# Patient Record
Sex: Male | Born: 1940 | Race: Black or African American | Hispanic: No | Smoking: Former smoker
Health system: Southern US, Community
[De-identification: ages and names within clinical notes are randomized; demographics above are authoritative.]

## PROBLEM LIST (undated history)

## (undated) DIAGNOSIS — Z9889 Other specified postprocedural states: Secondary | ICD-10-CM

## (undated) DIAGNOSIS — I509 Heart failure, unspecified: Secondary | ICD-10-CM

## (undated) DIAGNOSIS — Z95810 Presence of automatic (implantable) cardiac defibrillator: Secondary | ICD-10-CM

## (undated) DIAGNOSIS — N281 Cyst of kidney, acquired: Secondary | ICD-10-CM

## (undated) DIAGNOSIS — R112 Nausea with vomiting, unspecified: Secondary | ICD-10-CM

## (undated) DIAGNOSIS — M109 Gout, unspecified: Secondary | ICD-10-CM

## (undated) DIAGNOSIS — F419 Anxiety disorder, unspecified: Secondary | ICD-10-CM

## (undated) DIAGNOSIS — E0590 Thyrotoxicosis, unspecified without thyrotoxic crisis or storm: Secondary | ICD-10-CM

## (undated) DIAGNOSIS — K760 Fatty (change of) liver, not elsewhere classified: Secondary | ICD-10-CM

## (undated) DIAGNOSIS — N289 Disorder of kidney and ureter, unspecified: Secondary | ICD-10-CM

## (undated) DIAGNOSIS — I10 Essential (primary) hypertension: Secondary | ICD-10-CM

## (undated) DIAGNOSIS — E785 Hyperlipidemia, unspecified: Secondary | ICD-10-CM

## (undated) HISTORY — DX: Thyrotoxicosis, unspecified without thyrotoxic crisis or storm: E05.90

## (undated) HISTORY — DX: Gout, unspecified: M10.9

## (undated) HISTORY — DX: Essential (primary) hypertension: I10

## (undated) HISTORY — PX: CARDIAC DEFIBRILLATOR PLACEMENT: SHX171

## (undated) HISTORY — DX: Hyperlipidemia, unspecified: E78.5

## (undated) HISTORY — DX: Cyst of kidney, acquired: N28.1

## (undated) HISTORY — PX: OTHER SURGICAL HISTORY: SHX169

## (undated) HISTORY — DX: Fatty (change of) liver, not elsewhere classified: K76.0

## (undated) HISTORY — DX: Disorder of kidney and ureter, unspecified: N28.9

---

## 2008-06-03 ENCOUNTER — Encounter: Payer: Self-pay | Admitting: Family Medicine

## 2008-06-07 ENCOUNTER — Encounter: Payer: Self-pay | Admitting: Family Medicine

## 2009-08-17 ENCOUNTER — Encounter: Payer: Self-pay | Admitting: Family Medicine

## 2009-08-17 LAB — CONVERTED CEMR LAB
ALT: 17 units/L
Alkaline Phosphatase: 87 units/L
Basophils Absolute: 0 10*3/uL
Creatinine, Ser: 1.92 mg/dL
Eosinophils Absolute: 0.2 10*3/uL
HCT: 41.3 %
Hemoglobin: 12.9 g/dL
Hgb A1c MFr Bld: 18.8 %
LDL Cholesterol: 147 mg/dL
Monocytes Absolute: 0.3 10*3/uL
PSA: 0.29 ng/mL
Platelets: 160 10*3/uL
Potassium: 4.5 meq/L
RBC: 4.89 M/uL
T3, Total: 2.2 ng/dL
TSH: 2.852 microintl units/mL
Total Bilirubin: 0.4 mg/dL
Total Protein: 8.4 g/dL
Uric Acid, Serum: 9.5 mg/dL
VLDL: 78 mg/dL
WBC: 5.2 10*3/uL

## 2009-10-10 ENCOUNTER — Encounter: Payer: Self-pay | Admitting: Family Medicine

## 2009-10-10 LAB — CONVERTED CEMR LAB
BUN: 64 mg/dL
Calcium: 9 mg/dL
Cholesterol: 183 mg/dL
Creatinine, Ser: 2.46 mg/dL
Glucose, Bld: 97 mg/dL
Hgb A1c MFr Bld: 12.3 %

## 2010-03-07 ENCOUNTER — Ambulatory Visit: Payer: Self-pay | Admitting: Family Medicine

## 2010-03-07 DIAGNOSIS — M109 Gout, unspecified: Secondary | ICD-10-CM | POA: Insufficient documentation

## 2010-03-07 DIAGNOSIS — E1122 Type 2 diabetes mellitus with diabetic chronic kidney disease: Secondary | ICD-10-CM | POA: Insufficient documentation

## 2010-03-07 DIAGNOSIS — E1169 Type 2 diabetes mellitus with other specified complication: Secondary | ICD-10-CM | POA: Insufficient documentation

## 2010-03-07 DIAGNOSIS — I10 Essential (primary) hypertension: Secondary | ICD-10-CM | POA: Insufficient documentation

## 2010-03-07 DIAGNOSIS — E1129 Type 2 diabetes mellitus with other diabetic kidney complication: Secondary | ICD-10-CM

## 2010-03-07 DIAGNOSIS — E785 Hyperlipidemia, unspecified: Secondary | ICD-10-CM | POA: Insufficient documentation

## 2010-03-07 DIAGNOSIS — J309 Allergic rhinitis, unspecified: Secondary | ICD-10-CM | POA: Insufficient documentation

## 2010-03-07 DIAGNOSIS — Z95810 Presence of automatic (implantable) cardiac defibrillator: Secondary | ICD-10-CM | POA: Insufficient documentation

## 2010-03-15 ENCOUNTER — Encounter: Payer: Self-pay | Admitting: Family Medicine

## 2010-03-30 ENCOUNTER — Encounter (INDEPENDENT_AMBULATORY_CARE_PROVIDER_SITE_OTHER): Payer: Self-pay | Admitting: *Deleted

## 2010-04-04 ENCOUNTER — Ambulatory Visit: Payer: Self-pay | Admitting: Family Medicine

## 2010-04-04 ENCOUNTER — Encounter: Payer: Self-pay | Admitting: Family Medicine

## 2010-04-04 DIAGNOSIS — N508 Other specified disorders of male genital organs: Secondary | ICD-10-CM | POA: Insufficient documentation

## 2010-04-04 DIAGNOSIS — N184 Chronic kidney disease, stage 4 (severe): Secondary | ICD-10-CM | POA: Insufficient documentation

## 2010-04-05 ENCOUNTER — Telehealth (INDEPENDENT_AMBULATORY_CARE_PROVIDER_SITE_OTHER): Payer: Self-pay | Admitting: *Deleted

## 2010-04-05 LAB — CONVERTED CEMR LAB
Basophils Absolute: 0 10*3/uL (ref 0.0–0.1)
Bilirubin, Direct: 0 mg/dL (ref 0.0–0.3)
CO2: 19 meq/L (ref 19–32)
Calcium: 9.3 mg/dL (ref 8.4–10.5)
Cholesterol: 168 mg/dL (ref 0–200)
Creatinine, Ser: 2.5 mg/dL — ABNORMAL HIGH (ref 0.4–1.5)
Eosinophils Absolute: 0.5 10*3/uL (ref 0.0–0.7)
GFR calc non Af Amer: 27.19 mL/min (ref >60)
Glucose, Bld: 180 mg/dL — ABNORMAL HIGH (ref 70–99)
HDL: 29.2 mg/dL — ABNORMAL LOW (ref >39.00)
Hgb A1c MFr Bld: 8.7 % — ABNORMAL HIGH (ref 4.6–6.5)
Iron: 66 ug/dL (ref 42–165)
LDL Cholesterol: 104 mg/dL — ABNORMAL HIGH (ref 0–99)
MCHC: 32.4 g/dL (ref 30.0–36.0)
MCV: 83.2 fL (ref 78.0–100.0)
Monocytes Absolute: 0.6 10*3/uL (ref 0.1–1.0)
Neutrophils Relative %: 62.8 % (ref 43.0–77.0)
PSA: 0.22 ng/mL (ref 0.10–4.00)
Platelets: 153 10*3/uL (ref 150.0–400.0)
Saturation Ratios: 21.6 % (ref 20.0–50.0)
Total Bilirubin: 0.4 mg/dL (ref 0.3–1.2)
Total CHOL/HDL Ratio: 6
Uric Acid, Serum: 11.3 mg/dL — ABNORMAL HIGH (ref 4.0–7.8)
VLDL: 34.4 mg/dL (ref 0.0–40.0)

## 2010-04-13 ENCOUNTER — Encounter: Payer: Self-pay | Admitting: Family Medicine

## 2010-05-16 ENCOUNTER — Encounter: Payer: Self-pay | Admitting: Family Medicine

## 2010-05-17 ENCOUNTER — Encounter: Payer: Self-pay | Admitting: Family Medicine

## 2010-06-07 ENCOUNTER — Encounter: Payer: Self-pay | Admitting: Internal Medicine

## 2010-06-07 ENCOUNTER — Ambulatory Visit: Payer: Self-pay | Admitting: Internal Medicine

## 2010-06-07 DIAGNOSIS — I5022 Chronic systolic (congestive) heart failure: Secondary | ICD-10-CM | POA: Insufficient documentation

## 2010-06-07 DIAGNOSIS — I429 Cardiomyopathy, unspecified: Secondary | ICD-10-CM | POA: Insufficient documentation

## 2010-06-08 ENCOUNTER — Telehealth (INDEPENDENT_AMBULATORY_CARE_PROVIDER_SITE_OTHER): Payer: Self-pay | Admitting: *Deleted

## 2010-07-05 ENCOUNTER — Ambulatory Visit: Admit: 2010-07-05 | Payer: Self-pay | Admitting: Family Medicine

## 2010-07-05 ENCOUNTER — Encounter (INDEPENDENT_AMBULATORY_CARE_PROVIDER_SITE_OTHER): Payer: Self-pay | Admitting: *Deleted

## 2010-07-06 ENCOUNTER — Encounter (INDEPENDENT_AMBULATORY_CARE_PROVIDER_SITE_OTHER): Payer: Self-pay | Admitting: *Deleted

## 2010-07-06 ENCOUNTER — Encounter: Payer: Self-pay | Admitting: Family Medicine

## 2010-07-06 LAB — CONVERTED CEMR LAB
BUN: 49 mg/dL
PTH: 87 pg/mL
Potassium, serum: 5.1 mmol/L
Sodium, serum: 134 mmol/L

## 2010-07-12 ENCOUNTER — Ambulatory Visit: Admit: 2010-07-12 | Payer: Self-pay | Admitting: Family Medicine

## 2010-07-17 NOTE — Progress Notes (Signed)
Summary: labs  Phone Note Outgoing Call   Call placed by: Malachi Bonds CMA,  April 05, 2010 2:42 PM Call placed to: Patient Summary of Call: A1C has improved from previous.  still not ideal, goal is <7.  needs to increase Lantus to 25 units two times a day.  can increase dose by 2 units every 2 days until fasting sugar is <120.  once sugar is <120 should maintain that dose.  Kidney fxn is stable.  If pt is taking Colcrys daily for gout should decrease to 1/2 tab daily.  if only taking as needed for sxs no adjustment is needed  pt w/ mild anemia- may be related to his poor kidney fxn.  please add iron panel to labs.  LDL goal is <70 given DM, should switch to Lipitor 40mg  in November when the medication goes generic.  can continue Simvastatin until then.  Follow-up for Phone Call        spoke w/ patient aware of labs and change in medication will mail copy of labs ........Marland KitchenMalachi Bonds CMA  April 05, 2010 2:44 PM

## 2010-07-17 NOTE — Assessment & Plan Note (Signed)
Summary: YEARLY EXAM  AND FASTING LABS///SPH  Flu Vaccine Consent Questions     Do you have a history of severe allergic reactions to this vaccine? no    Any prior history of allergic reactions to egg and/or gelatin? no    Do you have a sensitivity to the preservative Thimersol? no    Do you have a past history of Guillan-Barre Syndrome? no    Do you currently have an acute febrile illness? no    Have you ever had a severe reaction to latex? no    Vaccine information given and explained to patient? yes    Are you currently pregnant? no    Lot Number:[id number]   Exp Date:12/15/2010   Site Given  Right Deltoid IM    Vital Signs:  Patient profile:   70 year old male Height:      68 inches Weight:      191 pounds BMI:     29.15 Pulse rate:   64 / minute BP sitting:   112 / 64  (right arm)  Vitals Entered By: Malachi Bonds CMA (April 04, 2010 8:05 AM) CC: YEARLY EXAM AND LABS   History of Present Illness: 70 yo man here today for CPE.  Here for Medicare AWV:  1.   Risk factors based on Past M, S, F history: DM- last A1C from 4/11 was 12.3.  currently on Januvia (reduced dose due to kidney failure) and Lantus.   has upcoming appt w/ Dr Bing Plume.  CBG last night was 108, yesterday AM was 98.  denies symptomatic lows.  no CP, SOB, HAs, visual changes, edema, has intermittant numbness of L pinky finger. renal insufficiency- last Cr was 2.46.  on ACE.  has never seen renal- this was discussed but then pt moved. HTN- well controlled.  on Lisinopril, Torsemide, Atenolol.  asymptomatic Hyperlipidemia- on Simvastatin 40mg .  due for labs.  no N/V. abd pain, myalgias  2.   Physical Activities: walking regularly 3.   Depression/mood: reports mood is still good, 'i get down some days' (wife recently had stroke) 4.   Hearing: normal to whispered voice at 6 ft 5.   ADL's: independent 6.   Fall Risk: low risk, gait normal 7.   Home Safety: safe at home, lives w/ son 25.   Height, weight,  &visual acuity: see vitals, vision corrected to 20/20 w/ glasses 9.   Counseling: provided on health maintainence, exercise, ADA diet 10.   Labs ordered based on risk factors:  11.           Referral Coordination: UTD on colonoscopy, declines a Pneumovax, would like a flu shot 12.           Care Plan: see A&P 13.           Cognitive Assessment: normal thought process, memory intact, normally interactive  Preventive Screening-Counseling & Management  Alcohol-Tobacco     Alcohol drinks/day: 0     Smoking Status: quit  Caffeine-Diet-Exercise     Does Patient Exercise: no      Sexual History:  currently monogamous.        Drug Use:  never.    Current Medications (verified): 1)  Flonase 50 Mcg/act Susp (Fluticasone Propionate) .... As Directed 2)  Torsemide 20 Mg Tabs (Torsemide) .Marland Kitchen.. 1 By Mouth Qd 3)  Viagra 100 Mg Tabs (Sildenafil Citrate) .Marland Kitchen.. 1 By Mouth 1 Hr Before Sex As Needed 4)  Extra Strength Acetaminophen 500 Mg Caps (Acetaminophen) .... Prn  5)  Lisinopril 10 Mg Tabs (Lisinopril) .Marland Kitchen.. 1 By Mouth Qd 6)  Colcrys 0.6 Mg Tabs (Colchicine) .... 2 By Mouth At Onset of Gout Attack 7)  Atenolol 50 Mg Tabs (Atenolol) .... 1.5 Tabs By Mouth Qd 8)  Januvia 50 Mg Tabs (Sitagliptin Phosphate) .Marland Kitchen.. 1 By Mouth Qd 9)  Simvastatin 40 Mg Tabs (Simvastatin) .Marland Kitchen.. 1 By Mouth Qhs 10)  Freestyle Lancets  Misc (Lancets) .... Use As Directed Two Times A Day 11)  Lantus Solostar 100 Unit/ml Soln (Insulin Glargine) .... 23u Qam and 23u Qpm  Allergies (verified): 1)  ! Sulfa  Past History:  Past Medical History: DM HTN Hyperlipidemia Gout Pacemaker ED Renal Insufficiency PMH-FH-SH reviewed-no changes except otherwise noted  Social History: Sexual History:  currently monogamous Drug Use:  never  Review of Systems       The patient complains of anorexia.  The patient denies fever, weight loss, weight gain, vision loss, decreased hearing, hoarseness, chest pain, syncope, dyspnea on  exertion, peripheral edema, prolonged cough, headaches, abdominal pain, melena, hematochezia, severe indigestion/heartburn, hematuria, suspicious skin lesions, depression, abnormal bleeding, enlarged lymph nodes, and testicular masses.         anorexia due to stress  Physical Exam  General:  Well-developed,well-nourished,in no acute distress; alert,appropriate and cooperative throughout examination Head:  NCAT Eyes:  PERRL, EOMI, fundoscopic exam deferred to ophtho Ears:  External ear exam shows no significant lesions or deformities.  Otoscopic examination reveals clear canals, tympanic membranes are intact bilaterally without bulging, retraction, inflammation or discharge. Hearing is grossly normal bilaterally. Nose:  mild congestion Mouth:  Oral mucosa and oropharynx without lesions or exudates. Neck:  No deformities, masses, or tenderness noted. Lungs:  Normal respiratory effort, chest expands symmetrically. Lungs are clear to auscultation, no crackles or wheezes. Heart:  reg S1/S2 Abdomen:  soft, NT/ND, +BS Rectal:  No external abnormalities noted. Normal sphincter tone. No rectal masses or tenderness. Genitalia:  uncircumcised and no cutaneous lesions.  large R testicular mass, firm, nontender.  pt reports this has been present for 'years'.  has never had this evaluated. Prostate:  mildly enlarged but smooth Pulses:  +2 radial, DP/PT Extremities:  No clubbing, cyanosis, edema, or deformity noted with normal full range of motion of all joints.   Neurologic:  No cranial nerve deficits noted. Station and gait are normal. Plantar reflexes are down-going bilaterally. DTRs are symmetrical throughout. Sensory, motor and coordinative functions appear intact. Skin:  Intact without suspicious lesions or rashes Cervical Nodes:  No lymphadenopathy noted Inguinal Nodes:  No significant adenopathy Psych:  Cognition and judgment appear intact. Alert and cooperative with normal attention span and  concentration. No apparent delusions, illusions, hallucinations  Diabetes Management Exam:    Foot Exam (with socks and/or shoes not present):       Sensory-Pinprick/Light touch:          Left medial foot (L-4): normal          Left dorsal foot (L-5): normal          Left lateral foot (S-1): normal          Right medial foot (L-4): normal          Right dorsal foot (L-5): normal          Right lateral foot (S-1): normal       Sensory-Monofilament:          Left foot: normal          Right foot: normal  Inspection:          Left foot: normal          Right foot: normal       Nails:          Left foot: too long          Right foot: too long   Impression & Recommendations:  Problem # 1:  PHYSICAL EXAMINATION (ICD-V70.0) Assessment New pt's PE WNL w/ exception of testicular mass (see below).  UTD on colonoscopy.  refusing Pneumovax.  flu shot given. Orders: TLB-PSA (Prostate Specific Antigen) (84153-PSA) Specimen Handling (99000) Medicare -1st Annual Wellness Visit [phone]) DRE [id number]) Prostate / PSA (Medicare) (G0103)  Problem # 2:  DIABETES-TYPE 2 (ICD-250.00) Assessment: Unchanged according to labs received from pt's previous MD this is poorly controlled.  will check labs today and adjust meds as needed.  has upcoming eye exam. His updated medication list for this problem includes:    Lisinopril 10 Mg Tabs (Lisinopril) .Marland Kitchen... 1 by mouth qd    Januvia 50 Mg Tabs (Sitagliptin phosphate) .Marland Kitchen... 1 by mouth qd    Lantus Solostar 100 Unit/ml Soln (Insulin glargine) .Marland Kitchen... 23u qam and 23u qpm  Orders: Venipuncture [id number]) TLB-A1C / Hgb A1C (Glycohemoglobin) (83036-A1C) TLB-BMP (Basic Metabolic Panel-BMET) (99991111) TLB-TSH (Thyroid Stimulating Hormone) (84443-TSH) Specimen Handling (99000)  Problem # 3:  RENAL INSUFFICIENCY (ICD-588.9) Assessment: New pt's Cr from last labs were 2.46.  had discussed seeing renal w/ his previous MD but then wife had stroke and 'things  fell through'.  will check labs and refer. Orders: Nephrology Referral (Nephro)  Problem # 4:  HYPERTENSION, BENIGN ESSENTIAL (ICD-401.1) Assessment: Unchanged well controlled.  asymptomatic.  no changes at this time. His updated medication list for this problem includes:    Torsemide 20 Mg Tabs (Torsemide) .Marland Kitchen... 1 by mouth qd    Lisinopril 10 Mg Tabs (Lisinopril) .Marland Kitchen... 1 by mouth qd    Atenolol 50 Mg Tabs (Atenolol) .Marland Kitchen... 1.5 tabs by mouth qd  Orders: TLB-CBC Platelet - w/Differential (85025-CBCD)  Problem # 5:  HYPERLIPIDEMIA (ICD-272.4) Assessment: Unchanged tolerating statin.  check labs and make adjustments as needed. His updated medication list for this problem includes:    Simvastatin 40 Mg Tabs (Simvastatin) .Marland Kitchen... 1 by mouth qhs  Orders: TLB-Lipid Panel (80061-LIPID) TLB-Hepatic/Liver Function Pnl (80076-HEPATIC) Specimen Handling (99000)  Problem # 6:  TESTICULAR MASS [id number]) Assessment: New pt reports this has been present for 'years'.  has never had w/u.  will refer to urology. Orders: Urology Referral (Urology)  Complete Medication List: 1)  Flonase 50 Mcg/act Susp (Fluticasone propionate) .... As directed 2)  Torsemide 20 Mg Tabs (Torsemide) .Marland Kitchen.. 1 by mouth qd 3)  Viagra 100 Mg Tabs (Sildenafil citrate) .Marland Kitchen.. 1 by mouth 1 hr before sex as needed 4)  Extra Strength Acetaminophen 500 Mg Caps (Acetaminophen) .... Prn 5)  Lisinopril 10 Mg Tabs (Lisinopril) .Marland Kitchen.. 1 by mouth qd 6)  Colcrys 0.6 Mg Tabs (Colchicine) .... 2 by mouth at onset of gout attack 7)  Atenolol 50 Mg Tabs (Atenolol) .... 1.5 tabs by mouth qd 8)  Januvia 50 Mg Tabs (Sitagliptin phosphate) .Marland Kitchen.. 1 by mouth qd 9)  Simvastatin 40 Mg Tabs (Simvastatin) .Marland Kitchen.. 1 by mouth qhs 10)  Freestyle Lancets Misc (Lancets) .... Use as directed two times a day 11)  Lantus Solostar 100 Unit/ml Soln (Insulin glargine) .... 23u qam and 23u qpm  Other Orders: TLB-Uric Acid, Blood (84550-URIC) Flu Vaccine 48yrs +  MEDICARE PATIENTS [id number]) Administration  Flu vaccine - MCR [phone])  Patient Instructions: 1)  Follow up in 3 months for your regular diabetes check 2)  We'll notify you of your lab results 3)  Someone will call you with your Urology appt to evaluate the lump in your testicle 4)  We'll set up an appt with the Kidney doctors for you since this was talked about in the past 5)  We'll make any adjustments to your medicines based on your labs 6)  Call with any questions or concerns 7)  Hang in there!!!   Orders Added: 1)  Venipuncture [id number] 2)  TLB-A1C / Hgb A1C (Glycohemoglobin) [83036-A1C] 3)  TLB-BMP (Basic Metabolic Panel-BMET) 123456 4)  TLB-TSH (Thyroid Stimulating Hormone) [84443-TSH] 5)  TLB-CBC Platelet - w/Differential [85025-CBCD] 6)  TLB-Lipid Panel [80061-LIPID] 7)  TLB-Hepatic/Liver Function Pnl [80076-HEPATIC] 8)  TLB-PSA (Prostate Specific Antigen) [84153-PSA] 9)  TLB-Uric Acid, Blood [84550-URIC] 10)  Urology Referral [Urology] 11)  Nephrology Referral [Nephro] 12)  Flu Vaccine 22yrs + MEDICARE PATIENTS [Q2039] 13)  Administration Flu vaccine - MCR [id number] 14)  Specimen Handling [99000] 15)  Medicare -1st Annual Wellness Visit [id number] 16)  Est. Patient Level IV [id number] 17)  DRE [id number] 18)  Prostate / PSA (Medicare) [id number]

## 2010-07-17 NOTE — Assessment & Plan Note (Signed)
Summary: NEW PT TO ESTAB///SPH   Vital Signs:  Patient profile:   69 year old male Height:      68 inches (172.72 cm) Weight:      196.38 pounds (89.26 kg) BMI:     29.97 Temp:     97.3 degrees F (36.28 degrees C) oral BP sitting:   126 / 70  (left arm) Cuff size:   regular  Vitals Entered By: Ernestene Mention CMA (March 07, 2010 10:22 AM) CC: NP est care, needs cpx./kb Is Patient Diabetic? Yes Pain Assessment Patient in pain? no        History of Present Illness: 70 yo man here today to establish care.  previous MD- Dr Hilton Sinclair, [place], [place].  recently moved in w/ son, [name].  DM- taking Januvia and Lantus.  A1C last checked 'awhile ago'.  overdue for eye exam.  would like referral.  used to have symptomatic lows but in the last month pt reports CBGs in the 120s.  no CP, SOB, HAs, visual changes, edema, N/V, abd pain.  HTN- on Lisinopril, atenolol, torsemide.  asymptomatic.  adequate control.  Pacemaker- had his pacer interrogated in [place] yesterday.  needs to establish w/ local cards.  Hyperlipidemia- tolerating simvastatin w/out difficulty.  no myalgias.  needs refill.  Seasonal allergies- using Flonase regularly, sxs well controlled.  Health Maintainence- Colonoscopy 2 yrs ago (Dr Hoover Browns), overdue for PSA  Preventive Screening-Counseling & Management  Alcohol-Tobacco     Alcohol drinks/day: <1     Smoking Status: quit  Caffeine-Diet-Exercise     Does Patient Exercise: no      Drug Use:  never and no.    Current Medications (verified): 1)  Flonase 50 Mcg/act Susp (Fluticasone Propionate) .... As Directed 2)  Torsemide 20 Mg Tabs (Torsemide) .Marland Kitchen.. 1 By Mouth Qd 3)  Viagra 100 Mg Tabs (Sildenafil Citrate) .Marland Kitchen.. 1 By Mouth 1 Hr Before Sex As Needed 4)  Extra Strength Acetaminophen 500 Mg Caps (Acetaminophen) .... Prn 5)  Lisinopril 10 Mg Tabs (Lisinopril) .Marland Kitchen.. 1 By Mouth Qd 6)  Colcrys 0.6 Mg Tabs (Colchicine) .... 2 By Mouth At Onset of Gout Attack 7)   Atenolol 50 Mg Tabs (Atenolol) .... 1.5 Tabs By Mouth Qd 8)  Januvia 50 Mg Tabs (Sitagliptin Phosphate) .Marland Kitchen.. 1 By Mouth Qd 9)  Simvastatin 40 Mg Tabs (Simvastatin) .Marland Kitchen.. 1 By Mouth Qhs 10)  Freestyle Lancets  Misc (Lancets) .... Use As Directed Two Times A Day 11)  Lantus Solostar 100 Unit/ml Soln (Insulin Glargine) .... 23u Qam and 23u Qpm  Allergies (verified): 1)  ! Sulfa  Past History:  Past Medical History: DM HTN Hyperlipidemia Gout Pacemaker ED  Past Surgical History: Pacemaker/Defib. Placement --2009  Family History: Family History Diabetes 1st degree relative--father Family History Hypertension--father  Social History: Retired Married- wife had stroke 2011 Former Smoker--quit 10+ yrs ago Alcohol use-no Drug use-no Smoking Status:  quit Drug Use:  never, no Does Patient Exercise:  no  Review of Systems      See HPI  Physical Exam  General:  Well-developed,well-nourished,in no acute distress; alert,appropriate and cooperative throughout examination Head:  NCAT Eyes:  PERRL, EOMI Neck:  No deformities, masses, or tenderness noted. Lungs:  Normal respiratory effort, chest expands symmetrically. Lungs are clear to auscultation, no crackles or wheezes. Heart:  reg S1/S2 Abdomen:  soft, NT/ND, +BS Pulses:  +2 radial, DP/PT Extremities:  no C/C/E Neurologic:  alert & oriented X3, cranial nerves II-XII intact, and gait normal.  Cervical Nodes:  No lymphadenopathy noted Psych:  Cognition and judgment appear intact. Alert and cooperative with normal attention span and concentration. No apparent delusions, illusions, hallucinations   Impression & Recommendations:  Problem # 1:  DIABETES-TYPE 2 (ICD-250.00) Assessment New will need pt's records to review previous level of control.  will plan on getting labs at upcoming CPE and adjusting meds as needed.  will refer for eye exam.  already on ACE. His updated medication list for this problem includes:     Lisinopril 10 Mg Tabs (Lisinopril) .Marland Kitchen... 1 by mouth qd    Januvia 50 Mg Tabs (Sitagliptin phosphate) .Marland Kitchen... 1 by mouth qd    Lantus Solostar 100 Unit/ml Soln (Insulin glargine) .Marland Kitchen... 23u qam and 23u qpm  Orders: Ophthalmology Referral (Ophthalmology)  Problem # 2:  HYPERTENSION, BENIGN ESSENTIAL (ICD-401.1) Assessment: Unchanged adequate control.  asymptomatic.  continue current meds. His updated medication list for this problem includes:    Torsemide 20 Mg Tabs (Torsemide) .Marland Kitchen... 1 by mouth qd    Lisinopril 10 Mg Tabs (Lisinopril) .Marland Kitchen... 1 by mouth qd    Atenolol 50 Mg Tabs (Atenolol) .Marland Kitchen... 1.5 tabs by mouth qd  Problem # 3:  HYPERLIPIDEMIA (ICD-272.4) Assessment: New LDL goal is <70 given dx of DM.  tolerating statin w/out difficulty.  likely due for labs, not fasting today.  will have pt return for CPE and adjust meds as needed. His updated medication list for this problem includes:    Simvastatin 40 Mg Tabs (Simvastatin) .Marland Kitchen... 1 by mouth qhs  Problem # 4:  PACEMAKER, PERMANENT (ICD-V45.01) Assessment: New pt's pacer was interrogated yesterday in [place].  would like a local cardiologist to assume care. Orders: Cardiology Referral (Cardiology)  Problem # 5:  RHINITIS (ICD-477.9) Assessment: New sxs currently well controlled.  continue flonase. His updated medication list for this problem includes:    Flonase 50 Mcg/act Susp (Fluticasone propionate) .Marland Kitchen... As directed  Complete Medication List: 1)  Flonase 50 Mcg/act Susp (Fluticasone propionate) .... As directed 2)  Torsemide 20 Mg Tabs (Torsemide) .Marland Kitchen.. 1 by mouth qd 3)  Viagra 100 Mg Tabs (Sildenafil citrate) .Marland Kitchen.. 1 by mouth 1 hr before sex as needed 4)  Extra Strength Acetaminophen 500 Mg Caps (Acetaminophen) .... Prn 5)  Lisinopril 10 Mg Tabs (Lisinopril) .Marland Kitchen.. 1 by mouth qd 6)  Colcrys 0.6 Mg Tabs (Colchicine) .... 2 by mouth at onset of gout attack 7)  Atenolol 50 Mg Tabs (Atenolol) .... 1.5 tabs by mouth qd 8)  Januvia  50 Mg Tabs (Sitagliptin phosphate) .Marland Kitchen.. 1 by mouth qd 9)  Simvastatin 40 Mg Tabs (Simvastatin) .Marland Kitchen.. 1 by mouth qhs 10)  Freestyle Lancets Misc (Lancets) .... Use as directed two times a day 11)  Lantus Solostar 100 Unit/ml Soln (Insulin glargine) .... 23u qam and 23u qpm  Patient Instructions: 1)  Please schedule your complete physical at your convenience- do not eat before this appt 2)  We'll review your records and figure out where we need to go 3)  Call with any questions or concerns 4)  Someone will call you w/ the cardiology appt 5)  Welcome!  We're glad to have you! Prescriptions: SIMVASTATIN 40 MG TABS (SIMVASTATIN) 1 by mouth qhs  #30 x 3   Entered and Authorized by:   [name] MD   Signed by:   [name] MD on 03/07/2010   Method used:   Electronically to        [facility] (retail)  [address]       [place], [place]  [address]       Ph: [phone]       Fax: [phone]   RxID:   [id number]   Preventive Care Screening  Colonoscopy:    Date:  06/18/2007    Results:  polyps     Not Administered:    Influenza Vaccine 1 not given due to: declined  Clinical Lists Changes  Problems: Added new problem of FAMILY HISTORY DIABETES 1ST DEGREE RELATIVE (ICD-V18.0) - Signed Added new problem of DIABETES-TYPE 2 (ICD-250.00) - Signed Added new problem of PACEMAKER, PERMANENT (ICD-V45.01) - Signed Added new problem of HYPERTENSION, BENIGN ESSENTIAL (ICD-401.1) Added new problem of HYPERLIPIDEMIA (ICD-272.4) Added new problem of GOUT (ICD-274.9) Added new problem of RHINITIS (ICD-477.9) Assessed DIABETES-TYPE 2 as comment only -  His updated medication list for this problem includes:    Lisinopril 10 Mg Tabs (Lisinopril) .Marland Kitchen... 1 by mouth qd    Januvia 50 Mg Tabs (Sitagliptin phosphate) .Marland Kitchen... 1 by mouth qd    Lantus Solostar 100 Unit/ml Soln (Insulin glargine) .Marland Kitchen... 23u qam and 23u qpm  Orders: Ophthalmology  Referral (Ophthalmology)  - Signed Assessed HYPERTENSION, BENIGN ESSENTIAL as unchanged - adequate control.  asymptomatic.  continue current meds. His updated medication list for this problem includes:    Torsemide 20 Mg Tabs (Torsemide) .Marland Kitchen... 1 by mouth qd    Lisinopril 10 Mg Tabs (Lisinopril) .Marland Kitchen... 1 by mouth qd    Atenolol 50 Mg Tabs (Atenolol) .Marland Kitchen... 1.5 tabs by mouth qd  Assessed HYPERLIPIDEMIA as new - LDL goal is <70 given dx of DM.  tolerating statin w/out difficulty.  likely due for labs, not fasting today.  will have pt return for CPE and adjust meds as needed. His updated medication list for this problem includes:    Simvastatin 40 Mg Tabs (Simvastatin) .Marland Kitchen... 1 by mouth qhs  Assessed RHINITIS as new - sxs currently well controlled.  continue flonase. His updated medication list for this problem includes:    Flonase 50 Mcg/act Susp (Fluticasone propionate) .Marland Kitchen... As directed  Medications: Added new medication of FLONASE 50 MCG/ACT SUSP (FLUTICASONE PROPIONATE) as directed - Signed Added new medication of TORSEMIDE 20 MG TABS (TORSEMIDE) 1 by mouth qd - Signed Added new medication of VIAGRA 100 MG TABS (SILDENAFIL CITRATE) 1 by mouth 1 hr before sex as needed - Signed Added new medication of EXTRA STRENGTH ACETAMINOPHEN 500 MG CAPS (ACETAMINOPHEN) prn - Signed Added new medication of LISINOPRIL 10 MG TABS (LISINOPRIL) 1 by mouth qd - Signed Added new medication of COLCRYS 0.6 MG TABS (COLCHICINE) 2 by mouth at onset of gout attack - Signed Added new medication of ATENOLOL 50 MG TABS (ATENOLOL) 1.5 tabs by mouth qd - Signed Added new medication of JANUVIA 50 MG TABS (SITAGLIPTIN PHOSPHATE) 1 by mouth qd - Signed Added new medication of SIMVASTATIN 40 MG TABS (SIMVASTATIN) 1 by mouth qhs - Signed Added new medication of FREESTYLE LANCETS  MISC (LANCETS) use as directed two times a day - Signed Added new medication of LANTUS SOLOSTAR 100 UNIT/ML SOLN (INSULIN GLARGINE) 23u qAM and 23u  qPM - Signed Rx of SIMVASTATIN 40 MG TABS (SIMVASTATIN) 1 by mouth qhs;  #30 x 3;  Signed;  Entered by: [name] MD;  Authorized by: [name] MD;  Method used: Electronically to [facility] [address], [place], [place], [place]  [address], Ph:  [phone], Fax: [phone] Orders: Added new Referral order of Ophthalmology Referral (Ophthalmology) - Signed Added new Referral order of Cardiology Referral (Cardiology) - Signed Added new Service order of New Patient Level III [phone]) - Signed Allergies: Added new allergy or adverse reaction of SULFA - Signed Observations: Added new observation of PSYCH COMM: Cognition and judgment appear intact. Alert and cooperative with normal attention span and concentration. No apparent delusions, illusions, hallucinations (03/07/2010 9:57) Added new observation of CERVIC NODES: No lymphadenopathy noted (03/07/2010 9:57) Added new observation of NEURO EXAM: alert & oriented X3, cranial nerves II-XII intact, and gait normal.   (03/07/2010 9:57) Added new observation of EXTREMITIES: no C/C/E (03/07/2010 9:57) Added new observation of PEDAL PULSE: +2 radial, DP/PT (03/07/2010 9:57) Added new observation of ABDOMEN EXAM: soft, NT/ND, +BS (03/07/2010 9:57) Added new observation of HEART EXAM: reg S1/S2 (03/07/2010 9:57) Added new observation of LUNG EXAM: Normal respiratory effort, chest expands symmetrically. Lungs are clear to auscultation, no crackles or wheezes. (03/07/2010 9:57) Added new observation of NECK EXAM: No deformities, masses, or tenderness noted. (03/07/2010 9:57) Added new observation of EYE EXAM: PERRL, EOMI (03/07/2010 9:57) Added new observation of HD/FACE INSP: NCAT (03/07/2010 9:57) Added new observation of PEADULT: [name] MD ~General`Gen appear ~Head`hd/face insp ~Eyes`Eye exam ~Neck`NECK EXAM ~Lungs`lung exam ~Heart`Heart exam ~Abdomen`Abdomen exam ~Pulses`pedal pulse ~Extremities`Extremities  ~Neurologic`Neuro exam ~Cervical Nodes`cervic nodes ~Psych`psych comm (03/07/2010 9:57) Added new observation of GEN APPEAR: Well-developed,well-nourished,in no acute distress; alert,appropriate and cooperative throughout examination (03/07/2010 9:57) Added new observation of PAST MED HX: DM HTN Hyperlipidemia Gout Pacemaker ED (03/07/2010 9:57) Added new observation of ALCOHOL USE: <1 (03/07/2010 9:57) Added new observation of REGULAREXERC: no (03/07/2010 9:57) Added new observation of INSTRUCTIONS: Please schedule your complete physical at your convenience- do not eat before this appt We'll review your records and figure out where we need to go Call with any questions or concerns Someone will call you w/ the cardiology appt Welcome!  We're glad to have you! (03/07/2010 9:57) Added new observation of HPI: 70 yo man here today to establish care.  previous MD- Dr Hilton Sinclair, [place], [place].  recently moved in w/ son, [name].  DM- taking Januvia and Lantus.  A1C last checked 'awhile ago'.  overdue for eye exam.  would like referral.  used to have symptomatic lows but in the last month pt reports CBGs in the 120s.  no CP, SOB, HAs, visual changes, edema, N/V, abd pain.  HTN- on Lisinopril, atenolol, torsemide.  asymptomatic.  adequate control.  Pacemaker- had his pacer interrogated in [place] yesterday.  needs to establish w/ local cards.  Hyperlipidemia- tolerating simvastatin w/out difficulty.  no myalgias.  needs refill.  Seasonal allergies- using Flonase regularly, sxs well controlled.  Health Maintainence- Colonoscopy 2 yrs ago (Dr Hoover Browns), overdue for PSA (03/07/2010 9:57) Added new observation of MEDRECON: current updated (03/07/2010 9:57) Added new observation of ALLERGY REV: Done (03/07/2010 9:57) Added new observation of NKA: F (03/07/2010 9:57) Added new observation of SOCIAL HX: Retired Married- wife had stroke 2011 Former Smoker--quit 10+ yrs ago Alcohol use-no Drug  use-no  (03/07/2010 9:57) Added new observation of DRUG USE: never, no (03/07/2010 9:57) Added new observation of ALCOHOL COM: no (03/07/2010 9:57) Added new observation of SMOK STATUS: quit (03/07/2010 9:57) Added new observation of MARITAL STAT: Married (03/07/2010 9:57) Added new observation of WORK STATUS: Retired (03/07/2010 9:57) Added new observation of FAMILY HX: Family History Diabetes 1st degree relative--father Family History Hypertension--father  (03/07/2010 9:57) Added new observation of  FH HTN: Family History Hypertension (03/07/2010 9:57) Added new observation of FH DIABETES: Family History Diabetes 1st degree relative (03/07/2010 9:57) Added new observation of PAST SURG HX: Pacemaker/Defib. Placement --2009 (03/07/2010 9:57) Added new observation of CHIEF CMPLNT: NP est care, needs cpx./kb (03/07/2010 9:57) Added new observation of TEMP(DEG C): 36.28 deg C (03/07/2010 9:57) Added new observation of WEIGHT (KG): 89.26 kg (03/07/2010 9:57) Added new observation of HEIGHT (CM): 172.72 cm (03/07/2010 9:57) Added new observation of BMI: 29.97  (03/07/2010 9:57) Added new observation of BP CUFF SIZE: regular  (03/07/2010 9:57) Added new observation of BP DIASTOLIC: 70 mmHg ([id number] 9:57) Added new observation of BP SYSTOLIC: 123XX123 mmHg ([id number] 9:57) Added new observation of TEMP SITE: oral  (03/07/2010 9:57) Added new observation of TEMPERATURE: 97.3 deg F (03/07/2010 9:57) Added new observation of WEIGHT: 196.38 lb (03/07/2010 9:57) Added new observation of HEIGHT: 68 in (03/07/2010 9:57) Added new observation of COLONOSCOPY: polyps  (06/18/2007 10:25)   Clinical Lists Changes  Problems: Added new problem of FAMILY HISTORY DIABETES 1ST DEGREE RELATIVE (ICD-V18.0) - Signed Added new problem of DIABETES-TYPE 2 (ICD-250.00) - Signed Added new problem of PACEMAKER, PERMANENT (ICD-V45.01) - Signed Added new problem of HYPERTENSION, BENIGN ESSENTIAL (ICD-401.1) Added new  problem of HYPERLIPIDEMIA (ICD-272.4) Added new problem of GOUT (ICD-274.9) Added new problem of RHINITIS (ICD-477.9) Assessed DIABETES-TYPE 2 as comment only -  His updated medication list for this problem includes:    Lisinopril 10 Mg Tabs (Lisinopril) .Marland Kitchen... 1 by mouth qd    Januvia 50 Mg Tabs (Sitagliptin phosphate) .Marland Kitchen... 1 by mouth qd    Lantus Solostar 100 Unit/ml Soln (Insulin glargine) .Marland Kitchen... 23u qam and 23u qpm  Orders: Ophthalmology Referral (Ophthalmology)  - Signed Assessed HYPERTENSION, BENIGN ESSENTIAL as unchanged - adequate control.  asymptomatic.  continue current meds. His updated medication list for this problem includes:    Torsemide 20 Mg Tabs (Torsemide) .Marland Kitchen... 1 by mouth qd    Lisinopril 10 Mg Tabs (Lisinopril) .Marland Kitchen... 1 by mouth qd    Atenolol 50 Mg Tabs (Atenolol) .Marland Kitchen... 1.5 tabs by mouth qd  Assessed HYPERLIPIDEMIA as new - LDL goal is <70 given dx of DM.  tolerating statin w/out difficulty.  likely due for labs, not fasting today.  will have pt return for CPE and adjust meds as needed. His updated medication list for this problem includes:    Simvastatin 40 Mg Tabs (Simvastatin) .Marland Kitchen... 1 by mouth qhs  Assessed RHINITIS as new - sxs currently well controlled.  continue flonase. His updated medication list for this problem includes:    Flonase 50 Mcg/act Susp (Fluticasone propionate) .Marland Kitchen... As directed  Medications: Added new medication of FLONASE 50 MCG/ACT SUSP (FLUTICASONE PROPIONATE) as directed - Signed Added new medication of TORSEMIDE 20 MG TABS (TORSEMIDE) 1 by mouth qd - Signed Added new medication of VIAGRA 100 MG TABS (SILDENAFIL CITRATE) 1 by mouth 1 hr before sex as needed - Signed Added new medication of EXTRA STRENGTH ACETAMINOPHEN 500 MG CAPS (ACETAMINOPHEN) prn - Signed Added new medication of LISINOPRIL 10 MG TABS (LISINOPRIL) 1 by mouth qd - Signed Added new medication of COLCRYS 0.6 MG TABS (COLCHICINE) 2 by mouth at onset of gout attack -  Signed Added new medication of ATENOLOL 50 MG TABS (ATENOLOL) 1.5 tabs by mouth qd - Signed Added new medication of JANUVIA 50 MG TABS (SITAGLIPTIN PHOSPHATE) 1 by mouth qd - Signed Added new medication of SIMVASTATIN 40 MG TABS (SIMVASTATIN) 1 by mouth qhs - Signed  Added new medication of FREESTYLE LANCETS  MISC (LANCETS) use as directed two times a day - Signed Added new medication of LANTUS SOLOSTAR 100 UNIT/ML SOLN (INSULIN GLARGINE) 23u qAM and 23u qPM - Signed Rx of SIMVASTATIN 40 MG TABS (SIMVASTATIN) 1 by mouth qhs;  #30 x 3;  Signed;  Entered by: [name] MD;  Authorized by: [name] MD;  Method used: Electronically to [facility] [address] [place], [place],   [address], Ph: [phone], Fax: [phone] Orders: Added new Referral order of Ophthalmology Referral (Ophthalmology) - Signed Added new Referral order of Cardiology Referral (Cardiology) - Signed Added new Service order of New Patient Level III [phone]) - Signed Allergies: Added new allergy or adverse reaction of SULFA - Signed Observations: Added new observation of PSYCH COMM: Cognition and judgment appear intact. Alert and cooperative with normal attention span and concentration. No apparent delusions, illusions, hallucinations (03/07/2010 9:57) Added new observation of CERVIC NODES: No lymphadenopathy noted (03/07/2010 9:57) Added new observation of NEURO EXAM: alert & oriented X3, cranial nerves II-XII intact, and gait normal.   (03/07/2010 9:57) Added new observation of EXTREMITIES: no C/C/E (03/07/2010 9:57) Added new observation of PEDAL PULSE: +2 radial, DP/PT (03/07/2010 9:57) Added new observation of ABDOMEN EXAM: soft, NT/ND, +BS (03/07/2010 9:57) Added new observation of HEART EXAM: reg S1/S2 (03/07/2010 9:57) Added new observation of LUNG EXAM: Normal respiratory effort, chest expands symmetrically. Lungs are clear to auscultation, no crackles or wheezes. (03/07/2010  9:57) Added new observation of NECK EXAM: No deformities, masses, or tenderness noted. (03/07/2010 9:57) Added new observation of EYE EXAM: PERRL, EOMI (03/07/2010 9:57) Added new observation of HD/FACE INSP: NCAT (03/07/2010 9:57) Added new observation of PEADULT: [name] MD ~General`Gen appear ~Head`hd/face insp ~Eyes`Eye exam ~Neck`NECK EXAM ~Lungs`lung exam ~Heart`Heart exam ~Abdomen`Abdomen exam ~Pulses`pedal pulse ~Extremities`Extremities ~Neurologic`Neuro exam ~Cervical Nodes`cervic nodes ~Psych`psych comm (03/07/2010 9:57) Added new observation of GEN APPEAR: Well-developed,well-nourished,in no acute distress; alert,appropriate and cooperative throughout examination (03/07/2010 9:57) Added new observation of PAST MED HX: DM HTN Hyperlipidemia Gout Pacemaker ED (03/07/2010 9:57) Added new observation of ALCOHOL USE: <1 (03/07/2010 9:57) Added new observation of REGULAREXERC: no (03/07/2010 9:57) Added new observation of INSTRUCTIONS: Please schedule your complete physical at your convenience- do not eat before this appt We'll review your records and figure out where we need to go Call with any questions or concerns Someone will call you w/ the cardiology appt Welcome!  We're glad to have you! (03/07/2010 9:57) Added new observation of HPI: 70 yo man here today to establish care.  previous MD- Dr Hilton Sinclair, [place], [place].  recently moved in w/ son, [name].  DM- taking Januvia and Lantus.  A1C last checked 'awhile ago'.  overdue for eye exam.  would like referral.  used to have symptomatic lows but in the last month pt reports CBGs in the 120s.  no CP, SOB, HAs, visual changes, edema, N/V, abd pain.  HTN- on Lisinopril, atenolol, torsemide.  asymptomatic.  adequate control.  Pacemaker- had his pacer interrogated in [place] yesterday.  needs to establish w/ local cards.  Hyperlipidemia- tolerating simvastatin w/out difficulty.  no myalgias.  needs refill.  Seasonal  allergies- using Flonase regularly, sxs well controlled.  Health Maintainence- Colonoscopy 2 yrs ago (Dr Hoover Browns), overdue for PSA (03/07/2010 9:57) Added new observation of MEDRECON: current updated (03/07/2010 9:57) Added new observation of ALLERGY REV: Done (03/07/2010 9:57) Added new observation of NKA: F (03/07/2010 9:57) Added new observation of SOCIAL HX: Retired  Married- wife had stroke 2011 Former Smoker--quit 10+ yrs ago Alcohol use-no Drug use-no  (03/07/2010 9:57) Added new observation of DRUG USE: never, no (03/07/2010 9:57) Added new observation of ALCOHOL COM: no (03/07/2010 9:57) Added new observation of SMOK STATUS: quit (03/07/2010 9:57) Added new observation of MARITAL STAT: Married (03/07/2010 9:57) Added new observation of WORK STATUS: Retired (03/07/2010 9:57) Added new observation of FAMILY HX: Family History Diabetes 1st degree relative--father Family History Hypertension--father  (03/07/2010 9:57) Added new observation of FH HTN: Family History Hypertension (03/07/2010 9:57) Added new observation of FH DIABETES: Family History Diabetes 1st degree relative (03/07/2010 9:57) Added new observation of PAST SURG HX: Pacemaker/Defib. Placement --2009 (03/07/2010 9:57) Added new observation of CHIEF CMPLNT: NP est care, needs cpx./kb (03/07/2010 9:57) Added new observation of TEMP(DEG C): 36.28 deg C (03/07/2010 9:57) Added new observation of WEIGHT (KG): 89.26 kg (03/07/2010 9:57) Added new observation of HEIGHT (CM): 172.72 cm (03/07/2010 9:57) Added new observation of BMI: 29.97  (03/07/2010 9:57) Added new observation of BP CUFF SIZE: regular  (03/07/2010 9:57) Added new observation of BP DIASTOLIC: 70 mmHg ([id number] 9:57) Added new observation of BP SYSTOLIC: 123XX123 mmHg ([id number] 9:57) Added new observation of TEMP SITE: oral  (03/07/2010 9:57) Added new observation of TEMPERATURE: 97.3 deg F (03/07/2010 9:57) Added new observation of WEIGHT: 196.38 lb  (03/07/2010 9:57) Added new observation of HEIGHT: 68 in (03/07/2010 9:57) Added new observation of COLONOSCOPY: polyps  (06/18/2007 10:25)

## 2010-07-17 NOTE — Letter (Signed)
Summary: Primary Care Consult Scheduled Letter  [facility] [place]   [place], [place] [address]   Phone: [phone]  Fax: [phone]      03/30/2010 MRN: [id number]  [facility] [address] [place], [place]  [address]    Dear Tyler Hahn,    We have scheduled an appointment for you.  At the recommendation of Dr. [name], we have scheduled you a consult with Dr. Bing Plume of [facility] on 05-07-2010 at 2:45pm.  Their address is [address] [place], [place] [address]. The office phone number is [phone].  If this appointment day and time is not convenient for you, please feel free to call the office of the doctor you are being referred to at the number listed above and reschedule the appointment.    It is important for you to keep your scheduled appointments. We are here to make sure you are given good patient care.   Thank you,    Renee, Patient Care Coordinator [place] at [facility]

## 2010-07-17 NOTE — Consult Note (Signed)
Summary: [facility]  [facility]   Imported By: Edmonia James 04/30/2010 13:56:23  _____________________________________________________________________  External Attachment:    Type:   Image     Comment:   External Document

## 2010-07-17 NOTE — Miscellaneous (Signed)
Summary:  Labs from [place] are from Dr. Tawny Asal. [facility].  [address] [place] [address]   [place]:  [phone]   FAZ:  Black CMA  March 15, 2010 1:20 PM  Observations: Added new observation of TRIGLYC TOT: 213 mg/dL (10/10/2009 9:46) Added new observation of LDL: 110 mg/dL (10/10/2009 9:46) Added new observation of HDL: 30 mg/dL (10/10/2009 9:46) Added new observation of CHOLESTEROL: 183 mg/dL (10/10/2009 9:46) Added new observation of CALCIUM: 9.0 mg/dL (10/10/2009 9:46) Added new observation of HGBA1C: 12.3 % (10/10/2009 9:46) Added new observation of GLUCOSE SER: 97 mg/dL (10/10/2009 9:46) Added new observation of CREATININE: 2.46 mg/dL (10/10/2009 9:46) Added new observation of BUN: 64 mg/dL (10/10/2009 9:46) Added new observation of CO2 TOTAL: 16 mmol/L (10/10/2009 9:46) Added new observation of CHLORIDE: 110 mmol/L (10/10/2009 9:46) Added new observation of POTASSIUM: 4.7 mmol/L (10/10/2009 9:46) Added new observation of SODIUM: 138 mmol/L (10/10/2009 9:46) Added new observation of BNP PG/ML: 21.6 pg/mL (08/17/2009 12:16) Added new observation of PSA: 0.29 ng/mL (08/17/2009 12:16) Added new observation of URIC ACID: 9.5 mg/dL (08/17/2009 12:16) Added new observation of HGBA1C: 18.8 % (08/17/2009 12:16) Added new observation of CHOL/HDL: 6.4  (08/17/2009 12:16) Added new observation of VLDL: 78 mg/dL (08/17/2009 12:16) Added new observation of LDL: 147 mg/dL (08/17/2009 12:16) Added new observation of HDL: 42 mg/dL (08/17/2009 12:16) Added new observation of TRIGLYC TOT: 390 mg/dL (08/17/2009 12:16) Added new observation of ALBUMIN: 4.1 g/dL (08/17/2009 12:16) Added new observation of PROTEIN, TOT: 8.4 g/dL (08/17/2009 12:16) Added new observation of CHOLESTEROL: 267 mg/dL (08/17/2009 12:16) Added new observation of ALK PHOS: 87 units/L (08/17/2009  12:16) Added new observation of BILI TOTAL: 0.4 mg/dL (08/17/2009 12:16) Added new observation of SGPT (ALT): 17 units/L (08/17/2009 12:16) Added new observation of SGOT (AST): 11 units/L (08/17/2009 12:16) Added new observation of CALCIUM: 9.1 mg/dL (08/17/2009 12:16) Added new observation of CREATININE: 1.92 mg/dL (08/17/2009 12:16) Added new observation of BUN: 38 mg/dL (08/17/2009 12:16) Added new observation of CO2 PLSM/SER: 23 meq/L (08/17/2009 12:16) Added new observation of CL SERUM: 100 meq/L (08/17/2009 12:16) Added new observation of K SERUM: 4.5 meq/L (08/17/2009 12:16) Added new observation of NA: 134 meq/L (08/17/2009 12:16) Added new observation of ABSOLUTE BAS: 0.0 K/uL (08/17/2009 12:16) Added new observation of EOS ABSLT: 0.2 K/uL (08/17/2009 12:16) Added new observation of ABSOLUTE MON: 0.3 K/uL (08/17/2009 12:16) Added new observation of ABS LYMPHOCY: 1.3 K/uL (08/17/2009 12:16) Added new observation of ABS NEUTROPH: 3.4 K/uL (08/17/2009 12:16) Added new observation of PLATELETK/UL: 160 K/uL (08/17/2009 12:16) Added new observation of RDW: 12.8 % (08/17/2009 12:16) Added new observation of MCV: 84.5 fL (08/17/2009 12:16) Added new observation of HCT: 41.3 % (08/17/2009 12:16) Added new observation of HGB: 12.9 g/dL (08/17/2009 12:16) Added new observation of RBC M/UL: 4.89 M/uL (08/17/2009 12:16) Added new observation of WBC COUNT: 5.2 10*3/microliter (08/17/2009 12:16) Added new observation of T3, TOTAL: 2.2 ng/dL (08/17/2009 12:16) Added new observation of TSH: 2.852 microintl units/mL (08/17/2009 12:16) Added new observation of T3 UPTAKE S: 33.4 % (08/17/2009 12:16) Added new observation of T4, TOTAL: 6.7 mcg/dL (08/17/2009 12:16)

## 2010-07-17 NOTE — Letter (Signed)
Summary: Primary Care Consult Scheduled Letter  [facility] [place]   [place], [place] [address]   Phone: [phone]  Fax: [phone]      03/30/2010 MRN: [id number]  [facility] [address] [place], [place]  [address]    Dear Mr. Lansky,    We have scheduled an appointment for you.  At the recommendation of Dr. [name], we have scheduled you a consult with Dr. Virl Axe of [facility] on 06-07-2010 at 9:45am.  Their address is [address] [place], [place] [address]. The office phone number is [phone].  If this appointment day and time is not convenient for you, please feel free to call the office of the doctor you are being referred to at the number listed above and reschedule the appointment.    It is important for you to keep your scheduled appointments. We are here to make sure you are given good patient care.   Thank you,    Renee, Patient Care Coordinator [place] at [facility]

## 2010-07-18 ENCOUNTER — Encounter: Payer: Self-pay | Admitting: Family Medicine

## 2010-07-19 ENCOUNTER — Encounter: Payer: Self-pay | Admitting: Family Medicine

## 2010-07-19 ENCOUNTER — Other Ambulatory Visit: Payer: Self-pay | Admitting: Family Medicine

## 2010-07-19 ENCOUNTER — Ambulatory Visit (INDEPENDENT_AMBULATORY_CARE_PROVIDER_SITE_OTHER): Payer: Medicare Other | Admitting: Family Medicine

## 2010-07-19 ENCOUNTER — Encounter (INDEPENDENT_AMBULATORY_CARE_PROVIDER_SITE_OTHER): Payer: Self-pay | Admitting: *Deleted

## 2010-07-19 DIAGNOSIS — E785 Hyperlipidemia, unspecified: Secondary | ICD-10-CM

## 2010-07-19 DIAGNOSIS — E119 Type 2 diabetes mellitus without complications: Secondary | ICD-10-CM

## 2010-07-19 DIAGNOSIS — I10 Essential (primary) hypertension: Secondary | ICD-10-CM

## 2010-07-19 LAB — HEMOGLOBIN A1C: Hgb A1c MFr Bld: 7.3 % — ABNORMAL HIGH (ref 4.6–6.5)

## 2010-07-19 NOTE — Consult Note (Signed)
Summary: [facility]   Imported By: Edmonia James 06/12/2010 08:05:46  _____________________________________________________________________  External Attachment:    Type:   Image     Comment:   External Document

## 2010-07-19 NOTE — Assessment & Plan Note (Signed)
Summary: nep/pt has pacemaker/to establish   Visit Type:  np  CC:  chest pain and .  History of Present Illness: [name] is seen to establish defibrillator care.  He presented to hospital about 2 years ago with symptoms of congestive heart failure manifested by dyspnea fatigue and peripheral edema. He underwent testing that sounds like was largely noninvasive including a Myoview. He denies a prior catheterization. He then underwent further implantation relatively proximate to his diagnosis. As best as I could discern there is not a prolonged wait. He has also had an intercurrent noninvasive evaluation of LV function; he does not have any idea what this demonstrates.  His symptoms now are largely class 1-2. He has minimal dyspnea. He does not have edema.  He has a chest pains; these are related to lifting his wife.  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 As Directed 2)  Torsemide 20 Mg Tabs (Torsemide) .Marland Kitchen.. 1 By Mouth Qd 3)  Viagra 100 Mg Tabs (Sildenafil Citrate) .Marland Kitchen.. 1 By Mouth 1 Hr Before Sex As Needed 4)  Extra Strength Acetaminophen 500 Mg Caps (Acetaminophen) .... Prn 5)  Lisinopril 10 Mg Tabs (Lisinopril) .Marland Kitchen.. 1 By Mouth Qd 6)  Colcrys 0.6 Mg Tabs (Colchicine) .... 2 By Mouth At Onset of Gout Attack 7)  Atenolol 50 Mg Tabs (Atenolol) .... 1.5 Tabs By Mouth Qd 8)  Januvia 50 Mg Tabs (Sitagliptin Phosphate) .Marland Kitchen.. 1 By Mouth Qd 9)  Simvastatin 40 Mg Tabs (Simvastatin) .Marland Kitchen.. 1 By Mouth Qhs 10)  Freestyle Lancets  Misc (Lancets) ....  Use As Directed Two Times A Day 11)  Lantus Solostar 100 Unit/ml Soln (Insulin Glargine) .... 23u Qam and 23u Qpm  Allergies (verified): 1)  ! Sulfa  Past History:  Past Medical History: Last updated: 04/04/2010 DM HTN Hyperlipidemia Gout Pacemaker ED Renal Insufficiency  Past Surgical History: Last updated: 03/07/2010 Pacemaker/Defib. Placement --2009  Family History: Last updated: 03/07/2010 Family History Diabetes 1st degree relative--father Family History Hypertension--father  Social History: Last updated: 03/07/2010 Retired Married- wife had stroke 2011 Former Smoker--quit 10+ yrs ago Alcohol use-no Drug use-no  Risk Factors: Alcohol Use: 0 (04/04/2010) Exercise: no (04/04/2010)  Risk Factors: Smoking Status: quit (04/04/2010)  Vital Signs:  Patient profile:   70 year old male Height:      68 inches Weight:      189.50 pounds BMI:     28.92 Pulse rate:   60 / minute BP sitting:   143 / 75  (left arm) Cuff size:   regular  Vitals Entered By: Hansel Feinstein CMA (June 07, 2010 10:13 AM)  Physical Exam  General:  Alert and oriented African American male appearing his stated age n no acute distress. HEENT  normal . Neck veins were flat; carotids brisk and full without bruits. No lymphadenopathy. Back without kyphosis. Lungs clear. Heart sounds regular without murmurs or gallops. PMI nondisplaced. Abdomen soft with active bowel sounds without midline pulsation or hepatomegaly. Femoral pulses and distal pulses intact. Extremities were without clubbing cyanosis or edema; there is a trigger finger on his hand  Skin warm and dry. Neurological exam grossly normal    EKG  Procedure date:  06/07/2010  Findings:      sinus at 60 N. 12.19/0.10/0.41 Flattened T waves  PPM Specifications Referring MD:  E   ICD Specifications Following MD:  Virl Axe, MD     ICD Vendor:  [facility] Scientific     ICD Model Number:  [id number]     ICD Serial Number:  [id number] ICD  DOI:  06/03/2008     ICD Implanting MD:  NOT IMPLANTED BY [place]  Lead 1:    Location: RA     DOI: 06/03/2008     Model #: [id number]     Status: active Lead 2:    Location: RV     DOI: 06/03/2008     Model #: [id number]     Serial #: [id number]     Status: active  ICD Follow Up Remote Check?  No Battery Voltage:  good V     Charge Time:  9.0 seconds     Battery Est. Longevity:  10.5 years ICD Dependent:  No       ICD Device Measurements Atrium:  Amplitude: 4.5 mV, Impedance: 544 ohms, Threshold: 0.9 V at 0.5 msec Right Ventricle:  Amplitude: 12.4 mV, Impedance: 622 ohms, Threshold: 0.5 V at 0.5 msec Shock Impedance: 53 ohms   Episodes Coumadin:  No Shock:  0     ATP:  0     Nonsustained:  0     Atrial Pacing:  2%     Ventricular Pacing:  1%  Brady Parameters Mode DDD     Lower Rate Limit:  60     Upper Rate Limit 90 PAV 400     Sensed AV Delay:  400  Tachy Zones VF:  220     VT:  170     Impression & Recommendations:  Problem # 1:  AUTOMATIC IMPLANTABLE CARDIAC DEFIBRILLATOR SITU (ICD-V45.02) Device parameters and data were reviewed and no changes were made  Problem # 2:  CARDIOMYOPATHY, 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 I presume from his disc correction that the Myoview was negative. Again no catheterization was apparently performed. We will try and track down these records. For right now and continue him on his ACE inhibitor and beta blockers is appropriate. He has seen a nephrologist because of his renal insufficiency. I will defer the management of his ACE inhibitor to them. His updated medication list for this problem includes:    Torsemide 20 Mg Tabs (Torsemide) .Marland Kitchen... 1 by mouth qd    Lisinopril 10 Mg Tabs (Lisinopril) .Marland Kitchen... 1 by mouth qd    Atenolol 50 Mg Tabs (Atenolol) .Marland Kitchen... 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 1 by mouth qd    Lisinopril 10 Mg Tabs (Lisinopril) .Marland Kitchen... 1 by mouth qd    Atenolol 50 Mg Tabs (Atenolol) .Marland Kitchen... 1.5 tabs by mouth qd  Patient Instructions: 1)  Your physician recommends that you continue on your current medications as directed. Please refer to the Current Medication list given to you today. 2)  Your physician recommends that you schedule a follow-up appointment in: 3 months with the [facility] 3)  Your physician wants you to follow-up in:6 months.  You will receive a reminder letter in the mail two  months in advance. If you don't receive a letter, please call our office to schedule the follow-up appointment.  Appended Document: nep/pt has pacemaker/to establish full review of systems was negative apart from a history of present illness and past medical history.\ gout

## 2010-07-19 NOTE — Miscellaneous (Signed)
Summary: Device preload  Clinical Lists Changes  Observations: Added new observation of ICDLEADSTAT2: active (06/07/2010 8:20) Added new observation of ICDLEADSER2: [id number]  (06/07/2010 8:20) Added new observation of ICDLEADMOD2: 0158  (06/07/2010 8:20) Added new observation of ICDLEADDOI2: 06/03/2008  (06/07/2010 8:20) Added new observation of ICDLEADLOC2: RV  (06/07/2010 8:20) Added new observation of ICDLEADSTAT1: active  (06/07/2010 8:20) Added new observation of ICDLEADMOD1: 1888TC  (06/07/2010 8:20) Added new observation of ICDLEADDOI1: 06/03/2008  (06/07/2010 8:20) Added new observation of ICDLEADLOC1: RA  (06/07/2010 8:20) Added new observation of ICD IMP MD: NOT IMPLANTED BY [place]  (06/07/2010 8:20) Added new observation of ICD IMPL DTE: 06/03/2008  (06/07/2010 8:20) Added new observation of ICD SERL#: [id number]  (06/07/2010 8:20) Added new observation of ICD MODL#: [id number]  (06/07/2010 8:20) Added new observation of ICDMANUFACTR: [employer]  (06/07/2010 8:20) Added new observation of ICD MD: Virl Axe, MD  (06/07/2010 8:20) Added new observation of PPM REFER MD: E  (06/07/2010 8:20)      PPM Specifications Referring MD:  E   ICD Specifications Following MD:  Virl Axe, MD     ICD Vendor:  [facility] Scientific     ICD Model Number:  [id number]     ICD Serial Number:  [id number] ICD DOI:  06/03/2008     ICD Implanting MD:  NOT IMPLANTED BY [place]  Lead 1:    Location: RA     DOI: 06/03/2008     Model #: [id number]     Status: active Lead 2:    Location: RV     DOI: 06/03/2008     Model #: [id number]     Serial #: [id number]     Status: active

## 2010-07-19 NOTE — Letter (Signed)
Summary: [place] No Show Letter  [facility]   [place], [place] [address]   Phone: [phone]  Fax: [phone]    07/05/2010 MRN: [id number]  [facility] [address] [place], [place]  [address]   Dear Mr. Favero,   Our records indicate that you missed your scheduled appointment with ________Dr.Tabori__________ on ____1/19/12_______.  Please contact this office to reschedule your appointment as soon as possible.  It is important that you keep your scheduled appointments with your physician, so we can provide you the best care possible.  Please be advised that there may be a charge for "no show" appointments.    Sincerely,   [place] at [facility]

## 2010-07-19 NOTE — Progress Notes (Signed)
  ROI faxed over to Dr.Wayne Kreeber @ [facility] to fax [phone] [facility]  June 08, 2010 9:00 AM     Appended Document:  Records received from Dr.Wayne Thurman [place] ( [facility] [employer]) gave to [place]

## 2010-07-19 NOTE — Letter (Signed)
Summary: [facility]  [facility]   Imported By: Edmonia James 06/12/2010 08:01:23  _____________________________________________________________________  External Attachment:    Type:   Image     Comment:   External Document

## 2010-07-25 NOTE — Assessment & Plan Note (Signed)
Summary: 3 month roa   Vital Signs:  Patient profile:   70 year old male Height:      68 inches Weight:      180 pounds BMI:     27.47 Pulse rate:   74 / minute BP sitting:   112 / 70  (left arm)  Vitals Entered By: Malachi Bonds CMA (July 19, 2010 9:23 AM) CC: 3 month roa    History of Present Illness: 70 yo man here today for f/u on  1) DM- CBGs running 90s-100s.  has only used Lantus once in 2 weeks.  taking Januvia daily.  no symptomatic lows.  no CP, SOB, HAs, visual changes, edema.  has lost 9 lbs since last visit.  2) Hyperlipidemia- still taking Simvastatin.  will switch to Lipitor now that it's generic.  no abd pain, N/V, myalgias  3) HTN- Lisinopril now 20mg  daily (Dr Hassell Done).  asymptomatic.  BP excellent.  Current Medications (verified): 1)  Flonase 50 Mcg/act Susp (Fluticasone Propionate) .... As Directed 2)  Torsemide 20 Mg Tabs (Torsemide) .Marland Kitchen.. 1 By Mouth Qd 3)  Viagra 100 Mg Tabs (Sildenafil Citrate) .Marland Kitchen.. 1 By Mouth 1 Hr Before Sex As Needed 4)  Extra Strength Acetaminophen 500 Mg Caps (Acetaminophen) .... Prn 5)  Lisinopril 20 Mg Tabs (Lisinopril) .... Take One Tablet Daily 6)  Colcrys 0.6 Mg Tabs (Colchicine) .... 2 By Mouth At Onset of Gout Attack 7)  Atenolol 50 Mg Tabs (Atenolol) .... 1.5 Tabs By Mouth Qd 8)  Januvia 50 Mg Tabs (Sitagliptin Phosphate) .Marland Kitchen.. 1 By Mouth Qd 9)  Lipitor 40 Mg Tabs (Atorvastatin Calcium) .... Take 1 Tab By Mouth Daily 10)  Freestyle Lancets  Misc (Lancets) .... Use As Directed Two Times A Day 11)  Lantus Solostar 100 Unit/ml Soln (Insulin Glargine) .... 23u Qam and 23u Qpm  Allergies (verified): 1)  ! Sulfa  Past History:  Past medical, surgical, family and social histories (including risk factors) reviewed, and no changes noted (except as noted below).  Past Medical History: Reviewed history from 04/04/2010 and no changes required. DM HTN Hyperlipidemia Gout Pacemaker ED Renal Insufficiency  Past Surgical  History: Reviewed history from 03/07/2010 and no changes required. Pacemaker/Defib. Placement --2009  Family History: Reviewed history from 03/07/2010 and no changes required. Family History Diabetes 1st degree relative--father Family History Hypertension--father  Social History: Reviewed history from 03/07/2010 and no changes required. Retired Married- wife had stroke 2011 Former Smoker--quit 10+ yrs ago Alcohol use-no Drug use-no  Review of Systems      See HPI  Physical Exam  General:  Well-developed,well-nourished,in no acute distress; alert,appropriate and cooperative throughout examination Neck:  No deformities, masses, or tenderness noted. Lungs:  Normal respiratory effort, chest expands symmetrically. Lungs are clear to auscultation, no crackles or wheezes. Heart:  reg S1/S2 Abdomen:  soft, NT/ND, +BS Pulses:  +2 radial, DP/PT Extremities:  No clubbing, cyanosis, edema, or deformity noted Neurologic:  alert & oriented X3, cranial nerves II-XII intact, and gait normal.   Skin:  turgor normal and color normal.   Cervical Nodes:  No lymphadenopathy noted Psych:  Oriented X3, memory intact for recent and remote, normally interactive, good eye contact, not anxious appearing, and not depressed appearing.     Impression & Recommendations:  Problem # 1:  DIABETES-TYPE 2 (ICD-250.00) Assessment Unchanged pt's reported CBGs are excellent but have some concerns since he is not taking his Lantus.  will get A1C to assess level of control and adjust meds  as needed. His updated medication list for this problem includes:    Lisinopril 20 Mg Tabs (Lisinopril) .Marland Kitchen... Take one tablet daily    Januvia 50 Mg Tabs (Sitagliptin phosphate) .Marland Kitchen... 1 by mouth qd    Lantus Solostar 100 Unit/ml Soln (Insulin glargine) .Marland Kitchen... 23u qam and 23u qpm  Orders: Venipuncture [id number]) TLB-A1C / Hgb A1C (Glycohemoglobin) (83036-A1C) TLB-A1C / Hgb A1C (Glycohemoglobin) (83036-A1C)  Problem # 2:   HYPERTENSION, BENIGN ESSENTIAL (ICD-401.1) Assessment: Unchanged BP is excellent today.  asymptomatic.  renal increased ACE to 20mg  daily.  no changes at this time. His updated medication list for this problem includes:    Torsemide 20 Mg Tabs (Torsemide) .Marland Kitchen... 1 by mouth qd    Lisinopril 20 Mg Tabs (Lisinopril) .Marland Kitchen... Take one tablet daily    Atenolol 50 Mg Tabs (Atenolol) .Marland Kitchen... 1.5 tabs by mouth qd  Problem # 3:  HYPERLIPIDEMIA (ICD-272.4) Assessment: Unchanged given that pt is not at goal on Simvastatin (LDL<70 due to DM) will switch to Lipitor now that it is generic.  will recheck lipids at next visit. His updated medication list for this problem includes:    Lipitor 40 Mg Tabs (Atorvastatin calcium) .Marland Kitchen... Take 1 tab by mouth daily  Complete Medication List: 1)  Flonase 50 Mcg/act Susp (Fluticasone propionate) .... As directed 2)  Torsemide 20 Mg Tabs (Torsemide) .Marland Kitchen.. 1 by mouth qd 3)  Viagra 100 Mg Tabs (Sildenafil citrate) .Marland Kitchen.. 1 by mouth 1 hr before sex as needed 4)  Extra Strength Acetaminophen 500 Mg Caps (Acetaminophen) .... Prn 5)  Lisinopril 20 Mg Tabs (Lisinopril) .... Take one tablet daily 6)  Colcrys 0.6 Mg Tabs (Colchicine) .... 2 by mouth at onset of gout attack 7)  Atenolol 50 Mg Tabs (Atenolol) .... 1.5 tabs by mouth qd 8)  Januvia 50 Mg Tabs (Sitagliptin phosphate) .Marland Kitchen.. 1 by mouth qd 9)  Lipitor 40 Mg Tabs (Atorvastatin calcium) .... Take 1 tab by mouth daily 10)  Freestyle Lancets Misc (Lancets) .... Use as directed two times a day 11)  Lantus Solostar 100 Unit/ml Soln (Insulin glargine) .... 23u qam and 23u qpm  Other Orders: Specimen Handling (99000)  Patient Instructions: 1)  Please schedule a follow-up appointment in 3 months to recheck diabetes and cholesterol- don't eat before this appt. 2)  When you finish your Simvastatin, switch to the Lipitor 3)  We'll call you with your lab results and make any changes if needed 4)  You look fantastic!  Keep up the  good work! 5)  Happy Valentine's Day!!  Prescriptions: LIPITOR 40 MG TABS (ATORVASTATIN CALCIUM) Take 1 tab by mouth daily  #30 x 3   Entered and Authorized by:   [name] MD   Signed by:   [name] MD on 07/19/2010   Method used:   Electronically to        [facility] (retail)       [address]       [place], [place]  [address]       Ph: [phone]       Fax: [phone]   RxID:   [id number]    Orders Added: 1)  Venipuncture [id number] 2)  TLB-A1C / Hgb A1C (Glycohemoglobin) [83036-A1C] 3)  Specimen Handling [99000] 4)  TLB-A1C / Hgb A1C (Glycohemoglobin) [83036-A1C] 5)  Est. Patient Level IV [id number]

## 2010-07-25 NOTE — Miscellaneous (Signed)
  Clinical Lists Changes  Observations: Added new observation of PTH, INTACT: 87 pg/mL (07/06/2010 14:08) Added new observation of CALCIUM: 9.5 mg/dL (07/06/2010 14:08) Added new observation of GLUCOSE SER: 81 mg/dL (07/06/2010 14:08) Added new observation of CREATININE: 2.12 mg/dL (07/06/2010 14:08) Added new observation of BUN: 49 mg/dL (07/06/2010 14:08) Added new observation of CO2 TOTAL: 19 mmol/L (07/06/2010 14:08) Added new observation of CHLORIDE: 103 mmol/L (07/06/2010 14:08) Added new observation of POTASSIUM: 5.1 mmol/L (07/06/2010 14:08) Added new observation of SODIUM: 134 mmol/L (07/06/2010 14:08)

## 2010-07-31 ENCOUNTER — Telehealth (INDEPENDENT_AMBULATORY_CARE_PROVIDER_SITE_OTHER): Payer: Self-pay | Admitting: *Deleted

## 2010-08-06 ENCOUNTER — Telehealth (INDEPENDENT_AMBULATORY_CARE_PROVIDER_SITE_OTHER): Payer: Self-pay | Admitting: *Deleted

## 2010-08-08 NOTE — Progress Notes (Signed)
Summary: refill  Phone Note Refill Request Call back at Home Phone [phone] Message from:  son  Refills Requested: Medication #1:  TORSEMIDE 20 MG TABS 1 by mouth qd patient wants refill sent to [facility]  Initial call taken by: Arbie [place],  July 31, 2010 10:06 AM    Prescriptions: TORSEMIDE 20 MG TABS (TORSEMIDE) 1 by mouth qd  #30 x 2   Entered by:   Rolla Flatten CMA   Authorized by:   [name] MD   Signed by:   Rolla Flatten CMA on 07/31/2010   Method used:   Faxed to ...       [facility] (retail)       [address]       [place], [place]  [address]       Ph: [phone]       Fax: [phone]   RxID:   [id number]

## 2010-08-08 NOTE — Letter (Signed)
Summary: [facility]   Imported By: Laural Benes 07/31/2010 11:19:20  _____________________________________________________________________  External Attachment:    Type:   Image     Comment:   External Document

## 2010-08-14 NOTE — Progress Notes (Signed)
Summary: Refill--Januvia  Phone Note Refill Request Message from:  Fax from Pharmacy on August 06, 2010 12:08 PM  Refills Requested: Medication #1:  JANUVIA 50 MG TABS 1 by mouth qd   Notes: [facility] Next Appointment Scheduled: none Initial call taken by: Ernestene Mention CMA,  August 06, 2010 12:08 PM    Prescriptions: JANUVIA 50 MG TABS (SITAGLIPTIN PHOSPHATE) 1 by mouth qd  #30 x 6   Entered by:   Malachi Bonds CMA   Authorized by:   [name] MD   Signed by:   Malachi Bonds CMA on 08/06/2010   Method used:   Electronically to        [facility] (retail)       [address]       [place], [place]  [address]       Ph: [phone]       Fax: [phone]   RxID:   [id number]

## 2010-08-29 ENCOUNTER — Encounter (INDEPENDENT_AMBULATORY_CARE_PROVIDER_SITE_OTHER): Payer: Medicare Other

## 2010-08-29 ENCOUNTER — Encounter: Payer: Self-pay | Admitting: Internal Medicine

## 2010-08-29 DIAGNOSIS — I428 Other cardiomyopathies: Secondary | ICD-10-CM

## 2010-09-04 NOTE — Procedures (Signed)
Summary: device/saf  mca   Current Medications (verified): 1)  Flonase 50 Mcg/act Susp (Fluticasone Propionate) .... As Directed 2)  Torsemide 20 Mg Tabs (Torsemide) .Marland Kitchen.. 1 By Mouth Qd 3)  Viagra 100 Mg Tabs (Sildenafil Citrate) .Marland Kitchen.. 1 By Mouth 1 Hr Before Sex As Needed 4)  Extra Strength Acetaminophen 500 Mg Caps (Acetaminophen) .... Prn 5)  Lisinopril 20 Mg Tabs (Lisinopril) .... Take One Tablet Daily 6)  Colcrys 0.6 Mg Tabs (Colchicine) .... 2 By Mouth At Onset of Gout Attack 7)  Atenolol 50 Mg Tabs (Atenolol) .... 1.5 Tabs By Mouth Qd 8)  Januvia 50 Mg Tabs (Sitagliptin Phosphate) .Marland Kitchen.. 1 By Mouth Qd 9)  Lipitor 40 Mg Tabs (Atorvastatin Calcium) .... Take 1 Tab By Mouth Daily 10)  Freestyle Lancets  Misc (Lancets) .... Use As Directed Two Times A Day 11)  Lantus Solostar 100 Unit/ml Soln (Insulin Glargine) .... 23u Qam and 23u Qpm  Allergies (verified): 1)  ! Sulfa  PPM Specifications Referring MD:  E   ICD Specifications Following MD:  Virl Axe, MD     ICD Vendor:  [employer] Number:  [id number]     ICD Serial Number:  [id number] ICD DOI:  06/03/2008     ICD Implanting MD:  NOT IMPLANTED BY [place]  Lead 1:    Location: RA     DOI: 06/03/2008     Model #: [id number]     Status: active Lead 2:    Location: RV     DOI: 06/03/2008     Model #: [id number]     Serial #: [id number]     Status: active  ICD Follow Up ICD Dependent:  No      Episodes Coumadin:  No  Brady Parameters Mode DDD     Lower Rate Limit:  60     Upper Rate Limit 90 PAV 400     Sensed AV Delay:  400  Tachy Zones VF:  220     VT:  170     Tech Comments:  See PaceArt

## 2010-10-30 ENCOUNTER — Encounter: Payer: Self-pay | Admitting: Family Medicine

## 2010-12-03 ENCOUNTER — Ambulatory Visit (INDEPENDENT_AMBULATORY_CARE_PROVIDER_SITE_OTHER): Payer: Medicare Other | Admitting: *Deleted

## 2010-12-03 DIAGNOSIS — I429 Cardiomyopathy, unspecified: Secondary | ICD-10-CM

## 2010-12-03 NOTE — Progress Notes (Signed)
icd check in clinic  

## 2011-01-28 ENCOUNTER — Encounter: Payer: Self-pay | Admitting: Internal Medicine

## 2011-03-06 ENCOUNTER — Other Ambulatory Visit: Payer: Self-pay | Admitting: Internal Medicine

## 2011-03-06 ENCOUNTER — Encounter: Payer: Self-pay | Admitting: Internal Medicine

## 2011-03-06 ENCOUNTER — Ambulatory Visit (INDEPENDENT_AMBULATORY_CARE_PROVIDER_SITE_OTHER): Payer: Medicare Other | Admitting: *Deleted

## 2011-03-06 DIAGNOSIS — I5022 Chronic systolic (congestive) heart failure: Secondary | ICD-10-CM

## 2011-03-06 DIAGNOSIS — I429 Cardiomyopathy, unspecified: Secondary | ICD-10-CM

## 2011-03-06 LAB — ICD DEVICE OBSERVATION
AL AMPLITUDE: 3.5 mv
BAMS-0001: 170 {beats}/min
DEV-0020ICD: NEGATIVE
DEVICE MODEL ICD: 131093
HV IMPEDENCE: 50 Ohm
RV LEAD AMPLITUDE: 11.1 mv
RV LEAD THRESHOLD: 0.6 V
TZAT-0001FASTVT: 1
TZAT-0001FASTVT: 2
TZAT-0002FASTVT: NEGATIVE
TZAT-0005FASTVT: 81 pct
TZAT-0019FASTVT: 7.5 V
TZON-0003FASTVT: 353 ms
TZON-0005FASTVT: 1
TZST-0001FASTVT: 4
TZST-0001FASTVT: 7
TZST-0003FASTVT: 31 J
TZST-0003FASTVT: 41 J
TZST-0003FASTVT: 41 J
TZST-0003FASTVT: 41 J

## 2011-03-06 NOTE — Progress Notes (Signed)
ICD check

## 2011-03-19 ENCOUNTER — Other Ambulatory Visit: Payer: Self-pay | Admitting: Family Medicine

## 2011-03-19 DIAGNOSIS — I429 Cardiomyopathy, unspecified: Secondary | ICD-10-CM

## 2011-03-19 MED ORDER — TORSEMIDE 20 MG PO TABS: 20.0000 mg | ORAL_TABLET | Freq: Every day | ORAL | Status: DC

## 2011-03-19 NOTE — Telephone Encounter (Signed)
DONE

## 2011-04-25 ENCOUNTER — Other Ambulatory Visit: Payer: Self-pay | Admitting: Family Medicine

## 2011-04-25 DIAGNOSIS — I429 Cardiomyopathy, unspecified: Secondary | ICD-10-CM

## 2011-04-25 MED ORDER — ATORVASTATIN CALCIUM 40 MG PO TABS: 40.0000 mg | ORAL_TABLET | Freq: Every day | ORAL | Status: DC

## 2011-04-25 NOTE — Telephone Encounter (Signed)
rx sent to pharmacy by e-script For lipitor

## 2011-06-19 ENCOUNTER — Other Ambulatory Visit: Payer: Self-pay | Admitting: Family Medicine

## 2011-06-19 DIAGNOSIS — I429 Cardiomyopathy, unspecified: Secondary | ICD-10-CM

## 2011-06-19 MED ORDER — COLCHICINE 0.6 MG PO TABS: 0.6000 mg | ORAL_TABLET | ORAL | Status: DC | PRN

## 2011-06-19 NOTE — Telephone Encounter (Signed)
rx sent to pharmacy by e-script For #30

## 2011-07-01 ENCOUNTER — Telehealth: Payer: Self-pay | Admitting: *Deleted

## 2011-07-01 NOTE — Telephone Encounter (Signed)
Letter has been mailed to pt address noted in the chart to advise they are overdue for cpe/ov/labs and the pt needs to contact office to set up appt

## 2011-08-12 ENCOUNTER — Other Ambulatory Visit: Payer: Self-pay

## 2011-08-12 DIAGNOSIS — I429 Cardiomyopathy, unspecified: Secondary | ICD-10-CM

## 2011-08-12 MED ORDER — COLCHICINE 0.6 MG PO TABS: 0.6000 mg | ORAL_TABLET | ORAL | Status: DC | PRN

## 2011-08-12 NOTE — Telephone Encounter (Signed)
[facility] for #30 but no refills- pt is overdue for appt

## 2011-08-12 NOTE — Telephone Encounter (Signed)
Last seen 07/19/10. Please advise    KP

## 2011-08-15 ENCOUNTER — Encounter: Payer: Self-pay | Admitting: Family Medicine

## 2011-08-15 ENCOUNTER — Ambulatory Visit (INDEPENDENT_AMBULATORY_CARE_PROVIDER_SITE_OTHER): Payer: Medicare Other | Admitting: Family Medicine

## 2011-08-15 DIAGNOSIS — Z23 Encounter for immunization: Secondary | ICD-10-CM

## 2011-08-15 DIAGNOSIS — Z0000 Encounter for general adult medical examination without abnormal findings: Secondary | ICD-10-CM

## 2011-08-15 DIAGNOSIS — Z125 Encounter for screening for malignant neoplasm of prostate: Secondary | ICD-10-CM

## 2011-08-15 DIAGNOSIS — I429 Cardiomyopathy, unspecified: Secondary | ICD-10-CM

## 2011-08-15 DIAGNOSIS — I10 Essential (primary) hypertension: Secondary | ICD-10-CM

## 2011-08-15 DIAGNOSIS — M109 Gout, unspecified: Secondary | ICD-10-CM

## 2011-08-15 DIAGNOSIS — E119 Type 2 diabetes mellitus without complications: Secondary | ICD-10-CM

## 2011-08-15 DIAGNOSIS — E785 Hyperlipidemia, unspecified: Secondary | ICD-10-CM

## 2011-08-15 LAB — TSH: TSH: 3.84 u[IU]/mL (ref 0.35–5.50)

## 2011-08-15 LAB — CBC WITH DIFFERENTIAL/PLATELET
Basophils Absolute: 0 10*3/uL (ref 0.0–0.1)
Basophils Relative: 0.6 % (ref 0.0–3.0)
Eosinophils Absolute: 0.3 10*3/uL (ref 0.0–0.7)
Lymphocytes Relative: 24.8 % (ref 12.0–46.0)
MCHC: 32.9 g/dL (ref 30.0–36.0)
MCV: 83 fl (ref 78.0–100.0)
Monocytes Absolute: 0.6 10*3/uL (ref 0.1–1.0)
Neutrophils Relative %: 60.1 % (ref 43.0–77.0)
Platelets: 170 10*3/uL (ref 150.0–400.0)
RBC: 3.98 Mil/uL — ABNORMAL LOW (ref 4.22–5.81)
RDW: 14.7 % — ABNORMAL HIGH (ref 11.5–14.6)

## 2011-08-15 LAB — LIPID PANEL
LDL Cholesterol: 131 mg/dL — ABNORMAL HIGH (ref 0–99)
Total CHOL/HDL Ratio: 4

## 2011-08-15 LAB — BASIC METABOLIC PANEL
BUN: 56 mg/dL — ABNORMAL HIGH (ref 6–23)
Chloride: 110 mEq/L (ref 96–112)
GFR: 24.38 mL/min — ABNORMAL LOW (ref >60.00)
Potassium: 4.7 mEq/L (ref 3.5–5.1)
Sodium: 139 mEq/L (ref 135–145)

## 2011-08-15 LAB — HEPATIC FUNCTION PANEL
ALT: 22 U/L (ref 0–53)
AST: 24 U/L (ref 0–37)
Bilirubin, Direct: 0.1 mg/dL (ref 0.0–0.3)
Total Bilirubin: 0.3 mg/dL (ref 0.3–1.2)

## 2011-08-15 LAB — PSA: PSA: 0.46 ng/mL (ref 0.10–4.00)

## 2011-08-15 LAB — HEMOGLOBIN A1C: Hgb A1c MFr Bld: 7.1 % — ABNORMAL HIGH (ref 4.6–6.5)

## 2011-08-15 MED ORDER — LISINOPRIL 20 MG PO TABS: 20.0000 mg | ORAL_TABLET | Freq: Every day | ORAL | Status: DC

## 2011-08-15 MED ORDER — ALLOPURINOL 100 MG PO TABS: 100.0000 mg | ORAL_TABLET | Freq: Every day | ORAL | Status: DC

## 2011-08-15 MED ORDER — TORSEMIDE 20 MG PO TABS: 20.0000 mg | ORAL_TABLET | Freq: Every day | ORAL | Status: DC

## 2011-08-15 MED ORDER — SITAGLIPTIN PHOSPHATE 50 MG PO TABS: 50.0000 mg | ORAL_TABLET | Freq: Every day | ORAL | Status: DC

## 2011-08-15 NOTE — Progress Notes (Signed)
  Subjective:    Patient ID: Tyler Hahn, male    DOB: June 10, 1941, 71 y.o.   MRN: [id number]  HPI Here today for CPE.  Risk Factors: HTN- chronic problem, on atenolol and lisinopril.  No CP, SOB, HAs, visual changes, edema. Hyperlipidemia- chronic problem.  On lipitor.  No N/V, abd pain, myalgias. DM- chronic problem, UTD on eye exam.  Has stopped Lantus due to good CBGs.  Now only on Januvia.  CBGs reportedly 110s.  Denies symptomatic lows, neuropathy. Gout- chronic problem, has deteriorated in the last month.  Had flare in L wrist and R ankle.  Took colchicine both times as tx.  Previously on Allopurinol but 'it's been a long time'.   Physical Activity: exercising regularly Fall Risk: very low risk, steady on feet Depression: asymptomatic Hearing: normal to conversational tones and whispered voice. ADL's: independent Cognitive: normal linear thought process, memory and attention intact. Home Safety: safe at home, lives w/ wife Height, Weight, BMI, Visual Acuity: see vitals, vision corrected to 20/20 w/ glasses Counseling: has never had colonoscopy- 'not interested' Labs Ordered: See A&P Care Plan: See A&P    Review of Systems Patient reports no vision/hearing changes, anorexia, fever ,adenopathy, persistant/recurrent hoarseness, swallowing issues, chest pain, palpitations, edema, persistant/recurrent cough, hemoptysis, dyspnea (rest,exertional, paroxysmal nocturnal), gastrointestinal  bleeding (melena, rectal bleeding), abdominal pain, excessive heart burn, GU symptoms (dysuria, hematuria, voiding/incontinence issues) syncope, focal weakness, memory loss, numbness & tingling, skin/hair/nail changes, depression, anxiety, abnormal bruising/bleeding.  + joint swelling w/ gout    Objective:   Physical Exam BP 135/75  Pulse 80  Temp(Src) 98.3 F (36.8 C) (Oral)  Ht 5' 6.5" (1.689 m)  Wt 183 lb 3.2 oz (83.099 kg)  BMI 29.13 kg/m2  SpO2 97%  General Appearance:    Alert,  cooperative, no distress, appears stated age  Head:    Normocephalic, without obvious abnormality, atraumatic  Eyes:    PERRL, conjunctiva/corneas clear, EOM's intact, fundi    benign, both eyes       Ears:    Normal TM's and external ear canals, both ears  Nose:   Nares normal, septum midline, mucosa normal, no drainage   or sinus tenderness  Throat:   Lips, mucosa, and tongue normal; teeth and gums normal  Neck:   Supple, symmetrical, trachea midline, no adenopathy;       thyroid:  No enlargement/tenderness/nodules  Back:     Symmetric, no curvature, ROM normal, no CVA tenderness  Lungs:     Clear to auscultation bilaterally, respirations unlabored  Chest wall:    No tenderness or deformity  Heart:    Regular rate and rhythm, S1 and S2 normal, no murmur, rub   or gallop  Abdomen:     Soft, non-tender, bowel sounds active all four quadrants,    no masses, no organomegaly  Genitalia:    Normal male without lesion, discharge or tenderness  Rectal:    Normal tone, normal prostate, no masses or tenderness  Extremities:   Extremities normal, atraumatic, no cyanosis or edema  Pulses:   2+ and symmetric all extremities  Skin:   Skin color, texture, turgor normal, no rashes or lesions  Lymph nodes:   Cervical, supraclavicular, and axillary nodes normal  Neurologic:   CNII-XII intact. Normal strength, sensation and reflexes      throughout          Assessment & Plan:

## 2011-08-15 NOTE — Patient Instructions (Signed)
Follow up in 3 months to recheck diabetes and blood pressure We'll notify you of your lab results and make any changes if needed Mail back your stool card Start the Allopurinol daily- take the Colchicine when you start this to avoid a gout attack You look great!  Keep up the good work! Call with any questions or concerns Hang in there!!

## 2011-08-16 ENCOUNTER — Encounter: Payer: Self-pay | Admitting: *Deleted

## 2011-08-16 MED ORDER — SITAGLIPTIN PHOSPHATE 50 MG PO TABS: 50.0000 mg | ORAL_TABLET | Freq: Every day | ORAL | Status: DC

## 2011-08-16 MED ORDER — ATORVASTATIN CALCIUM 40 MG PO TABS: 40.0000 mg | ORAL_TABLET | Freq: Every day | ORAL | Status: DC

## 2011-08-18 NOTE — Assessment & Plan Note (Signed)
Chronic problem.  Pt has not followed up as directed.  Has stopped Lantus b/c CBGs well controlled on oral meds.  Asymptomatic.  UTD on eye exam.  Check labs.  Adjust meds prn

## 2011-08-18 NOTE — Assessment & Plan Note (Signed)
Deteriorated.  Pt w/ 2 flares recently.  Interested in resuming allopurinol as preventative med.  Script given along w/ instructions for use.  Pt expressed understanding and is in agreement w/ plan.

## 2011-08-18 NOTE — Assessment & Plan Note (Signed)
Chronic problem.  Adequate control.  Asymptomatic.  No changes at this time 

## 2011-08-18 NOTE — Assessment & Plan Note (Signed)
Chronic problem.  Due for labs.  Tolerating statin w/out difficulty per report.  Check labs.  Adjust meds prn

## 2011-08-18 NOTE — Assessment & Plan Note (Signed)
Pt's PE WNL.  Pneumovax given.  Refusing colonoscopy.  Check labs.  Anticipatory guidance provided.

## 2011-08-26 ENCOUNTER — Encounter: Payer: Self-pay | Admitting: *Deleted

## 2011-08-26 LAB — FECAL OCCULT BLOOD, IMMUNOCHEMICAL: Fecal Occult Bld: NEGATIVE

## 2011-09-03 ENCOUNTER — Encounter: Payer: Self-pay | Admitting: Internal Medicine

## 2011-09-03 ENCOUNTER — Other Ambulatory Visit: Payer: Self-pay | Admitting: *Deleted

## 2011-09-03 ENCOUNTER — Ambulatory Visit (INDEPENDENT_AMBULATORY_CARE_PROVIDER_SITE_OTHER): Payer: Medicare Other | Admitting: Internal Medicine

## 2011-09-03 VITALS — BP 130/74 | HR 87 | Ht 67.75 in | Wt 186.8 lb

## 2011-09-03 DIAGNOSIS — I5022 Chronic systolic (congestive) heart failure: Secondary | ICD-10-CM

## 2011-09-03 DIAGNOSIS — E875 Hyperkalemia: Secondary | ICD-10-CM

## 2011-09-03 DIAGNOSIS — N259 Disorder resulting from impaired renal tubular function, unspecified: Secondary | ICD-10-CM

## 2011-09-03 LAB — ICD DEVICE OBSERVATION
AL AMPLITUDE: 4.7 mv
AL IMPEDENCE ICD: 693 Ohm
ATRIAL PACING ICD: 2 pct
BAMS-0003: 70 {beats}/min
HV IMPEDENCE: 53 Ohm
RV LEAD IMPEDENCE ICD: 404 Ohm
TZAT-0001FASTVT: 1
TZAT-0001FASTVT: 2
TZAT-0002FASTVT: NEGATIVE
TZAT-0004FASTVT: 10
TZAT-0005FASTVT: 81 pct
TZAT-0013FASTVT: 2
TZAT-0018FASTVT: NEGATIVE
TZAT-0019FASTVT: 7.5 V
TZON-0003FASTVT: 353 ms
TZON-0005FASTVT: 1
TZST-0001FASTVT: 3
TZST-0001FASTVT: 4
TZST-0001FASTVT: 6
TZST-0001FASTVT: 8
TZST-0003FASTVT: 41 J
TZST-0003FASTVT: 41 J
TZST-0003FASTVT: 41 J

## 2011-09-03 LAB — BASIC METABOLIC PANEL
BUN: 77 mg/dL — ABNORMAL HIGH (ref 6–23)
CO2: 22 mEq/L (ref 19–32)
Calcium: 9.4 mg/dL (ref 8.4–10.5)
Creatinine, Ser: 2.7 mg/dL — ABNORMAL HIGH (ref 0.4–1.5)
GFR: 24.9 mL/min — ABNORMAL LOW (ref >60.00)
Glucose, Bld: 104 mg/dL — ABNORMAL HIGH (ref 70–99)
Sodium: 140 mEq/L (ref 135–145)

## 2011-09-03 MED ORDER — CARVEDILOL 3.125 MG PO TABS: 3.1250 mg | ORAL_TABLET | Freq: Two times a day (BID) | ORAL | Status: DC

## 2011-09-03 NOTE — Assessment & Plan Note (Signed)
As above We'll check creatinine to

## 2011-09-03 NOTE — Progress Notes (Signed)
  HPI  Tyler Hahn is a 71 y.o. 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 Therapy has been complicated by renal insufficiency  Most recent cr 2.8. He has not seen nephrology recently  He denies significant shortness of breath or peripheral edema; he has no orthopnea or nocturnal dyspnea. He is caring for his wife.  Past Medical History  Diagnosis Date  . Hypertension   . Diabetes mellitus   . Hyperlipidemia     No past surgical history on file.  Current Outpatient Prescriptions  Medication Sig Dispense Refill  . allopurinol (ZYLOPRIM) 100 MG tablet Take 1 tablet (100 mg total) by mouth daily.  30 tablet  3  . atorvastatin (LIPITOR) 40 MG tablet Take 1 tablet (40 mg total) by mouth daily.  30 tablet  5  . colchicine 0.6 MG tablet Take 1 tablet (0.6 mg total) by mouth as needed.  30 tablet  0  . fluticasone (FLONASE) 50 MCG/ACT nasal spray Place 2 sprays into the nose as directed.        Marland Kitchen lisinopril (PRINIVIL,ZESTRIL) 20 MG tablet Take 1 tablet (20 mg total) by mouth daily.  30 tablet  5  . Omega-3 Fatty Acids (FISH OIL) 1200 MG CAPS Take by mouth daily.        . sildenafil (VIAGRA) 100 MG tablet Take 100 mg by mouth as needed.        . sitaGLIPtin (JANUVIA) 50 MG tablet Take 1 tablet (50 mg total) by mouth daily.  30 tablet  5  . torsemide (DEMADEX) 20 MG tablet Take 1 tablet (20 mg total) by mouth daily.  30 tablet  5    Allergies  Allergen Reactions  . Sulfonamide Derivatives     REACTION: itching    Review of Systems negative except from HPI and PMH  Physical Exam BP 130/74  Pulse 87  Ht 5' 7.75" (1.721 m)  Wt 186 lb 12.8 oz (84.732 kg)  BMI 28.61 kg/m2 Well developed and well nourished in no acute distress HENT normal E scleral and icterus clear Neck Supple JVP flat; carotids brisk and full Clear to ausculation Regular rate and rhythm, no murmurs gallops or rub Soft with active bowel sounds No  clubbing cyanosis none Edema Alert and oriented, grossly normal motor and sensory function Skin Warm and Dry   Assessment and  Plan

## 2011-09-03 NOTE — Assessment & Plan Note (Signed)
He is on ACE inhibitor therapy. His creatinine is 2.8. A month ago. We'll recheck it today. I've encouraged him to followup with a nephrologist.

## 2011-09-03 NOTE — Assessment & Plan Note (Signed)
As above we will begin him on carvedilol

## 2011-09-03 NOTE — Assessment & Plan Note (Signed)
The patient's device was interrogated.  The information was reviewed. No changes were made in the programming.    

## 2011-09-03 NOTE — Patient Instructions (Signed)
Your physician has recommended you make the following change in your medication:  1) Start Coreg (carvedilol) 3.125 mg by mouth twice daily.  Your physician recommends that you have lab work today: bmp  Your physician recommends that you schedule a follow-up appointment in: 3 months with Kristin/ Nevin Bloodgood for a device check.  Your physician wants you to follow-up in: 1 year with Dr. Caryl Comes. You will receive a reminder letter in the mail two months in advance. If you don't receive a letter, please call our office to schedule the follow-up appointment.

## 2011-09-04 ENCOUNTER — Other Ambulatory Visit (INDEPENDENT_AMBULATORY_CARE_PROVIDER_SITE_OTHER): Payer: Medicare Other

## 2011-09-04 DIAGNOSIS — E875 Hyperkalemia: Secondary | ICD-10-CM

## 2011-09-04 LAB — BASIC METABOLIC PANEL
Calcium: 9.3 mg/dL (ref 8.4–10.5)
Chloride: 108 mEq/L (ref 96–112)
Creatinine, Ser: 2.5 mg/dL — ABNORMAL HIGH (ref 0.4–1.5)
Sodium: 136 mEq/L (ref 135–145)

## 2011-10-07 ENCOUNTER — Telehealth: Payer: Self-pay | Admitting: Internal Medicine

## 2011-10-07 NOTE — Telephone Encounter (Signed)
Spoke with Dr Gilford Rile, a dentist. Pt needs1 tooth extracted. It would be done under local anesthesia in her office. Pt  had a hard time completing his health history. Dr Gilford Rile would like to know if any precautions should be taken for the extraction and if pt needs pre-med prior to extraction. I faxed a copy of Dr Olin Pia 08/2011 office note to Dr Gilford Rile at [phone].  I will forward to Dr Caryl Comes for review.

## 2011-10-07 NOTE — Telephone Encounter (Signed)
New problem:  Dr. Elbert Ewings calling patient has an appt on 4/24.  Advise on pre-med & health history.

## 2011-10-07 NOTE — Telephone Encounter (Signed)
A TOOTH EXTRACCTION SHOULD BE SAFELY DONE UNDER LOCAL  HE SHOULD NOT NEED PREMEDS

## 2011-10-08 NOTE — Telephone Encounter (Signed)
Spoke with Dr Gilford Rile. She is aware of Dr Olin Pia recommendations.

## 2011-10-30 ENCOUNTER — Telehealth: Payer: Self-pay | Admitting: *Deleted

## 2011-10-30 NOTE — Telephone Encounter (Signed)
Spoke to [place] per pt has been seen in office today with Elbert Ewings DDS, pt was given RX of pen v k that contains potassium and pt is concerned with taking this medication per recent hospitalization per potassium, please advise if this medication is safe for pt, best contact number [phone] to speak with Myriam Jacobson

## 2011-10-30 NOTE — Telephone Encounter (Signed)
Ok for him to take med- this should not effect his blood levels

## 2011-10-30 NOTE — Telephone Encounter (Signed)
Called Tyler Hahn to advise ok for pt to take medication with no effect to blood levels

## 2011-12-03 ENCOUNTER — Encounter: Payer: Self-pay | Admitting: Cardiology

## 2011-12-03 ENCOUNTER — Ambulatory Visit (INDEPENDENT_AMBULATORY_CARE_PROVIDER_SITE_OTHER): Payer: Medicare Other | Admitting: Cardiology

## 2011-12-03 VITALS — BP 140/78 | HR 73 | Ht 67.5 in | Wt 186.0 lb

## 2011-12-03 DIAGNOSIS — I429 Cardiomyopathy, unspecified: Secondary | ICD-10-CM

## 2011-12-03 NOTE — Progress Notes (Signed)
Patient seen in device clinic only

## 2011-12-04 ENCOUNTER — Other Ambulatory Visit: Payer: Self-pay

## 2011-12-04 LAB — ICD DEVICE OBSERVATION
AL THRESHOLD: 0.6 V
BAMS-0002: 8 ms
DEVICE MODEL ICD: 131093
RV LEAD AMPLITUDE: 9.6 mv
TZAT-0001FASTVT: 2
TZAT-0012FASTVT: 220 ms
TZAT-0018FASTVT: NEGATIVE
TZAT-0020FASTVT: 1 ms
TZON-0004FASTVT: 2.5
TZST-0001FASTVT: 5
TZST-0001FASTVT: 7
TZST-0003FASTVT: 21 J
TZST-0003FASTVT: 31 J
TZST-0003FASTVT: 41 J
VENTRICULAR PACING ICD: 1 pct

## 2011-12-05 ENCOUNTER — Telehealth: Payer: Self-pay | Admitting: *Deleted

## 2011-12-05 MED ORDER — ALLOPURINOL 100 MG PO TABS: 150.0000 mg | ORAL_TABLET | Freq: Every day | ORAL | Status: DC

## 2011-12-05 MED ORDER — GLUCOSE BLOOD VI STRP: ORAL_STRIP | Status: AC

## 2011-12-05 NOTE — Telephone Encounter (Signed)
Called Tyler Hahn to advise results from [place] per MD Birdie Riddle that the Tyler Hahn needs to increase his Allopurinol to 150mg , Tyler Hahn understood and requested the medication be sent to pharmacy [facility] [address] as well as test strips for his one touch ultra (life scan) meter, advised all prescriptions will be sent, sent all via escribe, Tyler Hahn understood.

## 2012-03-04 ENCOUNTER — Encounter: Payer: Self-pay | Admitting: Internal Medicine

## 2012-03-04 ENCOUNTER — Ambulatory Visit (INDEPENDENT_AMBULATORY_CARE_PROVIDER_SITE_OTHER): Payer: Medicare Other | Admitting: *Deleted

## 2012-03-04 DIAGNOSIS — I429 Cardiomyopathy, unspecified: Secondary | ICD-10-CM

## 2012-03-04 LAB — ICD DEVICE OBSERVATION
AL THRESHOLD: 0.5 V
ATRIAL PACING ICD: 1 pct
BAMS-0003: 70 {beats}/min
DEVICE MODEL ICD: 131093
HV IMPEDENCE: 50 Ohm
RV LEAD AMPLITUDE: 9.5 mv
TZAT-0001FASTVT: 2
TZAT-0013FASTVT: 2
TZAT-0018FASTVT: NEGATIVE
TZAT-0019FASTVT: 7.5 V
TZAT-0020FASTVT: 1 ms
TZON-0003FASTVT: 353 ms
TZON-0004FASTVT: 2.5
TZON-0005FASTVT: 1
TZST-0001FASTVT: 4
TZST-0001FASTVT: 5
TZST-0001FASTVT: 6
TZST-0001FASTVT: 8
TZST-0003FASTVT: 21 J
TZST-0003FASTVT: 31 J
TZST-0003FASTVT: 41 J
TZST-0003FASTVT: 41 J
VENTRICULAR PACING ICD: 1 pct

## 2012-03-04 NOTE — Progress Notes (Signed)
ICD check

## 2012-03-17 ENCOUNTER — Telehealth: Payer: Self-pay | Admitting: Family Medicine

## 2012-03-17 DIAGNOSIS — I429 Cardiomyopathy, unspecified: Secondary | ICD-10-CM

## 2012-03-17 MED ORDER — TORSEMIDE 20 MG PO TABS: 20.0000 mg | ORAL_TABLET | Freq: Every day | ORAL | Status: DC

## 2012-03-17 NOTE — Telephone Encounter (Signed)
TORSEMIDE 20 MG TABLET QTY: 30 LAST REFILL: 02/10/12 TAKE ONE TABLET BY MOUTH EVERY DAY

## 2012-03-17 NOTE — Telephone Encounter (Signed)
FYI: Pt last OV 08-15-11 for CPE was advised to follow up in 3 months to check DM/Cholesterol, noted some labs from other offices however still no OV with MD Birdie Riddle, sent one month refill with notation attached needs to call office for OV, mailed letter to advise in order to continue to receive medications/make sure they are working properly please call office to follow up

## 2012-03-17 NOTE — Telephone Encounter (Signed)
Agree w/ need for appt

## 2012-04-17 ENCOUNTER — Telehealth: Payer: Self-pay | Admitting: Family Medicine

## 2012-04-17 DIAGNOSIS — I429 Cardiomyopathy, unspecified: Secondary | ICD-10-CM

## 2012-04-17 MED ORDER — COLCHICINE 0.6 MG PO TABS: 0.6000 mg | ORAL_TABLET | ORAL | Status: DC | PRN

## 2012-04-17 NOTE — Telephone Encounter (Signed)
Refill: Colcrys 0.6mg  tab. Take one tablet by mouth as needed. Qty 30. Last fill 03-11-12

## 2012-04-17 NOTE — Telephone Encounter (Signed)
rx sent to pharmacy by e-script Left message to advise pt is overdue for follow up apt and needs to call office to schedule

## 2012-06-11 ENCOUNTER — Encounter: Payer: Medicare Other | Admitting: *Deleted

## 2012-06-24 ENCOUNTER — Telehealth: Payer: Self-pay | Admitting: Family Medicine

## 2012-06-24 MED ORDER — ALLOPURINOL 100 MG PO TABS: 150.0000 mg | ORAL_TABLET | Freq: Every day | ORAL | Status: DC

## 2012-06-24 NOTE — Telephone Encounter (Signed)
#  30, no refills.  Needs OV- way overdue for visit and labs

## 2012-06-24 NOTE — Telephone Encounter (Signed)
Please advise on RF request.//AB/CMA 

## 2012-06-24 NOTE — Telephone Encounter (Signed)
Refill: Allopurinol 100 mg tab. Take one and one half tablets by mouth every day. Qty 53. Last fill 04-28-12

## 2012-06-24 NOTE — Telephone Encounter (Signed)
Rx sent to the pharmacy by e-script.  Note was sent that the pt need OV.//AB/CMA

## 2012-06-25 ENCOUNTER — Encounter: Payer: Self-pay | Admitting: *Deleted

## 2012-06-25 ENCOUNTER — Ambulatory Visit (INDEPENDENT_AMBULATORY_CARE_PROVIDER_SITE_OTHER): Payer: Self-pay | Admitting: *Deleted

## 2012-06-25 DIAGNOSIS — I429 Cardiomyopathy, unspecified: Secondary | ICD-10-CM

## 2012-06-25 DIAGNOSIS — Z95810 Presence of automatic (implantable) cardiac defibrillator: Secondary | ICD-10-CM

## 2012-06-29 LAB — REMOTE ICD DEVICE
AL AMPLITUDE: 4.4 mv
CHARGE TIME: 9.4 s
HV IMPEDENCE: 51 Ohm
PACEART VT: 0
RV LEAD AMPLITUDE: 11.5 mv
RV LEAD IMPEDENCE ICD: 608 Ohm
TOT-0006: 20131219000000
TZAT-0001FASTVT: 1
TZAT-0001FASTVT: 2
TZAT-0002FASTVT: NEGATIVE
TZAT-0013FASTVT: 2
TZON-0003FASTVT: 352.9 ms
TZST-0001FASTVT: 4
TZST-0001FASTVT: 5
TZST-0001FASTVT: 8
TZST-0003FASTVT: 21 J
TZST-0003FASTVT: 31 J
TZST-0003FASTVT: 41 J
TZST-0003FASTVT: 41 J

## 2012-07-13 ENCOUNTER — Encounter: Payer: Self-pay | Admitting: *Deleted

## 2012-07-17 ENCOUNTER — Encounter: Payer: Self-pay | Admitting: Internal Medicine

## 2012-07-25 ENCOUNTER — Other Ambulatory Visit: Payer: Self-pay | Admitting: Family Medicine

## 2012-07-27 ENCOUNTER — Other Ambulatory Visit: Payer: Self-pay | Admitting: *Deleted

## 2012-07-27 DIAGNOSIS — M109 Gout, unspecified: Secondary | ICD-10-CM

## 2012-07-27 MED ORDER — ALLOPURINOL 100 MG PO TABS: 150.0000 mg | ORAL_TABLET | Freq: Every day | ORAL | Status: DC

## 2012-07-27 NOTE — Telephone Encounter (Signed)
Refill for allopurinol sent to [facility] #45 no RF

## 2012-08-17 ENCOUNTER — Ambulatory Visit (INDEPENDENT_AMBULATORY_CARE_PROVIDER_SITE_OTHER): Payer: No Typology Code available for payment source | Admitting: Family Medicine

## 2012-08-17 ENCOUNTER — Encounter: Payer: Self-pay | Admitting: Family Medicine

## 2012-08-17 VITALS — BP 120/80 | HR 69 | Temp 96.0°F | Ht 66.75 in | Wt 201.8 lb

## 2012-08-17 DIAGNOSIS — Z0000 Encounter for general adult medical examination without abnormal findings: Secondary | ICD-10-CM | POA: Insufficient documentation

## 2012-08-17 DIAGNOSIS — E785 Hyperlipidemia, unspecified: Secondary | ICD-10-CM

## 2012-08-17 DIAGNOSIS — N058 Unspecified nephritic syndrome with other morphologic changes: Secondary | ICD-10-CM

## 2012-08-17 DIAGNOSIS — M25529 Pain in unspecified elbow: Secondary | ICD-10-CM

## 2012-08-17 DIAGNOSIS — M25521 Pain in right elbow: Secondary | ICD-10-CM | POA: Insufficient documentation

## 2012-08-17 DIAGNOSIS — E1129 Type 2 diabetes mellitus with other diabetic kidney complication: Secondary | ICD-10-CM

## 2012-08-17 DIAGNOSIS — I10 Essential (primary) hypertension: Secondary | ICD-10-CM

## 2012-08-17 LAB — HEPATIC FUNCTION PANEL
ALT: 21 U/L (ref 0–53)
AST: 19 U/L (ref 0–37)
Albumin: 3.8 g/dL (ref 3.5–5.2)
Alkaline Phosphatase: 71 U/L (ref 39–117)
Total Bilirubin: 0.5 mg/dL (ref 0.3–1.2)

## 2012-08-17 LAB — CBC WITH DIFFERENTIAL/PLATELET
Eosinophils Relative: 6.2 % — ABNORMAL HIGH (ref 0.0–5.0)
Lymphocytes Relative: 26.7 % (ref 12.0–46.0)
MCV: 84.8 fl (ref 78.0–100.0)
Monocytes Absolute: 0.4 10*3/uL (ref 0.1–1.0)
Neutrophils Relative %: 59.7 % (ref 43.0–77.0)
Platelets: 146 10*3/uL — ABNORMAL LOW (ref 150.0–400.0)
RBC: 4.06 Mil/uL — ABNORMAL LOW (ref 4.22–5.81)
WBC: 6.1 10*3/uL (ref 4.5–10.5)

## 2012-08-17 LAB — LIPID PANEL
HDL: 32.1 mg/dL — ABNORMAL LOW (ref >39.00)
VLDL: 35.6 mg/dL (ref 0.0–40.0)

## 2012-08-17 LAB — BASIC METABOLIC PANEL
Chloride: 112 mEq/L (ref 96–112)
GFR: 30.77 mL/min — ABNORMAL LOW (ref >60.00)
Glucose, Bld: 128 mg/dL — ABNORMAL HIGH (ref 70–99)
Potassium: 5.7 mEq/L — ABNORMAL HIGH (ref 3.5–5.1)
Sodium: 138 mEq/L (ref 135–145)

## 2012-08-17 LAB — HEMOGLOBIN A1C: Hgb A1c MFr Bld: 7.6 % — ABNORMAL HIGH (ref 4.6–6.5)

## 2012-08-17 MED ORDER — DICLOFENAC SODIUM 1 % TD GEL: 2.0000 g | Freq: Four times a day (QID) | TRANSDERMAL | Status: DC

## 2012-08-17 NOTE — Progress Notes (Signed)
Subjective:    Patient ID: Tyler Hahn, male    DOB: [birth year], 72 y.o.   MRN: [id number]  HPI Here today for CPE.  Risk Factors: HTN- chronic problem, well controlled today.  On Coreg and Lisinopril.  No CP, SOB, HAs, visual changes, edema Hyperlipidemia- chronic problem, previously on Lipitor.  Pt doesn't currently have bottle.  Unclear when he stopped or why. DM- chronic problem, UTD on eye exam.  CBGs 'pretty good'.  Now having symptomatic lows.  On Januvia daily.  No numbness or tingling in hands/feet. R elbow pain- banged elbow while lifting wife ~3 months ago.  No swelling but still having pain w/ motion.  Pain is intermittent.  Has hx of gout.   Physical Activity: walking regularly, physically active due to wife's condition Depression: no current depression Hearing: normal to conversational tones and whispered voice at 6 ft ADL's: independent Cognitive: normal linear thought process, memory and attention intact Home Safety: safe at home, lives w/ wife. Height, Weight, BMI, Visual Acuity: see vitals, vision corrected to 20/20 w/ glasses Counseling: 'i don't want it' in regards to colonoscopy Labs Ordered: See A&P Care Plan: See A&P    Review of Systems Patient reports no vision/hearing changes, anorexia, fever ,adenopathy, persistant/recurrent hoarseness, swallowing issues, chest pain, palpitations, edema, persistant/recurrent cough, hemoptysis, dyspnea (rest,exertional, paroxysmal nocturnal), gastrointestinal  bleeding (melena, rectal bleeding), abdominal pain, excessive heart burn, GU symptoms (dysuria, hematuria, voiding/incontinence issues) syncope, focal weakness, memory loss, numbness & tingling, skin/hair/nail changes, depression, anxiety, abnormal bruising/bleeding.     Objective:   Physical Exam BP 120/80  Pulse 69  Temp(Src) 96 F (35.6 C) (Tympanic)  Ht 5' 6.75" (1.695 m)  Wt 201 lb 12.8 oz (91.536 kg)  BMI 31.86 kg/m2  SpO2 98%  General Appearance:     Alert, cooperative, no distress, appears stated age  Head:    Normocephalic, without obvious abnormality, atraumatic  Eyes:    PERRL, conjunctiva/corneas clear, EOM's intact, fundi    benign, both eyes       Ears:    Normal TM's and external ear canals, both ears  Nose:   Nares normal, septum midline, mucosa normal, no drainage   or sinus tenderness  Throat:   Lips, mucosa, and tongue normal; teeth and gums normal  Neck:   Supple, symmetrical, trachea midline, no adenopathy;       thyroid:  No enlargement/tenderness/nodules  Back:     Symmetric, no curvature, ROM normal, no CVA tenderness  Lungs:     Clear to auscultation bilaterally, respirations unlabored  Chest wall:    No tenderness or deformity  Heart:    Regular rate and rhythm, S1 and S2 normal, no murmur, rub   or gallop  Abdomen:     Soft, non-tender, bowel sounds active all four quadrants,    no masses, no organomegaly  Genitalia:    Normal male without lesion, discharge or tenderness.  Small epididymal cyst no R  Rectal:    Normal tone, normal prostate, no masses or tenderness;   guaiac negative stool  Extremities:   Extremities normal, atraumatic, no cyanosis or edema.  + TTP over R medial epicondyle  Pulses:   2+ and symmetric all extremities  Skin:   Skin color, texture, turgor normal, no rashes or lesions  Lymph nodes:   Cervical, supraclavicular, and axillary nodes normal  Neurologic:   CNII-XII intact. Normal strength, sensation and reflexes      throughout  Assessment & Plan:

## 2012-08-17 NOTE — Patient Instructions (Signed)
Follow up in 4 months to recheck sugar We'll notify you of your lab results and make any changes if needed Someone will call you with your sports med appt Use the Voltaren gel on the elbow as needed for pain Call with any questions or concerns Drive safe!

## 2012-08-18 NOTE — Assessment & Plan Note (Signed)
Chronic problem.  Pt has stopped lipitor w/out obvious reason why.  Recheck labs.  Determine if pt needs to restart.

## 2012-08-18 NOTE — Assessment & Plan Note (Signed)
Pt's PE WNL.  Pt declines colonoscopy.  Check labs.  Encouraged importance of regular f/u.  Anticipatory guidance provided.

## 2012-08-18 NOTE — Assessment & Plan Note (Signed)
Chronic problem, UTD on eye exam.  Not following up regularly- stressed the importance of regular appts.  Pt having occasional symptomatic lows- discussed eating regularly.  Check labs.  Adjust meds prn

## 2012-08-18 NOTE — Assessment & Plan Note (Signed)
Chronic problem, adequate control.  Asymptomatic.  Check labs.  No anticipated changes.

## 2012-08-18 NOTE — Assessment & Plan Note (Signed)
New.  Pt w/ bony tenderness over R medial epicondyle.  No current evidence of gout.  Refer to sports med for complete evaluation.

## 2012-08-20 ENCOUNTER — Telehealth: Payer: Self-pay | Admitting: *Deleted

## 2012-08-20 DIAGNOSIS — E875 Hyperkalemia: Secondary | ICD-10-CM

## 2012-08-20 MED ORDER — ATORVASTATIN CALCIUM 20 MG PO TABS: 20.0000 mg | ORAL_TABLET | Freq: Every day | ORAL | Status: DC

## 2012-08-20 NOTE — Telephone Encounter (Signed)
Message copied by Harl Bowie on Thu Aug 20, 2012  4:02 PM ------      Message from: [name]      Created: Tue Aug 18, 2012 10:13 AM       Pt's LDL is higher than goal, based on this he needs to start Lipitor 20mg  nightly      K+ is high- needs to repeat labs later this week (thurs or fri BMP dx hyperkalemia)      A1C is higher than previous (7.1 --> 7.6) needs to pay attention to low carb diet      Remainder of labs stable. ------

## 2012-08-20 NOTE — Telephone Encounter (Signed)
Spoke with the pt and informed him of his recent lab results and note.  Pt understood and agreed.  New rx was sent to the pharmacy([facility]) by e-script.  Pt scheduled for lab appt on Friday 08/21/12.//AB/CMA

## 2012-08-21 ENCOUNTER — Ambulatory Visit: Payer: No Typology Code available for payment source | Admitting: Family Medicine

## 2012-08-21 ENCOUNTER — Other Ambulatory Visit: Payer: No Typology Code available for payment source

## 2012-08-24 ENCOUNTER — Ambulatory Visit (INDEPENDENT_AMBULATORY_CARE_PROVIDER_SITE_OTHER): Payer: No Typology Code available for payment source | Admitting: Family Medicine

## 2012-08-24 ENCOUNTER — Other Ambulatory Visit: Payer: No Typology Code available for payment source

## 2012-08-24 ENCOUNTER — Encounter: Payer: Self-pay | Admitting: Family Medicine

## 2012-08-24 VITALS — BP 158/90 | HR 79 | Ht 68.0 in | Wt 201.0 lb

## 2012-08-24 DIAGNOSIS — G5620 Lesion of ulnar nerve, unspecified upper limb: Secondary | ICD-10-CM

## 2012-08-24 NOTE — Patient Instructions (Addendum)
Try some asperceme  When you run out of the voltaren gel. See [place] back if this does not get better

## 2012-08-25 ENCOUNTER — Other Ambulatory Visit: Payer: No Typology Code available for payment source

## 2012-08-25 ENCOUNTER — Encounter: Payer: Self-pay | Admitting: Family Medicine

## 2012-08-25 DIAGNOSIS — G5620 Lesion of ulnar nerve, unspecified upper limb: Secondary | ICD-10-CM | POA: Insufficient documentation

## 2012-08-25 NOTE — Progress Notes (Signed)
  Subjective:    Patient ID: Tyler Hahn, male    DOB: [birth year], 72 y.o.   MRN: [id number]  HPI Left elbow pain for the last 3-4 weeks. He may have hurt it lifting his wife he recently had a stroke. Pain is intermittent. 4-6 out 10 at its worst sharp. Lasts a few minutes. Sunday she doesn't have it all. At the moment is not having any pain.   Review of Systems Denies numbness in the left hand or arm    Objective:   Physical Exam Vital signs are reviewed General: Well-developed male no acute distress ELBOW: Right. Full range of motion flexion and extension. To palpation over the lateral epicondyle. HANDS: Very noticeable is atrophy of the first interosseous muscles and call deformity on the right hand with flexion contracture of the fourth and fifth digit. Positive fromment sign on the right. Intact sensation in the first web space. ;[place] Mild arthritic changes at the origin of the extensor muscle mass on the right. No fluid is noted.       Assessment & Plan:  #1. Mild lateral epicondylitis. We discussed options and he chose home exercise program. Been using voltaren gell that he had and can continue that. #2. 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 This is not bothering him but was noted.

## 2012-08-27 ENCOUNTER — Other Ambulatory Visit: Payer: No Typology Code available for payment source

## 2012-09-05 ENCOUNTER — Other Ambulatory Visit: Payer: Self-pay | Admitting: Family Medicine

## 2012-09-08 ENCOUNTER — Encounter: Payer: Medicare Other | Admitting: Internal Medicine

## 2012-09-15 ENCOUNTER — Ambulatory Visit (INDEPENDENT_AMBULATORY_CARE_PROVIDER_SITE_OTHER): Payer: PRIVATE HEALTH INSURANCE | Admitting: Internal Medicine

## 2012-09-15 ENCOUNTER — Encounter: Payer: Self-pay | Admitting: Internal Medicine

## 2012-09-15 VITALS — BP 140/70 | HR 84 | Ht 68.0 in | Wt 199.8 lb

## 2012-09-15 DIAGNOSIS — N259 Disorder resulting from impaired renal tubular function, unspecified: Secondary | ICD-10-CM

## 2012-09-15 DIAGNOSIS — I429 Cardiomyopathy, unspecified: Secondary | ICD-10-CM

## 2012-09-15 DIAGNOSIS — I5022 Chronic systolic (congestive) heart failure: Secondary | ICD-10-CM

## 2012-09-15 DIAGNOSIS — Z95810 Presence of automatic (implantable) cardiac defibrillator: Secondary | ICD-10-CM

## 2012-09-15 LAB — ICD DEVICE OBSERVATION
AL AMPLITUDE: 4.1 mv
AL THRESHOLD: 0.8 V
DEV-0020ICD: NEGATIVE
DEVICE MODEL ICD: 131093
HV IMPEDENCE: 54 Ohm
RV LEAD AMPLITUDE: 9.3 mv
RV LEAD THRESHOLD: 0.4 V
TZAT-0001FASTVT: 2
TZAT-0018FASTVT: NEGATIVE
TZST-0001FASTVT: 7
TZST-0001FASTVT: 8
TZST-0003FASTVT: 31 J
TZST-0003FASTVT: 41 J

## 2012-09-15 NOTE — Assessment & Plan Note (Signed)
With his hyperkalemia, it may be time to stop his lisinopril.  We will get an echo

## 2012-09-15 NOTE — Assessment & Plan Note (Signed)
The patient's device was interrogated.  The information was reviewed. No changes were made in the programming.    

## 2012-09-15 NOTE — Assessment & Plan Note (Signed)
euvolemic continue meds  Will recheck BMET today

## 2012-09-15 NOTE — Progress Notes (Signed)
Patient Care Team: [name], MD as PCP - General   HPI  Tyler Hahn is a 72 y.o. 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 Therapy has been complicated by renal insufficiency  Most recent cr 2.3 and potassium 4 weeks ago was 5.7.   He denies significant shortness of breath or peripheral edema; he has no orthopnea or nocturnal dyspnea. He is caring for his wife.   Past Medical History  Diagnosis Date  . Hypertension   . Diabetes mellitus   . Hyperlipidemia     No past surgical history on file.  Current Outpatient Prescriptions  Medication Sig Dispense Refill  . allopurinol (ZYLOPRIM) 100 MG tablet Take 1.5 tablets (150 mg total) by mouth daily.  45 tablet  0  . atorvastatin (LIPITOR) 20 MG tablet Take 1 tablet (20 mg total) by mouth daily.  30 tablet  3  . carvedilol (COREG) 6.25 MG tablet Take 6.25 mg by mouth 2 (two) times daily with a meal.      . colchicine 0.6 MG tablet Take 1 tablet (0.6 mg total) by mouth as needed.  30 tablet  0  . diclofenac sodium (VOLTAREN) 1 % GEL Apply 2 g topically 4 (four) times daily.  100 g  3  . fluticasone (FLONASE) 50 MCG/ACT nasal spray Place 2 sprays into the nose as directed.        Marland Kitchen glucose blood test strip Use as instructed  100 each  12  . JANUVIA 50 MG tablet TAKE ONE TABLET BY MOUTH EVERY DAY  30 tablet  5  . lisinopril (PRINIVIL,ZESTRIL) 20 MG tablet TAKE ONE TABLET BY MOUTH EVERY DAY  30 tablet  5  . Omega-3 Fatty Acids (FISH OIL) 1200 MG CAPS Take by mouth daily.        . sildenafil (VIAGRA) 100 MG tablet Take 100 mg by mouth as needed.        . torsemide (DEMADEX) 20 MG tablet Take 1 tablet (20 mg total) by mouth daily.  30 tablet  0  . [DISCONTINUED] atenolol (TENORMIN) 50 MG tablet Take 75 mg by mouth daily.        . [DISCONTINUED] Insulin Glargine (LANTUS SOLOSTAR [place]) Inject into the skin as directed.         No current facility-administered medications  for this visit.    Allergies  Allergen Reactions  . Sulfonamide Derivatives     REACTION: itching    Review of Systems negative except from HPI and PMH  Physical Exam BP 140/70  Pulse 84  Ht 5\' 8"  (1.727 m)  Wt 199 lb 12.8 oz (90.629 kg)  BMI 30.39 kg/m2  SpO2 97% Well developed and well nourished in no acute distress HENT normal E scleral and icterus clear Neck Supple JVP flat; carotids brisk and full Clear to ausculation regular rate and rhythm Early systolic   murmurs no rub Soft with active bowel sounds No clubbing cyanosis none Edema Alert and oriented, grossly normal motor and sensory function Skin Warm and Dry    Assessment and  Plan

## 2012-09-15 NOTE — Assessment & Plan Note (Signed)
Improved  Cr 2.3

## 2012-09-15 NOTE — Patient Instructions (Addendum)
Your physician recommends that you have lab work today: bmp  Your physician has requested that you have an echocardiogram. Echocardiography is a painless test that uses sound waves to create images of your heart. It provides your doctor with information about the size and shape of your heart and how well your heart's chambers and valves are working. This procedure takes approximately one hour. There are no restrictions for this procedure.  Remote monitoring is used to monitor your Pacemaker of ICD from home. This monitoring reduces the number of office visits required to check your device to one time per year. It allows [place] to keep an eye on the functioning of your device to ensure it is working properly. You are scheduled for a device check from home on 12/17/12. You may send your transmission at any time that day. If you have a wireless device, the transmission will be sent automatically. After your physician reviews your transmission, you will receive a postcard with your next transmission date.  Your physician wants you to follow-up in: 1 year with Dr. Caryl Comes. You will receive a reminder letter in the mail two months in advance. If you don't receive a letter, please call our office to schedule the follow-up appointment.

## 2012-09-17 ENCOUNTER — Other Ambulatory Visit (INDEPENDENT_AMBULATORY_CARE_PROVIDER_SITE_OTHER): Payer: No Typology Code available for payment source

## 2012-09-17 ENCOUNTER — Ambulatory Visit (HOSPITAL_COMMUNITY): Payer: PRIVATE HEALTH INSURANCE | Attending: Internal Medicine | Admitting: Radiology

## 2012-09-17 DIAGNOSIS — I5022 Chronic systolic (congestive) heart failure: Secondary | ICD-10-CM

## 2012-09-17 DIAGNOSIS — E875 Hyperkalemia: Secondary | ICD-10-CM

## 2012-09-17 DIAGNOSIS — I509 Heart failure, unspecified: Secondary | ICD-10-CM | POA: Insufficient documentation

## 2012-09-17 DIAGNOSIS — I429 Cardiomyopathy, unspecified: Secondary | ICD-10-CM

## 2012-09-17 LAB — BASIC METABOLIC PANEL
Chloride: 108 mEq/L (ref 96–112)
GFR: 24.3 mL/min — ABNORMAL LOW (ref >60.00)
Glucose, Bld: 120 mg/dL — ABNORMAL HIGH (ref 70–99)
Potassium: 5.1 mEq/L (ref 3.5–5.1)
Sodium: 136 mEq/L (ref 135–145)

## 2012-09-17 NOTE — Progress Notes (Signed)
Echocardiogram performed by Cindy Hazy.

## 2012-09-22 ENCOUNTER — Other Ambulatory Visit: Payer: Self-pay | Admitting: Family Medicine

## 2012-09-24 ENCOUNTER — Encounter (INDEPENDENT_AMBULATORY_CARE_PROVIDER_SITE_OTHER): Payer: PRIVATE HEALTH INSURANCE

## 2012-09-24 DIAGNOSIS — I701 Atherosclerosis of renal artery: Secondary | ICD-10-CM

## 2012-09-24 DIAGNOSIS — N184 Chronic kidney disease, stage 4 (severe): Secondary | ICD-10-CM

## 2012-10-11 ENCOUNTER — Other Ambulatory Visit: Payer: Self-pay | Admitting: Family Medicine

## 2012-10-12 NOTE — Telephone Encounter (Signed)
Med filled.  

## 2012-11-12 ENCOUNTER — Other Ambulatory Visit: Payer: Self-pay | Admitting: Family Medicine

## 2012-11-12 NOTE — Telephone Encounter (Signed)
Med filled.  

## 2012-12-15 ENCOUNTER — Other Ambulatory Visit: Payer: Self-pay | Admitting: Family Medicine

## 2012-12-16 NOTE — Telephone Encounter (Signed)
Refill done per protocol.  

## 2012-12-17 ENCOUNTER — Encounter: Payer: No Typology Code available for payment source | Admitting: *Deleted

## 2012-12-24 ENCOUNTER — Ambulatory Visit (INDEPENDENT_AMBULATORY_CARE_PROVIDER_SITE_OTHER): Payer: PRIVATE HEALTH INSURANCE | Admitting: *Deleted

## 2012-12-24 ENCOUNTER — Encounter: Payer: Self-pay | Admitting: Internal Medicine

## 2012-12-24 DIAGNOSIS — Z95810 Presence of automatic (implantable) cardiac defibrillator: Secondary | ICD-10-CM

## 2012-12-24 DIAGNOSIS — I429 Cardiomyopathy, unspecified: Secondary | ICD-10-CM

## 2012-12-26 LAB — REMOTE ICD DEVICE
AL AMPLITUDE: 2.7 mv
DEV-0020ICD: NEGATIVE
HV IMPEDENCE: 48 Ohm
PACEART VT: 0
TZAT-0001FASTVT: 1
TZAT-0013FASTVT: 2
TZAT-0018FASTVT: NEGATIVE
TZST-0001FASTVT: 3
TZST-0001FASTVT: 4
TZST-0003FASTVT: 21 J
TZST-0003FASTVT: 41 J
TZST-0003FASTVT: 41 J
VENTRICULAR PACING ICD: 0 pct
VF: 0

## 2013-01-07 ENCOUNTER — Other Ambulatory Visit: Payer: Self-pay | Admitting: Family Medicine

## 2013-01-08 ENCOUNTER — Other Ambulatory Visit: Payer: Self-pay | Admitting: *Deleted

## 2013-01-08 MED ORDER — ATORVASTATIN CALCIUM 20 MG PO TABS: 20.0000 mg | ORAL_TABLET | Freq: Every day | ORAL | Status: DC

## 2013-01-08 NOTE — Telephone Encounter (Signed)
Rx has been filled Lipitor 20 mg 30 ct 3 refills 01/08/13

## 2013-03-11 ENCOUNTER — Ambulatory Visit (INDEPENDENT_AMBULATORY_CARE_PROVIDER_SITE_OTHER): Payer: PRIVATE HEALTH INSURANCE | Admitting: *Deleted

## 2013-03-11 DIAGNOSIS — I428 Other cardiomyopathies: Secondary | ICD-10-CM

## 2013-03-11 LAB — ICD DEVICE OBSERVATION
DEV-0020ICD: NEGATIVE
DEVICE MODEL ICD: 131093
TZAT-0001FASTVT: 1
TZAT-0001FASTVT: 2
TZAT-0002FASTVT: NEGATIVE
TZAT-0013FASTVT: 2
TZAT-0018FASTVT: NEGATIVE
TZAT-0018FASTVT: NEGATIVE
TZON-0003FASTVT: 352.9 ms
TZST-0001FASTVT: 3
TZST-0001FASTVT: 4
TZST-0001FASTVT: 5
TZST-0001FASTVT: 6
TZST-0001FASTVT: 7
TZST-0001FASTVT: 8
TZST-0003FASTVT: 21 J
TZST-0003FASTVT: 31 J
TZST-0003FASTVT: 41 J
TZST-0003FASTVT: 41 J
TZST-0003FASTVT: 41 J
TZST-0003FASTVT: 41 J

## 2013-03-11 NOTE — Progress Notes (Signed)
Software updated per advisory. Follow up as planned.

## 2013-03-19 ENCOUNTER — Encounter: Payer: Self-pay | Admitting: Internal Medicine

## 2013-03-25 ENCOUNTER — Encounter: Payer: Self-pay | Admitting: Internal Medicine

## 2013-03-25 ENCOUNTER — Ambulatory Visit (INDEPENDENT_AMBULATORY_CARE_PROVIDER_SITE_OTHER): Payer: PRIVATE HEALTH INSURANCE | Admitting: *Deleted

## 2013-03-25 DIAGNOSIS — Z95810 Presence of automatic (implantable) cardiac defibrillator: Secondary | ICD-10-CM

## 2013-03-25 DIAGNOSIS — I429 Cardiomyopathy, unspecified: Secondary | ICD-10-CM

## 2013-03-25 LAB — REMOTE ICD DEVICE
AL IMPEDENCE ICD: 414 Ohm
ATRIAL PACING ICD: 0 pct
DEV-0020ICD: NEGATIVE
TZAT-0001FASTVT: 2
TZAT-0002FASTVT: NEGATIVE
TZON-0003FASTVT: 352.9 ms
TZST-0001FASTVT: 4
TZST-0001FASTVT: 7
TZST-0001FASTVT: 8
TZST-0003FASTVT: 21 J
TZST-0003FASTVT: 41 J
TZST-0003FASTVT: 41 J

## 2013-03-30 ENCOUNTER — Encounter: Payer: Self-pay | Admitting: *Deleted

## 2013-04-19 ENCOUNTER — Encounter: Payer: Self-pay | Admitting: Family Medicine

## 2013-04-19 ENCOUNTER — Ambulatory Visit (INDEPENDENT_AMBULATORY_CARE_PROVIDER_SITE_OTHER): Payer: PRIVATE HEALTH INSURANCE | Admitting: Family Medicine

## 2013-04-19 VITALS — BP 132/78 | HR 99 | Temp 98.2°F | Resp 16 | Wt 175.1 lb

## 2013-04-19 DIAGNOSIS — E785 Hyperlipidemia, unspecified: Secondary | ICD-10-CM

## 2013-04-19 DIAGNOSIS — I10 Essential (primary) hypertension: Secondary | ICD-10-CM

## 2013-04-19 DIAGNOSIS — N058 Unspecified nephritic syndrome with other morphologic changes: Secondary | ICD-10-CM

## 2013-04-19 DIAGNOSIS — R634 Abnormal weight loss: Secondary | ICD-10-CM

## 2013-04-19 DIAGNOSIS — M2550 Pain in unspecified joint: Secondary | ICD-10-CM

## 2013-04-19 DIAGNOSIS — E1129 Type 2 diabetes mellitus with other diabetic kidney complication: Secondary | ICD-10-CM

## 2013-04-19 DIAGNOSIS — R630 Anorexia: Secondary | ICD-10-CM

## 2013-04-19 LAB — CBC WITH DIFFERENTIAL/PLATELET
Basophils Absolute: 0 10*3/uL (ref 0.0–0.1)
Eosinophils Absolute: 0.4 10*3/uL (ref 0.0–0.7)
Eosinophils Relative: 5.8 % — ABNORMAL HIGH (ref 0.0–5.0)
HCT: 30.2 % — ABNORMAL LOW (ref 39.0–52.0)
Lymphs Abs: 1.3 10*3/uL (ref 0.7–4.0)
MCHC: 32.3 g/dL (ref 30.0–36.0)
MCV: 83.2 fl (ref 78.0–100.0)
Monocytes Absolute: 0.5 10*3/uL (ref 0.1–1.0)
Neutrophils Relative %: 64.3 % (ref 43.0–77.0)
Platelets: 151 10*3/uL (ref 150.0–400.0)
RDW: 12.9 % (ref 11.5–14.6)

## 2013-04-19 LAB — SEDIMENTATION RATE: Sed Rate: 76 mm/hr — ABNORMAL HIGH (ref 0–22)

## 2013-04-19 LAB — LIPID PANEL
Cholesterol: 101 mg/dL (ref 0–200)
HDL: 25.6 mg/dL — ABNORMAL LOW (ref >39.00)
VLDL: 38.8 mg/dL (ref 0.0–40.0)

## 2013-04-19 LAB — HEPATIC FUNCTION PANEL
Albumin: 3.5 g/dL (ref 3.5–5.2)
Alkaline Phosphatase: 97 U/L (ref 39–117)
Total Bilirubin: 0.5 mg/dL (ref 0.3–1.2)

## 2013-04-19 LAB — BASIC METABOLIC PANEL
Calcium: 9.4 mg/dL (ref 8.4–10.5)
GFR: 30.25 mL/min — ABNORMAL LOW (ref >60.00)
Glucose, Bld: 170 mg/dL — ABNORMAL HIGH (ref 70–99)
Potassium: 5 mEq/L (ref 3.5–5.1)
Sodium: 138 mEq/L (ref 135–145)

## 2013-04-19 LAB — RHEUMATOID FACTOR: Rhuematoid fact SerPl-aCnc: <10 IU/mL (ref <=14)

## 2013-04-19 LAB — HEMOGLOBIN A1C: Hgb A1c MFr Bld: 7.4 % — ABNORMAL HIGH (ref 4.6–6.5)

## 2013-04-19 LAB — CK: Total CK: 110 U/L (ref 7–232)

## 2013-04-19 LAB — TSH: TSH: 0.02 u[IU]/mL — ABNORMAL LOW (ref 0.35–5.50)

## 2013-04-19 MED ORDER — PREDNISONE 10 MG PO TABS: ORAL_TABLET | ORAL | Status: DC

## 2013-04-19 NOTE — Progress Notes (Signed)
  Subjective:    Patient ID: Tyler Hahn, male    DOB: Nov 19, 1940, 72 y.o.   MRN: [id number]  HPI polyarthralgia- new.  Hx of gout.  'i hurt in my knees, my hips, and my low back'.  sxs started 'a couple of weeks ago'.  No change in activity level.  DM- chronic problem, on Januvia, Lisinopril.  Hx of renal insufficiency.  Has lost 25 lbs since last visit.  Not trying to lose weight.  Reports he's been eating up until the last 3-4 days and he switched to ensure.  Sugar has been well controlled.  No cough.  No fevers.  No CP.  UTD on eye exam.  HTN- chronic problem, on Lisinopril, torsemide, Coreg.  No CP, mild SOB.  No HAs, visual changes  Hyperlipidemia- chronic problem, on Lipitor.  Denies abd pain, N/V.  + myalgias.   Review of Systems For ROS see HPI     Objective:   Physical Exam  Vitals reviewed. Constitutional: He is oriented to person, place, and time. No distress.  Pt thin and frail  HENT:  Head: Normocephalic and atraumatic.  Neck: Neck supple. No thyromegaly present.  Cardiovascular: Normal rate, regular rhythm, normal heart sounds and intact distal pulses.   Pulmonary/Chest: Effort normal and breath sounds normal. No respiratory distress. He has no wheezes. He has no rales.  Abdominal: Soft. Bowel sounds are normal. He exhibits no distension. There is no tenderness. There is no rebound.  Musculoskeletal:  Diffuse body aches  Lymphadenopathy:    He has no cervical adenopathy.  Neurological: He is alert and oriented to person, place, and time.  Skin: Skin is warm and dry.  Psychiatric: He has a normal mood and affect. His behavior is normal.          Assessment & Plan:

## 2013-04-19 NOTE — Patient Instructions (Signed)
Follow up in 1 month to recheck weight We'll notify you of your lab results and make any changes if needed We'll call you with your GI appt Start the Prednisone as directed for the joint pains and muscle aches- take w/ food or Ensure Try and eat small but regular meals- add the Ensure as needed Call with any questions or concerns Hang in there!!!

## 2013-04-20 ENCOUNTER — Encounter: Payer: Self-pay | Admitting: General Practice

## 2013-04-20 ENCOUNTER — Other Ambulatory Visit: Payer: Self-pay | Admitting: Family Medicine

## 2013-04-20 ENCOUNTER — Ambulatory Visit: Payer: PRIVATE HEALTH INSURANCE

## 2013-04-20 DIAGNOSIS — R6889 Other general symptoms and signs: Secondary | ICD-10-CM

## 2013-04-20 DIAGNOSIS — R7989 Other specified abnormal findings of blood chemistry: Secondary | ICD-10-CM

## 2013-04-20 LAB — T3, FREE: T3, Free: 10.7 pg/mL — ABNORMAL HIGH (ref 2.3–4.2)

## 2013-04-20 NOTE — Assessment & Plan Note (Signed)
Chronic problem.  Adequate control.  No anticipated med changes.

## 2013-04-20 NOTE — Assessment & Plan Note (Signed)
New.  Check labs to r/o rheumatoid, gout, statin induced myalgia.  Prednisone for pain relief.  Will follow.

## 2013-04-20 NOTE — Assessment & Plan Note (Signed)
Chronic problem.  Pt has not been compliant w/ f/u.  Check labs.  Adjust regimen prn- careful not to drop sugar too low now that pt is not eating regularly.

## 2013-04-20 NOTE — Assessment & Plan Note (Signed)
New.  Pt has lost 25 lbs since last visit w/out trying.  + anorexia- no longer eating solid food and drinking Ensure.  Pt has no hx of smoking but has never had appropriate colon cancer screening ('i don't want it') and has hx of testicular mass that pt has refused work up ('it's been there forever and not doing anything').  Concern for possible underlying malignancy.  Check labs.  Will refer to GI and follow closely.

## 2013-04-20 NOTE — Telephone Encounter (Signed)
Med filled.  

## 2013-04-20 NOTE — Assessment & Plan Note (Signed)
Chronic problem.  On Lipitor.  Pt having multiple joint and muscle pains.  Check CK to ensure tolerability.

## 2013-04-20 NOTE — Assessment & Plan Note (Signed)
New.  Pt has lost 25 lbs since last visit.  Reports within last few days, he has been unable to eat solid food and now is drinking Ensure.  Check labs to r/o metabolic cause, refer to GI for complete evaluation.

## 2013-04-22 ENCOUNTER — Other Ambulatory Visit: Payer: Self-pay

## 2013-04-27 ENCOUNTER — Encounter: Payer: Self-pay | Admitting: Gastroenterology

## 2013-04-28 ENCOUNTER — Other Ambulatory Visit: Payer: Self-pay | Admitting: *Deleted

## 2013-04-28 ENCOUNTER — Other Ambulatory Visit (INDEPENDENT_AMBULATORY_CARE_PROVIDER_SITE_OTHER): Payer: PRIVATE HEALTH INSURANCE

## 2013-04-28 DIAGNOSIS — D649 Anemia, unspecified: Secondary | ICD-10-CM

## 2013-05-05 ENCOUNTER — Ambulatory Visit (INDEPENDENT_AMBULATORY_CARE_PROVIDER_SITE_OTHER): Payer: PRIVATE HEALTH INSURANCE | Admitting: Endocrinology

## 2013-05-05 ENCOUNTER — Encounter: Payer: Self-pay | Admitting: Endocrinology

## 2013-05-05 ENCOUNTER — Telehealth: Payer: Self-pay | Admitting: *Deleted

## 2013-05-05 VITALS — BP 102/68 | HR 85 | Temp 98.3°F | Resp 12 | Ht 67.75 in | Wt 178.3 lb

## 2013-05-05 DIAGNOSIS — E0590 Thyrotoxicosis, unspecified without thyrotoxic crisis or storm: Secondary | ICD-10-CM

## 2013-05-05 MED ORDER — METHIMAZOLE 10 MG PO TABS: 20.0000 mg | ORAL_TABLET | Freq: Two times a day (BID) | ORAL | Status: DC

## 2013-05-05 NOTE — Telephone Encounter (Signed)
Pt's son called, he states that the new rx you put him on has a $45 copay, he wants to know if there is something else that's cheaper that you can put him on.

## 2013-05-05 NOTE — Telephone Encounter (Signed)
That is very expensive.  It is generic.  Did they give you brand-name?   If this is your generic copay, we can change to an alternative, so please let me know.

## 2013-05-05 NOTE — Progress Notes (Signed)
Subjective:    Patient ID: Tyler Hahn, male    DOB: Jun 19, 1940, 72 y.o.   MRN: [id number]  HPI Pt states few months of slight tremor of the hands, and assoc weight loss. Past Medical History  Diagnosis Date  . Hypertension   . Diabetes mellitus   . Hyperlipidemia     No past surgical history on file.  History   Social History  . Marital Status: Married    Spouse Name: N/A    Number of Children: N/A  . Years of Education: N/A   Occupational History  . Not on file.   Social History Main Topics  . Smoking status: Never Smoker   . Smokeless tobacco: Not on file  . Alcohol Use: No  . Drug Use: No  . Sexual Activity: Not on file   Other Topics Concern  . Not on file   Social History Narrative  . No narrative on file    Current Outpatient Prescriptions on File Prior to Visit  Medication Sig Dispense Refill  . allopurinol (ZYLOPRIM) 100 MG tablet TAKE ONE & ONE-HALF TABLETS BY MOUTH EVERY DAY  45 tablet  5  . atorvastatin (LIPITOR) 20 MG tablet Take 1 tablet (20 mg total) by mouth daily.  30 tablet  3  . carvedilol (COREG) 6.25 MG tablet Take 6.25 mg by mouth 2 (two) times daily with a meal.      . colchicine (COLCRYS) 0.6 MG tablet Take 1 tablet (0.6 mg total) by mouth daily.  30 tablet  5  . lisinopril (PRINIVIL,ZESTRIL) 20 MG tablet TAKE ONE TABLET BY MOUTH ONCE DAILY  30 tablet  5  . Omega-3 Fatty Acids (FISH OIL) 1200 MG CAPS Take by mouth daily.        Marland Kitchen torsemide (DEMADEX) 20 MG tablet TAKE ONE TABLET BY MOUTH ONCE DAILY  30 tablet  5  . diclofenac sodium (VOLTAREN) 1 % GEL Apply 2 g topically 4 (four) times daily.  100 g  3  . fluticasone (FLONASE) 50 MCG/ACT nasal spray Place 2 sprays into the nose as directed.        Marland Kitchen JANUVIA 50 MG tablet TAKE ONE TABLET BY MOUTH ONCE DAILY  30 tablet  5  . predniSONE (DELTASONE) 10 MG tablet 3 tabs x3 days and then 2 tabs x3 days and then 1 tab x3 days.  Take w/ food.  18 tablet  0  . sildenafil (VIAGRA) 100 MG tablet Take  100 mg by mouth as needed.        . [DISCONTINUED] atenolol (TENORMIN) 50 MG tablet Take 75 mg by mouth daily.        . [DISCONTINUED] Insulin Glargine (LANTUS SOLOSTAR Berry) Inject into the skin as directed.         No current facility-administered medications on file prior to visit.    Allergies  Allergen Reactions  . Sulfonamide Derivatives     REACTION: itching    No family history on file. No thyroid problems BP 102/68  Pulse 85  Temp(Src) 98.3 F (36.8 C)  Resp 12  Ht 5' 7.75" (1.721 m)  Wt 178 lb 4.8 oz (80.876 kg)  BMI 27.31 kg/m2  SpO2 98%    Review of Systems denies fever, headache, double vision, palpitations, sob, diarrhea, polyuria, excessive diaphoresis, numbness, seizure, hypoglycemia, easy bruising, and rhinorrhea.  He has decreased appetite, arthralgias, anxiety, numbness of the feet, and hoarseness.      Objective:   Physical Exam VS: see vs page  GEN: no distress HEAD: head: no deformity eyes: no periorbital swelling, no proptosis external nose and ears are normal mouth: no lesion seen NECK: supple, thyroid is not enlarged CHEST WALL: no deformity LUNGS: clear to auscultation BREASTS:  No gynecomastia CV: reg rate and rhythm, no murmur ABD: abdomen is soft, nontender.  no hepatosplenomegaly.  not distended.  no hernia MUSCULOSKELETAL: muscle bulk and strength are slightly and diffusely  decreased.  no obvious joint swelling.  gait is slow but steady EXTEMITIES: no deformity.  no edema PULSES: no carotid bruit NEURO:  cn 2-12 grossly intact.   readily moves all 4's.  sensation is intact to touch on all 4's.  Slight tremor of the hands. SKIN:  Normal texture and temperature.  No rash or suspicious lesion is visible.   NODES:  None palpable at the neck PSYCH: alert, oriented x3.  Does not appear anxious nor depressed.  Lab Results  Component Value Date   TSH 0.02* 04/19/2013   T3TOTAL 2.2 08/17/2009   T4TOTAL 6.7 08/17/2009      Assessment & Plan:   Hyperthyroidism, new, uncertain etiology.  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 Weight loss: this is most likely thyroid-related.  In this context, he should have prompt normalization of TFT. CHF: this is also a reason to normalize TFT soon.

## 2013-05-05 NOTE — Patient Instructions (Addendum)
i have sent a prescription to your pharmacy, to slow down the thyroid. if ever you have fever while taking methimazole, stop it and call us, because of the risk of a rare side-effect Please come back for a follow-up appointment in 2-3 weeks.   We can reconsider the radioactive iodine later.      Hyperthyroidism The thyroid is a large gland located in the lower front part of your neck. The thyroid helps control metabolism. Metabolism is how your body uses food. It controls metabolism with the hormone thyroxine. When the thyroid is overactive, it produces too much hormone. When this happens, these following problems may occur:   Nervousness  Heat intolerance  Weight loss (in spite of increase food intake)  Diarrhea  Change in hair or skin texture  Palpitations (heart skipping or having extra beats)  Tachycardia (rapid heart rate)  Loss of menstruation (amenorrhea)  Shaking of the hands CAUSES  Grave's Disease (the immune system attacks the thyroid gland). This is the most common cause.  Inflammation of the thyroid gland.  Tumor (usually benign) in the thyroid gland or elsewhere.  Excessive use of thyroid medications (both prescription and 'natural').  Excessive ingestion of Iodine. DIAGNOSIS  To prove hyperthyroidism, your caregiver may do blood tests and ultrasound tests. Sometimes the signs are hidden. It may be necessary for your caregiver to watch this illness with blood tests, either before or after diagnosis and treatment. TREATMENT Short-term treatment There are several treatments to control symptoms. Drugs called beta blockers may give some relief. Drugs that decrease hormone production will provide temporary relief in many people. These measures will usually not give permanent relief. Definitive therapy There are treatments available which can be discussed between you and your caregiver which will permanently treat the problem. These treatments range from surgery  (removal of the thyroid), to the use of radioactive iodine (destroys the thyroid by radiation), to the use of antithyroid drugs (interfere with hormone synthesis). The first two treatments are permanent and usually successful. They most often require hormone replacement therapy for life. This is because it is impossible to remove or destroy the exact amount of thyroid required to make a person euthyroid (normal). HOME CARE INSTRUCTIONS  See your caregiver if the problems you are being treated for get worse. Examples of this would be the problems listed above. SEEK MEDICAL CARE IF: Your general condition worsens. MAKE SURE YOU:   Understand these instructions.  Will watch your condition.  Will get help right away if you are not doing well or get worse. Document Released: 06/03/2005 Document Revised: 08/26/2011 Document Reviewed: 10/15/2006 [facility] Patient Information 2014 [place], [place].

## 2013-05-06 DIAGNOSIS — E0590 Thyrotoxicosis, unspecified without thyrotoxic crisis or storm: Secondary | ICD-10-CM | POA: Insufficient documentation

## 2013-05-06 MED ORDER — PROPYLTHIOURACIL 50 MG PO TABS: ORAL_TABLET | ORAL | Status: DC

## 2013-05-06 NOTE — Telephone Encounter (Signed)
If that patient is referring to the Januvia, Januvia doesn't come in generic yet.  Please advise

## 2013-05-06 NOTE — Addendum Note (Signed)
Addended by: Renato Shin on: 05/06/2013 06:24 PM   Modules accepted: Orders

## 2013-05-06 NOTE — Telephone Encounter (Signed)
i did not rx the [place], as i only saw you for the thyroid.

## 2013-05-06 NOTE — Telephone Encounter (Signed)
Forwarding

## 2013-05-06 NOTE — Telephone Encounter (Signed)
There is only 1 alternative, so i have sent a prescription to your pharmacy, for this.

## 2013-05-06 NOTE — Telephone Encounter (Signed)
Patient never said it was the Januvia, it was the methimazole you prescribed for him that was $45., they want to know if there is a cheaper rx instead of that one.  Please advise

## 2013-05-25 ENCOUNTER — Ambulatory Visit (INDEPENDENT_AMBULATORY_CARE_PROVIDER_SITE_OTHER): Payer: PRIVATE HEALTH INSURANCE | Admitting: Gastroenterology

## 2013-05-25 ENCOUNTER — Encounter: Payer: Self-pay | Admitting: Gastroenterology

## 2013-05-25 VITALS — BP 100/58 | HR 100 | Ht 67.75 in | Wt 178.0 lb

## 2013-05-25 DIAGNOSIS — R634 Abnormal weight loss: Secondary | ICD-10-CM

## 2013-05-25 DIAGNOSIS — R109 Unspecified abdominal pain: Secondary | ICD-10-CM

## 2013-05-25 MED ORDER — OMEPRAZOLE 40 MG PO CPDR: 40.0000 mg | DELAYED_RELEASE_CAPSULE | Freq: Every day | ORAL | Status: DC

## 2013-05-25 NOTE — Progress Notes (Addendum)
    History of Present Illness: This is a 72 year old male with weight loss, abdominal pain, a normocytic anemia, a cardiomyopathy, ejection fraction 20-25%, and ICD placed. He is accompanied by his son. He states his appetite loss and weight loss has occurred over the past 2 months and he is lost about 25 or 30 pounds. His recently diagnosed with hyperthyroidism and was started on PTU. He's been followed by Dr. Loanne Drilling who feels his appetite and weight changes are likely secondary hyperthyroidism. Recent stool Hemoccults were negative. He states he underwent colonoscopy around 2007 and 2008 18 in [place], [place]. He notes intermittent mild upper abdominal pain this has actually improved over the past several weeks. He notes a slightly looser stool since beginning PTU. Denies constipation, diarrhea, change in stool caliber, melena, hematochezia, nausea, vomiting, dysphagia, reflux symptoms, chest pain.  Review of Systems: Pertinent positive and negative review of systems were noted in the above HPI section. All other review of systems were otherwise negative.  Current Medications, Allergies, Past Medical History, Past Surgical History, Family History and Social History were reviewed in [employer] record.  Physical Exam: General: Well developed , well nourished, chronically ill-appearing, no acute distress Head: Normocephalic and atraumatic Eyes:  sclerae anicteric, EOMI Ears: Normal auditory acuity Mouth: No deformity or lesions Neck: Supple, no masses or thyromegaly Lungs: Clear throughout to auscultation Heart: Regular rate and rhythm; no murmurs, rubs or bruits Abdomen: Soft, non tender and non distended. No masses, hepatosplenomegaly or hernias noted. Normal Bowel sounds Musculoskeletal: Symmetrical with no gross deformities  Skin: No lesions on visible extremities Pulses:  Normal pulses noted Extremities: No clubbing, cyanosis, edema or deformities noted Neurological:  Alert oriented x 4, grossly nonfocal Cervical Nodes:  No significant cervical adenopathy Inguinal Nodes: No significant inguinal adenopathy Psychological:  Alert and cooperative. Normal mood and affect  Assessment and Recommendations:  1. Weight loss and loss of appetite. Symptoms are likely secondary to hyperthyroidism. Upper abdominal pain may be related to GERD or ulcer. Begin omeprazole 40 mg daily. Consider upper endoscopy if symptoms not improved. He is at higher risk for sedation and endoscopic procedures due to his cardiomyopathy. Will attempt to obtain records from his prior colonoscopy. Return office visit in 6-8 weeks allowing for followup with Dr. Loanne Drilling and ongoing treatment of hyperthyroidism.  2. Normocytic anemia and Hemoccult negative stool. He is at higher risk for sedation endoscopic procedures due to his cardiomyopathy is desirable to avoid colonoscopy if possible. Reassess at return office visit in 6-8 weeks.    06/01/13 records from [place], [place] received and reviewed. Colonoscopy report not received but PCP records indicate he had a colonoscopy in 2008 with one polyp removed. Will attempt again to get colonoscopy report and path report.

## 2013-05-25 NOTE — Patient Instructions (Addendum)
We have sent the following medications to your pharmacy for you to pick up at your convenience: omeprazole.  Please schedule a follow up with Dr. Fuller Plan for 6-8 weeks.   Thank you for choosing me and [facility].  Pricilla Riffle. Dagoberto Ligas., MD., Marval Regal

## 2013-06-01 ENCOUNTER — Ambulatory Visit: Payer: PRIVATE HEALTH INSURANCE | Admitting: Endocrinology

## 2013-06-04 ENCOUNTER — Ambulatory Visit: Payer: PRIVATE HEALTH INSURANCE | Admitting: Endocrinology

## 2013-06-21 ENCOUNTER — Ambulatory Visit: Payer: PRIVATE HEALTH INSURANCE | Admitting: Endocrinology

## 2013-06-21 DIAGNOSIS — Z0289 Encounter for other administrative examinations: Secondary | ICD-10-CM

## 2013-06-24 ENCOUNTER — Encounter: Payer: PRIVATE HEALTH INSURANCE | Admitting: *Deleted

## 2013-06-30 ENCOUNTER — Encounter: Payer: Self-pay | Admitting: *Deleted

## 2013-07-01 ENCOUNTER — Encounter: Payer: Self-pay | Admitting: Internal Medicine

## 2013-07-01 ENCOUNTER — Encounter: Payer: Medicare HMO | Admitting: *Deleted

## 2013-07-01 DIAGNOSIS — I429 Cardiomyopathy, unspecified: Secondary | ICD-10-CM

## 2013-07-01 DIAGNOSIS — Z95810 Presence of automatic (implantable) cardiac defibrillator: Secondary | ICD-10-CM

## 2013-07-07 ENCOUNTER — Ambulatory Visit (INDEPENDENT_AMBULATORY_CARE_PROVIDER_SITE_OTHER): Payer: Medicare HMO | Admitting: Family Medicine

## 2013-07-07 ENCOUNTER — Encounter: Payer: Self-pay | Admitting: Family Medicine

## 2013-07-07 VITALS — BP 136/84 | HR 101 | Temp 98.2°F | Resp 16 | Wt 176.0 lb

## 2013-07-07 DIAGNOSIS — IMO0001 Reserved for inherently not codable concepts without codable children: Secondary | ICD-10-CM

## 2013-07-07 DIAGNOSIS — M791 Myalgia, unspecified site: Secondary | ICD-10-CM

## 2013-07-07 DIAGNOSIS — E0590 Thyrotoxicosis, unspecified without thyrotoxic crisis or storm: Secondary | ICD-10-CM

## 2013-07-07 DIAGNOSIS — N259 Disorder resulting from impaired renal tubular function, unspecified: Secondary | ICD-10-CM

## 2013-07-07 DIAGNOSIS — M109 Gout, unspecified: Secondary | ICD-10-CM

## 2013-07-07 LAB — IBC PANEL
IRON: 64 ug/dL (ref 42–165)
SATURATION RATIOS: 26.2 % (ref 20.0–50.0)
Transferrin: 174.6 mg/dL — ABNORMAL LOW (ref 212.0–360.0)

## 2013-07-07 LAB — BASIC METABOLIC PANEL
BUN: 61 mg/dL — ABNORMAL HIGH (ref 6–23)
CO2: 19 meq/L (ref 19–32)
Calcium: 8.5 mg/dL (ref 8.4–10.5)
Chloride: 110 mEq/L (ref 96–112)
Creatinine, Ser: 2.9 mg/dL — ABNORMAL HIGH (ref 0.4–1.5)
GFR: 23.37 mL/min — AB (ref >60.00)
Glucose, Bld: 106 mg/dL — ABNORMAL HIGH (ref 70–99)
POTASSIUM: 5.4 meq/L — AB (ref 3.5–5.1)
SODIUM: 134 meq/L — AB (ref 135–145)

## 2013-07-07 LAB — CK: CK TOTAL: 157 U/L (ref 7–232)

## 2013-07-07 LAB — CBC WITH DIFFERENTIAL/PLATELET
BASOS ABS: 0 10*3/uL (ref 0.0–0.1)
BASOS PCT: 0.7 % (ref 0.0–3.0)
Eosinophils Absolute: 0.3 10*3/uL (ref 0.0–0.7)
Eosinophils Relative: 5.7 % — ABNORMAL HIGH (ref 0.0–5.0)
HEMATOCRIT: 28.4 % — AB (ref 39.0–52.0)
HEMOGLOBIN: 9.1 g/dL — AB (ref 13.0–17.0)
Lymphocytes Relative: 22 % (ref 12.0–46.0)
Lymphs Abs: 1.1 10*3/uL (ref 0.7–4.0)
MCHC: 32.2 g/dL (ref 30.0–36.0)
MCV: 79.9 fl (ref 78.0–100.0)
MONOS PCT: 6.7 % (ref 3.0–12.0)
Monocytes Absolute: 0.3 10*3/uL (ref 0.1–1.0)
NEUTROS ABS: 3.2 10*3/uL (ref 1.4–7.7)
Neutrophils Relative %: 64.9 % (ref 43.0–77.0)
Platelets: 150 10*3/uL (ref 150.0–400.0)
RBC: 3.55 Mil/uL — ABNORMAL LOW (ref 4.22–5.81)
RDW: 15.3 % — ABNORMAL HIGH (ref 11.5–14.6)
WBC: 5 10*3/uL (ref 4.5–10.5)

## 2013-07-07 LAB — URIC ACID: URIC ACID, SERUM: 7.5 mg/dL (ref 4.0–7.8)

## 2013-07-07 MED ORDER — DICLOFENAC SODIUM 1 % TD GEL: 4.0000 g | Freq: Four times a day (QID) | TRANSDERMAL | Status: DC

## 2013-07-07 NOTE — Progress Notes (Signed)
   Subjective:    Patient ID: Tyler Hahn, male    DOB: 05-09-1941, 73 y.o.   MRN: [id number]  HPI Renal insufficiency- pt saw Dr Hassell Done earlier today, pt has list of labs that need to be done.  Gout- pt reports he is having flare in L knee.  [place] going up the stairs last week- 'i'm still sore but it's better in the last 2 days'.  On Allopurinol and colchicine.  Was told not to take NSAIDs due to renal dysfxn.  Myalgias- pt have muscle and joint aches.  Aware that this is sxs of Graves Disease.  Due for f/u w/ Dr Loanne Drilling.     Review of Systems For ROS see HPI     Objective:   Physical Exam  Vitals reviewed. Constitutional: He is oriented to person, place, and time. He appears well-developed and well-nourished. No distress.  HENT:  Head: Normocephalic and atraumatic.  Eyes: Conjunctivae and EOM are normal. Pupils are equal, round, and reactive to light.  Neck: Normal range of motion. Neck supple. No thyromegaly present.  Cardiovascular: Normal rate, regular rhythm, normal heart sounds and intact distal pulses.   No murmur heard. Pulmonary/Chest: Effort normal and breath sounds normal. No respiratory distress.  Abdominal: Soft. Bowel sounds are normal. He exhibits no distension.  Musculoskeletal: He exhibits tenderness (L medial knee TTP and warm to touch). He exhibits no edema.  Lymphadenopathy:    He has no cervical adenopathy.  Neurological: He is alert and oriented to person, place, and time. No cranial nerve deficit.  Skin: Skin is warm and dry.  Psychiatric: He has a normal mood and affect. His behavior is normal.          Assessment & Plan:

## 2013-07-07 NOTE — Assessment & Plan Note (Signed)
Pt has not followed up w/ Endo as directed.  Strongly encouraged him to do so in order to better manage his sxs.

## 2013-07-07 NOTE — Progress Notes (Signed)
Pre visit review using our clinic review tool, if applicable. No additional management support is needed unless otherwise documented below in the visit note. 

## 2013-07-07 NOTE — Assessment & Plan Note (Signed)
Chronic problem.  Saw Dr Hassell Done earlier today- has list of labs to be done.

## 2013-07-07 NOTE — Patient Instructions (Signed)
Please call and schedule an appt w/ Dr Primus Bravo notify you of your lab results and make any changes if needed Once we see the Uric Acid level we'll determine if you need prednisone for gout Ice the knee for pain relief Use the Voltaren gel for pain Call with any questions or concerns Hang in there!

## 2013-07-07 NOTE — Assessment & Plan Note (Signed)
New.  Could be consequence of Grave's disease or renal insufficiency.  Check CK to r/o muscle breakdown.  Encouraged him to f/u w/ endo as directed- pt is overdue.

## 2013-07-07 NOTE — Assessment & Plan Note (Signed)
Chronic problem.  On Allopurinol and Colchicine.  Check uric acid level.  Start voltaren gel to avoid renal toxicity.  May need prednisone if sxs don't improve.

## 2013-07-08 ENCOUNTER — Other Ambulatory Visit: Payer: Self-pay | Admitting: Family Medicine

## 2013-07-08 ENCOUNTER — Encounter: Payer: Self-pay | Admitting: General Practice

## 2013-07-08 DIAGNOSIS — E875 Hyperkalemia: Secondary | ICD-10-CM

## 2013-07-08 LAB — VITAMIN D 25 HYDROXY (VIT D DEFICIENCY, FRACTURES): Vit D, 25-Hydroxy: 10 ng/mL — ABNORMAL LOW (ref 30–89)

## 2013-07-08 MED ORDER — VITAMIN D (ERGOCALCIFEROL) 1.25 MG (50000 UNIT) PO CAPS: 50000.0000 [IU] | ORAL_CAPSULE | ORAL | Status: DC

## 2013-07-09 ENCOUNTER — Other Ambulatory Visit: Payer: Self-pay | Admitting: Family Medicine

## 2013-07-09 LAB — PTH, INTACT AND CALCIUM
Calcium: 8.4 mg/dL (ref 8.4–10.5)
PTH: 453 pg/mL — ABNORMAL HIGH (ref 14.0–72.0)

## 2013-07-09 NOTE — Telephone Encounter (Signed)
Med filled.  

## 2013-07-13 ENCOUNTER — Other Ambulatory Visit (INDEPENDENT_AMBULATORY_CARE_PROVIDER_SITE_OTHER): Payer: Medicare HMO

## 2013-07-13 DIAGNOSIS — E875 Hyperkalemia: Secondary | ICD-10-CM

## 2013-07-13 LAB — BASIC METABOLIC PANEL
BUN: 79 mg/dL — AB (ref 6–23)
CALCIUM: 9 mg/dL (ref 8.4–10.5)
CHLORIDE: 112 meq/L (ref 96–112)
CO2: 18 mEq/L — ABNORMAL LOW (ref 19–32)
CREATININE: 3.3 mg/dL — AB (ref 0.4–1.5)
GFR: 20.08 mL/min — AB (ref >60.00)
Glucose, Bld: 142 mg/dL — ABNORMAL HIGH (ref 70–99)
Potassium: 6 mEq/L — ABNORMAL HIGH (ref 3.5–5.1)
Sodium: 135 mEq/L (ref 135–145)

## 2013-07-14 ENCOUNTER — Ambulatory Visit (INDEPENDENT_AMBULATORY_CARE_PROVIDER_SITE_OTHER): Payer: Medicare HMO | Admitting: Gastroenterology

## 2013-07-14 ENCOUNTER — Encounter: Payer: Self-pay | Admitting: Gastroenterology

## 2013-07-14 VITALS — BP 120/62 | HR 60 | Ht 67.75 in | Wt 172.4 lb

## 2013-07-14 DIAGNOSIS — R1013 Epigastric pain: Secondary | ICD-10-CM

## 2013-07-14 DIAGNOSIS — R634 Abnormal weight loss: Secondary | ICD-10-CM

## 2013-07-14 DIAGNOSIS — D649 Anemia, unspecified: Secondary | ICD-10-CM

## 2013-07-14 NOTE — Patient Instructions (Signed)
You have been scheduled for a CT scan of the abdomen and pelvis at [facility] ([address]---this is in the same building as [employer]).   You are scheduled on 07/16/13 at 3:30pm. You should arrive 15 minutes prior to your appointment time for registration. Please follow the written instructions below on the day of your exam:  1) You have been given 2 bottles of oral contrast to drink. The solution may taste better if refrigerated, but do NOT add ice or any other liquid to this solution. Shake well before drinking.    Drink 1 bottle of contrast @ 1:30pm (2 hours prior to your exam)  Drink 1 bottle of contrast @ 2:30pm (1 hour prior to your exam)  You may take any medications as prescribed with a small amount of water except for the following: Metformin, Glucophage, Glucovance, Avandamet, Riomet, Fortamet, Actoplus Met, Janumet, Glumetza or Metaglip. The above medications must be held the day of the exam AND 48 hours after the exam.  The purpose of you drinking the oral contrast is to aid in the visualization of your intestinal tract. The contrast solution may cause some diarrhea. Before your exam is started, you will be given a small amount of fluid to drink. Depending on your individual set of symptoms, you may also receive an intravenous injection of x-ray contrast/dye. Plan on being at [facility] for 30 minutes or long, depending on the type of exam you are having performed.  This test typically takes 30-45 minutes to complete.  If you have any questions regarding your exam or if you need to reschedule, you may call the CT department at [phone] between the hours of 8:00 am and 5:00 pm, Monday-Friday.  ________________________________________________________________________  Thank you for choosing me and [facility].  Pricilla Riffle. Dagoberto Ligas., MD., Mar[facility] Regal

## 2013-07-14 NOTE — Progress Notes (Signed)
    History of Present Illness: This is a 73 year old male who returns for followup of anorexia and weight loss. His symptoms persist. He has had a progressive anemia with his most recent hemoglobin 9.1. Hemoglobin one year ago was 10.9. His weight has decreased about 6 pounds since his last office visit with me. Stool testing for occult blood has been negative. Iron studies are consistent with a chronic disease pattern. His symptoms have not improved on omeprazole. He is still being treated for hyperthyroidism and is being followed for chronic kidney disease by a nephrologist. His colonoscopy report was received and reviewed. Procedure performed 08/28/2004 showing 2 small polyps which were hyperplastic.  Current Medications, Allergies, Past Medical History, Past Surgical History, Family History and Social History were reviewed in [employer] record.  Physical Exam: General: Well developed , well nourished, no acute distress Head: Normocephalic and atraumatic Eyes:  sclerae anicteric, EOMI Ears: Normal auditory acuity Mouth: No deformity or lesions Lungs: Clear throughout to auscultation Heart: Regular rate and rhythm; no murmurs, rubs or bruits Abdomen: Soft, mild epigastric tenderness without rebound or guarding and non distended. No masses, hepatosplenomegaly or hernias noted. Normal Bowel sounds Musculoskeletal: Symmetrical with no gross deformities  Pulses:  Normal pulses noted Extremities: No clubbing, cyanosis, left knee is edematous with decreased mobility Neurological: Alert oriented x 4, grossly nonfocal Psychological:  Alert and cooperative. Normal mood and affect  Assessment and Recommendations:  1. Weight loss, anorexia, epigastric tenderness. Continue omeprazole 40 mg daily. Schedule abdominal pelvic CT scan without IV contrast given CKD. If this is not diagnostic, proceed with upper endoscopy.  2. 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 No  evidence of iron deficiency. Stool testing is negative or occult blood. Further evaluation per PCP and nephrology.  3. Cardiomyopathy with ICD.   4. Hyperthyroidism under treatment. His symptoms may be related to hyperthyroidism.  5. CKD followed by nephrology.  6. Left knee gout.

## 2013-07-16 ENCOUNTER — Other Ambulatory Visit: Payer: Self-pay | Admitting: Family Medicine

## 2013-07-16 ENCOUNTER — Ambulatory Visit (INDEPENDENT_AMBULATORY_CARE_PROVIDER_SITE_OTHER)
Admission: RE | Admit: 2013-07-16 | Discharge: 2013-07-16 | Disposition: A | Discharge status: Home / Self Care | Payer: Medicare HMO | Source: Ambulatory Visit | Attending: Gastroenterology | Admitting: Gastroenterology

## 2013-07-16 DIAGNOSIS — R1013 Epigastric pain: Secondary | ICD-10-CM

## 2013-07-16 DIAGNOSIS — E875 Hyperkalemia: Secondary | ICD-10-CM

## 2013-07-16 DIAGNOSIS — R634 Abnormal weight loss: Secondary | ICD-10-CM

## 2013-07-19 ENCOUNTER — Other Ambulatory Visit (INDEPENDENT_AMBULATORY_CARE_PROVIDER_SITE_OTHER): Payer: Medicare HMO

## 2013-07-19 ENCOUNTER — Telehealth: Payer: Self-pay | Admitting: Gastroenterology

## 2013-07-19 DIAGNOSIS — E875 Hyperkalemia: Secondary | ICD-10-CM

## 2013-07-19 LAB — BASIC METABOLIC PANEL
BUN: 79 mg/dL — ABNORMAL HIGH (ref 6–23)
CALCIUM: 8.7 mg/dL (ref 8.4–10.5)
CO2: 17 mEq/L — ABNORMAL LOW (ref 19–32)
Chloride: 111 mEq/L (ref 96–112)
Creatinine, Ser: 3.1 mg/dL — ABNORMAL HIGH (ref 0.4–1.5)
GFR: 20.97 mL/min — AB (ref >60.00)
Glucose, Bld: 138 mg/dL — ABNORMAL HIGH (ref 70–99)
POTASSIUM: 5.2 meq/L — AB (ref 3.5–5.1)
SODIUM: 137 meq/L (ref 135–145)

## 2013-07-19 NOTE — Telephone Encounter (Signed)
Spoke with pt and he is aware of results and knows he will be getting a call back to schedule and EGD/Entero at [facility].

## 2013-07-19 NOTE — Telephone Encounter (Signed)
Pt had not heard back on results. Says he doesn't understand the wording

## 2013-07-20 ENCOUNTER — Other Ambulatory Visit: Payer: Self-pay

## 2013-07-20 DIAGNOSIS — R933 Abnormal findings on diagnostic imaging of other parts of digestive tract: Secondary | ICD-10-CM

## 2013-07-20 NOTE — Telephone Encounter (Signed)
Patient is scheduled for enteroscopy at [facility] 08/09/13 8:30.  Son aware.  I have mailed instructions to the patient.

## 2013-07-20 NOTE — Telephone Encounter (Signed)
Left message for patient to call back  

## 2013-07-21 ENCOUNTER — Telehealth: Payer: Self-pay | Admitting: *Deleted

## 2013-07-21 ENCOUNTER — Encounter: Payer: Self-pay | Admitting: *Deleted

## 2013-07-21 NOTE — Telephone Encounter (Signed)
Patient's son, Coel Lintz, called and stated that he would like to speak with a nurse concerning his fathers current health status. He can be reached at [phone]

## 2013-07-26 NOTE — Telephone Encounter (Signed)
Paperwork filled out for [employer] on 07/26/13 and faxed same day.

## 2013-07-26 NOTE — Telephone Encounter (Signed)
Son has some concerns about patient's ability to care for himself at home.  He wants to know if his father can get some help with cooking, cleaning, medication administration, etc.    Last OV:  04/19/13  Please advise.

## 2013-07-26 NOTE — Telephone Encounter (Signed)
Pt will need Home Health assessment to determine if he is able to remain home alone.  HH will be able to help w/ med administration, safety assessment, etc but unfortunately cooking and cleaning are through personal care services and most families have to seek these services out on their own

## 2013-07-27 NOTE — Telephone Encounter (Signed)
Pt son notified.

## 2013-07-28 NOTE — Telephone Encounter (Signed)
Kath (RN) from [employer] called to see if dr Birdie Riddle would approve PT elevation for Tyler Hahn. He is currently having pain when he walks and even to were he looks like he is going to fall. Also Kath needs recent A1C results faxed over.    Fax#[phone] CB#[phone]

## 2013-07-28 NOTE — Telephone Encounter (Signed)
Verbal ok given for pt evaluation and labs were faxed.

## 2013-08-06 ENCOUNTER — Telehealth: Payer: Self-pay | Admitting: Gastroenterology

## 2013-08-06 NOTE — Telephone Encounter (Signed)
Dr. Delma Officer- Patient wants to cancel his procedure for Monday 08/09/13.  "Im just not ready to do anything".  I have left a message with [facility] endo they are gone for the day.

## 2013-08-06 NOTE — Telephone Encounter (Signed)
Please contact him again to encourage him to reschedule enteroscopy very soon. Anbl SB noted on CT, along with his symptoms, needs further evaluation. I have limited times available to reschedule this hospital procedure.

## 2013-08-09 ENCOUNTER — Ambulatory Visit (HOSPITAL_COMMUNITY): Admission: RE | Admit: 2013-08-09 | Payer: Medicare HMO | Source: Ambulatory Visit | Admitting: Gastroenterology

## 2013-08-09 ENCOUNTER — Encounter (HOSPITAL_COMMUNITY): Admission: RE | Payer: Self-pay | Source: Ambulatory Visit

## 2013-08-09 SURGERY — ENTEROSCOPY
Anesthesia: Moderate Sedation

## 2013-08-09 SURGERY — Surgical Case
Anesthesia: *Unknown

## 2013-08-09 NOTE — Telephone Encounter (Signed)
I spoke with the patient and advised him of recommendations.  He refuses to schedule.  "Ill do it later".  I have encouraged him to try and reschedule for this week while Dr. Fuller Plan is the hospital MD.  He again adamantly refused.

## 2013-08-19 ENCOUNTER — Other Ambulatory Visit: Payer: Self-pay | Admitting: General Practice

## 2013-08-19 MED ORDER — VITAMIN D (ERGOCALCIFEROL) 1.25 MG (50000 UNIT) PO CAPS: 50000.0000 [IU] | ORAL_CAPSULE | ORAL | Status: DC

## 2013-08-19 MED ORDER — TORSEMIDE 20 MG PO TABS: ORAL_TABLET | ORAL | Status: DC

## 2013-09-22 ENCOUNTER — Telehealth: Payer: Self-pay | Admitting: *Deleted

## 2013-09-22 DIAGNOSIS — IMO0001 Reserved for inherently not codable concepts without codable children: Secondary | ICD-10-CM

## 2013-09-22 DIAGNOSIS — M109 Gout, unspecified: Secondary | ICD-10-CM

## 2013-09-22 DIAGNOSIS — G608 Other hereditary and idiopathic neuropathies: Secondary | ICD-10-CM

## 2013-09-22 DIAGNOSIS — I5020 Unspecified systolic (congestive) heart failure: Secondary | ICD-10-CM

## 2013-09-22 DIAGNOSIS — E0590 Thyrotoxicosis, unspecified without thyrotoxic crisis or storm: Secondary | ICD-10-CM

## 2013-09-22 NOTE — Telephone Encounter (Signed)
Received home health certification and plan of care via fax from [facility]. Billing sheet attached and placed in folder for Dr. Birdie Riddle. JG//CMA

## 2013-09-30 ENCOUNTER — Encounter: Payer: Self-pay | Admitting: *Deleted

## 2013-10-27 ENCOUNTER — Other Ambulatory Visit: Payer: Self-pay | Admitting: Endocrinology

## 2013-10-28 ENCOUNTER — Other Ambulatory Visit: Payer: Self-pay | Admitting: *Deleted

## 2013-10-28 MED ORDER — METHIMAZOLE 10 MG PO TABS: 20.0000 mg | ORAL_TABLET | Freq: Two times a day (BID) | ORAL | Status: DC

## 2013-11-15 ENCOUNTER — Ambulatory Visit (INDEPENDENT_AMBULATORY_CARE_PROVIDER_SITE_OTHER): Payer: Medicare HMO | Admitting: Gastroenterology

## 2013-11-15 ENCOUNTER — Encounter: Payer: Self-pay | Admitting: Gastroenterology

## 2013-11-15 ENCOUNTER — Other Ambulatory Visit (INDEPENDENT_AMBULATORY_CARE_PROVIDER_SITE_OTHER): Payer: Medicare HMO

## 2013-11-15 VITALS — BP 110/60 | HR 64 | Ht 67.75 in | Wt 161.8 lb

## 2013-11-15 DIAGNOSIS — R634 Abnormal weight loss: Secondary | ICD-10-CM

## 2013-11-15 DIAGNOSIS — R1012 Left upper quadrant pain: Secondary | ICD-10-CM

## 2013-11-15 DIAGNOSIS — R933 Abnormal findings on diagnostic imaging of other parts of digestive tract: Secondary | ICD-10-CM

## 2013-11-15 LAB — COMPREHENSIVE METABOLIC PANEL
ALK PHOS: 85 U/L (ref 39–117)
ALT: 20 U/L (ref 0–53)
AST: 20 U/L (ref 0–37)
Albumin: 4 g/dL (ref 3.5–5.2)
BUN: 94 mg/dL (ref 6–23)
CO2: 20 mEq/L (ref 19–32)
CREATININE: 3.1 mg/dL — AB (ref 0.4–1.5)
Calcium: 9.4 mg/dL (ref 8.4–10.5)
Chloride: 111 mEq/L (ref 96–112)
GFR: 25.54 mL/min — ABNORMAL LOW (ref >60.00)
Glucose, Bld: 116 mg/dL — ABNORMAL HIGH (ref 70–99)
Potassium: 5.3 mEq/L — ABNORMAL HIGH (ref 3.5–5.1)
Sodium: 139 mEq/L (ref 135–145)
Total Bilirubin: 0.6 mg/dL (ref 0.2–1.2)
Total Protein: 7.7 g/dL (ref 6.0–8.3)

## 2013-11-15 LAB — CBC WITH DIFFERENTIAL/PLATELET
BASOS ABS: 0 10*3/uL (ref 0.0–0.1)
BASOS PCT: 0.7 % (ref 0.0–3.0)
EOS ABS: 0.3 10*3/uL (ref 0.0–0.7)
Eosinophils Relative: 4.9 % (ref 0.0–5.0)
HCT: 28.1 % — ABNORMAL LOW (ref 39.0–52.0)
Hemoglobin: 9.1 g/dL — ABNORMAL LOW (ref 13.0–17.0)
LYMPHS PCT: 21 % (ref 12.0–46.0)
Lymphs Abs: 1.1 10*3/uL (ref 0.7–4.0)
MCHC: 32.2 g/dL (ref 30.0–36.0)
MCV: 85.3 fl (ref 78.0–100.0)
MONO ABS: 0.4 10*3/uL (ref 0.1–1.0)
Monocytes Relative: 7.3 % (ref 3.0–12.0)
NEUTROS PCT: 66.1 % (ref 43.0–77.0)
Neutro Abs: 3.5 10*3/uL (ref 1.4–7.7)
Platelets: 137 10*3/uL — ABNORMAL LOW (ref 150.0–400.0)
RBC: 3.3 Mil/uL — AB (ref 4.22–5.81)
RDW: 14.3 % (ref 11.5–15.5)
WBC: 5.3 10*3/uL (ref 4.0–10.5)

## 2013-11-15 NOTE — Progress Notes (Signed)
    History of Present Illness: This is a 73 year old male accompanied by his wife and son. He has had a progressive, steady weight loss with anorexia and intermittent mild left upper quadrant pain. His family reports that  he has lost about 40 pounds over the past year. CT scan in late January 2015 showed:  Abnormal loop of proximal jejunum with wall thickening, inflammation, and retraction of the mesenteric. Malignancy involving this loop of bowel is a consideration. Inflammatory change from infectious or inflammatory enteritis, as well as the possibility of ischemic stricture, could all have this appearance as well.  He was scheduled for enteroscopy in February but canceled the procedure and has not rescheduled. We strongly advised him to reschedule the enteroscopy as soon as possible however he has not. He returns today to further discuss.    Current Medications, Allergies, Past Medical History, Past Surgical History, Family History and Social History were reviewed in [employer] record.  Physical Exam: General: Well developed , well nourished, no acute distress Head: Normocephalic and atraumatic Eyes:  sclerae anicteric, EOMI Ears: Normal auditory acuity Mouth: No deformity or lesions Lungs: Clear throughout to auscultation Heart: Regular rate and rhythm; no murmurs, rubs or bruits Abdomen: Soft, non tender and non distended. No masses, hepatosplenomegaly or hernias noted. Normal Bowel sounds Musculoskeletal: Symmetrical with no gross deformities  Pulses:  Normal pulses noted Extremities: No clubbing, cyanosis, edema or deformities noted Neurological: Alert oriented x 4, grossly nonfocal Psychological:  Alert and cooperative. Normal mood and affect  Assessment and Recommendations:  1. Abnl CT of jejunum. Weight loss. LUQ pain. We had a long discussion about his symptoms, the CT findings and they recommended evaluation. Ample time was allowed for further  questions and  I attempted to answer all questions from him, his wife and his son.  CBC, CMET today. Schedule CT enteroscopy and push enteroscopy. If the lesion cannot be reached we discussed referral to a tertiary center for deep enteroscopy.

## 2013-11-15 NOTE — Patient Instructions (Addendum)
Your physician has requested that you go to the basement for the following lab work before leaving today:CBC, [place].   You have been scheduled for a CT scan of the abdomen and pelvis enterography at [facility] ([address]---this is in the same building as [employer]).   You are scheduled on 11/19/13 at 11:00am. You should arrive at 10:00am. Please follow the written instructions below on the day of your exam:  WARNING: IF YOU ARE ALLERGIC TO IODINE/X-RAY DYE, PLEASE NOTIFY RADIOLOGY IMMEDIATELY AT [phone]! YOU WILL BE GIVEN A 13 HOUR PREMEDICATION PREP.  1) Do not eat or drink anything after 6:00am (4 hours prior to your test)  You may take any medications as prescribed with a small amount of water except for the following: Metformin, Glucophage, Glucovance, Avandamet, Riomet, Fortamet, Actoplus Met, Janumet, Glumetza or Metaglip. The above medications must be held the day of the exam AND 48 hours after the exam.   If you have any questions regarding your exam or if you need to reschedule, you may call the CT department at [phone] between the hours of 8:00 am and 5:00 pm, Monday-Friday.  ________________________________________________________________________  Dennis Bast have been scheduled for an Enteroscopy with propofol. Please follow written instructions given to you at your visit today. If you use inhalers (even only as needed), please bring them with you on the day of your procedure.  Thank you for choosing me and [facility].  Pricilla Riffle. Dagoberto Ligas., MD., Marval Regal

## 2013-11-18 ENCOUNTER — Telehealth: Payer: Self-pay

## 2013-11-18 NOTE — Telephone Encounter (Signed)
Dr. Fuller Plan patient with Creatinine of 3.1.  CT is calling because he is scheduled for a enteroscopy tomorrow at 59.  They can not not perform enterography without contrast and contrast can't be given unless he is on dialysis.  Do you want to do an alternate exam before I cancel?

## 2013-11-18 NOTE — Telephone Encounter (Signed)
Then cancel CT and just proceed with push enteroscopy. I didn't know that an oral contrast only CT enterography wasn't an option.

## 2013-11-18 NOTE — Telephone Encounter (Signed)
I have explained to the patient that creatinine is too high to proceed with Ct enterography.  He is asked to keep push enteroscopy scheduled for 6/11.  He will call for any concerns

## 2013-11-19 ENCOUNTER — Other Ambulatory Visit: Payer: Medicare HMO

## 2013-11-19 ENCOUNTER — Encounter: Payer: Self-pay | Admitting: Cardiology

## 2013-11-24 ENCOUNTER — Ambulatory Visit (INDEPENDENT_AMBULATORY_CARE_PROVIDER_SITE_OTHER): Payer: Medicare HMO | Admitting: *Deleted

## 2013-11-24 DIAGNOSIS — I429 Cardiomyopathy, unspecified: Secondary | ICD-10-CM

## 2013-11-24 NOTE — Progress Notes (Signed)
Remote ICD transmission.   

## 2013-11-25 ENCOUNTER — Ambulatory Visit (HOSPITAL_COMMUNITY)
Admission: RE | Admit: 2013-11-25 | Discharge: 2013-11-25 | Disposition: A | Discharge status: Home / Self Care | Payer: Medicare HMO | Source: Ambulatory Visit | Attending: Gastroenterology | Admitting: Gastroenterology

## 2013-11-25 ENCOUNTER — Encounter (HOSPITAL_COMMUNITY): Payer: Self-pay | Admitting: *Deleted

## 2013-11-25 ENCOUNTER — Encounter (HOSPITAL_COMMUNITY)
Admission: RE | Disposition: A | Discharge status: Home / Self Care | Payer: Medicare HMO | Source: Ambulatory Visit | Attending: Gastroenterology

## 2013-11-25 ENCOUNTER — Telehealth: Payer: Self-pay

## 2013-11-25 DIAGNOSIS — R1012 Left upper quadrant pain: Secondary | ICD-10-CM | POA: Insufficient documentation

## 2013-11-25 DIAGNOSIS — R933 Abnormal findings on diagnostic imaging of other parts of digestive tract: Secondary | ICD-10-CM

## 2013-11-25 DIAGNOSIS — R630 Anorexia: Secondary | ICD-10-CM | POA: Insufficient documentation

## 2013-11-25 DIAGNOSIS — R948 Abnormal results of function studies of other organs and systems: Secondary | ICD-10-CM | POA: Insufficient documentation

## 2013-11-25 DIAGNOSIS — R634 Abnormal weight loss: Secondary | ICD-10-CM

## 2013-11-25 HISTORY — DX: Heart failure, unspecified: I50.9

## 2013-11-25 HISTORY — DX: Other specified postprocedural states: Z98.890

## 2013-11-25 HISTORY — DX: Other specified postprocedural states: R11.2

## 2013-11-25 HISTORY — PX: ENTEROSCOPY: SHX5533

## 2013-11-25 SURGERY — ENTEROSCOPY
Anesthesia: Moderate Sedation

## 2013-11-25 MED ORDER — MIDAZOLAM HCL 10 MG/2ML IJ SOLN
INTRAMUSCULAR | Status: AC
Start: 2013-11-25 — End: 2013-11-25
  Filled 2013-11-25: qty 4

## 2013-11-25 MED ORDER — BUTAMBEN-TETRACAINE-BENZOCAINE 2-2-14 % EX AERO
INHALATION_SPRAY | CUTANEOUS | Status: DC | PRN
  Administered 2013-11-25: 2 via TOPICAL

## 2013-11-25 MED ORDER — MIDAZOLAM HCL 10 MG/2ML IJ SOLN
INTRAMUSCULAR | Status: DC | PRN
  Administered 2013-11-25 (×2): 2 mg via INTRAVENOUS
  Administered 2013-11-25: 1 mg via INTRAVENOUS

## 2013-11-25 MED ORDER — SODIUM CHLORIDE 0.9 % IV SOLN
INTRAVENOUS | Status: DC
  Administered 2013-11-25: 500 mL via INTRAVENOUS

## 2013-11-25 MED ORDER — FENTANYL CITRATE 0.05 MG/ML IJ SOLN
INTRAMUSCULAR | Status: DC | PRN
  Administered 2013-11-25 (×3): 25 ug via INTRAVENOUS

## 2013-11-25 MED ORDER — DIPHENHYDRAMINE HCL 50 MG/ML IJ SOLN
INTRAMUSCULAR | Status: AC
  Filled 2013-11-25: qty 1

## 2013-11-25 MED ORDER — FENTANYL CITRATE 0.05 MG/ML IJ SOLN
INTRAMUSCULAR | Status: AC
  Filled 2013-11-25: qty 2

## 2013-11-25 NOTE — Op Note (Signed)
[facility] [place], [address]   OPERATIVE PROCEDURE REPORT  PATIENT: Tyler, Hahn.  MR#: [id number] BIRTHDATE: June 19, 1940 , 72  yrs. old GENDER: Male ENDOSCOPIST: Ladene Artist, MD, [facility] REFERRED BY:  [name], M.D. PROCEDURE DATE: 11/25/2013 PROCEDURE:   Diagnostic small bowel enteroscopy ASA CLASS:   Class III INDICATIONS:  Abnl CT of jejunum, LUQ pain, wt loss. MEDICATIONS: These medications were titrated to patient response per physician's verbal order, Fentanyl 75 mcg IV, and Versed 5 mg IV  TOPICAL ANESTHETIC:   Cetacaine Spray DESCRIPTION OF PROCEDURE:   After the risks benefits and alternatives of the procedure were thoroughly explained, informed consent was obtained.  The Pentax VSB-2900  endoscope was introduced through the mouth  and advanced to the proximal jejunum jejunum , limited by Without limitations.   The instrument was slowly withdrawn as the mucosa was fully examined.  The esophagus and gastroesophageal junction were normal in appearance.   The stomach was entered and closely examined.  The antrum, angularis, and lesser curvature were well visualized, including a retroflexed view of the cardia and fundus.  The stomach wall was normally distensable. The scope passed easily through the pylorus into the duodenum.  The duodenal bulb was normal in appearance, as was the postbulbar, 3rd and 4th duodenum. Approximately 70 cm of jejunum was examined which was normal in appearance. Retroflexed views revealed no abnormalities. The scope was then withdrawn from the patient and the procedure terminated.  COMPLICATIONS: There were no complications.  ENDOSCOPIC IMPRESSION: 1.   Normal enteroscopy to proximal jejunum  RECOMMENDATIONS: 1.  MR enterography vs tertiary referral for deep enteroscopy   eSigned:  Ladene Artist, MD, [facility] 11/25/2013 8:13 AM

## 2013-11-25 NOTE — Discharge Instructions (Signed)

## 2013-11-25 NOTE — Telephone Encounter (Signed)
Per Dr. Fuller Plan patient needs MR enterography if possible with patient's renal insufficiency.  I spoke with Margreta Journey at [facility] and can be performed without IV contrast.  Patient is notified of MR enterography without IV contrast for 12/03/13 1:00 arrival for 2:15 appointment.  Needs to have nothing solid after 9:00 am

## 2013-11-25 NOTE — Interval H&P Note (Signed)
History and Physical Interval Note:  11/25/2013 7:39 AM  Tyler Hahn  has presented today for surgery, with the diagnosis of Abdnormal CT scan, weight loss  The various methods of treatment have been discussed with the patient and family. After consideration of risks, benefits and other options for treatment, the patient has consented to  Procedure(s): ENTEROSCOPY (N/A) as a surgical intervention .  The patient's history has been reviewed, patient examined, no change in status, stable for surgery.  I have reviewed the patient's chart and labs.  Questions were answered to the patient's satisfaction.     Pricilla Riffle. Fuller Plan MD

## 2013-11-25 NOTE — H&P (View-Only) (Signed)
    History of Present Illness: This is a 73 year old male accompanied by his wife and son. He has had a progressive, steady weight loss with anorexia and intermittent mild left upper quadrant pain. His family reports that  he has lost about 40 pounds over the past year. CT scan in late January 2015 showed:  Abnormal loop of proximal jejunum with wall thickening, inflammation, and retraction of the mesenteric. Malignancy involving this loop of bowel is a consideration. Inflammatory change from infectious or inflammatory enteritis, as well as the possibility of ischemic stricture, could all have this appearance as well.  He was scheduled for enteroscopy in February but canceled the procedure and has not rescheduled. We strongly advised him to reschedule the enteroscopy as soon as possible however he has not. He returns today to further discuss.    Current Medications, Allergies, Past Medical History, Past Surgical History, Family History and Social History were reviewed in [employer] record.  Physical Exam: General: Well developed , well nourished, no acute distress Head: Normocephalic and atraumatic Eyes:  sclerae anicteric, EOMI Ears: Normal auditory acuity Mouth: No deformity or lesions Lungs: Clear throughout to auscultation Heart: Regular rate and rhythm; no murmurs, rubs or bruits Abdomen: Soft, non tender and non distended. No masses, hepatosplenomegaly or hernias noted. Normal Bowel sounds Musculoskeletal: Symmetrical with no gross deformities  Pulses:  Normal pulses noted Extremities: No clubbing, cyanosis, edema or deformities noted Neurological: Alert oriented x 4, grossly nonfocal Psychological:  Alert and cooperative. Normal mood and affect  Assessment and Recommendations:  1. Abnl CT of jejunum. Weight loss. LUQ pain. We had a long discussion about his symptoms, the CT findings and they recommended evaluation. Ample time was allowed for further  questions and  I attempted to answer all questions from him, his wife and his son.  CBC, CMET today. Schedule CT enteroscopy and push enteroscopy. If the lesion cannot be reached we discussed referral to a tertiary center for deep enteroscopy.

## 2013-11-26 ENCOUNTER — Encounter (HOSPITAL_COMMUNITY): Payer: Self-pay | Admitting: Gastroenterology

## 2013-11-26 ENCOUNTER — Other Ambulatory Visit: Payer: Self-pay | Admitting: Gastroenterology

## 2013-11-26 DIAGNOSIS — R933 Abnormal findings on diagnostic imaging of other parts of digestive tract: Secondary | ICD-10-CM

## 2013-11-30 ENCOUNTER — Telehealth: Payer: Self-pay | Admitting: Internal Medicine

## 2013-11-30 NOTE — Telephone Encounter (Signed)
CERTIFIED LETTER SIGNED BY PATIENT / AND DID REMOTE TRANSMISSION 11-24-13/MT

## 2013-12-01 ENCOUNTER — Telehealth: Payer: Self-pay

## 2013-12-01 DIAGNOSIS — K6389 Other specified diseases of intestine: Secondary | ICD-10-CM

## 2013-12-01 NOTE — Telephone Encounter (Signed)
[name] called.  Patient can't have MR enterography due to internal defibrillator that is not MRI compatible.  Please advise the next step.  Marland Kitchen

## 2013-12-01 NOTE — Telephone Encounter (Signed)
Please reconfirm with [facility] about CT enterography without IV contrast. If they can do that then proceed. 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

## 2013-12-02 NOTE — Telephone Encounter (Signed)
I called and spoke with Vickie at [facility] and confirmed that CT enterography could not be performed without IV contrast.

## 2013-12-03 ENCOUNTER — Other Ambulatory Visit: Payer: Medicare HMO

## 2013-12-03 ENCOUNTER — Inpatient Hospital Stay: Admission: RE | Admit: 2013-12-03 | Payer: Medicare HMO | Source: Ambulatory Visit

## 2013-12-03 NOTE — Telephone Encounter (Signed)
Patient is scheduled to see Dr. Donella Stade 12/20/13 at 10:10.  Patient is notified of the appt date and time

## 2013-12-06 LAB — MDC_IDC_ENUM_SESS_TYPE_REMOTE
Battery Remaining Longevity: 7
Brady Statistic RA Percent Paced: 1 %
HighPow Impedance: 44 Ohm
Implantable Pulse Generator Serial Number: 131093
Lead Channel Impedance Value: 372 Ohm
Lead Channel Impedance Value: 571 Ohm
Lead Channel Sensing Intrinsic Amplitude: 3.8 mV
Lead Channel Setting Pacing Amplitude: 2 V
Lead Channel Setting Pacing Amplitude: 2.4 V
MDC IDC MSMT LEADCHNL RV SENSING INTR AMPL: 6.1 mV
MDC IDC SET LEADCHNL RV PACING PULSEWIDTH: 0.5 ms
MDC IDC SET ZONE DETECTION INTERVAL: 352.9 ms
MDC IDC STAT BRADY RV PERCENT PACED: 0 %
Zone Setting Detection Interval: 272.7 ms

## 2013-12-20 ENCOUNTER — Encounter (INDEPENDENT_AMBULATORY_CARE_PROVIDER_SITE_OTHER): Payer: Self-pay | Admitting: Surgery

## 2013-12-20 ENCOUNTER — Ambulatory Visit (INDEPENDENT_AMBULATORY_CARE_PROVIDER_SITE_OTHER): Payer: Medicare HMO | Admitting: Surgery

## 2013-12-20 VITALS — BP 122/78 | HR 63 | Temp 98.1°F | Ht 66.0 in | Wt 162.0 lb

## 2013-12-20 DIAGNOSIS — K6389 Other specified diseases of intestine: Secondary | ICD-10-CM | POA: Insufficient documentation

## 2013-12-20 NOTE — Progress Notes (Signed)
Patient ID: Tyler Hahn, male   DOB: [birth year], 73 y.o.   MRN: [id number]  Chief Complaint  Patient presents with  . eval small bowle mass    HPI Tyler Hahn is a 73 y.o. male.   HPI Patient sent at request of Dr. Silvio Pate for mass involving proximal jejunum on CT scan. He has lost 100 pounds over the last year has lack of appetite. Upper endoscopy normal. This area was detected in January of 2015. No blood in the stool. No history of anemia. Mild epigastric abdominal pain which is intermittent. Occasional nausea. No vomiting.  Past Medical History  Diagnosis Date  . Hypertension   . Diabetes mellitus   . Hyperlipidemia   . Hyperthyroidism   . Gout   . Renal insufficiency   . PONV (postoperative nausea and vomiting)   . CHF (congestive heart failure)     2000    Past Surgical History  Procedure Laterality Date  . Cardiac defibrillator placement    . [facility]    . Polyp removal throat      difficulty speaking  . Enteroscopy N/A 11/25/2013    Procedure: ENTEROSCOPY;  Surgeon: Ladene Artist, MD;  Location: [facility];  Service: Endoscopy;  Laterality: N/A;    Family History  Problem Relation Age of Onset  . Stomach cancer Mother   . Diabetes Father   . Heart disease Father   . Liver disease Father   . Kidney disease Father     Social History History  Substance Use Topics  . Smoking status: Former Smoker    Types: Cigarettes    Quit date: 11/26/1998  . Smokeless tobacco: Never Used  . Alcohol Use: No    Allergies  Allergen Reactions  . Sulfonamide Derivatives     REACTION: itching    Current Outpatient Prescriptions  Medication Sig Dispense Refill  . allopurinol (ZYLOPRIM) 100 MG tablet TAKE ONE & ONE-HALF TABLETS BY MOUTH EVERY DAY  45 tablet  5  . atorvastatin (LIPITOR) 20 MG tablet TAKE ONE TABLET BY MOUTH ONCE DAILY  30 tablet  5  . calcitRIOL (ROCALTROL) 0.5 MCG capsule Take 0.5 mcg by mouth daily.      . carvedilol (COREG) 6.25 MG  tablet Take 6.25 mg by mouth 2 (two) times daily with a meal.      . colchicine (COLCRYS) 0.6 MG tablet Take 1 tablet (0.6 mg total) by mouth daily.  30 tablet  5  . diclofenac sodium (VOLTAREN) 1 % GEL Apply 4 g topically 4 (four) times daily.  100 g  3  . erythromycin ophthalmic ointment 1 application at bedtime.      Marland Kitchen lisinopril (PRINIVIL,ZESTRIL) 20 MG tablet TAKE ONE TABLET BY MOUTH ONCE DAILY  30 tablet  5  . methimazole (TAPAZOLE) 10 MG tablet Take 2 tablets (20 mg total) by mouth 2 (two) times daily.  120 tablet  0  . Omega-3 Fatty Acids (FISH OIL) 1200 MG CAPS Take by mouth daily.        Marland Kitchen omeprazole (PRILOSEC) 40 MG capsule Take 1 capsule (40 mg total) by mouth daily.  30 capsule  11  . propylthiouracil (PTU) 50 MG tablet 4 tabs, twice a day  240 tablet  2  . torsemide (DEMADEX) 20 MG tablet TAKE ONE TABLET BY MOUTH ONCE DAILY  30 tablet  5  . Vitamin D, Ergocalciferol, (DRISDOL) 50000 UNITS CAPS capsule Take 1 capsule (50,000 Units total) by mouth every 7 (seven) days.  12 capsule  1  . [DISCONTINUED] atenolol (TENORMIN) 50 MG tablet Take 75 mg by mouth daily.        . [DISCONTINUED] Insulin Glargine (LANTUS SOLOSTAR [place]) Inject into the skin as directed.         No current facility-administered medications for this visit.    Review of Systems Review of Systems  Constitutional: Positive for unexpected weight change. Negative for fever and chills.  HENT: Negative for congestion, hearing loss, sore throat, trouble swallowing and voice change.   Eyes: Negative for visual disturbance.  Respiratory: Negative for cough and wheezing.   Cardiovascular: Negative for chest pain, palpitations and leg swelling.  Gastrointestinal: Positive for abdominal pain. Negative for nausea, vomiting, diarrhea, constipation, blood in stool, abdominal distention, anal bleeding and rectal pain.  Genitourinary: Negative for hematuria and difficulty urinating.  Musculoskeletal: Negative for arthralgias.   Skin: Negative for rash and wound.  Neurological: Negative for seizures, syncope, weakness and headaches.  Hematological: Negative for adenopathy. Does not bruise/bleed easily.  Psychiatric/Behavioral: Negative for confusion.    Blood pressure 122/78, pulse 63, temperature 98.1 F (36.7 C), height 5\' 6"  (1.676 m), weight 162 lb (73.483 kg).  Physical Exam Physical Exam  Constitutional: He is oriented to person, place, and time. He appears well-developed and well-nourished.  HENT:  Head: Normocephalic.  Mouth/Throat: No oropharyngeal exudate.  Eyes: Pupils are equal, round, and reactive to light. No scleral icterus.  Neck: Normal range of motion.  Cardiovascular: Normal rate and regular rhythm.   Pulmonary/Chest: Effort normal and breath sounds normal.  Abdominal: He exhibits no distension and no mass. There is no tenderness. There is no rebound and no guarding.  Musculoskeletal: Normal range of motion.  Lymphadenopathy:    He has no cervical adenopathy.  Neurological: He is alert and oriented to person, place, and time.  Skin: Skin is warm and dry.  Psychiatric: He has a normal mood and affect. His behavior is normal. Judgment and thought content normal.    Data Reviewed CLINICAL DATA: Epigastric pain and slight nausea for a few months  with 50 lb weight loss  EXAM:  CT ABDOMEN AND PELVIS WITHOUT CONTRAST  TECHNIQUE:  Multidetector CT imaging of the abdomen and pelvis was performed  following the standard protocol without intravenous contrast.  COMPARISON: None.  FINDINGS:  Liver and gallbladder are normal. Spleen is normal. Pancreas is  normal. Adrenal glands are normal. There are numerous bilateral  round renal lesions. These appear generally of low attenuation and  may represent cysts ranging in size from 1-3 cm.  There is atherosclerotic calcification of the aorta and iliac  vessels. There is calcification of the celiac and superior  mesenteric arteries. 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 Iliac arteries are also dilated 18 mm on the right and 14  mm on the left.  Bladder is normal. Reproductive organs are normal.  There is mild diverticulosis of the sigmoid colon without evidence  of diverticulitis. There is no significant adenopathy or free fluid.  There is an abnormal loop of proximal jejunum in the left upper  quadrant. This is best seen on images 30 through 35 series 2. This  is also seen well on the coronal series. There is a 5 cm long loop  of proximal jejunum showing moderate to severe wall thickening and  mild surrounding inflammatory change. There is retractile change in  the mesentery of this loop of bowel with inflammatory change  extending into  the mesenteric a as well.  Visualized portions of the lung bases are clear. There are no acute  musculoskeletal findings.  IMPRESSION:  Abnormal loop of proximal jejunum with wall thickening,  inflammation, and retraction of the mesenteric. Malignancy involving  this loop of bowel is a consideration. Inflammatory change from  infectious or inflammatory enteritis, as well as the possibility of  ischemic stricture, could all have this appearance as well.  Electronically Signed  By: Skipper Cliche M.D.  On: 07/16/2013 16:17   Assessment    Small bowel mass proximal  jejunum     possible stricture versus malignancy Patient Active Problem List   Diagnosis Date Noted  . Small bowel mass 12/20/2013  . Abdominal pain, left upper quadrant 11/25/2013  . Myalgia 07/07/2013  . Hyperthyroidism 05/06/2013  . Loss of weight 04/19/2013  . Polyarthralgia 04/19/2013  . Loss of appetite 04/19/2013  . 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 Right elbow pain 08/17/2012  . CARDIOMYOPATHY, SECONDARY 06/07/2010  . SYSTOLIC HEART FAILURE, CHRONIC 06/07/2010  . RENAL INSUFFICIENCY 04/04/2010  . TESTICULAR MASS 04/04/2010  . DM (diabetes mellitus) type II controlled with renal  manifestation 03/07/2010  . HYPERLIPIDEMIA 03/07/2010  . GOUT 03/07/2010  . HYPERTENSION, BENIGN ESSENTIAL 03/07/2010  . IMPLANTABLE CARDIAC DEFIBRILLATOR -[employer]-SINGLE 03/07/2010      Plan    Discussed options although laparoscopy, laparotomy and resection versus deep endoscopy for further evaluation. His last a significant amount of weight without trying and has loss of appetite. Concern is for malignancy and this  best determined laparoscopy and resection if needed. He does have multiple medical problems. He will need cardiac clearance prior to any surgical intervention.The procedure has been discussed with the patient.  Alternative therapies have been discussed with the patient.  Operative risks include bleeding,  Infection, anastomosis leak,   Organ injury,  Nerve injury,  Blood vessel injury,  DVT,  Pulmonary embolism,  Death,  And possible reoperation.  Medical management risks include worsening of present situation.  The success of the procedure is 50 -90 % at treating patients symptoms.  The patient understands and agrees to proceed.       Rona Tomson A. 12/20/2013, 10:49 AM

## 2013-12-20 NOTE — Patient Instructions (Signed)
Laparoscopic Small Bowel Resection Small bowel resection is surgery to remove part of the small bowel. The small bowel, also called the small intestine, is the top part of your intestines. It is part of the digestive system. A small bowel resection may be needed if the small bowel becomes blocked or harmed by disease. The laparoscopic technique is one method used for performing this surgery. This method allows the surgery to be done through smaller incisions and allows for a faster recovery than with a regular open surgery.  LET [facility] CARE PROVIDER KNOW ABOUT:   Any allergies you have.  All medicines you are taking, including vitamins, herbs, eye drops, creams, and over-the-counter medicines.  Previous problems you or members of your family have had with the use of anesthetics.  Any blood disorders you have.  Previous surgeries you have had.  Medical conditions you have. RISKS AND COMPLICATIONS  Generally, this is a safe procedure. However, as with any procedure, complications can occur. Possible complications include:  Infection.  Bleeding.  Injury to other organs.  Problems related to anesthetics.  Leakage from where the bowel is put back together (anastomosis). This can cause intestinal contents to leak into the abdomen. Another surgery may be required to repair the leak.  Long delay before return of bowel function (ileus).  A blood clot that forms somewhere in your veins and travels to the lung.   A hernia. This occurs when the abdomen bulges out. It may require surgery in the future.  Scarring where the incision is made or inside your body, around the intestines. If this occurs, surgery may be required in the future.  Not being able to absorb enough vitamins and nutrition through the small bowel. BEFORE THE PROCEDURE   You may need to have various tests done before the procedure. These may include blood tests, X-ray exams, and imaging scans that take pictures of  the small bowel.   Do not eat or drink anything for at least 8 hours before the procedure.   Make plans to have someone drive you home after your hospital stay. Also arrange for someone to help you with activities during recovery.  PROCEDURE  This surgery can take 2-4 hours.  Small monitors will be put on your body. They are used to check your heart, blood pressure, and oxygen level.  An IV access tube will be put into one of your veins. Medicine will be able to flow directly into your body through this IV tube.  You might be given a medicine to help you relax (sedative).  You will be given a medicine to make you sleep (general anesthetic). A breathing tube will be placed into your lungs during the procedure.  A flexible tube (catheter) will be put into your bladder to collect urine.  A tube may be put through your nose and into your stomach (nasogastric tube). It is used to remove stomach fluid after surgery until the intestines start working again.  Several small cuts (incisions) are made in your abdomen.  Your abdomen will be filled with a gas so that it expands. This gives the surgeon more room to operate and makes your organs easier to see.  A thin, lighted tube with a tiny camera on the end (laparoscope) is put through one of the small incisions. The camera on the laparoscope sends a picture to a TV screen in the operating room. This gives the surgeon a good view inside the abdomen.  Hollow tubes are put through  the other small incisions in your abdomen. The tools needed for the procedure are put through these tubes.  The portion of small bowel to be removed is taken out through one of the incisions. The surgeon then connects the bowel together again. The incision may need to be enlarged if the diseased part of the bowel is too large to be removed through one of the smaller incisions. In this case, the bowel repair is sometimes made outside the abdomen and then the new bowel  connection is returned to the abdomen.  The surgeon will close the incisions with staples or stitches. AFTER THE PROCEDURE   You will be taken to a recovery area where you will be closely monitored. When you are stable, you will be moved to a regular hospital room.  You will need to stay in the hospital until bowel function returns (passing gas, bowel movements). This may take a few days.  You may have a nasogastric tube in place until bowel function returns. Once bowel function has returned, the tube is removed from your nose, and you can start on liquids. You will slowly be advanced back to solid foods. Document Released: 11/27/2000 Document Revised: 03/24/2013 Document Reviewed: 01/13/2013 [facility] Patient Information 2015 [place], [place]. This information is not intended to replace advice given to you by your health care provider. Make sure you discuss any questions you have with your health care provider.

## 2013-12-21 ENCOUNTER — Encounter: Payer: Self-pay | Admitting: Cardiology

## 2013-12-28 ENCOUNTER — Encounter: Payer: Self-pay | Admitting: Internal Medicine

## 2014-01-02 ENCOUNTER — Other Ambulatory Visit: Payer: Self-pay | Admitting: Internal Medicine

## 2014-01-14 ENCOUNTER — Encounter (HOSPITAL_COMMUNITY): Payer: Self-pay | Admitting: Pharmacy Technician

## 2014-01-14 ENCOUNTER — Other Ambulatory Visit: Payer: Self-pay | Admitting: Endocrinology

## 2014-01-19 ENCOUNTER — Encounter (HOSPITAL_COMMUNITY): Payer: Self-pay

## 2014-01-19 ENCOUNTER — Encounter (HOSPITAL_COMMUNITY)
Admission: RE | Admit: 2014-01-19 | Discharge: 2014-01-19 | Disposition: A | Discharge status: Home / Self Care | Payer: Medicare HMO | Source: Ambulatory Visit | Attending: Surgery | Admitting: Surgery

## 2014-01-19 ENCOUNTER — Ambulatory Visit (HOSPITAL_COMMUNITY)
Admission: RE | Admit: 2014-01-19 | Discharge: 2014-01-19 | Disposition: A | Discharge status: Home / Self Care | Payer: Medicare HMO | Source: Ambulatory Visit | Attending: Surgery | Admitting: Surgery

## 2014-01-19 DIAGNOSIS — Z01818 Encounter for other preprocedural examination: Secondary | ICD-10-CM | POA: Diagnosis present

## 2014-01-19 HISTORY — DX: Presence of automatic (implantable) cardiac defibrillator: Z95.810

## 2014-01-19 HISTORY — DX: Anxiety disorder, unspecified: F41.9

## 2014-01-19 LAB — COMPREHENSIVE METABOLIC PANEL
ALBUMIN: 3.9 g/dL (ref 3.5–5.2)
ALT: 21 U/L (ref 0–53)
ANION GAP: 16 — AB (ref 5–15)
AST: 17 U/L (ref 0–37)
Alkaline Phosphatase: 91 U/L (ref 39–117)
BUN: 95 mg/dL — AB (ref 6–23)
CO2: 15 meq/L — AB (ref 19–32)
CREATININE: 2.99 mg/dL — AB (ref 0.50–1.35)
Calcium: 9.2 mg/dL (ref 8.4–10.5)
Chloride: 107 mEq/L (ref 96–112)
GFR calc Af Amer: 23 mL/min — ABNORMAL LOW (ref >90)
GFR, EST NON AFRICAN AMERICAN: 20 mL/min — AB (ref >90)
Glucose, Bld: 92 mg/dL (ref 70–99)
Potassium: 6.2 mEq/L — ABNORMAL HIGH (ref 3.7–5.3)
Sodium: 138 mEq/L (ref 137–147)
Total Bilirubin: <0.2 mg/dL — ABNORMAL LOW (ref 0.3–1.2)
Total Protein: 8 g/dL (ref 6.0–8.3)

## 2014-01-19 LAB — CBC WITH DIFFERENTIAL/PLATELET
BASOS PCT: 0 % (ref 0–1)
Basophils Absolute: 0 10*3/uL (ref 0.0–0.1)
Eosinophils Absolute: 0.3 10*3/uL (ref 0.0–0.7)
Eosinophils Relative: 6 % — ABNORMAL HIGH (ref 0–5)
HEMATOCRIT: 28.8 % — AB (ref 39.0–52.0)
HEMOGLOBIN: 9.3 g/dL — AB (ref 13.0–17.0)
Lymphocytes Relative: 27 % (ref 12–46)
Lymphs Abs: 1.3 10*3/uL (ref 0.7–4.0)
MCH: 27.6 pg (ref 26.0–34.0)
MCHC: 32.3 g/dL (ref 30.0–36.0)
MCV: 85.5 fL (ref 78.0–100.0)
MONO ABS: 0.3 10*3/uL (ref 0.1–1.0)
MONOS PCT: 6 % (ref 3–12)
Neutro Abs: 3 10*3/uL (ref 1.7–7.7)
Neutrophils Relative %: 61 % (ref 43–77)
Platelets: 118 10*3/uL — ABNORMAL LOW (ref 150–400)
RBC: 3.37 MIL/uL — ABNORMAL LOW (ref 4.22–5.81)
RDW: 14.7 % (ref 11.5–15.5)
WBC: 4.9 10*3/uL (ref 4.0–10.5)

## 2014-01-19 NOTE — Pre-Procedure Instructions (Signed)
Tyler Hahn  01/19/2014   Your procedure is scheduled on:  01/27/14  Report to [facility] Admitting at 530 AM.  Call this number if you have problems the morning of surgery: [phone]   Remember:   Do not eat food or drink liquids after midnight.   Take these medicines the morning of surgery with A SIP OF WATER: allopurinol,carvedilol,prilosec   Do not wear jewelry, make-up or nail polish.  Do not wear lotions, powders, or perfumes. You may wear deodorant.  Do not shave 48 hours prior to surgery. Men may shave face and neck.  Do not bring valuables to the hospital.  [facility] is not responsible                  for any belongings or valuables.               Contacts, dentures or bridgework may not be worn into surgery.  Leave suitcase in the car. After surgery it may be brought to your room.  For patients admitted to the hospital, discharge time is determined by your                treatment team.               Patients discharged the day of surgery will not be allowed to drive  home.  Name and phone number of your driver: family  Special Instructions: Shower using CHG 2 nights before surgery and the night before surgery.  If you shower the day of surgery use CHG.  Use special wash - you have one bottle of CHG for all showers.  You should use approximately 1/3 of the bottle for each shower.   Please read over the following fact sheets that you were given: Pain Booklet, Coughing and Deep Breathing and Surgical Site Infection Prevention

## 2014-01-19 NOTE — Pre-Procedure Instructions (Signed)
Tyler Hahn  01/19/2014   Your procedure is scheduled on:  01/27/14  Report to [facility] Admitting at 530 AM.  Call this number if you have problems the morning of surgery: [phone]   Remember:   Do not eat food or drink liquids after midnight.   Take these medicines the morning of surgery with A SIP OF WATER: allopurinol,carvedilol,prilosec   Do not wear jewelry, make-up or nail polish.  Do not wear lotions, powders, or perfumes. You may wear deodorant.  Do not shave 48 hours prior to surgery. Men may shave face and neck.  Do not bring valuables to the hospital.  [facility] is not responsible                  for any belongings or valuables.               Contacts, dentures or bridgework may not be worn into surgery.  Leave suitcase in the car. After surgery it may be brought to your room.  For patients admitted to the hospital, discharge time is determined by your                treatment team.               Patients discharged the day of surgery will not be allowed to drive  home.  Name and phone number of your driver: family  Special Instructions: Shower using CHG 2 nights before surgery and the night before surgery.  If you shower the day of surgery use CHG.  Use special wash - you have one bottle of CHG for all showers.  You should use approximately 1/3 of the bottle for each shower.   Please read over the following fact sheets that you were given: Pain Booklet, Coughing and Deep Breathing and Surgical Site Infection Prevention

## 2014-01-19 NOTE — Pre-Procedure Instructions (Signed)
KAVONTAE WOLFGRAMM  01/19/2014   Your procedure is scheduled on:  01/27/14  Report to [facility] Admitting at 530 AM.  Call this number if you have problems the morning of surgery: [phone]   Remember:   Do not eat food or drink liquids after midnight.   Take these medicines the morning of surgery with A SIP OF WATER: allopurinol,carvedilol,prilosec   Do not wear jewelry, make-up or nail polish.  Do not wear lotions, powders, or perfumes. You may wear deodorant.  Do not shave 48 hours prior to surgery. Men may shave face and neck.  Do not bring valuables to the hospital.  [facility] is not responsible                  for any belongings or valuables.               Contacts, dentures or bridgework may not be worn into surgery.  Leave suitcase in the car. After surgery it may be brought to your room.  For patients admitted to the hospital, discharge time is determined by your                treatment team.               Patients discharged the day of surgery will not be allowed to drive  home.  Name and phone number of your driver: family  Special Instructions: Shower using CHG 2 nights before surgery and the night before surgery.  If you shower the day of surgery use CHG.  Use special wash - you have one bottle of CHG for all showers.  You should use approximately 1/3 of the bottle for each shower.   Please read over the following fact sheets that you were given: Pain Booklet, Coughing and Deep Breathing and Surgical Site Infection Prevention

## 2014-01-19 NOTE — Progress Notes (Signed)
Form faxed to gregg Lovena Le.and joey with [employer] notified

## 2014-01-26 MED ORDER — CHLORHEXIDINE GLUCONATE 4 % EX LIQD
1.0000 "application " | Freq: Once | CUTANEOUS | Status: DC
  Filled 2014-01-26: qty 15

## 2014-01-26 MED ORDER — CEFOXITIN SODIUM 2 G IV SOLR
2.0000 g | INTRAVENOUS | Status: AC
  Administered 2014-01-27: 2 g via INTRAVENOUS
  Filled 2014-01-26: qty 2

## 2014-01-27 ENCOUNTER — Inpatient Hospital Stay (HOSPITAL_COMMUNITY): Payer: Medicare HMO | Admitting: Certified Registered"

## 2014-01-27 ENCOUNTER — Observation Stay (HOSPITAL_COMMUNITY)
Admission: RE | Admit: 2014-01-27 | Discharge: 2014-01-28 | Disposition: A | Discharge status: Home / Self Care | Payer: Medicare HMO | Source: Ambulatory Visit | Attending: Surgery | Admitting: Surgery

## 2014-01-27 ENCOUNTER — Encounter (HOSPITAL_COMMUNITY): Payer: Medicare HMO | Admitting: Certified Registered"

## 2014-01-27 ENCOUNTER — Encounter (HOSPITAL_COMMUNITY): Payer: Self-pay | Admitting: *Deleted

## 2014-01-27 ENCOUNTER — Encounter (HOSPITAL_COMMUNITY)
Admission: RE | Disposition: A | Discharge status: Home / Self Care | Payer: Self-pay | Source: Ambulatory Visit | Attending: Surgery

## 2014-01-27 DIAGNOSIS — N058 Unspecified nephritic syndrome with other morphologic changes: Secondary | ICD-10-CM | POA: Insufficient documentation

## 2014-01-27 DIAGNOSIS — M2550 Pain in unspecified joint: Secondary | ICD-10-CM | POA: Insufficient documentation

## 2014-01-27 DIAGNOSIS — IMO0002 Reserved for concepts with insufficient information to code with codable children: Secondary | ICD-10-CM | POA: Diagnosis not present

## 2014-01-27 DIAGNOSIS — I429 Cardiomyopathy, unspecified: Secondary | ICD-10-CM | POA: Diagnosis not present

## 2014-01-27 DIAGNOSIS — Z95810 Presence of automatic (implantable) cardiac defibrillator: Secondary | ICD-10-CM | POA: Diagnosis not present

## 2014-01-27 DIAGNOSIS — M109 Gout, unspecified: Secondary | ICD-10-CM | POA: Insufficient documentation

## 2014-01-27 DIAGNOSIS — I10 Essential (primary) hypertension: Secondary | ICD-10-CM | POA: Insufficient documentation

## 2014-01-27 DIAGNOSIS — R1013 Epigastric pain: Secondary | ICD-10-CM | POA: Diagnosis present

## 2014-01-27 DIAGNOSIS — Z79899 Other long term (current) drug therapy: Secondary | ICD-10-CM | POA: Insufficient documentation

## 2014-01-27 DIAGNOSIS — G5620 Lesion of ulnar nerve, unspecified upper limb: Secondary | ICD-10-CM | POA: Insufficient documentation

## 2014-01-27 DIAGNOSIS — I509 Heart failure, unspecified: Secondary | ICD-10-CM | POA: Diagnosis not present

## 2014-01-27 DIAGNOSIS — Z87891 Personal history of nicotine dependence: Secondary | ICD-10-CM | POA: Diagnosis not present

## 2014-01-27 DIAGNOSIS — I5022 Chronic systolic (congestive) heart failure: Secondary | ICD-10-CM | POA: Diagnosis not present

## 2014-01-27 DIAGNOSIS — E1129 Type 2 diabetes mellitus with other diabetic kidney complication: Secondary | ICD-10-CM | POA: Insufficient documentation

## 2014-01-27 DIAGNOSIS — N289 Disorder of kidney and ureter, unspecified: Secondary | ICD-10-CM | POA: Diagnosis not present

## 2014-01-27 DIAGNOSIS — R634 Abnormal weight loss: Principal | ICD-10-CM | POA: Insufficient documentation

## 2014-01-27 DIAGNOSIS — E0590 Thyrotoxicosis, unspecified without thyrotoxic crisis or storm: Secondary | ICD-10-CM | POA: Diagnosis not present

## 2014-01-27 DIAGNOSIS — E785 Hyperlipidemia, unspecified: Secondary | ICD-10-CM | POA: Insufficient documentation

## 2014-01-27 DIAGNOSIS — IMO0001 Reserved for inherently not codable concepts without codable children: Secondary | ICD-10-CM | POA: Diagnosis not present

## 2014-01-27 HISTORY — PX: LAPAROSCOPY: SHX197

## 2014-01-27 HISTORY — PX: DIAGNOSTIC LAPAROSCOPY: SUR761

## 2014-01-27 LAB — GLUCOSE, CAPILLARY
Glucose-Capillary: 89 mg/dL (ref 70–99)
Glucose-Capillary: 125 mg/dL — ABNORMAL HIGH (ref 70–99)
Glucose-Capillary: 127 mg/dL — ABNORMAL HIGH (ref 70–99)
Glucose-Capillary: 169 mg/dL — ABNORMAL HIGH (ref 70–99)
Glucose-Capillary: 131 mg/dL — ABNORMAL HIGH (ref 70–99)

## 2014-01-27 LAB — CBC
HEMATOCRIT: 30.2 % — AB (ref 39.0–52.0)
Hemoglobin: 9.8 g/dL — ABNORMAL LOW (ref 13.0–17.0)
MCH: 27.3 pg (ref 26.0–34.0)
MCHC: 32.5 g/dL (ref 30.0–36.0)
MCV: 84.1 fL (ref 78.0–100.0)
Platelets: 126 10*3/uL — ABNORMAL LOW (ref 150–400)
RBC: 3.59 MIL/uL — AB (ref 4.22–5.81)
RDW: 14.3 % (ref 11.5–15.5)
WBC: 7.5 10*3/uL (ref 4.0–10.5)

## 2014-01-27 LAB — POCT I-STAT 4, (NA,K, GLUC, HGB,HCT)
Glucose, Bld: 82 mg/dL (ref 70–99)
HCT: 30 % — ABNORMAL LOW (ref 39.0–52.0)
HEMOGLOBIN: 10.2 g/dL — AB (ref 13.0–17.0)
Potassium: 5.3 mEq/L (ref 3.7–5.3)
Sodium: 141 mEq/L (ref 137–147)

## 2014-01-27 LAB — CREATININE, SERUM
Creatinine, Ser: 2.07 mg/dL — ABNORMAL HIGH (ref 0.50–1.35)
GFR, EST AFRICAN AMERICAN: 35 mL/min — AB (ref >90)
GFR, EST NON AFRICAN AMERICAN: 31 mL/min — AB (ref >90)

## 2014-01-27 LAB — TYPE AND SCREEN
ABO/RH(D): AB POS
Antibody Screen: NEGATIVE

## 2014-01-27 LAB — ABO/RH: ABO/RH(D): AB POS

## 2014-01-27 SURGERY — LAPAROSCOPY, DIAGNOSTIC
Anesthesia: General | Site: Abdomen

## 2014-01-27 MED ORDER — MIDAZOLAM HCL 2 MG/2ML IJ SOLN
INTRAMUSCULAR | Status: AC
  Filled 2014-01-27: qty 2

## 2014-01-27 MED ORDER — LIDOCAINE HCL (CARDIAC) 20 MG/ML IV SOLN
INTRAVENOUS | Status: DC | PRN
  Administered 2014-01-27: 50 mg via INTRAVENOUS

## 2014-01-27 MED ORDER — FENTANYL CITRATE 0.05 MG/ML IJ SOLN
INTRAMUSCULAR | Status: DC | PRN
  Administered 2014-01-27 (×3): 50 ug via INTRAVENOUS
  Administered 2014-01-27: 100 ug via INTRAVENOUS

## 2014-01-27 MED ORDER — ATORVASTATIN CALCIUM 20 MG PO TABS
20.0000 mg | ORAL_TABLET | Freq: Every day | ORAL | Status: DC
  Administered 2014-01-27: 20 mg via ORAL
  Filled 2014-01-27: qty 2
  Filled 2014-01-27 (×2): qty 1

## 2014-01-27 MED ORDER — GLYCOPYRROLATE 0.2 MG/ML IJ SOLN
INTRAMUSCULAR | Status: AC
  Filled 2014-01-27: qty 1

## 2014-01-27 MED ORDER — ERYTHROMYCIN 5 MG/GM OP OINT
1.0000 "application " | TOPICAL_OINTMENT | Freq: Every day | OPHTHALMIC | Status: DC
  Administered 2014-01-27: 1 via OPHTHALMIC
  Filled 2014-01-27: qty 3.5

## 2014-01-27 MED ORDER — NEOSTIGMINE METHYLSULFATE 10 MG/10ML IV SOLN
INTRAVENOUS | Status: DC | PRN
  Administered 2014-01-27: 1 mg via INTRAVENOUS
  Administered 2014-01-27: 3 mg via INTRAVENOUS

## 2014-01-27 MED ORDER — MIDAZOLAM HCL 5 MG/5ML IJ SOLN
INTRAMUSCULAR | Status: DC | PRN
  Administered 2014-01-27: 2 mg via INTRAVENOUS

## 2014-01-27 MED ORDER — DEXTROSE-NACL 5-0.9 % IV SOLN
INTRAVENOUS | Status: DC
  Administered 2014-01-27: 14:00:00 via INTRAVENOUS

## 2014-01-27 MED ORDER — ENOXAPARIN SODIUM 40 MG/0.4ML ~~LOC~~ SOLN
40.0000 mg | SUBCUTANEOUS | Status: DC
  Administered 2014-01-28: 40 mg via SUBCUTANEOUS
  Filled 2014-01-27 (×2): qty 0.4

## 2014-01-27 MED ORDER — LACTATED RINGERS IV SOLN: INTRAVENOUS | Status: DC | PRN

## 2014-01-27 MED ORDER — ONDANSETRON HCL 4 MG/2ML IJ SOLN
INTRAMUSCULAR | Status: AC
  Filled 2014-01-27: qty 2

## 2014-01-27 MED ORDER — PROPOFOL 10 MG/ML IV BOLUS
INTRAVENOUS | Status: AC
  Filled 2014-01-27: qty 20

## 2014-01-27 MED ORDER — ETOMIDATE 2 MG/ML IV SOLN
INTRAVENOUS | Status: AC
  Filled 2014-01-27: qty 10

## 2014-01-27 MED ORDER — SUCCINYLCHOLINE CHLORIDE 20 MG/ML IJ SOLN
INTRAMUSCULAR | Status: AC
  Filled 2014-01-27: qty 1

## 2014-01-27 MED ORDER — MORPHINE SULFATE 2 MG/ML IJ SOLN: 2.0000 mg | INTRAMUSCULAR | Status: DC | PRN

## 2014-01-27 MED ORDER — DEXAMETHASONE SODIUM PHOSPHATE 4 MG/ML IJ SOLN
INTRAMUSCULAR | Status: DC | PRN
  Administered 2014-01-27: 4 mg via INTRAVENOUS

## 2014-01-27 MED ORDER — FENTANYL CITRATE 0.05 MG/ML IJ SOLN
INTRAMUSCULAR | Status: AC
  Filled 2014-01-27: qty 2

## 2014-01-27 MED ORDER — EPHEDRINE SULFATE 50 MG/ML IJ SOLN
INTRAMUSCULAR | Status: AC
  Filled 2014-01-27: qty 1

## 2014-01-27 MED ORDER — FENTANYL CITRATE 0.05 MG/ML IJ SOLN
INTRAMUSCULAR | Status: AC
  Filled 2014-01-27: qty 5

## 2014-01-27 MED ORDER — ONDANSETRON HCL 4 MG/2ML IJ SOLN
INTRAMUSCULAR | Status: DC | PRN
  Administered 2014-01-27: 4 mg via INTRAVENOUS

## 2014-01-27 MED ORDER — ROCURONIUM BROMIDE 100 MG/10ML IV SOLN
INTRAVENOUS | Status: DC | PRN
  Administered 2014-01-27: 10 mg via INTRAVENOUS
  Administered 2014-01-27: 30 mg via INTRAVENOUS

## 2014-01-27 MED ORDER — NEOSTIGMINE METHYLSULFATE 10 MG/10ML IV SOLN
INTRAVENOUS | Status: AC
  Filled 2014-01-27: qty 1

## 2014-01-27 MED ORDER — BUPIVACAINE-EPINEPHRINE (PF) 0.25% -1:200000 IJ SOLN
INTRAMUSCULAR | Status: DC | PRN
  Administered 2014-01-27: 2 mL

## 2014-01-27 MED ORDER — PHENYLEPHRINE 40 MCG/ML (10ML) SYRINGE FOR IV PUSH (FOR BLOOD PRESSURE SUPPORT)
PREFILLED_SYRINGE | INTRAVENOUS | Status: AC
  Filled 2014-01-27: qty 10

## 2014-01-27 MED ORDER — LIDOCAINE HCL (CARDIAC) 20 MG/ML IV SOLN
INTRAVENOUS | Status: AC
  Filled 2014-01-27: qty 5

## 2014-01-27 MED ORDER — ONDANSETRON HCL 4 MG/2ML IJ SOLN: 4.0000 mg | Freq: Four times a day (QID) | INTRAMUSCULAR | Status: DC | PRN

## 2014-01-27 MED ORDER — SODIUM CHLORIDE 0.9 % IV SOLN
INTRAVENOUS | Status: DC | PRN
  Administered 2014-01-27 (×2): via INTRAVENOUS

## 2014-01-27 MED ORDER — GLYCOPYRROLATE 0.2 MG/ML IJ SOLN
INTRAMUSCULAR | Status: AC
  Filled 2014-01-27: qty 2

## 2014-01-27 MED ORDER — PANTOPRAZOLE SODIUM 40 MG PO TBEC
40.0000 mg | DELAYED_RELEASE_TABLET | Freq: Every day | ORAL | Status: DC
  Administered 2014-01-28: 40 mg via ORAL
  Filled 2014-01-27: qty 1

## 2014-01-27 MED ORDER — ROCURONIUM BROMIDE 50 MG/5ML IV SOLN
INTRAVENOUS | Status: AC
  Filled 2014-01-27: qty 1

## 2014-01-27 MED ORDER — DEXAMETHASONE SODIUM PHOSPHATE 4 MG/ML IJ SOLN
INTRAMUSCULAR | Status: AC
  Filled 2014-01-27: qty 1

## 2014-01-27 MED ORDER — SUCCINYLCHOLINE CHLORIDE 20 MG/ML IJ SOLN
INTRAMUSCULAR | Status: DC | PRN
  Administered 2014-01-27: 100 mg via INTRAVENOUS

## 2014-01-27 MED ORDER — SODIUM CHLORIDE 0.9 % IJ SOLN
INTRAMUSCULAR | Status: AC
  Filled 2014-01-27: qty 10

## 2014-01-27 MED ORDER — 0.9 % SODIUM CHLORIDE (POUR BTL) OPTIME
TOPICAL | Status: DC | PRN
  Administered 2014-01-27 (×2): 1000 mL

## 2014-01-27 MED ORDER — CARVEDILOL 6.25 MG PO TABS
6.2500 mg | ORAL_TABLET | Freq: Two times a day (BID) | ORAL | Status: DC
  Administered 2014-01-27 – 2014-01-28 (×2): 6.25 mg via ORAL
  Filled 2014-01-27 (×4): qty 1

## 2014-01-27 MED ORDER — FENTANYL CITRATE 0.05 MG/ML IJ SOLN
25.0000 ug | INTRAMUSCULAR | Status: DC | PRN
  Administered 2014-01-27: 25 ug via INTRAVENOUS

## 2014-01-27 MED ORDER — METHIMAZOLE 10 MG PO TABS
20.0000 mg | ORAL_TABLET | Freq: Two times a day (BID) | ORAL | Status: DC
  Administered 2014-01-27 – 2014-01-28 (×2): 20 mg via ORAL
  Filled 2014-01-27 (×4): qty 2

## 2014-01-27 MED ORDER — VITAMIN D (ERGOCALCIFEROL) 1.25 MG (50000 UNIT) PO CAPS: 50000.0000 [IU] | ORAL_CAPSULE | ORAL | Status: DC

## 2014-01-27 MED ORDER — COLCHICINE 0.6 MG PO TABS
0.6000 mg | ORAL_TABLET | Freq: Every day | ORAL | Status: DC
  Administered 2014-01-27 – 2014-01-28 (×2): 0.6 mg via ORAL
  Filled 2014-01-27 (×2): qty 1

## 2014-01-27 MED ORDER — FISH OIL 1200 MG PO CAPS: 1200.0000 | ORAL_CAPSULE | Freq: Every day | ORAL | Status: DC

## 2014-01-27 MED ORDER — OMEGA-3-ACID ETHYL ESTERS 1 G PO CAPS
1.0000 g | ORAL_CAPSULE | Freq: Every day | ORAL | Status: DC
  Administered 2014-01-28: 1 g via ORAL
  Filled 2014-01-27 (×2): qty 1

## 2014-01-27 MED ORDER — BUPIVACAINE-EPINEPHRINE (PF) 0.25% -1:200000 IJ SOLN
INTRAMUSCULAR | Status: AC
  Filled 2014-01-27: qty 30

## 2014-01-27 MED ORDER — PHENYLEPHRINE HCL 10 MG/ML IJ SOLN
INTRAMUSCULAR | Status: DC | PRN
  Administered 2014-01-27 (×2): 80 ug via INTRAVENOUS

## 2014-01-27 MED ORDER — ONDANSETRON HCL 4 MG PO TABS: 4.0000 mg | ORAL_TABLET | Freq: Four times a day (QID) | ORAL | Status: DC | PRN

## 2014-01-27 MED ORDER — MORPHINE SULFATE 4 MG/ML IJ SOLN
INTRAMUSCULAR | Status: AC
  Administered 2014-01-27: 2 mg
  Filled 2014-01-27: qty 1

## 2014-01-27 MED ORDER — CALCITRIOL 0.5 MCG PO CAPS
0.5000 ug | ORAL_CAPSULE | Freq: Every day | ORAL | Status: DC
  Administered 2014-01-27 – 2014-01-28 (×2): 0.5 ug via ORAL
  Filled 2014-01-27 (×2): qty 1

## 2014-01-27 MED ORDER — ETOMIDATE 2 MG/ML IV SOLN
INTRAVENOUS | Status: DC | PRN
  Administered 2014-01-27: 12 mg via INTRAVENOUS

## 2014-01-27 MED ORDER — LISINOPRIL 20 MG PO TABS
20.0000 mg | ORAL_TABLET | Freq: Every day | ORAL | Status: DC
  Administered 2014-01-27 – 2014-01-28 (×2): 20 mg via ORAL
  Filled 2014-01-27 (×2): qty 1

## 2014-01-27 MED ORDER — TORSEMIDE 20 MG PO TABS
20.0000 mg | ORAL_TABLET | Freq: Every day | ORAL | Status: DC
  Administered 2014-01-27 – 2014-01-28 (×2): 20 mg via ORAL
  Filled 2014-01-27 (×2): qty 1

## 2014-01-27 MED ORDER — OXYCODONE-ACETAMINOPHEN 5-325 MG PO TABS
1.0000 | ORAL_TABLET | ORAL | Status: DC | PRN
  Administered 2014-01-27: 2 via ORAL
  Filled 2014-01-27: qty 2

## 2014-01-27 MED ORDER — HYDROMORPHONE HCL PF 1 MG/ML IJ SOLN: 0.2500 mg | INTRAMUSCULAR | Status: DC | PRN

## 2014-01-27 MED ORDER — ALLOPURINOL 100 MG PO TABS
100.0000 mg | ORAL_TABLET | Freq: Every day | ORAL | Status: DC
  Administered 2014-01-28: 100 mg via ORAL
  Filled 2014-01-27: qty 1

## 2014-01-27 MED ORDER — GLYCOPYRROLATE 0.2 MG/ML IJ SOLN
INTRAMUSCULAR | Status: DC | PRN
  Administered 2014-01-27: 0.2 mg via INTRAVENOUS
  Administered 2014-01-27: .4 mg via INTRAVENOUS

## 2014-01-27 SURGICAL SUPPLY — 62 items
APPLIER CLIP ROT 10 11.4 M/L (STAPLE)
BLADE SURG ROTATE 9660 (MISCELLANEOUS) ×3 IMPLANT
CANISTER SUCTION 2500CC (MISCELLANEOUS) ×3 IMPLANT
CELLS DAT CNTRL 66122 CELL SVR (MISCELLANEOUS) IMPLANT
CHLORAPREP W/TINT 26ML (MISCELLANEOUS) ×3 IMPLANT
CLIP APPLIE ROT 10 11.4 M/L (STAPLE) IMPLANT
COVER SURGICAL LIGHT HANDLE (MISCELLANEOUS) ×3 IMPLANT
DECANTER SPIKE VIAL GLASS SM (MISCELLANEOUS) IMPLANT
DERMABOND ADVANCED (GAUZE/BANDAGES/DRESSINGS) ×1
DERMABOND ADVANCED .7 DNX12 (GAUZE/BANDAGES/DRESSINGS) ×2 IMPLANT
DRAPE UTILITY 15X26 W/TAPE STR (DRAPE) ×6 IMPLANT
DRAPE WARM FLUID 44X44 (DRAPE) ×3 IMPLANT
ELECT CAUTERY BLADE 6.4 (BLADE) ×6 IMPLANT
ELECT REM PT RETURN 9FT ADLT (ELECTROSURGICAL) ×3
ELECTRODE REM PT RTRN 9FT ADLT (ELECTROSURGICAL) ×2 IMPLANT
GAUZE SPONGE 4X4 12PLY STRL (GAUZE/BANDAGES/DRESSINGS) ×3 IMPLANT
GLOVE BIO SURGEON STRL SZ7 (GLOVE) ×6 IMPLANT
GLOVE BIO SURGEON STRL SZ8 (GLOVE) ×6 IMPLANT
GLOVE BIOGEL PI IND STRL 6.5 (GLOVE) ×4 IMPLANT
GLOVE BIOGEL PI IND STRL 7.0 (GLOVE) ×4 IMPLANT
GLOVE BIOGEL PI IND STRL 8 (GLOVE) ×2 IMPLANT
GLOVE BIOGEL PI INDICATOR 6.5 (GLOVE) ×2
GLOVE BIOGEL PI INDICATOR 7.0 (GLOVE) ×2
GLOVE BIOGEL PI INDICATOR 8 (GLOVE) ×1
GLOVE SS BIOGEL STRL SZ 6.5 (GLOVE) ×2 IMPLANT
GLOVE SUPERSENSE BIOGEL SZ 6.5 (GLOVE) ×1
GOWN STRL REUS W/ TWL LRG LVL3 (GOWN DISPOSABLE) ×6 IMPLANT
GOWN STRL REUS W/ TWL XL LVL3 (GOWN DISPOSABLE) ×2 IMPLANT
GOWN STRL REUS W/TWL LRG LVL3 (GOWN DISPOSABLE) ×3
GOWN STRL REUS W/TWL XL LVL3 (GOWN DISPOSABLE) ×1
KIT BASIN OR (CUSTOM PROCEDURE TRAY) ×3 IMPLANT
KIT ROOM TURNOVER OR (KITS) ×3 IMPLANT
LIGASURE IMPACT 36 18CM CVD LR (INSTRUMENTS) IMPLANT
NS IRRIG 1000ML POUR BTL (IV SOLUTION) ×6 IMPLANT
PAD ARMBOARD 7.5X6 YLW CONV (MISCELLANEOUS) ×6 IMPLANT
PENCIL BUTTON HOLSTER BLD 10FT (ELECTRODE) ×3 IMPLANT
RTRCTR WOUND ALEXIS 18CM MED (MISCELLANEOUS)
SCALPEL HARMONIC ACE (MISCELLANEOUS) IMPLANT
SCISSORS LAP 5X35 DISP (ENDOMECHANICALS) IMPLANT
SET IRRIG TUBING LAPAROSCOPIC (IRRIGATION / IRRIGATOR) IMPLANT
SLEEVE ENDOPATH XCEL 5M (ENDOMECHANICALS) ×6 IMPLANT
SPECIMEN JAR LARGE (MISCELLANEOUS) IMPLANT
STAPLER VISISTAT 35W (STAPLE) IMPLANT
SUT MNCRL AB 4-0 PS2 18 (SUTURE) ×3 IMPLANT
SUT VIC AB 2-0 BRD 54 (SUTURE) ×3 IMPLANT
SUT VIC AB 2-0 SH 18 (SUTURE) ×3 IMPLANT
SUT VIC AB 3-0 54X BRD REEL (SUTURE) ×2 IMPLANT
SUT VIC AB 3-0 BRD 54 (SUTURE) ×1
SUT VIC AB 3-0 SH 18 (SUTURE) ×3 IMPLANT
SYS LAPSCP GELPORT 120MM (MISCELLANEOUS)
SYSTEM LAPSCP GELPORT 120MM (MISCELLANEOUS) IMPLANT
TOWEL OR 17X24 6PK STRL BLUE (TOWEL DISPOSABLE) IMPLANT
TOWEL OR 17X26 10 PK STRL BLUE (TOWEL DISPOSABLE) ×3 IMPLANT
TRAY FOLEY CATH 14FRSI W/METER (CATHETERS) ×3 IMPLANT
TRAY LAPAROSCOPIC (CUSTOM PROCEDURE TRAY) ×3 IMPLANT
TROCAR XCEL BLUNT TIP 100MML (ENDOMECHANICALS) ×3 IMPLANT
TROCAR XCEL NON-BLD 11X100MML (ENDOMECHANICALS) IMPLANT
TROCAR XCEL NON-BLD 5MMX100MML (ENDOMECHANICALS) ×3 IMPLANT
TUBE CONNECTING 12X1/4 (SUCTIONS) ×3 IMPLANT
TUBING FILTER THERMOFLATOR (ELECTROSURGICAL) ×3 IMPLANT
WATER STERILE IRR 1000ML POUR (IV SOLUTION) IMPLANT
YANKAUER SUCT BULB TIP NO VENT (SUCTIONS) ×6 IMPLANT

## 2014-01-27 NOTE — OR Nursing (Signed)
Foley Catheter Insertion:  Patient uncircumcised, foreskin notably difficult to retract prior to foley insertion requiring assistance from surgeon to retract. Foreskin replaced after foley insertion.

## 2014-01-27 NOTE — Discharge Instructions (Signed)
Diagnostic Laparoscopy Laparoscopy is a surgical procedure. It is used to diagnose and treat diseases inside the belly (abdomen). It is usually a brief, common, and relatively simple procedure. The laparoscopeis a thin, lighted, pencil-sized instrument. It is like a telescope. It is inserted into your abdomen through a small cut (incision). Your caregiver can look at the organs inside your body through this instrument. He or she can see if there is anything abnormal. Laparoscopy can be done either in a hospital or outpatient clinic. You may be given a mild sedative to help you relax before the procedure. Once in the operating room, you will be given a drug to make you sleep (general anesthesia). Laparoscopy usually lasts less than 1 hour. After the procedure, you will be monitored in a recovery area until you are stable and doing well. Once you are home, it will take 2 to 3 days to fully recover. RISKS AND COMPLICATIONS  Laparoscopy has relatively few risks. Your caregiver will discuss the risks with you before the procedure. Some problems that can occur include:  Infection.  Bleeding.  Damage to other organs.  Anesthetic side effects. PROCEDURE Once you receive anesthesia, your surgeon inflates the abdomen with a harmless gas (carbon dioxide). This makes the organs easier to see. The laparoscope is inserted into the abdomen through a small incision. This allows your surgeon to see into the abdomen. Other small instruments are also inserted into the abdomen through other small openings. Many surgeons attach a video camera to the laparoscope to enlarge the view. During a diagnostic laparoscopy, the surgeon may be looking for inflammation, infection, or cancer. Your surgeon may take tissue samples(biopsies). The samples are sent to a specialist in looking at cells and tissue samples (pathologist). The pathologist examines them under a microscope. Biopsies can help to diagnose or confirm a  disease. AFTER THE PROCEDURE   The gas is released from inside the abdomen.  The incisions are closed with stitches (sutures). Because these incisions are small (usually less than 1/2 inch), there is usually minimal discomfort after the procedure. There may be some mild discomfort in the throat. This is from the tube placed in the throat while you were sleeping. You may have some mild abdominal discomfort. There may also be discomfort from the instrument placement incisions in the abdomen.  The recovery time is shortened as long as there are no complications.  You will rest in a recovery room until stable and doing well. As long as there are no complications, you may be allowed to go home. FINDING OUT THE RESULTS OF YOUR TEST Not all test results are available during your visit. If your test results are not back during the visit, make an appointment with your caregiver to find out the results. Do not assume everything is normal if you have not heard from your caregiver or the medical facility. It is important for you to follow up on all of your test results. HOME CARE INSTRUCTIONS   Take all medicines as directed.  Only take over-the-counter or prescription medicines for pain, discomfort, or fever as directed by your caregiver.  Resume daily activities as directed.  Showers are preferred over baths.  You may resume sexual activities in 1 week or as directed.  Do not drive while taking narcotics. SEEK MEDICAL CARE IF:   There is increasing abdominal pain.  There is new pain in the shoulders (shoulder strap areas).  You feel lightheaded or faint.  You have the chills.  You or  your child has an oral temperature above 102 F (38.9 C).  There is pus-like (purulent) drainage from any of the wounds.  You are unable to pass gas or have a bowel movement.  You feel sick to your stomach (nauseous) or throw up (vomit). MAKE SURE YOU:   Understand these instructions.  Will watch  your condition.  Will get help right away if you are not doing well or get worse. Document Released: 09/09/2000 Document Revised: 09/28/2012 Document Reviewed: 06/03/2007 [facility] Patient Information 2015 [place], [place]. This information is not intended to replace advice given to you by your health care provider. Make sure you discuss any questions you have with your health care provider.   CC[facility] ______[facility] LAPAROSCOPIC SURGERY: POST OP INSTRUCTIONS Always review your discharge instruction sheet given to you by the facility where your surgery was performed. IF YOU HAVE DISABILITY OR FAMILY LEAVE FORMS, YOU MUST BRING THEM TO THE OFFICE FOR PROCESSING.   DO NOT GIVE THEM TO YOUR DOCTOR.  1. A prescription for pain medication may be given to you upon discharge.  Take your pain medication as prescribed, if needed.  If narcotic pain medicine is not needed, then you may take acetaminophen (Tylenol) or ibuprofen (Advil) as needed. 2. Take your usually prescribed medications unless otherwise directed. 3. If you need a refill on your pain medication, please contact your pharmacy.  They will contact our office to request authorization. Prescriptions will not be filled after 5pm or on week-ends. 4. You should follow a light diet the first few days after arrival home, such as soup and crackers, etc.  Be sure to include lots of fluids daily. 5. Most patients will experience some swelling and bruising in the area of the incisions.  Ice packs will help.  Swelling and bruising can take several days to resolve.  6. It is common to experience some constipation if taking pain medication after surgery.  Increasing fluid intake and taking a stool softener (such as Colace) will usually help or prevent this problem from occurring.  A mild laxative (Milk of Magnesia or Miralax) should be taken according to package instructions if there are no bowel movements after 48 hours. 7. Unless discharge  instructions indicate otherwise, you may remove your bandages 24-48 hours after surgery, and you may shower at that time.  You may have steri-strips (small skin tapes) in place directly over the incision.  These strips should be left on the skin for 7-10 days.  If your surgeon used skin glue on the incision, you may shower in 24 hours.  The glue will flake off over the next 2-3 weeks.  Any sutures or staples will be removed at the office during your follow-up visit. 8. ACTIVITIES:  You may resume regular (light) daily activities beginning the next day--such as daily self-care, walking, climbing stairs--gradually increasing activities as tolerated.  You may have sexual intercourse when it is comfortable.  Refrain from any heavy lifting or straining until approved by your doctor. a. You may drive when you are no longer taking prescription pain medication, you can comfortably wear a seatbelt, and you can safely maneuver your car and apply brakes. b. RETURN TO WORK:  __________________________________________________________ 9. You should see your doctor in the office for a follow-up appointment approximately 2-3 weeks after your surgery.  Make sure that you call for this appointment within a day or two after you arrive home to insure a convenient appointment time. 10. OTHER INSTRUCTIONS: __________________________________________________________________________________________________________________________ __________________________________________________________________________________________________________________________ WHEN  TO CALL YOUR DOCTOR: 1. Fever over 101.0 2. Inability to urinate 3. Continued bleeding from incision. 4. Increased pain, redness, or drainage from the incision. 5. Increasing abdominal pain  The clinic staff is available to answer your questions during regular business hours.  Please dont hesitate to call and ask to speak to one of the nurses for clinical concerns.  If you  have a medical emergency, go to the nearest emergency room or call 911.  A surgeon from [facility] is always on call at the hospital. [address] [place], [place], [place]  [address] ? [address], [place], [place]   [address] [phone] ? [phone] ? FAX [phone] Web site: [email or web address]

## 2014-01-27 NOTE — Anesthesia Postprocedure Evaluation (Signed)
  Anesthesia Post-op Note  Patient: Tyler Hahn  Procedure(s) Performed: Procedure(s): LAPAROSCOPY DIAGNOSTIC (N/A)  Patient Location: PACU  Anesthesia Type:General  Level of Consciousness: awake  Airway and Oxygen Therapy: Patient Spontanous Breathing  Post-op Pain: mild  Post-op Assessment: Post-op Vital signs reviewed  Post-op Vital Signs: Reviewed  Last Vitals:  Filed Vitals:   01/27/14 0945  BP: 147/60  Pulse: 60  Temp: 36.2 C  Resp: 12    Complications: No apparent anesthesia complications

## 2014-01-27 NOTE — H&P (Signed)
Transplants    None    Demographics Tyler Hahn 73 year old male  [place] [address] [phone] (M)  Comm Pref:   [place] [id number] [phone]) Works at [employer] [RETIRED  Problem ListHospitalization ProblemNon-Hospital  DM (diabetes mellitus) type II controlled with renal manifestation  HYPERLIPIDEMIA  GOUT  HYPERTENSION, BENIGN ESSENTIAL  RENAL INSUFFICIENCY  TESTICULAR MASS  CARDIOMYOPATHY, SECONDARY  SYSTOLIC HEART FAILURE, CHRONIC  IMPLANTABLE CARDIAC DEFIBRILLATOR -[employer]-SINGLE  Right elbow pain  Ulnar nerve neuropathy  Loss of weight  Polyarthralgia  Loss of appetite  Hyperthyroidism  Myalgia  Abdominal pain, left upper quadrant  Small bowel mass  Significant History/Details  Smoking: Former Smoker (Quit Date:11/26/1998)  Smokeless Tobacco: Never Used  Alcohol: No  3 open orders  Preferred Language: English  Dialysis HistoryNone   Currently admitted as of 8/13/2015Specialty CommentsEditShow AllReport07/06/15:PT SIGNED PHI FOR JONATHAN Cammon SON,CAOR Wassmer WIFE 12/21/13 no bowel prep needed per TC mb 12-21-13 pt [employer] mcr, no deductible, OOP 3950.00/647.73 meet, 0% co-ins. tvp DOS 01/27/14 [facility]-OP- lap small bowel resction/gen/et [id number] 12-28-13 pt op sx schd [id number] @ [facility] no precert required per [place] site. tvp   MedicationsHospital Medications Outpatient Medications  cefOXitin (MEFOXIN) 2 g in dextrose 5 % 50 mL IVPB  chlorhexidine (HIBICLENS) 4 % liquid 1 application  lactated ringers infusion    Preferred Labs   None   Transplant-Related Biopsies (11 years) ** None **  Patient Blood Type (50 years)   None                                 Recent Visits (Maximum of 10 visits)Date Type Provider Description  01/27/2014 Surgery Mahira Petras A., MD   12/20/2013 Office Visit Turner Daniels., MD Small Bowel Mass (Primary Dx)         My Last Outpatient Progress NoteStatus Last Edited Encounter Date  Signed Mon Dec 20, 2013 1:48 PM EDT 12/20/2013  Patient ID: Tyler Hahn, male   DOB: 1941/02/10, 72 y.o.   MRN: [id number]    Chief Complaint   Patient presents with   .  eval small bowle mass      HPI Tyler Hahn is a 73 y.o. male.   HPI Patient sent at request of Dr. Silvio Pate for mass involving proximal jejunum on CT scan. He has lost 100 pounds over the last year has lack of appetite. Upper endoscopy normal. This area was detected in January of 2015. No blood in the stool. No history of anemia. Mild epigastric abdominal pain which is intermittent. Occasional nausea. No vomiting.   Past Medical History   Diagnosis  Date   .  Hypertension     .  Diabetes mellitus     .  Hyperlipidemia     .  Hyperthyroidism     .  Gout     .  Renal insufficiency     .  PONV (postoperative nausea and vomiting)     .  CHF (congestive heart failure)         2000       Past Surgical History   Procedure  Laterality  Date   .  Cardiac defibrillator placement       .  Icd,[employer]       .  Polyp removal throat  difficulty speaking   .  Enteroscopy  N/A  11/25/2013       Procedure: ENTEROSCOPY;  Surgeon: Ladene Artist, MD;  Location: [facility];  Service: Endoscopy;  Laterality: N/A;       Family History   Problem  Relation  Age of Onset   .  Stomach cancer  Mother     .  Diabetes  Father     .  Heart disease  Father     .  Liver disease  Father     .  Kidney disease  Father        Social History History   Substance Use Topics   .  Smoking status:  Former Smoker       Types:  Cigarettes       Quit date:  11/26/1998   .  Smokeless tobacco:  Never Used   .  Alcohol Use:  No       Allergies   Allergen  Reactions   .  Sulfonamide Derivatives         REACTION: itching       Current Outpatient Prescriptions   Medication  Sig  Dispense  Refill   .  allopurinol (ZYLOPRIM) 100 MG tablet   TAKE ONE & ONE-HALF TABLETS BY MOUTH EVERY DAY   45 tablet   5   .  atorvastatin (LIPITOR) 20 MG tablet  TAKE ONE TABLET BY MOUTH ONCE DAILY   30 tablet   5   .  calcitRIOL (ROCALTROL) 0.5 MCG capsule  Take 0.5 mcg by mouth daily.         .  carvedilol (COREG) 6.25 MG tablet  Take 6.25 mg by mouth 2 (two) times daily with a meal.         .  colchicine (COLCRYS) 0.6 MG tablet  Take 1 tablet (0.6 mg total) by mouth daily.   30 tablet   5   .  diclofenac sodium (VOLTAREN) 1 % GEL  Apply 4 g topically 4 (four) times daily.   100 g   3   .  erythromycin ophthalmic ointment  1 application at bedtime.         Marland Kitchen  lisinopril (PRINIVIL,ZESTRIL) 20 MG tablet  TAKE ONE TABLET BY MOUTH ONCE DAILY   30 tablet   5   .  methimazole (TAPAZOLE) 10 MG tablet  Take 2 tablets (20 mg total) by mouth 2 (two) times daily.   120 tablet   0   .  Omega-3 Fatty Acids (FISH OIL) 1200 MG CAPS  Take by mouth daily.           Marland Kitchen  omeprazole (PRILOSEC) 40 MG capsule  Take 1 capsule (40 mg total) by mouth daily.   30 capsule   11   .  propylthiouracil (PTU) 50 MG tablet  4 tabs, twice a day   240 tablet   2   .  torsemide (DEMADEX) 20 MG tablet  TAKE ONE TABLET BY MOUTH ONCE DAILY   30 tablet   5   .  Vitamin D, Ergocalciferol, (DRISDOL) 50000 UNITS CAPS capsule  Take 1 capsule (50,000 Units total) by mouth every 7 (seven) days.   12 capsule   1   .  [DISCONTINUED] atenolol (TENORMIN) 50 MG tablet  Take 75 mg by mouth daily.           .  [DISCONTINUED] Insulin Glargine (LANTUS SOLOSTAR )  Inject into the skin as directed.  No current facility-administered medications for this visit.      Review of Systems Review of Systems  Constitutional: Positive for unexpected weight change. Negative for fever and chills.  HENT: Negative for congestion, hearing loss, sore throat, trouble swallowing and voice change.   Eyes: Negative for visual disturbance.  Respiratory: Negative for cough and wheezing.   Cardiovascular:  Negative for chest pain, palpitations and leg swelling.  Gastrointestinal: Positive for abdominal pain. Negative for nausea, vomiting, diarrhea, constipation, blood in stool, abdominal distention, anal bleeding and rectal pain.  Genitourinary: Negative for hematuria and difficulty urinating.  Musculoskeletal: Negative for arthralgias.  Skin: Negative for rash and wound.  Neurological: Negative for seizures, syncope, weakness and headaches.  Hematological: Negative for adenopathy. Does not bruise/bleed easily.  Psychiatric/Behavioral: Negative for confusion.    Blood pressure 122/78, pulse 63, temperature 98.1 F (36.7 C), height 5\' 6"  (1.676 m), weight 162 lb (73.483 kg).   Physical Exam Physical Exam  Constitutional: He is oriented to person, place, and time. He appears well-developed and well-nourished.  HENT:   Head: Normocephalic.   Mouth/Throat: No oropharyngeal exudate.  Eyes: Pupils are equal, round, and reactive to light. No scleral icterus.  Neck: Normal range of motion.  Cardiovascular: Normal rate and regular rhythm.   Pulmonary/Chest: Effort normal and breath sounds normal.  Abdominal: He exhibits no distension and no mass. There is no tenderness. There is no rebound and no guarding.  Musculoskeletal: Normal range of motion.  Lymphadenopathy:    He has no cervical adenopathy.  Neurological: He is alert and oriented to person, place, and time.  Skin: Skin is warm and dry.  Psychiatric: He has a normal mood and affect. His behavior is normal. Judgment and thought content normal.    Data Reviewed CLINICAL DATA: Epigastric pain and slight nausea for a few months   with 50 lb weight loss   EXAM:   CT ABDOMEN AND PELVIS WITHOUT CONTRAST   TECHNIQUE:   Multidetector CT imaging of the abdomen and pelvis was performed   following the standard protocol without intravenous contrast.   COMPARISON: None.   FINDINGS:   Liver and gallbladder are normal. Spleen is normal.  Pancreas is   normal. Adrenal glands are normal. There are numerous bilateral   round renal lesions. These appear generally of low attenuation and   may represent cysts ranging in size from 1-3 cm.   There is atherosclerotic calcification of the aorta and iliac   vessels. There is calcification of the celiac and superior   mesenteric arteries. 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 Iliac arteries are also dilated 18 mm on the right and 14   mm on the left.   Bladder is normal. Reproductive organs are normal.   There is mild diverticulosis of the sigmoid colon without evidence   of diverticulitis. There is no significant adenopathy or free fluid.   There is an abnormal loop of proximal jejunum in the left upper   quadrant. This is best seen on images 30 through 35 series 2. This   is also seen well on the coronal series. There is a 5 cm long loop   of proximal jejunum showing moderate to severe wall thickening and   mild surrounding inflammatory change. There is retractile change in   the mesentery of this loop of bowel with inflammatory change   extending into the mesenteric a as well.   Visualized portions of the lung bases are  clear. There are no acute   musculoskeletal findings.   IMPRESSION:   Abnormal loop of proximal jejunum with wall thickening,   inflammation, and retraction of the mesenteric. Malignancy involving   this loop of bowel is a consideration. Inflammatory change from   infectious or inflammatory enteritis, as well as the possibility of   ischemic stricture, could all have this appearance as well.   Electronically Signed   By: Skipper Cliche M.D.   On: 07/16/2013 16:17     Assessment Small bowel mass proximal  jejunum     possible stricture versus malignancy Patient Active Problem List     Diagnosis  Date Noted   .  Small bowel mass  12/20/2013   .  Abdominal pain, left upper quadrant  11/25/2013   .  Myalgia  07/07/2013   .   Hyperthyroidism  05/06/2013   .  Loss of weight  04/19/2013   .  Polyarthralgia  04/19/2013   .  Loss of appetite  04/19/2013   .  Ulnar nerve neuropathy  08/25/2012   .  Right elbow pain  08/17/2012   .  CARDIOMYOPATHY, SECONDARY  06/07/2010   .  SYSTOLIC HEART FAILURE, CHRONIC  06/07/2010   .  RENAL INSUFFICIENCY  04/04/2010   .  TESTICULAR MASS  04/04/2010   .  DM (diabetes mellitus) type II controlled with renal manifestation  03/07/2010   .  HYPERLIPIDEMIA  03/07/2010   .  GOUT  03/07/2010   .  HYPERTENSION, BENIGN ESSENTIAL  03/07/2010   .  IMPLANTABLE CARDIAC DEFIBRILLATOR -[employer]-SINGLE  03/07/2010      Plan Discussed options although laparoscopy, laparotomy and resection versus deep endoscopy for further evaluation. His last a significant amount of weight without trying and has loss of appetite. Concern is for malignancy and this  best determined laparoscopy and resection if needed. He does have multiple medical problems. He will need cardiac clearance prior to any surgical intervention.The procedure has been discussed with the patient.  Alternative therapies have been discussed with the patient.  Operative risks include bleeding,  Infection, anastomosis leak,   Organ injury,  Nerve injury,  Blood vessel injury,  DVT,  Pulmonary embolism,  Death,  And possible reoperation.  Medical management risks include worsening of present situation.  The success of the procedure is 50 -90 % at treating patients symptoms.  The patient understands and agrees to proceed.       Liese Dizdarevic A.

## 2014-01-27 NOTE — Op Note (Signed)
NAMEHERSHEY, CURCIO               ACCOUNT NO.:  [id number]  MEDICAL RECORD NO.:  [id number]  LOCATION:  [facility]                         FACILITY:  [facility]  PHYSICIAN:  Marcello Moores A. 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  POSTOPERATIVE DIAGNOSIS:  Normal laparoscopy.  PROCEDURE:  Diagnostic laparoscopy.  SURGEON:  Marcello Moores A. Ismeal Heider, M.D.  ANESTHESIA:  General endotracheal anesthesia.  ESTIMATED BLOOD LOSS:  Minimal.  SPECIMENS:  None.  IV FLUIDS:  300 mL of crystalloid.  DRAINS:  None.  INDICATIONS FOR PROCEDURE:  The patient is a 73 year old male referred by Dr. Lucio Edward for a 33-month history of progressive weight loss and epigastric abdominal pain.  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  He underwent an upper endoscopy with Dr. Fuller Plan in June for progression of his weight loss and was able to advance the scope to 70 cm without abnormality.  He was referred to me for possibility of a laparoscopy and possible resection of this area that was found on CT scanning.  He has a lot of chronic medical problems and has chronic mild- to-moderate epigastric pain after eating.  He has no history of gallstones, either abnormality on his abdominal CT scan to explain his findings.  I discussed the possibility of him requiring a extended endoscopy at a [facility] of proceeding laparoscopy given his symptoms and weight loss.  The family and the patient wished to proceed with laparoscopy with possible resection of this area.  Risks were discussed and are outlined in my history and physical.  I  discussed the procedure in the holding area with the patient's son and they agreed to proceed.  DESCRIPTION OF PROCEDURE:  The patient was taken back to the operating room and placed supine on the OR table.  He was then seen by Anesthesia, where general anesthesia was initiated.  An A-line was placed.  We placed a Foley catheter which was done under sterile conditions. Abdomen was then prepped and draped in sterile fashion.  A time-out was done and procedure was verified as laparoscopy with possible small bowel resection.  A small left lower quadrant incision was made and a 5-mm Optiview port was placed.  We tried into the abdominal cavity with this, but due to the flexibility of the abdominal cavity, I could not easily do that.  I elected to do a cutdown at the umbilicus.  A 1 cm incision was made just below the umbilicus.  Dissection was carried down the fascia and the fascia was opened with a scalpel.  The Kocher's were used and I was able to use a Kelly to open the peritoneal lining without difficulty.  No evidence of injury to this.  Pursestring suture of 0 Vicryl was placed.  A 12 mm Hasson was placed through this port site. Laparoscopy was then  begun with insufflation of the abdominal cavity with 50 mmHg of CO2.  We placed a 10/30 degree scope.  I then placed two 5 mm ports in the left lower and left upper quadrant.  Gross inspection was normal.  Liver was normal.  Gallbladder was normal.  Stomach was normal.  We then used a nontraumatic graspers, put the patient head up and retracted the transverse colon cephalad.  The ligament of Treitz was identified.  Small bowel was normal there.  I then ran the small bowel from the ligament of Treitz all the way down through the ileocecal valve.  No evidence of stricture, mass, or other abnormality.  The small intestine appeared soft and pliable without mass or stricture or retraction of the mesentery.  Appendix was normal.  Cecum was  normal. Ascending, transverse, descending, and sigmoid colon to the rectum were grossly normal.  Put the patient in head-down position to examine the pelvic viscera with similar findings.  The small bowel was then run from ligament of Treitz up through the ligament of Treitz and this was again normal with no obvious abnormality.  I saw no evidence of carcinomatosis or intra-abdominal mass or any other abnormality.  I was able to visualize a 3 cm abdominal aortic aneurysm with peritoneum and no signs of any lymphadenopathy.  After extensive searching, I did not find a cause for his abdominal pain nor his weight loss.  He does have multiple medical problems, certainly this could be contributing to that.  At this point, we terminated the case by doing a 4 quadrant laparoscopy to make sure there was no evidence of any injury to internal viscera which were not that I could see.  We then removed our ports allowing the CO2 to escape closing the fascia at the umbilical port site.  A 4-0 Monocryl was used to close the 3 port sites.  Skin incisions and dry dressings applied.  Foley was removed.  All final counts of sponge, needle, and instruments found to be correct at this portion of the case.  The patient was awake and alert.  The patient was taken in satisfactory condition and extubated to the PACU.     Abryana Lykens A. Loralei Radcliffe, M.D.     TAC/MEDQ  D:  01/27/2014  T:  01/27/2014  Job:  [id number]

## 2014-01-27 NOTE — Interval H&P Note (Signed)
History and Physical Interval Note:  01/27/2014 7:17 AM  Tyler Hahn  has presented today for surgery, with the diagnosis of small bowel mass  The various methods of treatment have been discussed with the patient and family. After consideration of risks, benefits and other options for treatment, the patient has consented to  Procedure(s): LAPAROSCOPIC SMALL BOWEL RESECTION POSSIBLE OPEN (N/A) as a surgical intervention .  The patient's history has been reviewed, patient examined, no change in status, stable for surgery.  I have reviewed the patient's chart and labs.  Questions were answered to the patient's satisfaction.     Charon Smedberg A.

## 2014-01-27 NOTE — Brief Op Note (Signed)
01/27/2014  8:59 AM  PATIENT:  Tyler Hahn  73 y.o. 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 Finneus Kaneshiro, MD - Primary     ASSISTANTS: none   ANESTHESIA:   general and 0.25 % sensoricaine with epinephrine  EBL:  Total I/O In: 500 [I.V.:500] Out: 300 [Urine:300]  BLOOD ADMINISTERED:none  DRAINS: none    SPECIMEN:  No Specimen  DISPOSITION OF SPECIMEN:  N/A  COUNTS:  YES  TOURNIQUET:  * No tourniquets in log *  DICTATION: .Other Dictation: Dictation Number [id number]  PLAN OF CARE: Admit for overnight observation  PATIENT DISPOSITION:  PACU - hemodynamically stable.   Delay start of Pharmacological VTE agent (>24hrs) due to surgical blood loss or risk of bleeding: no

## 2014-01-27 NOTE — Transfer of Care (Signed)
Immediate Anesthesia Transfer of Care Note  Patient: Tyler Hahn  Procedure(s) Performed: Procedure(s): LAPAROSCOPY DIAGNOSTIC (N/A)  Patient Location: PACU  Anesthesia Type:General  Level of Consciousness: awake and alert   Airway & Oxygen Therapy: Patient Spontanous Breathing and Patient connected to nasal cannula oxygen  Post-op Assessment: Report given to PACU RN, Post -op Vital signs reviewed and stable and Patient moving all extremities  Post vital signs: Reviewed and stable  Complications: No apparent anesthesia complications

## 2014-01-27 NOTE — Anesthesia Preprocedure Evaluation (Addendum)
Anesthesia Evaluation  Patient identified by MRN, date of birth, ID band Patient awake    Reviewed: Allergy & Precautions, H&P , NPO status , Patient's Chart, lab work & pertinent test results  Airway Mallampati: II      Dental   Pulmonary former smoker,  breath sounds clear to auscultation        Cardiovascular hypertension, +CHF + Cardiac Defibrillator Rhythm:Regular     Neuro/Psych    GI/Hepatic Neg liver ROS, History noted. CE   Endo/Other  diabetes  Renal/GU Renal disease     Musculoskeletal   Abdominal   Peds  Hematology   Anesthesia Other Findings   Reproductive/Obstetrics                          Anesthesia Physical Anesthesia Plan  ASA: IV  Anesthesia Plan: General   Post-op Pain Management:    Induction: Intravenous  Airway Management Planned: Oral ETT  Additional Equipment:   Intra-op Plan:   Post-operative Plan: Possible Post-op intubation/ventilation  Informed Consent: I have reviewed the patients History and Physical, chart, labs and discussed the procedure including the risks, benefits and alternatives for the proposed anesthesia with the patient or authorized representative who has indicated his/her understanding and acceptance.   Dental advisory given  Plan Discussed with: CRNA, Anesthesiologist and Surgeon  Anesthesia Plan Comments:        Anesthesia Quick Evaluation

## 2014-01-28 ENCOUNTER — Encounter (HOSPITAL_COMMUNITY): Payer: Self-pay | Admitting: Surgery

## 2014-01-28 DIAGNOSIS — R634 Abnormal weight loss: Secondary | ICD-10-CM | POA: Diagnosis not present

## 2014-01-28 LAB — CBC
HCT: 25.9 % — ABNORMAL LOW (ref 39.0–52.0)
Hemoglobin: 8.4 g/dL — ABNORMAL LOW (ref 13.0–17.0)
MCH: 26.8 pg (ref 26.0–34.0)
MCHC: 32.4 g/dL (ref 30.0–36.0)
MCV: 82.5 fL (ref 78.0–100.0)
Platelets: 105 K/uL — ABNORMAL LOW (ref 150–400)
RBC: 3.14 MIL/uL — ABNORMAL LOW (ref 4.22–5.81)
RDW: 14.1 % (ref 11.5–15.5)
WBC: 7 K/uL (ref 4.0–10.5)

## 2014-01-28 LAB — GLUCOSE, CAPILLARY
GLUCOSE-CAPILLARY: 87 mg/dL (ref 70–99)
Glucose-Capillary: 99 mg/dL (ref 70–99)

## 2014-01-28 LAB — BASIC METABOLIC PANEL WITH GFR
Anion gap: 12 (ref 5–15)
BUN: 66 mg/dL — ABNORMAL HIGH (ref 6–23)
CO2: 19 meq/L (ref 19–32)
Calcium: 8.8 mg/dL (ref 8.4–10.5)
Chloride: 109 meq/L (ref 96–112)
Creatinine, Ser: 2.07 mg/dL — ABNORMAL HIGH (ref 0.50–1.35)
GFR calc Af Amer: 35 mL/min — ABNORMAL LOW
GFR calc non Af Amer: 31 mL/min — ABNORMAL LOW
Glucose, Bld: 114 mg/dL — ABNORMAL HIGH (ref 70–99)
Potassium: 5.4 meq/L — ABNORMAL HIGH (ref 3.7–5.3)
Sodium: 140 meq/L (ref 137–147)

## 2014-01-28 MED ORDER — OXYCODONE-ACETAMINOPHEN 5-325 MG PO TABS: 1.0000 | ORAL_TABLET | Freq: Four times a day (QID) | ORAL | Status: DC | PRN

## 2014-01-28 NOTE — Progress Notes (Signed)
UR Completed Zigmund Linse Graves-Bigelow, RN,BSN [phone]  

## 2014-01-28 NOTE — Progress Notes (Signed)
Discharge paperwork given to patient. Prescriptions given to patient. Advised patient to call for follow up appointment asap. Patient is ready for discharge.

## 2014-01-28 NOTE — Progress Notes (Signed)
Agree with above, looks ready for dc

## 2014-01-28 NOTE — Progress Notes (Signed)
Chaplain met with family members and patient to agree upon a time to discuss the directives questionnaire. There is some indication this has already been done at  another hospital?  Tyler Hahn

## 2014-01-28 NOTE — Progress Notes (Signed)
Patient is tolerating well with his soft diet.  Denies any nausea or pain.

## 2014-01-28 NOTE — Progress Notes (Signed)
[facility] Progress Note  1 Day Post-Op  Subjective: Pt feels fine, no significant pain.  No N/V.  Tolerating full liquids.  Having flatus.  Ambulating well.  Wants to go home.  Will stay with son.  Objective: Vital signs in last 24 hours: Temp:  [97.2 F (36.2 C)-98.1 F (36.7 C)] 97.9 F (36.6 C) (08/14 0526) Pulse Rate:  [60-73] 73 (08/14 0526) Resp:  [10-18] 18 (08/14 0526) BP: (111-175)/(39-77) 111/57 mmHg (08/14 0526) SpO2:  [97 %-100 %] 100 % (08/14 0526) Arterial Line BP: (160-189)/(59-66) 160/59 mmHg (08/13 0930) Last BM Date: 01/26/14  Intake/Output from previous day: 08/13 0701 - 08/14 0700 In: 2504.2 [P.O.:720; I.V.:1784.2] Out: 2775 [Urine:2725; Blood:50] Intake/Output this shift: Total I/O In: -  Out: 200 [Urine:200]  PE: Gen:  Alert, NAD, pleasant Abd: Soft, minimal tenderness, ND, +BS, no HSM, incisions C/D/I with dermabond in place   Lab Results:   Recent Labs  01/27/14 0722 01/28/14 0500  WBC  --  7.0  HGB 10.2* 8.4*  HCT 30.0* 25.9*  PLT  --  105*   BMET  Recent Labs  01/27/14 0722 01/27/14 1225 01/28/14 0500  NA 141  --  140  K 5.3  --  5.4*  CL  --   --  109  CO2  --   --  19  GLUCOSE 82  --  114*  BUN  --   --  66*  CREATININE  --  2.07* 2.07*  CALCIUM  --   --  8.8   PT/INR No results found for this basename: LABPROT, INR,  in the last 72 hours CMP     Component Value Date/Time   NA 140 01/28/2014 0500   K 5.4* 01/28/2014 0500   CL 109 01/28/2014 0500   CO2 19 01/28/2014 0500   GLUCOSE 114* 01/28/2014 0500   GLUCOSE 81 07/06/2010   BUN 66* 01/28/2014 0500   CREATININE 2.07* 01/28/2014 0500   CALCIUM 8.8 01/28/2014 0500   PROT 8.0 01/19/2014 1359   ALBUMIN 3.9 01/19/2014 1359   AST 17 01/19/2014 1359   ALT 21 01/19/2014 1359   ALKPHOS 91 01/19/2014 1359   BILITOT <0.2* 01/19/2014 1359   GFRNONAA 31* 01/28/2014 0500   GFRAA 35* 01/28/2014 0500   Lipase  No results found for this basename: lipase        Studies/Results: No results found.  Anti-infectives: Anti-infectives   Start     Dose/Rate Route Frequency Ordered Stop   01/27/14 0600  cefOXitin (MEFOXIN) 2 g in dextrose 5 % 50 mL IVPB     2 g 100 mL/hr over 30 Minutes Intravenous On call to O.R. 01/26/14 1446 01/27/14 0759       Assessment/Plan Concern for possible stricture on proximal jejunum on CT and abdominal pain POD #1 s/p normal diagnostic laparoscopy Chronic kidney disease - Cr. 2.07  Plan: 1.  Advance diet to soft, plan for d/c today after diet tolerated 2.  Ambulate and IS 3.  SCD's and lovneox 4.  Follow up with Dr. Brantley Stage in 2-3 weeks 5.  Hgb/Hct down a bit likely dilutional and patient is asymptomatic 6.  Cr. Is actually improved compared to the last few weeks.    LOS: 1 day    Tyler Hahn 01/28/2014, 8:52 AM Pager: [phone]

## 2014-02-05 NOTE — Discharge Summary (Signed)
Physician Discharge Summary  Patient ID: Tyler Hahn MRN: [id number] DOB/AGE: 1941-03-17 74 y.o.  Admit date: 01/27/2014 Discharge date: 02/05/2014  Admission Diagnoses:abdominal pain  Discharge Diagnoses:  Active Problems:   Abdominal pain of unknown cause   Discharged Condition: good  Hospital Course: unremarkable.  Went home POD 1 WITHOUT ANY COMPLICATIONS AT THAT POINT.   Consults: None  Significant Diagnostic Studies: NONE  Treatments: surgery: LAPAROSCOPY  Discharge Exam: Blood pressure 132/57, pulse 61, temperature 97.9 F (36.6 C), temperature source Oral, resp. rate 18, weight 162 lb (73.483 kg), SpO2 99.00%. Incision/Wound:SOFT NT SITES CLEAN  Disposition: 01-Home or Self Care     Medication List    STOP taking these medications       colchicine 0.6 MG tablet      TAKE these medications       allopurinol 100 MG tablet  Commonly known as:  ZYLOPRIM  Take 100 mg by mouth daily.     atorvastatin 20 MG tablet  Commonly known as:  LIPITOR  Take 20 mg by mouth daily.     calcitRIOL 0.5 MCG capsule  Commonly known as:  ROCALTROL  Take 0.5 mcg by mouth daily.     carvedilol 6.25 MG tablet  Commonly known as:  COREG  Take 6.25 mg by mouth 2 (two) times daily with a meal.     erythromycin ophthalmic ointment  Place 1 application into both eyes at bedtime.     lisinopril 20 MG tablet  Commonly known as:  PRINIVIL,ZESTRIL  Take 20 mg by mouth daily.     methimazole 10 MG tablet  Commonly known as:  TAPAZOLE  Take 20 mg by mouth 2 (two) times daily.     Omega 3 1200 MG Caps  Take 1,200 mg by mouth daily.     omeprazole 40 MG capsule  Commonly known as:  PRILOSEC  Take 40 mg by mouth daily.     oxyCODONE-acetaminophen 5-325 MG per tablet  Commonly known as:  PERCOCET/ROXICET  Take 1-2 tablets by mouth every 6 (six) hours as needed for severe pain.     torsemide 20 MG tablet  Commonly known as:  DEMADEX  Take 20 mg by mouth daily.     Vitamin D (Ergocalciferol) 50000 UNITS Caps capsule  Commonly known as:  DRISDOL  Take 50,000 Units by mouth every 7 (seven) days. Take 50,000 units by mouth on Sunday           Follow-up Information   Follow up with Delinda Malan A., MD In 3 weeks. (For post-operation check)    Specialty:  General Surgery   Contact information:   [address] [place] [address] [phone]       Signed: Turner Daniels. 02/05/2014, 7:18 AM

## 2014-02-23 ENCOUNTER — Telehealth: Payer: Self-pay | Admitting: Gastroenterology

## 2014-02-23 NOTE — Telephone Encounter (Signed)
Patient recently had surgery with Dr. Brantley Stage.  He was to follow up with him in 3 weeks according to the discharge instructions.  I have given him the number to [facility]

## 2014-02-24 ENCOUNTER — Encounter: Payer: Medicare HMO | Admitting: Internal Medicine

## 2014-02-28 ENCOUNTER — Other Ambulatory Visit: Payer: Self-pay | Admitting: General Practice

## 2014-02-28 NOTE — Telephone Encounter (Signed)
Pt is overdue for appt.  No refills until pt seen in office so labs can be done

## 2014-02-28 NOTE — Telephone Encounter (Signed)
Please advise, last Vitamin D was 07-07-13 at that time pt level was 10. Pt has been on vitamin D since then with no repeat labs.

## 2014-03-01 NOTE — Telephone Encounter (Signed)
Called and tried to notify pt, no answer on phone. Letter mailed also.

## 2014-03-16 ENCOUNTER — Other Ambulatory Visit: Payer: Self-pay | Admitting: Endocrinology

## 2014-03-24 ENCOUNTER — Ambulatory Visit (INDEPENDENT_AMBULATORY_CARE_PROVIDER_SITE_OTHER): Payer: Medicare HMO | Admitting: Family Medicine

## 2014-03-24 ENCOUNTER — Encounter: Payer: Self-pay | Admitting: Family Medicine

## 2014-03-24 VITALS — BP 130/86 | HR 72 | Temp 98.1°F | Resp 16 | Wt 177.2 lb

## 2014-03-24 DIAGNOSIS — E785 Hyperlipidemia, unspecified: Secondary | ICD-10-CM

## 2014-03-24 DIAGNOSIS — F4321 Adjustment disorder with depressed mood: Secondary | ICD-10-CM | POA: Insufficient documentation

## 2014-03-24 DIAGNOSIS — Z79899 Other long term (current) drug therapy: Secondary | ICD-10-CM

## 2014-03-24 DIAGNOSIS — N058 Unspecified nephritic syndrome with other morphologic changes: Secondary | ICD-10-CM

## 2014-03-24 DIAGNOSIS — E1129 Type 2 diabetes mellitus with other diabetic kidney complication: Secondary | ICD-10-CM

## 2014-03-24 DIAGNOSIS — Z23 Encounter for immunization: Secondary | ICD-10-CM

## 2014-03-24 MED ORDER — SERTRALINE HCL 25 MG PO TABS: 25.0000 mg | ORAL_TABLET | Freq: Every day | ORAL | Status: DC

## 2014-03-24 NOTE — Progress Notes (Signed)
   Subjective:    Patient ID: Tyler Hahn, male    DOB: 1941/03/30, 73 y.o.   MRN: [id number]  HPI Anxiety- pt had recent surgery for small bowel mass.  Wife is now at [facility].  Pt is having hard time accepting wife requires more care than he can provide.  'i've never been by myself'.  Pt reports, 'I'm going down the road to a dark place.  I don't want to go there'.  No hx of depression.  Son lives locally and is very involved w/ both parents.    DM- pt admits he has not seen anyone recently b/c he has been so invested in providing his wife care.  Denies CP, SOB, HAs, visual changes, edema.   Review of Systems For ROS see HPI     Objective:   Physical Exam  Vitals reviewed. Constitutional: He is oriented to person, place, and time. He appears well-developed and well-nourished. No distress.  Hunched, elderly man  HENT:  Head: Normocephalic and atraumatic.  Cardiovascular: Normal rate, regular rhythm and intact distal pulses.   Pulmonary/Chest: Effort normal and breath sounds normal. No respiratory distress. He has no wheezes. He has no rales.  Musculoskeletal: He exhibits no edema.  Neurological: He is alert and oriented to person, place, and time.  Psychiatric:  Fighting tears when talking about wife Withdrawn          Assessment & Plan:

## 2014-03-24 NOTE — Assessment & Plan Note (Signed)
Chronic problem.  Pt has not been compliant w/ f/u due to his round the clock care for his wife.  Will get all labs today as a baseline and adjust his chronic medical problems at future visits.  It did not seem appropriate to discuss fully today as pt was so upset.

## 2014-03-24 NOTE — Assessment & Plan Note (Signed)
New.  Pt is really struggling w/ placing wife in nursing facility.  Having hard time being alone.  Feels he has failed her.  Discussed that he has done everything in his power to care for her- even ignoring his own health.  Son (who is present) and other children agree that he is deteriorating under the strain of her care and that this is best for both of them.  Encouraged him to come up w/ daily schedule so that he doesn't feel that his day is empty.  Will start low dose Zoloft as pt feels he's 'headed to a dark place'.  Pt able to contract for safety.  Will follow closely.  Total time spent w/ pt, 32 minutes and >50% spent counseling.

## 2014-03-24 NOTE — Progress Notes (Signed)
Pre visit review using our clinic review tool, if applicable. No additional management support is needed unless otherwise documented below in the visit note. 

## 2014-03-24 NOTE — Patient Instructions (Signed)
Follow up in 2 weeks to recheck mood Start the Zoloft daily for mood Consider speaking to someone about your feelings- let me know if you need recommendations We'll notify you of your lab results and make any changes if needed We have to start taking care of you! Call with any questions or concerns Hang in there!

## 2014-03-24 NOTE — Assessment & Plan Note (Signed)
Chronic problem.  Check labs.  Adjust meds prn  

## 2014-03-25 ENCOUNTER — Encounter: Payer: Self-pay | Admitting: General Practice

## 2014-03-25 LAB — HEPATIC FUNCTION PANEL
ALBUMIN: 3.3 g/dL — AB (ref 3.5–5.2)
ALK PHOS: 73 U/L (ref 39–117)
ALT: 15 U/L (ref 0–53)
AST: 20 U/L (ref 0–37)
Bilirubin, Direct: 0 mg/dL (ref 0.0–0.3)
TOTAL PROTEIN: 7.7 g/dL (ref 6.0–8.3)
Total Bilirubin: 0.3 mg/dL (ref 0.2–1.2)

## 2014-03-25 LAB — BASIC METABOLIC PANEL
BUN: 53 mg/dL — AB (ref 6–23)
CO2: 22 meq/L (ref 19–32)
Calcium: 8.9 mg/dL (ref 8.4–10.5)
Chloride: 111 mEq/L (ref 96–112)
Creatinine, Ser: 2.4 mg/dL — ABNORMAL HIGH (ref 0.4–1.5)
GFR: 33.75 mL/min — AB (ref >60.00)
GLUCOSE: 86 mg/dL (ref 70–99)
Potassium: 4.5 mEq/L (ref 3.5–5.1)
Sodium: 140 mEq/L (ref 135–145)

## 2014-03-25 LAB — CBC WITH DIFFERENTIAL/PLATELET
Basophils Absolute: 0 K/uL (ref 0.0–0.1)
Basophils Relative: 0.3 % (ref 0.0–3.0)
Eosinophils Absolute: 0.3 K/uL (ref 0.0–0.7)
Eosinophils Relative: 5 % (ref 0.0–5.0)
HCT: 29 % — ABNORMAL LOW (ref 39.0–52.0)
Hemoglobin: 9.1 g/dL — ABNORMAL LOW (ref 13.0–17.0)
Lymphocytes Relative: 32.4 % (ref 12.0–46.0)
Lymphs Abs: 1.8 K/uL (ref 0.7–4.0)
MCHC: 31.4 g/dL (ref 30.0–36.0)
MCV: 86.7 fl (ref 78.0–100.0)
Monocytes Absolute: 0.3 K/uL (ref 0.1–1.0)
Monocytes Relative: 5.2 % (ref 3.0–12.0)
Neutro Abs: 3.2 K/uL (ref 1.4–7.7)
Neutrophils Relative %: 57.1 % (ref 43.0–77.0)
Platelets: 140 K/uL — ABNORMAL LOW (ref 150.0–400.0)
RBC: 3.35 Mil/uL — ABNORMAL LOW (ref 4.22–5.81)
RDW: 14.7 % (ref 11.5–15.5)
WBC: 5.6 K/uL (ref 4.0–10.5)

## 2014-03-25 LAB — LIPID PANEL
Cholesterol: 175 mg/dL (ref 0–200)
HDL: 37.1 mg/dL — ABNORMAL LOW
LDL Cholesterol: 115 mg/dL — ABNORMAL HIGH (ref 0–99)
NonHDL: 137.9
Total CHOL/HDL Ratio: 5
Triglycerides: 113 mg/dL (ref 0.0–149.0)
VLDL: 22.6 mg/dL (ref 0.0–40.0)

## 2014-03-25 LAB — VITAMIN D 25 HYDROXY (VIT D DEFICIENCY, FRACTURES): VITD: 80.44 ng/mL (ref 30.00–100.00)

## 2014-03-25 LAB — HEMOGLOBIN A1C: Hgb A1c MFr Bld: 6.2 % (ref 4.6–6.5)

## 2014-03-25 LAB — TSH: TSH: 4.24 u[IU]/mL (ref 0.35–4.50)

## 2014-04-07 ENCOUNTER — Telehealth: Payer: Self-pay | Admitting: *Deleted

## 2014-04-07 ENCOUNTER — Ambulatory Visit: Payer: Medicare HMO | Admitting: Family Medicine

## 2014-04-07 NOTE — Telephone Encounter (Signed)
Pt needs no-show fee Jess- please call pt and see how he's doing

## 2014-04-07 NOTE — Telephone Encounter (Signed)
See notes below

## 2014-04-07 NOTE — Telephone Encounter (Signed)
Pt did not show for appointment 04/07/2014 at 9:30am for 2 week follow up

## 2014-04-07 NOTE — Telephone Encounter (Signed)
Pt states that he forgot about appt he has been at the doctor's with his wife. States that he will call the office to reschedule. Also states that he is feeling good and has not had any side effects from the medication.

## 2014-04-14 ENCOUNTER — Encounter: Payer: Self-pay | Admitting: Internal Medicine

## 2014-04-14 ENCOUNTER — Ambulatory Visit (INDEPENDENT_AMBULATORY_CARE_PROVIDER_SITE_OTHER): Payer: Medicare HMO | Admitting: Internal Medicine

## 2014-04-14 VITALS — BP 158/64 | HR 68 | Ht 68.0 in | Wt 181.0 lb

## 2014-04-14 DIAGNOSIS — I5022 Chronic systolic (congestive) heart failure: Secondary | ICD-10-CM

## 2014-04-14 DIAGNOSIS — I10 Essential (primary) hypertension: Secondary | ICD-10-CM

## 2014-04-14 DIAGNOSIS — Z4502 Encounter for adjustment and management of automatic implantable cardiac defibrillator: Secondary | ICD-10-CM

## 2014-04-14 DIAGNOSIS — I429 Cardiomyopathy, unspecified: Secondary | ICD-10-CM

## 2014-04-14 DIAGNOSIS — I428 Other cardiomyopathies: Secondary | ICD-10-CM

## 2014-04-14 DIAGNOSIS — Z95810 Presence of automatic (implantable) cardiac defibrillator: Secondary | ICD-10-CM

## 2014-04-14 NOTE — Patient Instructions (Signed)
Your physician has recommended you make the following change in your medication:  1) Coreg (carvedilol) 12.5 mg twice daily  Remote monitoring is used to monitor your Pacemaker of ICD from home. This monitoring reduces the number of office visits required to check your device to one time per year. It allows [place] to keep an eye on the functioning of your device to ensure it is working properly. You are scheduled for a device check from home on 07/18/14. You may send your transmission at any time that day. If you have a wireless device, the transmission will be sent automatically. After your physician reviews your transmission, you will receive a postcard with your next transmission date.  Your physician wants you to follow-up in: 1 year Dr. Gari Crown will receive a reminder letter in the mail two months in advance. If you don't receive a letter, please call our office to schedule the follow-up appointment.

## 2014-04-14 NOTE — Progress Notes (Signed)
Patient Care Team: [name], MD as PCP - General Rexene Agent, MD as Attending Physician (Nephrology)   HPI  Tyler Hahn is a 73 y.o. 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     He denies significant shortness of breath or peripheral edema; he has no orthopnea or nocturnal dyspnea.  His blood pressure home runs in the 130-140 range He has renal insufficiency;  creatinine was actually down to 2.410/15  He had been caring for his wife; she is now at [facility]  This is been a challenge for both of them.     Past Medical History  Diagnosis Date  . Hypertension   . Hyperlipidemia   . Hyperthyroidism   . Gout   . Renal insufficiency   . PONV (postoperative nausea and vomiting)   . CHF (congestive heart failure)     2000  . Anxiety   . Automatic implantable cardioverter-defibrillator in situ     greg taylor  . Diabetes mellitus     no meds    Past Surgical History  Procedure Laterality Date  . Cardiac defibrillator placement    . Icd,[employer]    . Polyp removal throat      difficulty speaking  . Enteroscopy N/A 11/25/2013    Procedure: ENTEROSCOPY;  Surgeon: Ladene Artist, MD;  Location: [facility];  Service: Endoscopy;  Laterality: N/A;  . Diagnostic laparoscopy  01/27/2014    Dr Brantley Stage  . Laparoscopy N/A 01/27/2014    Procedure: LAPAROSCOPY DIAGNOSTIC;  Surgeon: Joyice Faster. Cornett, MD;  Location: [facility];  Service: General;  Laterality: N/A;    Current Outpatient Prescriptions  Medication Sig Dispense Refill  . allopurinol (ZYLOPRIM) 100 MG tablet Take 100 mg by mouth daily.      Marland Kitchen atorvastatin (LIPITOR) 20 MG tablet Take 20 mg by mouth daily.      . calcitRIOL (ROCALTROL) 0.5 MCG capsule Take 0.5 mcg by mouth daily.      . carvedilol (COREG) 6.25 MG tablet Take 6.25 mg by mouth 2 (two) times daily with a meal.      . erythromycin ophthalmic ointment Place 1 application into  both eyes at bedtime.       Marland Kitchen lisinopril (PRINIVIL,ZESTRIL) 20 MG tablet Take 20 mg by mouth daily.      . methimazole (TAPAZOLE) 10 MG tablet Take 20 mg by mouth 2 (two) times daily.      . Omega 3 1200 MG CAPS Take 1,200 mg by mouth daily.      Marland Kitchen omeprazole (PRILOSEC) 40 MG capsule Take 40 mg by mouth daily.      Marland Kitchen oxyCODONE-acetaminophen (PERCOCET/ROXICET) 5-325 MG per tablet Take 1-2 tablets by mouth every 6 (six) hours as needed for severe pain.  40 tablet  0  . sertraline (ZOLOFT) 25 MG tablet Take 1 tablet (25 mg total) by mouth daily.  30 tablet  3  . torsemide (DEMADEX) 20 MG tablet Take 20 mg by mouth daily.      . Vitamin D, Ergocalciferol, (DRISDOL) 50000 UNITS CAPS capsule Take 50,000 Units by mouth every 7 (seven) days. Take 50,000 units by mouth on Sunday      . [DISCONTINUED] atenolol (TENORMIN) 50 MG tablet Take 75 mg by mouth daily.        . [DISCONTINUED] Insulin Glargine (LANTUS SOLOSTAR [place]) Inject into the skin as directed.         No current facility-administered  medications for this visit.    Allergies  Allergen Reactions  . Sulfonamide Derivatives Rash    REACTION: itching/rash     Review of Systems negative except from HPI and PMH  Physical Exam BP 158/64  Pulse 68  Ht 5\' 8"  (1.727 m)  Wt 181 lb (82.101 kg)  BMI 27.53 kg/m2 Well developed and well nourished in no acute distress HENT normal E scleral and icterus clear Neck Supple JVP flat; carotids brisk and full Clear to ausculation regular rate and rhythm Early systolic   murmurs no rub Device pocket well healed; without hematoma or erythema.  There is no tethering  Soft with active bowel sounds No clubbing cyanosis none Edema Alert and oriented, grossly normal motor and sensory function Skin Warm and Dry    Assessment and  Plan  Nonischemic cardiomyopathy  Continue Guideline directed medical therapy  Congestive heart failure-chronic-systolic   Euvolemic continue current meds  Hypertension  we will increase his carvedilol to 6.25--12.5  Implantable defibrillator-[employer] dual-chamber The patient's device was interrogated.  The information was reviewed. No changes were made in the programming.

## 2014-04-15 LAB — MDC_IDC_ENUM_SESS_TYPE_INCLINIC
HIGH POWER IMPEDANCE MEASURED VALUE: 44 Ohm
Implantable Pulse Generator Serial Number: 131093
Lead Channel Impedance Value: 584 Ohm
Lead Channel Pacing Threshold Amplitude: 0.6 V
Lead Channel Pacing Threshold Amplitude: 0.7 V
Lead Channel Pacing Threshold Pulse Width: 0.5 ms
Lead Channel Pacing Threshold Pulse Width: 0.5 ms
Lead Channel Sensing Intrinsic Amplitude: 7.3 mV
Lead Channel Setting Pacing Amplitude: 2 V
Lead Channel Setting Pacing Amplitude: 2.4 V
Lead Channel Setting Pacing Pulse Width: 0.5 ms
MDC IDC MSMT LEADCHNL RA IMPEDANCE VALUE: 406 Ohm
MDC IDC MSMT LEADCHNL RA SENSING INTR AMPL: 3.3 mV
MDC IDC SET ZONE DETECTION INTERVAL: 272.7 ms
MDC IDC SET ZONE DETECTION INTERVAL: 352.9 ms
MDC IDC STAT BRADY RA PERCENT PACED: 2 %
MDC IDC STAT BRADY RV PERCENT PACED: 1 % — AB

## 2014-07-06 ENCOUNTER — Encounter: Payer: Self-pay | Admitting: Family Medicine

## 2014-07-06 ENCOUNTER — Ambulatory Visit (INDEPENDENT_AMBULATORY_CARE_PROVIDER_SITE_OTHER): Payer: Medicare HMO | Admitting: Family Medicine

## 2014-07-06 VITALS — BP 140/80 | HR 103 | Temp 97.9°F | Resp 17 | Wt 186.5 lb

## 2014-07-06 DIAGNOSIS — N058 Unspecified nephritic syndrome with other morphologic changes: Secondary | ICD-10-CM

## 2014-07-06 DIAGNOSIS — R1013 Epigastric pain: Secondary | ICD-10-CM

## 2014-07-06 DIAGNOSIS — E1129 Type 2 diabetes mellitus with other diabetic kidney complication: Secondary | ICD-10-CM

## 2014-07-06 LAB — CBC WITH DIFFERENTIAL/PLATELET
BASOS PCT: 0.6 % (ref 0.0–3.0)
Basophils Absolute: 0 10*3/uL (ref 0.0–0.1)
EOS PCT: 4.9 % (ref 0.0–5.0)
Eosinophils Absolute: 0.3 10*3/uL (ref 0.0–0.7)
HCT: 35.4 % — ABNORMAL LOW (ref 39.0–52.0)
HEMOGLOBIN: 11.6 g/dL — AB (ref 13.0–17.0)
LYMPHS PCT: 25 % (ref 12.0–46.0)
Lymphs Abs: 1.5 10*3/uL (ref 0.7–4.0)
MCHC: 32.6 g/dL (ref 30.0–36.0)
MCV: 83.8 fl (ref 78.0–100.0)
Monocytes Absolute: 0.5 10*3/uL (ref 0.1–1.0)
Monocytes Relative: 8.5 % (ref 3.0–12.0)
NEUTROS PCT: 61 % (ref 43.0–77.0)
Neutro Abs: 3.7 10*3/uL (ref 1.4–7.7)
Platelets: 164 10*3/uL (ref 150.0–400.0)
RBC: 4.22 Mil/uL (ref 4.22–5.81)
RDW: 14.8 % (ref 11.5–15.5)
WBC: 6.1 10*3/uL (ref 4.0–10.5)

## 2014-07-06 LAB — BASIC METABOLIC PANEL
BUN: 48 mg/dL — ABNORMAL HIGH (ref 6–23)
CHLORIDE: 111 meq/L (ref 96–112)
CO2: 21 meq/L (ref 19–32)
Calcium: 9.3 mg/dL (ref 8.4–10.5)
Creatinine, Ser: 2.34 mg/dL — ABNORMAL HIGH (ref 0.40–1.50)
GFR: 35.4 mL/min — ABNORMAL LOW (ref >60.00)
Glucose, Bld: 143 mg/dL — ABNORMAL HIGH (ref 70–99)
Potassium: 4.4 mEq/L (ref 3.5–5.1)
Sodium: 138 mEq/L (ref 135–145)

## 2014-07-06 LAB — LIPASE: LIPASE: 43 U/L (ref 11.0–59.0)

## 2014-07-06 LAB — TROPONIN I: TNIDX: 0.01 ug/l (ref 0.00–0.06)

## 2014-07-06 LAB — HEPATIC FUNCTION PANEL
ALT: 61 U/L — ABNORMAL HIGH (ref 0–53)
AST: 29 U/L (ref 0–37)
Albumin: 3.9 g/dL (ref 3.5–5.2)
Alkaline Phosphatase: 81 U/L (ref 39–117)
BILIRUBIN TOTAL: 0.4 mg/dL (ref 0.2–1.2)
Bilirubin, Direct: 0.1 mg/dL (ref 0.0–0.3)
Total Protein: 7.6 g/dL (ref 6.0–8.3)

## 2014-07-06 LAB — AMYLASE: AMYLASE: 110 U/L (ref 27–131)

## 2014-07-06 LAB — HEMOGLOBIN A1C: HEMOGLOBIN A1C: 7.2 % — AB (ref 4.6–6.5)

## 2014-07-06 MED ORDER — OMEPRAZOLE 40 MG PO CPDR: 40.0000 mg | DELAYED_RELEASE_CAPSULE | Freq: Every day | ORAL | Status: DC

## 2014-07-06 NOTE — Progress Notes (Signed)
Pre visit review using our clinic review tool, if applicable. No additional management support is needed unless otherwise documented below in the visit note. 

## 2014-07-06 NOTE — Patient Instructions (Signed)
Follow up in 2 weeks if no improvement We'll notify you of your lab results and make any changes if needed Start the Omeprazole daily to decrease the acid production and decrease possible reflux Try and limit the amount of fat in your diet Call with any questions or concerns Hang in there!!

## 2014-07-06 NOTE — Progress Notes (Signed)
   Subjective:    Patient ID: Tyler Hahn, male    DOB: [birth year], 74 y.o.   MRN: [id number]  HPI abd pain- recurrent problem for pt.  Has seen [facility]) previously.  Pt reports recurrent epigastric pain that is occasionally associated w/ SOB.  Denies CP.  Pain is worse w/ bending forward.  Denies sour brash or heart burn.  Pt reports pain is sharp but quick.  Mild associated nausea.  + diarrhea.  No new meds.  Pt is eating out more due to wife being in [place]- typically goes to [employer].  Previously on [place] for reflux but pt thinks this ran out (has not been filled in over a year).  No known sick contacts.  DM- chronic problem, not currently on meds.  On ACE for renal protection.     Review of Systems For ROS see HPI     Objective:   Physical Exam  Constitutional: He is oriented to person, place, and time. He appears well-developed and well-nourished. No distress.  HENT:  Head: Normocephalic and atraumatic.  Eyes: Conjunctivae and EOM are normal. Pupils are equal, round, and reactive to light.  Neck: Normal range of motion. Neck supple. No thyromegaly present.  Cardiovascular: Normal rate, regular rhythm, normal heart sounds and intact distal pulses.   No murmur heard. Pulmonary/Chest: Effort normal and breath sounds normal. No respiratory distress.  Abdominal: Soft. Bowel sounds are normal. He exhibits no distension. There is no tenderness. There is no rebound and no guarding.  Musculoskeletal: He exhibits no edema.  Lymphadenopathy:    He has no cervical adenopathy.  Neurological: He is alert and oriented to person, place, and time. No cranial nerve deficit.  Skin: Skin is warm and dry.  Psychiatric: He has a normal mood and affect. His behavior is normal.  Vitals reviewed.         Assessment & Plan:

## 2014-07-07 ENCOUNTER — Other Ambulatory Visit: Payer: Self-pay | Admitting: Family Medicine

## 2014-07-07 ENCOUNTER — Other Ambulatory Visit: Payer: Self-pay | Admitting: Gastroenterology

## 2014-07-07 ENCOUNTER — Other Ambulatory Visit: Payer: Self-pay | Admitting: Endocrinology

## 2014-07-07 DIAGNOSIS — R7401 Elevation of levels of liver transaminase levels: Secondary | ICD-10-CM

## 2014-07-07 DIAGNOSIS — R740 Nonspecific elevation of levels of transaminase and lactic acid dehydrogenase [LDH]: Principal | ICD-10-CM

## 2014-07-07 DIAGNOSIS — R7989 Other specified abnormal findings of blood chemistry: Secondary | ICD-10-CM

## 2014-07-07 DIAGNOSIS — R945 Abnormal results of liver function studies: Principal | ICD-10-CM

## 2014-07-09 NOTE — Assessment & Plan Note (Signed)
New.  Suspect this is increased acid production/GERD.  Restart PPI.  Reviewed dietary and lifestyle modifications.  Check labs to r/o biliary abnormality, infection, H pylori.  EKG w/o signs of cardiac abnormality.  Reviewed supportive care and red flags that should prompt return.  Pt expressed understanding and is in agreement w/ plan.

## 2014-07-09 NOTE — Assessment & Plan Note (Signed)
Chronic problem.  Pt has not been good about f/u.  Since pt is in office today, will get A1C and adjust tx accordingly.

## 2014-07-11 ENCOUNTER — Other Ambulatory Visit: Payer: Self-pay | Admitting: General Practice

## 2014-07-11 ENCOUNTER — Encounter: Payer: Self-pay | Admitting: General Practice

## 2014-07-11 MED ORDER — TORSEMIDE 20 MG PO TABS: 20.0000 mg | ORAL_TABLET | Freq: Every day | ORAL | Status: DC

## 2014-07-11 MED ORDER — LISINOPRIL 20 MG PO TABS: 20.0000 mg | ORAL_TABLET | Freq: Every day | ORAL | Status: DC

## 2014-07-18 ENCOUNTER — Ambulatory Visit (INDEPENDENT_AMBULATORY_CARE_PROVIDER_SITE_OTHER): Payer: Medicare HMO | Admitting: *Deleted

## 2014-07-18 DIAGNOSIS — I428 Other cardiomyopathies: Secondary | ICD-10-CM

## 2014-07-18 DIAGNOSIS — I429 Cardiomyopathy, unspecified: Secondary | ICD-10-CM

## 2014-07-18 LAB — MDC_IDC_ENUM_SESS_TYPE_REMOTE
Brady Statistic RA Percent Paced: 2 %
Date Time Interrogation Session: 20160201053000
HighPow Impedance: 45 Ohm
Implantable Pulse Generator Serial Number: 131093
Lead Channel Impedance Value: 415 Ohm
Lead Channel Pacing Threshold Amplitude: 0.6 V
Lead Channel Pacing Threshold Pulse Width: 0.5 ms
Lead Channel Setting Pacing Amplitude: 2 V
Lead Channel Setting Pacing Amplitude: 2.4 V
Lead Channel Setting Sensing Sensitivity: 0.6 mV
MDC IDC MSMT BATTERY REMAINING LONGEVITY: 72 mo
MDC IDC MSMT BATTERY REMAINING PERCENTAGE: 86 %
MDC IDC MSMT LEADCHNL RA PACING THRESHOLD AMPLITUDE: 0.7 V
MDC IDC MSMT LEADCHNL RV IMPEDANCE VALUE: 440 Ohm
MDC IDC MSMT LEADCHNL RV PACING THRESHOLD PULSEWIDTH: 0.5 ms
MDC IDC SET LEADCHNL RV PACING PULSEWIDTH: 0.5 ms
MDC IDC STAT BRADY RV PERCENT PACED: 0 %
Zone Setting Detection Interval: 273 ms
Zone Setting Detection Interval: 353 ms

## 2014-07-18 NOTE — Progress Notes (Signed)
Remote ICD transmission.   

## 2014-07-26 ENCOUNTER — Encounter: Payer: Self-pay | Admitting: Cardiology

## 2014-07-28 ENCOUNTER — Encounter: Payer: Self-pay | Admitting: Family Medicine

## 2014-07-28 ENCOUNTER — Ambulatory Visit (INDEPENDENT_AMBULATORY_CARE_PROVIDER_SITE_OTHER): Payer: Medicare HMO | Admitting: Family Medicine

## 2014-07-28 ENCOUNTER — Other Ambulatory Visit: Payer: Self-pay | Admitting: General Practice

## 2014-07-28 VITALS — BP 138/88 | HR 104 | Temp 97.9°F | Resp 16 | Wt 182.4 lb

## 2014-07-28 DIAGNOSIS — I10 Essential (primary) hypertension: Secondary | ICD-10-CM

## 2014-07-28 DIAGNOSIS — N189 Chronic kidney disease, unspecified: Secondary | ICD-10-CM

## 2014-07-28 DIAGNOSIS — R7989 Other specified abnormal findings of blood chemistry: Secondary | ICD-10-CM

## 2014-07-28 DIAGNOSIS — R809 Proteinuria, unspecified: Secondary | ICD-10-CM

## 2014-07-28 DIAGNOSIS — R945 Abnormal results of liver function studies: Secondary | ICD-10-CM

## 2014-07-28 DIAGNOSIS — R8290 Unspecified abnormal findings in urine: Secondary | ICD-10-CM

## 2014-07-28 DIAGNOSIS — E0590 Thyrotoxicosis, unspecified without thyrotoxic crisis or storm: Secondary | ICD-10-CM

## 2014-07-28 DIAGNOSIS — N289 Disorder of kidney and ureter, unspecified: Secondary | ICD-10-CM

## 2014-07-28 DIAGNOSIS — R609 Edema, unspecified: Secondary | ICD-10-CM

## 2014-07-28 LAB — BASIC METABOLIC PANEL
BUN: 51 mg/dL — AB (ref 6–23)
CALCIUM: 9.1 mg/dL (ref 8.4–10.5)
CO2: 20 mEq/L (ref 19–32)
CREATININE: 2.88 mg/dL — AB (ref 0.40–1.50)
Chloride: 113 mEq/L — ABNORMAL HIGH (ref 96–112)
GFR: 27.85 mL/min — AB (ref >60.00)
GLUCOSE: 118 mg/dL — AB (ref 70–99)
Potassium: 4.9 mEq/L (ref 3.5–5.1)
Sodium: 139 mEq/L (ref 135–145)

## 2014-07-28 LAB — POCT URINALYSIS DIPSTICK
BILIRUBIN UA: NEGATIVE
Glucose, UA: NEGATIVE
Ketones, UA: NEGATIVE
Leukocytes, UA: NEGATIVE
Nitrite, UA: NEGATIVE
PH UA: 5
RBC UA: NEGATIVE
Spec Grav, UA: 1.025
UROBILINOGEN UA: 0.2

## 2014-07-28 LAB — CBC WITH DIFFERENTIAL/PLATELET
Basophils Absolute: 0 10*3/uL (ref 0.0–0.1)
Basophils Relative: 0.4 % (ref 0.0–3.0)
EOS ABS: 0.2 10*3/uL (ref 0.0–0.7)
EOS PCT: 4.4 % (ref 0.0–5.0)
HCT: 34.8 % — ABNORMAL LOW (ref 39.0–52.0)
Hemoglobin: 11.4 g/dL — ABNORMAL LOW (ref 13.0–17.0)
Lymphocytes Relative: 23.6 % (ref 12.0–46.0)
Lymphs Abs: 1.2 10*3/uL (ref 0.7–4.0)
MCHC: 32.7 g/dL (ref 30.0–36.0)
MCV: 83.5 fl (ref 78.0–100.0)
Monocytes Absolute: 0.3 10*3/uL (ref 0.1–1.0)
Monocytes Relative: 6.6 % (ref 3.0–12.0)
NEUTROS ABS: 3.3 10*3/uL (ref 1.4–7.7)
Neutrophils Relative %: 65 % (ref 43.0–77.0)
Platelets: 169 10*3/uL (ref 150.0–400.0)
RBC: 4.17 Mil/uL — AB (ref 4.22–5.81)
RDW: 15.5 % (ref 11.5–15.5)
WBC: 5.1 10*3/uL (ref 4.0–10.5)

## 2014-07-28 LAB — HEPATIC FUNCTION PANEL
ALT: 71 U/L — ABNORMAL HIGH (ref 0–53)
AST: 23 U/L (ref 0–37)
Albumin: 3.7 g/dL (ref 3.5–5.2)
Alkaline Phosphatase: 92 U/L (ref 39–117)
Bilirubin, Direct: 0.1 mg/dL (ref 0.0–0.3)
Total Bilirubin: 0.4 mg/dL (ref 0.2–1.2)
Total Protein: 7.3 g/dL (ref 6.0–8.3)

## 2014-07-28 LAB — TSH: TSH: 4.78 u[IU]/mL — AB (ref 0.35–4.50)

## 2014-07-28 MED ORDER — TORSEMIDE 20 MG PO TABS: 20.0000 mg | ORAL_TABLET | Freq: Every day | ORAL | Status: DC

## 2014-07-28 MED ORDER — LISINOPRIL 20 MG PO TABS: 20.0000 mg | ORAL_TABLET | Freq: Every day | ORAL | Status: DC

## 2014-07-28 MED ORDER — CARVEDILOL 12.5 MG PO TABS: 12.5000 mg | ORAL_TABLET | Freq: Two times a day (BID) | ORAL | Status: DC

## 2014-07-28 NOTE — Progress Notes (Signed)
Pre visit review using our clinic review tool, if applicable. No additional management support is needed unless otherwise documented below in the visit note. 

## 2014-07-28 NOTE — Patient Instructions (Signed)
We'll notify you of your lab results and make any changes if needed We resent your Lisinopril, Torsamide and Carvedilol to [facility] call you with your Endocrinology appt Make sure you are drinking fluids and limiting your salt intake Call with any questions or concerns Hang in there! Happy Valentine's Day!!

## 2014-07-28 NOTE — Progress Notes (Signed)
   Subjective:    Patient ID: Tyler Hahn, male    DOB: 1941/06/09, 74 y.o.   MRN: [id number]  HPI Pt reports 'foamy' urine and bilateral leg swelling for 'a couple weeks'.  Denies dizziness.  Pt has hx of renal insufficiency.  Some increased thirst.   Pt has appt upcoming w/ nephrology 1st week of March.  Medication problems- pt has been to pharmacy 3x and still doesn't have the meds he needs.  Is not currently Coreg, Lisinopril, Torsemide, Methimazole.  Methimazole was denied by Dr Loanne Drilling b/c pt needs appt.  Pt is supposed to be on the other 3.  Review of Systems For ROS see HPI     Objective:   Physical Exam  Constitutional: He is oriented to person, place, and time. He appears well-developed and well-nourished. No distress.  HENT:  Head: Normocephalic and atraumatic.  Eyes: Conjunctivae and EOM are normal. Pupils are equal, round, and reactive to light.  Neck: Normal range of motion. Neck supple. No thyromegaly present.  Cardiovascular: Normal rate, regular rhythm and intact distal pulses.   No murmur heard. Pulmonary/Chest: Effort normal and breath sounds normal. No respiratory distress.  Abdominal: Soft. Bowel sounds are normal. He exhibits no distension.  Musculoskeletal: He exhibits edema (1-2+ edema of LEs bilaterally).  Lymphadenopathy:    He has no cervical adenopathy.  Neurological: He is alert and oriented to person, place, and time. No cranial nerve deficit.  Skin: Skin is warm and dry.  Psychiatric: He has a normal mood and affect. His behavior is normal.  Vitals reviewed.         Assessment & Plan:

## 2014-07-29 ENCOUNTER — Encounter: Payer: Self-pay | Admitting: Internal Medicine

## 2014-07-29 ENCOUNTER — Other Ambulatory Visit: Payer: Self-pay | Admitting: Family Medicine

## 2014-07-29 ENCOUNTER — Telehealth: Payer: Self-pay | Admitting: Endocrinology

## 2014-07-29 DIAGNOSIS — R945 Abnormal results of liver function studies: Principal | ICD-10-CM

## 2014-07-29 DIAGNOSIS — R7989 Other specified abnormal findings of blood chemistry: Secondary | ICD-10-CM

## 2014-07-29 LAB — URINE CULTURE: Colony Count: 9000

## 2014-07-29 NOTE — Telephone Encounter (Signed)
please call patient: New pt ov is due

## 2014-07-29 NOTE — Telephone Encounter (Signed)
Scheduled for 08/03/2014 at 4pm.

## 2014-07-31 NOTE — Assessment & Plan Note (Signed)
Concern that pt is having nephrotic syndrome w/ 3+ protein in urine today, LE edema, and reports of foamy urine.  Will forward labs to Nephrology.

## 2014-07-31 NOTE — Assessment & Plan Note (Signed)
New.  Unclear if this is nephrotic syndrome due to the amount of protein in his urine or if it is b/c he was unaware that he has not been taking his medication appropriately- in this case, his Demadex and Lisinopril.  Check labs.  Refer back to nephrology.  Restart diuretic.  Will follow.

## 2014-07-31 NOTE — Assessment & Plan Note (Signed)
Chronic problem.  BP is mildly elevated but it was discovered that pt has not been taking his Coreg, Lisinopril, Torsamide.  Scripts sent to pharmacy.  Will follow.

## 2014-07-31 NOTE — Assessment & Plan Note (Addendum)
Noted on labs done at last visit.  Due for repeat labs.  If still elevated, will need imaging.

## 2014-07-31 NOTE — Assessment & Plan Note (Signed)
Chronic problem.  Due for f/u w/ Endo.  Will attempt to assist him in scheduling this as he has difficulty remembering to set up f/u appts.

## 2014-08-03 ENCOUNTER — Ambulatory Visit: Payer: Medicare HMO | Admitting: Endocrinology

## 2014-08-08 ENCOUNTER — Encounter: Payer: Self-pay | Admitting: Family Medicine

## 2014-08-08 ENCOUNTER — Other Ambulatory Visit: Payer: Self-pay | Admitting: Family Medicine

## 2014-08-08 ENCOUNTER — Ambulatory Visit (HOSPITAL_BASED_OUTPATIENT_CLINIC_OR_DEPARTMENT_OTHER)
Admission: RE | Admit: 2014-08-08 | Discharge: 2014-08-08 | Disposition: A | Discharge status: Home / Self Care | Payer: Medicare HMO | Source: Ambulatory Visit | Attending: Family Medicine | Admitting: Family Medicine

## 2014-08-08 DIAGNOSIS — R7989 Other specified abnormal findings of blood chemistry: Secondary | ICD-10-CM | POA: Diagnosis not present

## 2014-08-08 DIAGNOSIS — I10 Essential (primary) hypertension: Secondary | ICD-10-CM | POA: Diagnosis not present

## 2014-08-08 DIAGNOSIS — K760 Fatty (change of) liver, not elsewhere classified: Secondary | ICD-10-CM

## 2014-08-08 DIAGNOSIS — E780 Pure hypercholesterolemia: Secondary | ICD-10-CM | POA: Diagnosis not present

## 2014-08-08 DIAGNOSIS — R945 Abnormal results of liver function studies: Secondary | ICD-10-CM

## 2014-08-08 DIAGNOSIS — N289 Disorder of kidney and ureter, unspecified: Secondary | ICD-10-CM | POA: Insufficient documentation

## 2014-08-10 ENCOUNTER — Telehealth: Payer: Self-pay | Admitting: Endocrinology

## 2014-08-10 NOTE — Telephone Encounter (Signed)
Patient no showed today's appt. Please advise on how to follow up. °A. No follow up necessary. °B. Follow up urgent. Contact patient immediately. °C. Follow up necessary. Contact patient and schedule visit in ___ days. °D. Follow up advised. Contact patient and schedule visit in ____weeks. ° °

## 2014-08-10 NOTE — Telephone Encounter (Signed)
Front desk to contact pt to reschedule.

## 2014-08-10 NOTE — Telephone Encounter (Signed)
New pt appt is advised. Contact patient and schedule visit in 2 weeks.

## 2014-08-11 ENCOUNTER — Other Ambulatory Visit: Payer: Self-pay | Admitting: Family Medicine

## 2014-08-11 NOTE — Telephone Encounter (Signed)
Rx sent to the pharmacy by e-script.//AB/CMA 

## 2014-08-12 ENCOUNTER — Ambulatory Visit (INDEPENDENT_AMBULATORY_CARE_PROVIDER_SITE_OTHER): Payer: Medicare HMO | Admitting: Endocrinology

## 2014-08-12 ENCOUNTER — Encounter: Payer: Self-pay | Admitting: Endocrinology

## 2014-08-12 VITALS — BP 126/70 | HR 75 | Temp 98.0°F | Ht 68.0 in | Wt 177.0 lb

## 2014-08-12 DIAGNOSIS — E0590 Thyrotoxicosis, unspecified without thyrotoxic crisis or storm: Secondary | ICD-10-CM

## 2014-08-12 MED ORDER — METHIMAZOLE 10 MG PO TABS: 10.0000 mg | ORAL_TABLET | Freq: Every day | ORAL | Status: DC

## 2014-08-12 NOTE — Progress Notes (Signed)
Subjective:    Patient ID: Tyler Hahn, male    DOB: 21-Aug-1940, 74 y.o.   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  Pt says he never misses the tapazole.  pt states he feels well in general.  He is overdue for f/u.  Past Medical History  Diagnosis Date  . Hypertension   . Hyperlipidemia   . Hyperthyroidism   . Gout   . Renal insufficiency   . PONV (postoperative nausea and vomiting)   . CHF (congestive heart failure)     2000  . Anxiety   . Automatic implantable cardioverter-defibrillator in situ     greg taylor  . Diabetes mellitus     no meds    Past Surgical History  Procedure Laterality Date  . Cardiac defibrillator placement    . Icd,[place] [facility]    . Polyp removal throat      difficulty speaking  . Enteroscopy N/A 11/25/2013    Procedure: ENTEROSCOPY;  Surgeon: Ladene Artist, MD;  Location: [facility];  Service: Endoscopy;  Laterality: N/A;  . Diagnostic laparoscopy  01/27/2014    Dr Brantley Stage  . Laparoscopy N/A 01/27/2014    Procedure: LAPAROSCOPY DIAGNOSTIC;  Surgeon: Joyice Faster. Cornett, MD;  Location: [facility];  Service: General;  Laterality: N/A;    History   Social History  . Marital Status: Married    Spouse Name: N/A  . Number of Children: 4  . Years of Education: N/A   Occupational History  . Retired    Social History Main Topics  . Smoking status: Former Smoker    Types: Cigarettes    Quit date: 11/26/1998  . Smokeless tobacco: Never Used  . Alcohol Use: No     Comment: quit  2008  . Drug Use: No  . Sexual Activity: Not on file   Other Topics Concern  . Not on file   Social History Narrative    Current Outpatient Prescriptions on File Prior to Visit  Medication Sig Dispense Refill  . allopurinol (ZYLOPRIM) 100 MG tablet Take 100  mg by mouth daily.    Marland Kitchen atorvastatin (LIPITOR) 20 MG tablet Take 20 mg by mouth daily.    . calcitRIOL (ROCALTROL) 0.5 MCG capsule Take 0.5 mcg by mouth daily.    . carvedilol (COREG) 12.5 MG tablet Take 1 tablet (12.5 mg total) by mouth 2 (two) times daily. 60 tablet 3  . erythromycin ophthalmic ointment Place 1 application into both eyes at bedtime.     Marland Kitchen lisinopril (PRINIVIL,ZESTRIL) 20 MG tablet Take 1 tablet (20 mg total) by mouth daily. 30 tablet 3  . Omega 3 1200 MG CAPS Take 1,200 mg by mouth daily.    Marland Kitchen oxyCODONE-acetaminophen (PERCOCET/ROXICET) 5-325 MG per tablet Take 1-2 tablets by mouth every 6 (six) hours as needed for severe pain. 40 tablet 0  . sertraline (ZOLOFT) 25 MG tablet TAKE ONE TABLET BY MOUTH ONCE DAILY 30 tablet 3  . torsemide (DEMADEX) 20 MG tablet Take 1 tablet (20 mg total) by mouth daily. 30 tablet 3  . [DISCONTINUED] atenolol (TENORMIN) 50 MG tablet Take 75 mg by mouth daily.      . [DISCONTINUED] Insulin Glargine (LANTUS SOLOSTAR [place]) Inject into the skin as directed.       No current  facility-administered medications on file prior to visit.    Allergies  Allergen Reactions  . Sulfonamide Derivatives Rash    REACTION: itching/rash     Family History  Problem Relation Age of Onset  . Stomach cancer Mother   . Diabetes Father   . Heart disease Father   . Liver disease Father   . Kidney disease Father     BP 126/70 mmHg  Pulse 75  Temp(Src) 98 F (36.7 C) (Oral)  Ht 5\' 8"  (1.727 m)  Wt 177 lb (80.287 kg)  BMI 26.92 kg/m2  SpO2 97%   Review of Systems He denies fever.      Objective:   Physical Exam VITAL SIGNS:  See vs page GENERAL: no distress NECK: There is no palpable thyroid enlargement.  No thyroid nodule is palpable.  No palpable lymphadenopathy at the anterior neck.    Lab Results  Component Value Date   TSH 4.78* 07/28/2014   T3TOTAL 2.2 08/17/2009   T4TOTAL 6.7 08/17/2009       Assessment & Plan:  Hyperthyroidism,  overcontrolled.  Patient is advised the following: Patient Instructions  Your thyroid has gone a little too low.  Therefore, please stop the methimazole altogether for 1 week.  Then resume at just 1 pill per day. Please come back for a follow-up appointment in 6 weeks.  if ever you have fever while taking methimazole, stop it and call us, because of the risk of a rare side-effect.

## 2014-08-12 NOTE — Patient Instructions (Signed)
Your thyroid has gone a little too low.  Therefore, please stop the methimazole altogether for 1 week.  Then resume at just 1 pill per day. Please come back for a follow-up appointment in 6 weeks.  if ever you have fever while taking methimazole, stop it and call us, because of the risk of a rare side-effect.

## 2014-08-25 ENCOUNTER — Other Ambulatory Visit: Payer: Self-pay | Admitting: General Practice

## 2014-08-25 MED ORDER — ATORVASTATIN CALCIUM 20 MG PO TABS: 20.0000 mg | ORAL_TABLET | Freq: Every day | ORAL | Status: DC

## 2014-09-05 ENCOUNTER — Ambulatory Visit (INDEPENDENT_AMBULATORY_CARE_PROVIDER_SITE_OTHER): Payer: Medicare HMO | Admitting: Gastroenterology

## 2014-09-05 ENCOUNTER — Encounter: Payer: Self-pay | Admitting: Gastroenterology

## 2014-09-05 ENCOUNTER — Other Ambulatory Visit (INDEPENDENT_AMBULATORY_CARE_PROVIDER_SITE_OTHER): Payer: Medicare HMO

## 2014-09-05 VITALS — BP 130/76 | HR 84 | Ht 65.5 in | Wt 178.0 lb

## 2014-09-05 DIAGNOSIS — R945 Abnormal results of liver function studies: Principal | ICD-10-CM

## 2014-09-05 DIAGNOSIS — K760 Fatty (change of) liver, not elsewhere classified: Secondary | ICD-10-CM | POA: Diagnosis not present

## 2014-09-05 DIAGNOSIS — R7989 Other specified abnormal findings of blood chemistry: Secondary | ICD-10-CM

## 2014-09-05 LAB — FERRITIN: Ferritin: 190 ng/mL (ref 22.0–322.0)

## 2014-09-05 LAB — IBC PANEL
Iron: 82 ug/dL (ref 42–165)
Saturation Ratios: 27.1 % (ref 20.0–50.0)
Transferrin: 216 mg/dL (ref 212.0–360.0)

## 2014-09-05 LAB — IGA: IGA: 227 mg/dL (ref 68–378)

## 2014-09-05 LAB — PROTIME-INR
INR: 1 ratio (ref 0.8–1.0)
Prothrombin Time: 11.2 s (ref 9.6–13.1)

## 2014-09-05 LAB — HEPATITIS B SURFACE ANTIBODY,QUALITATIVE: HEP B S AB: NEGATIVE

## 2014-09-05 LAB — HEPATITIS B SURFACE ANTIGEN: Hepatitis B Surface Ag: NEGATIVE

## 2014-09-05 LAB — HEPATITIS C ANTIBODY: HCV Ab: NEGATIVE

## 2014-09-05 NOTE — Progress Notes (Signed)
History of Present Illness: This is a 74 year old male with mildly elevated ALT, 61 in January  and 71 in February. Abdominal ultrasound below. The patient has no history of liver disease. He has a former history of alcohol use and quit all alcohol use in 2008. He was significantly overweight a couple of years ago. He lost weight however his BMI remains 29. He has diabetes mellitus that is currently diet controlled and hyperlipidemia maintained on Lipitor.   IMPRESSION: Limited exam.  Probable mild fatty infiltration of liver as above.  Hypoechoic nodules within both kidneys, indeterminate by ultrasound the patient's has had multiple cysts in the kidneys bilaterally on a prior CT from 2015, lesions overall little changed in sizes.  Allergies  Allergen Reactions  . Sulfonamide Derivatives Rash    REACTION: itching/rash    Outpatient Prescriptions Prior to Visit  Medication Sig Dispense Refill  . allopurinol (ZYLOPRIM) 100 MG tablet Take 100 mg by mouth daily.    Marland Kitchen atorvastatin (LIPITOR) 20 MG tablet Take 1 tablet (20 mg total) by mouth daily. 30 tablet 6  . calcitRIOL (ROCALTROL) 0.5 MCG capsule Take 0.5 mcg by mouth daily.    . carvedilol (COREG) 12.5 MG tablet Take 1 tablet (12.5 mg total) by mouth 2 (two) times daily. 60 tablet 3  . erythromycin ophthalmic ointment Place 1 application into both eyes at bedtime.     Marland Kitchen lisinopril (PRINIVIL,ZESTRIL) 20 MG tablet Take 1 tablet (20 mg total) by mouth daily. 30 tablet 3  . methimazole (TAPAZOLE) 10 MG tablet Take 1 tablet (10 mg total) by mouth daily. 30 tablet 2  . Omega 3 1200 MG CAPS Take 1,200 mg by mouth daily.    Marland Kitchen oxyCODONE-acetaminophen (PERCOCET/ROXICET) 5-325 MG per tablet Take 1-2 tablets by mouth every 6 (six) hours as needed for severe pain. 40 tablet 0  . sertraline (ZOLOFT) 25 MG tablet TAKE ONE TABLET BY MOUTH ONCE DAILY 30 tablet 3  . torsemide (DEMADEX) 20 MG tablet Take 1 tablet (20 mg total) by mouth daily. 30  tablet 3   No facility-administered medications prior to visit.   Past Medical History  Diagnosis Date  . Hypertension   . Hyperlipidemia   . Hyperthyroidism   . Gout   . Renal insufficiency   . PONV (postoperative nausea and vomiting)   . CHF (congestive heart failure)     2000  . Anxiety   . Automatic implantable cardioverter-defibrillator in situ     greg taylor  . Diabetes mellitus     no meds  . Fatty liver   . Kidney cysts    Past Surgical History  Procedure Laterality Date  . Cardiac defibrillator placement    . Icd,[place] [facility]    . Polyp removal throat      difficulty speaking  . Enteroscopy N/A 11/25/2013    Procedure: ENTEROSCOPY;  Surgeon: Ladene Artist, MD;  Location: [facility];  Service: Endoscopy;  Laterality: N/A;  . Diagnostic laparoscopy  01/27/2014    Dr Brantley Stage  . Laparoscopy N/A 01/27/2014    Procedure: LAPAROSCOPY DIAGNOSTIC;  Surgeon: Joyice Faster. Cornett, MD;  Location: [facility];  Service: General;  Laterality: N/A;   History   Social History  . Marital Status: Married    Spouse Name: N/A  . Number of Children: 4  . Years of Education: N/A   Occupational History  . Retired    Social History Main Topics  . Smoking status: Former Smoker    Types:  Cigarettes    Quit date: 11/26/1998  . Smokeless tobacco: Never Used  . Alcohol Use: No     Comment: quit  2008  . Drug Use: No  . Sexual Activity: Not on file   Other Topics Concern  . None   Social History Narrative   Family History  Problem Relation Age of Onset  . Stomach cancer Mother   . Diabetes Father   . Heart disease Father   . Liver disease Father   . Kidney disease Father      Physical Exam: General: Well developed , well nourished, no acute distress Head: Normocephalic and atraumatic Eyes:  sclerae anicteric, EOMI Ears: Normal auditory acuity Mouth: No deformity or lesions Lungs: Clear throughout to auscultation Heart: Regular rate and rhythm; no murmurs, rubs or  bruits Abdomen: Soft, non tender and non distended. No masses, hepatosplenomegaly or hernias noted. Normal Bowel sounds Musculoskeletal: Symmetrical with no gross deformities  Pulses:  Normal pulses noted Extremities: No clubbing, cyanosis, edema or deformities noted Neurological: Alert oriented x 4, grossly nonfocal Psychological:  Alert and cooperative. Normal mood and affect  Assessment and Recommendations:  1. Fatty liver and mildly elevated ALT. Rule out other causes of liver disease. Rule out medication side effects such as Lipitor leading to elevated ALT. Send standard hepatic serologies. Long-term low fat, weight loss diet to target ideal body weight supervised by his PCP. Long-term close management of diabetes mellitus and hyperlipidemia. Her LFTs about every 3-6 months per PCP. Consider trial of stopping Lipitor no other cause elevated LFTs is uncovered.

## 2014-09-05 NOTE — Patient Instructions (Addendum)
Your physician has requested that you go to the basement for lab work before leaving today.  Thank you for choosing me and [facility].  Pricilla Riffle. Dagoberto Ligas., MD., Marval Regal  cc: [name], MD

## 2014-09-06 ENCOUNTER — Encounter: Payer: Self-pay | Admitting: Gastroenterology

## 2014-09-06 LAB — ANA: ANA: NEGATIVE

## 2014-09-06 LAB — ANTI-SMOOTH MUSCLE ANTIBODY, IGG: SMOOTH MUSCLE AB: 11 U (ref <20)

## 2014-09-06 LAB — TISSUE TRANSGLUTAMINASE, IGA: Tissue Transglutaminase Ab, IgA: 1 U/mL (ref <4)

## 2014-09-07 LAB — MITOCHONDRIAL ANTIBODIES: Mitochondrial M2 Ab, IgG: 2.92 — ABNORMAL HIGH (ref <0.91)

## 2014-09-07 LAB — ALPHA-1-ANTITRYPSIN: A1 ANTITRYPSIN SER: 134 mg/dL (ref 83–199)

## 2014-09-07 LAB — CERULOPLASMIN: Ceruloplasmin: 23 mg/dL (ref 18–36)

## 2014-09-23 ENCOUNTER — Ambulatory Visit (INDEPENDENT_AMBULATORY_CARE_PROVIDER_SITE_OTHER): Payer: Medicare HMO | Admitting: Endocrinology

## 2014-09-23 ENCOUNTER — Encounter: Payer: Self-pay | Admitting: Endocrinology

## 2014-09-23 VITALS — BP 128/78 | HR 63 | Temp 98.1°F | Ht 65.5 in | Wt 175.0 lb

## 2014-09-23 DIAGNOSIS — E0590 Thyrotoxicosis, unspecified without thyrotoxic crisis or storm: Secondary | ICD-10-CM

## 2014-09-23 LAB — T4, FREE: Free T4: 0.53 ng/dL — ABNORMAL LOW (ref 0.60–1.60)

## 2014-09-23 LAB — TSH: TSH: 6.63 u[IU]/mL — ABNORMAL HIGH (ref 0.35–4.50)

## 2014-09-23 MED ORDER — METHIMAZOLE 5 MG PO TABS: 5.0000 mg | ORAL_TABLET | Freq: Three times a day (TID) | ORAL | Status: DC

## 2014-09-23 NOTE — Patient Instructions (Addendum)
blood tests are requested for you today.  We'll let you know about the results. Please come back for a follow-up appointment in 6 weeks.  if ever you have fever while taking methimazole, stop it and call us, because of the risk of a rare side-effect.    We can do the radioactive iodine if you wish.  It works like this: We would first check a thyroid "scan" (a special, but easy and painless type of thyroid x ray).  It works like this: you go to the x-ray department of the hospital to swallow a pill, which contains a miniscule amount of radiation.  You will not notice any symptoms from this.  You will go back to the x-ray department the next day, to lie down in front of a camera.  The results of this will be sent to me.   Based on the results, i hope to order for you a treatment pill of radioactive iodine.  Although it is a larger amount of radiation, you will again notice no symptoms from this.  The pill is gone from your body in a few days (during which you should stay away from other people), but takes several months to work.  Therefore, please return here approximately 6-8 weeks after the treatment.  This treatment has been available for many years, and the only known side-effect is an underactive thyroid.  It is possible that i would eventually prescribe for you a thyroid hormone pill, which is very inexpensive.  You don't have to worry about side-effects of this thyroid hormone pill, because it is the same molecule your thyroid makes.

## 2014-09-23 NOTE — Progress Notes (Signed)
Subjective:    Patient ID: Tyler Hahn, male    DOB: 01-24-1941, 75 y.o.   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  Pt says he never misses the tapazole. pt states he feels well in general. Past Medical History  Diagnosis Date  . Hypertension   . Hyperlipidemia   . Hyperthyroidism   . Gout   . Renal insufficiency   . PONV (postoperative nausea and vomiting)   . CHF (congestive heart failure)     2000  . Anxiety   . Automatic implantable cardioverter-defibrillator in situ     greg taylor  . Diabetes mellitus     no meds  . Fatty liver   . Kidney cysts     Past Surgical History  Procedure Laterality Date  . Cardiac defibrillator placement    . Icd,[place] [facility]    . Polyp removal throat      difficulty speaking  . Enteroscopy N/A 11/25/2013    Procedure: ENTEROSCOPY;  Surgeon: Ladene Artist, MD;  Location: [facility];  Service: Endoscopy;  Laterality: N/A;  . Diagnostic laparoscopy  01/27/2014    Dr Brantley Stage  . Laparoscopy N/A 01/27/2014    Procedure: LAPAROSCOPY DIAGNOSTIC;  Surgeon: Joyice Faster. Cornett, MD;  Location: [facility];  Service: General;  Laterality: N/A;    History   Social History  . Marital Status: Married    Spouse Name: N/A  . Number of Children: 4  . Years of Education: N/A   Occupational History  . Retired    Social History Main Topics  . Smoking status: Former Smoker    Types: Cigarettes    Quit date: 11/26/1998  . Smokeless tobacco: Never Used  . Alcohol Use: No     Comment: quit  2008  . Drug Use: No  . Sexual Activity: Not on file   Other Topics Concern  . Not on file   Social History Narrative    Current Outpatient Prescriptions on File Prior to Visit  Medication Sig Dispense Refill  . allopurinol (ZYLOPRIM) 100 MG tablet  Take 100 mg by mouth daily.    Marland Kitchen atorvastatin (LIPITOR) 20 MG tablet Take 1 tablet (20 mg total) by mouth daily. 30 tablet 6  . calcitRIOL (ROCALTROL) 0.5 MCG capsule Take 0.5 mcg by mouth daily.    . carvedilol (COREG) 12.5 MG tablet Take 1 tablet (12.5 mg total) by mouth 2 (two) times daily. 60 tablet 3  . erythromycin ophthalmic ointment Place 1 application into both eyes at bedtime.     Marland Kitchen lisinopril (PRINIVIL,ZESTRIL) 20 MG tablet Take 1 tablet (20 mg total) by mouth daily. 30 tablet 3  . Omega 3 1200 MG CAPS Take 1,200 mg by mouth daily.    Marland Kitchen oxyCODONE-acetaminophen (PERCOCET/ROXICET) 5-325 MG per tablet Take 1-2 tablets by mouth every 6 (six) hours as needed for severe pain. 40 tablet 0  . sertraline (ZOLOFT) 25 MG tablet TAKE ONE TABLET BY MOUTH ONCE DAILY 30 tablet 3  . torsemide (DEMADEX) 20 MG tablet Take 1 tablet (20 mg total) by mouth daily. 30 tablet 3  . [DISCONTINUED] atenolol (TENORMIN) 50 MG tablet Take 75 mg by mouth daily.      . [DISCONTINUED] Insulin Glargine (LANTUS SOLOSTAR [place]) Inject into the skin as directed.  No current facility-administered medications on file prior to visit.    Allergies  Allergen Reactions  . Sulfonamide Derivatives Rash    REACTION: itching/rash     Family History  Problem Relation Age of Onset  . Stomach cancer Mother   . Diabetes Father   . Heart disease Father   . Liver disease Father   . Kidney disease Father     BP 128/78 mmHg  Pulse 63  Temp(Src) 98.1 F (36.7 C) (Oral)  Ht 5' 5.5" (1.664 m)  Wt 175 lb (79.379 kg)  BMI 28.67 kg/m2  SpO2 99%  Review of Systems Denies fever.     Objective:   Physical Exam VITAL SIGNS:  See vs page GENERAL: no distress Skin: not diaphoretic Neuro: no tremor   Lab Results  Component Value Date   TSH 6.63* 09/23/2014   T3TOTAL 2.2 08/17/2009   T4TOTAL 6.7 08/17/2009      Assessment & Plan:  Hyperthyroidism: overcontrolled  Patient is advised the following: Patient  Instructions  blood tests are requested for you today.  We'll let you know about the results. Please come back for a follow-up appointment in 6 weeks.  if ever you have fever while taking methimazole, stop it and call us, because of the risk of a rare side-effect.    We can do the radioactive iodine if you wish.  It works like this: We would first check a thyroid "scan" (a special, but easy and painless type of thyroid x ray).  It works like this: you go to the x-ray department of the hospital to swallow a pill, which contains a miniscule amount of radiation.  You will not notice any symptoms from this.  You will go back to the x-ray department the next day, to lie down in front of a camera.  The results of this will be sent to me.   Based on the results, i hope to order for you a treatment pill of radioactive iodine.  Although it is a larger amount of radiation, you will again notice no symptoms from this.  The pill is gone from your body in a few days (during which you should stay away from other people), but takes several months to work.  Therefore, please return here approximately 6-8 weeks after the treatment.  This treatment has been available for many years, and the only known side-effect is an underactive thyroid.  It is possible that i would eventually prescribe for you a thyroid hormone pill, which is very inexpensive.  You don't have to worry about side-effects of this thyroid hormone pill, because it is the same molecule your thyroid makes.  addendum: stop the methimazole, then resume it at 5 mg daily

## 2014-10-17 ENCOUNTER — Ambulatory Visit (INDEPENDENT_AMBULATORY_CARE_PROVIDER_SITE_OTHER): Payer: Medicare HMO | Admitting: *Deleted

## 2014-10-17 DIAGNOSIS — I429 Cardiomyopathy, unspecified: Secondary | ICD-10-CM | POA: Diagnosis not present

## 2014-10-17 DIAGNOSIS — I428 Other cardiomyopathies: Secondary | ICD-10-CM

## 2014-10-18 NOTE — Progress Notes (Signed)
Remote ICD transmission.   

## 2014-10-20 LAB — CUP PACEART REMOTE DEVICE CHECK
Battery Remaining Longevity: 72 mo
Battery Remaining Percentage: 85 %
Brady Statistic RA Percent Paced: 1 %
Brady Statistic RV Percent Paced: 0 %
HighPow Impedance: 47 Ohm
Lead Channel Impedance Value: 419 Ohm
Lead Channel Pacing Threshold Amplitude: 0.7 V
Lead Channel Pacing Threshold Pulse Width: 0.5 ms
Lead Channel Pacing Threshold Pulse Width: 0.5 ms
Lead Channel Sensing Intrinsic Amplitude: 2.6 mV
Lead Channel Sensing Intrinsic Amplitude: 8.1 mV
Lead Channel Setting Sensing Sensitivity: 0.6 mV
MDC IDC MSMT LEADCHNL RV IMPEDANCE VALUE: 550 Ohm
MDC IDC MSMT LEADCHNL RV PACING THRESHOLD AMPLITUDE: 0.6 V
MDC IDC SESS DTM: 20160502043100
MDC IDC SET LEADCHNL RA PACING AMPLITUDE: 2 V
MDC IDC SET LEADCHNL RV PACING AMPLITUDE: 2.4 V
MDC IDC SET LEADCHNL RV PACING PULSEWIDTH: 0.5 ms
MDC IDC SET ZONE DETECTION INTERVAL: 273 ms
Pulse Gen Serial Number: 131093
Zone Setting Detection Interval: 353 ms

## 2014-11-04 ENCOUNTER — Encounter: Payer: Self-pay | Admitting: Endocrinology

## 2014-11-04 ENCOUNTER — Ambulatory Visit (INDEPENDENT_AMBULATORY_CARE_PROVIDER_SITE_OTHER): Payer: Medicare HMO | Admitting: Endocrinology

## 2014-11-04 VITALS — BP 138/86 | HR 84 | Temp 97.9°F | Wt 182.0 lb

## 2014-11-04 DIAGNOSIS — E0590 Thyrotoxicosis, unspecified without thyrotoxic crisis or storm: Secondary | ICD-10-CM | POA: Diagnosis not present

## 2014-11-04 LAB — TSH: TSH: 6.47 u[IU]/mL — ABNORMAL HIGH (ref 0.35–4.50)

## 2014-11-04 LAB — T4, FREE: FREE T4: 0.68 ng/dL (ref 0.60–1.60)

## 2014-11-04 NOTE — Progress Notes (Signed)
Subjective:    Patient ID: Tyler Hahn, male    DOB: 1940/06/23, 74 y.o.   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  Pt says he never misses the tapazole. pt states he feels well in general.  Hx is from pt and dtr.   Past Medical History  Diagnosis Date  . Hypertension   . Hyperlipidemia   . Hyperthyroidism   . Gout   . Renal insufficiency   . PONV (postoperative nausea and vomiting)   . CHF (congestive heart failure)     2000  . Anxiety   . Automatic implantable cardioverter-defibrillator in situ     greg taylor  . Diabetes mellitus     no meds  . Fatty liver   . Kidney cysts     Past Surgical History  Procedure Laterality Date  . Cardiac defibrillator placement    . Icd,[facility]    . Polyp removal throat      difficulty speaking  . Enteroscopy N/A 11/25/2013    Procedure: ENTEROSCOPY;  Surgeon: Ladene Artist, MD;  Location: [facility];  Service: Endoscopy;  Laterality: N/A;  . Diagnostic laparoscopy  01/27/2014    Dr Brantley Stage  . Laparoscopy N/A 01/27/2014    Procedure: LAPAROSCOPY DIAGNOSTIC;  Surgeon: Joyice Faster. Cornett, MD;  Location: [place];  Service: General;  Laterality: N/A;    History   Social History  . Marital Status: Married    Spouse Name: N/A  . Number of Children: 4  . Years of Education: N/A   Occupational History  . Retired    Social History Main Topics  . Smoking status: Former Smoker    Types: Cigarettes    Quit date: 11/26/1998  . Smokeless tobacco: Never Used  . Alcohol Use: No     Comment: quit  2008  . Drug Use: No  . Sexual Activity: Not on file   Other Topics Concern  . Not on file   Social History Narrative    Current Outpatient Prescriptions on File Prior  to Visit  Medication Sig Dispense Refill  . allopurinol (ZYLOPRIM) 100 MG tablet Take 100 mg by mouth daily.    Marland Kitchen atorvastatin (LIPITOR) 20 MG tablet Take 1 tablet (20 mg total) by mouth daily. 30 tablet 6  . calcitRIOL (ROCALTROL) 0.5 MCG capsule Take 0.5 mcg by mouth daily.    . carvedilol (COREG) 12.5 MG tablet Take 1 tablet (12.5 mg total) by mouth 2 (two) times daily. 60 tablet 3  . erythromycin ophthalmic ointment Place 1 application into both eyes at bedtime.     Marland Kitchen lisinopril (PRINIVIL,ZESTRIL) 20 MG tablet Take 1 tablet (20 mg total) by mouth daily. 30 tablet 3  . Omega 3 1200 MG CAPS Take 1,200 mg by mouth daily.    Marland Kitchen oxyCODONE-acetaminophen (PERCOCET/ROXICET) 5-325 MG per tablet Take 1-2 tablets by mouth every 6 (six) hours as needed for severe pain. 40 tablet 0  . sertraline (ZOLOFT) 25 MG tablet TAKE ONE TABLET BY MOUTH ONCE DAILY 30 tablet 3  . torsemide (DEMADEX) 20 MG tablet Take 1 tablet (20 mg total) by mouth daily. 30 tablet 3  . [DISCONTINUED] atenolol (TENORMIN) 50 MG tablet Take 75 mg by mouth daily.      . [  DISCONTINUED] Insulin Glargine (LANTUS SOLOSTAR [place]) Inject into the skin as directed.       No current facility-administered medications on file prior to visit.    Allergies  Allergen Reactions  . Sulfonamide Derivatives Rash    REACTION: itching/rash     Family History  Problem Relation Age of Onset  . Stomach cancer Mother   . Diabetes Father   . Heart disease Father   . Liver disease Father   . Kidney disease Father     BP 138/86 mmHg  Pulse 84  Temp(Src) 97.9 F (36.6 C) (Oral)  Wt 182 lb (82.555 kg)  SpO2 98%  Review of Systems Denies fever.      Objective:   Physical Exam VITAL SIGNS:  See vs page GENERAL: no distress NECK: There is no palpable thyroid enlargement.  No thyroid nodule is palpable.  No palpable lymphadenopathy at the anterior neck.   Lab Results  Component Value Date   TSH 6.47* 11/04/2014   T3TOTAL 2.2 08/17/2009    T4TOTAL 6.7 08/17/2009      Assessment & Plan:  Hyperthyroidism: overcontrolled.   Patient is advised the following: Patient Instructions  blood tests are requested for you today.  We'll let you know about the results. Please come back for a follow-up appointment in 2 months.  if ever you have fever while taking methimazole, stop it and call us, because of the risk of a rare side-effect.     addendum: reduce tapazole to 5 mg tiw

## 2014-11-04 NOTE — Patient Instructions (Addendum)
blood tests are requested for you today.  We'll let you know about the results. Please come back for a follow-up appointment in 2 months.  if ever you have fever while taking methimazole, stop it and call us, because of the risk of a rare side-effect.

## 2014-11-07 ENCOUNTER — Encounter: Payer: Self-pay | Admitting: Cardiology

## 2014-11-10 ENCOUNTER — Encounter: Payer: Self-pay | Admitting: Internal Medicine

## 2014-12-05 ENCOUNTER — Other Ambulatory Visit: Payer: Self-pay | Admitting: General Practice

## 2014-12-05 MED ORDER — LISINOPRIL 20 MG PO TABS: 20.0000 mg | ORAL_TABLET | Freq: Every day | ORAL | Status: DC

## 2014-12-05 MED ORDER — CARVEDILOL 12.5 MG PO TABS: 12.5000 mg | ORAL_TABLET | Freq: Two times a day (BID) | ORAL | Status: DC

## 2014-12-05 MED ORDER — ATORVASTATIN CALCIUM 20 MG PO TABS: 20.0000 mg | ORAL_TABLET | Freq: Every day | ORAL | Status: DC

## 2015-01-04 ENCOUNTER — Ambulatory Visit (INDEPENDENT_AMBULATORY_CARE_PROVIDER_SITE_OTHER): Payer: Medicare HMO | Admitting: Endocrinology

## 2015-01-04 VITALS — BP 134/80 | HR 75 | Temp 98.7°F | Resp 16 | Ht 67.5 in | Wt 185.0 lb

## 2015-01-04 DIAGNOSIS — E0590 Thyrotoxicosis, unspecified without thyrotoxic crisis or storm: Secondary | ICD-10-CM | POA: Diagnosis not present

## 2015-01-04 LAB — T4, FREE: FREE T4: 0.59 ng/dL — AB (ref 0.60–1.60)

## 2015-01-04 LAB — TSH: TSH: 7.58 u[IU]/mL — AB (ref 0.35–4.50)

## 2015-01-04 NOTE — Progress Notes (Signed)
Subjective:    Patient ID: Tyler Hahn, male    DOB: 1940/08/24, 74 y.o.   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  Pt says he never misses the tapazole. Pt reports of slight swelling at the anterior neck, but no assoc pain Past Medical History  Diagnosis Date  . Hypertension   . Hyperlipidemia   . Hyperthyroidism   . Gout   . Renal insufficiency   . PONV (postoperative nausea and vomiting)   . CHF (congestive heart failure)     2000  . Anxiety   . Automatic implantable cardioverter-defibrillator in situ     greg taylor  . Diabetes mellitus     no meds  . Fatty liver   . Kidney cysts     Past Surgical History  Procedure Laterality Date  . Cardiac defibrillator placement    . Icd,[facility] [facility]    . Polyp removal throat      difficulty speaking  . Enteroscopy N/A 11/25/2013    Procedure: ENTEROSCOPY;  Surgeon: Ladene Artist, MD;  Location: [facility];  Service: Endoscopy;  Laterality: N/A;  . Diagnostic laparoscopy  01/27/2014    Dr Brantley Stage  . Laparoscopy N/A 01/27/2014    Procedure: LAPAROSCOPY DIAGNOSTIC;  Surgeon: Joyice Faster. Cornett, MD;  Location: [place];  Service: General;  Laterality: N/A;    History   Social History  . Marital Status: Married    Spouse Name: N/A  . Number of Children: 4  . Years of Education: N/A   Occupational History  . Retired    Social History Main Topics  . Smoking status: Former Smoker    Types: Cigarettes    Quit date: 11/26/1998  . Smokeless tobacco: Never Used  . Alcohol Use: No     Comment: quit  2008  . Drug Use: No  . Sexual Activity: Not on file   Other Topics Concern  . Not on file   Social History Narrative    Current Outpatient Prescriptions on File  Prior to Visit  Medication Sig Dispense Refill  . allopurinol (ZYLOPRIM) 100 MG tablet Take 100 mg by mouth daily.    Marland Kitchen atorvastatin (LIPITOR) 20 MG tablet Take 1 tablet (20 mg total) by mouth daily. 90 tablet 1  . calcitRIOL (ROCALTROL) 0.5 MCG capsule Take 0.5 mcg by mouth daily.    . carvedilol (COREG) 12.5 MG tablet Take 1 tablet (12.5 mg total) by mouth 2 (two) times daily. 180 tablet 1  . erythromycin ophthalmic ointment Place 1 application into both eyes at bedtime.     Marland Kitchen lisinopril (PRINIVIL,ZESTRIL) 20 MG tablet Take 1 tablet (20 mg total) by mouth daily. 90 tablet 1  . methimazole (TAPAZOLE) 5 MG tablet 5 mg, 3 times per week    . Omega 3 1200 MG CAPS Take 1,200 mg by mouth daily.    Marland Kitchen oxyCODONE-acetaminophen (PERCOCET/ROXICET) 5-325 MG per tablet Take 1-2 tablets by mouth every 6 (six) hours as needed for severe pain. 40 tablet 0  . sertraline (ZOLOFT) 25 MG tablet TAKE ONE TABLET BY MOUTH ONCE DAILY 30 tablet 3  . torsemide (DEMADEX) 20 MG tablet Take 1 tablet (20 mg total) by mouth daily. 30 tablet 3  . [DISCONTINUED] atenolol (TENORMIN)  50 MG tablet Take 75 mg by mouth daily.      . [DISCONTINUED] Insulin Glargine (LANTUS SOLOSTAR [place]) Inject into the skin as directed.       No current facility-administered medications on file prior to visit.    Allergies  Allergen Reactions  . Sulfonamide Derivatives Rash    REACTION: itching/rash     Family History  Problem Relation Age of Onset  . Stomach cancer Mother   . Diabetes Father   . Heart disease Father   . Liver disease Father   . Kidney disease Father     BP 134/80 mmHg  Pulse 75  Temp(Src) 98.7 F (37.1 C) (Oral)  Resp 16  Ht 5' 7.5" (1.715 m)  Wt 185 lb (83.915 kg)  BMI 28.53 kg/m2  SpO2 98%    Review of Systems Denies fever and sob    Objective:   Physical Exam VITAL SIGNS:  See vs page GENERAL: no distress NECK: There is no palpable thyroid enlargement.  No thyroid nodule is palpable.  No palpable  lymphadenopathy at the anterior neck.   Lab Results  Component Value Date   TSH 7.58* 01/04/2015   T3TOTAL 2.2 08/17/2009   T4TOTAL 6.7 08/17/2009       Assessment & Plan:  Hyperthyroidism, still overcontolled Neck sxs, new, uncertain etiology  Patient is advised the following: Patient Instructions  blood tests are requested for you today.  We'll let you know about the results. Let's also check the ultrasound. Please come back for a follow-up appointment in 3 months.    addendum: d/c tapazole.  Please come back for a follow-up appointment in 2 months.

## 2015-01-04 NOTE — Patient Instructions (Signed)
blood tests are requested for you today.  We'll let you know about the results. Let's also check the ultrasound. Please come back for a follow-up appointment in 3 months.

## 2015-01-09 ENCOUNTER — Ambulatory Visit
Admission: RE | Admit: 2015-01-09 | Discharge: 2015-01-09 | Disposition: A | Discharge status: Home / Self Care | Payer: Medicare HMO | Source: Ambulatory Visit | Attending: Endocrinology | Admitting: Endocrinology

## 2015-01-09 DIAGNOSIS — E0590 Thyrotoxicosis, unspecified without thyrotoxic crisis or storm: Secondary | ICD-10-CM

## 2015-01-16 ENCOUNTER — Ambulatory Visit (INDEPENDENT_AMBULATORY_CARE_PROVIDER_SITE_OTHER): Payer: Medicare HMO | Admitting: *Deleted

## 2015-01-16 DIAGNOSIS — I428 Other cardiomyopathies: Secondary | ICD-10-CM

## 2015-01-16 DIAGNOSIS — I429 Cardiomyopathy, unspecified: Secondary | ICD-10-CM

## 2015-01-17 NOTE — Progress Notes (Signed)
Remote ICD transmission.   

## 2015-01-25 LAB — CUP PACEART REMOTE DEVICE CHECK
Battery Remaining Percentage: 81 %
Brady Statistic RV Percent Paced: 0 %
Date Time Interrogation Session: 20160802140900
HIGH POWER IMPEDANCE MEASURED VALUE: 46 Ohm
Lead Channel Impedance Value: 394 Ohm
Lead Channel Impedance Value: 538 Ohm
Lead Channel Pacing Threshold Amplitude: 0.6 V
Lead Channel Pacing Threshold Amplitude: 0.7 V
Lead Channel Pacing Threshold Pulse Width: 0.5 ms
Lead Channel Pacing Threshold Pulse Width: 0.5 ms
Lead Channel Setting Pacing Amplitude: 2.4 V
Lead Channel Setting Sensing Sensitivity: 0.6 mV
MDC IDC MSMT BATTERY REMAINING LONGEVITY: 66 mo
MDC IDC SET LEADCHNL RA PACING AMPLITUDE: 2 V
MDC IDC SET LEADCHNL RV PACING PULSEWIDTH: 0.5 ms
MDC IDC STAT BRADY RA PERCENT PACED: 2 %
Pulse Gen Serial Number: 131093
Zone Setting Detection Interval: 273 ms
Zone Setting Detection Interval: 353 ms

## 2015-02-07 ENCOUNTER — Telehealth: Payer: Self-pay

## 2015-02-07 NOTE — Telephone Encounter (Signed)
Medicare wellness scheduled with [facility].

## 2015-02-14 ENCOUNTER — Ambulatory Visit (INDEPENDENT_AMBULATORY_CARE_PROVIDER_SITE_OTHER): Payer: Medicare HMO

## 2015-02-14 VITALS — BP 134/82 | HR 65 | Ht 67.0 in | Wt 184.8 lb

## 2015-02-14 DIAGNOSIS — Z23 Encounter for immunization: Secondary | ICD-10-CM

## 2015-02-14 DIAGNOSIS — Z0000 Encounter for general adult medical examination without abnormal findings: Secondary | ICD-10-CM

## 2015-02-14 DIAGNOSIS — Z1211 Encounter for screening for malignant neoplasm of colon: Secondary | ICD-10-CM

## 2015-02-14 NOTE — Patient Instructions (Addendum)
Schedule physical appointment and lab work with Dr. Birdie Riddle.  Scheduled eye exam and  Colonoscopy.     Try to eat three meal per day.  Include lean protein, fruits/veggies, and whole grains.  Increase physical activity.   Weight bearing exercises such as walking.  Stop whenever you experience chest pain or shortness of breath.    Try to stay active and interact with others.      Colonoscopy A colonoscopy is an exam to look at the entire large intestine (colon). This exam can help find problems such as tumors, polyps, inflammation, and areas of bleeding. The exam takes about 1 hour.  LET [facility] CARE PROVIDER KNOW ABOUT:   Any allergies you have.  All medicines you are taking, including vitamins, herbs, eye drops, creams, and over-the-counter medicines.  Previous problems you or members of your family have had with the use of anesthetics.  Any blood disorders you have.  Previous surgeries you have had.  Medical conditions you have. RISKS AND COMPLICATIONS  Generally, this is a safe procedure. However, as with any procedure, complications can occur. Possible complications include:  Bleeding.  Tearing or rupture of the colon wall.  Reaction to medicines given during the exam.  Infection (rare). BEFORE THE PROCEDURE   Ask your health care provider about changing or stopping your regular medicines.  You may be prescribed an oral bowel prep. This involves drinking a large amount of medicated liquid, starting the day before your procedure. The liquid will cause you to have multiple loose stools until your stool is almost clear or light green. This cleans out your colon in preparation for the procedure.  Do not eat or drink anything else once you have started the bowel prep, unless your health care provider tells you it is safe to do so.  Arrange for someone to drive you home after the procedure. PROCEDURE   You will be given medicine to help you relax (sedative).  You  will lie on your side with your knees bent.  A long, flexible tube with a light and camera on the end (colonoscope) will be inserted through the rectum and into the colon. The camera sends video back to a computer screen as it moves through the colon. The colonoscope also releases carbon dioxide gas to inflate the colon. This helps your health care provider see the area better.  During the exam, your health care provider may take a small tissue sample (biopsy) to be examined under a microscope if any abnormalities are found.  The exam is finished when the entire colon has been viewed. AFTER THE PROCEDURE   Do not drive for 24 hours after the exam.  You may have a small amount of blood in your stool.  You may pass moderate amounts of gas and have mild abdominal cramping or bloating. This is caused by the gas used to inflate your colon during the exam.  Ask when your test results will be ready and how you will get your results. Make sure you get your test results. Document Released: 05/31/2000 Document Revised: 03/24/2013 Document Reviewed: 02/08/2013 [facility] Patient Information 2015 [place], [place]. This information is not intended to replace advice given to you by your health care provider. Make sure you discuss any questions you have with your health care provider.  Fall Prevention and Home Safety Falls cause injuries and can affect all age groups. It is possible to prevent falls.  HOW TO PREVENT FALLS  Wear shoes with rubber soles that  do not have an opening for your toes.  Keep the inside and outside of your house well lit.  Use night lights throughout your home.  Remove clutter from floors.  Clean up floor spills.  Remove throw rugs or fasten them to the floor with carpet tape.  Do not place electrical cords across pathways.  Put grab bars by your tub, shower, and toilet. Do not use towel bars as grab bars.  Put handrails on both sides of the stairway. Fix loose  handrails.  Do not climb on stools or stepladders, if possible.  Do not wax your floors.  Repair uneven or unsafe sidewalks, walkways, or stairs.  Keep items you use a lot within reach.  Be aware of pets.  Keep emergency numbers next to the telephone.  Put smoke detectors in your home and near bedrooms. Ask your doctor what other things you can do to prevent falls. Document Released: 03/30/2009 Document Revised: 12/03/2011 Document Reviewed: 09/03/2011 [facility] Patient Information 2015 [place], [place]. This information is not intended to replace advice given to you by your health care provider. Make sure you discuss any questions you have with your health care provider.  Health Maintenance A healthy lifestyle and preventative care can promote health and wellness.  Maintain regular health, dental, and eye exams.  Eat a healthy diet. Foods like vegetables, fruits, whole grains, low-fat dairy products, and lean protein foods contain the nutrients you need and are low in calories. Decrease your intake of foods high in solid fats, added sugars, and salt. Get information about a proper diet from your health care provider, if necessary.  Regular physical exercise is one of the most important things you can do for your health. Most adults should get at least 150 minutes of moderate-intensity exercise (any activity that increases your heart rate and causes you to sweat) each week. In addition, most adults need muscle-strengthening exercises on 2 or more days a week.   Maintain a healthy weight. The body mass index (BMI) is a screening tool to identify possible weight problems. It provides an estimate of body fat based on height and weight. Your health care provider can find your BMI and can help you achieve or maintain a healthy weight. For males 20 years and older:  A BMI below 18.5 is considered underweight.  A BMI of 18.5 to 24.9 is normal.  A BMI of 25 to 29.9 is considered  overweight.  A BMI of 30 and above is considered obese.  Maintain normal blood lipids and cholesterol by exercising and minimizing your intake of saturated fat. Eat a balanced diet with plenty of fruits and vegetables. Blood tests for lipids and cholesterol should begin at age 64 and be repeated every 5 years. If your lipid or cholesterol levels are high, you are over age 10, or you are at high risk for heart disease, you may need your cholesterol levels checked more frequently.Ongoing high lipid and cholesterol levels should be treated with medicines if diet and exercise are not working.  If you smoke, find out from your health care provider how to quit. If you do not use tobacco, do not start.  Lung cancer screening is recommended for adults aged 51-80 years who are at high risk for developing lung cancer because of a history of smoking. A yearly low-dose CT scan of the lungs is recommended for people who have at least a 30-pack-year history of smoking and are current smokers or have quit within the past 15 years.  A pack year of smoking is smoking an average of 1 pack of cigarettes a day for 1 year (for example, a 30-pack-year history of smoking could mean smoking 1 pack a day for 30 years or 2 packs a day for 15 years). Yearly screening should continue until the smoker has stopped smoking for at least 15 years. Yearly screening should be stopped for people who develop a health problem that would prevent them from having lung cancer treatment.  If you choose to drink alcohol, do not have more than 2 drinks per day. One drink is considered to be 12 oz (360 mL) of beer, 5 oz (150 mL) of wine, or 1.5 oz (45 mL) of liquor.  Avoid the use of street drugs. Do not share needles with anyone. Ask for help if you need support or instructions about stopping the use of drugs.  High blood pressure causes heart disease and increases the risk of stroke. Blood pressure should be checked at least every 1-2 years.  Ongoing high blood pressure should be treated with medicines if weight loss and exercise are not effective.  If you are 80-27 years old, ask your health care provider if you should take aspirin to prevent heart disease.  Diabetes screening involves taking a blood sample to check your fasting blood sugar level. This should be done once every 3 years after age 106 if you are at a normal weight and without risk factors for diabetes. Testing should be considered at a younger age or be carried out more frequently if you are overweight and have at least 1 risk factor for diabetes.  Colorectal cancer can be detected and often prevented. Most routine colorectal cancer screening begins at the age of 36 and continues through age 36. However, your health care provider may recommend screening at an earlier age if you have risk factors for colon cancer. On a yearly basis, your health care provider may provide home test kits to check for hidden blood in the stool. A small camera at the end of a tube may be used to directly examine the colon (sigmoidoscopy or colonoscopy) to detect the earliest forms of colorectal cancer. Talk to your health care provider about this at age 10 when routine screening begins. A direct exam of the colon should be repeated every 5-10 years through age 76, unless early forms of precancerous polyps or small growths are found.  People who are at an increased risk for hepatitis B should be screened for this virus. You are considered at high risk for hepatitis B if:  You were born in a country where hepatitis B occurs often. Talk with your health care provider about which countries are considered high risk.  Your parents were born in a high-risk country and you have not received a shot to protect against hepatitis B (hepatitis B vaccine).  You have HIV or AIDS.  You use needles to inject street drugs.  You live with, or have sex with, someone who has hepatitis B.  You are a man who has  sex with other men (MSM).  You get hemodialysis treatment.  You take certain medicines for conditions like cancer, organ transplantation, and autoimmune conditions.  Hepatitis C blood testing is recommended for all people born from [birth year] through 1964 and any individual with known risk factors for hepatitis C.  Healthy men should no longer receive prostate-specific antigen (PSA) blood tests as part of routine cancer screening. Talk to your health care provider about prostate cancer screening.  Testicular cancer screening is not recommended for adolescents or adult males who have no symptoms. Screening includes self-exam, a health care provider exam, and other screening tests. Consult with your health care provider about any symptoms you have or any concerns you have about testicular cancer.  Practice safe sex. Use condoms and avoid high-risk sexual practices to reduce the spread of sexually transmitted infections (STIs).  You should be screened for STIs, including gonorrhea and chlamydia if:  You are sexually active and are younger than 24 years.  You are older than 24 years, and your health care provider tells you that you are at risk for this type of infection.  Your sexual activity has changed since you were last screened, and you are at an increased risk for chlamydia or gonorrhea. Ask your health care provider if you are at risk.  If you are at risk of being infected with HIV, it is recommended that you take a prescription medicine daily to prevent HIV infection. This is called pre-exposure prophylaxis (PrEP). You are considered at risk if:  You are a man who has sex with other men (MSM).  You are a heterosexual man who is sexually active with multiple partners.  You take drugs by injection.  You are sexually active with a partner who has HIV.  Talk with your health care provider about whether you are at high risk of being infected with HIV. If you choose to begin PrEP, you should  first be tested for HIV. You should then be tested every 3 months for as long as you are taking PrEP.  Use sunscreen. Apply sunscreen liberally and repeatedly throughout the day. You should seek shade when your shadow is shorter than you. Protect yourself by wearing long sleeves, pants, a wide-brimmed hat, and sunglasses year round whenever you are outdoors.  Tell your health care provider of new moles or changes in moles, especially if there is a change in shape or color. Also, tell your health care provider if a mole is larger than the size of a pencil eraser.  A one-time screening for abdominal aortic aneurysm (AAA) and surgical repair of large AAAs by ultrasound is recommended for men aged 54-75 years who are current or former smokers.  Stay current with your vaccines (immunizations). Document Released: 11/30/2007 Document Revised: 06/08/2013 Document Reviewed: 10/29/2010 [facility] Patient Information 2015 [place], [place]. This information is not intended to replace advice given to you by your health care provider. Make sure you discuss any questions you have with your health care provider.

## 2015-02-14 NOTE — Progress Notes (Addendum)
Subjective:   Tyler Hahn is a 74 y.o. male who presents for Medicare Annual/Subsequent preventive examination.  Review of Systems:  NO ROS  Sleep patterns:   Goes to bed around 11 pm wakes up around 2 am.  Doesn't sleep well.   Home Safety/Smoke Alarms:  Feels safe.  Lives at home alone.  Smoke detectors.  Alarm system and gated.   Firearm Safety:  Kept in safe placed.   Seat Belt Safety/Bike Helmet:  Always wears seat belt.     Counseling:   Eye Exam- Plans to schedule an appointment.  Dental- Plans to scheduled an appointment.   Male:  CCS-DUE, last exam 08/29/14.  Advised to schedule an appointment. Referral placed.      PSA-  Last 08/15/11   Objective:    Vitals: BP 134/82 mmHg  Pulse 65  Ht 5\' 7"  (1.702 m)  Wt 184 lb 12.8 oz (83.825 kg)  BMI 28.94 kg/m2  SpO2 99%  Tobacco History  Smoking status  . Former Smoker  . Types: Cigarettes  . Quit date: 11/26/1998  Smokeless tobacco  . Never Used     Counseling given: Yes   Past Medical History  Diagnosis Date  . Hypertension   . Hyperlipidemia   . Hyperthyroidism   . Gout   . Renal insufficiency   . PONV (postoperative nausea and vomiting)   . CHF (congestive heart failure)     2000  . Anxiety   . Automatic implantable cardioverter-defibrillator in situ     greg taylor  . Diabetes mellitus     no meds  . Fatty liver   . Kidney cysts    Past Surgical History  Procedure Laterality Date  . Cardiac defibrillator placement    . Icd,[facility] scentific    . Polyp removal throat      difficulty speaking  . Enteroscopy N/A 11/25/2013    Procedure: ENTEROSCOPY;  Surgeon: Ladene Artist, MD;  [facility]: [facility];  Service: Endoscopy;  Laterality: N/A;  . Diagnostic laparoscopy  01/27/2014    Dr Brantley Stage  . Laparoscopy N/A 01/27/2014    Procedure: LAPAROSCOPY DIAGNOSTIC;  Surgeon: Joyice Faster. Cornett, MD;  Location: [place];  Service: General;  Laterality: N/A;   Family History  Problem Relation Age of Onset   . Stomach cancer Mother   . Diabetes Father   . Heart disease Father   . Liver disease Father   . Kidney disease Father   . Alzheimer's disease Mother    History  Sexual Activity  . Sexual Activity: Not on file    Outpatient Encounter Prescriptions as of 02/14/2015  Medication Sig  . allopurinol (ZYLOPRIM) 100 MG tablet Take 100 mg by mouth daily.  Marland Kitchen atorvastatin (LIPITOR) 20 MG tablet Take 1 tablet (20 mg total) by mouth daily.  . calcitRIOL (ROCALTROL) 0.5 MCG capsule Take 0.5 mcg by mouth daily.  . carvedilol (COREG) 12.5 MG tablet Take 1 tablet (12.5 mg total) by mouth 2 (two) times daily.  Marland Kitchen erythromycin ophthalmic ointment Place 1 application into both eyes at bedtime.   Marland Kitchen lisinopril (PRINIVIL,ZESTRIL) 20 MG tablet Take 1 tablet (20 mg total) by mouth daily.  . Omega 3 1200 MG CAPS Take 1,200 mg by mouth daily.  Marland Kitchen oxyCODONE-acetaminophen (PERCOCET/ROXICET) 5-325 MG per tablet Take 1-2 tablets by mouth every 6 (six) hours as needed for severe pain.  Marland Kitchen sertraline (ZOLOFT) 25 MG tablet TAKE ONE TABLET BY MOUTH ONCE DAILY  . torsemide (DEMADEX) 20 MG  tablet Take 1 tablet (20 mg total) by mouth daily.   No facility-administered encounter medications on file as of 02/14/2015.    Activities of Daily Living In your present state of health, do you have any difficulty performing the following activities: 02/14/2015  Hearing? N  Vision? Y  Difficulty concentrating or making decisions? N  Walking or climbing stairs? Y  Dressing or bathing? N  Doing errands, shopping? N  Preparing Food and eating ? N  Using the Toilet? N  In the past six months, have you accidently leaked urine? N  Do you have problems with loss of bowel control? N  Managing your Medications? N  Managing your Finances? Y  Housekeeping or managing your Housekeeping? N    Patient Care Team: [name], MD as PCP - General Rexene Agent, MD as Attending Physician (Nephrology) [name], MD as  Consulting Physician (Cardiology) Renato Shin, MD as Consulting Physician (Endocrinology)   Assessment:  Type 2 Diabetes-  Chronic condition.  Not currently on medication.   Last A1c- 07/06/14- 7.2. Encouraged to follow up with PCP.    Chronic Renal Insufficiency-  Following nephrology.     Exercise Activities and Dietary recommendations Current Exercise Habits:: The patient does not participate in regular exercise at present  Diet: Breakfast-bacon or cereal, Lunch-very seldom, Dinner-1 meat and veggie.  Very little bread or sugary foods.     Goals    . Have 3 meals a day    . Increase physical activity     Silver sneakers, walking around neighborhood, or [employer] the Pounds.        Fall Risk Fall Risk  02/14/2015 07/06/2014 08/17/2012  Falls in the past year? Yes No No  Number falls in past yr: 1 - -  Injury with Fall? No - -  Risk for fall due to : Impaired vision - -  Follow up Education provided - -   Depression Screen PHQ 2/9 Scores 02/14/2015 07/06/2014 08/17/2012 08/15/2011  PHQ - 2 Score 4 0 0 0  PHQ- 9 Score 10 - - -    Cognitive Testing MMSE - Mini Mental State Exam 02/14/2015  Orientation to time 5  Orientation to Place 5  Registration 3  Attention/ Calculation 4  Recall 2  Language- name 2 objects 2  Language- repeat 1  Language- follow 3 step command 3  Language- read & follow direction 1  Write a sentence 1  Copy design 1  Total score 28    Immunization History  Administered Date(s) Administered  . Influenza Whole 04/04/2010, 06/17/2012  . Influenza,inj,Quad PF,36+ Mos 03/24/2014  . Pneumococcal Conjugate-13 02/14/2015  . Pneumococcal Polysaccharide-23 08/15/2011   Screening Tests Health Maintenance  Topic Date Due  . INFLUENZA VACCINE  01/16/2015  . FOOT EXAM  03/18/2015 (Originally 08/17/2013)  . OPHTHALMOLOGY EXAM  03/18/2015 (Originally 04/29/1951)  . URINE MICROALBUMIN  03/18/2015 (Originally 04/29/1951)  . ZOSTAVAX  03/18/2015  (Originally 04/29/2002)  . HEMOGLOBIN A1C  04/14/2015 (Originally 01/04/2015)  . COLONOSCOPY  04/14/2015 (Originally 08/29/2014)  . TETANUS/TDAP  06/17/2017  . PNA vac Low Risk Adult  Completed    Plan:  Schedule physical appointment and lab work with Dr. Birdie Riddle.  Scheduled eye exam and  Colonoscopy.    Try to eat three meal per day.  Include lean protein, fruits/veggies, and whole grains.  Maybe try adding Glucerna.    Increase physical activity.   Weight bearing exercises such as walking.  Stop whenever  you experience chest pain or shortness of breath.    Try to stay active and interact with others.      During the course of the visit the patient was educated and counseled about the following appropriate screening and preventive services:   Vaccines to include Pneumoccal, Influenza, Hepatitis B, Td, Zostavax, HCV  Electrocardiogram  Cardiovascular Disease  Colorectal cancer screening  Diabetes screening  Prostate Cancer Screening  Glaucoma screening  Nutrition counseling   Smoking cessation counseling  Patient Instructions (the written plan) was given to the patient.   Rudene Anda, RN  02/14/2015

## 2015-02-14 NOTE — Addendum Note (Signed)
Addended by: Rudene Anda on: 02/14/2015 01:43 PM   Modules accepted: Orders

## 2015-02-14 NOTE — Progress Notes (Signed)
Pre visit review using our clinic review tool, if applicable. No additional management support is needed unless otherwise documented below in the visit note. 

## 2015-02-14 NOTE — Progress Notes (Signed)
   Subjective:    Patient ID: Tyler Hahn, male    DOB: 24-Jun-1940, 74 y.o.   MRN: [id number]  HPI Agree w/ documentation as written by RN   Review of Systems     Objective:   Physical Exam        Assessment & Plan:  F/U w/ PCP for CPE

## 2015-02-16 ENCOUNTER — Encounter: Payer: Self-pay | Admitting: Cardiology

## 2015-03-02 ENCOUNTER — Encounter: Payer: Self-pay | Admitting: Internal Medicine

## 2015-03-07 ENCOUNTER — Other Ambulatory Visit: Payer: Self-pay | Admitting: Family Medicine

## 2015-03-08 NOTE — Telephone Encounter (Signed)
Medication filled to pharmacy as requested.   

## 2015-04-06 ENCOUNTER — Ambulatory Visit (INDEPENDENT_AMBULATORY_CARE_PROVIDER_SITE_OTHER): Payer: Medicare HMO | Admitting: Endocrinology

## 2015-04-06 ENCOUNTER — Encounter: Payer: Self-pay | Admitting: Endocrinology

## 2015-04-06 VITALS — BP 132/82 | HR 67 | Temp 97.9°F | Resp 16 | Ht 67.5 in | Wt 184.0 lb

## 2015-04-06 DIAGNOSIS — E1122 Type 2 diabetes mellitus with diabetic chronic kidney disease: Secondary | ICD-10-CM

## 2015-04-06 DIAGNOSIS — E0590 Thyrotoxicosis, unspecified without thyrotoxic crisis or storm: Secondary | ICD-10-CM

## 2015-04-06 LAB — POCT GLYCOSYLATED HEMOGLOBIN (HGB A1C): HEMOGLOBIN A1C: 6.8

## 2015-04-06 NOTE — Patient Instructions (Addendum)
blood tests are requested for you today.  We'll let you know about the results. With time, we may need to restart the methimazole.   Please come back for a follow-up appointment in 3 months.

## 2015-04-06 NOTE — Progress Notes (Signed)
Pre visit review using our clinic review tool, if applicable. No additional management support is needed unless otherwise documented below in the visit note. 

## 2015-04-06 NOTE — Progress Notes (Signed)
Subjective:    Patient ID: Tyler Hahn, male    DOB: 1940-08-23, 74 y.o.   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  Pt says he never misses the tapazole.  Past Medical History  Diagnosis Date  . Hypertension   . Hyperlipidemia   . Hyperthyroidism   . Gout   . Renal insufficiency   . PONV (postoperative nausea and vomiting)   . CHF (congestive heart failure) ([place])     2000  . Anxiety   . Automatic implantable cardioverter-defibrillator in situ     greg taylor  . Diabetes mellitus     no meds  . Fatty liver   . Kidney cysts     Past Surgical History  Procedure Laterality Date  . Cardiac defibrillator placement    . Icd,[facility] scent[facility]    . Polyp removal throat      difficulty speaking  . Enteroscopy N/A 11/25/2013    Procedure: ENTEROSCOPY;  Surgeon: Ladene Artist, MD;  [facility];  Service: Endoscopy;  Laterality: N/A;  . Diagnostic laparoscopy  01/27/2014    Dr Brantley Stage  . Laparoscopy N/A 01/27/2014    Procedure: LAPAROSCOPY DIAGNOSTIC;  Surgeon: Joyice Faster. Cornett, MD;  Location: [facility];  Service: General;  Laterality: N/A;    Social History   Social History  . Marital Status: Married    Spouse Name: N/A  . Number of Children: 4  . Years of Education: N/A   Occupational History  . Retired    Social History Main Topics  . Smoking status: Former Smoker    Types: Cigarettes    Quit date: 11/26/1998  . Smokeless tobacco: Never Used  . Alcohol Use: No     Comment: quit  2008  . Drug Use: No  . Sexual Activity: Not on file   Other Topics Concern  . Not on file   Social History Narrative    Current  Outpatient Prescriptions on File Prior to Visit  Medication Sig Dispense Refill  . allopurinol (ZYLOPRIM) 100 MG tablet Take 100 mg by mouth daily.    Marland Kitchen atorvastatin (LIPITOR) 20 MG tablet Take 1 tablet (20 mg total) by mouth daily. 90 tablet 1  . calcitRIOL (ROCALTROL) 0.5 MCG capsule Take 0.5 mcg by mouth daily.    . carvedilol (COREG) 12.5 MG tablet Take 1 tablet (12.5 mg total) by mouth 2 (two) times daily. 180 tablet 1  . lisinopril (PRINIVIL,ZESTRIL) 20 MG tablet Take 1 tablet (20 mg total) by mouth daily. 90 tablet 1  . Omega 3 1200 MG CAPS Take 1,200 mg by mouth daily.    Marland Kitchen oxyCODONE-acetaminophen (PERCOCET/ROXICET) 5-325 MG per tablet Take 1-2 tablets by mouth every 6 (six) hours as needed for severe pain. 40 tablet 0  . sertraline (ZOLOFT) 25 MG tablet TAKE ONE TABLET BY MOUTH ONCE DAILY 30 tablet 6  . torsemide (DEMADEX) 20 MG tablet Take 1 tablet (20 mg total) by mouth daily. 30 tablet 3  . erythromycin ophthalmic ointment Place 1 application into both eyes at bedtime.     . [DISCONTINUED] atenolol (TENORMIN) 50 MG tablet Take 75 mg by mouth daily.      . [  DISCONTINUED] Insulin Glargine (LANTUS SOLOSTAR [place]) Inject into the skin as directed.       No current facility-administered medications on file prior to visit.    Allergies  Allergen Reactions  . Sulfonamide Derivatives Rash    REACTION: itching/rash     Family History  Problem Relation Age of Onset  . Stomach cancer Mother   . Diabetes Father   . Heart disease Father   . Liver disease Father   . Kidney disease Father   . Alzheimer's disease Mother     BP 132/82 mmHg  Pulse 67  Temp(Src) 97.9 F (36.6 C) (Oral)  Resp 16  Ht 5' 7.5" (1.715 m)  Wt 184 lb (83.462 kg)  BMI 28.38 kg/m2  SpO2 98%    Review of Systems Denies weight change.      Objective:   Physical Exam VITAL SIGNS:  See vs page GENERAL: no distress NECK: There is no palpable thyroid enlargement.  No thyroid nodule is palpable.  No  palpable lymphadenopathy at the anterior neck.     Lab Results  Component Value Date   TSH 3.83 04/06/2015   T3TOTAL 2.2 08/17/2009   T4TOTAL 6.7 08/17/2009      Assessment & Plan:  Hyperthyroidism, normal off rx now, but he is at risk for recurrence  Patient is advised the following: Patient Instructions  blood tests are requested for you today.  We'll let you know about the results. With time, we may need to restart the methimazole.   Please come back for a follow-up appointment in 3 months.

## 2015-04-07 LAB — T4, FREE: Free T4: 0.73 ng/dL (ref 0.60–1.60)

## 2015-04-07 LAB — TSH: TSH: 3.83 u[IU]/mL (ref 0.35–4.50)

## 2015-04-18 ENCOUNTER — Encounter: Payer: Self-pay | Admitting: *Deleted

## 2015-05-02 DIAGNOSIS — N184 Chronic kidney disease, stage 4 (severe): Secondary | ICD-10-CM | POA: Diagnosis not present

## 2015-05-04 ENCOUNTER — Telehealth: Payer: Self-pay | Admitting: Family Medicine

## 2015-05-04 NOTE — Telephone Encounter (Signed)
Relation to [id number] Call back [phone] Pharmacy: [facility] [address] ([address] [place] [phone] (Phone) [phone] (Fax)         Reason for call:  Patient requesting "gout" medication refill, patient states he is completely out and has a physical appointment for 07/18/14.

## 2015-05-05 ENCOUNTER — Ambulatory Visit (INDEPENDENT_AMBULATORY_CARE_PROVIDER_SITE_OTHER): Payer: Medicare HMO | Admitting: Internal Medicine

## 2015-05-05 ENCOUNTER — Encounter: Payer: Self-pay | Admitting: Internal Medicine

## 2015-05-05 VITALS — BP 124/80 | HR 65 | Ht 67.0 in | Wt 187.8 lb

## 2015-05-05 DIAGNOSIS — I5022 Chronic systolic (congestive) heart failure: Secondary | ICD-10-CM

## 2015-05-05 DIAGNOSIS — Z95810 Presence of automatic (implantable) cardiac defibrillator: Secondary | ICD-10-CM

## 2015-05-05 LAB — CUP PACEART INCLINIC DEVICE CHECK
HighPow Impedance: 34 Ohm
HighPow Impedance: 50 Ohm
Implantable Lead Implant Date: 20091218
Implantable Lead Implant Date: 20091218
Implantable Lead Location: 753860
Implantable Lead Model: 158
Implantable Lead Serial Number: 131093
Lead Channel Impedance Value: 427 Ohm
Lead Channel Pacing Threshold Amplitude: 0.5 V
Lead Channel Pacing Threshold Pulse Width: 0.5 ms
Lead Channel Pacing Threshold Pulse Width: 0.5 ms
Lead Channel Setting Pacing Amplitude: 2 V
Lead Channel Setting Pacing Amplitude: 2.4 V
Lead Channel Setting Pacing Pulse Width: 0.5 ms
MDC IDC LEAD LOCATION: 753859
MDC IDC MSMT LEADCHNL RA PACING THRESHOLD AMPLITUDE: 0.7 V
MDC IDC MSMT LEADCHNL RA SENSING INTR AMPL: 3.1 mV
MDC IDC MSMT LEADCHNL RV IMPEDANCE VALUE: 614 Ohm
MDC IDC MSMT LEADCHNL RV SENSING INTR AMPL: 8.7 mV
MDC IDC PG SERIAL: 131093
MDC IDC SESS DTM: 20161118050000
MDC IDC SET LEADCHNL RV SENSING SENSITIVITY: 0.6 mV
MDC IDC STAT BRADY RA PERCENT PACED: 2 %
MDC IDC STAT BRADY RV PERCENT PACED: 1 % — AB

## 2015-05-05 MED ORDER — ALLOPURINOL 100 MG PO TABS: 100.0000 mg | ORAL_TABLET | Freq: Every day | ORAL | Status: DC

## 2015-05-05 NOTE — Patient Instructions (Signed)
Medication Instructions: - no changes  Labwork: - none  Procedures/Testing: - none  Follow-Up: - Your physician wants you to follow-up in: 6 months with [name], PA for Dr. Caryl Comes & 1 year with Dr. Caryl Comes. You will receive a reminder letter in the mail two months in advance. If you don't receive a letter, please call our office to schedule the follow-up appointment.  Any Additional Special Instructions Will Be Listed Below (If Applicable).

## 2015-05-05 NOTE — Telephone Encounter (Signed)
Medication filled to pharmacy as requested.   

## 2015-05-05 NOTE — Progress Notes (Signed)
Patient Care Team: [name], MD as PCP - General Rexene Agent, MD as Attending Physician (Nephrology) [name], MD as Consulting Physician (Cardiology) Renato Shin, MD as Consulting Physician (Endocrinology)   HPI  Tyler Hahn is a 74 y.o. 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     He denies significant shortness of breath or peripheral edema; he has no orthopnea or nocturnal dyspnea.  His blood pressure home runs in the 130-140 range He has renal insufficiency;  creatinine was actually down to 2.410/15  He had been caring for his wife; she is now at [facility]  This is been a challenge for both of them.     Past Medical History  Diagnosis Date  . Hypertension   . Hyperlipidemia   . Hyperthyroidism   . Gout   . Renal insufficiency   . PONV (postoperative nausea and vomiting)   . CHF (congestive heart failure) ([place])     2000  . Anxiety   . Automatic implantable cardioverter-defibrillator in situ     greg taylor  . Diabetes mellitus     no meds  . Fatty liver   . Kidney cysts     Past Surgical History  Procedure Laterality Date  . Cardiac defibrillator placement    . Icd,[place] scentific    . Polyp removal throat      difficulty speaking  . Enteroscopy N/A 11/25/2013    Procedure: ENTEROSCOPY;  Surgeon: Ladene Artist, MD;  [facility];  Service: Endoscopy;  Laterality: N/A;  . Diagnostic laparoscopy  01/27/2014    Dr Brantley Stage  . Laparoscopy N/A 01/27/2014    Procedure: LAPAROSCOPY DIAGNOSTIC;  Surgeon: Joyice Faster. Cornett, MD;  Location: [facility];  Service: General;  Laterality: N/A;    Current Outpatient Prescriptions  Medication Sig Dispense Refill  . allopurinol (ZYLOPRIM) 100 MG tablet Take 1 tablet (100 mg total) by mouth daily. 30 tablet 3  . atorvastatin (LIPITOR) 20 MG tablet Take 1 tablet (20 mg total) by mouth daily. 90 tablet 1  . calcitRIOL (ROCALTROL) 0.5  MCG capsule Take 0.5 mcg by mouth daily.    . carvedilol (COREG) 12.5 MG tablet Take 1 tablet (12.5 mg total) by mouth 2 (two) times daily. 180 tablet 1  . lisinopril (PRINIVIL,ZESTRIL) 20 MG tablet Take 1 tablet (20 mg total) by mouth daily. 90 tablet 1  . Omega 3 1200 MG CAPS Take 1,200 mg by mouth daily.    Marland Kitchen oxyCODONE-acetaminophen (PERCOCET/ROXICET) 5-325 MG per tablet Take 1-2 tablets by mouth every 6 (six) hours as needed for severe pain. 40 tablet 0  . sertraline (ZOLOFT) 25 MG tablet TAKE ONE TABLET BY MOUTH ONCE DAILY 30 tablet 6  . torsemide (DEMADEX) 20 MG tablet Take 1 tablet (20 mg total) by mouth daily. 30 tablet 3  . [DISCONTINUED] atenolol (TENORMIN) 50 MG tablet Take 75 mg by mouth daily.      . [DISCONTINUED] Insulin Glargine (LANTUS SOLOSTAR [place]) Inject into the skin as directed.       No current facility-administered medications for this visit.    Allergies  Allergen Reactions  . Sulfonamide Derivatives Rash    REACTION: itching/rash     Review of Systems negative except from HPI and PMH  Physical Exam BP 124/80 mmHg  Pulse 65  Ht 5\' 7"  (1.702 m)  Wt 187 lb 12.8 oz (85.186 kg)  BMI 29.41 kg/m2 Well developed and well nourished  in no acute distress HENT normal E scleral and icterus clear Neck Supple JVP flat; carotids brisk and full Clear to ausculation regular rate and rhythm Early systolic   murmurs no rub Device pocket well healed; without hematoma or erythema.  There is no tethering  Soft with active bowel sounds No clubbing cyanosis none Edema  Swelling of his left wrist Alert and oriented, grossly normal motor and sensory function Skin Warm and Dry  ecg sinus at 65 19/11/45 TWI 1,L V4-6  Assessment and  Plan  Nonischemic cardiomyopathy  Continue Guideline directed medical therapy  Congestive heart failure-chronic-systolic   Euvolemic continue current meds  Hypertension we will increase his carvedilol to 6.25--12.5  vt-NS  Detected on his  device  Implantable defibrillator-[employer] dual-chamber The patient's device was interrogated.  The information was reviewed. No changes were made in the programming.   11  Renal insufficiency grade 4  Gout-acute  He has had persistent left ventricular dysfunction most recently evaluated 2014. For medication point review renal function being the dominant determinant of options. He is to see his nephrologist later this month; labs of already been drawn. At that point we will have to make a decision regarding lisinopril perhaps versus BiDil. At this point it would be contraindicated to use of Aldactone  We will use colchicine for his gout no renal dosing adjustments is recommended

## 2015-05-08 ENCOUNTER — Telehealth: Payer: Self-pay | Admitting: Family Medicine

## 2015-05-08 ENCOUNTER — Other Ambulatory Visit: Payer: Self-pay | Admitting: General Practice

## 2015-05-08 MED ORDER — COLCHICINE 0.6 MG PO TABS: 0.6000 mg | ORAL_TABLET | Freq: Every day | ORAL | Status: AC

## 2015-05-08 NOTE — Telephone Encounter (Signed)
Pharmacy: [facility] [place] ([place] [place]  Reason for call: pt calling for refill on medicine for gout. He is having a flare up and hasn't had one in a while.

## 2015-05-08 NOTE — Telephone Encounter (Signed)
Medication filled to pharmacy as requested.   

## 2015-05-09 DIAGNOSIS — M109 Gout, unspecified: Secondary | ICD-10-CM | POA: Diagnosis not present

## 2015-05-17 ENCOUNTER — Telehealth: Payer: Self-pay

## 2015-05-17 DIAGNOSIS — E872 Acidosis: Secondary | ICD-10-CM | POA: Diagnosis not present

## 2015-05-17 DIAGNOSIS — N2581 Secondary hyperparathyroidism of renal origin: Secondary | ICD-10-CM | POA: Diagnosis not present

## 2015-05-17 DIAGNOSIS — N184 Chronic kidney disease, stage 4 (severe): Secondary | ICD-10-CM | POA: Diagnosis not present

## 2015-05-17 DIAGNOSIS — D631 Anemia in chronic kidney disease: Secondary | ICD-10-CM | POA: Diagnosis not present

## 2015-05-17 DIAGNOSIS — I10 Essential (primary) hypertension: Secondary | ICD-10-CM | POA: Diagnosis not present

## 2015-05-17 NOTE — Telephone Encounter (Signed)
Pre-Visit call made to patient. Left message for return call. 

## 2015-05-18 ENCOUNTER — Encounter: Payer: Self-pay | Admitting: Family Medicine

## 2015-05-18 ENCOUNTER — Ambulatory Visit (INDEPENDENT_AMBULATORY_CARE_PROVIDER_SITE_OTHER): Payer: Medicare HMO | Admitting: Family Medicine

## 2015-05-18 VITALS — BP 122/82 | HR 80 | Temp 97.9°F | Resp 16 | Ht 67.0 in | Wt 187.5 lb

## 2015-05-18 DIAGNOSIS — I10 Essential (primary) hypertension: Secondary | ICD-10-CM | POA: Diagnosis not present

## 2015-05-18 DIAGNOSIS — N184 Chronic kidney disease, stage 4 (severe): Secondary | ICD-10-CM | POA: Diagnosis not present

## 2015-05-18 DIAGNOSIS — Z0000 Encounter for general adult medical examination without abnormal findings: Secondary | ICD-10-CM

## 2015-05-18 DIAGNOSIS — E1122 Type 2 diabetes mellitus with diabetic chronic kidney disease: Secondary | ICD-10-CM

## 2015-05-18 DIAGNOSIS — Z23 Encounter for immunization: Secondary | ICD-10-CM | POA: Diagnosis not present

## 2015-05-18 DIAGNOSIS — I429 Cardiomyopathy, unspecified: Secondary | ICD-10-CM

## 2015-05-18 DIAGNOSIS — Z1211 Encounter for screening for malignant neoplasm of colon: Secondary | ICD-10-CM

## 2015-05-18 DIAGNOSIS — E785 Hyperlipidemia, unspecified: Secondary | ICD-10-CM

## 2015-05-18 DIAGNOSIS — Z125 Encounter for screening for malignant neoplasm of prostate: Secondary | ICD-10-CM | POA: Diagnosis not present

## 2015-05-18 DIAGNOSIS — E0590 Thyrotoxicosis, unspecified without thyrotoxic crisis or storm: Secondary | ICD-10-CM

## 2015-05-18 NOTE — Progress Notes (Signed)
   Subjective:    Patient ID: Tyler Hahn, male    DOB: December 27, 1940, 74 y.o.   MRN: [id number]  HPI CPE- pt had wellness visit in August.  Due for flu shot, colonoscopy (last done 2006 in [place]), eye exam.  DM- chronic problem, following w/ Dr Loanne Drilling.  Last A1C 6.8.  On Lisinopril for renal protection and following w/ [facility].  Renal insufficiency- following w/ Dr Joelyn Oms at [facility]  HTN- chronic problem, on [place], Lisinopril, Torsemide w/ good control.  Hyperlipidemia- chronic problem, on Lipitor.   Review of Systems Patient reports no vision/hearing changes, anorexia, fever ,adenopathy, persistant/recurrent hoarseness, swallowing issues, chest pain, palpitations, edema, persistant/recurrent cough, hemoptysis, dyspnea (rest,exertional, paroxysmal nocturnal), gastrointestinal  bleeding (melena, rectal bleeding), abdominal pain, excessive heart burn, GU symptoms (dysuria, hematuria, voiding/incontinence issues) syncope, focal weakness, memory loss, numbness & tingling, skin/hair/nail changes, depression, anxiety, abnormal bruising/bleeding, musculoskeletal symptoms/signs.     Objective:   Physical Exam General Appearance:    Alert, cooperative, no distress, appears stated age  Head:    Normocephalic, without obvious abnormality, atraumatic  Eyes:    PERRL, conjunctiva/corneas clear, EOM's intact, fundi    benign, both eyes       Ears:    Normal TM's and external ear canals, both ears  Nose:   Nares normal, septum midline, mucosa normal, no drainage   or sinus tenderness  Throat:   Lips, mucosa, and tongue normal; teeth and gums normal  Neck:   Supple, symmetrical, trachea midline, no adenopathy;       thyroid:  No enlargement/tenderness/nodules  Back:     Symmetric, no curvature, ROM normal, no CVA tenderness  Lungs:     Clear to auscultation bilaterally, respirations unlabored  Chest wall:    No tenderness or deformity  Heart:    Regular rate and rhythm, S1  and S2 normal, no murmur, rub   or gallop  Abdomen:     Soft, non-tender, bowel sounds active all four quadrants,    no masses, no organomegaly  Genitalia:    Deferred at pt's request  Rectal:    Extremities:   Extremities normal, atraumatic, no cyanosis or edema  Pulses:   2+ and symmetric all extremities  Skin:   Skin color, texture, turgor normal, no rashes or lesions  Lymph nodes:   Cervical, supraclavicular, and axillary nodes normal  Neurologic:   CNII-XII intact. Normal strength, sensation and reflexes      throughout          Assessment & Plan:

## 2015-05-18 NOTE — Patient Instructions (Signed)
Follow up in 4 months to recheck diabetes We'll notify you of your lab results and make any changes if needed We'll call you with your GI appt for the colonoscopy and your eye apppt Keep up the good work on healthy diet and regular exercise- you look great! Call with any questions or concerns If you want to join [place] at the new [place] office, any scheduled appointments will automatically transfer and we will see you at [address] [place] [address] [place], [place] [address] (OPENING 06/20/15) Happy Holidays!!!

## 2015-05-18 NOTE — Progress Notes (Signed)
Pre visit review using our clinic review tool, if applicable. No additional management support is needed unless otherwise documented below in the visit note. 

## 2015-05-19 ENCOUNTER — Encounter: Payer: Self-pay | Admitting: Gastroenterology

## 2015-05-19 ENCOUNTER — Encounter: Payer: Self-pay | Admitting: Family Medicine

## 2015-05-19 LAB — CBC WITH DIFFERENTIAL/PLATELET
BASOS PCT: 0.6 % (ref 0.0–3.0)
Basophils Absolute: 0 10*3/uL (ref 0.0–0.1)
EOS ABS: 0.3 10*3/uL (ref 0.0–0.7)
Eosinophils Relative: 5.3 % — ABNORMAL HIGH (ref 0.0–5.0)
HEMATOCRIT: 32.8 % — AB (ref 39.0–52.0)
HEMOGLOBIN: 10.6 g/dL — AB (ref 13.0–17.0)
Lymphocytes Relative: 22.8 % (ref 12.0–46.0)
Lymphs Abs: 1.3 10*3/uL (ref 0.7–4.0)
MCHC: 32.4 g/dL (ref 30.0–36.0)
MCV: 85.2 fl (ref 78.0–100.0)
MONOS PCT: 9.1 % (ref 3.0–12.0)
Monocytes Absolute: 0.5 10*3/uL (ref 0.1–1.0)
NEUTROS ABS: 3.5 10*3/uL (ref 1.4–7.7)
Neutrophils Relative %: 62.2 % (ref 43.0–77.0)
Platelets: 189 10*3/uL (ref 150.0–400.0)
RBC: 3.86 Mil/uL — ABNORMAL LOW (ref 4.22–5.81)
RDW: 14.6 % (ref 11.5–15.5)
WBC: 5.7 10*3/uL (ref 4.0–10.5)

## 2015-05-19 LAB — HEPATIC FUNCTION PANEL
ALT: 11 U/L (ref 0–53)
AST: 14 U/L (ref 0–37)
Albumin: 3.6 g/dL (ref 3.5–5.2)
Alkaline Phosphatase: 92 U/L (ref 39–117)
Bilirubin, Direct: 0.1 mg/dL (ref 0.0–0.3)
Total Bilirubin: 0.2 mg/dL (ref 0.2–1.2)
Total Protein: 7.4 g/dL (ref 6.0–8.3)

## 2015-05-19 LAB — LIPID PANEL
CHOLESTEROL: 178 mg/dL (ref 0–200)
HDL: 36.2 mg/dL — AB (ref >39.00)
NonHDL: 142.2
TRIGLYCERIDES: 312 mg/dL — AB (ref 0.0–149.0)
Total CHOL/HDL Ratio: 5
VLDL: 62.4 mg/dL — ABNORMAL HIGH (ref 0.0–40.0)

## 2015-05-19 LAB — LDL CHOLESTEROL, DIRECT: LDL DIRECT: 108 mg/dL

## 2015-05-19 LAB — PSA, MEDICARE: PSA: 0.86 ng/ml (ref 0.10–4.00)

## 2015-05-20 ENCOUNTER — Other Ambulatory Visit: Payer: Self-pay | Admitting: General Practice

## 2015-05-20 MED ORDER — FENOFIBRATE 160 MG PO TABS: 160.0000 mg | ORAL_TABLET | Freq: Every day | ORAL | Status: DC

## 2015-05-21 NOTE — Assessment & Plan Note (Signed)
Chronic problem.  Following w/ Dr Loanne Drilling.  Will follow along and assist as able.

## 2015-05-21 NOTE — Assessment & Plan Note (Signed)
Pt's PE unchanged from previous.  Due for colonoscopy- referral entered.  Pt declined GU exam today- will get PSA.  Check labs that were not recently done (previous labs reviewed).  Immunizations UTD.  Anticipatory guidance provided.

## 2015-05-21 NOTE — Assessment & Plan Note (Signed)
Chronic problem.  Tolerating statin w/o difficulty.  Check labs.  Adjust meds prn  

## 2015-05-21 NOTE — Assessment & Plan Note (Signed)
Chronic problem.  Due to previous hx of alcohol use.  Following w/ Cards.  On beta blocker.  Will follow along and assist as able.

## 2015-05-21 NOTE — Assessment & Plan Note (Signed)
Chronic problem.  Following w/ Dr Joelyn Oms regularly.  Reviewed recent labs.

## 2015-05-21 NOTE — Assessment & Plan Note (Signed)
Chronic problem.  Currently diet controlled.  Due for eye exam- referral made.  On ACE for renal protection.  Following w/ Dr Loanne Drilling.  Will follow along and assist as able.

## 2015-05-21 NOTE — Assessment & Plan Note (Signed)
Chronic problem.  Well controlled.  Asymptomatic.  Reviewed recent labs.  No med changes at this time.

## 2015-07-05 ENCOUNTER — Encounter: Payer: Self-pay | Admitting: Gastroenterology

## 2015-07-05 ENCOUNTER — Telehealth: Payer: Self-pay | Admitting: *Deleted

## 2015-07-05 NOTE — Telephone Encounter (Signed)
Patient is for recall screening colon on 07-24-15. Last colon was 2006. He is known to you, pt had office visit on 09-05-14. After reviewing his chart for pre-visit last echo. I could fine was on 09-17-2012 which shows EF 20-25%. Patient does have multiple health concerns related to his heart and kidneys. Would you like patient to have office visit with you before colonoscopy or direct hospital ok? Please advise. Thank you, Acacia Latorre,pv.

## 2015-07-05 NOTE — Telephone Encounter (Signed)
Spoke with patient. Notified patient of Dr.Stark's recommendations. Office visit appointment made for 09/11/15 at 2:30 pm. Pre-visit and colonoscopy appointments cancelled. Patient aware.

## 2015-07-05 NOTE — Telephone Encounter (Signed)
Yes, office visit to assess and discuss CRC screening options

## 2015-07-07 ENCOUNTER — Ambulatory Visit: Payer: Medicare HMO | Admitting: Endocrinology

## 2015-07-24 ENCOUNTER — Encounter: Payer: Medicare HMO | Admitting: Gastroenterology

## 2015-08-07 ENCOUNTER — Ambulatory Visit (INDEPENDENT_AMBULATORY_CARE_PROVIDER_SITE_OTHER): Payer: Medicare HMO | Admitting: *Deleted

## 2015-08-07 DIAGNOSIS — Z95810 Presence of automatic (implantable) cardiac defibrillator: Secondary | ICD-10-CM | POA: Diagnosis not present

## 2015-08-07 DIAGNOSIS — I429 Cardiomyopathy, unspecified: Secondary | ICD-10-CM | POA: Diagnosis not present

## 2015-08-07 DIAGNOSIS — I428 Other cardiomyopathies: Secondary | ICD-10-CM

## 2015-08-09 NOTE — Progress Notes (Signed)
Remote ICD transmission.   

## 2015-08-20 ENCOUNTER — Encounter: Payer: Self-pay | Admitting: Cardiology

## 2015-08-20 LAB — CUP PACEART REMOTE DEVICE CHECK
Battery Remaining Longevity: 60 mo
Battery Remaining Percentage: 73 %
HighPow Impedance: 48 Ohm
Implantable Lead Implant Date: 20091218
Implantable Lead Location: 753860
Implantable Lead Model: 158
Implantable Lead Serial Number: 131093
Lead Channel Pacing Threshold Pulse Width: 0.5 ms
Lead Channel Setting Pacing Amplitude: 2.4 V
Lead Channel Setting Pacing Pulse Width: 0.5 ms
MDC IDC LEAD IMPLANT DT: 20091218
MDC IDC LEAD LOCATION: 753859
MDC IDC MSMT LEADCHNL RA IMPEDANCE VALUE: 374 Ohm
MDC IDC MSMT LEADCHNL RA PACING THRESHOLD AMPLITUDE: 0.7 V
MDC IDC MSMT LEADCHNL RV IMPEDANCE VALUE: 515 Ohm
MDC IDC MSMT LEADCHNL RV PACING THRESHOLD AMPLITUDE: 0.5 V
MDC IDC MSMT LEADCHNL RV PACING THRESHOLD PULSEWIDTH: 0.5 ms
MDC IDC SESS DTM: 20170220053200
MDC IDC SET LEADCHNL RA PACING AMPLITUDE: 2 V
MDC IDC SET LEADCHNL RV SENSING SENSITIVITY: 0.6 mV
MDC IDC STAT BRADY RA PERCENT PACED: 2 %
MDC IDC STAT BRADY RV PERCENT PACED: 0 %
Pulse Gen Serial Number: 131093

## 2015-08-20 NOTE — Progress Notes (Signed)
Normal remote reviewed.  Next [place] 11/06/15

## 2015-08-21 DIAGNOSIS — N184 Chronic kidney disease, stage 4 (severe): Secondary | ICD-10-CM | POA: Diagnosis not present

## 2015-08-21 DIAGNOSIS — E875 Hyperkalemia: Secondary | ICD-10-CM | POA: Diagnosis not present

## 2015-08-21 DIAGNOSIS — N2581 Secondary hyperparathyroidism of renal origin: Secondary | ICD-10-CM | POA: Diagnosis not present

## 2015-08-21 DIAGNOSIS — E872 Acidosis: Secondary | ICD-10-CM | POA: Diagnosis not present

## 2015-08-21 DIAGNOSIS — I10 Essential (primary) hypertension: Secondary | ICD-10-CM | POA: Diagnosis not present

## 2015-09-11 ENCOUNTER — Ambulatory Visit (INDEPENDENT_AMBULATORY_CARE_PROVIDER_SITE_OTHER): Payer: Medicare HMO | Admitting: Gastroenterology

## 2015-09-11 ENCOUNTER — Encounter: Payer: Self-pay | Admitting: Gastroenterology

## 2015-09-11 VITALS — BP 120/68 | HR 70 | Ht 67.0 in | Wt 180.0 lb

## 2015-09-11 DIAGNOSIS — Z1211 Encounter for screening for malignant neoplasm of colon: Secondary | ICD-10-CM

## 2015-09-11 DIAGNOSIS — I429 Cardiomyopathy, unspecified: Secondary | ICD-10-CM | POA: Diagnosis not present

## 2015-09-11 DIAGNOSIS — I420 Dilated cardiomyopathy: Secondary | ICD-10-CM

## 2015-09-11 MED ORDER — NA SULFATE-K SULFATE-MG SULF 17.5-3.13-1.6 GM/177ML PO SOLN: 1.0000 | Freq: Once | ORAL | Status: DC

## 2015-09-11 NOTE — Patient Instructions (Signed)
You have been scheduled for a colonoscopy. Please follow written instructions given to you at your visit today.  Please pick up your prep supplies at the pharmacy within the next 1-3 days. If you use inhalers (even only as needed), please bring them with you on the day of your procedure.  Thank you for choosing me and [facility].  Malcolm T. Stark, Jr., MD., FACG  

## 2015-09-11 NOTE — Progress Notes (Signed)
    History of Present Illness: This is a 75 year old male with cardiomyopathy and ICD presenting for colon cancer screening. He previously underwent colonoscopy in 2006 at [facility] showing small non-precancerous colon polyps. The exam is otherwise unremarkable. His last echocardiogram was performed in April 2014 showing an ejection fraction of 20-25%. He had a follow-up appointment with Dr. Caryl Comes of cardiology in November 2016. Denies weight loss, abdominal pain, constipation, diarrhea, change in stool caliber, melena, hematochezia, nausea, vomiting, dysphagia, reflux symptoms, chest pain.  Current Medications, Allergies, Past Medical History, Past Surgical History, Family History and Social History were reviewed in [employer] record.  Physical Exam: General: Well developed, well nourished, no acute distress Head: Normocephalic and atraumatic Eyes:  sclerae anicteric, EOMI Ears: Normal auditory acuity Mouth: No deformity or lesions Lungs: Clear throughout to auscultation Heart: Regular rate and rhythm; no murmurs, rubs or bruits Abdomen: Soft, non tender and non distended. No masses, hepatosplenomegaly or hernias noted. Normal Bowel sounds Rectal: Deferred to colonoscopy Musculoskeletal: Symmetrical with no gross deformities  Pulses:  Normal pulses noted Extremities: No clubbing, cyanosis, edema or deformities noted Neurological: Alert oriented x 4, grossly nonfocal Psychological:  Alert and cooperative. Normal mood and affect  Assessment and Recommendations:  1. Colorectal cancer screening, average risk. He has a slightly increased risk of cardiopulmonary complications with colonoscopy and sedation due to his cardiomyopathy with ejection fraction 20-25%. The risks (including bleeding, perforation, infection, missed lesions, medication reactions and possible hospitalization or surgery if complications occur), benefits, and alternatives to colonoscopy  with possible biopsy and possible polypectomy were discussed with the patient and they consent to proceed.

## 2015-09-18 ENCOUNTER — Encounter: Payer: Self-pay | Admitting: Family Medicine

## 2015-09-18 ENCOUNTER — Ambulatory Visit (INDEPENDENT_AMBULATORY_CARE_PROVIDER_SITE_OTHER): Payer: Medicare HMO | Admitting: Family Medicine

## 2015-09-18 VITALS — BP 122/74 | HR 74 | Temp 98.0°F | Resp 16 | Ht 67.0 in | Wt 176.5 lb

## 2015-09-18 DIAGNOSIS — E1122 Type 2 diabetes mellitus with diabetic chronic kidney disease: Secondary | ICD-10-CM | POA: Diagnosis not present

## 2015-09-18 DIAGNOSIS — N184 Chronic kidney disease, stage 4 (severe): Secondary | ICD-10-CM | POA: Diagnosis not present

## 2015-09-18 DIAGNOSIS — E0590 Thyrotoxicosis, unspecified without thyrotoxic crisis or storm: Secondary | ICD-10-CM

## 2015-09-18 LAB — TSH: TSH: 2.56 u[IU]/mL (ref 0.35–4.50)

## 2015-09-18 LAB — T3, FREE: T3 FREE: 2.4 pg/mL (ref 2.3–4.2)

## 2015-09-18 LAB — HEMOGLOBIN A1C: Hgb A1c MFr Bld: 7 % — ABNORMAL HIGH (ref 4.6–6.5)

## 2015-09-18 LAB — BASIC METABOLIC PANEL
BUN: 47 mg/dL — AB (ref 6–23)
CHLORIDE: 112 meq/L (ref 96–112)
CO2: 19 meq/L (ref 19–32)
Calcium: 9.7 mg/dL (ref 8.4–10.5)
Creatinine, Ser: 2.44 mg/dL — ABNORMAL HIGH (ref 0.40–1.50)
GFR: 33.61 mL/min — ABNORMAL LOW (ref >60.00)
Glucose, Bld: 105 mg/dL — ABNORMAL HIGH (ref 70–99)
POTASSIUM: 5.1 meq/L (ref 3.5–5.1)
Sodium: 139 mEq/L (ref 135–145)

## 2015-09-18 LAB — T4, FREE: Free T4: 0.68 ng/dL (ref 0.60–1.60)

## 2015-09-18 NOTE — Assessment & Plan Note (Signed)
Chronic problem.  Not currently on medication.  Asymptomatic.  Check labs.  Determine next steps based on results.

## 2015-09-18 NOTE — Patient Instructions (Signed)
Follow up in 3-4 months to recheck diabetes, BP, and cholesterol We'll notify you of your lab results and make any changes if needed Keep up the good work!  You look great! Call and schedule your eye exam! Let me know if you need help scheduling a foot exam Call with any questions or concerns [place]! [employer]!!!

## 2015-09-18 NOTE — Assessment & Plan Note (Signed)
Chronic problem.  Not currently on medications.  Overdue on eye exam- pt plans to schedule (declined referral).  UTD on foot exam.  On ACE for renal protection.  Asymptomatic.  Check labs.  Start meds prn.

## 2015-09-18 NOTE — Progress Notes (Signed)
Pre visit review using our clinic review tool, if applicable. No additional management support is needed unless otherwise documented below in the visit note. 

## 2015-09-18 NOTE — Progress Notes (Signed)
   Subjective:    Patient ID: Tyler Hahn, male    DOB: May 06, 1941, 75 y.o.   MRN: [id number]  HPI DM- chronic problem, pt has not seen Dr Loanne Drilling since Oct.  UTD on foot exam.  Due for eye exam.  On ACE for renal protection.  Currently not on medication.  Denies symptomatic lows.  Denies CP, SOB, HAs, visual changes, edema.  No numbness/tingling of hands/feet.  Hyperthyroid- not currently on thyroid medication.  Pt reports Dr Loanne Drilling told him to stop meds.  Denies palpitations, fatigue.  No changes to bowel or bladder habits.  Review of Systems For ROS see HPI     Objective:   Physical Exam  Constitutional: He is oriented to person, place, and time. He appears well-developed and well-nourished. No distress.  HENT:  Head: Normocephalic and atraumatic.  Eyes: Conjunctivae and EOM are normal. Pupils are equal, round, and reactive to light.  Neck: Normal range of motion. Neck supple. No thyromegaly present.  Cardiovascular: Normal rate, regular rhythm, normal heart sounds and intact distal pulses.   No murmur heard. Pulmonary/Chest: Effort normal and breath sounds normal. No respiratory distress.  Abdominal: Soft. Bowel sounds are normal. He exhibits no distension.  Musculoskeletal: He exhibits no edema.  Lymphadenopathy:    He has no cervical adenopathy.  Neurological: He is alert and oriented to person, place, and time. No cranial nerve deficit.  Skin: Skin is warm and dry.  Psychiatric: He has a normal mood and affect. His behavior is normal.  Vitals reviewed.         Assessment & Plan:

## 2015-09-19 ENCOUNTER — Encounter: Payer: Self-pay | Admitting: General Practice

## 2015-09-25 ENCOUNTER — Encounter (HOSPITAL_COMMUNITY): Payer: Self-pay | Admitting: *Deleted

## 2015-10-03 ENCOUNTER — Encounter (HOSPITAL_COMMUNITY)
Admission: RE | Disposition: A | Discharge status: Home / Self Care | Payer: Self-pay | Source: Ambulatory Visit | Attending: Gastroenterology

## 2015-10-03 ENCOUNTER — Ambulatory Visit (HOSPITAL_COMMUNITY): Payer: Medicare HMO | Admitting: Anesthesiology

## 2015-10-03 ENCOUNTER — Encounter (HOSPITAL_COMMUNITY): Payer: Self-pay | Admitting: *Deleted

## 2015-10-03 ENCOUNTER — Ambulatory Visit (HOSPITAL_COMMUNITY)
Admission: RE | Admit: 2015-10-03 | Discharge: 2015-10-03 | Disposition: A | Discharge status: Home / Self Care | Payer: Medicare HMO | Source: Ambulatory Visit | Attending: Gastroenterology | Admitting: Gastroenterology

## 2015-10-03 DIAGNOSIS — K5730 Diverticulosis of large intestine without perforation or abscess without bleeding: Secondary | ICD-10-CM | POA: Insufficient documentation

## 2015-10-03 DIAGNOSIS — K640 First degree hemorrhoids: Secondary | ICD-10-CM | POA: Insufficient documentation

## 2015-10-03 DIAGNOSIS — Z87891 Personal history of nicotine dependence: Secondary | ICD-10-CM | POA: Insufficient documentation

## 2015-10-03 DIAGNOSIS — I10 Essential (primary) hypertension: Secondary | ICD-10-CM | POA: Insufficient documentation

## 2015-10-03 DIAGNOSIS — K5790 Diverticulosis of intestine, part unspecified, without perforation or abscess without bleeding: Secondary | ICD-10-CM | POA: Diagnosis not present

## 2015-10-03 DIAGNOSIS — Z1211 Encounter for screening for malignant neoplasm of colon: Secondary | ICD-10-CM | POA: Diagnosis not present

## 2015-10-03 DIAGNOSIS — I420 Dilated cardiomyopathy: Secondary | ICD-10-CM

## 2015-10-03 DIAGNOSIS — K648 Other hemorrhoids: Secondary | ICD-10-CM | POA: Diagnosis not present

## 2015-10-03 DIAGNOSIS — I429 Cardiomyopathy, unspecified: Secondary | ICD-10-CM | POA: Insufficient documentation

## 2015-10-03 DIAGNOSIS — D123 Benign neoplasm of transverse colon: Secondary | ICD-10-CM | POA: Insufficient documentation

## 2015-10-03 DIAGNOSIS — E119 Type 2 diabetes mellitus without complications: Secondary | ICD-10-CM | POA: Diagnosis not present

## 2015-10-03 HISTORY — PX: COLONOSCOPY WITH PROPOFOL: SHX5780

## 2015-10-03 SURGERY — COLONOSCOPY WITH PROPOFOL
Anesthesia: Monitor Anesthesia Care

## 2015-10-03 MED ORDER — LACTATED RINGERS IV SOLN: INTRAVENOUS | Status: DC

## 2015-10-03 MED ORDER — PROPOFOL 10 MG/ML IV BOLUS
INTRAVENOUS | Status: DC | PRN
  Administered 2015-10-03: 20 mg via INTRAVENOUS
  Administered 2015-10-03: 10 mg via INTRAVENOUS
  Administered 2015-10-03: 20 mg via INTRAVENOUS
  Administered 2015-10-03 (×4): 10 mg via INTRAVENOUS

## 2015-10-03 MED ORDER — SODIUM CHLORIDE 0.9 % IV SOLN
INTRAVENOUS | Status: DC
  Administered 2015-10-03: 1000 mL via INTRAVENOUS

## 2015-10-03 MED ORDER — LACTATED RINGERS IV SOLN
INTRAVENOUS | Status: DC | PRN
  Administered 2015-10-03: 09:00:00 via INTRAVENOUS

## 2015-10-03 MED ORDER — LIDOCAINE HCL (CARDIAC) 20 MG/ML IV SOLN
INTRAVENOUS | Status: DC | PRN
  Administered 2015-10-03: 75 mg via INTRAVENOUS

## 2015-10-03 MED ORDER — PROPOFOL 10 MG/ML IV BOLUS
INTRAVENOUS | Status: AC
  Filled 2015-10-03: qty 40

## 2015-10-03 SURGICAL SUPPLY — 21 items

## 2015-10-03 NOTE — H&P (View-Only) (Signed)
    History of Present Illness: This is a 75 year old male with cardiomyopathy and ICD presenting for colon cancer screening. He previously underwent colonoscopy in 2006 at [facility] showing small non-precancerous colon polyps. The exam is otherwise unremarkable. His last echocardiogram was performed in April 2014 showing an ejection fraction of 20-25%. He had a follow-up appointment with Dr. Caryl Comes of cardiology in November 2016. Denies weight loss, abdominal pain, constipation, diarrhea, change in stool caliber, melena, hematochezia, nausea, vomiting, dysphagia, reflux symptoms, chest pain.  Current Medications, Allergies, Past Medical History, Past Surgical History, Family History and Social History were reviewed in [employer] record.  Physical Exam: General: Well developed, well nourished, no acute distress Head: Normocephalic and atraumatic Eyes:  sclerae anicteric, EOMI Ears: Normal auditory acuity Mouth: No deformity or lesions Lungs: Clear throughout to auscultation Heart: Regular rate and rhythm; no murmurs, rubs or bruits Abdomen: Soft, non tender and non distended. No masses, hepatosplenomegaly or hernias noted. Normal Bowel sounds Rectal: Deferred to colonoscopy Musculoskeletal: Symmetrical with no gross deformities  Pulses:  Normal pulses noted Extremities: No clubbing, cyanosis, edema or deformities noted Neurological: Alert oriented x 4, grossly nonfocal Psychological:  Alert and cooperative. Normal mood and affect  Assessment and Recommendations:  1. Colorectal cancer screening, average risk. He has a slightly increased risk of cardiopulmonary complications with colonoscopy and sedation due to his cardiomyopathy with ejection fraction 20-25%. The risks (including bleeding, perforation, infection, missed lesions, medication reactions and possible hospitalization or surgery if complications occur), benefits, and alternatives to colonoscopy  with possible biopsy and possible polypectomy were discussed with the patient and they consent to proceed.

## 2015-10-03 NOTE — Op Note (Signed)
[facility] Patient Name: Tyler Hahn Procedure Date: 10/03/2015 MRN: [id number] Attending MD: Ladene Artist , MD Date of Birth: 05/13/1941 CSN:  Age: 75 Admit Type: Outpatient Procedure:                Colonoscopy Indications:              Screening for colorectal malignant neoplasm Providers:                Pricilla Riffle. Fuller Plan, MD, Dustin Flock, RN, Cletis Athens, Technician Referring MD:             Aundra Millet. Birdie Riddle, MD Medicines:                Monitored Anesthesia Care Complications:            No immediate complications. Estimated Blood Loss:     Estimated blood loss was minimal. Procedure:                Pre-Anesthesia Assessment:                           - Prior to the procedure, a History and Physical                            was performed, and patient medications and                            allergies were reviewed. The patient's tolerance of                            previous anesthesia was also reviewed. The risks                            and benefits of the procedure and the sedation                            options and risks were discussed with the patient.                            All questions were answered, and informed consent                            was obtained. Prior Anticoagulants: The patient has                            taken no previous anticoagulant or antiplatelet                            agents. ASA Grade Assessment: III - A patient with                            severe systemic disease. After reviewing the risks  and benefits, the patient was deemed in                            satisfactory condition to undergo the procedure.                           After obtaining informed consent, the colonoscope                            was passed under direct vision. Throughout the                            procedure, the patient's blood pressure, pulse, and                  oxygen saturations were monitored continuously. The                            Colonoscope was introduced through the anus and                            advanced to the the cecum, identified by                            appendiceal orifice and ileocecal valve. The                            colonoscopy was performed without difficulty. The                            patient tolerated the procedure well. The quality                            of the bowel preparation was good. The ileocecal                            valve, appendiceal orifice, and rectum were                            photographed. Scope In: 9:46:45 AM Scope Out: 10:01:06 AM Scope Withdrawal Time: 0 hours 11 minutes 7 seconds  Total Procedure Duration: 0 hours 14 minutes 21 seconds  Findings:      The digital rectal exam was normal.      Three sessile polyps were found in the transverse colon. The polyps were       7 to 8 mm in size. These polyps were removed with a cold snare.       Resection and retrieval were complete.      A few small-mouthed diverticula were found in the sigmoid colon. There       was no evidence of diverticular bleeding.      Internal hemorrhoids were found during retroflexion. The hemorrhoids       were small and Grade I (internal hemorrhoids that do not prolapse).      The exam was otherwise normal throughout the examined colon.      No additional abnormalities were found on retroflexion. Impression:               -  Three 7 to 8 mm polyps in the transverse colon,                            removed with a cold snare. Resected and retrieved.                           - Mild diverticulosis in the sigmoid colon.                           - Internal hemorrhoids. Moderate Sedation:      N/A- Per Anesthesia Care Recommendation:           - Patient has a contact number available for                            emergencies. The signs and symptoms of potential                             delayed complications were discussed with the                            patient. Return to normal activities tomorrow.                            Written discharge instructions were provided to the                            patient.                           - Resume previous diet.                           - Continue present medications.                           - Await pathology results.                           - Repeat colonoscopy in 5 years for surveillance if                            polyp(s) precancerous and health status appropriate                            for surveillance. Procedure Code(s):        --- Professional ---                           [phone], Colonoscopy, flexible; with removal of                            tumor(s), polyp(s), or other lesion(s) by snare                            technique Diagnosis Code(s):        ---  Professional ---                           Z12.11, Encounter for screening for malignant                            neoplasm of colon                           D12.3, Benign neoplasm of transverse colon (hepatic                            flexure or splenic flexure)                           K64.0, First degree hemorrhoids                           K57.30, Diverticulosis of large intestine without                            perforation or abscess without bleeding CPT copyright 2016 American Medical Association. All rights reserved. The codes documented in this report are preliminary and upon coder review may  be revised to meet current compliance requirements. Ladene Artist, MD 10/03/2015 10:05:38 AM This report has been signed electronically. Number of Addenda: 0

## 2015-10-03 NOTE — Discharge Instructions (Signed)

## 2015-10-03 NOTE — Interval H&P Note (Signed)
History and Physical Interval Note:  10/03/2015 9:25 AM  Tyler Hahn  has presented today for surgery, with the diagnosis of Screening for Colonoscopy, Low EF%  The various methods of treatment have been discussed with the patient and family. After consideration of risks, benefits and other options for treatment, the patient has consented to  Procedure(s): COLONOSCOPY WITH PROPOFOL (N/A) as a surgical intervention .  The patient's history has been reviewed, patient examined, no change in status, stable for surgery.  I have reviewed the patient's chart and labs.  Questions were answered to the patient's satisfaction.     Pricilla Riffle. Fuller Plan

## 2015-10-03 NOTE — Transfer of Care (Signed)
Immediate Anesthesia Transfer of Care Note  Patient: Tyler Hahn  Procedure(s) Performed: Procedure(s): COLONOSCOPY WITH PROPOFOL (N/A)  Patient Location: PACU and Endoscopy Unit  Anesthesia Type:MAC  Level of Consciousness: awake, oriented, patient cooperative, lethargic and responds to stimulation  Airway & Oxygen Therapy: Patient Spontanous Breathing and Patient connected to face mask oxygen  Post-op Assessment: Report given to RN, Post -op Vital signs reviewed and stable and Patient moving all extremities  Post vital signs: Reviewed and stable  Last Vitals:  Filed Vitals:   10/03/15 0829  BP: 171/76  Pulse: 71  Temp: 36.6 C  Resp: 12    Complications: No apparent anesthesia complications

## 2015-10-03 NOTE — Anesthesia Postprocedure Evaluation (Signed)
Anesthesia Post Note  Patient: Tyler Hahn  Procedure(s) Performed: Procedure(s) (LRB): COLONOSCOPY WITH PROPOFOL (N/A)  Patient location during evaluation: PACU Anesthesia Type: MAC Level of consciousness: awake and alert Pain management: pain level controlled Vital Signs Assessment: post-procedure vital signs reviewed and stable Respiratory status: spontaneous breathing, nonlabored ventilation, respiratory function stable and patient connected to nasal cannula oxygen Cardiovascular status: stable and blood pressure returned to baseline Anesthetic complications: no    Last Vitals:  Filed Vitals:   10/03/15 1020 10/03/15 1025  BP: 158/74 135/106  Pulse: 62 61  Temp:    Resp: 20 21    Last Pain: There were no vitals filed for this visit.               Luretha Eberly J

## 2015-10-03 NOTE — Anesthesia Preprocedure Evaluation (Addendum)
Anesthesia Evaluation  Patient identified by MRN, date of birth, ID band Patient awake    Reviewed: Allergy & Precautions, NPO status , Patient's Chart, lab work & pertinent test results  History of Anesthesia Complications (+) PONV and history of anesthetic complications  Airway Mallampati: II  TM Distance: >3 FB Neck ROM: Full    Dental no notable dental hx.    Pulmonary former smoker,    Pulmonary exam normal breath sounds clear to auscultation       Cardiovascular hypertension, Pt. on medications and Pt. on home beta blockers +CHF  Normal cardiovascular exam+ Cardiac Defibrillator  Rhythm:Regular Rate:Normal  ECHO 09-17-12: Study Conclusions  - Left ventricle: The cavity size was mildly dilated. Wall thickness was normal. Systolic function was severely reduced. The estimated ejection fraction was in the range of 20% to 25%. Diffuse hypokinesis. Doppler parameters are consistent with abnormal left ventricular relaxation (grade 1 diastolic dysfunction). - Aortic valve: There was no stenosis. - Mitral valve: Mildly calcified annulus. Trivial regurgitation. - Left atrium: The atrium was mildly dilated. - Right ventricle: The cavity size was normal. Pacer wire or catheter noted in right ventricle. Systolic function was mildly reduced. - Pulmonary arteries: No complete TR doppler jet so unable to estimate PA systolic pressure. - Inferior vena cava: The vessel was normal in size; the respirophasic diameter changes were in the normal range (= 50%); findings are consistent with normal central venous pressure. Impressions:  - Mildly dilated left ventricle with severe global hypokinesis, EF 20-25%. Normal RV size with mildly decreased systolic function. No significant valvular abnormalities.    Neuro/Psych PSYCHIATRIC DISORDERS Anxiety  Neuromuscular disease    GI/Hepatic negative GI ROS,  Neg liver ROS,   Endo/Other  diabetesHyperthyroidism   Renal/GU Renal InsufficiencyRenal diseaseCr 2.44 K 5.1   negative genitourinary   Musculoskeletal negative musculoskeletal ROS (+)   Abdominal   Peds negative pediatric ROS (+)  Hematology negative hematology ROS (+)   Anesthesia Other Findings   Reproductive/Obstetrics negative OB ROS                            Anesthesia Physical Anesthesia Plan  ASA: III  Anesthesia Plan: MAC   Post-op Pain Management:    Induction: Intravenous  Airway Management Planned: Natural Airway  Additional Equipment:   Intra-op Plan:   Post-operative Plan:   Informed Consent: I have reviewed the patients History and Physical, chart, labs and discussed the procedure including the risks, benefits and alternatives for the proposed anesthesia with the patient or authorized representative who has indicated his/her understanding and acceptance.   Dental advisory given  Plan Discussed with: CRNA  Anesthesia Plan Comments:         Anesthesia Quick Evaluation

## 2015-10-04 ENCOUNTER — Encounter (HOSPITAL_COMMUNITY): Payer: Self-pay | Admitting: Gastroenterology

## 2015-10-04 ENCOUNTER — Encounter: Payer: Self-pay | Admitting: Gastroenterology

## 2015-10-06 ENCOUNTER — Other Ambulatory Visit: Payer: Self-pay | Admitting: Family Medicine

## 2015-10-06 NOTE — Telephone Encounter (Signed)
Medication filled to pharmacy as requested.   

## 2015-10-24 ENCOUNTER — Other Ambulatory Visit: Payer: Self-pay | Admitting: Family Medicine

## 2015-10-24 NOTE — Telephone Encounter (Signed)
Medication filled to pharmacy as requested.   

## 2015-11-06 ENCOUNTER — Ambulatory Visit (INDEPENDENT_AMBULATORY_CARE_PROVIDER_SITE_OTHER): Payer: Medicare HMO | Admitting: *Deleted

## 2015-11-06 DIAGNOSIS — I429 Cardiomyopathy, unspecified: Secondary | ICD-10-CM | POA: Diagnosis not present

## 2015-11-06 DIAGNOSIS — I428 Other cardiomyopathies: Secondary | ICD-10-CM

## 2015-11-06 DIAGNOSIS — Z95810 Presence of automatic (implantable) cardiac defibrillator: Secondary | ICD-10-CM | POA: Diagnosis not present

## 2015-11-07 NOTE — Progress Notes (Signed)
Remote ICD transmission.   

## 2015-11-26 LAB — CUP PACEART REMOTE DEVICE CHECK
Battery Remaining Longevity: 60 mo
Battery Remaining Percentage: 70 %
Brady Statistic RA Percent Paced: 4 %
HighPow Impedance: 46 Ohm
Implantable Lead Implant Date: 20091218
Implantable Lead Implant Date: 20091218
Implantable Lead Location: 753860
Implantable Lead Model: 158
Lead Channel Impedance Value: 375 Ohm
Lead Channel Pacing Threshold Pulse Width: 0.5 ms
Lead Channel Setting Pacing Amplitude: 2.4 V
Lead Channel Setting Pacing Pulse Width: 0.5 ms
MDC IDC LEAD LOCATION: 753859
MDC IDC LEAD SERIAL: 131093
MDC IDC MSMT LEADCHNL RA PACING THRESHOLD AMPLITUDE: 0.7 V
MDC IDC MSMT LEADCHNL RV IMPEDANCE VALUE: 536 Ohm
MDC IDC MSMT LEADCHNL RV PACING THRESHOLD AMPLITUDE: 0.5 V
MDC IDC MSMT LEADCHNL RV PACING THRESHOLD PULSEWIDTH: 0.5 ms
MDC IDC PG SERIAL: 131093
MDC IDC SESS DTM: 20170522043100
MDC IDC SET LEADCHNL RA PACING AMPLITUDE: 2 V
MDC IDC SET LEADCHNL RV SENSING SENSITIVITY: 0.6 mV
MDC IDC STAT BRADY RV PERCENT PACED: 0 %

## 2015-11-30 ENCOUNTER — Encounter: Payer: Self-pay | Admitting: Cardiology

## 2015-12-21 ENCOUNTER — Telehealth: Payer: Self-pay

## 2015-12-21 NOTE — Telephone Encounter (Signed)
Patient is on the list for [employer] 2017 and may be a good candidate for an AWV in 2017. Please let me know if/when appt is scheduled.   

## 2015-12-21 NOTE — Telephone Encounter (Signed)
LM for pt to return call for scheduling. °

## 2015-12-21 NOTE — Telephone Encounter (Signed)
Please set up AWV and let Stefannie know once pt is scheduled. Thank you.

## 2016-01-04 DIAGNOSIS — N184 Chronic kidney disease, stage 4 (severe): Secondary | ICD-10-CM | POA: Diagnosis not present

## 2016-01-11 DIAGNOSIS — N189 Chronic kidney disease, unspecified: Secondary | ICD-10-CM | POA: Diagnosis not present

## 2016-01-11 DIAGNOSIS — I10 Essential (primary) hypertension: Secondary | ICD-10-CM | POA: Diagnosis not present

## 2016-01-11 DIAGNOSIS — D631 Anemia in chronic kidney disease: Secondary | ICD-10-CM | POA: Diagnosis not present

## 2016-01-11 DIAGNOSIS — N2581 Secondary hyperparathyroidism of renal origin: Secondary | ICD-10-CM | POA: Diagnosis not present

## 2016-01-11 DIAGNOSIS — N184 Chronic kidney disease, stage 4 (severe): Secondary | ICD-10-CM | POA: Diagnosis not present

## 2016-01-12 ENCOUNTER — Other Ambulatory Visit: Payer: Self-pay | Admitting: Family Medicine

## 2016-01-30 ENCOUNTER — Other Ambulatory Visit: Payer: Self-pay | Admitting: Family Medicine

## 2016-02-06 ENCOUNTER — Ambulatory Visit (INDEPENDENT_AMBULATORY_CARE_PROVIDER_SITE_OTHER): Payer: Medicare HMO | Admitting: *Deleted

## 2016-02-06 DIAGNOSIS — I429 Cardiomyopathy, unspecified: Secondary | ICD-10-CM

## 2016-02-06 DIAGNOSIS — Z95810 Presence of automatic (implantable) cardiac defibrillator: Secondary | ICD-10-CM

## 2016-02-06 DIAGNOSIS — I428 Other cardiomyopathies: Secondary | ICD-10-CM

## 2016-02-06 NOTE — Progress Notes (Signed)
Remote ICD transmission.   

## 2016-02-09 DIAGNOSIS — N184 Chronic kidney disease, stage 4 (severe): Secondary | ICD-10-CM | POA: Diagnosis not present

## 2016-02-14 ENCOUNTER — Encounter: Payer: Self-pay | Admitting: Cardiology

## 2016-02-16 LAB — CUP PACEART REMOTE DEVICE CHECK
Battery Remaining Percentage: 66 %
Brady Statistic RA Percent Paced: 5 %
HighPow Impedance: 52 Ohm
Implantable Lead Implant Date: 20091218
Implantable Lead Location: 753859
Implantable Lead Location: 753860
Lead Channel Impedance Value: 628 Ohm
Lead Channel Pacing Threshold Pulse Width: 0.5 ms
Lead Channel Setting Pacing Amplitude: 2.4 V
MDC IDC LEAD IMPLANT DT: 20091218
MDC IDC LEAD MODEL: 158
MDC IDC LEAD SERIAL: 131093
MDC IDC MSMT BATTERY REMAINING LONGEVITY: 54 mo
MDC IDC MSMT LEADCHNL RA IMPEDANCE VALUE: 414 Ohm
MDC IDC MSMT LEADCHNL RA PACING THRESHOLD AMPLITUDE: 0.7 V
MDC IDC MSMT LEADCHNL RV PACING THRESHOLD AMPLITUDE: 0.5 V
MDC IDC MSMT LEADCHNL RV PACING THRESHOLD PULSEWIDTH: 0.5 ms
MDC IDC SESS DTM: 20170822043200
MDC IDC SET LEADCHNL RA PACING AMPLITUDE: 2 V
MDC IDC SET LEADCHNL RV PACING PULSEWIDTH: 0.5 ms
MDC IDC SET LEADCHNL RV SENSING SENSITIVITY: 0.6 mV
MDC IDC STAT BRADY RV PERCENT PACED: 0 %
Pulse Gen Serial Number: 131093

## 2016-04-16 DIAGNOSIS — N2581 Secondary hyperparathyroidism of renal origin: Secondary | ICD-10-CM | POA: Diagnosis not present

## 2016-04-16 DIAGNOSIS — N184 Chronic kidney disease, stage 4 (severe): Secondary | ICD-10-CM | POA: Diagnosis not present

## 2016-04-16 DIAGNOSIS — N189 Chronic kidney disease, unspecified: Secondary | ICD-10-CM | POA: Diagnosis not present

## 2016-04-18 DIAGNOSIS — Z0000 Encounter for general adult medical examination without abnormal findings: Secondary | ICD-10-CM | POA: Diagnosis not present

## 2016-04-18 DIAGNOSIS — E789 Disorder of lipoprotein metabolism, unspecified: Secondary | ICD-10-CM | POA: Diagnosis not present

## 2016-04-18 DIAGNOSIS — I509 Heart failure, unspecified: Secondary | ICD-10-CM | POA: Diagnosis not present

## 2016-04-18 DIAGNOSIS — Z6826 Body mass index (BMI) 26.0-26.9, adult: Secondary | ICD-10-CM | POA: Diagnosis not present

## 2016-04-18 DIAGNOSIS — I429 Cardiomyopathy, unspecified: Secondary | ICD-10-CM | POA: Diagnosis not present

## 2016-04-18 DIAGNOSIS — R69 Illness, unspecified: Secondary | ICD-10-CM | POA: Diagnosis not present

## 2016-04-18 DIAGNOSIS — E0590 Thyrotoxicosis, unspecified without thyrotoxic crisis or storm: Secondary | ICD-10-CM | POA: Diagnosis not present

## 2016-04-18 DIAGNOSIS — I130 Hypertensive heart and chronic kidney disease with heart failure and stage 1 through stage 4 chronic kidney disease, or unspecified chronic kidney disease: Secondary | ICD-10-CM | POA: Diagnosis not present

## 2016-04-18 DIAGNOSIS — N184 Chronic kidney disease, stage 4 (severe): Secondary | ICD-10-CM | POA: Diagnosis not present

## 2016-04-18 DIAGNOSIS — N2581 Secondary hyperparathyroidism of renal origin: Secondary | ICD-10-CM | POA: Diagnosis not present

## 2016-04-22 DIAGNOSIS — I10 Essential (primary) hypertension: Secondary | ICD-10-CM | POA: Diagnosis not present

## 2016-04-22 DIAGNOSIS — E875 Hyperkalemia: Secondary | ICD-10-CM | POA: Diagnosis not present

## 2016-04-22 DIAGNOSIS — D631 Anemia in chronic kidney disease: Secondary | ICD-10-CM | POA: Diagnosis not present

## 2016-04-22 DIAGNOSIS — N184 Chronic kidney disease, stage 4 (severe): Secondary | ICD-10-CM | POA: Diagnosis not present

## 2016-04-22 DIAGNOSIS — N2581 Secondary hyperparathyroidism of renal origin: Secondary | ICD-10-CM | POA: Diagnosis not present

## 2016-04-22 DIAGNOSIS — E872 Acidosis: Secondary | ICD-10-CM | POA: Diagnosis not present

## 2016-04-23 ENCOUNTER — Other Ambulatory Visit: Payer: Self-pay | Admitting: Family Medicine

## 2016-07-08 ENCOUNTER — Other Ambulatory Visit: Payer: Self-pay | Admitting: Family Medicine

## 2016-08-12 DIAGNOSIS — N184 Chronic kidney disease, stage 4 (severe): Secondary | ICD-10-CM | POA: Diagnosis not present

## 2016-08-22 DIAGNOSIS — D631 Anemia in chronic kidney disease: Secondary | ICD-10-CM | POA: Diagnosis not present

## 2016-08-22 DIAGNOSIS — N2581 Secondary hyperparathyroidism of renal origin: Secondary | ICD-10-CM | POA: Diagnosis not present

## 2016-08-22 DIAGNOSIS — I10 Essential (primary) hypertension: Secondary | ICD-10-CM | POA: Diagnosis not present

## 2016-08-22 DIAGNOSIS — E872 Acidosis: Secondary | ICD-10-CM | POA: Diagnosis not present

## 2016-08-22 DIAGNOSIS — E875 Hyperkalemia: Secondary | ICD-10-CM | POA: Diagnosis not present

## 2016-08-22 DIAGNOSIS — N184 Chronic kidney disease, stage 4 (severe): Secondary | ICD-10-CM | POA: Diagnosis not present

## 2016-08-25 LAB — HM DIABETES EYE EXAM

## 2016-09-06 ENCOUNTER — Encounter: Payer: Self-pay | Admitting: Cardiology

## 2016-09-17 ENCOUNTER — Encounter: Payer: Self-pay | Admitting: Internal Medicine

## 2016-09-17 ENCOUNTER — Ambulatory Visit (INDEPENDENT_AMBULATORY_CARE_PROVIDER_SITE_OTHER): Payer: Medicare HMO | Admitting: Internal Medicine

## 2016-09-17 VITALS — BP 140/68 | HR 61 | Ht 67.0 in | Wt 179.0 lb

## 2016-09-17 DIAGNOSIS — I5022 Chronic systolic (congestive) heart failure: Secondary | ICD-10-CM

## 2016-09-17 DIAGNOSIS — I428 Other cardiomyopathies: Secondary | ICD-10-CM | POA: Diagnosis not present

## 2016-09-17 DIAGNOSIS — Z95810 Presence of automatic (implantable) cardiac defibrillator: Secondary | ICD-10-CM

## 2016-09-17 NOTE — Patient Instructions (Signed)
Medication Instructions: - Your physician recommends that you continue on your current medications as directed. Please refer to the Current Medication list given to you today.  Labwork: - none ordered  Procedures/Testing: - none ordered  Follow-Up: - Remote monitoring is used to monitor your Pacemaker of ICD from home. This monitoring reduces the number of office visits required to check your device to one time per year. It allows [place] to keep an eye on the functioning of your device to ensure it is working properly. You are scheduled for a device check from home on 12/17/16. You may send your transmission at any time that day. If you have a wireless device, the transmission will be sent automatically. After your physician reviews your transmission, you will receive a postcard with your next transmission date.  - Your physician wants you to follow-up in: 1 year with [name], PA for Dr. Caryl Comes. You will receive a reminder letter in the mail two months in advance. If you don't receive a letter, please call our office to schedule the follow-up appointment.  Any Additional Special Instructions Will Be Listed Below (If Applicable).     If you need a refill on your cardiac medications before your next appointment, please call your pharmacy.

## 2016-09-17 NOTE — Progress Notes (Signed)
Patient Care Team: Katherine E Tabori, MD as PCP - General Ryan B Sanford, MD as Attending Physician (Nephrology)  C , MD as Consulting Physician (Cardiology) Sean Ellison, MD as Consulting Physician (Endocrinology) Malcolm T Stark, MD as Consulting Physician (Gastroenterology)   HPI  Tyler Hahn is a 76 y.o. 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    He has renal insufficiency;  follwoed by renal   The patient denies chest pain, shortness of breath, nocturnal dyspnea, orthopnea or peripheral edema.  There have been no palpitations, lightheadedness or syncope.    Past Medical History:  Diagnosis Date  . Anxiety   . Automatic implantable cardioverter-defibrillator in situ    greg taylor  . CHF (congestive heart failure) (HCC)    2000  . Diabetes mellitus    no meds  . Fatty liver   . Gout    "bout 2-3 months ago"-meds helped.  . Hyperlipidemia   . Hypertension   . Hyperthyroidism   . Kidney cysts   . PONV (postoperative nausea and vomiting)   . Renal insufficiency     Past Surgical History:  Procedure Laterality Date  . CARDIAC DEFIBRILLATOR PLACEMENT    . COLONOSCOPY WITH PROPOFOL N/A 10/03/2015   Procedure: COLONOSCOPY WITH PROPOFOL;  Surgeon: Malcolm T Stark, MD;  Location: [facility] ENDOSCOPY;  Service: Endoscopy;  Laterality: N/A;  . DIAGNOSTIC LAPAROSCOPY  01/27/2014   Dr Cornett  . ENTEROSCOPY N/A 11/25/2013   Procedure: ENTEROSCOPY;  Surgeon: Malcolm T Stark, MD;  Location: [facility] ENDOSCOPY;  Service: Endoscopy;  Laterality: N/A;  . ICD,[employer]    . LAPAROSCOPY N/A 01/27/2014   Procedure: LAPAROSCOPY DIAGNOSTIC;  Surgeon: Thomas A. Cornett, MD;  Location: [facility] OR;  Service: General;  Laterality: N/A;  . polyp removal throat     difficulty speaking    Current Outpatient Prescriptions  Medication Sig Dispense Refill  . allopurinol (ZYLOPRIM) 100 MG tablet Take 1 tablet (100 mg total) by  mouth daily. 30 tablet 3  . atorvastatin (LIPITOR) 20 MG tablet TAKE ONE TABLET BY MOUTH ONCE DAILY 90 tablet 0  . calcitRIOL (ROCALTROL) 0.5 MCG capsule Take 0.5 mcg by mouth daily.    . carvedilol (COREG) 12.5 MG tablet TAKE ONE TABLET BY MOUTH TWICE DAILY 180 tablet 0  . fenofibrate 160 MG tablet Take 1 tablet (160 mg total) by mouth daily. 30 tablet 6  . lisinopril (PRINIVIL,ZESTRIL) 20 MG tablet TAKE ONE TABLET BY MOUTH ONCE DAILY 90 tablet 0  . Na Sulfate-K Sulfate-Mg Sulf SOLN Take 1 kit by mouth once. 354 mL 0  . Omega 3 1200 MG CAPS Take 1,200 mg by mouth daily.    . omeprazole (PRILOSEC) 40 MG capsule TAKE ONE CAPSULE BY MOUTH ONCE DAILY 30 capsule 6  . oxyCODONE-acetaminophen (PERCOCET/ROXICET) 5-325 MG per tablet Take 1-2 tablets by mouth every 6 (six) hours as needed for severe pain. 40 tablet 0  . sertraline (ZOLOFT) 25 MG tablet TAKE ONE TABLET BY MOUTH ONCE DAILY 30 tablet 6  . torsemide (DEMADEX) 20 MG tablet TAKE ONE TABLET BY MOUTH ONCE DAILY 30 tablet 3   No current facility-administered medications for this visit.     Allergies  Allergen Reactions  . Sulfonamide Derivatives Itching and Rash    Review of Systems negative except from HPI and PMH  Physical Exam BP 140/68   Pulse 61   Ht 5' 7" (1.702 m)   Wt 179 lb (81.2 kg)     SpO2 95%   BMI 28.04 kg/m  Well developed and well nourished in no acute distress HENT normal E scleral and icterus clear Neck Supple JVP flat; carotids brisk and full Clear to ausculation regular rate and rhythm Early systolic   murmurs no rub Device pocket well healed; without hematoma or erythema.  There is no tethering  Soft with active bowel sounds No clubbing cyanosis none Edema  Swelling of his left wrist Alert and oriented, grossly normal motor and sensory function Skin Warm and Dry  ECG personally reviewed atrial paced at 6016/11/48 T-wave inversions V3-V6 unchanged   Assessment and  Plan  Nonischemic cardiomyopathy   Continue  Congestive heart failure-chronic-systolic     Hypertension reasonably controlled   vt-NS no intercurrent VT   Implantable defibrillator-[employer] dual-chamber The patient's device was interrogated.  The information was reviewed. No changes were made in the programming.      Renal insufficiency grade 4  Followed by renal   Guideline directed medical therapy limited by renal disease  Defer medical therapy issues to Dr. Joelyn Oms. Euvolemic continue current meds He is euvolemic.

## 2016-09-19 LAB — CUP PACEART INCLINIC DEVICE CHECK
Battery Remaining Longevity: 48 mo
Date Time Interrogation Session: 20180405120842
HighPow Impedance: 49 Ohm
Implantable Lead Implant Date: 20091218
Implantable Lead Location: 753860
Implantable Lead Model: 158
Implantable Pulse Generator Implant Date: 20091218
Lead Channel Pacing Threshold Amplitude: 0.4 V
Lead Channel Sensing Intrinsic Amplitude: 594 mV
Lead Channel Setting Pacing Amplitude: 2.4 V
Lead Channel Setting Pacing Pulse Width: 0.5 ms
MDC IDC LEAD IMPLANT DT: 20091218
MDC IDC LEAD LOCATION: 753859
MDC IDC LEAD SERIAL: 131093
MDC IDC MSMT LEADCHNL RA IMPEDANCE VALUE: 391 Ohm
MDC IDC MSMT LEADCHNL RA PACING THRESHOLD AMPLITUDE: 0.6 V
MDC IDC MSMT LEADCHNL RA PACING THRESHOLD PULSEWIDTH: 0.5 ms
MDC IDC MSMT LEADCHNL RA SENSING INTR AMPL: 3.2 mV
MDC IDC MSMT LEADCHNL RV IMPEDANCE VALUE: 594 Ohm
MDC IDC MSMT LEADCHNL RV PACING THRESHOLD PULSEWIDTH: 0.5 ms
MDC IDC PG SERIAL: 131093
MDC IDC SET LEADCHNL RA PACING AMPLITUDE: 2 V
MDC IDC SET LEADCHNL RV SENSING SENSITIVITY: 0.6 mV
MDC IDC STAT BRADY RA PERCENT PACED: 2 %
MDC IDC STAT BRADY RV PERCENT PACED: 1 % — AB

## 2016-09-25 ENCOUNTER — Ambulatory Visit (INDEPENDENT_AMBULATORY_CARE_PROVIDER_SITE_OTHER): Payer: Medicare HMO | Admitting: Family Medicine

## 2016-09-25 ENCOUNTER — Telehealth: Payer: Self-pay | Admitting: Internal Medicine

## 2016-09-25 ENCOUNTER — Emergency Department (HOSPITAL_COMMUNITY)
Admission: EM | Admit: 2016-09-25 | Discharge: 2016-09-25 | Disposition: A | Discharge status: Home / Self Care | Payer: Medicare HMO | Attending: Emergency Medicine | Admitting: Emergency Medicine

## 2016-09-25 ENCOUNTER — Encounter: Payer: Self-pay | Admitting: Family Medicine

## 2016-09-25 ENCOUNTER — Encounter (HOSPITAL_COMMUNITY): Payer: Self-pay | Admitting: *Deleted

## 2016-09-25 VITALS — BP 136/72 | HR 80 | Temp 98.0°F | Resp 16 | Ht 67.0 in | Wt 176.4 lb

## 2016-09-25 DIAGNOSIS — E875 Hyperkalemia: Secondary | ICD-10-CM | POA: Diagnosis not present

## 2016-09-25 DIAGNOSIS — I5022 Chronic systolic (congestive) heart failure: Secondary | ICD-10-CM | POA: Diagnosis not present

## 2016-09-25 DIAGNOSIS — R799 Abnormal finding of blood chemistry, unspecified: Secondary | ICD-10-CM | POA: Diagnosis present

## 2016-09-25 DIAGNOSIS — Z79899 Other long term (current) drug therapy: Secondary | ICD-10-CM | POA: Insufficient documentation

## 2016-09-25 DIAGNOSIS — Z87891 Personal history of nicotine dependence: Secondary | ICD-10-CM | POA: Diagnosis not present

## 2016-09-25 DIAGNOSIS — M1990 Unspecified osteoarthritis, unspecified site: Secondary | ICD-10-CM | POA: Insufficient documentation

## 2016-09-25 DIAGNOSIS — M159 Polyosteoarthritis, unspecified: Secondary | ICD-10-CM

## 2016-09-25 DIAGNOSIS — I10 Essential (primary) hypertension: Secondary | ICD-10-CM | POA: Diagnosis not present

## 2016-09-25 DIAGNOSIS — N184 Chronic kidney disease, stage 4 (severe): Secondary | ICD-10-CM

## 2016-09-25 DIAGNOSIS — N179 Acute kidney failure, unspecified: Secondary | ICD-10-CM | POA: Insufficient documentation

## 2016-09-25 DIAGNOSIS — E1122 Type 2 diabetes mellitus with diabetic chronic kidney disease: Secondary | ICD-10-CM | POA: Diagnosis not present

## 2016-09-25 DIAGNOSIS — E785 Hyperlipidemia, unspecified: Secondary | ICD-10-CM | POA: Diagnosis not present

## 2016-09-25 DIAGNOSIS — I130 Hypertensive heart and chronic kidney disease with heart failure and stage 1 through stage 4 chronic kidney disease, or unspecified chronic kidney disease: Secondary | ICD-10-CM | POA: Diagnosis not present

## 2016-09-25 LAB — BASIC METABOLIC PANEL
Anion gap: 7 (ref 5–15)
BUN: 70 mg/dL — ABNORMAL HIGH (ref 6–23)
BUN: 74 mg/dL — ABNORMAL HIGH (ref 6–20)
CALCIUM: 9.4 mg/dL (ref 8.4–10.5)
CALCIUM: 9.7 mg/dL (ref 8.9–10.3)
CO2: 20 mEq/L (ref 19–32)
CO2: 21 mmol/L — AB (ref 22–32)
CREATININE: 3.13 mg/dL — AB (ref 0.40–1.50)
CREATININE: 3.35 mg/dL — AB (ref 0.61–1.24)
Chloride: 110 mEq/L (ref 96–112)
Chloride: 113 mmol/L — ABNORMAL HIGH (ref 101–111)
GFR, EST AFRICAN AMERICAN: 19 mL/min — AB (ref >60)
GFR, EST NON AFRICAN AMERICAN: 17 mL/min — AB (ref >60)
GFR: 25.15 mL/min — ABNORMAL LOW (ref >60.00)
Glucose, Bld: 116 mg/dL — ABNORMAL HIGH (ref 70–99)
Glucose, Bld: 105 mg/dL — ABNORMAL HIGH (ref 65–99)
Potassium: 6.1 mEq/L (ref 3.5–5.1)
Potassium: 5.3 mmol/L — ABNORMAL HIGH (ref 3.5–5.1)
SODIUM: 141 mmol/L (ref 135–145)
Sodium: 139 mEq/L (ref 135–145)

## 2016-09-25 LAB — CBC WITH DIFFERENTIAL/PLATELET
BASOS ABS: 0 10*3/uL (ref 0.0–0.1)
BASOS PCT: 0.8 % (ref 0.0–3.0)
EOS ABS: 0.3 10*3/uL (ref 0.0–0.7)
Eosinophils Relative: 4.8 % (ref 0.0–5.0)
HCT: 30.4 % — ABNORMAL LOW (ref 39.0–52.0)
Hemoglobin: 9.8 g/dL — ABNORMAL LOW (ref 13.0–17.0)
LYMPHS ABS: 1.2 10*3/uL (ref 0.7–4.0)
Lymphocytes Relative: 22.2 % (ref 12.0–46.0)
MCHC: 32.2 g/dL (ref 30.0–36.0)
MCV: 85.8 fl (ref 78.0–100.0)
MONO ABS: 0.5 10*3/uL (ref 0.1–1.0)
Monocytes Relative: 9.1 % (ref 3.0–12.0)
NEUTROS ABS: 3.4 10*3/uL (ref 1.4–7.7)
NEUTROS PCT: 63.1 % (ref 43.0–77.0)
PLATELETS: 164 10*3/uL (ref 150.0–400.0)
RBC: 3.55 Mil/uL — ABNORMAL LOW (ref 4.22–5.81)
RDW: 14.2 % (ref 11.5–15.5)
WBC: 5.4 10*3/uL (ref 4.0–10.5)

## 2016-09-25 LAB — TSH: TSH: 3 u[IU]/mL (ref 0.35–4.50)

## 2016-09-25 LAB — LIPID PANEL
CHOL/HDL RATIO: 5
Cholesterol: 149 mg/dL (ref 0–200)
HDL: 32.8 mg/dL — ABNORMAL LOW (ref >39.00)
LDL CALC: 81 mg/dL (ref 0–99)
NONHDL: 115.88
Triglycerides: 176 mg/dL — ABNORMAL HIGH (ref 0.0–149.0)
VLDL: 35.2 mg/dL (ref 0.0–40.0)

## 2016-09-25 LAB — HEPATIC FUNCTION PANEL
ALT: 12 U/L (ref 0–53)
AST: 11 U/L (ref 0–37)
Albumin: 4.1 g/dL (ref 3.5–5.2)
Alkaline Phosphatase: 78 U/L (ref 39–117)
BILIRUBIN DIRECT: 0 mg/dL (ref 0.0–0.3)
BILIRUBIN TOTAL: 0.3 mg/dL (ref 0.2–1.2)
TOTAL PROTEIN: 7.8 g/dL (ref 6.0–8.3)

## 2016-09-25 LAB — HEMOGLOBIN A1C: Hgb A1c MFr Bld: 7.2 % — ABNORMAL HIGH (ref 4.6–6.5)

## 2016-09-25 MED ORDER — SODIUM POLYSTYRENE SULFONATE 15 GM/60ML PO SUSP
15.0000 g | Freq: Once | ORAL | Status: AC
  Administered 2016-09-25: 15 g via ORAL
  Filled 2016-09-25: qty 60

## 2016-09-25 NOTE — ED Notes (Signed)
Pt ambulatory and independent at discharge.  Verbalized understanding of discharge instructions 

## 2016-09-25 NOTE — Telephone Encounter (Signed)
Patient was called by Dr. Derrel Nip and notified that his potassium was critically high at 6.1.  He was advised to go to the [facility] or [facility] tonight to have it lowered given his history of CKD and ischemic cardiomyopathy. He agreed to go.  Patient is taking lisinopril.

## 2016-09-25 NOTE — ED Provider Notes (Signed)
[facility] DEPT Provider Note   CSN: [id number] Arrival date & time: 09/25/16  1944     History   Chief Complaint Chief Complaint  Patient presents with  . Abnormal Lab    HPI Tyler Hahn is a 76 y.o. male.  HPI  Patient sent here by primary doctor secondary to elevated potassium. He's been asymptomatic otherwise. Does admit to not drink as much water as he should. Has normal urine output. No pain. No recent infections. No other associated or modifying symptoms.  Past Medical History:  Diagnosis Date  . Anxiety   . Automatic implantable cardioverter-defibrillator in situ    greg taylor  . CHF (congestive heart failure) ([place])    2000  . Diabetes mellitus    no meds  . Fatty liver   . Gout    "bout 2-3 months ago"-meds helped.  . Hyperlipidemia   . Hypertension   . Hyperthyroidism   . Kidney cysts   . PONV (postoperative nausea and vomiting)   . Renal insufficiency     Patient Active Problem List   Diagnosis Date Noted  . Osteoarthritis 09/25/2016  . Screen for colon cancer   . Benign neoplasm of transverse colon   . Edema 07/28/2014  . Elevated LFTs 07/28/2014  . Adjustment disorder with depressed mood 03/24/2014  . Epigastric pain 01/27/2014  . Small bowel mass 12/20/2013  . Abdominal pain, left upper quadrant 11/25/2013  . Myalgia 07/07/2013  . Hyperthyroidism 05/06/2013  . Loss of weight 04/19/2013  . Polyarthralgia 04/19/2013  . Loss of appetite 04/19/2013  . 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 Right elbow pain 08/17/2012  . 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 SYSTOLIC HEART FAILURE, CHRONIC 06/07/2010  . Chronic kidney disease (CKD), stage IV (severe) ([place]) 04/04/2010  . TESTICULAR MASS 04/04/2010  . DM (diabetes mellitus) type II controlled with renal manifestation ([place]) 03/07/2010  . Hyperlipidemia 03/07/2010  . GOUT 03/07/2010  . HYPERTENSION, BENIGN ESSENTIAL 03/07/2010  . IMPLANTABLE CARDIAC DEFIBRILLATOR -[place]  SCIENTIFIC-SINGLE 03/07/2010    Past Surgical History:  Procedure Laterality Date  . CARDIAC DEFIBRILLATOR PLACEMENT    . COLONOSCOPY WITH PROPOFOL N/A 10/03/2015   Procedure: COLONOSCOPY WITH PROPOFOL;  Surgeon: Ladene Artist, MD;  Location: [facility];  Service: Endoscopy;  Laterality: N/A;  . DIAGNOSTIC LAPAROSCOPY  01/27/2014   Dr Brantley Stage  . ENTEROSCOPY N/A 11/25/2013   Procedure: ENTEROSCOPY;  Surgeon: Ladene Artist, MD;  Location: [facility];  Service: Endoscopy;  Laterality: N/A;  . ICD,[place] [employer]cent[employer]    . LAPAROSCOPY N/A 01/27/2014   Procedure: LAPAROSCOPY DIAGNOSTIC;  Surgeon: Joyice Faster. Cornett, MD;  Location: [place] [facility];  Service: General;  Laterality: N/A;  . polyp removal throat     difficulty speaking       Home Medications    Prior to Admission medications   Medication Sig Start Date End Date Taking? Authorizing Provider  allopurinol (ZYLOPRIM) 100 MG tablet Take 1 tablet (100 mg total) by mouth daily. 05/05/15  Yes [name], MD  atorvastatin (LIPITOR) 20 MG tablet TAKE ONE TABLET BY MOUTH ONCE DAILY 07/09/16  Yes [name], MD  carvedilol (COREG) 12.5 MG tablet TAKE ONE TABLET BY MOUTH TWICE DAILY 01/12/16  Yes [name], MD  fenofibrate 160 MG tablet Take 1 tablet (160 mg total) by mouth daily. 05/20/15  Yes [name], MD  lisinopril (PRINIVIL,ZESTRIL) 20 MG tablet TAKE ONE TABLET BY MOUTH ONCE DAILY 07/09/16  Yes [name], MD  Omega 3 1200 MG CAPS Take 1,200 mg by mouth daily.   Yes Historical Provider, MD  omeprazole (PRILOSEC) 40 MG capsule TAKE ONE CAPSULE BY MOUTH ONCE DAILY 10/24/15  Yes [name], MD  oxyCODONE-acetaminophen (PERCOCET/ROXICET) 5-325 MG per tablet Take 1-2 tablets by mouth every 6 (six) hours as needed for severe pain. 01/28/14  Yes Nat Christen, PA-C  sertraline (ZOLOFT) 25 MG tablet TAKE ONE TABLET BY MOUTH ONCE DAILY 01/30/16  Yes [name], MD  torsemide (DEMADEX) 20 MG tablet TAKE  ONE TABLET BY MOUTH ONCE DAILY 04/23/16  Yes [name], MD    Family History Family History  Problem Relation Age of Onset  . Stomach cancer Mother   . Alzheimer's disease Mother   . Diabetes Father   . Heart disease Father   . Liver disease Father   . Kidney disease Father     Social History Social History  Substance Use Topics  . Smoking status: Former Smoker    Types: Cigarettes    Quit date: 11/26/1998  . Smokeless tobacco: Never Used  . Alcohol use No     Comment: quit  2008     Allergies   Sulfonamide derivatives   Review of Systems Review of Systems  All other systems reviewed and are negative.    Physical Exam Updated Vital Signs BP (!) 157/72 (BP Location: Left Arm)   Pulse 63   Temp 97.8 F (36.6 C) (Oral)   Resp 18   Ht 5\' 7"  (1.702 m)   Wt 177 lb (80.3 kg)   SpO2 100%   BMI 27.72 kg/m   Physical Exam  Constitutional: He is oriented to person, place, and time. He appears well-developed and well-nourished.  HENT:  Head: Normocephalic and atraumatic.  Eyes: Conjunctivae and EOM are normal.  Neck: Normal range of motion.  Cardiovascular: Normal rate.   Pulmonary/Chest: Effort normal. No respiratory distress. He has no wheezes.  Abdominal: Soft. He exhibits no distension.  Musculoskeletal: Normal range of motion.  Neurological: He is alert and oriented to person, place, and time. No cranial nerve deficit. Coordination normal.  Skin: Skin is warm and dry.  Nursing note and vitals reviewed.    ED Treatments / Results  Labs (all labs ordered are listed, but only abnormal results are displayed) Labs Reviewed  BASIC METABOLIC PANEL - Abnormal; Notable for the following:       Result Value   Potassium 5.3 (*)    Chloride 113 (*)    CO2 21 (*)    Glucose, Bld 105 (*)    BUN 74 (*)    Creatinine, Ser 3.35 (*)    GFR calc non Af Amer 17 (*)    GFR calc Af Amer 19 (*)    All other components within normal limits    EKG  EKG  Interpretation None       Radiology No results found.  Procedures Procedures (including critical care time)  Medications Ordered in ED Medications  sodium polystyrene (KAYEXALATE) 15 GM/60ML suspension 15 g (15 g Oral Given 09/25/16 2225)     Initial Impression / Assessment and Plan / ED Course  I have reviewed the triage vital signs and the nursing notes.  Pertinent labs & imaging results that were available during my care of the patient were reviewed by me and considered in my medical decision making (see chart for details).     Repeat potassium here was 5.3. His BUN and creatinine are  consistently elevated as well. He will need a follow-up with Dr. for recheck of labs and a couple days to make sure these are improving. I discussed ways to get more water in his diet. Is still making urine so no acute cause for dialysis. Stable for discharge at this time.  Final Clinical Impressions(s) / ED Diagnoses   Final diagnoses:  AKI (acute kidney injury) ([place])  Hyperkalemia    New Prescriptions Discharge Medication List as of 09/25/2016 10:12 PM       Merrily Pew, MD 09/25/16 2334

## 2016-09-25 NOTE — ED Notes (Signed)
ED Provider at bedside. 

## 2016-09-25 NOTE — ED Triage Notes (Signed)
Pt sent here by PCP after labs drawn, K 6.1.

## 2016-09-25 NOTE — Patient Instructions (Signed)
Schedule your complete physical and your Annual Wellness Visit w/ Tyler Hahn at the same time [facility] notify you of your lab results and make any changes if needed Make sure you are drinking enough fluids and eating a diet high in potassium- bananas, citrus fruits, leafy greens Start taking Tylenol (Acetaminophen) 650mg  3x/day for the arthritis pain Use a heating bad for your back as needed for pain relief Call with any questions or concerns Hang in there!!!

## 2016-09-25 NOTE — Progress Notes (Signed)
Pre visit review using our clinic review tool, if applicable. No additional management support is needed unless otherwise documented below in the visit note. 

## 2016-09-25 NOTE — Progress Notes (Signed)
   Subjective:    Patient ID: Tyler Hahn, male    DOB: 1940-08-16, 76 y.o.   MRN: [id number]  HPI DM- chronic problem.  Pt was seeing Dr Loanne Drilling but has not been seen since 03/2015.  UTD on eye exam.  Due for foot exam.  On ACE for renal protection.  Previously on insulin.  Not currently on any medication.  No numbness/tingling of hands/feet.  Denies symptomatic lows.  Hyperlipidemia- chronic problem.  On Lipitor and fenofibrate daily.  Due for labs.  No abd pain, N/V.  HTN- chronic problem, adequate control on Coreg and Lisinopril.  No CP, SOB, HAs, visual changes, edema.  Leg pain- L sided.  Having 'cramping' sensation nightly that wakes him between 2-3am.  Pt reports sxs improve when he eats banana prior to bed.  Will also have L hip pain after walking for any longer distance.  Pain improves w/ rest.   Review of Systems For ROS see HPI     Objective:   Physical Exam  Constitutional: He is oriented to person, place, and time. He appears well-developed and well-nourished. No distress.  HENT:  Head: Normocephalic and atraumatic.  Eyes: Conjunctivae and EOM are normal. Pupils are equal, round, and reactive to light.  Neck: Normal range of motion. Neck supple. No thyromegaly present.  Cardiovascular: Normal rate, regular rhythm, normal heart sounds and intact distal pulses.   No murmur heard. Pulmonary/Chest: Effort normal and breath sounds normal. No respiratory distress.  Abdominal: Soft. Bowel sounds are normal. He exhibits no distension.  Musculoskeletal: He exhibits no edema.  Lymphadenopathy:    He has no cervical adenopathy.  Neurological: He is alert and oriented to person, place, and time. No cranial nerve deficit.  Skin: Skin is warm and dry.  Psychiatric: He has a normal mood and affect. His behavior is normal.  Vitals reviewed.         Assessment & Plan:

## 2016-09-26 NOTE — Assessment & Plan Note (Signed)
New.  Pt reports having L hip pain and LBP pain w/ walking.  Pain improves w/ rest.  Also having intermittent pain in both hands.  Suspect OA of multiple sites.  Start w/ OTC Tylenol and monitor for improvement.  Pt expressed understanding and is in agreement w/ plan.

## 2016-09-26 NOTE — Assessment & Plan Note (Signed)
Chronic problem.  On Lipitor and Fenofibrate w/o difficulty.  Check labs.  Adjust meds prn

## 2016-09-26 NOTE — Assessment & Plan Note (Signed)
Chronic problem.  Was previously seeing Endo but is well overdue for f/u.  UTD on eye exam.  Foot exam done today.  On ACE for renal protection.  Was previously on insulin but is not currently taking.  Check labs and determine new regimen for him.

## 2016-09-26 NOTE — Assessment & Plan Note (Signed)
Chronic problem.  Adequate control today.  Asymptomatic.  Check labs.  No anticipated med changes.  Will follow. 

## 2016-09-27 ENCOUNTER — Other Ambulatory Visit (INDEPENDENT_AMBULATORY_CARE_PROVIDER_SITE_OTHER): Payer: Medicare HMO

## 2016-09-27 ENCOUNTER — Other Ambulatory Visit: Payer: Self-pay | Admitting: Family Medicine

## 2016-09-27 DIAGNOSIS — E875 Hyperkalemia: Secondary | ICD-10-CM

## 2016-09-27 LAB — BASIC METABOLIC PANEL
BUN: 59 mg/dL — AB (ref 6–23)
CHLORIDE: 107 meq/L (ref 96–112)
CO2: 21 meq/L (ref 19–32)
Calcium: 9.1 mg/dL (ref 8.4–10.5)
Creatinine, Ser: 2.86 mg/dL — ABNORMAL HIGH (ref 0.40–1.50)
GFR: 27.91 mL/min — ABNORMAL LOW (ref >60.00)
Glucose, Bld: 118 mg/dL — ABNORMAL HIGH (ref 70–99)
Potassium: 4.9 mEq/L (ref 3.5–5.1)
Sodium: 135 mEq/L (ref 135–145)

## 2016-09-30 ENCOUNTER — Encounter: Payer: Self-pay | Admitting: General Practice

## 2016-10-08 DIAGNOSIS — E875 Hyperkalemia: Secondary | ICD-10-CM | POA: Diagnosis not present

## 2016-10-11 ENCOUNTER — Other Ambulatory Visit: Payer: Self-pay | Admitting: Family Medicine

## 2016-10-22 ENCOUNTER — Other Ambulatory Visit: Payer: Self-pay | Admitting: Family Medicine

## 2016-11-04 ENCOUNTER — Other Ambulatory Visit: Payer: Self-pay | Admitting: Family Medicine

## 2016-12-16 DIAGNOSIS — N184 Chronic kidney disease, stage 4 (severe): Secondary | ICD-10-CM | POA: Diagnosis not present

## 2016-12-17 ENCOUNTER — Ambulatory Visit (INDEPENDENT_AMBULATORY_CARE_PROVIDER_SITE_OTHER): Payer: Medicare HMO | Admitting: *Deleted

## 2016-12-17 DIAGNOSIS — I428 Other cardiomyopathies: Secondary | ICD-10-CM | POA: Diagnosis not present

## 2016-12-17 NOTE — Progress Notes (Signed)
Remote ICD transmission.   

## 2016-12-18 ENCOUNTER — Other Ambulatory Visit: Payer: Self-pay | Admitting: Family Medicine

## 2016-12-20 ENCOUNTER — Encounter: Payer: Self-pay | Admitting: Cardiology

## 2016-12-24 DIAGNOSIS — I10 Essential (primary) hypertension: Secondary | ICD-10-CM | POA: Diagnosis not present

## 2016-12-24 DIAGNOSIS — E872 Acidosis: Secondary | ICD-10-CM | POA: Diagnosis not present

## 2016-12-24 DIAGNOSIS — N184 Chronic kidney disease, stage 4 (severe): Secondary | ICD-10-CM | POA: Diagnosis not present

## 2016-12-24 DIAGNOSIS — E875 Hyperkalemia: Secondary | ICD-10-CM | POA: Diagnosis not present

## 2016-12-24 DIAGNOSIS — D631 Anemia in chronic kidney disease: Secondary | ICD-10-CM | POA: Diagnosis not present

## 2016-12-24 DIAGNOSIS — N2581 Secondary hyperparathyroidism of renal origin: Secondary | ICD-10-CM | POA: Diagnosis not present

## 2017-01-01 LAB — CUP PACEART REMOTE DEVICE CHECK
Date Time Interrogation Session: 20180703043100
HighPow Impedance: 47 Ohm
Implantable Lead Implant Date: 20091218
Implantable Lead Location: 753859
Implantable Lead Location: 753860
Implantable Pulse Generator Implant Date: 20091218
Lead Channel Impedance Value: 508 Ohm
Lead Channel Pacing Threshold Amplitude: 0.4 V
Lead Channel Pacing Threshold Pulse Width: 0.5 ms
Lead Channel Setting Sensing Sensitivity: 0.6 mV
MDC IDC LEAD IMPLANT DT: 20091218
MDC IDC LEAD SERIAL: 131093
MDC IDC MSMT BATTERY REMAINING LONGEVITY: 48 mo
MDC IDC MSMT BATTERY REMAINING PERCENTAGE: 54 %
MDC IDC MSMT LEADCHNL RA IMPEDANCE VALUE: 374 Ohm
MDC IDC MSMT LEADCHNL RA PACING THRESHOLD AMPLITUDE: 0.6 V
MDC IDC MSMT LEADCHNL RA PACING THRESHOLD PULSEWIDTH: 0.5 ms
MDC IDC SET LEADCHNL RA PACING AMPLITUDE: 2 V
MDC IDC SET LEADCHNL RV PACING AMPLITUDE: 2.4 V
MDC IDC SET LEADCHNL RV PACING PULSEWIDTH: 0.5 ms
MDC IDC STAT BRADY RA PERCENT PACED: 6 %
MDC IDC STAT BRADY RV PERCENT PACED: 0 %
Pulse Gen Serial Number: 131093

## 2017-01-07 ENCOUNTER — Encounter: Payer: Self-pay | Admitting: Cardiology

## 2017-01-20 ENCOUNTER — Other Ambulatory Visit: Payer: Self-pay | Admitting: Family Medicine

## 2017-01-30 ENCOUNTER — Other Ambulatory Visit: Payer: Self-pay | Admitting: Family Medicine

## 2017-03-18 ENCOUNTER — Ambulatory Visit (INDEPENDENT_AMBULATORY_CARE_PROVIDER_SITE_OTHER): Payer: Medicare HMO | Admitting: *Deleted

## 2017-03-18 DIAGNOSIS — I428 Other cardiomyopathies: Secondary | ICD-10-CM | POA: Diagnosis not present

## 2017-03-18 NOTE — Progress Notes (Signed)
Remote ICD transmission.   

## 2017-03-19 ENCOUNTER — Encounter: Payer: Self-pay | Admitting: Cardiology

## 2017-03-20 LAB — CUP PACEART REMOTE DEVICE CHECK
Battery Remaining Percentage: 51 %
Brady Statistic RA Percent Paced: 9 %
Date Time Interrogation Session: 20181002043100
HighPow Impedance: 50 Ohm
Implantable Lead Implant Date: 20091218
Implantable Lead Location: 753859
Implantable Lead Location: 753860
Lead Channel Impedance Value: 383 Ohm
Lead Channel Impedance Value: 527 Ohm
Lead Channel Pacing Threshold Amplitude: 0.4 V
Lead Channel Pacing Threshold Pulse Width: 0.5 ms
Lead Channel Setting Pacing Amplitude: 2 V
Lead Channel Setting Pacing Amplitude: 2.4 V
Lead Channel Setting Pacing Pulse Width: 0.5 ms
MDC IDC LEAD IMPLANT DT: 20091218
MDC IDC LEAD SERIAL: 131093
MDC IDC MSMT BATTERY REMAINING LONGEVITY: 42 mo
MDC IDC MSMT LEADCHNL RA PACING THRESHOLD AMPLITUDE: 0.6 V
MDC IDC MSMT LEADCHNL RV PACING THRESHOLD PULSEWIDTH: 0.5 ms
MDC IDC PG IMPLANT DT: 20091218
MDC IDC SET LEADCHNL RV SENSING SENSITIVITY: 0.6 mV
MDC IDC STAT BRADY RV PERCENT PACED: 0 %
Pulse Gen Serial Number: 131093

## 2017-04-04 ENCOUNTER — Encounter: Payer: Self-pay | Admitting: Cardiology

## 2017-05-06 ENCOUNTER — Other Ambulatory Visit: Payer: Self-pay | Admitting: Family Medicine

## 2017-06-09 ENCOUNTER — Other Ambulatory Visit: Payer: Self-pay | Admitting: Family Medicine

## 2017-06-18 ENCOUNTER — Ambulatory Visit (INDEPENDENT_AMBULATORY_CARE_PROVIDER_SITE_OTHER): Payer: Medicare HMO | Admitting: *Deleted

## 2017-06-18 DIAGNOSIS — I428 Other cardiomyopathies: Secondary | ICD-10-CM

## 2017-06-19 NOTE — Progress Notes (Signed)
Remote ICD transmission.   

## 2017-06-20 ENCOUNTER — Encounter: Payer: Self-pay | Admitting: Cardiology

## 2017-06-27 ENCOUNTER — Emergency Department (HOSPITAL_COMMUNITY)
Admission: EM | Admit: 2017-06-27 | Discharge: 2017-06-27 | Disposition: A | Discharge status: Home / Self Care | Payer: Medicare HMO | Attending: Emergency Medicine | Admitting: Emergency Medicine

## 2017-06-27 ENCOUNTER — Emergency Department (HOSPITAL_COMMUNITY): Payer: Medicare HMO

## 2017-06-27 ENCOUNTER — Encounter (HOSPITAL_COMMUNITY): Payer: Self-pay

## 2017-06-27 ENCOUNTER — Other Ambulatory Visit: Payer: Self-pay

## 2017-06-27 DIAGNOSIS — I130 Hypertensive heart and chronic kidney disease with heart failure and stage 1 through stage 4 chronic kidney disease, or unspecified chronic kidney disease: Secondary | ICD-10-CM | POA: Diagnosis not present

## 2017-06-27 DIAGNOSIS — M25562 Pain in left knee: Secondary | ICD-10-CM | POA: Diagnosis not present

## 2017-06-27 DIAGNOSIS — S8992XA Unspecified injury of left lower leg, initial encounter: Secondary | ICD-10-CM | POA: Diagnosis not present

## 2017-06-27 DIAGNOSIS — M25511 Pain in right shoulder: Secondary | ICD-10-CM | POA: Diagnosis not present

## 2017-06-27 DIAGNOSIS — N184 Chronic kidney disease, stage 4 (severe): Secondary | ICD-10-CM | POA: Insufficient documentation

## 2017-06-27 DIAGNOSIS — E1122 Type 2 diabetes mellitus with diabetic chronic kidney disease: Secondary | ICD-10-CM | POA: Diagnosis not present

## 2017-06-27 DIAGNOSIS — S8990XA Unspecified injury of unspecified lower leg, initial encounter: Secondary | ICD-10-CM | POA: Diagnosis not present

## 2017-06-27 DIAGNOSIS — I5022 Chronic systolic (congestive) heart failure: Secondary | ICD-10-CM | POA: Insufficient documentation

## 2017-06-27 DIAGNOSIS — M109 Gout, unspecified: Secondary | ICD-10-CM | POA: Diagnosis not present

## 2017-06-27 DIAGNOSIS — S8991XA Unspecified injury of right lower leg, initial encounter: Secondary | ICD-10-CM | POA: Diagnosis not present

## 2017-06-27 DIAGNOSIS — M25561 Pain in right knee: Secondary | ICD-10-CM | POA: Diagnosis not present

## 2017-06-27 DIAGNOSIS — M1039 Gout due to renal impairment, multiple sites: Secondary | ICD-10-CM | POA: Diagnosis not present

## 2017-06-27 DIAGNOSIS — Z87891 Personal history of nicotine dependence: Secondary | ICD-10-CM | POA: Diagnosis not present

## 2017-06-27 DIAGNOSIS — M1990 Unspecified osteoarthritis, unspecified site: Secondary | ICD-10-CM | POA: Diagnosis not present

## 2017-06-27 DIAGNOSIS — Z79899 Other long term (current) drug therapy: Secondary | ICD-10-CM | POA: Insufficient documentation

## 2017-06-27 LAB — COMPREHENSIVE METABOLIC PANEL
ALBUMIN: 3.6 g/dL (ref 3.5–5.0)
ALT: 19 U/L (ref 17–63)
ANION GAP: 12 (ref 5–15)
AST: 28 U/L (ref 15–41)
Alkaline Phosphatase: 59 U/L (ref 38–126)
BILIRUBIN TOTAL: 0.9 mg/dL (ref 0.3–1.2)
BUN: 74 mg/dL — ABNORMAL HIGH (ref 6–20)
CALCIUM: 10 mg/dL (ref 8.9–10.3)
CO2: 20 mmol/L — ABNORMAL LOW (ref 22–32)
Chloride: 105 mmol/L (ref 101–111)
Creatinine, Ser: 3.45 mg/dL — ABNORMAL HIGH (ref 0.61–1.24)
GFR calc non Af Amer: 16 mL/min — ABNORMAL LOW (ref >60)
GFR, EST AFRICAN AMERICAN: 19 mL/min — AB (ref >60)
GLUCOSE: 93 mg/dL (ref 65–99)
POTASSIUM: 5.2 mmol/L — AB (ref 3.5–5.1)
SODIUM: 137 mmol/L (ref 135–145)
Total Protein: 9 g/dL — ABNORMAL HIGH (ref 6.5–8.1)

## 2017-06-27 LAB — SEDIMENTATION RATE: Sed Rate: 109 mm/hr — ABNORMAL HIGH (ref 0–16)

## 2017-06-27 LAB — CBC WITH DIFFERENTIAL/PLATELET
BASOS PCT: 0 %
Basophils Absolute: 0 10*3/uL (ref 0.0–0.1)
EOS ABS: 0.1 10*3/uL (ref 0.0–0.7)
Eosinophils Relative: 1 %
HEMATOCRIT: 31.7 % — AB (ref 39.0–52.0)
HEMOGLOBIN: 10.6 g/dL — AB (ref 13.0–17.0)
LYMPHS ABS: 1 10*3/uL (ref 0.7–4.0)
Lymphocytes Relative: 12 %
MCH: 27.6 pg (ref 26.0–34.0)
MCHC: 33.4 g/dL (ref 30.0–36.0)
MCV: 82.6 fL (ref 78.0–100.0)
MONO ABS: 0.7 10*3/uL (ref 0.1–1.0)
MONOS PCT: 8 %
Neutro Abs: 7.2 10*3/uL (ref 1.7–7.7)
Neutrophils Relative %: 79 %
Platelets: 261 10*3/uL (ref 150–400)
RBC: 3.84 MIL/uL — ABNORMAL LOW (ref 4.22–5.81)
RDW: 13.3 % (ref 11.5–15.5)
WBC: 9 10*3/uL (ref 4.0–10.5)

## 2017-06-27 MED ORDER — HYDROMORPHONE HCL 1 MG/ML IJ SOLN
0.5000 mg | Freq: Once | INTRAMUSCULAR | Status: AC
  Administered 2017-06-27: 0.5 mg via INTRAVENOUS
  Filled 2017-06-27: qty 1

## 2017-06-27 MED ORDER — PREDNISONE 20 MG PO TABS: 40.0000 mg | ORAL_TABLET | Freq: Every day | ORAL | 0 refills | Status: DC

## 2017-06-27 MED ORDER — OXYCODONE-ACETAMINOPHEN 5-325 MG PO TABS
1.0000 | ORAL_TABLET | Freq: Once | ORAL | Status: AC
  Administered 2017-06-27: 1 via ORAL
  Filled 2017-06-27: qty 1

## 2017-06-27 MED ORDER — OXYCODONE-ACETAMINOPHEN 5-325 MG PO TABS: 1.0000 | ORAL_TABLET | Freq: Four times a day (QID) | ORAL | 0 refills | Status: DC | PRN

## 2017-06-27 MED ORDER — PREDNISONE 20 MG PO TABS
60.0000 mg | ORAL_TABLET | Freq: Once | ORAL | Status: AC
  Administered 2017-06-27: 60 mg via ORAL
  Filled 2017-06-27: qty 3

## 2017-06-27 MED ORDER — ONDANSETRON HCL 4 MG/2ML IJ SOLN
4.0000 mg | Freq: Once | INTRAMUSCULAR | Status: AC
Start: 2017-06-27 — End: 2017-06-27
  Administered 2017-06-27: 4 mg via INTRAVENOUS
  Filled 2017-06-27: qty 2

## 2017-06-27 NOTE — ED Triage Notes (Signed)
Provider in room assessing pt.

## 2017-06-27 NOTE — ED Notes (Signed)
PT WAITING FOR SON TO PICK UP PT TO GO HOME.

## 2017-06-27 NOTE — Care Management Note (Signed)
Case Management Note  Patient Details  Name: Tyler Hahn MRN: [id number] Date of Birth: [birth year]  CM consulted for DME walker.  Contacted Santiago Glad with [facility] who stated she would bring the walker to the pt prior to leaving the ED.  Updated Dr. Maryan Rued and primary RN.  No further CM needs noted at this time.  Expected Discharge Date:   06/27/2017               Expected Discharge Plan:  Home/Self Care  Discharge planning Services  CM Consult  Post Acute Care Choice:  Durable Medical Equipment Choice offered to:  Patient  DME Arranged:  Gilford Rile rolling DME Agency:  [facility].  Status of Service:  Completed, signed off  Rae Mar, RN 06/27/2017, 3:57 PM

## 2017-06-27 NOTE — Discharge Instructions (Signed)
Make sure you avoid any ibuprofen, naproxen, aleve or advil

## 2017-06-27 NOTE — ED Triage Notes (Signed)
PT not in room went for imaging.

## 2017-06-27 NOTE — ED Triage Notes (Signed)
Pt is alert and orinted x 4 and is verbally responsive. Pt reports having bilateral LE 10/10 stinging and throbbing pain. Pt also reports rt shoulder pain in which he sustained during a fall yesterday while trying  to position his crutches. Pt son is at bedside.

## 2017-06-27 NOTE — ED Triage Notes (Signed)
PTAR brought patient in from home for bilat gout in knees that is very painful and hasnt ben able to walk or stand since yesterday. Patient currently living alone at this time.

## 2017-06-27 NOTE — ED Provider Notes (Addendum)
[facility] DEPT Provider Note   CSN: [id number] Arrival date & time: 06/27/17  1127     History   Chief Complaint Chief Complaint  Patient presents with  . Gout    HPI Tyler Hahn is a 77 y.o. male.  Patient is a 77 year old male with a history of CHF, diabetes, hypertension, hyperlipidemia, CKD, gout who is presenting today with severe gout flare.  Patient states he has had gout for years but in the last week he has developed gout in both of his knees in addition to the gout in his right wrist and right ankle.  The pain is been so bad he is having difficulty moving around his home which he lives alone.  He even had a fall 2 days ago where he was trying to get out of his recliner and fell on his right shoulder.  Patient had some pain medication that he was taking at home but is not sure what it was but it was not helping.  He denies fever, shortness of breath, cough or chest pain.  He has had no abdominal pain vomiting or diarrhea.  He is taking all of his medications as prescribed and states he is trying to follow a diet low in purines and does not drink alcohol.  Patient is taking allopurinol but unclear what pain medication he is taking.   The history is provided by the patient.    Past Medical History:  Diagnosis Date  . Anxiety   . Automatic implantable cardioverter-defibrillator in situ    greg taylor  . CHF (congestive heart failure) ([place])    2000  . Diabetes mellitus    no meds  . Fatty liver   . Gout    "bout 2-3 months ago"-meds helped.  . Hyperlipidemia   . Hypertension   . Hyperthyroidism   . Kidney cysts   . PONV (postoperative nausea and vomiting)   . Renal insufficiency     Patient Active Problem List   Diagnosis Date Noted  . Osteoarthritis 09/25/2016  . Screen for colon cancer   . Benign neoplasm of transverse colon   . Edema 07/28/2014  . Elevated LFTs 07/28/2014  . Adjustment disorder with depressed mood  03/24/2014  . Epigastric pain 01/27/2014  . Small bowel mass 12/20/2013  . Abdominal pain, left upper quadrant 11/25/2013  . Myalgia 07/07/2013  . Hyperthyroidism 05/06/2013  . Loss of weight 04/19/2013  . Polyarthralgia 04/19/2013  . Loss of appetite 04/19/2013  . 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 Right elbow pain 08/17/2012  . 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 SYSTOLIC HEART FAILURE, CHRONIC 06/07/2010  . Chronic kidney disease (CKD), stage IV (severe) ([place]) 04/04/2010  . TESTICULAR MASS 04/04/2010  . DM (diabetes mellitus) type II controlled with renal manifestation ([place]) 03/07/2010  . Hyperlipidemia 03/07/2010  . GOUT 03/07/2010  . HYPERTENSION, BENIGN ESSENTIAL 03/07/2010  . IMPLANTABLE CARDIAC DEFIBRILLATOR -[employer]-SINGLE 03/07/2010    Past Surgical History:  Procedure Laterality Date  . CARDIAC DEFIBRILLATOR PLACEMENT    . COLONOSCOPY WITH PROPOFOL N/A 10/03/2015   Procedure: COLONOSCOPY WITH PROPOFOL;  Surgeon: Ladene Artist, MD;  Location: [facility];  Service: Endoscopy;  Laterality: N/A;  . DIAGNOSTIC LAPAROSCOPY  01/27/2014   Dr Brantley Stage  . ENTEROSCOPY N/A 11/25/2013   Procedure: ENTEROSCOPY;  Surgeon: Ladene Artist, MD;  Location: [facility];  Service: Endoscopy;  Laterality: N/A;  . ICD,[employer]    . LAPAROSCOPY N/A 01/27/2014   Procedure: LAPAROSCOPY  DIAGNOSTIC;  Surgeon: Joyice Faster. Cornett, MD;  Location: [facility];  Service: General;  Laterality: N/A;  . polyp removal throat     difficulty speaking       Home Medications    Prior to Admission medications   Medication Sig Start Date End Date Taking? Authorizing Provider  allopurinol (ZYLOPRIM) 100 MG tablet Take 1 tablet (100 mg total) by mouth daily. 05/05/15   [name], MD  atorvastatin (LIPITOR) 20 MG tablet TAKE 1 TABLET BY MOUTH ONCE DAILY 05/07/17   [name], MD  carvedilol (COREG) 12.5 MG tablet TAKE ONE TABLET BY MOUTH TWICE DAILY 12/19/16    [name], MD  fenofibrate 160 MG tablet Take 1 tablet (160 mg total) by mouth daily. 05/20/15   [name], MD  lisinopril (PRINIVIL,ZESTRIL) 20 MG tablet TAKE 1 TABLET BY MOUTH ONCE DAILY 06/09/17   [name], MD  Omega 3 1200 MG CAPS Take 1,200 mg by mouth daily.    [provider]  omeprazole (PRILOSEC) 40 MG capsule TAKE ONE CAPSULE BY MOUTH ONCE DAILY 11/04/16   [name], MD  oxyCODONE-acetaminophen (PERCOCET/ROXICET) 5-325 MG per tablet Take 1-2 tablets by mouth every 6 (six) hours as needed for severe pain. 01/28/14   Nat Christen, PA-C  sertraline (ZOLOFT) 25 MG tablet TAKE 1 TABLET BY MOUTH ONCE DAILY 06/09/17   [name], MD  torsemide (DEMADEX) 20 MG tablet TAKE 1 TABLET BY MOUTH ONCE DAILY 06/09/17   [name], MD    Family History Family History  Problem Relation Age of Onset  . Stomach cancer Mother   . Alzheimer's disease Mother   . Diabetes Father   . Heart disease Father   . Liver disease Father   . Kidney disease Father     Social History Social History   Tobacco Use  . Smoking status: Former Smoker    Types: Cigarettes    Last attempt to quit: 11/26/1998    Years since quitting: 18.5  . Smokeless tobacco: Never Used  Substance Use Topics  . Alcohol use: No    Alcohol/week: 0.0 oz    Comment: quit  2008  . Drug use: No     Allergies   Sulfonamide derivatives   Review of Systems Review of Systems  All other systems reviewed and are negative.    Physical Exam Updated Vital Signs BP (!) 160/75 (BP Location: Left Arm)   Pulse 78   Temp (!) 97.5 F (36.4 C) (Oral)   Resp 16   Ht 5\' 6"  (1.676 m)   Wt 78.9 kg (174 lb)   SpO2 100%   BMI 28.08 kg/m   Physical Exam  Constitutional: He is oriented to person, place, and time. He appears well-developed and well-nourished. No distress.  HENT:  Head: Normocephalic and atraumatic.  Mouth/Throat: Oropharynx is clear and moist.  Eyes:  Conjunctivae and EOM are normal. Pupils are equal, round, and reactive to light.  Neck: Normal range of motion. Neck supple.  Cardiovascular: Normal rate, regular rhythm and intact distal pulses.  No murmur heard. Pulmonary/Chest: Effort normal and breath sounds normal. No respiratory distress. He has no wheezes. He has no rales.  Abdominal: Soft. He exhibits no distension. There is no tenderness. There is no rebound and no guarding.  Musculoskeletal: He exhibits tenderness. He exhibits no edema.       Right shoulder: He exhibits decreased range of motion, tenderness and bony tenderness. He exhibits  no swelling, no effusion, normal pulse and normal strength.       Right knee: He exhibits decreased range of motion and swelling. He exhibits no ecchymosis. Tenderness found. Medial joint line and lateral joint line tenderness noted.       Left knee: He exhibits decreased range of motion and effusion. He exhibits no erythema. Tenderness found. Medial joint line and lateral joint line tenderness noted.       Arms:      Legs: Neurological: He is alert and oriented to person, place, and time.  Skin: Skin is warm and dry. No rash noted. No erythema.  Psychiatric: He has a normal mood and affect. His behavior is normal.  Nursing note and vitals reviewed.    ED Treatments / Results  Labs (all labs ordered are listed, but only abnormal results are displayed) Labs Reviewed  CBC WITH DIFFERENTIAL/PLATELET - Abnormal; Notable for the following components:      Result Value   RBC 3.84 (*)    Hemoglobin 10.6 (*)    HCT 31.7 (*)    All other components within normal limits  COMPREHENSIVE METABOLIC PANEL - Abnormal; Notable for the following components:   Potassium 5.2 (*)    CO2 20 (*)    BUN 74 (*)    Creatinine, Ser 3.45 (*)    Total Protein 9.0 (*)    GFR calc non Af Amer 16 (*)    GFR calc Af Amer 19 (*)    All other components within normal limits  SEDIMENTATION RATE - Abnormal; Notable for  the following components:   Sed Rate 109 (*)    All other components within normal limits    EKG  EKG Interpretation None       Radiology Dg Shoulder Right  Result Date: 06/27/2017 CLINICAL DATA:  Fall from recliner yesterday.  Right shoulder pain. EXAM: RIGHT SHOULDER - 2+ VIEW COMPARISON:  None. FINDINGS: There is no evidence of fracture or dislocation. There is no evidence of arthropathy or other focal bone abnormality. Soft tissues are unremarkable. IMPRESSION: Negative right shoulder radiographs. Electronically Signed   By: San Morelle M.D.   On: 06/27/2017 12:54   Dg Knee Complete 4 Views Left  Result Date: 06/27/2017 CLINICAL DATA:  Fall from ladder.  Knee pain. EXAM: LEFT KNEE - COMPLETE 4+ VIEW COMPARISON:  None. FINDINGS: Knee is located. No acute fracture is present. A large effusion is noted. Anteromedial soft tissue swelling is present as well. Vascular calcifications are present. IMPRESSION: 1. Large knee effusion without fracture. Ligamentous injury is not excluded. 2. Atherosclerosis. Electronically Signed   By: San Morelle M.D.   On: 06/27/2017 12:55   Dg Knee Complete 4 Views Right  Result Date: 06/27/2017 CLINICAL DATA:  Pain following fall EXAM: RIGHT KNEE - COMPLETE 4+ VIEW COMPARISON:  None. FINDINGS: Frontal, lateral, and bilateral oblique views were obtained. There is no evident acute fracture or dislocation. No joint effusion. There is mild joint space narrowing laterally. There is mild spurring in all compartments. There are scattered foci of chondrocalcinosis. There is calcification in the distal superficial femoral artery and popliteal artery regions. IMPRESSION: Relatively mild but somewhat generalized osteoarthritic change. Foci of chondrocalcinosis are noted, a finding which may be seen with osteoarthritis or with calcium pyrophosphate deposition disease. No fracture or joint effusion. Popliteal artery and distal superficial femoral artery  atherosclerosis. Electronically Signed   By: Lowella Grip III M.D.   On: 06/27/2017 12:56    Procedures Procedures (  including critical care time)  Medications Ordered in ED Medications  HYDROmorphone (DILAUDID) injection 0.5 mg (0.5 mg Intravenous Given 06/27/17 1323)  ondansetron (ZOFRAN) injection 4 mg (4 mg Intravenous Given 06/27/17 1324)     Initial Impression / Assessment and Plan / ED Course  I have reviewed the triage vital signs and the nursing notes.  Pertinent labs & imaging results that were available during my care of the patient were reviewed by me and considered in my medical decision making (see chart for details).     Patient is an elderly male presenting today with diffuse gout and severe pain.  Patient has had several falls at home related to the debilitating pain.  He denies any infectious symptoms or systemic symptoms.  Vital signs are relatively normal on exam except for some mild hypertension.  Unclear what pain medication patient is taking at home but does have evidence of gout in bilateral knees, right ankle with swelling and pain.  Left knee seems to be the worst.  Patient states the right knee was worse but it is now moved into the left knee.  He does take allopurinol.  Given patient's prior history of cardiac disease, CKD he is not a great candidate for NSAIDs. Patient given IV pain medication, labs are pending.  X-ray shows a left knee effusion and some arthritis in the right knee but no evidence of injury from his fall on his left shoulder.  Low suspicion for septic joint at this time.  No prior history of rheumatoid arthritis.   2:52 PM Patient CBC without acute findings.  CMP with a creatinine of 3.45 and GFR of 19.  This seems to be within his range of normal over the last few years.  Sed rate is elevated at 108 but is most likely from the gout.  After pain medication patient feels significantly better.  We will plan on discharging home with pain control  and prednisone.  Warned patient to not use any type of NSAIDs.  Requested that patient follow-up with his doctor early next week.  Patient does have a wheelchair at home however he was trying to get around with crutches and recommended that a walker would be better.  Will give a ppx.  Final Clinical Impressions(s) / ED Diagnoses   Final diagnoses:  Acute gout due to renal impairment involving multiple sites    ED Discharge Orders        Ordered    oxyCODONE-acetaminophen (PERCOCET/ROXICET) 5-325 MG tablet  Every 6 hours PRN     06/27/17 1454    predniSONE (DELTASONE) 20 MG tablet  Daily     06/27/17 1454       Blanchie Dessert, MD 06/27/17 1455    Blanchie Dessert, MD 06/27/17 1525

## 2017-07-03 LAB — CUP PACEART REMOTE DEVICE CHECK
Battery Remaining Longevity: 42 mo
Brady Statistic RA Percent Paced: 10 %
Brady Statistic RV Percent Paced: 0 %
Date Time Interrogation Session: 20190102053100
HIGH POWER IMPEDANCE MEASURED VALUE: 52 Ohm
Implantable Lead Implant Date: 20091218
Implantable Lead Implant Date: 20091218
Implantable Lead Location: 753859
Lead Channel Impedance Value: 417 Ohm
Lead Channel Impedance Value: 625 Ohm
Lead Channel Pacing Threshold Amplitude: 0.4 V
Lead Channel Setting Pacing Pulse Width: 0.5 ms
MDC IDC LEAD LOCATION: 753860
MDC IDC LEAD SERIAL: 131093
MDC IDC MSMT BATTERY REMAINING PERCENTAGE: 46 %
MDC IDC MSMT LEADCHNL RA PACING THRESHOLD AMPLITUDE: 0.6 V
MDC IDC MSMT LEADCHNL RA PACING THRESHOLD PULSEWIDTH: 0.5 ms
MDC IDC MSMT LEADCHNL RV PACING THRESHOLD PULSEWIDTH: 0.5 ms
MDC IDC PG IMPLANT DT: 20091218
MDC IDC SET LEADCHNL RA PACING AMPLITUDE: 2 V
MDC IDC SET LEADCHNL RV PACING AMPLITUDE: 2.4 V
MDC IDC SET LEADCHNL RV SENSING SENSITIVITY: 0.6 mV
Pulse Gen Serial Number: 131093

## 2017-07-04 ENCOUNTER — Other Ambulatory Visit: Payer: Self-pay

## 2017-07-04 ENCOUNTER — Other Ambulatory Visit: Payer: Self-pay | Admitting: Family Medicine

## 2017-07-04 ENCOUNTER — Ambulatory Visit (INDEPENDENT_AMBULATORY_CARE_PROVIDER_SITE_OTHER): Payer: Medicare HMO | Admitting: Family Medicine

## 2017-07-04 ENCOUNTER — Encounter: Payer: Self-pay | Admitting: Family Medicine

## 2017-07-04 VITALS — BP 136/76 | HR 77 | Temp 97.8°F | Resp 16 | Ht 67.0 in | Wt 164.0 lb

## 2017-07-04 DIAGNOSIS — N184 Chronic kidney disease, stage 4 (severe): Secondary | ICD-10-CM | POA: Diagnosis not present

## 2017-07-04 DIAGNOSIS — Z23 Encounter for immunization: Secondary | ICD-10-CM

## 2017-07-04 DIAGNOSIS — E785 Hyperlipidemia, unspecified: Secondary | ICD-10-CM

## 2017-07-04 DIAGNOSIS — I10 Essential (primary) hypertension: Secondary | ICD-10-CM | POA: Diagnosis not present

## 2017-07-04 DIAGNOSIS — M10362 Gout due to renal impairment, left knee: Secondary | ICD-10-CM | POA: Diagnosis not present

## 2017-07-04 DIAGNOSIS — E1122 Type 2 diabetes mellitus with diabetic chronic kidney disease: Secondary | ICD-10-CM

## 2017-07-04 MED ORDER — ALLOPURINOL 100 MG PO TABS: 100.0000 mg | ORAL_TABLET | Freq: Every day | ORAL | 3 refills | Status: DC

## 2017-07-04 MED ORDER — PREDNISONE 10 MG PO TABS: ORAL_TABLET | ORAL | 0 refills | Status: DC

## 2017-07-04 NOTE — Assessment & Plan Note (Signed)
Chronic problem.  Following w/ Dr Joelyn Oms.  His Cr was elevated at recent ER visit.  Repeat BMP and fax to Dr Joelyn Oms as pt has not been seen recently (had to cancel his last appt)

## 2017-07-04 NOTE — Assessment & Plan Note (Signed)
Chronic problem.  Pt is currently diet controlled.  UTD on foot exam, eye exam.  On ACE for renal protection.  Check labs.  Adjust tx plan prn.

## 2017-07-04 NOTE — Telephone Encounter (Signed)
Prescriptions sent

## 2017-07-04 NOTE — Assessment & Plan Note (Signed)
Chronic problem.  Pt is not taking his Allopurinol.  I do not see where renal stopped it.  Will check Uric Acid level and start Prednisone.  Reviewed supportive care and red flags that should prompt return.  Pt expressed understanding and is in agreement w/ plan.

## 2017-07-04 NOTE — Assessment & Plan Note (Signed)
Chronic problem.  Adequate control.  Asymptomatic.  Check labs as pt's Cr was elevated at recent ER visit.

## 2017-07-04 NOTE — Progress Notes (Signed)
   Subjective:    Patient ID: Tyler Hahn, male    DOB: [birth year], 77 y.o.   MRN: [id number]  HPI Gout- chronic problem, not taking his allopurinol.  Was seen 1/11 in ER for severe gout flare.  Completed 5 days of Prednisone 40mg .  Pain improved w/ prednisone but did not resolve.  Pain is currently in L knee.  No longer having pain in R knee.  L knee remains swollen, not red but is warm to the touch.    HTN- chronic problem, on Coreg 12.5mg  BID, Lisinopril 20mg  daily w/ adequate control.  No CP.  + intermittent SOB.  No HAs, no edema.  DM- chronic problem, currently diet controlled.  On ACE for renal protection.  UTD on eye exam, foot exam.  Hyperlipidemia- chronic problem, on Lipitor 20mg  daily.  No abd pain, N/V.  Kidney disease- pt's recent Cr 3.45.  Pt cancelled his last Nephrology appt.   Review of Systems For ROS see HPI     Objective:   Physical Exam  Constitutional: He is oriented to person, place, and time. He appears well-developed and well-nourished. No distress.  HENT:  Head: Normocephalic and atraumatic.  Eyes: Conjunctivae and EOM are normal. Pupils are equal, round, and reactive to light.  Neck: Normal range of motion. Neck supple. No thyromegaly present.  Cardiovascular: Normal rate, regular rhythm, normal heart sounds and intact distal pulses.  No murmur heard. Pulmonary/Chest: Effort normal and breath sounds normal. No respiratory distress.  Abdominal: Soft. Bowel sounds are normal. He exhibits no distension.  Musculoskeletal: He exhibits edema (swelling over L medial knee) and tenderness (exquisite TTP over L knee).  Lymphadenopathy:    He has no cervical adenopathy.  Neurological: He is alert and oriented to person, place, and time. No cranial nerve deficit.  Skin: Skin is warm and dry.  Psychiatric: He has a normal mood and affect. His behavior is normal.  Vitals reviewed.         Assessment & Plan:

## 2017-07-04 NOTE — Assessment & Plan Note (Signed)
Chronic problem.  Tolerating statin w/o difficulty.  Pt has lost 12 lbs since last visit.  Check labs.  Adjust meds prn

## 2017-07-04 NOTE — Telephone Encounter (Signed)
Medication filled to pharmacy.

## 2017-07-04 NOTE — Patient Instructions (Signed)
Schedule your complete physical in 6 months and your Medicare Wellness Visit at the same time [facility] notify you of your lab results and make any changes if needed Start the Prednisone as directed- take w/ food Call with any questions or concerns Hang in there! Happy New Year!!!

## 2017-07-04 NOTE — Telephone Encounter (Signed)
Copied from [place] [id number]. Topic: Quick Communication - See Telephone Encounter >> Jul 04, 2017  4:52 PM Vernona Rieger wrote: CRM for notification. See Telephone encounter for:   07/04/17.  Pt's son wants to make sure these get called in before the office closes. He was just seen in the office predniSONE (DELTASONE) 20 MG tablet & allopurinol (ZYLOPRIM) 100 MG tablet  [place] [facility] ([place]), [place] [address]

## 2017-07-07 LAB — LIPID PANEL
CHOLESTEROL: 162 mg/dL (ref <200)
HDL: 36 mg/dL — AB (ref >40)
LDL CHOLESTEROL (CALC): 83 mg/dL
Non-HDL Cholesterol (Calc): 126 mg/dL (calc) (ref <130)
Total CHOL/HDL Ratio: 4.5 (calc) (ref <5.0)
Triglycerides: 361 mg/dL — ABNORMAL HIGH (ref <150)

## 2017-07-07 LAB — CBC WITH DIFFERENTIAL/PLATELET
BASOS PCT: 0.3 %
Basophils Absolute: 28 cells/uL (ref 0–200)
EOS ABS: 195 {cells}/uL (ref 15–500)
Eosinophils Relative: 2.1 %
HCT: 35.6 % — ABNORMAL LOW (ref 38.5–50.0)
HEMOGLOBIN: 11.2 g/dL — AB (ref 13.2–17.1)
LYMPHS ABS: 1181 {cells}/uL (ref 850–3900)
MCH: 26.5 pg — ABNORMAL LOW (ref 27.0–33.0)
MCHC: 31.5 g/dL — ABNORMAL LOW (ref 32.0–36.0)
MCV: 84.2 fL (ref 80.0–100.0)
MPV: 10 fL (ref 7.5–12.5)
Monocytes Relative: 5.9 %
NEUTROS ABS: 7347 {cells}/uL (ref 1500–7800)
Neutrophils Relative %: 79 %
Platelets: 272 10*3/uL (ref 140–400)
RBC: 4.23 10*6/uL (ref 4.20–5.80)
RDW: 13.2 % (ref 11.0–15.0)
Total Lymphocyte: 12.7 %
WBC: 9.3 10*3/uL (ref 3.8–10.8)
WBCMIX: 549 {cells}/uL (ref 200–950)

## 2017-07-07 LAB — BASIC METABOLIC PANEL
BUN/Creatinine Ratio: 29 (calc) — ABNORMAL HIGH (ref 6–22)
BUN: 107 mg/dL — ABNORMAL HIGH (ref 7–25)
CHLORIDE: 106 mmol/L (ref 98–110)
CO2: 17 mmol/L — ABNORMAL LOW (ref 20–32)
Calcium: 9.5 mg/dL (ref 8.6–10.3)
Creat: 3.63 mg/dL — ABNORMAL HIGH (ref 0.70–1.18)
Glucose, Bld: 183 mg/dL — ABNORMAL HIGH (ref 65–99)
POTASSIUM: 5.3 mmol/L (ref 3.5–5.3)
SODIUM: 135 mmol/L (ref 135–146)

## 2017-07-07 LAB — URIC ACID: Uric Acid, Serum: 11.4 mg/dL — ABNORMAL HIGH (ref 4.0–8.0)

## 2017-07-07 LAB — HEMOGLOBIN A1C
HEMOGLOBIN A1C: 6.8 %{Hb} — AB (ref <5.7)
Mean Plasma Glucose: 148 (calc)
eAG (mmol/L): 8.2 (calc)

## 2017-07-07 LAB — HEPATIC FUNCTION PANEL
AG Ratio: 1.1 (calc) (ref 1.0–2.5)
ALBUMIN MSPROF: 3.9 g/dL (ref 3.6–5.1)
ALT: 15 U/L (ref 9–46)
AST: 8 U/L — ABNORMAL LOW (ref 10–35)
Alkaline phosphatase (APISO): 54 U/L (ref 40–115)
BILIRUBIN DIRECT: 0.1 mg/dL (ref 0.0–0.2)
BILIRUBIN INDIRECT: 0.2 mg/dL (ref 0.2–1.2)
BILIRUBIN TOTAL: 0.3 mg/dL (ref 0.2–1.2)
GLOBULIN: 3.7 g/dL (ref 1.9–3.7)
Total Protein: 7.6 g/dL (ref 6.1–8.1)

## 2017-07-07 LAB — TSH: TSH: 2.43 m[IU]/L (ref 0.40–4.50)

## 2017-07-21 ENCOUNTER — Other Ambulatory Visit: Payer: Self-pay | Admitting: Family Medicine

## 2017-08-20 ENCOUNTER — Other Ambulatory Visit: Payer: Self-pay | Admitting: Family Medicine

## 2017-09-02 ENCOUNTER — Ambulatory Visit: Payer: Self-pay | Admitting: *Deleted

## 2017-09-02 NOTE — Telephone Encounter (Signed)
Called in c/o both ankles being swollen with the left more swollen than the right for about a week now.   He has a defibrillator.  Denies shortness of breath, chest discomfort.   He mentioned he has been on his feet a lot lately with his ill wife who is in a facility.     See triage notes below.   Denies pain or warmth in either leg.  I made him an appt with Dr. Birdie Riddle for 09/03/17 at 1:00.    I instructed him to go to the ED if he began having pain in either leg, increased warmth, throbbing, increased swelling, shortness of breath or chest discomfort of any kind.   He verbalized understanding and agreed to this plan. Reason for Disposition . [1] MODERATE leg swelling (e.g., swelling extends up to knees) AND [2] new onset or worsening  Answer Assessment - Initial Assessment Questions 1. LOCATION: "Which joint is swollen?"     My ankles and feet are swollen for about a week now.   My left is more swollen than the right. 2. ONSET: "When did the swelling start?"     It started about a week ago. 3. SIZE: "How large is the swelling?"     Left is more swollen.   I just can get my foot into my shoe.  It bulges out over the side. 4. PAIN: "Is there any pain?" If so, ask: "How bad is it?" (Scale 1-10; or mild, moderate, severe)     No pain. 5. CAUSE: "What do you think caused the swollen joint?"     It's not happened before maybe a little but nothing like this.   166 is my weight.   It was 255 to now.    6. OTHER SYMPTOMS: "Do you have any other symptoms?" (e.g., fever, chest pain, difficulty breathing, calf pain)     I have heart problems.   I have a defibrillator.  No shortness of breath or chest discomfort.   I take so many I don't really know what all I'm taking.   I'm taking them every day.   I've been on my feet a lot lately with my wife.  I also see a heart doctor.   7. PREGNANCY: "Is there any chance you are pregnant?" "When was your last menstrual period?"     N/A  Answer Assessment -  Initial Assessment Questions 1. ONSET: "When did the swelling start?" (e.g., minutes, hours, days)     A week ago  The left is worse than the right 2. LOCATION: "What part of the leg is swollen?"  "Are both legs swollen or just one leg?"     My ankles 3. SEVERITY: "How bad is the swelling?" (e.g., localized; mild, moderate, severe)  - Localized - small area of swelling localized to one leg  - MILD pedal edema - swelling limited to foot and ankle, pitting edema < 1/4 inch (6 mm) deep, rest and elevation eliminate most or all swelling  - MODERATE edema - swelling of lower leg to knee, pitting edema > 1/4 inch (6 mm) deep, rest and elevation only partially reduce swelling  - SEVERE edema - swelling extends above knee, facial or hand swelling present      I can barely get my foot into my shoe.   It bulges over. 4. REDNESS: "Does the swelling look red or infected?"     Denies redness, or pain in either leg. 5. PAIN: "Is the swelling painful to  touch?" If so, ask: "How painful is it?"   (Scale 1-10; mild, moderate or severe)     Denies pain 6. FEVER: "Do you have a fever?" If so, ask: "What is it, how was it measured, and when did it start?"      No 7. CAUSE: "What do you think is causing the leg swelling?"     I've been on my feet a lot lately with my wife who is ill.   8. MEDICAL HISTORY: "Do you have a history of heart failure, kidney disease, liver failure, or cancer?"     I have a defibrillator. 9. RECURRENT SYMPTOM: "Have you had leg swelling before?" If so, ask: "When was the last time?" "What happened that time?"     No  Maybe a little but nothing like this. 10. OTHER SYMPTOMS: "Do you have any other symptoms?" (e.g., chest pain, difficulty breathing)       Denies any other symptoms 11. PREGNANCY: "Is there any chance you are pregnant?" "When was your last menstrual period?"       N/A  Protocols used: LEG SWELLING AND EDEMA-A-AH, ANKLE SWELLING-A-AH

## 2017-09-03 ENCOUNTER — Ambulatory Visit (INDEPENDENT_AMBULATORY_CARE_PROVIDER_SITE_OTHER): Payer: Medicare HMO | Admitting: Family Medicine

## 2017-09-03 ENCOUNTER — Encounter: Payer: Self-pay | Admitting: Family Medicine

## 2017-09-03 ENCOUNTER — Other Ambulatory Visit: Payer: Self-pay

## 2017-09-03 VITALS — BP 135/82 | HR 80 | Temp 98.0°F | Resp 16 | Ht 67.0 in | Wt 170.2 lb

## 2017-09-03 DIAGNOSIS — R600 Localized edema: Secondary | ICD-10-CM

## 2017-09-03 DIAGNOSIS — I5022 Chronic systolic (congestive) heart failure: Secondary | ICD-10-CM

## 2017-09-03 LAB — CBC WITH DIFFERENTIAL/PLATELET
BASOS PCT: 0.7 % (ref 0.0–3.0)
Basophils Absolute: 0 10*3/uL (ref 0.0–0.1)
EOS ABS: 0.2 10*3/uL (ref 0.0–0.7)
Eosinophils Relative: 4.2 % (ref 0.0–5.0)
HEMATOCRIT: 28.2 % — AB (ref 39.0–52.0)
HEMOGLOBIN: 9.1 g/dL — AB (ref 13.0–17.0)
LYMPHS PCT: 18.6 % (ref 12.0–46.0)
Lymphs Abs: 1.1 10*3/uL (ref 0.7–4.0)
MCHC: 32.4 g/dL (ref 30.0–36.0)
MCV: 86.4 fl (ref 78.0–100.0)
Monocytes Absolute: 0.4 10*3/uL (ref 0.1–1.0)
Monocytes Relative: 7 % (ref 3.0–12.0)
Neutro Abs: 4 10*3/uL (ref 1.4–7.7)
Neutrophils Relative %: 69.5 % (ref 43.0–77.0)
Platelets: 129 10*3/uL — ABNORMAL LOW (ref 150.0–400.0)
RBC: 3.27 Mil/uL — ABNORMAL LOW (ref 4.22–5.81)
RDW: 16.6 % — AB (ref 11.5–15.5)
WBC: 5.7 10*3/uL (ref 4.0–10.5)

## 2017-09-03 LAB — BRAIN NATRIURETIC PEPTIDE: Pro B Natriuretic peptide (BNP): 84 pg/mL (ref 0.0–100.0)

## 2017-09-03 NOTE — Progress Notes (Signed)
   Subjective:    Patient ID: Tyler Hahn, male    DOB: October 10, 1940, 77 y.o.   MRN: [id number]  HPI Edema- pt reports bilateral ankle swelling, L>R.  Pt was unable to put shoes on yesterday.  sxs started ~1 week ago.  Pt is not clear as to what medications he is taking so he is not sure if he is taking the Torsemide.  Pt reports 'it's aggravating but not painful' when they swell.  No SOB.  No CP.   Review of Systems For ROS see HPI     Objective:   Physical Exam  Constitutional: He is oriented to person, place, and time. He appears well-developed and well-nourished. No distress.  HENT:  Head: Normocephalic and atraumatic.  Eyes: Conjunctivae and EOM are normal. Pupils are equal, round, and reactive to light.  Neck: Normal range of motion. Neck supple. No thyromegaly present.  Cardiovascular: Normal rate, regular rhythm, normal heart sounds and intact distal pulses.  No murmur heard. Pulmonary/Chest: Effort normal and breath sounds normal. No respiratory distress.  Abdominal: Soft. Bowel sounds are normal. He exhibits no distension.  Musculoskeletal: He exhibits edema (2+ edema of L lower leg to just below knee, 1+ edema of R lower leg to mid shin). He exhibits no tenderness (no TTP over lower legs bilaterally).  Lymphadenopathy:    He has no cervical adenopathy.  Neurological: He is alert and oriented to person, place, and time. No cranial nerve deficit.  Skin: Skin is warm and dry.  Psychiatric: He has a normal mood and affect. His behavior is normal.  Vitals reviewed.         Assessment & Plan:  Localized edema in setting of CHF and chronic renal insufficiency- pt comes in today w/ marked edema of lower legs bilaterally.  Denies CP or SOB.  At first, he was unable to tell me what medication he is taking but was able to find his bag of medication in the back of his car (unclear as to why this was there and whether he is taking the medications in it as directed).  No TTP over  lower legs- no palpable cords.  His swelling is likely multifactorial- CHF, renal insufficiency w/ last Cr of 3.6  Will get labs to assess Cr and determine whether his diuretics can be increased.  Will get BNP to determine if this is cardiac.  Will need to determine next steps once labs are available to review.  Will refer to [facility] to help w/ medication management.  Pt expressed understanding and is in agreement w/ plan.

## 2017-09-03 NOTE — Patient Instructions (Signed)
We'll notify you of your lab results and determine the next steps Continue your current medications for now- we may change these based on your labs Try and limit your salt intake to help with swelling Call with any questions or concerns Hang in there!!!

## 2017-09-04 ENCOUNTER — Other Ambulatory Visit: Payer: Self-pay

## 2017-09-04 LAB — BASIC METABOLIC PANEL
BUN: 47 mg/dL — AB (ref 6–23)
CHLORIDE: 109 meq/L (ref 96–112)
CO2: 21 mEq/L (ref 19–32)
CREATININE: 3.12 mg/dL — AB (ref 0.40–1.50)
Calcium: 9.5 mg/dL (ref 8.4–10.5)
GFR: 25.18 mL/min — AB (ref >60.00)
Glucose, Bld: 138 mg/dL — ABNORMAL HIGH (ref 70–99)
Potassium: 5.2 mEq/L — ABNORMAL HIGH (ref 3.5–5.1)
Sodium: 141 mEq/L (ref 135–145)

## 2017-09-04 LAB — HEPATIC FUNCTION PANEL
ALT: 8 U/L (ref 0–53)
AST: 13 U/L (ref 0–37)
Albumin: 4.3 g/dL (ref 3.5–5.2)
Alkaline Phosphatase: 74 U/L (ref 39–117)
Bilirubin, Direct: 0.1 mg/dL (ref 0.0–0.3)
Total Bilirubin: 0.2 mg/dL (ref 0.2–1.2)
Total Protein: 6.9 g/dL (ref 6.0–8.3)

## 2017-09-04 NOTE — Patient Outreach (Signed)
[place]) Care Management  09/04/2017  Tyler Hahn 09/24/1940 [id number]   Telephone Screen  Referral Date: 09/03/17 Referral Source: MD office(Dr. Birdie Riddle) Referral Reason: "medication management-pt is missing medications and unable to say what he is taking" Insurance:   Outreach attempt # 1 to patient. Spoke with patient and screening completed.    Social: Patient resides in his home alone. He voices that his spouse is currently a resident at [facility]. He reports spouse has been I multiple different facilities within the past few years. He goes to visit spouse daily. Patient is independent with ADLs/IADLs. He drives himself to appts although he voices that his children do not like that he is still driving. He voices he fell last month due to a gout flare up and his legs hurting. DME in the home include walker, cane, BSC and shower chair. He stets that he keeps his cane in the car in case he needs it and only uses walker if he needs it. Patient voices that he has children who are able to assist as needed. He has two sons who are local and two dtrs who are not. He voices that all his children will be visiting next week to get his home better organized and arranged for him. He states he moved into his current place to be closer to his spouse.   Conditions: Per chart review, patient ha PMH of CHF, CKD stage 4, gout, HLD, DM(diet controlled) and HTN. Patient complains of ongoing issues with swelling to lower extremities. He admits that he "grabs food on the go" and does not always eat healthy. He has scale in the home. He voices he is trying to weigh every other day. RN CM reviewed s/s of worsening condition with patient and when to seek medical attention.Patient saw PCP on yesterday regarding the issue and lab work done. He is awaiting lab work to determine if he will need to start diuretics. Patient voices that he also has a defibrillator. He does not seem very  knowledgeable regarding all of his chronic conditions and needs further support and education. He has been so focused on his spouse and her care that he has neglected his own medical issues.   Medications: Per patient report he is taking about six meds. He voices that he keeps his meds "in a bag." Per MD notes PCP concerned about patient being able to safely manage meds on his own. Patient states he has a med planner but does not use it often. He voiced he takes his meds "from the pill bottles in the bag."  He voices that paying for his meds does get tough at times due to the financial strain of his spouse being in a SNF.   Appointments:Patient saw PCP on yesterday. He is followed by Dr. Annie Sable) and Dr. Nadean Corwin). He has an appt to have his heart device checked on 09/17/17.  Advance Directives: Patient states he completed one a while back when he developed all his heart problems. He is unsure if he gave medical team a copy. He states that he needs to review it and wants to make some changes after talking with his children. Patient agreeable to [facility] staff giving him one when they visit him.    Consent: [facility] services reviewed and discussed. Patient gave verbal consent for services.    Plan: RN CM will notify [facility] administrative assistant of case status. RN CM will send [place] referral for med mgmt and assistance. RN CM  will send [place] RN for further in home eval/assessment of care needs, education/support and mgmt of chronic conditions.   Enzo Montgomery, RN,BSN,CCM [place] Management Telephonic Care Management Coordinator Direct Phone: [phone] Toll Free: [phone] Fax: [phone]

## 2017-09-05 ENCOUNTER — Other Ambulatory Visit: Payer: Self-pay | Admitting: *Deleted

## 2017-09-05 NOTE — Patient Outreach (Signed)
[facility]) Care Management  09/05/2017  Tyler Hahn 15-Jan-1941 [id number]   Referral received from telephonic care manager requesting assistance managing member's medications and chronic health conditions.  Per chart, he has history of hypertension, heart failure, diabetes, hyperthyroidism, chronic kidney disease, and hyperlipidemia.  Call placed to member to introduce self as assigned care and attempt to schedule home visits, unsuccessful.  Listed home number continues to ring, listed cell number states voice mail has not been set up.  Will follow up with second attempt next week.  Tyler Hahn, [place], MSN [place] [phone]

## 2017-09-08 ENCOUNTER — Other Ambulatory Visit: Payer: Self-pay | Admitting: *Deleted

## 2017-09-08 ENCOUNTER — Encounter: Payer: Self-pay | Admitting: General Practice

## 2017-09-08 NOTE — Patient Outreach (Signed)
[facility]) Care Management  09/08/2017  Tyler Hahn [birth year] [id number]   Second attempt made to contact member for engagement.  Identity verified.  This care manager introduced self as assigned care manager, [facility] care management services explained.  He agrees to home visit within the next 2 weeks, denies any urgent concerns at this time.  Will perform initial home assessment for needs and develop individualized care plan at that time.  [name], [place], MSN [place] [phone]

## 2017-09-12 ENCOUNTER — Other Ambulatory Visit: Payer: Self-pay | Admitting: Pharmacist

## 2017-09-12 ENCOUNTER — Other Ambulatory Visit: Payer: Self-pay | Admitting: Family Medicine

## 2017-09-12 MED ORDER — ATORVASTATIN CALCIUM 20 MG PO TABS: 20.0000 mg | ORAL_TABLET | Freq: Every day | ORAL | 0 refills | Status: DC

## 2017-09-12 NOTE — Telephone Encounter (Signed)
Copied from [place] [id number]. Topic: Quick Communication - See Telephone Encounter >> Sep 12, 2017  2:40 PM Conception Chancy, NT wrote: CRM for notification. See Telephone encounter for: 09/12/17.  Almyra Free is calling from [facility] and states she is a pharmacist and that the patient is needing a refill on calcitRIOL (ROCALTROL) 0.5 MCG capsule  And atorvastatin (LIPITOR) 20 MG tablet  Please advise.   [place] ([address]), [place] [address]  [address]  ([place]) [place] [address]  Phone: [phone] Fax: [phone]

## 2017-09-12 NOTE — Patient Outreach (Signed)
[place] [facility]) Care Management  [place]   09/12/2017  Tyler Hahn 01-20-1941 [id number]   77 y.o. year old male referred to [place] for Medication Adherence ([place]- Initial home visit ) Patient was referred by provider, Dr. Birdie Riddle  PMH s/f: CHF, CKD (stage 4), gout, HLD, type 2 diabetes, swelling  Patient with [facility] Medicare advantage plan.   Patient confirms identity with HIPAA-identifiers x2, Patient signed [facility] consent today during home visit.    SUBJECTIVE:   Medication Adherence: Patient keeps his medications together in a bag. Some pill bottles were found to be from November/December 2018 with 30 day supply. Patient states he thought Dr. Birdie Riddle told him she was going to "change his medications" so he wasn't sure which ones he was supposed to keep taking.   OBJECTIVE: Calcitriol, atorvastatin had no more refills.   Encounter Medications: Outpatient Encounter Medications as of 09/12/2017  Medication Sig Note  . allopurinol (ZYLOPRIM) 100 MG tablet Take 1 tablet (100 mg total) by mouth daily.   Marland Kitchen atorvastatin (LIPITOR) 20 MG tablet TAKE 1 TABLET BY MOUTH ONCE DAILY   . lisinopril (PRINIVIL,ZESTRIL) 20 MG tablet TAKE 1 TABLET BY MOUTH ONCE DAILY   . omeprazole (PRILOSEC) 40 MG capsule TAKE 1 CAPSULE BY MOUTH ONCE DAILY   . sertraline (ZOLOFT) 25 MG tablet TAKE 1 TABLET BY MOUTH ONCE DAILY   . torsemide (DEMADEX) 20 MG tablet TAKE 1 TABLET BY MOUTH ONCE DAILY   . calcitRIOL (ROCALTROL) 0.5 MCG capsule Take 0.5 mcg by mouth daily. 09/12/2017: Exp. Jan 2019  . carvedilol (COREG) 12.5 MG tablet TAKE ONE TABLET BY MOUTH TWICE DAILY (Patient not taking: Reported on 09/12/2017)   . [DISCONTINUED] atenolol (TENORMIN) 50 MG tablet Take 75 mg by mouth daily.     . [DISCONTINUED] Insulin Glargine (LANTUS SOLOSTAR [place]) Inject into the skin as directed.     . [DISCONTINUED] oxyCODONE-acetaminophen (PERCOCET/ROXICET) 5-325 MG tablet Take 1-2  tablets by mouth every 6 (six) hours as needed for severe pain.    No facility-administered encounter medications on file as of 09/12/2017.     Functional Status: In your present state of health, do you have any difficulty performing the following activities: 09/03/2017 07/04/2017  Hearing? N N  Comment - -  Vision? N N  Difficulty concentrating or making decisions? N N  Walking or climbing stairs? N N  Dressing or bathing? N N  Doing errands, shopping? N N  Some recent data might be hidden    Fall/Depression Screening: Fall Risk  09/04/2017 09/03/2017 09/25/2016  Falls in the past year? Yes No No  Number falls in past yr: 1 - -  Injury with Fall? No - -  Risk for fall due to : History of fall(s);Impaired balance/gait;Medication side effect - -  Follow up Falls evaluation completed - -   PHQ 2/9 Scores 09/04/2017 09/04/2017 09/03/2017 07/04/2017 09/25/2016 05/18/2015 02/14/2015  PHQ - 2 Score 1 0 0 0 0 0 4  PHQ- 9 Score - - 0 0 0 - 10   Assessment:  Drugs sorted by system:  Neurologic/Psychologic: Sertraline   Cardiovascular: Atorvastatin Carvedilol  Lisinopril  Torsemide  Gastrointestinal: Omeprazole  Vitamins/Minerals: Calcitriol   Miscellaneous: Allopurinol   Gaps in therapy: manages diabetes with diet Medications to avoid in the elderly: omeprazole  Plan: 1. Completed medication review with patient. Noted that patient was out of two medications (calcitriol, carvedilol) and others were filled for 30 days in November or December  2018. Counseled patient on why he was taking each medication and the importance of adherence.  Supplied patient with pill box and filled for the next 7 days. Noticed in doing so that patient would run out of medications.  2. To address cost concerns, assessed each medication on [employer]'s drug formulary tier. Placed a call to [place] to assess copay for tier 1 and tier 2 medications via mail order. Found tier 1 medications were $15  for 30 days supply, $45 for 90 days supply; tier 2 medications were $20 for 30 days supply, $60 for 90 days supply. This is more expensive than patient's preferred pharmacy, [facility] [phone] so [facility] use [facility].  3. Called patient's preferred pharmacy and refilled the following prescriptions: allopurinol, carvedilol, lisinopril, omeprazole, sertraline, torsemide. Atorvastatin prescription had no refills remaining.  4. Called Dr. Birdie Riddle for refill requests for calcitriol and atorvastatin if deemed therapeutically appropriate.  5. Wrote out a medication schedule for patient to follow. Patient stated he had no other pharmacy needs at this time, will close out of pharmacy case. Patient has contact information if any issue comes up in the future.   Charlett Lango, PharmD Clinical Pharmacist, [facility] [phone]

## 2017-09-12 NOTE — Telephone Encounter (Signed)
Request for refill of Calcitrol(Rocaltrol) 0.5mg  capsule. Medication previously filled by historical provider. Refill of Atorvastatin given.   LOV: 09/03/17  [facility]    [address]

## 2017-09-13 ENCOUNTER — Encounter: Payer: Self-pay | Admitting: *Deleted

## 2017-09-15 MED ORDER — ATORVASTATIN CALCIUM 20 MG PO TABS: 20.0000 mg | ORAL_TABLET | Freq: Every day | ORAL | 0 refills | Status: DC

## 2017-09-15 MED ORDER — CALCITRIOL 0.5 MCG PO CAPS: 0.5000 ug | ORAL_CAPSULE | Freq: Every day | ORAL | 0 refills | Status: DC

## 2017-09-15 NOTE — Telephone Encounter (Signed)
Ok to refill calcitrol?

## 2017-09-17 ENCOUNTER — Ambulatory Visit (INDEPENDENT_AMBULATORY_CARE_PROVIDER_SITE_OTHER): Payer: Medicare HMO | Admitting: *Deleted

## 2017-09-17 DIAGNOSIS — I428 Other cardiomyopathies: Secondary | ICD-10-CM | POA: Diagnosis not present

## 2017-09-17 NOTE — Progress Notes (Signed)
Remote ICD transmission.   

## 2017-09-18 ENCOUNTER — Encounter: Payer: Self-pay | Admitting: Cardiology

## 2017-09-24 ENCOUNTER — Other Ambulatory Visit: Payer: Self-pay | Admitting: *Deleted

## 2017-09-24 NOTE — Patient Outreach (Signed)
[place]) Care Management  09/24/2017  Tyler Hahn [birth year] [id number]   Call placed to member to confirm he will be available for home visit today, no answer.  Unable to leave a message.    Proceeded with plan for home visit, no answer.  Call placed again to member, no answer.  Unable to leave message.  Will follow up within the next 2 weeks.  Tyler Hahn, [place], MSN [place] [phone]

## 2017-10-06 ENCOUNTER — Other Ambulatory Visit: Payer: Self-pay | Admitting: *Deleted

## 2017-10-06 NOTE — Patient Outreach (Signed)
[place] [facility]) Care Management  10/06/2017  Tyler Hahn 09-17-1940 [id number]   Call placed to member to follow up on missed home visit, unsuccessful outreach. Unable to leave message as voice mail has not been set up.  Outreach letter sent, will await response.  Will make final outreach attempt within the next week.  If remain unsuccessful, will close case.  [name], [place], MSN [place] [phone]

## 2017-10-07 LAB — CUP PACEART REMOTE DEVICE CHECK
Battery Remaining Longevity: 36 mo
Brady Statistic RV Percent Paced: 0 %
Date Time Interrogation Session: 20190403052400
HIGH POWER IMPEDANCE MEASURED VALUE: 49 Ohm
Implantable Lead Implant Date: 20091218
Implantable Lead Location: 753859
Lead Channel Impedance Value: 555 Ohm
Lead Channel Pacing Threshold Amplitude: 0.4 V
Lead Channel Pacing Threshold Amplitude: 0.6 V
Lead Channel Pacing Threshold Pulse Width: 0.5 ms
Lead Channel Pacing Threshold Pulse Width: 0.5 ms
Lead Channel Setting Sensing Sensitivity: 0.6 mV
MDC IDC LEAD IMPLANT DT: 20091218
MDC IDC LEAD LOCATION: 753860
MDC IDC LEAD SERIAL: 131093
MDC IDC MSMT BATTERY REMAINING PERCENTAGE: 44 %
MDC IDC MSMT LEADCHNL RA IMPEDANCE VALUE: 382 Ohm
MDC IDC PG IMPLANT DT: 20091218
MDC IDC SET LEADCHNL RA PACING AMPLITUDE: 2 V
MDC IDC SET LEADCHNL RV PACING AMPLITUDE: 2.4 V
MDC IDC SET LEADCHNL RV PACING PULSEWIDTH: 0.5 ms
MDC IDC STAT BRADY RA PERCENT PACED: 7 %
Pulse Gen Serial Number: 131093

## 2017-10-13 ENCOUNTER — Other Ambulatory Visit: Payer: Self-pay | Admitting: *Deleted

## 2017-10-13 NOTE — Patient Outreach (Signed)
[place]) Care Management  10/13/2017  Tyler Hahn Sep 09, 1940 [id number]   3rd unsuccessful attempt to contact member for involvement.  Will close case at this time.  Will notify member and primary MD via letter.  [name], [place], MSN [place] [phone]

## 2017-10-27 ENCOUNTER — Encounter: Payer: Self-pay | Admitting: Physician Assistant

## 2017-11-05 DIAGNOSIS — E872 Acidosis: Secondary | ICD-10-CM | POA: Diagnosis not present

## 2017-11-05 DIAGNOSIS — I129 Hypertensive chronic kidney disease with stage 1 through stage 4 chronic kidney disease, or unspecified chronic kidney disease: Secondary | ICD-10-CM | POA: Diagnosis not present

## 2017-11-05 DIAGNOSIS — D631 Anemia in chronic kidney disease: Secondary | ICD-10-CM | POA: Diagnosis not present

## 2017-11-05 DIAGNOSIS — N2581 Secondary hyperparathyroidism of renal origin: Secondary | ICD-10-CM | POA: Diagnosis not present

## 2017-11-05 DIAGNOSIS — E875 Hyperkalemia: Secondary | ICD-10-CM | POA: Diagnosis not present

## 2017-11-05 DIAGNOSIS — N184 Chronic kidney disease, stage 4 (severe): Secondary | ICD-10-CM | POA: Diagnosis not present

## 2017-11-08 ENCOUNTER — Other Ambulatory Visit: Payer: Self-pay | Admitting: Family Medicine

## 2017-11-16 NOTE — Progress Notes (Signed)
Cardiology Office Note Date:  11/17/2017  Patient ID:  Tyler Hahn, Tyler Hahn 1941-05-24, MRN [id number] PCP:  [name], MD  Electrophysiologst:  Dr. Caryl Comes Nephrology: Dr. Joelyn Oms   Chief Complaint: annual visit  History of Present Illness: Tyler Hahn is a 77 y.o. male with history of NICM w/ICD, HTN, hyperthyroidism (follows with endo), HLD, CRI (IV).  He comes today to be seen for Dr. Caryl Comes, last seen by him in April 2018, at that visit, doing well, no changes were made to his tx.  He is doing well.  Doesn't sound very physically active, spends most of his time visiting with his wife at the SNF, where most of the time he is seated, occasionally will get some walks in, though reports takes care of his home and denies any exertional intolerances with his ADLs.  He denies any CP, palpitations, no rest SOB, no DOE, no symptoms of PND or orthopnea.  No dizziness, near syncope or syncope.  He sees his nephrologist about every 3 months, saw him last week.  He has trace edema, they discussed it, made no changes other then the addition of a vitamin.  He sees his PMD about Q 3-4 mo as well.  Device information: BSCi dual chamber ICD, implanted  06/03/08   Past Medical History:  Diagnosis Date  . Anxiety   . Automatic implantable cardioverter-defibrillator in situ    greg taylor  . CHF (congestive heart failure) ([place])    2000  . Diabetes mellitus    no meds  . Fatty liver   . Gout    "bout 2-3 months ago"-meds helped.  . Hyperlipidemia   . Hypertension   . Hyperthyroidism   . Kidney cysts   . PONV (postoperative nausea and vomiting)   . Renal insufficiency     Past Surgical History:  Procedure Laterality Date  . CARDIAC DEFIBRILLATOR PLACEMENT    . COLONOSCOPY WITH PROPOFOL N/A 10/03/2015   Procedure: COLONOSCOPY WITH PROPOFOL;  Surgeon: Ladene Artist, MD;  Location: [facility];  Service: Endoscopy;  Laterality: N/A;  . DIAGNOSTIC LAPAROSCOPY  01/27/2014   Dr  Brantley Stage  . ENTEROSCOPY N/A 11/25/2013   Procedure: ENTEROSCOPY;  Surgeon: Ladene Artist, MD;  Location: [facility];  Service: Endoscopy;  Laterality: N/A;  . ICD,[place] [employer]cent[employer]    . LAPAROSCOPY N/A 01/27/2014   Procedure: LAPAROSCOPY DIAGNOSTIC;  Surgeon: Joyice Faster. Cornett, MD;  Location: [place];  Service: General;  Laterality: N/A;  . polyp removal throat     difficulty speaking    Current Outpatient Medications  Medication Sig Dispense Refill  . allopurinol (ZYLOPRIM) 100 MG tablet Take 1 tablet (100 mg total) by mouth daily. 30 tablet 3  . atorvastatin (LIPITOR) 20 MG tablet Take 1 tablet (20 mg total) by mouth daily. 90 tablet 0  . calcitRIOL (ROCALTROL) 0.5 MCG capsule Take 1 capsule (0.5 mcg total) by mouth daily. 90 capsule 0  . carvedilol (COREG) 12.5 MG tablet TAKE 1 TABLET BY MOUTH TWICE DAILY 180 tablet 0  . Cholecalciferol (VITAMIN D3) 5000 units CAPS Take 1 capsule by mouth daily.    Tyler Hahn lisinopril (PRINIVIL,ZESTRIL) 20 MG tablet TAKE 1 TABLET BY MOUTH ONCE DAILY 90 tablet 0  . omeprazole (PRILOSEC) 40 MG capsule TAKE 1 CAPSULE BY MOUTH ONCE DAILY 30 capsule 6  . sertraline (ZOLOFT) 25 MG tablet TAKE 1 TABLET BY MOUTH ONCE DAILY 30 tablet 6  . torsemide (DEMADEX) 20 MG tablet TAKE 1 TABLET BY MOUTH  ONCE DAILY 30 tablet 3   No current facility-administered medications for this visit.     Allergies:   Sulfonamide derivatives   Social History:  The patient  reports that he quit smoking about 18 years ago. His smoking use included cigarettes. He has never used smokeless tobacco. He reports that he does not drink alcohol or use drugs.   Family History:  The patient's family history includes Alzheimer's disease in his mother; Diabetes in his father; Heart disease in his father; Kidney disease in his father; Liver disease in his father; Stomach cancer in his mother.  ROS:  Please see the history of present illness.  All other systems are reviewed and otherwise negative.    PHYSICAL EXAM:  VS:  BP 130/68   Pulse 60   Ht 5' 7.75" (1.721 m)   Wt 170 lb 1.9 oz (77.2 kg)   SpO2 99%   BMI 26.06 kg/m  BMI: Body mass index is 26.06 kg/m. Well nourished, well developed, in no acute distress  HEENT: normocephalic, atraumatic  Neck: no JVD, carotid bruits or masses Cardiac:  RRR; no significant murmurs, no rubs, or gallops Lungs:  CTA b/l, no wheezing, rhonchi or rales  Abd: soft, nontender MS: no deformity, + age appropriate atrophy Ext: trace edema  Skin: warm and dry, no rash Neuro:  No gross deficits appreciated Psych: euthymic mood, full affect  ICD site is stable, no tethering or discomfort   EKG:  Done today and reviewed by myself is  AP/VS, appears similar to previous ICD interrogation done today and reviewed by myself: battery and lead measurements are good, NSVT only, presenting SR, <1% VP, 7% AP  09/17/12: TTE Study Conclusion - Left ventricle: The cavity size was mildly dilated. Wall thickness was normal. Systolic function was severely reduced. The estimated ejection fraction was in the range of 20% to 25%. Diffuse hypokinesis. Doppler parameters are consistent with abnormal left ventricular relaxation (grade 1 diastolic dysfunction). - Aortic valve: There was no stenosis. - Mitral valve: Mildly calcified annulus. Trivial regurgitation. - Left atrium: The atrium was mildly dilated. - Right ventricle: The cavity size was normal. Pacer wire or catheter noted in right ventricle. Systolic function was mildly reduced. - Pulmonary arteries: No complete TR doppler jet so unable to estimate PA systolic pressure. - Inferior vena cava: The vessel was normal in size; the respirophasic diameter changes were in the normal range (= 50%); findings are consistent with normal central venous pressure. Impressions: - Mildly dilated left ventricle with severe global hypokinesis, EF 20-25%. Normal RV size with  mildly decreased systolic function. No significant valvular abnormalities.  Recent Labs: 07/04/2017: TSH 2.43 09/03/2017: ALT 8; BUN 47; Creatinine, Ser 3.12; Hemoglobin 9.1; Platelets 129.0; Potassium 5.2; Pro B Natriuretic peptide (BNP) 84.0; Sodium 141  07/04/2017: Cholesterol 162; HDL 36; LDL Cholesterol (Calc) 83; Total CHOL/HDL Ratio 4.5; Triglycerides 361   CrCl cannot be calculated (Patient's most recent lab result is older than the maximum 21 days allowed.).   Wt Readings from Last 3 Encounters:  11/17/17 170 lb 1.9 oz (77.2 kg)  09/03/17 170 lb 4 oz (77.2 kg)  07/04/17 164 lb (74.4 kg)     Other studies reviewed: Additional studies/records reviewed today include: summarized above  ASSESSMENT AND PLAN:  1. ICD     Intact function, no changes made  2. NICM     Weight is unchanged from March, trace edema     No symptoms of fluid OL     On  BB/ACE, diuretic  He follows closely with his nephrologist  3. HTN     Looks OK, no changes today    Disposition: F/u with Q 39mo remotes, and 1 year in-clinic given he sees his PMD and nephrologist so regularly, sooner with [place] if needed.  Current medicines are reviewed at length with the patient today.  The patient did not have any concerns regarding medicines.  Venetia Night, PA-C 11/17/2017 2:45 PM     [address] [place] [address] [phone] (office)  [phone] (fax)

## 2017-11-17 ENCOUNTER — Encounter: Payer: Self-pay | Admitting: Physician Assistant

## 2017-11-17 ENCOUNTER — Ambulatory Visit (INDEPENDENT_AMBULATORY_CARE_PROVIDER_SITE_OTHER): Payer: Medicare HMO | Admitting: Physician Assistant

## 2017-11-17 VITALS — BP 130/68 | HR 60 | Ht 67.75 in | Wt 170.1 lb

## 2017-11-17 DIAGNOSIS — I428 Other cardiomyopathies: Secondary | ICD-10-CM | POA: Diagnosis not present

## 2017-11-17 DIAGNOSIS — I10 Essential (primary) hypertension: Secondary | ICD-10-CM | POA: Diagnosis not present

## 2017-11-17 DIAGNOSIS — Z95810 Presence of automatic (implantable) cardiac defibrillator: Secondary | ICD-10-CM | POA: Diagnosis not present

## 2017-11-17 DIAGNOSIS — I5022 Chronic systolic (congestive) heart failure: Secondary | ICD-10-CM | POA: Diagnosis not present

## 2017-11-17 NOTE — Patient Instructions (Addendum)
Medication Instructions:    Your physician recommends that you continue on your current medications as directed. Please refer to the Current Medication list given to you today.   If you need a refill on your cardiac medications before your next appointment, please call your pharmacy.  Labwork: NONE ORDERED  TODAY    Testing/Procedures: NONE ORDERED  TODAY    Follow-Up: Your physician wants you to follow-up in: [place] will receive a reminder letter in the mail two months in advance. If you don't receive a letter, please call our office to schedule the follow-up appointment.      Remote monitoring is used to monitor your Pacemaker of ICD from home. This monitoring reduces the number of office visits required to check your device to one time per year. It allows [place] to keep an eye on the functioning of your device to ensure it is working properly. You are scheduled for a device check from home on . 12-17-17.. You may send your transmission at any time that day. If you have a wireless device, the transmission will be sent automatically. After your physician reviews your transmission, you will receive a postcard with your next transmission date.     Any Other Special Instructions Will Be Listed Below (If Applicable).

## 2017-11-28 DIAGNOSIS — N184 Chronic kidney disease, stage 4 (severe): Secondary | ICD-10-CM | POA: Diagnosis not present

## 2017-12-17 ENCOUNTER — Ambulatory Visit (INDEPENDENT_AMBULATORY_CARE_PROVIDER_SITE_OTHER): Payer: Medicare HMO | Admitting: *Deleted

## 2017-12-17 DIAGNOSIS — I428 Other cardiomyopathies: Secondary | ICD-10-CM | POA: Diagnosis not present

## 2017-12-17 NOTE — Progress Notes (Signed)
Remote ICD transmission.   

## 2018-01-06 DIAGNOSIS — D631 Anemia in chronic kidney disease: Secondary | ICD-10-CM | POA: Diagnosis not present

## 2018-01-06 DIAGNOSIS — N184 Chronic kidney disease, stage 4 (severe): Secondary | ICD-10-CM | POA: Diagnosis not present

## 2018-01-06 DIAGNOSIS — N2581 Secondary hyperparathyroidism of renal origin: Secondary | ICD-10-CM | POA: Diagnosis not present

## 2018-01-06 DIAGNOSIS — I129 Hypertensive chronic kidney disease with stage 1 through stage 4 chronic kidney disease, or unspecified chronic kidney disease: Secondary | ICD-10-CM | POA: Diagnosis not present

## 2018-01-06 DIAGNOSIS — E872 Acidosis: Secondary | ICD-10-CM | POA: Diagnosis not present

## 2018-01-06 DIAGNOSIS — E875 Hyperkalemia: Secondary | ICD-10-CM | POA: Diagnosis not present

## 2018-01-14 LAB — CUP PACEART REMOTE DEVICE CHECK
Brady Statistic RA Percent Paced: 8 %
Brady Statistic RV Percent Paced: 0 %
HIGH POWER IMPEDANCE MEASURED VALUE: 51 Ohm
Implantable Lead Implant Date: 20091218
Implantable Lead Location: 753860
Implantable Lead Model: 158
Implantable Lead Serial Number: 131093
Lead Channel Pacing Threshold Amplitude: 0.4 V
Lead Channel Pacing Threshold Amplitude: 0.6 V
Lead Channel Pacing Threshold Pulse Width: 0.5 ms
Lead Channel Setting Pacing Amplitude: 2 V
Lead Channel Setting Pacing Amplitude: 2.4 V
Lead Channel Setting Pacing Pulse Width: 0.5 ms
Lead Channel Setting Sensing Sensitivity: 0.6 mV
MDC IDC LEAD IMPLANT DT: 20091218
MDC IDC LEAD LOCATION: 753859
MDC IDC MSMT BATTERY REMAINING LONGEVITY: 36 mo
MDC IDC MSMT BATTERY REMAINING PERCENTAGE: 41 %
MDC IDC MSMT LEADCHNL RA IMPEDANCE VALUE: 403 Ohm
MDC IDC MSMT LEADCHNL RV IMPEDANCE VALUE: 597 Ohm
MDC IDC MSMT LEADCHNL RV PACING THRESHOLD PULSEWIDTH: 0.5 ms
MDC IDC PG IMPLANT DT: 20091218
MDC IDC SESS DTM: 20190703043100
Pulse Gen Serial Number: 131093

## 2018-02-10 DIAGNOSIS — E875 Hyperkalemia: Secondary | ICD-10-CM | POA: Diagnosis not present

## 2018-02-28 ENCOUNTER — Other Ambulatory Visit: Payer: Self-pay | Admitting: Family Medicine

## 2018-03-18 ENCOUNTER — Ambulatory Visit (INDEPENDENT_AMBULATORY_CARE_PROVIDER_SITE_OTHER): Payer: Medicare HMO | Admitting: *Deleted

## 2018-03-18 DIAGNOSIS — I5022 Chronic systolic (congestive) heart failure: Secondary | ICD-10-CM

## 2018-03-18 DIAGNOSIS — I428 Other cardiomyopathies: Secondary | ICD-10-CM | POA: Diagnosis not present

## 2018-03-18 NOTE — Progress Notes (Signed)
Remote ICD transmission.   

## 2018-04-10 LAB — CUP PACEART REMOTE DEVICE CHECK
Implantable Lead Implant Date: 20091218
Implantable Lead Model: 158
Implantable Lead Serial Number: 131093
Implantable Pulse Generator Implant Date: 20091218
MDC IDC LEAD IMPLANT DT: 20091218
MDC IDC LEAD LOCATION: 753859
MDC IDC LEAD LOCATION: 753860
MDC IDC MSMT BATTERY REMAINING LONGEVITY: 30 mo
MDC IDC SESS DTM: 20191025124808
Pulse Gen Serial Number: 131093

## 2018-04-14 ENCOUNTER — Other Ambulatory Visit: Payer: Self-pay | Admitting: Family Medicine

## 2018-05-26 ENCOUNTER — Other Ambulatory Visit: Payer: Self-pay | Admitting: General Practice

## 2018-05-26 NOTE — Telephone Encounter (Signed)
Please advise,   Last OV 09/03/17 Carvedilol last filled 11/11/17 #180 with 0  No upcoming appts.

## 2018-05-27 ENCOUNTER — Encounter: Payer: Self-pay | Admitting: General Practice

## 2018-05-27 MED ORDER — CARVEDILOL 12.5 MG PO TABS: 12.5000 mg | ORAL_TABLET | Freq: Two times a day (BID) | ORAL | 0 refills | Status: DC

## 2018-05-27 NOTE — Telephone Encounter (Signed)
Called pt and LMOVM, letter also mailed.

## 2018-05-27 NOTE — Telephone Encounter (Signed)
We can give 1 month but he is VERY overdue for a diabetes follow up.  No refills w/o appt

## 2018-05-28 DIAGNOSIS — I129 Hypertensive chronic kidney disease with stage 1 through stage 4 chronic kidney disease, or unspecified chronic kidney disease: Secondary | ICD-10-CM | POA: Diagnosis not present

## 2018-05-28 DIAGNOSIS — D631 Anemia in chronic kidney disease: Secondary | ICD-10-CM | POA: Diagnosis not present

## 2018-05-28 DIAGNOSIS — N184 Chronic kidney disease, stage 4 (severe): Secondary | ICD-10-CM | POA: Diagnosis not present

## 2018-05-28 DIAGNOSIS — N2581 Secondary hyperparathyroidism of renal origin: Secondary | ICD-10-CM | POA: Diagnosis not present

## 2018-05-28 DIAGNOSIS — E872 Acidosis: Secondary | ICD-10-CM | POA: Diagnosis not present

## 2018-05-28 DIAGNOSIS — E875 Hyperkalemia: Secondary | ICD-10-CM | POA: Diagnosis not present

## 2018-06-18 ENCOUNTER — Other Ambulatory Visit: Payer: Self-pay

## 2018-06-18 ENCOUNTER — Ambulatory Visit (INDEPENDENT_AMBULATORY_CARE_PROVIDER_SITE_OTHER): Payer: Medicare HMO

## 2018-06-18 ENCOUNTER — Ambulatory Visit (INDEPENDENT_AMBULATORY_CARE_PROVIDER_SITE_OTHER): Payer: Medicare HMO | Admitting: Family Medicine

## 2018-06-18 ENCOUNTER — Encounter: Payer: Self-pay | Admitting: Family Medicine

## 2018-06-18 VITALS — BP 146/86 | HR 67 | Temp 97.9°F | Resp 16 | Ht 68.0 in | Wt 166.2 lb

## 2018-06-18 DIAGNOSIS — E785 Hyperlipidemia, unspecified: Secondary | ICD-10-CM | POA: Diagnosis not present

## 2018-06-18 DIAGNOSIS — I428 Other cardiomyopathies: Secondary | ICD-10-CM

## 2018-06-18 DIAGNOSIS — I10 Essential (primary) hypertension: Secondary | ICD-10-CM

## 2018-06-18 DIAGNOSIS — I5022 Chronic systolic (congestive) heart failure: Secondary | ICD-10-CM | POA: Diagnosis not present

## 2018-06-18 DIAGNOSIS — E1122 Type 2 diabetes mellitus with diabetic chronic kidney disease: Secondary | ICD-10-CM

## 2018-06-18 DIAGNOSIS — N184 Chronic kidney disease, stage 4 (severe): Secondary | ICD-10-CM | POA: Diagnosis not present

## 2018-06-18 LAB — CUP PACEART REMOTE DEVICE CHECK
Date Time Interrogation Session: 20200102201814
Implantable Lead Implant Date: 20091218
Implantable Lead Implant Date: 20091218
Implantable Lead Location: 753859
Implantable Lead Location: 753860
Implantable Lead Model: 158
Implantable Lead Serial Number: 131093
Implantable Pulse Generator Implant Date: 20091218
Pulse Gen Serial Number: 131093

## 2018-06-18 NOTE — Progress Notes (Signed)
   Subjective:    Patient ID: Tyler Hahn, male    DOB: Nov 13, 1940, 78 y.o.   MRN: [id number]  HPI  HTN- chronic problem, on Coreg 12.5mg  BID, Lisinopril 20mg  daily, Torsemide 20mg  daily.  No CP, SOB, HAs, edema.  Hyperlipidemia- chronic problem, on Lipitor 20mg  daily.  No abd pain, N/V.  DM- chronic problem.  Attempting to control w/o medication.  Due for foot exam, eye exam.  On ACE for renal protection but due to renal disease, following w/ [facility].  Not eating regularly due to stress- kids are on him about this.  No numbness/tingling of feet.  No sores on feet.  UTD on flu shot, pneumonia shots, colonoscopy.  Review of Systems For ROS see HPI     Objective:   Physical Exam Vitals signs reviewed.  Constitutional:      General: He is not in acute distress.    Appearance: He is well-developed.  HENT:     Head: Normocephalic and atraumatic.  Eyes:     Conjunctiva/sclera: Conjunctivae normal.     Pupils: Pupils are equal, round, and reactive to light.  Neck:     Musculoskeletal: Normal range of motion and neck supple.     Thyroid: No thyromegaly.  Cardiovascular:     Rate and Rhythm: Normal rate and regular rhythm.     Heart sounds: Normal heart sounds. No murmur.  Pulmonary:     Effort: Pulmonary effort is normal. No respiratory distress.     Breath sounds: Normal breath sounds.  Abdominal:     General: Bowel sounds are normal. There is no distension.     Palpations: Abdomen is soft.  Lymphadenopathy:     Cervical: No cervical adenopathy.  Skin:    General: Skin is warm and dry.  Neurological:     Mental Status: He is alert and oriented to person, place, and time.     Cranial Nerves: No cranial nerve deficit.  Psychiatric:        Behavior: Behavior normal.           Assessment & Plan:

## 2018-06-18 NOTE — Assessment & Plan Note (Signed)
Chronic problem.  Pt is currently off all medication and not eating regularly.  Stressed need for him to eat as he shows evidence of muscle wasting.  Foot exam done today.  On ARB.  Due for eye exam.  Pt to schedule.  Check labs.  Start meds if needed but willing to let A1C ride a little high given his erratic eating habits.

## 2018-06-18 NOTE — Assessment & Plan Note (Signed)
Chronic problem.  Tolerating statin w/o difficulty.  Check labs.  Adjust meds prn  

## 2018-06-18 NOTE — Assessment & Plan Note (Signed)
Chronic problem.  BP is elevated today but he has been running around today.  Asymptomatic.  Check labs.  No anticipated med  Changes.

## 2018-06-18 NOTE — Assessment & Plan Note (Signed)
Chronic problem.  Following w/ Dr Joelyn Oms regularly.

## 2018-06-18 NOTE — Progress Notes (Signed)
Remote ICD transmission.   

## 2018-06-18 NOTE — Assessment & Plan Note (Signed)
Chronic problem.  On Coreg, Lisinopril, Torsemide.  Currently asymptomatic.  Following w/ Cards.

## 2018-06-18 NOTE — Patient Instructions (Signed)
Follow up in 3-4 months to recheck diabetes We'll notify you of your lab results and make any changes if needed Make sure you are eating regularly!!!  You have to take care of you!! SCHEDULE your eye exam!! Call with any questions or concerns Happy New Year!!!

## 2018-06-19 LAB — HEPATIC FUNCTION PANEL
ALK PHOS: 66 U/L (ref 39–117)
ALT: 10 U/L (ref 0–53)
AST: 12 U/L (ref 0–37)
Albumin: 4.6 g/dL (ref 3.5–5.2)
BILIRUBIN DIRECT: 0.1 mg/dL (ref 0.0–0.3)
Total Bilirubin: 0.4 mg/dL (ref 0.2–1.2)
Total Protein: 8.1 g/dL (ref 6.0–8.3)

## 2018-06-19 LAB — CBC WITH DIFFERENTIAL/PLATELET
Basophils Absolute: 0.1 10*3/uL (ref 0.0–0.1)
Basophils Relative: 1 % (ref 0.0–3.0)
EOS PCT: 3.2 % (ref 0.0–5.0)
Eosinophils Absolute: 0.2 10*3/uL (ref 0.0–0.7)
HCT: 37.3 % — ABNORMAL LOW (ref 39.0–52.0)
Hemoglobin: 12 g/dL — ABNORMAL LOW (ref 13.0–17.0)
Lymphocytes Relative: 16.7 % (ref 12.0–46.0)
Lymphs Abs: 0.8 10*3/uL (ref 0.7–4.0)
MCHC: 32.1 g/dL (ref 30.0–36.0)
MCV: 85.3 fl (ref 78.0–100.0)
MONOS PCT: 7.3 % (ref 3.0–12.0)
Monocytes Absolute: 0.4 10*3/uL (ref 0.1–1.0)
NEUTROS ABS: 3.6 10*3/uL (ref 1.4–7.7)
Neutrophils Relative %: 71.8 % (ref 43.0–77.0)
PLATELETS: 181 10*3/uL (ref 150.0–400.0)
RBC: 4.38 Mil/uL (ref 4.22–5.81)
RDW: 13.8 % (ref 11.5–15.5)
WBC: 5 10*3/uL (ref 4.0–10.5)

## 2018-06-19 LAB — LIPID PANEL
CHOL/HDL RATIO: 4
Cholesterol: 172 mg/dL (ref 0–200)
HDL: 42.9 mg/dL (ref >39.00)
NonHDL: 128.63
Triglycerides: 206 mg/dL — ABNORMAL HIGH (ref 0.0–149.0)
VLDL: 41.2 mg/dL — ABNORMAL HIGH (ref 0.0–40.0)

## 2018-06-19 LAB — BASIC METABOLIC PANEL
BUN: 46 mg/dL — ABNORMAL HIGH (ref 6–23)
CHLORIDE: 100 meq/L (ref 96–112)
CO2: 27 mEq/L (ref 19–32)
Calcium: 9.9 mg/dL (ref 8.4–10.5)
Creatinine, Ser: 3.26 mg/dL — ABNORMAL HIGH (ref 0.40–1.50)
GFR: 23.88 mL/min — AB (ref >60.00)
GLUCOSE: 109 mg/dL — AB (ref 70–99)
POTASSIUM: 4.1 meq/L (ref 3.5–5.1)
SODIUM: 140 meq/L (ref 135–145)

## 2018-06-19 LAB — LDL CHOLESTEROL, DIRECT: Direct LDL: 97 mg/dL

## 2018-06-19 LAB — HEMOGLOBIN A1C: HEMOGLOBIN A1C: 7 % — AB (ref 4.6–6.5)

## 2018-06-19 LAB — TSH: TSH: 2.86 u[IU]/mL (ref 0.35–4.50)

## 2018-06-24 ENCOUNTER — Other Ambulatory Visit: Payer: Self-pay | Admitting: Family Medicine

## 2018-07-14 ENCOUNTER — Telehealth: Payer: Self-pay

## 2018-07-14 DIAGNOSIS — M25559 Pain in unspecified hip: Secondary | ICD-10-CM

## 2018-07-14 NOTE — Telephone Encounter (Signed)
Want Tyler Hahn to follow up or were you going to send him somewhere? I did not see a mention in OV note

## 2018-07-14 NOTE — Telephone Encounter (Signed)
Copied from [place]. Topic: Appointment Scheduling - Scheduling Inquiry for Clinic >> Jul 14, 2018  2:28 PM Wynetta Emery, [place] C wrote: Reason for CRM:   Pt's son called to schedule ov for hip pain. Pt was seen by PCP and advise if not better to follow up with her. Pt is a 30 minute pt and has scheduled on PCP next available (Monday) pt's son would like to have pt seen sooner by PCP if possible. I did advise that I can send a message to ask  CB

## 2018-07-15 NOTE — Telephone Encounter (Signed)
Called and LMOVM for pt son to advise.

## 2018-07-15 NOTE — Telephone Encounter (Signed)
I would recommend Dr Tamala Julian at [facility]

## 2018-07-15 NOTE — Telephone Encounter (Signed)
Called and advised pt son of PCP recommendations they were agreeable. Referral placed. Please call pt son to schedule.

## 2018-07-15 NOTE — Telephone Encounter (Signed)
Please advise? Do you have a preference?

## 2018-07-15 NOTE — Addendum Note (Signed)
Addended by: Davis Gourd on: 07/15/2018 02:47 PM   Modules accepted: Orders

## 2018-07-15 NOTE — Telephone Encounter (Signed)
Caller name: [name]  Relation to pt: son  Call back number: [phone]   Reason for call:  Son received VM and wanted to know specifically where/who the sports med referral will be placed with (chart doesn't reflect)

## 2018-07-15 NOTE — Telephone Encounter (Signed)
If he continues to have hip pain, he will need a Sports Med referral.  We can save him the appt here

## 2018-07-20 ENCOUNTER — Ambulatory Visit: Payer: Medicare HMO | Admitting: Family Medicine

## 2018-07-20 DIAGNOSIS — Z0289 Encounter for other administrative examinations: Secondary | ICD-10-CM

## 2018-07-20 DIAGNOSIS — R69 Illness, unspecified: Secondary | ICD-10-CM | POA: Diagnosis not present

## 2018-08-01 ENCOUNTER — Other Ambulatory Visit: Payer: Self-pay | Admitting: Family Medicine

## 2018-08-03 NOTE — Progress Notes (Signed)
[facility] [place],  [address] Phone: [phone] Subjective:    I Tyler Hahn am serving as a Education administrator for Dr. Hulan Saas.   I'm seeing this patient by the request  of:    CC: Left hip pain  EUM:[id number]  Tyler Hahn is a 78 y.o. male coming in with complaint of left hip pain. [place] about a month ago.  Patient fell directly on the left side of his hip.  Severe pain immediately.  Pain is still in the lateral aspect of the hip.  Wakes him up at night.  Minimal radiation of the pain in the leg.  Symptoms will take some time to stand.  Denies any groin pain.  Denies any significant instability.  Just difficult to do anything more than regular daily activities.  Onset-after fall a month ago Location- lateral   Character- achy Aggravating factors- stairs  Severity-5 out of 10     Past Medical History:  Diagnosis Date  . Anxiety   . Automatic implantable cardioverter-defibrillator in situ    greg taylor  . CHF (congestive heart failure) ([place])    2000  . Diabetes mellitus    no meds  . Fatty liver   . Gout    "bout 2-3 months ago"-meds helped.  . Hyperlipidemia   . Hypertension   . Hyperthyroidism   . Kidney cysts   . PONV (postoperative nausea and vomiting)   . Renal insufficiency    Past Surgical History:  Procedure Laterality Date  . CARDIAC DEFIBRILLATOR PLACEMENT    . COLONOSCOPY WITH PROPOFOL N/A 10/03/2015   Procedure: COLONOSCOPY WITH PROPOFOL;  Surgeon: Ladene Artist, MD;  Location: [facility];  Service: Endoscopy;  Laterality: N/A;  . DIAGNOSTIC LAPAROSCOPY  01/27/2014   Dr Brantley Stage  . ENTEROSCOPY N/A 11/25/2013   Procedure: ENTEROSCOPY;  Surgeon: Ladene Artist, MD;  Location: [facility];  Service: Endoscopy;  Laterality: N/A;  . ICD,[place] [employer]cent[employer]    . LAPAROSCOPY N/A 01/27/2014   Procedure: LAPAROSCOPY DIAGNOSTIC;  Surgeon: Joyice Faster. Cornett, MD;  Location: [place];  Service: General;   Laterality: N/A;  . polyp removal throat     difficulty speaking   Social History   Socioeconomic History  . Marital status: Married    Spouse name: Not on file  . Number of children: 4  . Years of education: Not on file  . Highest education level: Not on file  Occupational History  . Occupation: Retired    Fish farm manager: RETIRED  Social Needs  . Financial resource strain: Not on file  . Food insecurity:    Worry: Not on file    Inability: Not on file  . Transportation needs:    Medical: Not on file    Non-medical: Not on file  Tobacco Use  . Smoking status: Former Smoker    Types: Cigarettes    Last attempt to quit: 11/26/1998    Years since quitting: 19.7  . Smokeless tobacco: Never Used  Substance and Sexual Activity  . Alcohol use: No    Alcohol/week: 0.0 standard drinks    Comment: quit  2008  . Drug use: No  . Sexual activity: Not on file  Lifestyle  . Physical activity:    Days per week: Not on file    Minutes per session: Not on file  . Stress: Not on file  Relationships  . Social connections:    Talks on phone: Not on file  Gets together: Not on file    Attends religious service: Not on file    Active member of club or organization: Not on file    Attends meetings of clubs or organizations: Not on file    Relationship status: Not on file  Other Topics Concern  . Not on file  Social History Narrative  . Not on file   Allergies  Allergen Reactions  . Sulfonamide Derivatives Itching and Rash   Family History  Problem Relation Age of Onset  . Stomach cancer Mother   . Alzheimer's disease Mother   . Diabetes Father   . Heart disease Father   . Liver disease Father   . Kidney disease Father     Current Outpatient Medications (Endocrine & Metabolic):  .  calcitRIOL (ROCALTROL) 0.5 MCG capsule, TAKE 1 CAPSULE BY MOUTH ONCE DAILY  Current Outpatient Medications (Cardiovascular):  .  atorvastatin (LIPITOR) 20 MG tablet, TAKE 1 TABLET BY MOUTH ONCE  DAILY .  carvedilol (COREG) 12.5 MG tablet, Take 1 tablet (12.5 mg total) by mouth 2 (two) times daily. Marland Kitchen  lisinopril (PRINIVIL,ZESTRIL) 20 MG tablet, TAKE 1 TABLET BY MOUTH ONCE DAILY .  torsemide (DEMADEX) 20 MG tablet, TAKE 1 TABLET BY MOUTH ONCE DAILY   Current Outpatient Medications (Analgesics):  .  allopurinol (ZYLOPRIM) 100 MG tablet, Take 1 tablet (100 mg total) by mouth daily.   Current Outpatient Medications (Other):  Marland Kitchen  Cholecalciferol (VITAMIN D3) 5000 units CAPS, Take 1 capsule by mouth daily. Marland Kitchen  omeprazole (PRILOSEC) 40 MG capsule, TAKE 1 CAPSULE BY MOUTH ONCE DAILY .  sertraline (ZOLOFT) 25 MG tablet, TAKE 1 TABLET BY MOUTH ONCE DAILY    Past medical history, social, surgical and family history all reviewed in electronic medical record.  No pertanent information unless stated regarding to the chief complaint.   Review of Systems:  No headache, visual changes, nausea, vomiting, diarrhea, constipation, dizziness, abdominal pain, skin rash, fevers, chills, night sweats, weight loss, swollen lymph nodes, body aches, joint swelling,  chest pain, shortness of breath, mood changes.  Positive muscle aches  Objective  Blood pressure 140/78, pulse 66, height 5\' 8"  (1.727 m), weight 166 lb (75.3 kg), SpO2 95 %.    General: No apparent distress alert and oriented x3 mood and affect normal, dressed appropriately.  HEENT: Pupils equal, extraocular movements intact  Respiratory: Patient's speak in full sentences and does not appear short of breath  Cardiovascular: No lower extremity edema, non tender, no erythema  Skin: Warm dry intact with no signs of infection or rash on extremities or on axial skeleton.  Abdomen: Soft nontender  Neuro: Cranial nerves II through XII are intact, neurovascularly intact in all extremities with 2+ DTRs and 2+ pulses.  Lymph: No lymphadenopathy of posterior or anterior cervical chain or axillae bilaterally.  Gait antalgic favoring the left hip MSK:   tender with full range of motion and good stability and symmetric strength and tone of shoulders, elbows, wrist, , knee and ankles bilaterally.  Arthritic changes of multiple joints  Left hip does have some bruising mild atrophy of the gluteal tendon.  Possible rupture there noted.  4-5 strength of the hip abductors.  Patient not does have full internal range of motion.  Negative straight leg test.  Severely tender to palpation of the greater trochanteric area.   Procedure:  left  greater trochanteric bursitis  Device: GE Logiq Q7   Verbal informed consent obtained.  Time-out conducted.  Noted no overlying erythema, induration,  or other signs of local infection.  Skin prepped in a sterile fashion.  Local anesthesia: Topical Ethyl chloride.  With sterile technique   Greater trochanteric area was visualized and patient's bursa was noted. A 22-gauge 3 inch needle was inserted and 4 cc of 0.5% Marcaine and 1 cc of Kenalog 40 mg/dL was injected.  Completed without difficulty  Pain immediately improved suggesting accurate placement of the medication.  Advised to call if fevers/chills, erythema, induration, drainage, or persistent bleeding.  Impression: Technically successful injection     Impression and Recommendations:     This case required medical decision making of moderate complexity. The above documentation has been reviewed and is accurate and complete Tyler Pulley, DO       Note: This dictation was prepared with Dragon dictation along with smaller phrase technology. Any transcriptional errors that result from this process are unintentional.

## 2018-08-04 ENCOUNTER — Ambulatory Visit: Payer: Medicare HMO | Admitting: Family Medicine

## 2018-08-04 ENCOUNTER — Encounter: Payer: Self-pay | Admitting: Family Medicine

## 2018-08-04 ENCOUNTER — Ambulatory Visit (INDEPENDENT_AMBULATORY_CARE_PROVIDER_SITE_OTHER)
Admission: RE | Admit: 2018-08-04 | Discharge: 2018-08-04 | Disposition: A | Discharge status: Home / Self Care | Payer: Medicare HMO | Source: Ambulatory Visit | Attending: Family Medicine | Admitting: Family Medicine

## 2018-08-04 VITALS — BP 140/78 | HR 66 | Ht 68.0 in | Wt 166.0 lb

## 2018-08-04 DIAGNOSIS — G8929 Other chronic pain: Secondary | ICD-10-CM

## 2018-08-04 DIAGNOSIS — M25552 Pain in left hip: Secondary | ICD-10-CM

## 2018-08-04 DIAGNOSIS — M7062 Trochanteric bursitis, left hip: Secondary | ICD-10-CM

## 2018-08-04 NOTE — Assessment & Plan Note (Signed)
Patient given injection today.  Concern for a rupture of the gluteal tendon as well.  We will monitor and see how patient does with conservative therapy.  We discussed with patient about icing regimen, home exercises, x-rays ordered today to to rule out any type of greater trochanteric fracture.  I think this is highly unlikely based on how patient is ambulating at the moment though.  I do not think that there is a chance for an intratrochanteric fracture either with patient's physical exam.  Patient will follow-up with me again in 4 weeks.

## 2018-08-04 NOTE — Patient Instructions (Signed)
Good to see you  pennsaid pinkie amount topically 2 times daily as needed.  Ice 20 minutes 2 times daily. Usually after activity and before bed. Exercises 3 times a week.  Xray downstairs See me again in 4 weeks to make sure you are all better

## 2018-08-17 ENCOUNTER — Encounter: Payer: Self-pay | Admitting: Physician Assistant

## 2018-08-17 ENCOUNTER — Other Ambulatory Visit: Payer: Self-pay

## 2018-08-17 ENCOUNTER — Ambulatory Visit (INDEPENDENT_AMBULATORY_CARE_PROVIDER_SITE_OTHER): Payer: Medicare HMO | Admitting: Physician Assistant

## 2018-08-17 ENCOUNTER — Ambulatory Visit (HOSPITAL_BASED_OUTPATIENT_CLINIC_OR_DEPARTMENT_OTHER)
Admission: RE | Admit: 2018-08-17 | Discharge: 2018-08-17 | Disposition: A | Discharge status: Home / Self Care | Payer: Medicare HMO | Source: Ambulatory Visit | Attending: Physician Assistant | Admitting: Physician Assistant

## 2018-08-17 VITALS — BP 140/80 | HR 62 | Temp 98.3°F | Resp 14 | Ht 68.0 in | Wt 165.0 lb

## 2018-08-17 DIAGNOSIS — I5022 Chronic systolic (congestive) heart failure: Secondary | ICD-10-CM

## 2018-08-17 DIAGNOSIS — J069 Acute upper respiratory infection, unspecified: Secondary | ICD-10-CM

## 2018-08-17 DIAGNOSIS — R413 Other amnesia: Secondary | ICD-10-CM | POA: Diagnosis not present

## 2018-08-17 DIAGNOSIS — R5381 Other malaise: Secondary | ICD-10-CM | POA: Diagnosis not present

## 2018-08-17 NOTE — Progress Notes (Signed)
Patient presents to clinic today with son c/o 3-4 days of URI symptoms -- cough and congestion, associated with loose stools and a couple of episodes of non-bloody emesis. Denies fever, chills, melena, hematochezia or tenesmus. Denies recent travel or sick contact. Has history of CHF and CKD (severe) on lisinopril and Demadex. This was not help during the past few days while vomiting/diarrhea were occurring. Patient and son note patient is feeling much better today. No loose stool or vomiting in > 24 hours. Cough has dissipated. No new symptoms. They are concerned about renal function. Patient denies any current leg swelling, PND or orthopnea.   Past Medical History:  Diagnosis Date  . Anxiety   . Automatic implantable cardioverter-defibrillator in situ    greg taylor  . CHF (congestive heart failure) ([place])    2000  . Diabetes mellitus    no meds  . Fatty liver   . Gout    "bout 2-3 months ago"-meds helped.  . Hyperlipidemia   . Hypertension   . Hyperthyroidism   . Kidney cysts   . PONV (postoperative nausea and vomiting)   . Renal insufficiency     Current Outpatient Medications on File Prior to Visit  Medication Sig Dispense Refill  . allopurinol (ZYLOPRIM) 100 MG tablet Take 1 tablet (100 mg total) by mouth daily. 30 tablet 3  . atorvastatin (LIPITOR) 20 MG tablet TAKE 1 TABLET BY MOUTH ONCE DAILY 90 tablet 0  . calcitRIOL (ROCALTROL) 0.5 MCG capsule TAKE 1 CAPSULE BY MOUTH ONCE DAILY 90 capsule 0  . carvedilol (COREG) 12.5 MG tablet Take 1 tablet (12.5 mg total) by mouth 2 (two) times daily. 60 tablet 0  . Cholecalciferol (VITAMIN D3) 5000 units CAPS Take 1 capsule by mouth daily.    Marland Kitchen lisinopril (PRINIVIL,ZESTRIL) 20 MG tablet TAKE 1 TABLET BY MOUTH ONCE DAILY 90 tablet 0  . omeprazole (PRILOSEC) 40 MG capsule TAKE 1 CAPSULE BY MOUTH ONCE DAILY 30 capsule 6  . sertraline (ZOLOFT) 25 MG tablet TAKE 1 TABLET BY MOUTH ONCE DAILY 30 tablet 6  . torsemide (DEMADEX) 20 MG tablet  TAKE 1 TABLET BY MOUTH ONCE DAILY 30 tablet 3  . [DISCONTINUED] atenolol (TENORMIN) 50 MG tablet Take 75 mg by mouth daily.      . [DISCONTINUED] Insulin Glargine (LANTUS SOLOSTAR [place]) Inject into the skin as directed.       No current facility-administered medications on file prior to visit.     Allergies  Allergen Reactions  . Sulfonamide Derivatives Itching and Rash    Family History  Problem Relation Age of Onset  . Stomach cancer Mother   . Alzheimer's disease Mother   . Diabetes Father   . Heart disease Father   . Liver disease Father   . Kidney disease Father     Social History   Socioeconomic History  . Marital status: Married    Spouse name: Not on file  . Number of children: 4  . Years of education: Not on file  . Highest education level: Not on file  Occupational History  . Occupation: Retired    Fish farm manager: RETIRED  Social Needs  . Financial resource strain: Not on file  . Food insecurity:    Worry: Not on file    Inability: Not on file  . Transportation needs:    Medical: Not on file    Non-medical: Not on file  Tobacco Use  . Smoking status: Former Smoker    Types: Cigarettes  Last attempt to quit: 11/26/1998    Years since quitting: 19.7  . Smokeless tobacco: Never Used  Substance and Sexual Activity  . Alcohol use: No    Alcohol/week: 0.0 standard drinks    Comment: quit  2008  . Drug use: No  . Sexual activity: Not on file  Lifestyle  . Physical activity:    Days per week: Not on file    Minutes per session: Not on file  . Stress: Not on file  Relationships  . Social connections:    Talks on phone: Not on file    Gets together: Not on file    Attends religious service: Not on file    Active member of club or organization: Not on file    Attends meetings of clubs or organizations: Not on file    Relationship status: Not on file  Other Topics Concern  . Not on file  Social History Narrative  . Not on file   Review of Systems - See  HPI.  All other ROS are negative.  BP 140/80   Pulse 62   Temp 98.3 F (36.8 C) (Oral)   Resp 14   Ht 5\' 8"  (1.727 m)   Wt 165 lb (74.8 kg)   SpO2 99%   BMI 25.09 kg/m   Physical Exam Vitals signs reviewed.  Constitutional:      Appearance: Normal appearance.  HENT:     Head: Normocephalic and atraumatic.     Right Ear: Tympanic membrane normal.     Left Ear: Tympanic membrane normal.     Nose: Nose normal.     Mouth/Throat:     Mouth: Mucous membranes are moist.  Eyes:     Conjunctiva/sclera: Conjunctivae normal.     Pupils: Pupils are equal, round, and reactive to light.  Neck:     Musculoskeletal: Neck supple.  Cardiovascular:     Rate and Rhythm: Normal rate and regular rhythm.     Pulses: Normal pulses.     Heart sounds: Normal heart sounds.  Pulmonary:     Effort: Pulmonary effort is normal.     Breath sounds: Rales (questionable mild rales in LLL) present. No wheezing.  Abdominal:     General: Bowel sounds are normal. There is no distension.     Palpations: Abdomen is soft.     Tenderness: There is no abdominal tenderness.  Neurological:     General: No focal deficit present.     Mental Status: He is alert. Mental status is at baseline.     Cranial Nerves: No cranial nerve deficit.     Motor: No weakness.     Coordination: Coordination normal.     Gait: Gait normal.     Deep Tendon Reflexes: Reflexes normal.  Psychiatric:        Mood and Affect: Mood normal.     Recent Results (from the past 2160 hour(s))  CUP PACEART REMOTE DEVICE CHECK     Status: None   Collection Time: 06/17/18  3:26 PM  Result Value Ref Range   Pulse Generator Manufacturer BOST    Date Time Interrogation Session [id number]    Pulse Gen Model E110 TELIGEN 100    Pulse Gen Serial Number [id number]    Clinic Name [facility] Pulse Generator Type Implantable Cardiac Defibulator    Implantable Pulse Generator Implant Date 23536144    Implantable Lead  Manufacturer GUIC    Implantable Lead Model 0158 [place] Reliance  Implantable Lead Serial Number [id number]    Implantable Lead Implant Date [id number]    Implantable Lead Location [phone]    Implantable Lead Manufacturer [facility]    Implantable Lead Model 1888TC Tendril ST Optim    Implantable Lead Serial Number [id number]    Implantable Lead Implant Date [id number]    Implantable Lead Location [id number]    Eval Rhythm NSR   Hemoglobin A1c     Status: Abnormal   Collection Time: 06/18/18  3:31 PM  Result Value Ref Range   Hgb A1c MFr Bld 7.0 (H) 4.6 - 6.5 %    Comment: Glycemic Control Guidelines for People with Diabetes:Non Diabetic:  <6%Goal of Therapy: <7%Additional Action Suggested:  >8%   Lipid panel     Status: Abnormal   Collection Time: 06/18/18  3:31 PM  Result Value Ref Range   Cholesterol 172 0 - 200 mg/dL    Comment: ATP III Classification       Desirable:  < 200 mg/dL               Borderline High:  200 - 239 mg/dL          High:  > = 240 mg/dL   Triglycerides 206.0 (H) 0.0 - 149.0 mg/dL    Comment: Normal:  <150 mg/dLBorderline High:  150 - 199 mg/dL   HDL 42.90 >39.00 mg/dL   VLDL 41.2 (H) 0.0 - 40.0 mg/dL   Total CHOL/HDL Ratio 4     Comment:                Men          Women1/2 Average Risk     3.4          3.3Average Risk          5.0          4.42X Average Risk          9.6          7.13X Average Risk          15.0          11.0                       NonHDL 128.63     Comment: NOTE:  Non-HDL goal should be 30 mg/dL higher than patient's LDL goal (i.e. LDL goal of < 70 mg/dL, would have non-HDL goal of < 100 mg/dL)  Basic metabolic panel     Status: Abnormal   Collection Time: 06/18/18  3:31 PM  Result Value Ref Range   Sodium 140 135 - 145 mEq/L   Potassium 4.1 3.5 - 5.1 mEq/L   Chloride 100 96 - 112 mEq/L   CO2 27 19 - 32 mEq/L   Glucose, Bld 109 (H) 70 - 99 mg/dL   BUN 46 (H) 6 - 23 mg/dL   Creatinine, Ser 3.26 (H) 0.40 - 1.50 mg/dL   Calcium 9.9 8.4 - 10.5 mg/dL   GFR  23.88 (L) >60.00 mL/min  TSH     Status: None   Collection Time: 06/18/18  3:31 PM  Result Value Ref Range   TSH 2.86 0.35 - 4.50 uIU/mL  Hepatic function panel     Status: None   Collection Time: 06/18/18  3:31 PM  Result Value Ref Range   Total Bilirubin 0.4 0.2 - 1.2 mg/dL   Bilirubin, Direct 0.1 0.0 - 0.3 mg/dL   Alkaline Phosphatase 66 39 - 117 U/L  AST 12 0 - 37 U/L   ALT 10 0 - 53 U/L   Total Protein 8.1 6.0 - 8.3 g/dL   Albumin 4.6 3.5 - 5.2 g/dL  CBC with Differential/Platelet     Status: Abnormal   Collection Time: 06/18/18  3:31 PM  Result Value Ref Range   WBC 5.0 4.0 - 10.5 K/uL   RBC 4.38 4.22 - 5.81 Mil/uL   Hemoglobin 12.0 (L) 13.0 - 17.0 g/dL   HCT 37.3 (L) 39.0 - 52.0 %   MCV 85.3 78.0 - 100.0 fl   MCHC 32.1 30.0 - 36.0 g/dL   RDW 13.8 11.5 - 15.5 %   Platelets 181.0 150.0 - 400.0 K/uL   Neutrophils Relative % 71.8 43.0 - 77.0 %   Lymphocytes Relative 16.7 12.0 - 46.0 %   Monocytes Relative 7.3 3.0 - 12.0 %   Eosinophils Relative 3.2 0.0 - 5.0 %   Basophils Relative 1.0 0.0 - 3.0 %   Neutro Abs 3.6 1.4 - 7.7 K/uL   Lymphs Abs 0.8 0.7 - 4.0 K/uL   Monocytes Absolute 0.4 0.1 - 1.0 K/uL   Eosinophils Absolute 0.2 0.0 - 0.7 K/uL   Basophils Absolute 0.1 0.0 - 0.1 K/uL  LDL cholesterol, direct     Status: None   Collection Time: 06/18/18  3:31 PM  Result Value Ref Range   Direct LDL 97.0 mg/dL    Comment: Optimal:  <100 mg/dLNear or Above Optimal:  100-129 mg/dLBorderline High:  130-159 mg/dLHigh:  160-189 mg/dLVery High:  >190 mg/dL    Assessment/Plan: 1. Upper respiratory tract infection, unspecified type Seems to be resolving per patent and son. Questionable rales in LL base on exam. Will check labs and CXR today. Will hold off on starting Rx until we have viewed this. Continue Mucinex. Keep hydrated.  - CBC w/Diff - DG Chest 2 View; Future - Basic metabolic panel  2. SYSTOLIC HEART FAILURE, CHRONIC Giving fatigue and recent symptoms, along with  questionable rales in LL base, will check CXR today. Reassess BMP.  - DG Chest 2 View; Future - Basic metabolic panel    Leeanne Rio, PA-C

## 2018-08-17 NOTE — Patient Instructions (Signed)
Please go to the lab today for blood work.  I will call you with your results. We will alter treatment regimen(s) if indicated by your results.   Please keep hydrated and try to rest. Continue your Mucinex. We can hold off on the Pedialyte for now.  Please go to the [facility] office for x-ray. We will call you with your results and alter treatment accordingly.   Sidney [address]  [place], [place] [address]  ER for any worsening symptoms.  Follow-up will be determined by results. I do want you to follow-up with PCP as scheduled for chronic memory changes.

## 2018-08-18 LAB — CBC WITH DIFFERENTIAL/PLATELET
BASOS ABS: 0 10*3/uL (ref 0.0–0.1)
Basophils Relative: 0.7 % (ref 0.0–3.0)
Eosinophils Absolute: 0.1 10*3/uL (ref 0.0–0.7)
Eosinophils Relative: 1.4 % (ref 0.0–5.0)
HCT: 33.1 % — ABNORMAL LOW (ref 39.0–52.0)
Hemoglobin: 10.9 g/dL — ABNORMAL LOW (ref 13.0–17.0)
Lymphocytes Relative: 16.5 % (ref 12.0–46.0)
Lymphs Abs: 0.8 10*3/uL (ref 0.7–4.0)
MCHC: 32.9 g/dL (ref 30.0–36.0)
MCV: 83.8 fl (ref 78.0–100.0)
Monocytes Absolute: 0.6 10*3/uL (ref 0.1–1.0)
Monocytes Relative: 11.7 % (ref 3.0–12.0)
NEUTROS PCT: 69.7 % (ref 43.0–77.0)
Neutro Abs: 3.3 10*3/uL (ref 1.4–7.7)
Platelets: 166 10*3/uL (ref 150.0–400.0)
RBC: 3.95 Mil/uL — AB (ref 4.22–5.81)
RDW: 14.2 % (ref 11.5–15.5)
WBC: 4.8 10*3/uL (ref 4.0–10.5)

## 2018-08-18 LAB — BASIC METABOLIC PANEL
BUN: 64 mg/dL — ABNORMAL HIGH (ref 6–23)
CO2: 21 meq/L (ref 19–32)
Calcium: 9.2 mg/dL (ref 8.4–10.5)
Chloride: 101 mEq/L (ref 96–112)
Creatinine, Ser: 3.64 mg/dL — ABNORMAL HIGH (ref 0.40–1.50)
GFR: 19.78 mL/min — ABNORMAL LOW (ref >60.00)
Glucose, Bld: 96 mg/dL (ref 70–99)
Potassium: 4.3 mEq/L (ref 3.5–5.1)
Sodium: 137 mEq/L (ref 135–145)

## 2018-08-21 ENCOUNTER — Ambulatory Visit (INDEPENDENT_AMBULATORY_CARE_PROVIDER_SITE_OTHER): Payer: Medicare HMO | Admitting: Family Medicine

## 2018-08-21 ENCOUNTER — Other Ambulatory Visit: Payer: Self-pay

## 2018-08-21 ENCOUNTER — Encounter: Payer: Self-pay | Admitting: Family Medicine

## 2018-08-21 VITALS — BP 126/82 | HR 75 | Temp 98.1°F | Resp 16 | Ht 68.0 in | Wt 164.4 lb

## 2018-08-21 DIAGNOSIS — F4321 Adjustment disorder with depressed mood: Secondary | ICD-10-CM

## 2018-08-21 DIAGNOSIS — R69 Illness, unspecified: Secondary | ICD-10-CM | POA: Diagnosis not present

## 2018-08-21 MED ORDER — SERTRALINE HCL 50 MG PO TABS: 50.0000 mg | ORAL_TABLET | Freq: Every day | ORAL | 3 refills | Status: DC

## 2018-08-21 NOTE — Patient Instructions (Signed)
Follow up in 3-4 weeks to recheck mood INCREASE your Sertraline to 50mg  daily- new prescription sent Make sure you are taking care of yourself! Eat and drink regularly! If you start to again feel sad, overwhelmed, or confused- let your family know and DO NOT DRIVE during that time Call with any questions or concerns Hang in there!!!

## 2018-08-21 NOTE — Progress Notes (Signed)
   Subjective:    Patient ID: Tyler Hahn, male    DOB: 07-14-1940, 78 y.o.   MRN: [id number]  HPI Memory issues- pt is accompanied by his son Tyler Hahn today.  Pt reports that at times he 'goes to a dark place' that he'd 'rather not talk about' and during those times, he seems to be more easily confused and forgetful.  This is concerning to family.  He was recently stopped by police in [place] when trying to make a L turn into oncoming traffic after leaving wife's nursing home for the night.  He recently saw Einar Pheasant (3/2) and scored very well on MMSE.  Son and pt admit there has been a lot of stress recently w/ wife's care and that pt has not been taking care of himself- not eating regularly, not drinking enough fluids.  Pt reports hx of depression dating back to childhood.   Review of Systems For ROS see HPI     Objective:   Physical Exam Vitals signs reviewed.  Constitutional:      General: He is not in acute distress.    Appearance: He is not ill-appearing or toxic-appearing.     Comments: Frail, elderly man  HENT:     Head: Normocephalic and atraumatic.  Neck:     Musculoskeletal: Normal range of motion and neck supple.  Cardiovascular:     Rate and Rhythm: Normal rate and regular rhythm.  Pulmonary:     Effort: Pulmonary effort is normal. No respiratory distress.     Breath sounds: Normal breath sounds. No wheezing.  Musculoskeletal:     Comments: Wasting of muscles between thumb and index finger  Lymphadenopathy:     Cervical: No cervical adenopathy.  Neurological:     General: No focal deficit present.     Mental Status: He is alert and oriented to person, place, and time.  Psychiatric:        Behavior: Behavior normal.        Thought Content: Thought content normal.     Comments: Flat, withdrawn           Assessment & Plan:

## 2018-08-24 DIAGNOSIS — N2581 Secondary hyperparathyroidism of renal origin: Secondary | ICD-10-CM | POA: Diagnosis not present

## 2018-08-24 DIAGNOSIS — N184 Chronic kidney disease, stage 4 (severe): Secondary | ICD-10-CM | POA: Diagnosis not present

## 2018-08-24 NOTE — Assessment & Plan Note (Signed)
Deteriorated.  I suspect that this is the cause of his memory loss.  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  Had lengthy discussion w/ pt and son regarding mood, self care, and safety.  Total time spent w/ pt and son, >30 minutes and more than 50% spent counseling.  Increase sertraline and monitor closely for improvement.

## 2018-08-26 DIAGNOSIS — E872 Acidosis: Secondary | ICD-10-CM | POA: Diagnosis not present

## 2018-08-26 DIAGNOSIS — N184 Chronic kidney disease, stage 4 (severe): Secondary | ICD-10-CM | POA: Diagnosis not present

## 2018-08-26 DIAGNOSIS — D631 Anemia in chronic kidney disease: Secondary | ICD-10-CM | POA: Diagnosis not present

## 2018-08-26 DIAGNOSIS — I129 Hypertensive chronic kidney disease with stage 1 through stage 4 chronic kidney disease, or unspecified chronic kidney disease: Secondary | ICD-10-CM | POA: Diagnosis not present

## 2018-08-26 DIAGNOSIS — N2581 Secondary hyperparathyroidism of renal origin: Secondary | ICD-10-CM | POA: Diagnosis not present

## 2018-08-26 DIAGNOSIS — E875 Hyperkalemia: Secondary | ICD-10-CM | POA: Diagnosis not present

## 2018-08-31 ENCOUNTER — Encounter: Payer: Self-pay | Admitting: *Deleted

## 2018-09-01 ENCOUNTER — Ambulatory Visit: Payer: Medicare HMO | Admitting: Family Medicine

## 2018-09-14 ENCOUNTER — Other Ambulatory Visit: Payer: Self-pay | Admitting: Family Medicine

## 2018-09-17 ENCOUNTER — Other Ambulatory Visit: Payer: Self-pay

## 2018-09-17 ENCOUNTER — Ambulatory Visit (INDEPENDENT_AMBULATORY_CARE_PROVIDER_SITE_OTHER): Payer: Medicare HMO | Admitting: *Deleted

## 2018-09-17 DIAGNOSIS — I428 Other cardiomyopathies: Secondary | ICD-10-CM | POA: Diagnosis not present

## 2018-09-17 LAB — CUP PACEART REMOTE DEVICE CHECK
Battery Remaining Longevity: 24 mo
Battery Remaining Percentage: 31 %
Brady Statistic RA Percent Paced: 10 %
Brady Statistic RV Percent Paced: 0 %
Date Time Interrogation Session: 20200402043100
HighPow Impedance: 52 Ohm
Implantable Lead Implant Date: 20091218
Implantable Lead Implant Date: 20091218
Implantable Lead Location: 753859
Implantable Lead Location: 753860
Implantable Lead Model: 158
Implantable Lead Serial Number: 131093
Implantable Pulse Generator Implant Date: 20091218
Lead Channel Impedance Value: 418 Ohm
Lead Channel Impedance Value: 544 Ohm
Lead Channel Pacing Threshold Amplitude: 0.4 V
Lead Channel Pacing Threshold Amplitude: 0.6 V
Lead Channel Pacing Threshold Pulse Width: 0.5 ms
Lead Channel Pacing Threshold Pulse Width: 0.5 ms
Lead Channel Setting Pacing Amplitude: 2 V
Lead Channel Setting Pacing Amplitude: 2.4 V
Lead Channel Setting Pacing Pulse Width: 0.5 ms
Lead Channel Setting Sensing Sensitivity: 0.6 mV
Pulse Gen Serial Number: 131093

## 2018-09-25 ENCOUNTER — Encounter: Payer: Self-pay | Admitting: Cardiology

## 2018-09-25 NOTE — Progress Notes (Signed)
Remote ICD transmission.   

## 2018-09-28 ENCOUNTER — Ambulatory Visit: Payer: Medicare HMO | Admitting: Family Medicine

## 2018-11-26 ENCOUNTER — Ambulatory Visit: Payer: Medicare HMO | Admitting: Physician Assistant

## 2018-11-26 NOTE — Progress Notes (Deleted)
Virtual Visit via Video   I connected with patient on 11/26/18 at 10:30 AM EDT by a video enabled telemedicine application and verified that I am speaking with the correct person using two identifiers.  Location patient: Home Location provider: Acup[facility]urist, Office Persons participating in the virtual visit: Patient, Provider, PA Student ([place]), CMA (Eduard Clos)  I discussed the limitations of evaluation and management by telemedicine and the availability of in person appointments. The patient expressed understanding and agreed to proceed.  Subjective:   HPI:   Patient presents today via Doxy.Me c/o ***.   ROS:   See pertinent positives and negatives per HPI.  Patient Active Problem List   Diagnosis Date Noted  . Greater trochanteric bursitis of left hip 08/04/2018  . Osteoarthritis 09/25/2016  . Screen for colon cancer   . Benign neoplasm of transverse colon   . Edema 07/28/2014  . Elevated LFTs 07/28/2014  . Adjustment disorder with depressed mood 03/24/2014  . Epigastric pain 01/27/2014  . Small bowel mass 12/20/2013  . Abdominal pain, left upper quadrant 11/25/2013  . Myalgia 07/07/2013  . Hyperthyroidism 05/06/2013  . Loss of weight 04/19/2013  . Polyarthralgia 04/19/2013  . Loss of appetite 04/19/2013  . 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 Right elbow pain 08/17/2012  . 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 SYSTOLIC HEART FAILURE, CHRONIC 06/07/2010  . Chronic kidney disease (CKD), stage IV (severe) ([place]) 04/04/2010  . TESTICULAR MASS 04/04/2010  . DM (diabetes mellitus) type II controlled with renal manifestation ([place]) 03/07/2010  . Hyperlipidemia 03/07/2010  . GOUT 03/07/2010  . HYPERTENSION, BENIGN ESSENTIAL 03/07/2010  . 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 Smoking status: Former Smoker    Types: Cigarettes    Quit date: 11/26/1998    Years since quitting: 20.0   . Smokeless tobacco: Never Used  Substance Use Topics  . Alcohol use: No    Alcohol/week: 0.0 standard drinks    Comment: quit  2008    Current Outpatient Medications:  .  allopurinol (ZYLOPRIM) 100 MG tablet, Take 1 tablet (100 mg total) by mouth daily., Disp: 30 tablet, Rfl: 3 .  atorvastatin (LIPITOR) 20 MG tablet, TAKE 1 TABLET BY MOUTH ONCE DAILY, Disp: 90 tablet, Rfl: 0 .  calcitRIOL (ROCALTROL) 0.5 MCG capsule, Take 1 capsule by mouth once daily, Disp: 90 capsule, Rfl: 0 .  carvedilol (COREG) 12.5 MG tablet, Take 1 tablet (12.5 mg total) by mouth 2 (two) times daily., Disp: 60 tablet, Rfl: 0 .  Cholecalciferol (VITAMIN D3) 5000 units CAPS, Take 1 capsule by mouth daily., Disp: , Rfl:  .  lisinopril (PRINIVIL,ZESTRIL) 20 MG tablet, TAKE 1 TABLET BY MOUTH ONCE DAILY, Disp: 90 tablet, Rfl: 0 .  omeprazole (PRILOSEC) 40 MG capsule, TAKE 1 CAPSULE BY MOUTH ONCE DAILY, Disp: 30 capsule, Rfl: 6 .  sertraline (ZOLOFT) 50 MG tablet, Take 1 tablet (50 mg total) by mouth daily., Disp: 30 tablet, Rfl: 3 .  torsemide (DEMADEX) 20 MG tablet, TAKE 1 TABLET BY MOUTH ONCE DAILY, Disp: 30 tablet, Rfl: 3  Allergies  Allergen Reactions  . Sulfonamide Derivatives Itching and Rash    Objective:   There were no vitals taken for this visit.  Patient is well-developed, well-nourished in no acute distress.  Resting comfortably *** at home.  Head is normocephalic, atraumatic.  No labored breathing.  Speech is clear and coherent with logical contest.  Patient is alert and oriented at  baseline.  ***  Assessment and Plan:        ***.   Leeanne Rio, PA-C 11/26/2018

## 2018-12-17 ENCOUNTER — Ambulatory Visit (INDEPENDENT_AMBULATORY_CARE_PROVIDER_SITE_OTHER): Payer: Medicare HMO | Admitting: *Deleted

## 2018-12-17 DIAGNOSIS — I428 Other cardiomyopathies: Secondary | ICD-10-CM

## 2018-12-17 LAB — CUP PACEART REMOTE DEVICE CHECK
Battery Remaining Longevity: 18 mo
Battery Remaining Percentage: 26 %
Brady Statistic RA Percent Paced: 8 %
Brady Statistic RV Percent Paced: 0 %
Date Time Interrogation Session: 20200702043100
HighPow Impedance: 47 Ohm
Implantable Lead Implant Date: 20091218
Implantable Lead Implant Date: 20091218
Implantable Lead Location: 753859
Implantable Lead Location: 753860
Implantable Lead Model: 158
Implantable Lead Serial Number: 131093
Implantable Pulse Generator Implant Date: 20091218
Lead Channel Impedance Value: 369 Ohm
Lead Channel Impedance Value: 480 Ohm
Lead Channel Setting Pacing Amplitude: 2 V
Lead Channel Setting Pacing Amplitude: 2.4 V
Lead Channel Setting Pacing Pulse Width: 0.5 ms
Lead Channel Setting Sensing Sensitivity: 0.6 mV
Pulse Gen Serial Number: 131093

## 2018-12-22 DIAGNOSIS — N184 Chronic kidney disease, stage 4 (severe): Secondary | ICD-10-CM | POA: Diagnosis not present

## 2018-12-25 ENCOUNTER — Ambulatory Visit (INDEPENDENT_AMBULATORY_CARE_PROVIDER_SITE_OTHER): Payer: Medicare HMO | Admitting: Physician Assistant

## 2018-12-25 ENCOUNTER — Encounter: Payer: Self-pay | Admitting: Physician Assistant

## 2018-12-25 ENCOUNTER — Encounter: Payer: Self-pay | Admitting: Cardiology

## 2018-12-25 ENCOUNTER — Other Ambulatory Visit: Payer: Self-pay

## 2018-12-25 DIAGNOSIS — M10331 Gout due to renal impairment, right wrist: Secondary | ICD-10-CM

## 2018-12-25 MED ORDER — PREDNISONE 10 MG PO TABS: ORAL_TABLET | ORAL | 0 refills | Status: AC

## 2018-12-25 NOTE — Progress Notes (Signed)
Virtual Visit via Video   I connected with patient on 12/25/18 at  9:20 AM EDT by a video enabled telemedicine application and verified that I am speaking with the correct person using two identifiers.  Location patient: Home Location provider: [facility], Office Persons participating in the virtual visit: Patient, Provider, [place] (Patina Moore)  I discussed the limitations of evaluation and management by telemedicine and the availability of in person appointments. The patient expressed understanding and agreed to proceed.  Subjective:   HPI:   Patient presents via Doxy.Me today with the help of his son complaining of redness, swelling and tenderness of L wrist over the past 2 days. Notes able to move wrist normally but with significant pain. Denies trauma or injury to the area. Denies fever, chills. Has history of chronic gout with flares. Is taking allopurinol daily as directed but notes increased consumption of trigger foods this week.  ROS:   See pertinent positives and negatives per HPI.  Patient Active Problem List   Diagnosis Date Noted  . Greater trochanteric bursitis of left hip 08/04/2018  . Osteoarthritis 09/25/2016  . Screen for colon cancer   . Benign neoplasm of transverse colon   . Edema 07/28/2014  . Elevated LFTs 07/28/2014  . Adjustment disorder with depressed mood 03/24/2014  . Epigastric pain 01/27/2014  . Small bowel mass 12/20/2013  . Abdominal pain, left upper quadrant 11/25/2013  . Myalgia 07/07/2013  . Hyperthyroidism 05/06/2013  . Loss of weight 04/19/2013  . Polyarthralgia 04/19/2013  . Loss of appetite 04/19/2013  . 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 Right elbow pain 08/17/2012  . 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 SYSTOLIC HEART FAILURE, CHRONIC 06/07/2010  . Chronic kidney disease (CKD), stage IV (severe) ([place]) 04/04/2010  . TESTICULAR MASS 04/04/2010  . DM (diabetes mellitus) type II controlled with renal manifestation  ([place]) 03/07/2010  . Hyperlipidemia 03/07/2010  . GOUT 03/07/2010  . HYPERTENSION, BENIGN ESSENTIAL 03/07/2010  . 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 Smoking status: Former Smoker    Types: Cigarettes    Quit date: 11/26/1998    Years since quitting: 20.0  . Smokeless tobacco: Never Used  Substance Use Topics  . Alcohol use: No    Alcohol/week: 0.0 standard drinks    Comment: quit  2008    Current Outpatient Medications:  .  allopurinol (ZYLOPRIM) 100 MG tablet, Take 1 tablet (100 mg total) by mouth daily., Disp: 30 tablet, Rfl: 3 .  atorvastatin (LIPITOR) 20 MG tablet, TAKE 1 TABLET BY MOUTH ONCE DAILY, Disp: 90 tablet, Rfl: 0 .  calcitRIOL (ROCALTROL) 0.5 MCG capsule, Take 1 capsule by mouth once daily, Disp: 90 capsule, Rfl: 0 .  carvedilol (COREG) 12.5 MG tablet, Take 1 tablet (12.5 mg total) by mouth 2 (two) times daily., Disp: 60 tablet, Rfl: 0 .  Cholecalciferol (VITAMIN D3) 5000 units CAPS, Take 1 capsule by mouth daily., Disp: , Rfl:  .  lisinopril (PRINIVIL,ZESTRIL) 20 MG tablet, TAKE 1 TABLET BY MOUTH ONCE DAILY, Disp: 90 tablet, Rfl: 0 .  omeprazole (PRILOSEC) 40 MG capsule, TAKE 1 CAPSULE BY MOUTH ONCE DAILY, Disp: 30 capsule, Rfl: 6 .  sertraline (ZOLOFT) 50 MG tablet, Take 1 tablet (50 mg total) by mouth daily., Disp: 30 tablet, Rfl: 3 .  torsemide (DEMADEX) 20 MG tablet, TAKE 1 TABLET BY MOUTH ONCE DAILY, Disp: 30 tablet, Rfl: 3  Allergies  Allergen Reactions  . Sulfonamide Derivatives  Itching and Rash    Objective:   There were no vitals taken for this visit.  Patient is well-developed, well-nourished in no acute distress.  Resting comfortably at home.  Head is normocephalic, atraumatic.  No labored breathing.  Speech is clear and coherent with logical contest.  Patient is alert and oriented at baseline.  L wrist swollen with noted erythema. ROM intact but reproduces pain. Note swelling  of distal fingers noted on examination.  Assessment and Plan:   1. Acute gout due to renal impairment involving right wrist Start steroid taper. Tylenol for breakthrough pain. Supportive measures reviewed. Avoid NSAIDs due to renal function. Strict ER precautions reviewed with patient and son. Handout sent to MyChart.  - predniSONE (DELTASONE) 10 MG tablet; Take 3 tablets (30 mg total) by mouth daily with breakfast for 3 days, THEN 2 tablets (20 mg total) daily with breakfast for 3 days, THEN 1 tablet (10 mg total) daily with breakfast for 3 days.  Dispense: 18 tablet; Refill: 0    Leeanne Rio, [place] 12/25/2018

## 2018-12-25 NOTE — Patient Instructions (Signed)
Instructions sent to MyChart.  Please take the steroid taper as directed. Tylenol if needed for breakthrough pain. Avoid any use of NSAIDs (Advil, Motrin, Aleve) due to kidney function. If symptoms are not improving or if anything worsens from this point forward, you will need to be seen in person. If there is any fever or inability to move the joint you need to be seen in ER ASAP.   Gout  Gout is painful swelling of your joints. Gout is a type of arthritis. It is caused by having too much uric acid in your body. Uric acid is a chemical that is made when your body breaks down substances called purines. If your body has too much uric acid, sharp crystals can form and build up in your joints. This causes pain and swelling. Gout attacks can happen quickly and be very painful (acute gout). Over time, the attacks can affect more joints and happen more often (chronic gout). What are the causes?  Too much uric acid in your blood. This can happen because: ? Your kidneys do not remove enough uric acid from your blood. ? Your body makes too much uric acid. ? You eat too many foods that are high in purines. These foods include organ meats, some seafood, and beer.  Trauma or stress. What increases the risk?  Having a family history of gout.  Being male and middle-aged.  Being male and having gone through menopause.  Being very overweight (obese).  Drinking alcohol, especially beer.  Not having enough water in the body (being dehydrated).  Losing weight too quickly.  Having an organ transplant.  Having lead poisoning.  Taking certain medicines.  Having kidney disease.  Having a skin condition called psoriasis. What are the signs or symptoms? An attack of acute gout usually happens in just one joint. The most common place is the big toe. Attacks often start at night. Other joints that may be affected include joints of the feet, ankle, knee, fingers, wrist, or elbow. Symptoms of an  attack may include:  Very bad pain.  Warmth.  Swelling.  Stiffness.  Shiny, red, or purple skin.  Tenderness. The affected joint may be very painful to touch.  Chills and fever. Chronic gout may cause symptoms more often. More joints may be involved. You may also have white or yellow lumps (tophi) on your hands or feet or in other areas near your joints. How is this treated?  Treatment for this condition has two phases: treating an acute attack and preventing future attacks.  Acute gout treatment may include: ? NSAIDs. ? Steroids. These are taken by mouth or injected into a joint. ? Colchicine. This medicine relieves pain and swelling. It can be given by mouth or through an IV tube.  Preventive treatment may include: ? Taking small doses of NSAIDs or colchicine daily. ? Using a medicine that reduces uric acid levels in your blood. ? Making changes to your diet. You may need to see a food expert (dietitian) about what to eat and drink to prevent gout. Follow these instructions at home: During a gout attack   If told, put ice on the painful area: ? Put ice in a plastic bag. ? Place a towel between your skin and the bag. ? Leave the ice on for 20 minutes, 2-3 times a day.  Raise (elevate) the painful joint above the level of your heart as often as you can.  Rest the joint as much as possible. If the joint is  in your leg, you may be given crutches.  Follow instructions from your doctor about what you cannot eat or drink. Avoiding future gout attacks  Eat a low-purine diet. Avoid foods and drinks such as: ? Liver. ? Kidney. ? Anchovies. ? Asparagus. ? Herring. ? Mushrooms. ? Mussels. ? Beer.  Stay at a healthy weight. If you want to lose weight, talk with your doctor. Do not lose weight too fast.  Start or continue an exercise plan as told by your doctor. Eating and drinking  Drink enough fluids to keep your pee (urine) pale yellow.  If you drink alcohol: ?  Limit how much you use to:  0-1 drink a day for women.  0-2 drinks a day for men. ? Be aware of how much alcohol is in your drink. In the [place] one drink equals one 12 oz bottle of beer (355 mL), one 5 oz glass of wine (148 mL), or one 1 oz glass of hard liquor (44 mL). General instructions  Take over-the-counter and prescription medicines only as told by your doctor.  Do not drive or use heavy machinery while taking prescription pain medicine.  Return to your normal activities as told by your doctor. Ask your doctor what activities are safe for you.  Keep all follow-up visits as told by your doctor. This is important. Contact a doctor if:  You have another gout attack.  You still have symptoms of a gout attack after 10 days of treatment.  You have problems (side effects) because of your medicines.  You have chills or a fever.  You have burning pain when you pee (urinate).  You have pain in your lower back or belly. Get help right away if:  You have very bad pain.  Your pain cannot be controlled.  You cannot pee. Summary  Gout is painful swelling of the joints.  The most common site of pain is the big toe, but it can affect other joints.  Medicines and avoiding some foods can help to prevent and treat gout attacks. This information is not intended to replace advice given to you by your health care provider. Make sure you discuss any questions you have with your health care provider. Document Released: 03/12/2008 Document Revised: 12/24/2017 Document Reviewed: 12/24/2017 Elsevier Patient Education  2020 [employer].

## 2018-12-25 NOTE — Progress Notes (Signed)
Remote ICD transmission.   

## 2018-12-25 NOTE — Progress Notes (Signed)
I have discussed the procedure for the virtual visit with the patient who has given consent to proceed with assessment and treatment.   Jennika Ringgold S [facility], CMA     

## 2019-01-04 ENCOUNTER — Other Ambulatory Visit: Payer: Self-pay | Admitting: Family Medicine

## 2019-01-04 NOTE — Telephone Encounter (Signed)
Patient's son, Tyler Hahn, called in stating that he was wondering why his father was repeatedly experience gout. After going over his fathers medication, he realized Tyler Hahn has not been taking his Allopurinol. Requesting refill, sending to PCP due to prescription running out over 1 year ago.   Please advise   Last refill:07/04/17 #30, 3  Last OV:  12/25/18

## 2019-01-04 NOTE — Telephone Encounter (Signed)
Ok to refill allopurinol?

## 2019-01-12 DIAGNOSIS — D631 Anemia in chronic kidney disease: Secondary | ICD-10-CM | POA: Diagnosis not present

## 2019-01-12 DIAGNOSIS — I129 Hypertensive chronic kidney disease with stage 1 through stage 4 chronic kidney disease, or unspecified chronic kidney disease: Secondary | ICD-10-CM | POA: Diagnosis not present

## 2019-01-12 DIAGNOSIS — N2581 Secondary hyperparathyroidism of renal origin: Secondary | ICD-10-CM | POA: Diagnosis not present

## 2019-01-12 DIAGNOSIS — N184 Chronic kidney disease, stage 4 (severe): Secondary | ICD-10-CM | POA: Diagnosis not present

## 2019-01-12 DIAGNOSIS — E872 Acidosis: Secondary | ICD-10-CM | POA: Diagnosis not present

## 2019-01-12 DIAGNOSIS — E875 Hyperkalemia: Secondary | ICD-10-CM | POA: Diagnosis not present

## 2019-01-20 ENCOUNTER — Other Ambulatory Visit: Payer: Self-pay

## 2019-01-20 DIAGNOSIS — Z20822 Contact with and (suspected) exposure to covid-19: Secondary | ICD-10-CM

## 2019-01-21 LAB — NOVEL CORONAVIRUS, NAA: SARS-CoV-2, NAA: NOT DETECTED

## 2019-02-01 ENCOUNTER — Other Ambulatory Visit: Payer: Self-pay | Admitting: Family Medicine

## 2019-02-04 ENCOUNTER — Other Ambulatory Visit: Payer: Self-pay | Admitting: Family Medicine

## 2019-02-10 ENCOUNTER — Encounter: Payer: Self-pay | Admitting: Family Medicine

## 2019-02-11 ENCOUNTER — Telehealth: Payer: Self-pay | Admitting: *Deleted

## 2019-02-11 NOTE — Telephone Encounter (Signed)
Patient's son dropped off physical forms for [facility].  Charge sheet attached and placed in provider's bin

## 2019-02-18 ENCOUNTER — Ambulatory Visit (INDEPENDENT_AMBULATORY_CARE_PROVIDER_SITE_OTHER): Payer: Medicare HMO | Admitting: Family Medicine

## 2019-02-18 ENCOUNTER — Encounter: Payer: Self-pay | Admitting: Family Medicine

## 2019-02-18 ENCOUNTER — Other Ambulatory Visit: Payer: Self-pay

## 2019-02-18 VITALS — BP 130/82 | HR 68 | Temp 97.4°F | Resp 16 | Ht 68.0 in | Wt 173.0 lb

## 2019-02-18 DIAGNOSIS — Z0000 Encounter for general adult medical examination without abnormal findings: Secondary | ICD-10-CM | POA: Insufficient documentation

## 2019-02-18 DIAGNOSIS — E1122 Type 2 diabetes mellitus with diabetic chronic kidney disease: Secondary | ICD-10-CM | POA: Diagnosis not present

## 2019-02-18 DIAGNOSIS — Z111 Encounter for screening for respiratory tuberculosis: Secondary | ICD-10-CM

## 2019-02-18 DIAGNOSIS — Z23 Encounter for immunization: Secondary | ICD-10-CM | POA: Diagnosis not present

## 2019-02-18 DIAGNOSIS — N184 Chronic kidney disease, stage 4 (severe): Secondary | ICD-10-CM | POA: Diagnosis not present

## 2019-02-18 NOTE — Progress Notes (Signed)
   Subjective:    Patient ID: Tyler Hahn, male    DOB: 1941-02-28, 78 y.o.   MRN: [id number]  HPI CPE- UTD on colonoscopy, foot exam, pneumonia vaccines.  Due for flu shot.  Due for eye exam.  Seeing Dr Joelyn Oms at [facility] Dr Caryl Comes.   Review of Systems Patient reports no vision/hearing changes, anorexia, fever ,adenopathy, persistant/recurrent hoarseness, swallowing issues, chest pain, palpitations, edema, persistant/recurrent cough, hemoptysis, dyspnea (rest,exertional, paroxysmal nocturnal), gastrointestinal  bleeding (melena, rectal bleeding), abdominal pain, excessive heart burn, GU symptoms (dysuria, hematuria, voiding/incontinence issues) syncope, focal weakness, memory loss, numbness & tingling, skin/hair/nail changes, depression, anxiety, abnormal bruising/bleeding, musculoskeletal symptoms/signs.     Objective:   Physical Exam General Appearance:    Alert, cooperative, no distress, appears stated age  Head:    Normocephalic, without obvious abnormality, atraumatic  Eyes:    PERRL, conjunctiva/corneas clear, EOM's intact, fundi    benign, both eyes       Ears:    Normal TM's and external ear canals, both ears  Nose:   Deferred due to COVID  Throat:   Neck:   Supple, symmetrical, trachea midline, no adenopathy;       thyroid:  No enlargement/tenderness/nodules  Back:     Symmetric, no curvature, ROM normal, no CVA tenderness  Lungs:     Clear to auscultation bilaterally, respirations unlabored  Chest wall:    No tenderness or deformity  Heart:    Regular rate and rhythm, S1 and S2 normal, no murmur, rub   or gallop  Abdomen:     Soft, non-tender, bowel sounds active all four quadrants,    no masses, no organomegaly  Genitalia:    Deferred  Rectal:    Extremities:   Extremities normal, atraumatic, no cyanosis or edema  Pulses:   2+ and symmetric all extremities  Skin:   Skin color, texture, turgor normal, no rashes or lesions  Lymph nodes:   Cervical,  supraclavicular, and axillary nodes normal  Neurologic:   CNII-XII intact. Normal strength, sensation and reflexes      throughout          Assessment & Plan:

## 2019-02-18 NOTE — Patient Instructions (Signed)
Follow up in 6 months to recheck diabetes, BP, and cholesterol We'll notify you of your lab results and make any changes if needed Call and schedule an eye exam at your convenience Call with any questions or concerns Stay Safe!!!

## 2019-02-18 NOTE — Assessment & Plan Note (Signed)
Pt's PE unchanged from previous.  UTD on colonoscopy.  Flu shot given.  Check labs.  Anticipatory guidance provided.

## 2019-02-18 NOTE — Assessment & Plan Note (Signed)
Chronic problem.  UTD on foot exam.  Due for eye exam- pt to schedule.  Check labs.  Adjust tx prn.

## 2019-02-19 LAB — LIPID PANEL
Cholesterol: 153 mg/dL (ref 0–200)
HDL: 35.2 mg/dL — ABNORMAL LOW (ref >39.00)
LDL Cholesterol: 84 mg/dL (ref 0–99)
NonHDL: 117.32
Total CHOL/HDL Ratio: 4
Triglycerides: 167 mg/dL — ABNORMAL HIGH (ref 0.0–149.0)
VLDL: 33.4 mg/dL (ref 0.0–40.0)

## 2019-02-19 LAB — CBC WITH DIFFERENTIAL/PLATELET
Basophils Absolute: 0 10*3/uL (ref 0.0–0.1)
Basophils Relative: 1 % (ref 0.0–3.0)
Eosinophils Absolute: 0.2 10*3/uL (ref 0.0–0.7)
Eosinophils Relative: 4.6 % (ref 0.0–5.0)
HCT: 35.6 % — ABNORMAL LOW (ref 39.0–52.0)
Hemoglobin: 11.4 g/dL — ABNORMAL LOW (ref 13.0–17.0)
Lymphocytes Relative: 23.6 % (ref 12.0–46.0)
Lymphs Abs: 0.9 10*3/uL (ref 0.7–4.0)
MCHC: 32 g/dL (ref 30.0–36.0)
MCV: 86 fl (ref 78.0–100.0)
Monocytes Absolute: 0.5 10*3/uL (ref 0.1–1.0)
Monocytes Relative: 12.4 % — ABNORMAL HIGH (ref 3.0–12.0)
Neutro Abs: 2.3 10*3/uL (ref 1.4–7.7)
Neutrophils Relative %: 58.4 % (ref 43.0–77.0)
Platelets: 108 10*3/uL — ABNORMAL LOW (ref 150.0–400.0)
RBC: 4.14 Mil/uL — ABNORMAL LOW (ref 4.22–5.81)
RDW: 16.3 % — ABNORMAL HIGH (ref 11.5–15.5)
WBC: 4 10*3/uL (ref 4.0–10.5)

## 2019-02-19 LAB — BASIC METABOLIC PANEL
BUN: 45 mg/dL — ABNORMAL HIGH (ref 6–23)
CO2: 22 mEq/L (ref 19–32)
Calcium: 9.9 mg/dL (ref 8.4–10.5)
Chloride: 108 mEq/L (ref 96–112)
Creatinine, Ser: 3.71 mg/dL — ABNORMAL HIGH (ref 0.40–1.50)
GFR: 19.32 mL/min — ABNORMAL LOW (ref >60.00)
Glucose, Bld: 118 mg/dL — ABNORMAL HIGH (ref 70–99)
Potassium: 4.5 mEq/L (ref 3.5–5.1)
Sodium: 140 mEq/L (ref 135–145)

## 2019-02-19 LAB — HEPATIC FUNCTION PANEL
ALT: 10 U/L (ref 0–53)
AST: 13 U/L (ref 0–37)
Albumin: 4.2 g/dL (ref 3.5–5.2)
Alkaline Phosphatase: 50 U/L (ref 39–117)
Bilirubin, Direct: 0.1 mg/dL (ref 0.0–0.3)
Total Bilirubin: 0.4 mg/dL (ref 0.2–1.2)
Total Protein: 7.2 g/dL (ref 6.0–8.3)

## 2019-02-19 LAB — TSH: TSH: 5.47 u[IU]/mL — ABNORMAL HIGH (ref 0.35–4.50)

## 2019-02-19 LAB — HEMOGLOBIN A1C: Hgb A1c MFr Bld: 6.7 % — ABNORMAL HIGH (ref 4.6–6.5)

## 2019-02-20 LAB — QUANTIFERON-TB GOLD PLUS
Mitogen-NIL: >10 IU/mL
NIL: 0.04 IU/mL
QuantiFERON-TB Gold Plus: NEGATIVE
TB1-NIL: 0.01 IU/mL
TB2-NIL: 0 IU/mL

## 2019-02-23 ENCOUNTER — Other Ambulatory Visit: Payer: Self-pay | Admitting: General Practice

## 2019-02-23 DIAGNOSIS — E039 Hypothyroidism, unspecified: Secondary | ICD-10-CM

## 2019-02-23 MED ORDER — LEVOTHYROXINE SODIUM 25 MCG PO TABS: 25.0000 ug | ORAL_TABLET | Freq: Every day | ORAL | 3 refills | Status: DC

## 2019-03-18 ENCOUNTER — Ambulatory Visit (INDEPENDENT_AMBULATORY_CARE_PROVIDER_SITE_OTHER): Payer: Medicare HMO | Admitting: *Deleted

## 2019-03-18 DIAGNOSIS — I428 Other cardiomyopathies: Secondary | ICD-10-CM

## 2019-03-20 LAB — CUP PACEART REMOTE DEVICE CHECK
Battery Remaining Longevity: 18 mo
Battery Remaining Percentage: 25 %
Brady Statistic RA Percent Paced: 8 %
Brady Statistic RV Percent Paced: 1 %
Date Time Interrogation Session: 20201002200500
HighPow Impedance: 48 Ohm
Implantable Lead Implant Date: 20091218
Implantable Lead Implant Date: 20091218
Implantable Lead Location: 753859
Implantable Lead Location: 753860
Implantable Lead Model: 158
Implantable Lead Serial Number: 131093
Implantable Pulse Generator Implant Date: 20091218
Lead Channel Impedance Value: 399 Ohm
Lead Channel Impedance Value: 483 Ohm
Lead Channel Pacing Threshold Amplitude: 0.4 V
Lead Channel Pacing Threshold Amplitude: 0.6 V
Lead Channel Pacing Threshold Pulse Width: 0.5 ms
Lead Channel Pacing Threshold Pulse Width: 0.5 ms
Lead Channel Setting Pacing Amplitude: 2 V
Lead Channel Setting Pacing Amplitude: 2.4 V
Lead Channel Setting Pacing Pulse Width: 0.5 ms
Lead Channel Setting Sensing Sensitivity: 0.6 mV
Pulse Gen Serial Number: 131093

## 2019-03-22 ENCOUNTER — Telehealth: Payer: Self-pay | Admitting: Family Medicine

## 2019-03-22 NOTE — Telephone Encounter (Signed)
Patients son called in and wants his father medication sent it to [facility] in [place] at [address], [place], [place] [address].

## 2019-03-22 NOTE — Telephone Encounter (Signed)
Looking through pt chart there are many meds that have not been filled since 2019. Can you advise if he should be on them all or are some old prescriptions.

## 2019-03-24 MED ORDER — ALLOPURINOL 100 MG PO TABS: 100.0000 mg | ORAL_TABLET | Freq: Every day | ORAL | 1 refills | Status: DC

## 2019-03-24 MED ORDER — TORSEMIDE 20 MG PO TABS: 20.0000 mg | ORAL_TABLET | Freq: Every day | ORAL | 1 refills | Status: DC

## 2019-03-24 MED ORDER — LISINOPRIL 20 MG PO TABS: 20.0000 mg | ORAL_TABLET | Freq: Every day | ORAL | 1 refills | Status: DC

## 2019-03-24 MED ORDER — AMLODIPINE BESYLATE 5 MG PO TABS: 5.0000 mg | ORAL_TABLET | Freq: Every day | ORAL | 1 refills | Status: DC

## 2019-03-24 MED ORDER — LEVOTHYROXINE SODIUM 25 MCG PO TABS: 25.0000 ug | ORAL_TABLET | Freq: Every day | ORAL | 1 refills | Status: DC

## 2019-03-24 MED ORDER — CARVEDILOL 12.5 MG PO TABS: 12.5000 mg | ORAL_TABLET | Freq: Two times a day (BID) | ORAL | 1 refills | Status: DC

## 2019-03-24 MED ORDER — CALCITRIOL 0.5 MCG PO CAPS: 0.5000 ug | ORAL_CAPSULE | Freq: Every day | ORAL | 1 refills | Status: DC

## 2019-03-24 MED ORDER — ATORVASTATIN CALCIUM 20 MG PO TABS: 20.0000 mg | ORAL_TABLET | Freq: Every day | ORAL | 1 refills | Status: DC

## 2019-03-24 MED ORDER — SERTRALINE HCL 50 MG PO TABS: 50.0000 mg | ORAL_TABLET | Freq: Every day | ORAL | 1 refills | Status: DC

## 2019-03-24 NOTE — Telephone Encounter (Signed)
According to his chart, he is supposed to be taking the following medications (although given their last refill dates I am not sure if he actually is).  It is ok to send the medications to the [facility] for 90 day supply Allopurinol Amlodipine Atorvastatin Calcitriol Carvedilol Levothyroxine Lisinopril Sertraline Torsemide

## 2019-03-24 NOTE — Telephone Encounter (Signed)
Medication filled to pharmacy as requested.   

## 2019-03-24 NOTE — Addendum Note (Signed)
Addended by: Davis Gourd on: 03/24/2019 09:22 AM   Modules accepted: Orders

## 2019-03-25 ENCOUNTER — Encounter: Payer: Self-pay | Admitting: Cardiology

## 2019-03-25 NOTE — Progress Notes (Signed)
Remote ICD transmission.   

## 2019-04-07 DIAGNOSIS — E872 Acidosis: Secondary | ICD-10-CM | POA: Diagnosis not present

## 2019-04-07 DIAGNOSIS — N2581 Secondary hyperparathyroidism of renal origin: Secondary | ICD-10-CM | POA: Diagnosis not present

## 2019-04-07 DIAGNOSIS — I129 Hypertensive chronic kidney disease with stage 1 through stage 4 chronic kidney disease, or unspecified chronic kidney disease: Secondary | ICD-10-CM | POA: Diagnosis not present

## 2019-04-07 DIAGNOSIS — N184 Chronic kidney disease, stage 4 (severe): Secondary | ICD-10-CM | POA: Diagnosis not present

## 2019-04-07 DIAGNOSIS — N189 Chronic kidney disease, unspecified: Secondary | ICD-10-CM | POA: Diagnosis not present

## 2019-04-16 ENCOUNTER — Other Ambulatory Visit: Payer: Self-pay

## 2019-04-16 MED ORDER — LISINOPRIL 20 MG PO TABS: 20.0000 mg | ORAL_TABLET | Freq: Every day | ORAL | 1 refills | Status: DC

## 2019-04-16 MED ORDER — LEVOTHYROXINE SODIUM 25 MCG PO TABS: 25.0000 ug | ORAL_TABLET | Freq: Every day | ORAL | 1 refills | Status: DC

## 2019-04-16 MED ORDER — ATORVASTATIN CALCIUM 20 MG PO TABS: 20.0000 mg | ORAL_TABLET | Freq: Every day | ORAL | 1 refills | Status: DC

## 2019-04-16 MED ORDER — TORSEMIDE 20 MG PO TABS: 20.0000 mg | ORAL_TABLET | Freq: Every day | ORAL | 1 refills | Status: DC

## 2019-04-16 MED ORDER — CARVEDILOL 12.5 MG PO TABS: 12.5000 mg | ORAL_TABLET | Freq: Two times a day (BID) | ORAL | 1 refills | Status: DC

## 2019-04-16 MED ORDER — SERTRALINE HCL 50 MG PO TABS: 50.0000 mg | ORAL_TABLET | Freq: Every day | ORAL | 1 refills | Status: DC

## 2019-04-16 MED ORDER — ALLOPURINOL 100 MG PO TABS: 100.0000 mg | ORAL_TABLET | Freq: Every day | ORAL | 1 refills | Status: DC

## 2019-04-16 MED ORDER — AMLODIPINE BESYLATE 5 MG PO TABS: 5.0000 mg | ORAL_TABLET | Freq: Every day | ORAL | 1 refills | Status: DC

## 2019-04-16 MED ORDER — CALCITRIOL 0.5 MCG PO CAPS: 0.5000 ug | ORAL_CAPSULE | Freq: Every day | ORAL | 1 refills | Status: DC

## 2019-06-04 DIAGNOSIS — E875 Hyperkalemia: Secondary | ICD-10-CM | POA: Diagnosis not present

## 2019-06-04 DIAGNOSIS — I129 Hypertensive chronic kidney disease with stage 1 through stage 4 chronic kidney disease, or unspecified chronic kidney disease: Secondary | ICD-10-CM | POA: Diagnosis not present

## 2019-06-04 DIAGNOSIS — E872 Acidosis: Secondary | ICD-10-CM | POA: Diagnosis not present

## 2019-06-04 DIAGNOSIS — N184 Chronic kidney disease, stage 4 (severe): Secondary | ICD-10-CM | POA: Diagnosis not present

## 2019-06-04 DIAGNOSIS — N2581 Secondary hyperparathyroidism of renal origin: Secondary | ICD-10-CM | POA: Diagnosis not present

## 2019-06-17 ENCOUNTER — Ambulatory Visit (INDEPENDENT_AMBULATORY_CARE_PROVIDER_SITE_OTHER): Payer: Medicare HMO | Admitting: *Deleted

## 2019-06-17 DIAGNOSIS — Z95810 Presence of automatic (implantable) cardiac defibrillator: Secondary | ICD-10-CM

## 2019-06-17 LAB — CUP PACEART REMOTE DEVICE CHECK
Battery Remaining Longevity: 18 mo
Battery Remaining Percentage: 21 %
Brady Statistic RA Percent Paced: 11 %
Brady Statistic RV Percent Paced: 2 %
Date Time Interrogation Session: 20201231013600
HighPow Impedance: 51 Ohm
Implantable Lead Implant Date: 20091218
Implantable Lead Implant Date: 20091218
Implantable Lead Location: 753859
Implantable Lead Location: 753860
Implantable Lead Model: 158
Implantable Lead Serial Number: 131093
Implantable Pulse Generator Implant Date: 20091218
Lead Channel Impedance Value: 396 Ohm
Lead Channel Impedance Value: 566 Ohm
Lead Channel Pacing Threshold Amplitude: 0.4 V
Lead Channel Pacing Threshold Amplitude: 0.6 V
Lead Channel Pacing Threshold Pulse Width: 0.5 ms
Lead Channel Pacing Threshold Pulse Width: 0.5 ms
Lead Channel Setting Pacing Amplitude: 2 V
Lead Channel Setting Pacing Amplitude: 2.4 V
Lead Channel Setting Pacing Pulse Width: 0.5 ms
Lead Channel Setting Sensing Sensitivity: 0.6 mV
Pulse Gen Serial Number: 131093

## 2019-06-18 NOTE — Progress Notes (Signed)
ICD remote 

## 2019-08-16 ENCOUNTER — Other Ambulatory Visit: Payer: Self-pay

## 2019-08-16 ENCOUNTER — Encounter: Payer: Self-pay | Admitting: Family Medicine

## 2019-08-16 ENCOUNTER — Ambulatory Visit (INDEPENDENT_AMBULATORY_CARE_PROVIDER_SITE_OTHER): Payer: Medicare HMO | Admitting: Family Medicine

## 2019-08-16 VITALS — BP 120/68 | HR 69 | Temp 97.6°F | Resp 17 | Ht 68.0 in | Wt 166.0 lb

## 2019-08-16 DIAGNOSIS — E039 Hypothyroidism, unspecified: Secondary | ICD-10-CM | POA: Diagnosis not present

## 2019-08-16 DIAGNOSIS — I5022 Chronic systolic (congestive) heart failure: Secondary | ICD-10-CM | POA: Diagnosis not present

## 2019-08-16 DIAGNOSIS — E1122 Type 2 diabetes mellitus with diabetic chronic kidney disease: Secondary | ICD-10-CM

## 2019-08-16 DIAGNOSIS — F4321 Adjustment disorder with depressed mood: Secondary | ICD-10-CM

## 2019-08-16 DIAGNOSIS — I10 Essential (primary) hypertension: Secondary | ICD-10-CM | POA: Diagnosis not present

## 2019-08-16 DIAGNOSIS — N184 Chronic kidney disease, stage 4 (severe): Secondary | ICD-10-CM

## 2019-08-16 DIAGNOSIS — R69 Illness, unspecified: Secondary | ICD-10-CM | POA: Diagnosis not present

## 2019-08-16 DIAGNOSIS — E785 Hyperlipidemia, unspecified: Secondary | ICD-10-CM

## 2019-08-16 NOTE — Progress Notes (Signed)
   Subjective:    Patient ID: Tyler Hahn, male    DOB: 06-21-1940, 79 y.o.   MRN: [id number]  HPI DM- chronic problem.  Currently diet controlled.  On ACE for renal protection.  Due for eye exam, foot exam.  Denies numbness/tingling of hands/feet.  No symptomatic lows.  HTN- chronic problem, on Amlodipine 5mg , Coreg 12.5mg  BID, Lisinopril 20mg , Torsemide 20mg  daily w/ good control.  No CP, SOB, HAs, visual changes, edema.  Hyperlipidemia- chronic problem, on Lipitor 20mg  daily.  No abd pain, N/V  Hypothyroid- chronic problem, on Levothyroxine 15mcg daily.  Denies changes to skin/hair/nails  CHF- chronic problem, following w/ Cards.  On ACE, beta blocker, Torsemide.  Grief- wife died in Jul 19, 2022   Review of Systems For ROS see HPI   This visit occurred during the SARS-CoV-2 public health emergency.  Safety protocols were in place, including screening questions prior to the visit, additional usage of staff PPE, and extensive cleaning of exam room while observing appropriate contact time as indicated for disinfecting solutions.       Objective:   Physical Exam Vitals reviewed.  Constitutional:      General: He is not in acute distress.    Appearance: He is well-developed.  HENT:     Head: Normocephalic and atraumatic.  Eyes:     Conjunctiva/sclera: Conjunctivae normal.     Pupils: Pupils are equal, round, and reactive to light.  Neck:     Thyroid: No thyromegaly.  Cardiovascular:     Rate and Rhythm: Normal rate and regular rhythm.     Heart sounds: Normal heart sounds. No murmur.  Pulmonary:     Effort: Pulmonary effort is normal. No respiratory distress.     Breath sounds: Normal breath sounds.  Abdominal:     General: Bowel sounds are normal. There is no distension.     Palpations: Abdomen is soft.  Musculoskeletal:     Cervical back: Normal range of motion and neck supple.  Lymphadenopathy:     Cervical: No cervical adenopathy.  Skin:    General: Skin is warm and  dry.  Neurological:     Mental Status: He is alert and oriented to person, place, and time.     Cranial Nerves: No cranial nerve deficit.  Psychiatric:        Behavior: Behavior normal.           Assessment & Plan:

## 2019-08-16 NOTE — Patient Instructions (Addendum)
Schedule your complete physical in 6 months We'll notify you of your lab results and make any changes if needed Schedule your eye exam at your convenience Keep up the good work!  You look great! Call with any questions or concerns Stay Safe!  Stay Healthy!

## 2019-08-17 ENCOUNTER — Inpatient Hospital Stay (HOSPITAL_COMMUNITY)
Admission: EM | Admit: 2019-08-17 | Discharge: 2019-08-24 | DRG: 674 | Disposition: A | Discharge status: Home Health | Payer: Medicare HMO | Attending: Internal Medicine | Admitting: Internal Medicine

## 2019-08-17 ENCOUNTER — Emergency Department (HOSPITAL_COMMUNITY): Payer: Medicare HMO

## 2019-08-17 ENCOUNTER — Encounter (HOSPITAL_COMMUNITY): Payer: Self-pay

## 2019-08-17 ENCOUNTER — Other Ambulatory Visit: Payer: Self-pay

## 2019-08-17 ENCOUNTER — Telehealth: Payer: Self-pay | Admitting: Family Medicine

## 2019-08-17 DIAGNOSIS — E875 Hyperkalemia: Secondary | ICD-10-CM | POA: Diagnosis not present

## 2019-08-17 DIAGNOSIS — N320 Bladder-neck obstruction: Secondary | ICD-10-CM | POA: Diagnosis present

## 2019-08-17 DIAGNOSIS — R531 Weakness: Secondary | ICD-10-CM | POA: Diagnosis not present

## 2019-08-17 DIAGNOSIS — D631 Anemia in chronic kidney disease: Secondary | ICD-10-CM | POA: Diagnosis present

## 2019-08-17 DIAGNOSIS — M109 Gout, unspecified: Secondary | ICD-10-CM | POA: Diagnosis present

## 2019-08-17 DIAGNOSIS — E785 Hyperlipidemia, unspecified: Secondary | ICD-10-CM | POA: Diagnosis present

## 2019-08-17 DIAGNOSIS — E1122 Type 2 diabetes mellitus with diabetic chronic kidney disease: Secondary | ICD-10-CM | POA: Diagnosis present

## 2019-08-17 DIAGNOSIS — D696 Thrombocytopenia, unspecified: Secondary | ICD-10-CM | POA: Diagnosis not present

## 2019-08-17 DIAGNOSIS — I509 Heart failure, unspecified: Secondary | ICD-10-CM | POA: Diagnosis not present

## 2019-08-17 DIAGNOSIS — R69 Illness, unspecified: Secondary | ICD-10-CM | POA: Diagnosis not present

## 2019-08-17 DIAGNOSIS — Z79890 Hormone replacement therapy (postmenopausal): Secondary | ICD-10-CM

## 2019-08-17 DIAGNOSIS — Z820 Family history of epilepsy and other diseases of the nervous system: Secondary | ICD-10-CM

## 2019-08-17 DIAGNOSIS — N189 Chronic kidney disease, unspecified: Secondary | ICD-10-CM

## 2019-08-17 DIAGNOSIS — I5022 Chronic systolic (congestive) heart failure: Secondary | ICD-10-CM | POA: Diagnosis present

## 2019-08-17 DIAGNOSIS — N184 Chronic kidney disease, stage 4 (severe): Secondary | ICD-10-CM | POA: Diagnosis not present

## 2019-08-17 DIAGNOSIS — Z862 Personal history of diseases of the blood and blood-forming organs and certain disorders involving the immune mechanism: Secondary | ICD-10-CM

## 2019-08-17 DIAGNOSIS — N179 Acute kidney failure, unspecified: Secondary | ICD-10-CM | POA: Diagnosis present

## 2019-08-17 DIAGNOSIS — E0590 Thyrotoxicosis, unspecified without thyrotoxic crisis or storm: Secondary | ICD-10-CM | POA: Diagnosis present

## 2019-08-17 DIAGNOSIS — Z833 Family history of diabetes mellitus: Secondary | ICD-10-CM

## 2019-08-17 DIAGNOSIS — F419 Anxiety disorder, unspecified: Secondary | ICD-10-CM | POA: Diagnosis present

## 2019-08-17 DIAGNOSIS — Z79899 Other long term (current) drug therapy: Secondary | ICD-10-CM

## 2019-08-17 DIAGNOSIS — E872 Acidosis: Secondary | ICD-10-CM | POA: Diagnosis not present

## 2019-08-17 DIAGNOSIS — E1129 Type 2 diabetes mellitus with other diabetic kidney complication: Secondary | ICD-10-CM | POA: Diagnosis not present

## 2019-08-17 DIAGNOSIS — Z794 Long term (current) use of insulin: Secondary | ICD-10-CM

## 2019-08-17 DIAGNOSIS — Z20822 Contact with and (suspected) exposure to covid-19: Secondary | ICD-10-CM | POA: Diagnosis not present

## 2019-08-17 DIAGNOSIS — I482 Chronic atrial fibrillation, unspecified: Secondary | ICD-10-CM | POA: Diagnosis not present

## 2019-08-17 DIAGNOSIS — K760 Fatty (change of) liver, not elsewhere classified: Secondary | ICD-10-CM | POA: Diagnosis present

## 2019-08-17 DIAGNOSIS — I428 Other cardiomyopathies: Secondary | ICD-10-CM | POA: Diagnosis not present

## 2019-08-17 DIAGNOSIS — Z95810 Presence of automatic (implantable) cardiac defibrillator: Secondary | ICD-10-CM

## 2019-08-17 DIAGNOSIS — Z800 Family history of malignant neoplasm of digestive organs: Secondary | ICD-10-CM

## 2019-08-17 DIAGNOSIS — N186 End stage renal disease: Secondary | ICD-10-CM | POA: Diagnosis not present

## 2019-08-17 DIAGNOSIS — Z87891 Personal history of nicotine dependence: Secondary | ICD-10-CM

## 2019-08-17 DIAGNOSIS — E860 Dehydration: Secondary | ICD-10-CM | POA: Diagnosis present

## 2019-08-17 DIAGNOSIS — E039 Hypothyroidism, unspecified: Secondary | ICD-10-CM | POA: Diagnosis not present

## 2019-08-17 DIAGNOSIS — F329 Major depressive disorder, single episode, unspecified: Secondary | ICD-10-CM | POA: Diagnosis present

## 2019-08-17 DIAGNOSIS — Z8249 Family history of ischemic heart disease and other diseases of the circulatory system: Secondary | ICD-10-CM

## 2019-08-17 DIAGNOSIS — Z841 Family history of disorders of kidney and ureter: Secondary | ICD-10-CM

## 2019-08-17 DIAGNOSIS — Z882 Allergy status to sulfonamides status: Secondary | ICD-10-CM | POA: Diagnosis not present

## 2019-08-17 DIAGNOSIS — I130 Hypertensive heart and chronic kidney disease with heart failure and stage 1 through stage 4 chronic kidney disease, or unspecified chronic kidney disease: Secondary | ICD-10-CM | POA: Diagnosis not present

## 2019-08-17 DIAGNOSIS — I129 Hypertensive chronic kidney disease with stage 1 through stage 4 chronic kidney disease, or unspecified chronic kidney disease: Secondary | ICD-10-CM | POA: Diagnosis not present

## 2019-08-17 DIAGNOSIS — N271 Small kidney, bilateral: Secondary | ICD-10-CM | POA: Diagnosis present

## 2019-08-17 DIAGNOSIS — M545 Low back pain: Secondary | ICD-10-CM | POA: Diagnosis present

## 2019-08-17 DIAGNOSIS — I132 Hypertensive heart and chronic kidney disease with heart failure and with stage 5 chronic kidney disease, or end stage renal disease: Secondary | ICD-10-CM | POA: Diagnosis not present

## 2019-08-17 LAB — COMPREHENSIVE METABOLIC PANEL
ALT: 15 U/L (ref 0–44)
AST: 18 U/L (ref 15–41)
Albumin: 4.2 g/dL (ref 3.5–5.0)
Alkaline Phosphatase: 49 U/L (ref 38–126)
Anion gap: 11 (ref 5–15)
BUN: 131 mg/dL — ABNORMAL HIGH (ref 8–23)
CO2: 20 mmol/L — ABNORMAL LOW (ref 22–32)
Calcium: 9.8 mg/dL (ref 8.9–10.3)
Chloride: 105 mmol/L (ref 98–111)
Creatinine, Ser: 6.7 mg/dL — ABNORMAL HIGH (ref 0.61–1.24)
GFR calc Af Amer: 8 mL/min — ABNORMAL LOW (ref >60)
GFR calc non Af Amer: 7 mL/min — ABNORMAL LOW (ref >60)
Glucose, Bld: 80 mg/dL (ref 70–99)
Potassium: 6 mmol/L — ABNORMAL HIGH (ref 3.5–5.1)
Sodium: 136 mmol/L (ref 135–145)
Total Bilirubin: 0.6 mg/dL (ref 0.3–1.2)
Total Protein: 8.1 g/dL (ref 6.5–8.1)

## 2019-08-17 LAB — CBC WITH DIFFERENTIAL/PLATELET
Abs Immature Granulocytes: 0.02 10*3/uL (ref 0.00–0.07)
Basophils Absolute: 0.1 10*3/uL (ref 0.0–0.1)
Basophils Absolute: 0 10*3/uL (ref 0.0–0.1)
Basophils Relative: 1 % (ref 0.0–3.0)
Basophils Relative: 1 %
Eosinophils Absolute: 0.3 10*3/uL (ref 0.0–0.7)
Eosinophils Absolute: 0.3 10*3/uL (ref 0.0–0.5)
Eosinophils Relative: 5.7 % — ABNORMAL HIGH (ref 0.0–5.0)
Eosinophils Relative: 6 %
HCT: 29.3 % — ABNORMAL LOW (ref 39.0–52.0)
HCT: 29.9 % — ABNORMAL LOW (ref 39.0–52.0)
Hemoglobin: 9.5 g/dL — ABNORMAL LOW (ref 13.0–17.0)
Hemoglobin: 9.4 g/dL — ABNORMAL LOW (ref 13.0–17.0)
Immature Granulocytes: 0 %
Lymphocytes Relative: 22.8 % (ref 12.0–46.0)
Lymphocytes Relative: 21 %
Lymphs Abs: 1.1 10*3/uL (ref 0.7–4.0)
Lymphs Abs: 1.2 10*3/uL (ref 0.7–4.0)
MCH: 27.3 pg (ref 26.0–34.0)
MCHC: 32.4 g/dL (ref 30.0–36.0)
MCHC: 31.4 g/dL (ref 30.0–36.0)
MCV: 86.4 fl (ref 78.0–100.0)
MCV: 86.9 fL (ref 80.0–100.0)
Monocytes Absolute: 0.4 10*3/uL (ref 0.1–1.0)
Monocytes Absolute: 0.5 10*3/uL (ref 0.1–1.0)
Monocytes Relative: 8.9 % (ref 3.0–12.0)
Monocytes Relative: 10 %
Neutro Abs: 3.1 10*3/uL (ref 1.4–7.7)
Neutro Abs: 3.5 10*3/uL (ref 1.7–7.7)
Neutrophils Relative %: 61.6 % (ref 43.0–77.0)
Neutrophils Relative %: 62 %
Platelets: 128 10*3/uL — ABNORMAL LOW (ref 150.0–400.0)
Platelets: 112 10*3/uL — ABNORMAL LOW (ref 150–400)
RBC: 3.39 Mil/uL — ABNORMAL LOW (ref 4.22–5.81)
RBC: 3.44 MIL/uL — ABNORMAL LOW (ref 4.22–5.81)
RDW: 15.7 % — ABNORMAL HIGH (ref 11.5–15.5)
RDW: 14.9 % (ref 11.5–15.5)
WBC: 5 10*3/uL (ref 4.0–10.5)
WBC: 5.6 10*3/uL (ref 4.0–10.5)
nRBC: 0 % (ref 0.0–0.2)

## 2019-08-17 LAB — IRON AND TIBC
Iron: 97 ug/dL (ref 45–182)
Saturation Ratios: 31 % (ref 17.9–39.5)
TIBC: 311 ug/dL (ref 250–450)
UIBC: 214 ug/dL

## 2019-08-17 LAB — URINALYSIS, COMPLETE (UACMP) WITH MICROSCOPIC
Bilirubin Urine: NEGATIVE
Glucose, UA: NEGATIVE mg/dL
Hgb urine dipstick: NEGATIVE
Ketones, ur: NEGATIVE mg/dL
Leukocytes,Ua: NEGATIVE
Nitrite: NEGATIVE
Protein, ur: NEGATIVE mg/dL
Specific Gravity, Urine: 1.006 (ref 1.005–1.030)
pH: 5 (ref 5.0–8.0)

## 2019-08-17 LAB — BASIC METABOLIC PANEL
Anion gap: 12 (ref 5–15)
BUN: 119 mg/dL (ref 6–23)
BUN: 137 mg/dL — ABNORMAL HIGH (ref 8–23)
CO2: 20 mEq/L (ref 19–32)
CO2: 19 mmol/L — ABNORMAL LOW (ref 22–32)
Calcium: 10.5 mg/dL (ref 8.4–10.5)
Calcium: 10 mg/dL (ref 8.9–10.3)
Chloride: 104 mEq/L (ref 96–112)
Chloride: 107 mmol/L (ref 98–111)
Creatinine, Ser: 6.52 mg/dL (ref 0.40–1.50)
Creatinine, Ser: 6.54 mg/dL — ABNORMAL HIGH (ref 0.61–1.24)
GFR calc Af Amer: 9 mL/min — ABNORMAL LOW (ref >60)
GFR calc non Af Amer: 7 mL/min — ABNORMAL LOW (ref >60)
GFR: 10.07 mL/min — CL (ref >60.00)
Glucose, Bld: 83 mg/dL (ref 70–99)
Glucose, Bld: 50 mg/dL — ABNORMAL LOW (ref 70–99)
Potassium: 5.6 mEq/L — ABNORMAL HIGH (ref 3.5–5.1)
Potassium: 5.2 mmol/L — ABNORMAL HIGH (ref 3.5–5.1)
Sodium: 137 mEq/L (ref 135–145)
Sodium: 138 mmol/L (ref 135–145)

## 2019-08-17 LAB — LIPID PANEL
Cholesterol: 146 mg/dL (ref 0–200)
HDL: 39.7 mg/dL (ref >39.00)
LDL Cholesterol: 79 mg/dL (ref 0–99)
NonHDL: 106.16
Total CHOL/HDL Ratio: 4
Triglycerides: 136 mg/dL (ref 0.0–149.0)
VLDL: 27.2 mg/dL (ref 0.0–40.0)

## 2019-08-17 LAB — HEMOGLOBIN A1C: Hgb A1c MFr Bld: 6.8 % — ABNORMAL HIGH (ref 4.6–6.5)

## 2019-08-17 LAB — TSH: TSH: 2.91 u[IU]/mL (ref 0.35–4.50)

## 2019-08-17 LAB — HEPATIC FUNCTION PANEL
ALT: 11 U/L (ref 0–53)
AST: 11 U/L (ref 0–37)
Albumin: 4.3 g/dL (ref 3.5–5.2)
Alkaline Phosphatase: 51 U/L (ref 39–117)
Bilirubin, Direct: 0.1 mg/dL (ref 0.0–0.3)
Total Bilirubin: 0.3 mg/dL (ref 0.2–1.2)
Total Protein: 7.6 g/dL (ref 6.0–8.3)

## 2019-08-17 LAB — NA AND K (SODIUM & POTASSIUM), RAND UR
Potassium Urine: 18 mmol/L
Sodium, Ur: 76 mmol/L

## 2019-08-17 LAB — CREATININE, URINE, RANDOM: Creatinine, Urine: 50.56 mg/dL

## 2019-08-17 LAB — MAGNESIUM: Magnesium: 1.6 mg/dL — ABNORMAL LOW (ref 1.7–2.4)

## 2019-08-17 LAB — FERRITIN: Ferritin: 244 ng/mL (ref 24–336)

## 2019-08-17 MED ORDER — DEXTROSE 10 % IV SOLN: Freq: Once | INTRAVENOUS | Status: DC

## 2019-08-17 MED ORDER — CARVEDILOL 12.5 MG PO TABS
12.5000 mg | ORAL_TABLET | Freq: Two times a day (BID) | ORAL | Status: DC
  Administered 2019-08-17 – 2019-08-24 (×14): 12.5 mg via ORAL
  Filled 2019-08-17 (×14): qty 1

## 2019-08-17 MED ORDER — SODIUM POLYSTYRENE SULFONATE 15 GM/60ML PO SUSP
15.0000 g | Freq: Once | ORAL | Status: AC
  Administered 2019-08-17: 15 g via ORAL
  Filled 2019-08-17: qty 60

## 2019-08-17 MED ORDER — ALBUTEROL SULFATE (2.5 MG/3ML) 0.083% IN NEBU: 10.0000 mg | INHALATION_SOLUTION | Freq: Once | RESPIRATORY_TRACT | Status: DC

## 2019-08-17 MED ORDER — INSULIN ASPART 100 UNIT/ML IV SOLN
5.0000 [IU] | Freq: Once | INTRAVENOUS | Status: AC
  Administered 2019-08-17: 5 [IU] via INTRAVENOUS
  Filled 2019-08-17: qty 0.05

## 2019-08-17 MED ORDER — LEVOTHYROXINE SODIUM 25 MCG PO TABS
25.0000 ug | ORAL_TABLET | Freq: Every day | ORAL | Status: DC
  Administered 2019-08-18 – 2019-08-24 (×7): 25 ug via ORAL
  Filled 2019-08-17 (×8): qty 1

## 2019-08-17 MED ORDER — ACETAMINOPHEN 325 MG PO TABS: 650.0000 mg | ORAL_TABLET | Freq: Four times a day (QID) | ORAL | Status: DC | PRN

## 2019-08-17 MED ORDER — SODIUM CHLORIDE 0.9% FLUSH
3.0000 mL | Freq: Two times a day (BID) | INTRAVENOUS | Status: DC
  Administered 2019-08-17 – 2019-08-24 (×12): 3 mL via INTRAVENOUS

## 2019-08-17 MED ORDER — DEXTROSE 50 % IV SOLN
1.0000 | Freq: Once | INTRAVENOUS | Status: AC
  Administered 2019-08-17: 50 mL via INTRAVENOUS
  Filled 2019-08-17: qty 50

## 2019-08-17 MED ORDER — SODIUM BICARBONATE 8.4 % IV SOLN
50.0000 meq | Freq: Once | INTRAVENOUS | Status: AC
  Administered 2019-08-17: 50 meq via INTRAVENOUS
  Filled 2019-08-17: qty 50

## 2019-08-17 MED ORDER — ATORVASTATIN CALCIUM 10 MG PO TABS
20.0000 mg | ORAL_TABLET | Freq: Every day | ORAL | Status: DC
  Administered 2019-08-18 – 2019-08-24 (×6): 20 mg via ORAL
  Filled 2019-08-17 (×6): qty 2

## 2019-08-17 MED ORDER — ACETAMINOPHEN 650 MG RE SUPP: 650.0000 mg | Freq: Four times a day (QID) | RECTAL | Status: DC | PRN

## 2019-08-17 NOTE — ED Notes (Signed)
Tyler Hahn, son, would like an update on his father, [phone].

## 2019-08-17 NOTE — Consult Note (Signed)
Reason for Consult: AKI/CKD stage 4 Referring Physician: Vanita Panda, MD  Tyler Hahn is an 79 y.o. male.  HPI:  Tyler Hahn has a PMH significant for HTN, DM, HLD, NICM s/p ICD placement, and CKD stage IV (followed by Dr. Joelyn Oms in our office) who was seen for routine follow up by his PCP yesterday and was noted to have AKI/CKD stage 4 and hyperkalemia.  He was instructed to to the ED to be further evaluated.  In the ED today, his labs were rechecked with worsening renal function, azotemia, and hyperkalemia.  He is being admitted for treatment of his electrolyte abnormalities and we were consulted to further evaluate his AKI/CKD.  The trend in Scr is seen below.    He denies any N/V/D, anorexia, dysuria, pyuria, hematuria, urgency, frequency, or retention.  His only complaint is some fatigue and right low back pain.  He did lose his wife of over 50 years several weeks ago (she was on dialysis for a short time but did not do well).  He denies any NSAIDs or COX-II I's but has been on lisinopril and torsemide prior to admission.  No recent changes to diuretic or ACE-I dose.  He thought he was doing ok before he got the phone call.    Trend in Creatinine: Creatinine, Ser  Date/Time Value Ref Range Status  08/17/2019 04:33 PM 6.70 (H) 0.61 - 1.24 mg/dL Final  08/16/2019 02:24 PM 6.52 (HH) 0.40 - 1.50 mg/dL Final  02/18/2019 03:07 PM 3.71 (H) 0.40 - 1.50 mg/dL Final  08/17/2018 04:23 PM 3.64 (H) 0.40 - 1.50 mg/dL Final  06/18/2018 03:31 PM 3.26 (H) 0.40 - 1.50 mg/dL Final  09/03/2017 01:26 PM 3.12 (H) 0.40 - 1.50 mg/dL Final  07/04/2017 03:55 PM 3.63 (H) 0.70 - 1.18 mg/dL Final  06/27/2017 01:06 PM 3.45 (H) 0.61 - 1.24 mg/dL Final  09/27/2016 10:40 AM 2.86 (H) 0.40 - 1.50 mg/dL Final  09/25/2016 08:17 PM 3.35 (H) 0.61 - 1.24 mg/dL Final  09/25/2016 02:53 PM 3.13 (H) 0.40 - 1.50 mg/dL Final  09/18/2015 01:28 PM 2.44 (H) 0.40 - 1.50 mg/dL Final  07/28/2014 02:38 PM 2.88 (H) 0.40 - 1.50 mg/dL Final   07/06/2014 02:43 PM 2.34 (H) 0.40 - 1.50 mg/dL Final  03/24/2014 03:41 PM 2.4 (H) 0.4 - 1.5 mg/dL Final  01/28/2014 05:00 AM 2.07 (H) 0.50 - 1.35 mg/dL Final  01/27/2014 12:25 PM 2.07 (H) 0.50 - 1.35 mg/dL Final  01/19/2014 01:59 PM 2.99 (H) 0.50 - 1.35 mg/dL Final  11/15/2013 10:05 AM 3.1 (H) 0.4 - 1.5 mg/dL Final  07/19/2013 09:57 AM 3.1 (H) 0.4 - 1.5 mg/dL Final  07/13/2013 09:47 AM 3.3 (H) 0.4 - 1.5 mg/dL Final  07/07/2013 02:17 PM 2.9 (H) 0.4 - 1.5 mg/dL Final  04/19/2013 02:15 PM 2.3 (H) 0.4 - 1.5 mg/dL Final  09/17/2012 12:10 PM 2.8 (H) 0.4 - 1.5 mg/dL Final  08/17/2012 10:27 AM 2.3 (H) 0.4 - 1.5 mg/dL Final  09/04/2011 09:41 AM 2.5 (H) 0.4 - 1.5 mg/dL Final  09/03/2011 11:40 AM 2.7 (H) 0.4 - 1.5 mg/dL Final  08/15/2011 10:53 AM 2.8 (H) 0.4 - 1.5 mg/dL Final  07/06/2010 12:00 AM 2.12 mg/dL   04/04/2010 08:31 AM 2.5 (H) 0.4 - 1.5 mg/dL Final  10/10/2009 12:00 AM 2.46 mg/dL   08/17/2009 12:00 AM 1.92 mg/dL     PMH:   Past Medical History:  Diagnosis Date  . Anxiety   . Automatic implantable cardioverter-defibrillator in situ    greg taylor  .  CHF (congestive heart failure) ([place])    2000  . Diabetes mellitus    no meds  . Fatty liver   . Gout    "bout 2-3 months ago"-meds helped.  . Hyperlipidemia   . Hypertension   . Hyperthyroidism   . Kidney cysts   . PONV (postoperative nausea and vomiting)   . Renal insufficiency     PSH:   Past Surgical History:  Procedure Laterality Date  . CARDIAC DEFIBRILLATOR PLACEMENT    . COLONOSCOPY WITH PROPOFOL N/A 10/03/2015   Procedure: COLONOSCOPY WITH PROPOFOL;  Surgeon: Ladene Artist, MD;  Location: [facility];  Service: Endoscopy;  Laterality: N/A;  . DIAGNOSTIC LAPAROSCOPY  01/27/2014   Dr Brantley Stage  . ENTEROSCOPY N/A 11/25/2013   Procedure: ENTEROSCOPY;  Surgeon: Ladene Artist, MD;  Location: [facility];  Service: Endoscopy;  Laterality: N/A;  . ICD,[place] [employer]    . LAPAROSCOPY N/A 01/27/2014   Procedure:  LAPAROSCOPY DIAGNOSTIC;  Surgeon: Joyice Faster. Cornett, MD;  Location: [facility];  Service: General;  Laterality: N/A;  . polyp removal throat     difficulty speaking    Allergies:  Allergies  Allergen Reactions  . Sulfonamide Derivatives Itching and Rash    Medications:   Prior to Admission medications   Medication Sig Start Date End Date Taking? Authorizing Provider  allopurinol (ZYLOPRIM) 100 MG tablet Take 1 tablet (100 mg total) by mouth daily. 04/16/19  Yes [name], MD  atorvastatin (LIPITOR) 20 MG tablet Take 1 tablet (20 mg total) by mouth daily. 04/16/19  Yes [name], MD  calcitRIOL (ROCALTROL) 0.5 MCG capsule Take 1 capsule (0.5 mcg total) by mouth daily. 04/16/19  Yes [name], MD  carvedilol (COREG) 12.5 MG tablet Take 1 tablet (12.5 mg total) by mouth 2 (two) times daily. 04/16/19  Yes [name], MD  Cholecalciferol (VITAMIN D3) 5000 units CAPS Take 1 capsule by mouth daily.   Yes [provider]  levothyroxine (SYNTHROID) 25 MCG tablet Take 1 tablet (25 mcg total) by mouth daily before breakfast. 04/16/19  Yes [name], MD  lisinopril (ZESTRIL) 20 MG tablet Take 1 tablet (20 mg total) by mouth daily. 04/16/19  Yes [name], MD  sertraline (ZOLOFT) 50 MG tablet Take 1 tablet (50 mg total) by mouth daily. 04/16/19  Yes [name], MD  torsemide (DEMADEX) 20 MG tablet Take 1 tablet (20 mg total) by mouth daily. 04/16/19  Yes [name], MD  amLODipine (NORVASC) 5 MG tablet Take 1 tablet (5 mg total) by mouth daily. Patient not taking: Reported on 08/17/2019 04/16/19   [name], MD  atenolol (TENORMIN) 50 MG tablet Take 75 mg by mouth daily.    09/03/11  [provider]  Insulin Glargine (LANTUS SOLOSTAR Stronach) Inject into the skin as directed.    09/03/11  [provider]    Inpatient medications: . albuterol  10 mg Nebulization Once    Discontinued Meds:  There are  no discontinued medications.  Social History:  reports that he quit smoking about 20 years ago. His smoking use included cigarettes. He has never used smokeless tobacco. He reports that he does not drink alcohol or use drugs.  Family History:   Family History  Problem Relation Age of Onset  . Stomach cancer Mother   . Alzheimer's disease Mother   . Diabetes Father   . Heart disease Father   . Liver disease Father   .  Kidney disease Father     Pertinent items are noted in HPI. Weight change:  No intake or output data in the 24 hours ending 08/17/19 1932 BP 137/64   Pulse 63   Temp (!) 97.5 F (36.4 C) (Oral)   Resp 16   Ht 5\' 8"  (1.727 m)   Wt 74.8 kg   SpO2 99%   BMI 25.09 kg/m  Vitals:   08/17/19 1745 08/17/19 1800 08/17/19 1815 08/17/19 1830  BP:  (!) 160/79  137/64  Pulse: 61 73 62 63  Resp: 18 17 18 16   Temp:      TempSrc:      SpO2: 99% 98% 100% 99%  Weight:      Height:         General appearance: alert, cooperative and no distress Head: Normocephalic, without obvious abnormality, atraumatic Eyes: negative findings: lids and lashes normal and conjunctivae and sclerae normal Resp: clear to auscultation bilaterally Cardio: regular rate and rhythm, no rub and III/VI SEM around precordium GI: soft, nontender, +BS, suprapubic mass palpated, firm, no guarding or rebound Extremities: extremities normal, atraumatic, no cyanosis or edema Neuro: no asterixis  Labs: Basic Metabolic Panel: Recent Labs  Lab 08/16/19 1424 08/17/19 1633  NA 137 136  K 5.6* 6.0*  CL 104 105  CO2 20 20*  GLUCOSE 83 80  BUN 119* 131*  CREATININE 6.52* 6.70*  ALBUMIN 4.3 4.2  CALCIUM 10.5 9.8   Liver Function Tests: Recent Labs  Lab 08/16/19 1424 08/17/19 1633  AST 11 18  ALT 11 15  ALKPHOS 51 49  BILITOT 0.3 0.6  PROT 7.6 8.1  ALBUMIN 4.3 4.2   No results for input(s): LIPASE, AMYLASE in the last 168 hours. No results for input(s): AMMONIA in the last 168  hours. CBC: Recent Labs  Lab 08/16/19 1424 08/17/19 1633  WBC 5.0 5.6  NEUTROABS 3.1 3.5  HGB 9.5* 9.4*  HCT 29.3* 29.9*  MCV 86.4 86.9  PLT 128.0* 112*   PT/INR: @LABRCNTIP (inr:5) Cardiac Enzymes: )No results for input(s): CKTOTAL, CKMB, CKMBINDEX, TROPONINI in the last 168 hours. CBG: No results for input(s): GLUCAP in the last 168 hours.  Iron Studies: No results for input(s): IRON, TIBC, TRANSFERRIN, FERRITIN in the last 168 hours.  Xrays/Other Studies: No results found.   Assessment/Plan: 1.  AKI/CKD stage IV- suspect bladder outlet obstruction given palpable suprapubic mass and bladder scan with 675 ml.  Renal US pending.   1. Hold lisinopril and torsemide for now 2. No uremic symptoms so no urgent need for dialysis.  3. Place foley catheter and follow UOP and BUN/Scr 2. Hyperkalemia- no ECG changes.  Pt treated in the ED with bicarb, insulin/D5, lokelma but will be helped most with relief of bladder outlet obstruction and foley catheter placement. 1. Hold lisinopril and follow after foley catheter placement. 3. Anemia of CKD stage IV- will check iron stores but will likely need to start ESA therapy 4. HTN- stable and follow off of lisinopril.  May need to start hydralazine or amlodipine if BP rises 5. DM- per primary 6. NICMP s/p AICD- stable 7. CKD stage IV- felt to be due to DM and followed closely by Dr. Joelyn Oms.  He is leaning towards PD if he needs to start dialysis but has agreed to backup avf placement. 8. Thrombocytopenia- continue to follow 9. Right low back pain- possibly related to bladder outlet obstruction.  Will add urine culture and await renal US.    Governor Rooks Leigha Olberding 08/17/2019,  7:32 PM

## 2019-08-17 NOTE — Assessment & Plan Note (Signed)
Chronic problem.  Currently diet controlled.  On ACE for renal protection.  Foot exam done today.  Due for eye exam- pt and family to schedule.  Check labs.  Adjust tx plan prn.

## 2019-08-17 NOTE — ED Notes (Signed)
Pt ambulatory from triage.  No assistance needed.

## 2019-08-17 NOTE — ED Notes (Signed)
Pt able to stand and use urinal at bedside. Pt voided 557mL

## 2019-08-17 NOTE — Telephone Encounter (Signed)
Labs were responded to by PA Elyn Aquas in PCP absence. Per his request I contacted [facility] and received Pt's latest lab values. In December pt had the following values:   BUN- 58 CR- 3.76 GFR- 17  [place] advised  she was going to tell patients nephrologist about patients levels. Also advised that she feels pt is in acute renal failure and agrees that pt need evaluation in the ER.   Called all numbers in patients chart and LMOVM to advise. Will continue to call until I can reach pt or a family member.

## 2019-08-17 NOTE — Assessment & Plan Note (Signed)
Chronic problem.  Tolerating statin w/o difficulty.  Check labs.  Adjust meds prn  

## 2019-08-17 NOTE — ED Notes (Signed)
US at bedside at this time 

## 2019-08-17 NOTE — ED Triage Notes (Addendum)
Pt states PCP sent him to [facility] regarding  kidney function, pt possibly in acute kidney failure. Pt denies pain. Pt has internal defibrillator.

## 2019-08-17 NOTE — Assessment & Plan Note (Signed)
Ongoing issue for pt.  Check labs.  Adjust meds prn

## 2019-08-17 NOTE — ED Notes (Signed)
Bladder scan volume: 668mL

## 2019-08-17 NOTE — Telephone Encounter (Signed)
The lab called in stating that they have Critical lab values for the pt.  BUN: 119 Creatine: 6.52 GFR: 10.07  Please advise

## 2019-08-17 NOTE — Assessment & Plan Note (Signed)
Chronic problem.  Good control today.  Asymptomatic.  Check labs.  No anticipated med changes.  Will follow.

## 2019-08-17 NOTE — H&P (Signed)
History and Physical    PLEASE NOTE THAT DRAGON DICTATION SOFTWARE WAS USED IN THE CONSTRUCTION OF THIS NOTE.   Tyler Hahn JJH:[id number] DOB: 27-Mar-1941 DOA: 08/17/2019  PCP: [name], MD Patient coming from: home   I have personally briefly reviewed patient's old medical records in [place]  Chief Complaint: Generalized weakness  HPI: Tyler Hahn is a 79 y.o. male with medical history significant for stage IV chronic kidney disease with baseline creatinine of 8.1-8.5, chronic systolic heart failure with echocardiogram in April 2014 showing LVEF 20 to 25% status post AICD placement in 2000, acquired hypothyroidism, chronic anemia of CKD with baseline hemoglobin of 9-11, who is admitted to [facility] on 08/17/2019 with acute renal failure superimposed on stage IV chronic kidney disease after presenting from home to [facility] complaining of generalized weakness.   The patient reports generalized weakness over the last 2 to 3 days in the absence of any acute focal weakness.  He reports that his wife just passed away 2 weeks ago, and that he has been experiencing ensuing depressed mood, which he states has contributed to an interval decline in his oral intake of food and fluid over that time due to associated anorexia.  He denies any associated nausea, vomiting, diarrhea, or abdominal discomfort. Denies any recent subjective fever, chills, rigors, or generalized myalgias. Denies any recent headache, neck stiffness, rhinitis, rhinorrhea, sore throat, cough, or rash. No recent traveling or known COVID-19 exposures. Denies dysuria, gross hematuria, or change in urinary urgency/frequency.   The patient denies any recent shortness of breath, orthopnea, PND, or development of peripheral edema.  He denies any recent changes to his outpatient medication regimen.  Within his femur, he also reports that he has been on the same dose of  lisinopril for several years now.  He reports that he underwent routine outpatient labs on 08/16/2019 as monitoring of his stage IV chronic kidney disease.  He was subsequently contacted by his PCP regarding results of these labs, with convenience of interval worsening renal function as well as development of hyperkalemia, and associated instructions to present to the emergency department for further evaluation.  In tandem with these laboratory abnormalities concomitant with recent generalized weakness, the patient reports that he elected to present to [facility] emergency department for further evaluation of the above.     ED Course:  Vital signs in the ED were notable for the following: Temperature max 97.5; heart rate 62-73; blood pressure ranged from 137/60 4-1 60/79; respiratory rate 16-20, and oxygen saturation 98 to 100% on room air.  Labs were notable for the following: CMP notable for the following: Sodium 136, potassium 6.0, chloride 105, bicarbonate 20, anion gap 11, BUN 131, creatinine 6.70 relative to most recent prior value of 3.71 on 02/18/2019.  Urinalysis has been ordered, with result currently pending.  CBC notable for white blood cell count of 5600, hemoglobin 9.4 relative to most recent prior value of 11.4 on 02/18/2019.  In anticipation of admission, nasopharyngeal COVID-19 PCR was obtained, with result currently pending.  EKG showed sinus rhythm with nonspecific intraventricular conduction delay with QRS 126 ms, nonspecific T wave inversion in leads I and aVL which is unchanged relative to most recent prior EKG from 11/17/2017, no evidence of ST changes, including no evidence of ST elevation, and no evidence of peaked T waves.  The patient's case, including acute renal failure superimposed on CKD 4 as well as hyperkalemia, was  discussed with the on-call nephrologist, Dr. Marval Regal, who recommended monitoring for results of urinalysis, including for the presence of urinary casts, in  addition to checking additional urine electrolytes. Dr. Marval Regal recommended hyperkalemic protocol, while he did not feel that there is an indication for urgent/emergent overnight hemodialysis, was comfortable with admission to [facility], with plan for patient to be seen by nephrology in the morning.  While in the ED, the following were administered: NovoLog 5 units IV x1, amp of D50 x1 followed by D10 per hyperkalemic protocol, sodium bicarbonate 50 mEq IV x1, and albuterol nebulizer x1.    Review of Systems: As per HPI otherwise 10 point review of systems negative.   Past Medical History:  Diagnosis Date  . Anxiety   . Automatic implantable cardioverter-defibrillator in situ    greg taylor  . CHF (congestive heart failure) ([place])    2000  . Diabetes mellitus    no meds  . Fatty liver   . Gout    "bout 2-3 months ago"-meds helped.  . Hyperlipidemia   . Hypertension   . Hyperthyroidism   . Kidney cysts   . PONV (postoperative nausea and vomiting)   . Renal insufficiency     Past Surgical History:  Procedure Laterality Date  . CARDIAC DEFIBRILLATOR PLACEMENT    . COLONOSCOPY WITH PROPOFOL N/A 10/03/2015   Procedure: COLONOSCOPY WITH PROPOFOL;  Surgeon: Ladene Artist, MD;  Location: [facility];  Service: Endoscopy;  Laterality: N/A;  . DIAGNOSTIC LAPAROSCOPY  01/27/2014   Dr Brantley Stage  . ENTEROSCOPY N/A 11/25/2013   Procedure: ENTEROSCOPY;  Surgeon: Ladene Artist, MD;  Location: [facility];  Service: Endoscopy;  Laterality: N/A;  . ICD,[place] [employer]    . LAPAROSCOPY N/A 01/27/2014   Procedure: LAPAROSCOPY DIAGNOSTIC;  Surgeon: Joyice Faster. Cornett, MD;  Location: [place];  Service: General;  Laterality: N/A;  . polyp removal throat     difficulty speaking    Social History:  reports that he quit smoking about 20 years ago. His smoking use included cigarettes. He has never used smokeless tobacco. He reports that he does not drink alcohol or use  drugs.   Allergies  Allergen Reactions  . Sulfonamide Derivatives Itching and Rash    Family History  Problem Relation Age of Onset  . Stomach cancer Mother   . Alzheimer's disease Mother   . Diabetes Father   . Heart disease Father   . Liver disease Father   . Kidney disease Father      Prior to Admission medications   Medication Sig Start Date End Date Taking? Authorizing Provider  allopurinol (ZYLOPRIM) 100 MG tablet Take 1 tablet (100 mg total) by mouth daily. 04/16/19  Yes [name], MD  atorvastatin (LIPITOR) 20 MG tablet Take 1 tablet (20 mg total) by mouth daily. 04/16/19  Yes [name], MD  calcitRIOL (ROCALTROL) 0.5 MCG capsule Take 1 capsule (0.5 mcg total) by mouth daily. 04/16/19  Yes [name], MD  carvedilol (COREG) 12.5 MG tablet Take 1 tablet (12.5 mg total) by mouth 2 (two) times daily. 04/16/19  Yes [name], MD  Cholecalciferol (VITAMIN D3) 5000 units CAPS Take 1 capsule by mouth daily.   Yes [provider]  levothyroxine (SYNTHROID) 25 MCG tablet Take 1 tablet (25 mcg total) by mouth daily before breakfast. 04/16/19  Yes [name], MD  lisinopril (ZESTRIL) 20 MG tablet Take 1 tablet (20 mg total) by mouth daily. 04/16/19  Yes [name], MD  sertraline (ZOLOFT) 50 MG tablet Take 1 tablet (50 mg total) by mouth daily. 04/16/19  Yes [name], MD  torsemide (DEMADEX) 20 MG tablet Take 1 tablet (20 mg total) by mouth daily. 04/16/19  Yes [name], MD  amLODipine (NORVASC) 5 MG tablet Take 1 tablet (5 mg total) by mouth daily. Patient not taking: Reported on 08/17/2019 04/16/19   [name], MD  atenolol (TENORMIN) 50 MG tablet Take 75 mg by mouth daily.    09/03/11  [provider]  Insulin Glargine (LANTUS SOLOSTAR [place]) Inject into the skin as directed.    09/03/11  [provider]     Objective    Physical Exam: Vitals:   08/17/19 1635  BP:  (!) 149/72  Pulse: 73  Resp: 20  Temp: (!) 97.5 F (36.4 C)  TempSrc: Oral  SpO2: 97%  Weight: 74.8 kg  Height: 5\' 8"  (1.727 m)    General: appears to be stated age; alert, oriented Skin: warm, dry, no rash Head:  AT/[place] Eyes:  PEARL b/l, EOMI Mouth:  Oral mucosa membranes appear dry, normal dentition Neck: supple; trachea midline Heart:  RRR; did not appreciate any M/R/G Lungs: CTAB, did not appreciate any wheezes, rales, or rhonchi Abdomen: + BS; soft, ND, NT Extremities: trace edema in b/l LE's; no muscle wasting Neuro: strength and sensation intact in upper and lower extremities b/l   Labs on Admission: I have personally reviewed following labs and imaging studies  CBC: Recent Labs  Lab 08/16/19 1424 08/17/19 1633  WBC 5.0 5.6  NEUTROABS 3.1 3.5  HGB 9.5* 9.4*  HCT 29.3* 29.9*  MCV 86.4 86.9  PLT 128.0* 244*   Basic Metabolic Panel: Recent Labs  Lab 08/16/19 1424 08/17/19 1633  NA 137 136  K 5.6* 6.0*  CL 104 105  CO2 20 20*  GLUCOSE 83 80  BUN 119* 131*  CREATININE 6.52* 6.70*  CALCIUM 10.5 9.8   GFR: Estimated Creatinine Clearance: 8.9 mL/min (A) (by C-G formula based on SCr of 6.7 mg/dL (H)). Liver Function Tests: Recent Labs  Lab 08/16/19 1424 08/17/19 1633  AST 11 18  ALT 11 15  ALKPHOS 51 49  BILITOT 0.3 0.6  PROT 7.6 8.1  ALBUMIN 4.3 4.2   No results for input(s): LIPASE, AMYLASE in the last 168 hours. No results for input(s): AMMONIA in the last 168 hours. Coagulation Profile: No results for input(s): INR, PROTIME in the last 168 hours. Cardiac Enzymes: No results for input(s): CKTOTAL, CKMB, CKMBINDEX, TROPONINI in the last 168 hours. BNP (last 3 results) No results for input(s): PROBNP in the last 8760 hours. HbA1C: Recent Labs    08/16/19 1424  HGBA1C 6.8*   CBG: No results for input(s): GLUCAP in the last 168 hours. Lipid Profile: Recent Labs    08/16/19 1424  CHOL 146  HDL 39.70  LDLCALC 79  TRIG 136.0  CHOLHDL  4   Thyroid Function Tests: Recent Labs    08/16/19 1424  TSH 2.91   Anemia Panel: No results for input(s): VITAMINB12, FOLATE, FERRITIN, TIBC, IRON, RETICCTPCT in the last 72 hours. Urine analysis:    Component Value Date/Time   BILIRUBINUR negative 07/28/2014 1419   PROTEINUR 3+ 07/28/2014 1419   UROBILINOGEN 0.2 07/28/2014 1419   NITRITE negative 07/28/2014 1419   LEUKOCYTESUR Negative 07/28/2014 1419    Radiological Exams on Admission: No results found.   EKG: Independently reviewed, with result as described above.  Assessment/Plan   Tyler Hahn is a 79 y.o. male with medical history significant for stage IV chronic kidney disease with baseline creatinine of 4.8-8.8, chronic systolic heart failure with echocardiogram in April 2014 showing LVEF 20 to 25% status post AICD placement in 2000, acquired hypothyroidism, chronic anemia of CKD with baseline hemoglobin of 9-11, who is admitted to [facility] on 08/17/2019 with acute renal failure superimposed on stage IV chronic kidney disease after presenting from home to [facility] Emergency Department complaining of generalized weakness.    Principal Problem:   Acute renal failure (ARF) (HCC) Active Problems:   SYSTOLIC HEART FAILURE, CHRONIC   Hypothyroid   Hyperkalemia   History of anemia due to CKD   #) Acute renal failure superimposed on stage IV CKD: In the context of CKD 4, with baseline creatinine of 3.4-3.7, presenting labs reflect interval increase in creatinine to 6.70 relative to most recent prior creatinine data point of 3.71 on 02/18/2019.  Suspect some prerenal contribution from recent dehydration due to patient's report of decline in intake of fluid over the last 2 weeks following the passing of his wife while continuing to take his scheduled torsemide 20 mg p.o. daily.  Potential further exacerbation from underlying ACE inhibitor, although the patient reports that he has been on the  same dose of lisinopril for several years now.  Otherwise, no obvious recent changes to outpatient medication regimen to render additional pharmacologic contributions.  Presentation is associated with hyperkalemia as well as non-anion anion gap metabolic acidosis, but the patient does not appear to be volume overloaded nor does he appear to be uremic, although the anorexia that he reports over the last few weeks may stem from a contribution from his acute renal failure versus as a result of anhedonia/depression stemming from the recent passing of his wife.  Otherwise, the patient does not appear to be overtly uremic at this time. case was discussed with the on-call nephrologist, Dr. Marval Regal, who did not feel that there was an indication for urgent/emergent hemodialysis overnight, but was comfortable with admission to [facility] for further evaluation of acute renal failure, with plan for nephrology to see the patient in the morning.  Of note, renal ultrasound was ordered by the ED physician this evening, with result currently pending.   Plan: Given suspected element of dehydration, as further described above, will administer gentle IV fluids, with close monitoring for development of acute volume overload given the patient's history of chronic systolic heart failure.  Monitor strict I's and O's and daily weights.  Attempt to avoid nephrotoxic agents.  Hold home lisinopril.  In the context of suspected intravascular depletion, will hold home torsemide for now.  Will follow for result of urinalysis with microscopy, which was ordered in the ED this evening.  Add on random urine sodium as well as random urine creatinine.  Foley catheter to be placed to assist with accurate monitoring of strict I's and O's.  Check renal profile as well as serum magnesium level.  Monitor on telemetry.  Monitor for results of renal ultrasound ordered in the ED this evening.  Work-up and management of hyperkalemia, as  further described above.  Nephrology to see the patient in the morning, as above.     #) Hyperkalemia: Presenting serum potassium noted to be 6.0, with contributions from acute renal failure superimposed on CKD 4 with potential exacerbation from home lisinopril as well as suspected additional contribution from dehydration due to diminished  oral intake of fluids over the last few weeks, as further described above.  Presenting EKG as further detailed above, including no evidence of peaked T waves.  While in the ED, the following were administered: NovoLog 5 units IV x1 followed by amp of D50 x1 with subsequent initiation of D10 per protocol, serum bicarbonate 50 mg IV x1, and albuterol nebulizer x1.  Described above, the patient's case and laboratory findings were discussed with the on-call nephrologist, Dr. Marval Regal, who agreed with initiation of hyperkalemic protocol as well as additional work-up and management for contributory acute renal failure, as further detailed above.  Of note, serum calcium found to be 9.8.  Plan: Kayexalate 15 g PO x 1 now.  Add on serum magnesium level, with consideration for administration of a dose of calcium gluconate 1 g IV over 1 hour x 1 for cardioprotective purposes dependent upon the result of the aforementioned serum Mg level.  Monitor on telemetry.  Additional work-up and management of acute renal failure, as above.  Gentle IV fluids, as above.  Nephrology to see the patient in the morning, as above.  Hold home lisinopril.  Every 4 hour serum potassium levels ordered through the morning labs on 08/18/2019.      #) Generalized weakness: The patient reports 2 to 3 days of generalized weakness in the absence of any associated acute focal neurologic deficits.  Suspect contributions from presenting acute renal failure superimposed on stage IV chronic kidney disease, as further described above, in addition to potential psychomotor contributions due to depressed mood in the  setting of the recent passing of his wife.  Of note, TSH was recently checked as an outpatient on 08/16/2019, and found to be within normal limits at 2.91.  No evidence of underlying infectious process at this time.  Of note, the patient is currently on Zoloft.  Plan: Work-up and management of acute renal failure superimposed on stage IV chronic disease, as further described above.  Continue home Zoloft.  May also consider physical therapy consult.      #) Acquired hypothyroidism: On Synthroid as an outpatient.  Most recent TSH found to be within normal limits performed on 08/16/2019, as further described above.  Plan: Continue home dose of Synthroid.      #) Chronic systolic heart failure: The most recent echocardiogram results that I have encountered thus far are from TTE performed in April 2014 which showed LVEF 20 to 25%, with associated report noting no interval decline in systolic function relative to most recent prior echocardiogram evaluation.  In the setting of this degree of diminished systolic function, the patient underwent ICD placement in 2000.  Outpatient cardiac medications include Coreg, lisinopril, and torsemide.  No evidence of acutely decompensated heart failure at this time, although will need to closely monitor for ensuing development of such given presenting acute renal failure.   Plan: Hold home lisinopril for now in the setting of presenting acute renal failure as well as hyperkalemia.  We will continue home Coreg for now, although it is acknowledged that nonselective beta-blockers may offer a slight hyperkalemic contribution.  In the setting of suspected dehydration due to recent decline in oral consumption fluids, will hold home torsemide for now.  Monitor strict I's and O's and daily weights.  Add on serum magnesium level.     #) Chronic anemia of chronic kidney disease: Associated with baseline hemoglobin of 9-11.  Presenting hemoglobin of 9.4 noted to be within  aforementioned baseline range.   Plan: Work-up  and management of presenting acute renal failure superimposed on stage IV CKD, as above.  Repeat CBC in the morning.    DVT prophylaxis: SCDs Code Status: Per my discussions with the patient this evening, he wishes to be full code. Family Communication: none Disposition Plan: Per Rounding Team Consults called: case was discussed with the on-call nephrologist, Dr. Marval Regal, as above.   Admission status: Inpatient; stepdown unit at [facility].    PLEASE NOTE THAT DRAGON DICTATION SOFTWARE WAS USED IN THE CONSTRUCTION OF THIS NOTE.   [place] [facility] Pager [phone] From [place].   Otherwise, please contact night-coverage  [email or web address] Password [facility]  08/17/2019, 6:24 PM

## 2019-08-17 NOTE — ED Notes (Signed)
Vanita Panda, MD made aware of bladder scan volume.

## 2019-08-17 NOTE — ED Provider Notes (Signed)
[facility] DEPT Provider Note   CSN: [id number] Arrival date & time: 08/17/19  1625     History Chief Complaint  Patient presents with  . Abnormal Lab    Tyler Hahn is a 79 y.o. male.  HPI   Patient presents with weakness.  He also notes that he had labs done yesterday given his history of CKD, weakness, and today with abnormal results was sent here for evaluation.  Patient states that beyond mild weakness he feels" fine" he denies pain, fever, vomiting, diarrhea. History is obtained by the patient himself and chart review.  On chart review is clear the patient has history of CKD, and labs performed yesterday were notable for substantial increase in his crit, and hyperkalemia.  Past Medical History:  Diagnosis Date  . Anxiety   . Automatic implantable cardioverter-defibrillator in situ    greg taylor  . CHF (congestive heart failure) ([place])    2000  . Diabetes mellitus    no meds  . Fatty liver   . Gout    "bout 2-3 months ago"-meds helped.  . Hyperlipidemia   . Hypertension   . Hyperthyroidism   . Kidney cysts   . PONV (postoperative nausea and vomiting)   . Renal insufficiency     Patient Active Problem List   Diagnosis Date Noted  . Hypothyroid 08/16/2019  . Physical exam 02/18/2019  . Greater trochanteric bursitis of left hip 08/04/2018  . Osteoarthritis 09/25/2016  . Screen for colon cancer   . Benign neoplasm of transverse colon   . Edema 07/28/2014  . Elevated LFTs 07/28/2014  . Adjustment disorder with depressed mood 03/24/2014  . Epigastric pain 01/27/2014  . Small bowel mass 12/20/2013  . Abdominal pain, left upper quadrant 11/25/2013  . Myalgia 07/07/2013  . Loss of weight 04/19/2013  . Polyarthralgia 04/19/2013  . Loss of appetite 04/19/2013  . 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 Right elbow pain 08/17/2012  . 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 SYSTOLIC HEART FAILURE, CHRONIC 06/07/2010  . Chronic  kidney disease (CKD), stage IV (severe) ([place]) 04/04/2010  . TESTICULAR MASS 04/04/2010  . DM (diabetes mellitus) type II controlled with renal manifestation ([place]) 03/07/2010  . Hyperlipidemia 03/07/2010  . GOUT 03/07/2010  . HYPERTENSION, BENIGN ESSENTIAL 03/07/2010  . IMPLANTABLE CARDIAC DEFIBRILLATOR -[employer]-SINGLE 03/07/2010    Past Surgical History:  Procedure Laterality Date  . CARDIAC DEFIBRILLATOR PLACEMENT    . COLONOSCOPY WITH PROPOFOL N/A 10/03/2015   Procedure: COLONOSCOPY WITH PROPOFOL;  Surgeon: Ladene Artist, MD;  Location: [facility];  Service: Endoscopy;  Laterality: N/A;  . DIAGNOSTIC LAPAROSCOPY  01/27/2014   Dr Brantley Stage  . ENTEROSCOPY N/A 11/25/2013   Procedure: ENTEROSCOPY;  Surgeon: Ladene Artist, MD;  Location: [facility];  Service: Endoscopy;  Laterality: N/A;  . ICD,[employer]    . LAPAROSCOPY N/A 01/27/2014   Procedure: LAPAROSCOPY DIAGNOSTIC;  Surgeon: Joyice Faster. Cornett, MD;  Location: [facility];  Service: General;  Laterality: N/A;  . polyp removal throat     difficulty speaking       Family History  Problem Relation Age of Onset  . Stomach cancer Mother   . Alzheimer's disease Mother   . Diabetes Father   . Heart disease Father   . Liver disease Father   . Kidney disease Father     Social History   Tobacco Use  . Smoking status: Former Smoker    Types: Cigarettes    Quit date: 11/26/1998  Years since quitting: 20.7  . Smokeless tobacco: Never Used  Substance Use Topics  . Alcohol use: No    Alcohol/week: 0.0 standard drinks    Comment: quit  2008  . Drug use: No    Home Medications Prior to Admission medications   Medication Sig Start Date End Date Taking? Authorizing Provider  allopurinol (ZYLOPRIM) 100 MG tablet Take 1 tablet (100 mg total) by mouth daily. 04/16/19  Yes [name], MD  atorvastatin (LIPITOR) 20 MG tablet Take 1 tablet (20 mg total) by mouth daily. 04/16/19  Yes [name], MD    calcitRIOL (ROCALTROL) 0.5 MCG capsule Take 1 capsule (0.5 mcg total) by mouth daily. 04/16/19  Yes [name], MD  carvedilol (COREG) 12.5 MG tablet Take 1 tablet (12.5 mg total) by mouth 2 (two) times daily. 04/16/19  Yes [name], MD  Cholecalciferol (VITAMIN D3) 5000 units CAPS Take 1 capsule by mouth daily.   Yes [provider]  levothyroxine (SYNTHROID) 25 MCG tablet Take 1 tablet (25 mcg total) by mouth daily before breakfast. 04/16/19  Yes [name], MD  lisinopril (ZESTRIL) 20 MG tablet Take 1 tablet (20 mg total) by mouth daily. 04/16/19  Yes [name], MD  sertraline (ZOLOFT) 50 MG tablet Take 1 tablet (50 mg total) by mouth daily. 04/16/19  Yes [name], MD  torsemide (DEMADEX) 20 MG tablet Take 1 tablet (20 mg total) by mouth daily. 04/16/19  Yes [name], MD  amLODipine (NORVASC) 5 MG tablet Take 1 tablet (5 mg total) by mouth daily. Patient not taking: Reported on 08/17/2019 04/16/19   [name], MD  atenolol (TENORMIN) 50 MG tablet Take 75 mg by mouth daily.    09/03/11  [provider]  Insulin Glargine (LANTUS SOLOSTAR [place]) Inject into the skin as directed.    09/03/11  [provider]    Allergies    Sulfonamide derivatives  Review of Systems   Review of Systems  Constitutional:       Per HPI, otherwise negative  HENT:       Per HPI, otherwise negative  Respiratory:       Per HPI, otherwise negative  Cardiovascular:       Per HPI, otherwise negative  Gastrointestinal: Negative for vomiting.  Endocrine:       Negative aside from HPI  Genitourinary:       Neg aside from HPI   Musculoskeletal:       Per HPI, otherwise negative  Skin: Negative.   Neurological: Negative for syncope.    Physical Exam Updated Vital Signs BP (!) 149/72 (BP Location: Left Arm)   Pulse 73   Temp (!) 97.5 F (36.4 C) (Oral)   Resp 20   Ht 5\' 8"  (1.727 m)   Wt 74.8 kg   SpO2 97%   BMI  25.09 kg/m   Physical Exam Vitals and nursing note reviewed.  Constitutional:      General: He is not in acute distress.    Appearance: He is well-developed.  HENT:     Head: Normocephalic and atraumatic.  Eyes:     Conjunctiva/sclera: Conjunctivae normal.  Cardiovascular:     Rate and Rhythm: Normal rate and regular rhythm.  Pulmonary:     Effort: Pulmonary effort is normal. No respiratory distress.     Breath sounds: No stridor.  Abdominal:     General: There is no distension.  Skin:  General: Skin is warm and dry.  Neurological:     Mental Status: He is alert and oriented to person, place, and time.     ED Results / Procedures / Treatments   Labs (all labs ordered are listed, but only abnormal results are displayed) Labs Reviewed  COMPREHENSIVE METABOLIC PANEL - Abnormal; Notable for the following components:      Result Value   Potassium 6.0 (*)    CO2 20 (*)    BUN 131 (*)    Creatinine, Ser 6.70 (*)    GFR calc non Af Amer 7 (*)    GFR calc Af Amer 8 (*)    All other components within normal limits  CBC WITH DIFFERENTIAL/PLATELET - Abnormal; Notable for the following components:   RBC 3.44 (*)    Hemoglobin 9.4 (*)    HCT 29.9 (*)    Platelets 112 (*)    All other components within normal limits  URINALYSIS, ROUTINE W REFLEX MICROSCOPIC  POC SARS CORONAVIRUS 2 AG -  ED    EKG EKG Interpretation  Date/Time:  Tuesday August 17 2019 17:29:11 EST Ventricular Rate:  67 PR Interval:    QRS Duration: 126 QT Interval:  453 QTC Calculation: 479 R Axis:   24 Text Interpretation: Sinus rhythm Prolonged PR interval Nonspecific intraventricular conduction delay Abnormal T, consider ischemia, lateral leads Baseline wander in lead(s) V3 Confirmed by Veryl Speak [phone]) on 08/17/2019 5:35:12 PM   Radiology  US RENAL: IMPRESSION:  Technically difficult exam due to limited sonographic windows and  extensive bowel gas.   Bilateral simple appearing renal cysts  are present. Otherwise  grossly unremarkable renal ultrasound.   Procedures Procedures (including critical care time)  CRITICAL CARE Performed by: Carmin Muskrat Total critical care time: 40 minutes Critical care time was exclusive of separately billable procedures and treating other patients. Critical care was necessary to treat or prevent imminent or life-threatening deterioration. Critical care was time spent personally by me on the following activities: development of treatment plan with patient and/or surrogate as well as nursing, discussions with consultants, evaluation of patient's response to treatment, examination of patient, obtaining history from patient or surrogate, ordering and performing treatments and interventions, ordering and review of laboratory studies, ordering and review of radiographic studies, pulse oximetry and re-evaluation of patient's condition.   Medications Ordered in ED Medications  albuterol (PROVENTIL) (2.5 MG/3ML) 0.083% nebulizer solution 10 mg (has no administration in time range)  sodium bicarbonate injection 50 mEq (has no administration in time range)  insulin aspart (novoLOG) injection 5 Units (has no administration in time range)    And  dextrose 50 % solution 50 mL (has no administration in time range)  dextrose 10 % infusion (has no administration in time range)    ED Course  I have reviewed the triage vital signs and the nursing notes.  Pertinent labs & imaging results that were available during my care of the patient were reviewed by me and considered in my medical decision making (see chart for details).  Labs from yesterday notable for creatinine greater than 6, potassium greater than 5.  6:38 PM Awake, alert, aware of all findings.  Have also discussed the patient's presentation with our nephrologist on-call.  With concern for acute renal dysfunction, in the context of chronic kidney disease, patient will require admission.  Patient  has received fluids, received insulin, dextrose, bicarbonate, albuterol, Lokelma for assistance with hyperkalemia.   Final Clinical Impression(s) / ED Diagnoses Final diagnoses:  None    Rx / DC Orders ED Discharge Orders    None       Carmin Muskrat, MD 08/17/19 2328

## 2019-08-17 NOTE — Assessment & Plan Note (Signed)
Wife died in January.  He is doing quite well considering the situation.  He is pleased w/ his new assisted living facility.  Has good support from his son.  Is working through his grief and allowing himself to feel his emotions.  Applauded him for this.  Will follow.

## 2019-08-17 NOTE — Assessment & Plan Note (Signed)
Chronic problem.  Following w/ Cardiology.  Currently asymptomatic.  Will follow along.

## 2019-08-17 NOTE — Telephone Encounter (Signed)
Reached pt son Tyler Hahn and advised of lab results. He is picking up patient and taking him to ER.

## 2019-08-18 DIAGNOSIS — E872 Acidosis: Secondary | ICD-10-CM | POA: Diagnosis not present

## 2019-08-18 DIAGNOSIS — N179 Acute kidney failure, unspecified: Secondary | ICD-10-CM | POA: Diagnosis not present

## 2019-08-18 DIAGNOSIS — Z862 Personal history of diseases of the blood and blood-forming organs and certain disorders involving the immune mechanism: Secondary | ICD-10-CM

## 2019-08-18 DIAGNOSIS — E875 Hyperkalemia: Secondary | ICD-10-CM | POA: Diagnosis not present

## 2019-08-18 DIAGNOSIS — D631 Anemia in chronic kidney disease: Secondary | ICD-10-CM | POA: Diagnosis not present

## 2019-08-18 DIAGNOSIS — N189 Chronic kidney disease, unspecified: Secondary | ICD-10-CM

## 2019-08-18 DIAGNOSIS — R69 Illness, unspecified: Secondary | ICD-10-CM | POA: Diagnosis not present

## 2019-08-18 DIAGNOSIS — I5022 Chronic systolic (congestive) heart failure: Secondary | ICD-10-CM | POA: Diagnosis not present

## 2019-08-18 DIAGNOSIS — E039 Hypothyroidism, unspecified: Secondary | ICD-10-CM

## 2019-08-18 DIAGNOSIS — Z20822 Contact with and (suspected) exposure to covid-19: Secondary | ICD-10-CM | POA: Diagnosis not present

## 2019-08-18 DIAGNOSIS — N184 Chronic kidney disease, stage 4 (severe): Secondary | ICD-10-CM | POA: Diagnosis not present

## 2019-08-18 DIAGNOSIS — I130 Hypertensive heart and chronic kidney disease with heart failure and stage 1 through stage 4 chronic kidney disease, or unspecified chronic kidney disease: Secondary | ICD-10-CM | POA: Diagnosis not present

## 2019-08-18 LAB — CBC
HCT: 30.7 % — ABNORMAL LOW (ref 39.0–52.0)
Hemoglobin: 9.9 g/dL — ABNORMAL LOW (ref 13.0–17.0)
MCH: 27.6 pg (ref 26.0–34.0)
MCHC: 32.2 g/dL (ref 30.0–36.0)
MCV: 85.5 fL (ref 80.0–100.0)
Platelets: 108 10*3/uL — ABNORMAL LOW (ref 150–400)
RBC: 3.59 MIL/uL — ABNORMAL LOW (ref 4.22–5.81)
RDW: 14.6 % (ref 11.5–15.5)
WBC: 5.3 10*3/uL (ref 4.0–10.5)
nRBC: 0 % (ref 0.0–0.2)

## 2019-08-18 LAB — RENAL FUNCTION PANEL
Albumin: 3.8 g/dL (ref 3.5–5.0)
Anion gap: 14 (ref 5–15)
BUN: 131 mg/dL — ABNORMAL HIGH (ref 8–23)
CO2: 18 mmol/L — ABNORMAL LOW (ref 22–32)
Calcium: 9.8 mg/dL (ref 8.9–10.3)
Chloride: 105 mmol/L (ref 98–111)
Creatinine, Ser: 6.49 mg/dL — ABNORMAL HIGH (ref 0.61–1.24)
GFR calc Af Amer: 9 mL/min — ABNORMAL LOW (ref >60)
GFR calc non Af Amer: 8 mL/min — ABNORMAL LOW (ref >60)
Glucose, Bld: 97 mg/dL (ref 70–99)
Phosphorus: 6.7 mg/dL — ABNORMAL HIGH (ref 2.5–4.6)
Potassium: 4.8 mmol/L (ref 3.5–5.1)
Sodium: 137 mmol/L (ref 135–145)

## 2019-08-18 LAB — GLUCOSE, CAPILLARY: Glucose-Capillary: 80 mg/dL (ref 70–99)

## 2019-08-18 LAB — MAGNESIUM: Magnesium: 1.4 mg/dL — ABNORMAL LOW (ref 1.7–2.4)

## 2019-08-18 LAB — SARS CORONAVIRUS 2 (TAT 6-24 HRS): SARS Coronavirus 2: NEGATIVE

## 2019-08-18 LAB — MRSA PCR SCREENING: MRSA by PCR: NEGATIVE

## 2019-08-18 NOTE — Plan of Care (Signed)

## 2019-08-18 NOTE — ED Notes (Signed)
Bed changed at [name]. Report called

## 2019-08-18 NOTE — Evaluation (Signed)
Physical Therapy Evaluation Patient Details Name: ABDULAHI Hahn MRN: [id number] DOB: 14-Apr-1941 Today's Date: 08/18/2019   History of Present Illness  79 yo male admitted to ED on 3/2 for LE weakness, AKI. PMH includes CKD IV, anxiety, AICD, CHF with EF 20-25%, DM, gout, HLD, HTN.  Clinical Impression   Pt presents with generalized weakness, unsteadiness in standing, and decreased activity tolerance. Pt to benefit from acute PT to address deficits. Pt ambulated hallway distance with RW and cuing for safe use of RW, at baseline pt uses no AD and is independent. PT recommending HHPT with increased support from pt's children, particularly his sons that live locally. PT to progress mobility as tolerated, and will continue to follow acutely.      Follow Up Recommendations Home health PT;Supervision for mobility/OOB    Equipment Recommendations  None recommended by PT    Recommendations for Other Services       Precautions / Restrictions Precautions Precautions: Fall;ICD/Pacemaker Restrictions Weight Bearing Restrictions: No      Mobility  Bed Mobility Overal bed mobility: Needs Assistance Bed Mobility: Supine to Sit     Supine to sit: HOB elevated;Supervision     General bed mobility comments: supervision for safety, pt with use of bed rails and increased time to perform.  Transfers Overall transfer level: Needs assistance Equipment used: 1 person hand held assist Transfers: Sit to/from Stand Sit to Stand: Min assist;From elevated surface         General transfer comment: Min assist to rise and steady, pt reaching for environment once standing to steady. PT initiated pt use of RW for safety.  Ambulation/Gait Ambulation/Gait assistance: Min guard Gait Distance (Feet): 160 Feet Assistive device: Rolling walker (2 wheeled) Gait Pattern/deviations: Step-through pattern;Decreased stride length;Trunk flexed Gait velocity: decr   General Gait Details: Min guard for  safety, verbal cuing for placement in RW, upright posture. Slow, steady gait.  Stairs            Wheelchair Mobility    Modified Rankin (Stroke Patients Only)       Balance Overall balance assessment: Needs assistance;History of Falls(reports x1 fall in the past year) Sitting-balance support: No upper extremity supported;Feet supported Sitting balance-Leahy Scale: Good     Standing balance support: Bilateral upper extremity supported Standing balance-Leahy Scale: Poor Standing balance comment: reliant on external support via PT and RW                             Pertinent Vitals/Pain Pain Assessment: No/denies pain    Home Living Family/patient expects to be discharged to:: Private residence Living Arrangements: Alone Available Help at Discharge: Family;Available PRN/intermittently Type of Home: Apartment Home Access: Ramped entrance     Home Layout: One level Home Equipment: Walker - 2 wheels;Cane - single point      Prior Function Level of Independence: Independent with assistive device(s)         Comments: pt reports occasionally using cane for ambulation when he is having a gout flareup. Pt states he usually loves to cook, but has not since his wife passed away 2 weeks ago.     Hand Dominance   Dominant Hand: Right    Extremity/Trunk Assessment   Upper Extremity Assessment Upper Extremity Assessment: Defer to OT evaluation    Lower Extremity Assessment Lower Extremity Assessment: Generalized weakness(at least 3/5 hip flexion, knee flexion/extension, hip abd/add)    Cervical / Trunk Assessment Cervical / Trunk  Assessment: Normal  Communication   Communication: No difficulties  Cognition Arousal/Alertness: Awake/alert Behavior During Therapy: WFL for tasks assessed/performed Overall Cognitive Status: Within Functional Limits for tasks assessed                                 General Comments: Pt appears down, with  slightly flat affect but is pleasant. Pt reports being disinterested in daily tasks, and has been neglecting basic needs like eating and bathing, since wife died.      General Comments General comments (skin integrity, edema, etc.): VSS, SpO2 with inaccurate read    Exercises     Assessment/Plan    PT Assessment Patient needs continued PT services  PT Problem List Decreased strength;Decreased mobility;Decreased activity tolerance;Decreased balance;Decreased knowledge of use of DME       PT Treatment Interventions DME instruction;Therapeutic activities;Gait training;Therapeutic exercise;Patient/family education;Balance training;Functional mobility training;Neuromuscular re-education    PT Goals (Current goals can be found in the Care Plan section)  Acute Rehab PT Goals Patient Stated Goal: go home PT Goal Formulation: With patient Time For Goal Achievement: 09/01/19 Potential to Achieve Goals: Good    Frequency Min 3X/week   Barriers to discharge        Co-evaluation               AM-PAC PT "6 Clicks" Mobility  Outcome Measure Help needed turning from your back to your side while in a flat bed without using bedrails?: None Help needed moving from lying on your back to sitting on the side of a flat bed without using bedrails?: None Help needed moving to and from a bed to a chair (including a wheelchair)?: A Little Help needed standing up from a chair using your arms (Hahn.g., wheelchair or bedside chair)?: A Little Help needed to walk in hospital room?: A Little Help needed climbing 3-5 steps with a railing? : A Little 6 Click Score: 20    End of Session Equipment Utilized During Treatment: Gait belt Activity Tolerance: Patient tolerated treatment well;Patient limited by fatigue Patient left: in chair;with call bell/phone within reach;with chair alarm set Nurse Communication: Mobility status;Other (comment)(pt room missing posey box, chair alarm placed and pt states he  will not mobilize back to bed without assist) PT Visit Diagnosis: Unsteadiness on feet (R26.81);Muscle weakness (generalized) (M62.81)    Time: 3810-1751 PT Time Calculation (min) (ACUTE ONLY): 25 min   Charges:   PT Evaluation $PT Eval Low Complexity: 1 Low PT Treatments $Gait Training: 8-22 mins       Tyler Hahn, PT Acute Rehabilitation Services Pager [phone]  Office [phone]  Tyler Hahn 08/18/2019, 3:58 PM

## 2019-08-18 NOTE — Progress Notes (Signed)
PROGRESS NOTE    Tyler Hahn  BZJ:[id number] DOB: June 30, 1940 DOA: 08/17/2019 PCP: [name], MD     Brief Narrative:  79 year old man admitted from home on 3/2 after being directed here by his PCP due to abnormal labs.  His past medical history significant for stage IV chronic kidney disease with a baseline creatinine between 3.4 and 3.7, chronic systolic heart failure with an ejection fraction of 20 to 25% status post AICD placement, hypothyroidism, anemia of chronic disease with a baseline hemoglobin between 9 and 11.  He has been complaining of generalized weakness for the past 2 to 3 days, his wife recently passed away 2 weeks ago.  He has been having some depressed mood which has led to decreased oral intake.  He had labs drawn by his PCP and was asked to come to the hospital due to worsening renal function and hyperkalemia.   Assessment & Plan:   Principal Problem:   Acute renal failure (ARF) (HCC) Active Problems:   SYSTOLIC HEART FAILURE, CHRONIC   Hypothyroid   Hyperkalemia   History of anemia due to CKD   Acute on chronic kidney disease stage IV -Baseline creatinine between 3.5 and 3.7. -Creatinine on admission was 6.54, slight decreased to 6.4 today. -Nephrology is on board. -They believe there is a component of bladder outlet obstruction given bladder scan with 675 cc.  Nevertheless, renal ultrasound is normal. -Continue holding nephrotoxic agents including lisinopril and torsemide as well as any NSAIDs. -Patient states he has been voiding spontaneously today. -He has had a little over 1000 cc of urine output since admission, he does not currently have a Foley catheter. -Patient would not want to go on hemodialysis, no urgent need for hemodialysis.  Hyperkalemia -With no EKG changes on admission. -He was treated with insulin/D5, Lokelma, bicarbonate. -Potassium is down to 4.8 today.  Anemia of chronic disease -Due to advanced renal  dysfunction. -Hemoglobin is stable in the upper 9 range.  Generalized weakness -Likely due to acute on chronic kidney disease. -Recent TSH was normal at 2.91. -There is no evidence of an acute infectious process at this time. -He remains on Zoloft for his depression. -Obtain PT/OT consultations.  Hypothyroidism -TSH is normal, continue home dose of Synthroid.  Chronic systolic heart failure -With the most recent echo from April 2014 at which time ejection fraction was 20 to 25% -This appears stable and compensated at this time. -Outpatient medications include Coreg, lisinopril and torsemide.    DVT prophylaxis: SCDs Code Status: Full code Family Communication: patient only Disposition Plan: transfer to telemetry. Await improvement in renal function to determine if dialysis will be needed prior to DC.  Consultants:   Nephrology  Procedures:   None  Antimicrobials:  Anti-infectives (From admission, onward)   None       Subjective: Sitting in chair at bedside. Feels well other than a little weak.  Objective: Vitals:   08/18/19 0050 08/18/19 0124 08/18/19 0735 08/18/19 1115  BP:  (!) 151/72 139/64 132/64  Pulse:  77 66 66  Resp:  11 17 16   Temp: 98 F (36.7 C) (!) 97.3 F (36.3 C) 97.7 F (36.5 C) 97.8 F (36.6 C)  TempSrc:  Oral Oral Oral  SpO2:  100% 100% 100%  Weight:  72.1 kg    Height:  5\' 8"  (1.727 m)      Intake/Output Summary (Last 24 hours) at 08/18/2019 1348 Last data filed at 08/18/2019 0930 Gross per 24 hour  Intake --  Output 1025 ml  Net -1025 ml   Filed Weights   08/17/19 1635 08/18/19 0124  Weight: 74.8 kg 72.1 kg    Examination:  General exam: Alert, awake, oriented x 3 Respiratory system: Clear to auscultation. Respiratory effort normal. Cardiovascular system:RRR. No murmurs, rubs, gallops. Gastrointestinal system: Abdomen is nondistended, soft and nontender. No organomegaly or masses felt. Normal bowel sounds heard. Central  nervous system: Alert and oriented. No focal neurological deficits. Extremities: No C/C/E, +pedal pulses Skin: No rashes, lesions or ulcers Psychiatry: Judgement and insight appear normal. Mood & affect appropriate.     Data Reviewed: I have personally reviewed following labs and imaging studies  CBC: Recent Labs  Lab 08/16/19 1424 08/17/19 1633 08/18/19 0725  WBC 5.0 5.6 5.3  NEUTROABS 3.1 3.5  --   HGB 9.5* 9.4* 9.9*  HCT 29.3* 29.9* 30.7*  MCV 86.4 86.9 85.5  PLT 128.0* 112* 818*   Basic Metabolic Panel: Recent Labs  Lab 08/16/19 1424 08/17/19 1633 08/17/19 2030 08/18/19 0725  NA 137 136 138 137  K 5.6* 6.0* 5.2* 4.8  CL 104 105 107 105  CO2 20 20* 19* 18*  GLUCOSE 83 80 50* 97  BUN 119* 131* 137* 131*  CREATININE 6.52* 6.70* 6.54* 6.49*  CALCIUM 10.5 9.8 10.0 9.8  MG  --   --  1.6* 1.4*  PHOS  --   --   --  6.7*   GFR: Estimated Creatinine Clearance: 9.2 mL/min (A) (by C-G formula based on SCr of 6.49 mg/dL (H)). Liver Function Tests: Recent Labs  Lab 08/16/19 1424 08/17/19 1633 08/18/19 0725  AST 11 18  --   ALT 11 15  --   ALKPHOS 51 49  --   BILITOT 0.3 0.6  --   PROT 7.6 8.1  --   ALBUMIN 4.3 4.2 3.8   No results for input(s): LIPASE, AMYLASE in the last 168 hours. No results for input(s): AMMONIA in the last 168 hours. Coagulation Profile: No results for input(s): INR, PROTIME in the last 168 hours. Cardiac Enzymes: No results for input(s): CKTOTAL, CKMB, CKMBINDEX, TROPONINI in the last 168 hours. BNP (last 3 results) No results for input(s): PROBNP in the last 8760 hours. HbA1C: Recent Labs    08/16/19 1424  HGBA1C 6.8*   CBG: Recent Labs  Lab 08/18/19 0858  GLUCAP 80   Lipid Profile: Recent Labs    08/16/19 1424  CHOL 146  HDL 39.70  LDLCALC 79  TRIG 136.0  CHOLHDL 4   Thyroid Function Tests: Recent Labs    08/16/19 1424  TSH 2.91   Anemia Panel: Recent Labs    08/17/19 2033  FERRITIN 244  TIBC 311  IRON 97    Urine analysis:    Component Value Date/Time   COLORURINE STRAW (A) 08/17/2019 1847   APPEARANCEUR CLEAR 08/17/2019 1847   LABSPEC 1.006 08/17/2019 1847   PHURINE 5.0 08/17/2019 1847   GLUCOSEU NEGATIVE 08/17/2019 1847   HGBUR NEGATIVE 08/17/2019 1847   BILIRUBINUR NEGATIVE 08/17/2019 1847   BILIRUBINUR negative 07/28/2014 1419   KETONESUR NEGATIVE 08/17/2019 1847   PROTEINUR NEGATIVE 08/17/2019 1847   UROBILINOGEN 0.2 07/28/2014 1419   NITRITE NEGATIVE 08/17/2019 1847   LEUKOCYTESUR NEGATIVE 08/17/2019 1847   Sepsis Labs: @LABRCNTIP (procalcitonin:4,lacticidven:4)  ) Recent Results (from the past 240 hour(s))  SARS CORONAVIRUS 2 (TAT 6-24 HRS) Nasopharyngeal Nasopharyngeal Swab     Status: None   Collection Time: 08/17/19  7:59 PM   Specimen: Nasopharyngeal Swab  Result  Value Ref Range Status   SARS Coronavirus 2 NEGATIVE NEGATIVE Final    Comment: (NOTE) SARS-CoV-2 target nucleic acids are NOT DETECTED. The SARS-CoV-2 RNA is generally detectable in upper and lower respiratory specimens during the acute phase of infection. Negative results do not preclude SARS-CoV-2 infection, do not rule out co-infections with other pathogens, and should not be used as the sole basis for treatment or other patient management decisions. Negative results must be combined with clinical observations, patient history, and epidemiological information. The expected result is Negative. Fact Sheet for Patients: SugarRoll.be Fact Sheet for Healthcare Providers: https://www.woods-mathews.com/ This test is not yet approved or cleared by the [place] FDA and  has been authorized for detection and/or diagnosis of SARS-CoV-2 by FDA under an Emergency Use Authorization (EUA). This EUA will remain  in effect (meaning this test can be used) for the duration of the COVID-19 declaration under Section 56 4(b)(1) of the Act, 21 U.S.C. section 360bbb-3(b)(1),  unless the authorization is terminated or revoked sooner. Performed at [facility], [place] [address] [place], [place] [address]   MRSA PCR Screening     Status: None   Collection Time: 08/18/19  1:27 AM   Specimen: Nasal Mucosa; Nasopharyngeal  Result Value Ref Range Status   MRSA by PCR NEGATIVE NEGATIVE Final    Comment:        The GeneXpert MRSA Assay (FDA approved for NASAL specimens only), is one component of a comprehensive MRSA colonization surveillance program. It is not intended to diagnose MRSA infection nor to guide or monitor treatment for MRSA infections. Performed at [facility], [place] [address] [place], [place] [address]          Radiology Studies: US Renal  Result Date: 08/17/2019 CLINICAL DATA:  Acute kidney injury EXAM: RENAL / URINARY TRACT ULTRASOUND COMPLETE COMPARISON:  Ultrasound 08/08/2014, CT abdomen pelvis 07/16/2013 FINDINGS: Right Kidney: Renal measurements: 7.6 x 4.6 x 4.0 cm = volume: 73.6 mL. Normal echogenicity. Few anechoic simple appearing thin walled cysts in the right kidney. Largest in the interpolar kidney measuring 2.4 x 2.2 x 1.5 cm. Smaller cysts measuring up to 1.5 and 1.0 cm are also visualized in the interpolar right kidney. No worrisome renal lesion. No hydronephrosis or shadowing calculus. Left Kidney: Renal measurements: 7.9 x 4.7 x 5.0 cm = volume: 98.3 mL. Normal echogenicity. Few anechoic simple appearing, thin walled cyst present in the left kidney as well. Largest cyst in the lower pole measuring 2.1 x 2.1 x 1.9 cm. Additional 2.1 and 1.8 cm cysts are noted in the upper and interpolar kidney as well. No worrisome renal mass. No hydronephrosis or shadowing calculus. Bladder: Appears normal for degree of bladder distention. Bilateral bladder jets are identified. Other: Technically difficult exam given limited sonographic windows and bowel gas. IMPRESSION: Technically difficult exam due to limited sonographic windows and  extensive bowel gas. Bilateral simple appearing renal cysts are present. Otherwise grossly unremarkable renal ultrasound. Electronically Signed   By: Lovena Le M.D.   On: 08/17/2019 21:25        Scheduled Meds: . albuterol  10 mg Nebulization Once  . atorvastatin  20 mg Oral Daily  . carvedilol  12.5 mg Oral BID  . levothyroxine  25 mcg Oral QAC breakfast  . sodium chloride flush  3 mL Intravenous Q12H   Continuous Infusions: . dextrose       LOS: 1 day    Time spent: 35 minutes.. Greater than 50% of  this time was spent in direct contact with the patient, coordinating care and discussing relevant ongoing clinical issues.     Lelon Frohlich, MD [facility] Pager [phone]  If 7PM-7AM, please contact night-coverage [email or web address] Password [email or web address] 08/18/2019, 1:48 PM

## 2019-08-18 NOTE — Progress Notes (Signed)
[name]   79 y.o. male HTN, DM, HLD, NICM s/p ICD placement, and CKD stage IV (followed by Dr. Joelyn Oms in our office) who was seen for routine follow up by his PCP yesterday and noted to have AKI and hyperkalemia.  He was instructed to to the ED to be further evaluated.  In the ED today, his labs were rechecked with worsening renal function, azotemia, and hyperkalemia.  He did lose his wife of over 50 years several weeks ago (she was on dialysis for a short time but did not do well).  He denies any NSAIDs or COX-II I's but has been on lisinopril and torsemide prior to admission.    Assessment/ Plan:   1. AKI/CKD stage IV- suspect bladder outlet obstruction given palpable suprapubic mass and bladder scan with 675 ml.  Renal US pending.   1. Continue holding lisinopril and torsemide for now 2. No uremic symptoms so no urgent need for dialysis.  3. Noted to stand and void 579mL yest in ED and has already had 1025 ml UOP /24hrs. 4. Bladder scan post void check qshift and if over 300 ml then place foley. Would like to continue trending and monitoring response to therapy before committing to RRT.  2. Hyperkalemia- no ECG changes.  Pt treated in the ED with bicarb, insulin/D5, lokelma but will be helped most with relief of bladder outlet obstruction and foley catheter placement. 3. Anemia of CKD stage IV- will check iron stores but will likely need to start ESA therapy 4. HTN- stable and follow off of lisinopril.  May need to start hydralazine or amlodipine if BP rises 5. DM- per primary 6. NICMP s/p AICD- stable 7. CKD stage IV- felt to be due to DM and followed closely by Dr. Joelyn Oms.  He is leaning towards PD if he needs to start dialysis but has agreed to backup avf placement. 8. Thrombocytopenia- continue to follow 9. Right low back pain- possibly related to bladder outlet obstruction.  Will add urine culture and await renal US.   Subjective:   Feeling well and  denies f/c/n/v/ dyspnea/ CP.   Objective:   BP 132/64 (BP Location: Left Arm)   Pulse 66   Temp 97.8 F (36.6 C) (Oral)   Resp 16   Ht 5\' 8"  (1.727 m)   Wt 72.1 kg   SpO2 100%   BMI 24.17 kg/m   Intake/Output Summary (Last 24 hours) at 08/18/2019 1216 Last data filed at 08/18/2019 0930 Gross per 24 hour  Intake --  Output 1025 ml  Net -1025 ml   Weight change:   Physical Exam: General appearance: NAD, NCAT Resp: clear to auscultation bilaterally with no rales noted Cardio: regular rate and rhythm, no rub and III/VI SEM around precordium GI: soft, nontender, +BS Extremities: extremities normal, atraumatic, no cyanosis or edema Neuro: no asterixis  Imaging: US Renal  Result Date: 08/17/2019 CLINICAL DATA:  Acute kidney injury EXAM: RENAL / URINARY TRACT ULTRASOUND COMPLETE COMPARISON:  Ultrasound 08/08/2014, CT abdomen pelvis 07/16/2013 FINDINGS: Right Kidney: Renal measurements: 7.6 x 4.6 x 4.0 cm = volume: 73.6 mL. Normal echogenicity. Few anechoic simple appearing thin walled cysts in the right kidney. Largest in the interpolar kidney measuring 2.4 x 2.2 x 1.5 cm. Smaller cysts measuring up to 1.5 and 1.0 cm are also visualized in the interpolar right kidney. No worrisome renal lesion. No hydronephrosis or shadowing calculus. Left Kidney: Renal measurements: 7.9 x 4.7 x 5.0 cm = volume: 98.3  mL. Normal echogenicity. Few anechoic simple appearing, thin walled cyst present in the left kidney as well. Largest cyst in the lower pole measuring 2.1 x 2.1 x 1.9 cm. Additional 2.1 and 1.8 cm cysts are noted in the upper and interpolar kidney as well. No worrisome renal mass. No hydronephrosis or shadowing calculus. Bladder: Appears normal for degree of bladder distention. Bilateral bladder jets are identified. Other: Technically difficult exam given limited sonographic windows and bowel gas. IMPRESSION: Technically difficult exam due to limited sonographic windows and extensive bowel gas.  Bilateral simple appearing renal cysts are present. Otherwise grossly unremarkable renal ultrasound. Electronically Signed   By: Lovena Le M.D.   On: 08/17/2019 21:25    Labs: BMET Recent Labs  Lab 08/16/19 1424 08/17/19 1633 08/17/19 2030 08/18/19 0725  NA 137 136 138 137  K 5.6* 6.0* 5.2* 4.8  CL 104 105 107 105  CO2 20 20* 19* 18*  GLUCOSE 83 80 50* 97  BUN 119* 131* 137* 131*  CREATININE 6.52* 6.70* 6.54* 6.49*  CALCIUM 10.5 9.8 10.0 9.8  PHOS  --   --   --  6.7*   CBC Recent Labs  Lab 08/16/19 1424 08/17/19 1633 08/18/19 0725  WBC 5.0 5.6 5.3  NEUTROABS 3.1 3.5  --   HGB 9.5* 9.4* 9.9*  HCT 29.3* 29.9* 30.7*  MCV 86.4 86.9 85.5  PLT 128.0* 112* 108*    Medications:    . albuterol  10 mg Nebulization Once  . atorvastatin  20 mg Oral Daily  . carvedilol  12.5 mg Oral BID  . levothyroxine  25 mcg Oral QAC breakfast  . sodium chloride flush  3 mL Intravenous Q12H      Otelia Santee, MD 08/18/2019, 12:16 PM

## 2019-08-18 NOTE — ED Notes (Signed)
Pt is a/o vss in no acute distress. Report given to [facility]. Pt stable for transport to [place]

## 2019-08-18 NOTE — Progress Notes (Signed)
Chaplain engaged in initial visit with Mr. Near.  During visit Mr. Mineer voiced that his wife had passed two weeks ago.  He also expressed feeling some confusion and anxiety around choosing to take dialysis because of how he has seen dialysis impact his father and his wife.  Chaplain affirmed the trauma and anxiety he may be experiencing around taking dialysis considering the experiences of those closest to him.  Chaplain listened as Mr. Spadaccini gave an account of his life and affirmed and uplifted his experiences and growth along the way.  Mr. Caraway seems to also worry about who will take care of him once he gets dialysis.  Chaplain and Mr. Baumler discussed him speaking with family about where he is in his health and what he needs.  Chaplain will continue to follow-up and provide care and support.

## 2019-08-19 ENCOUNTER — Inpatient Hospital Stay (HOSPITAL_COMMUNITY): Payer: Medicare HMO

## 2019-08-19 DIAGNOSIS — N186 End stage renal disease: Secondary | ICD-10-CM

## 2019-08-19 LAB — BASIC METABOLIC PANEL
Anion gap: 18 — ABNORMAL HIGH (ref 5–15)
BUN: 134 mg/dL — ABNORMAL HIGH (ref 8–23)
CO2: 18 mmol/L — ABNORMAL LOW (ref 22–32)
Calcium: 9.9 mg/dL (ref 8.9–10.3)
Chloride: 102 mmol/L (ref 98–111)
Creatinine, Ser: 6.49 mg/dL — ABNORMAL HIGH (ref 0.61–1.24)
GFR calc Af Amer: 9 mL/min — ABNORMAL LOW (ref >60)
GFR calc non Af Amer: 8 mL/min — ABNORMAL LOW (ref >60)
Glucose, Bld: 155 mg/dL — ABNORMAL HIGH (ref 70–99)
Potassium: 4.9 mmol/L (ref 3.5–5.1)
Sodium: 138 mmol/L (ref 135–145)

## 2019-08-19 LAB — CBC
HCT: 31.1 % — ABNORMAL LOW (ref 39.0–52.0)
Hemoglobin: 10 g/dL — ABNORMAL LOW (ref 13.0–17.0)
MCH: 27.8 pg (ref 26.0–34.0)
MCHC: 32.2 g/dL (ref 30.0–36.0)
MCV: 86.4 fL (ref 80.0–100.0)
Platelets: 108 10*3/uL — ABNORMAL LOW (ref 150–400)
RBC: 3.6 MIL/uL — ABNORMAL LOW (ref 4.22–5.81)
RDW: 14.6 % (ref 11.5–15.5)
WBC: 5.1 10*3/uL (ref 4.0–10.5)
nRBC: 0 % (ref 0.0–0.2)

## 2019-08-19 LAB — IMMUNOFIXATION, URINE

## 2019-08-19 MED ORDER — SODIUM BICARBONATE 650 MG PO TABS
650.0000 mg | ORAL_TABLET | Freq: Two times a day (BID) | ORAL | Status: DC
  Administered 2019-08-19 – 2019-08-20 (×3): 650 mg via ORAL
  Filled 2019-08-19 (×3): qty 1

## 2019-08-19 NOTE — Plan of Care (Signed)
  Problem: Clinical Measurements: Goal: Ability to maintain clinical measurements within normal limits will improve Outcome: Progressing   

## 2019-08-19 NOTE — Progress Notes (Signed)
Physical Therapy Treatment Patient Details Name: Tyler Hahn MRN: [id number] DOB: 1940-06-20 Today's Date: 08/19/2019    History of Present Illness 79 yo male admitted to ED on 3/2 for LE weakness, AKI. PMH includes CKD IV, anxiety, AICD, CHF with EF 20-25%, DM, gout, HLD, HTN.    PT Comments    Pt tolerated treatment well with much improved ambulation and transfer quality. Pt is asymptomatic durin session, conversing throughout activity without signs of increased work of breathing. Pt does remain confused, he requires re-orientation to place and situation. Refer to OT notes for further detail on impaired cognitive status. PT continues to recommend discharge to home at [facility], as long as patient is able to have increased supervision from staff and family. PT believes the patient to be at an increased risk of wandering at this time due to AMS.  Follow Up Recommendations  Home health PT;Supervision for mobility/OOB     Equipment Recommendations  None recommended by PT    Recommendations for Other Services       Precautions / Restrictions Precautions Precautions: Fall Restrictions Weight Bearing Restrictions: No    Mobility  Bed Mobility               General bed mobility comments: OOB in chair upon entry  Transfers Overall transfer level: Independent Equipment used: None Transfers: Sit to/from Stand Sit to Stand: Supervision         General transfer comment: supervision for safety   Ambulation/Gait Ambulation/Gait assistance: Supervision Gait Distance (Feet): 400 Feet Assistive device: None Gait Pattern/deviations: Step-to pattern Gait velocity: reduced Gait velocity interpretation: 1.31 - 2.62 ft/sec, indicative of limited community ambulator General Gait Details: pt with short step to gait reduced gait speed and step length   Stairs             Wheelchair Mobility    Modified Rankin (Stroke Patients Only)       Balance Overall  balance assessment: Needs assistance Sitting-balance support: No upper extremity supported;Feet supported Sitting balance-Leahy Scale: Good     Standing balance support: No upper extremity supported;During functional activity Standing balance-Leahy Scale: Good Standing balance comment: supervision                            Cognition Arousal/Alertness: Awake/alert Behavior During Therapy: WFL for tasks assessed/performed Overall Cognitive Status: Impaired/Different from baseline Area of Impairment: Orientation;Memory;Problem solving                 Orientation Level: Disoriented to;Place;Situation Current Attention Level: Sustained Memory: Decreased short-term memory Following Commands: Follows one step commands consistently Safety/Judgement: Decreased awareness of deficits Awareness: Intellectual Problem Solving: Slow processing;Decreased initiation;Difficulty sequencing;Requires verbal cues;Requires tactile cues General Comments: pt with flat affect, but pleasant and cooperative. Oriented to self only, reports he is [place] (and RN reports this morning pt believed he was a visitor).  He is unable to recall 3 words after 1 minute, requires cueing for simple self care tasks (where to spit after brushing his teeth).        Exercises      General Comments General comments (skin integrity, edema, etc.): VSS on RA, pt in NAD      Pertinent Vitals/Pain Pain Assessment: Faces Faces Pain Scale: Hurts little more Pain Location: knees Pain Descriptors / Indicators: Aching Pain Intervention(s): Limited activity within patient's tolerance    Home Living Family/patient expects to be discharged to:: Private residence Living Arrangements: Alone Available  Help at Discharge: Family;Available PRN/intermittently Type of Home: Apartment Home Access: Ramped entrance   Home Layout: One level Home Equipment: Walker - 2 wheels;Cane - single point;Shower seat;Grab bars -  tub/shower Additional Comments: reports independent living facility     Prior Function Level of Independence: Independent with assistive device(s)      Comments: pt reports occasionally using cane, RW for ambulation when he is having a gout flareup. Pt states he usually loves to cook, but has not since his wife passed away 2 weeks ago. Independent with ADLs, IADls and driving.   PT Goals (current goals can now be found in the care plan section) Acute Rehab PT Goals Patient Stated Goal: go home Progress towards PT goals: Progressing toward goals    Frequency    Min 3X/week      PT Plan Current plan remains appropriate    Co-evaluation              AM-PAC PT "6 Clicks" Mobility   Outcome Measure  Help needed turning from your back to your side while in a flat bed without using bedrails?: None Help needed moving from lying on your back to sitting on the side of a flat bed without using bedrails?: None Help needed moving to and from a bed to a chair (including a wheelchair)?: None Help needed standing up from a chair using your arms (e.g., wheelchair or bedside chair)?: None Help needed to walk in hospital room?: None Help needed climbing 3-5 steps with a railing? : A Little 6 Click Score: 23    End of Session   Activity Tolerance: Patient tolerated treatment well Patient left: in chair;with call bell/phone within reach Nurse Communication: Mobility status PT Visit Diagnosis: Unsteadiness on feet (R26.81);Muscle weakness (generalized) (M62.81)     Time: 0076-2263 PT Time Calculation (min) (ACUTE ONLY): 12 min  Charges:  $Gait Training: 8-22 mins                     Zenaida Niece, PT, DPT Acute Rehabilitation Pager: [phone]    Zenaida Niece 08/19/2019, 10:26 AM

## 2019-08-19 NOTE — Progress Notes (Signed)
Upper extremity vein mapping exam completed.  Preliminary results given to [facility], RN.  Preliminary results can be found under CV proc under chart review.  08/19/2019 4:28 PM  Arnaldo Heffron, K., RDMS, RVT

## 2019-08-19 NOTE — Progress Notes (Signed)
[facility] Progress Note   79 y.o.male HTN, DM, HLD, NICM s/p ICD placement, and CKD stage IV (followed by Dr. Joelyn Oms in our office) who was seen for routine follow up by his PCP yesterday and noted to have AKI and hyperkalemia. He was instructed to to the ED to be further evaluated. In the ED today, his labs were rechecked with worsening renal function, azotemia, and hyperkalemia. He did lose his wife of over 50 years several weeks ago (she was on dialysis for a short time but did not do well). He denies any NSAIDs or COX-II I's but has been on lisinopril and torsemide prior to admission.  Assessment/ Plan:   1. AKI/CKD stage IV- suspect bladder outlet obstruction given palpable suprapubic mass and bladder scan with 675 ml. Renal US pending.  1. Continue holding lisinopril and torsemide for now. 2. No uremic symptoms so no urgent need for dialysis; fortunately renal function looks stable on this am's BMET .  3. Noted to stand and void 553mL  in ED and I suspect not all the UOP is being recorded. 4. Bladder scan post void check qshift and if over 300 ml then place foley.  5. Start NaHCO3 650mg  BID; he actually looks on the dry side so I would continue holding the torsemide for now. 2. Hyperkalemia- no ECG changes. Pt treated in the ED with bicarb, insulin/D5, lokelma but will be helped most with relief of bladder outlet obstruction and foley catheter placement. 3. Anemia of CKD stage IV- will check iron stores but will likely need to start ESA therapy 4. HTN- stable and follow off of lisinopril. May need to start hydralazine or amlodipine if BP rises. We can always rechallenge with Lisinopril at outpt clinic ([facility]) if he returns to where he was before.  5. DM- per primary 6. NICMP s/p AICD- stable 7. CKD stage IV- felt to be due to DM and followed closely by Dr. Joelyn Oms. He is leaning towards PD if he needs to start dialysis but has agreed to backup avf  placement. 8. Thrombocytopenia- continue to follow 9. Right low back pain- possibly related to bladder outlet obstruction. Will add urine culture and await renal US.   Subjective:   Feeling well and denies f/c/n/v/ dyspnea/ CP.   Objective:   BP 126/65 (BP Location: Right Arm)   Pulse 75   Temp (!) 97.4 F (36.3 C) (Oral)   Resp 16   Ht 5\' 8"  (1.727 m)   Wt 72.1 kg   SpO2 100%   BMI 24.17 kg/m  No intake or output data in the 24 hours ending 08/19/19 1020 Weight change:   Physical Exam: General appearance:NAD, NCAT Resp:clear to auscultation bilaterally with no rales noted Cardio:regular rate and rhythm, no rub andIII/VI SEM around precordium PZ:WCHE, nontender, +BS Extremities:extremities normal, atraumatic, no cyanosis or edema Neuro: no asterixis  Imaging: US Renal  Result Date: 08/17/2019 CLINICAL DATA:  Acute kidney injury EXAM: RENAL / URINARY TRACT ULTRASOUND COMPLETE COMPARISON:  Ultrasound 08/08/2014, CT abdomen pelvis 07/16/2013 FINDINGS: Right Kidney: Renal measurements: 7.6 x 4.6 x 4.0 cm = volume: 73.6 mL. Normal echogenicity. Few anechoic simple appearing thin walled cysts in the right kidney. Largest in the interpolar kidney measuring 2.4 x 2.2 x 1.5 cm. Smaller cysts measuring up to 1.5 and 1.0 cm are also visualized in the interpolar right kidney. No worrisome renal lesion. No hydronephrosis or shadowing calculus. Left Kidney: Renal measurements: 7.9 x 4.7 x 5.0 cm = volume: 98.3  mL. Normal echogenicity. Few anechoic simple appearing, thin walled cyst present in the left kidney as well. Largest cyst in the lower pole measuring 2.1 x 2.1 x 1.9 cm. Additional 2.1 and 1.8 cm cysts are noted in the upper and interpolar kidney as well. No worrisome renal mass. No hydronephrosis or shadowing calculus. Bladder: Appears normal for degree of bladder distention. Bilateral bladder jets are identified. Other: Technically difficult exam given limited sonographic windows and  bowel gas. IMPRESSION: Technically difficult exam due to limited sonographic windows and extensive bowel gas. Bilateral simple appearing renal cysts are present. Otherwise grossly unremarkable renal ultrasound. Electronically Signed   By: Lovena Le M.D.   On: 08/17/2019 21:25    Labs: BMET Recent Labs  Lab 08/16/19 1424 08/17/19 1633 08/17/19 2030 08/18/19 0725 08/19/19 0534  NA 137 136 138 137 138  K 5.6* 6.0* 5.2* 4.8 4.9  CL 104 105 107 105 102  CO2 20 20* 19* 18* 18*  GLUCOSE 83 80 50* 97 155*  BUN 119* 131* 137* 131* 134*  CREATININE 6.52* 6.70* 6.54* 6.49* 6.49*  CALCIUM 10.5 9.8 10.0 9.8 9.9  PHOS  --   --   --  6.7*  --    CBC Recent Labs  Lab 08/16/19 1424 08/17/19 1633 08/18/19 0725 08/19/19 0534  WBC 5.0 5.6 5.3 5.1  NEUTROABS 3.1 3.5  --   --   HGB 9.5* 9.4* 9.9* 10.0*  HCT 29.3* 29.9* 30.7* 31.1*  MCV 86.4 86.9 85.5 86.4  PLT 128.0* 112* 108* 108*    Medications:    . albuterol  10 mg Nebulization Once  . atorvastatin  20 mg Oral Daily  . carvedilol  12.5 mg Oral BID  . levothyroxine  25 mcg Oral QAC breakfast  . sodium chloride flush  3 mL Intravenous Q12H      Otelia Santee, MD 08/19/2019, 10:20 AM

## 2019-08-19 NOTE — Progress Notes (Signed)
PROGRESS NOTE    Tyler Hahn  JSE:[id number] DOB: 1940-10-12 DOA: 08/17/2019 PCP: [name], MD     Brief Narrative:  79 year old man admitted from home on 3/2 after being directed here by his PCP due to abnormal labs.  His past medical history significant for stage IV chronic kidney disease with a baseline creatinine between 3.4 and 3.7, chronic systolic heart failure with an ejection fraction of 20 to 25% status post AICD placement, hypothyroidism, anemia of chronic disease with a baseline hemoglobin between 9 and 11.  He has been complaining of generalized weakness for the past 2 to 3 days, his wife recently passed away 2 weeks ago.  He has been having some depressed mood which has led to decreased oral intake.  He had labs drawn by his PCP and was asked to come to the hospital due to worsening renal function and hyperkalemia.   Assessment & Plan:   Principal Problem:   Acute renal failure (ARF) (HCC) Active Problems:   SYSTOLIC HEART FAILURE, CHRONIC   Hypothyroid   Hyperkalemia   History of anemia due to CKD   Acute on chronic kidney disease stage IV -Baseline creatinine between 3.5 and 3.7. -Creatinine on admission was 6.54, slight decreased to 6.49 3/3, 6.49 3/4. -Nephrology is on board. -They believe there is a component of bladder outlet obstruction   Nevertheless, renal ultrasound is normal. -Continue holding nephrotoxic agents including lisinopril and torsemide as well as any NSAIDs. -Patient states he has been voiding spontaneously today. No UOP is documented from overnight. -Patient would not want to go on hemodialysis, no urgent need for hemodialysis.  Hyperkalemia -With no EKG changes on admission. -He was treated with insulin/D5, Lokelma, bicarbonate. -Potassium is 4.9 today.  Anemia of chronic disease -Due to advanced renal dysfunction. -Hemoglobin is stable in the upper 9-10 range.  Generalized weakness -Likely due to acute on chronic kidney  disease. -Recent TSH was normal at 2.91. -There is no evidence of an acute infectious process at this time. -He remains on Zoloft for his depression. -Obtain PT/OT consultations.  Hypothyroidism -TSH is normal, continue home dose of Synthroid.  Chronic systolic heart failure -With the most recent echo from April 2014 at which time ejection fraction was 20 to 25% -This appears stable and compensated at this time. -Outpatient medications include Coreg, lisinopril and torsemide.    DVT prophylaxis: SCDs Code Status: Full code Family Communication: patient only Disposition Plan: Await improvement in renal function to determine if dialysis will be needed prior to DC.  Consultants:   Nephrology  Procedures:   None  Antimicrobials:  Anti-infectives (From admission, onward)   None       Subjective: Dozing off in chair at bedside. Awakes quickly to voice. States he feels VERY weak.  Objective: Vitals:   08/18/19 0735 08/18/19 1115 08/18/19 2009 08/18/19 2359  BP: 139/64 132/64 (!) 150/86 135/78  Pulse: 66 66    Resp: 17 16    Temp: 97.7 F (36.5 C) 97.8 F (36.6 C)  98.1 F (36.7 C)  TempSrc: Oral Oral    SpO2: 100% 100%    Weight:      Height:        Intake/Output Summary (Last 24 hours) at 08/19/2019 0837 Last data filed at 08/18/2019 0930 Gross per 24 hour  Intake --  Output 300 ml  Net -300 ml   Filed Weights   08/17/19 1635 08/18/19 0124  Weight: 74.8 kg 72.1 kg    Examination:  General exam: Alert, awake, oriented x 3 Respiratory system: Clear to auscultation. Respiratory effort normal. Cardiovascular system:RRR. No murmurs, rubs, gallops. Gastrointestinal system: Abdomen is nondistended, soft and nontender. No organomegaly or masses felt. Normal bowel sounds heard. Central nervous system: Alert and oriented. No focal neurological deficits. Extremities: 1+ pitting edema bilaterally., +pedal pulses Skin: No rashes, lesions or ulcers Psychiatry:  Judgement and insight appear normal. Mood & affect appropriate.     Data Reviewed: I have personally reviewed following labs and imaging studies  CBC: Recent Labs  Lab 08/16/19 1424 08/17/19 1633 08/18/19 0725 08/19/19 0534  WBC 5.0 5.6 5.3 5.1  NEUTROABS 3.1 3.5  --   --   HGB 9.5* 9.4* 9.9* 10.0*  HCT 29.3* 29.9* 30.7* 31.1*  MCV 86.4 86.9 85.5 86.4  PLT 128.0* 112* 108* 656*   Basic Metabolic Panel: Recent Labs  Lab 08/16/19 1424 08/17/19 1633 08/17/19 2030 08/18/19 0725 08/19/19 0534  NA 137 136 138 137 138  K 5.6* 6.0* 5.2* 4.8 4.9  CL 104 105 107 105 102  CO2 20 20* 19* 18* 18*  GLUCOSE 83 80 50* 97 155*  BUN 119* 131* 137* 131* 134*  CREATININE 6.52* 6.70* 6.54* 6.49* 6.49*  CALCIUM 10.5 9.8 10.0 9.8 9.9  MG  --   --  1.6* 1.4*  --   PHOS  --   --   --  6.7*  --    GFR: Estimated Creatinine Clearance: 9.2 mL/min (A) (by C-G formula based on SCr of 6.49 mg/dL (H)). Liver Function Tests: Recent Labs  Lab 08/16/19 1424 08/17/19 1633 08/18/19 0725  AST 11 18  --   ALT 11 15  --   ALKPHOS 51 49  --   BILITOT 0.3 0.6  --   PROT 7.6 8.1  --   ALBUMIN 4.3 4.2 3.8   No results for input(s): LIPASE, AMYLASE in the last 168 hours. No results for input(s): AMMONIA in the last 168 hours. Coagulation Profile: No results for input(s): INR, PROTIME in the last 168 hours. Cardiac Enzymes: No results for input(s): CKTOTAL, CKMB, CKMBINDEX, TROPONINI in the last 168 hours. BNP (last 3 results) No results for input(s): PROBNP in the last 8760 hours. HbA1C: Recent Labs    08/16/19 1424  HGBA1C 6.8*   CBG: Recent Labs  Lab 08/18/19 0858  GLUCAP 80   Lipid Profile: Recent Labs    08/16/19 1424  CHOL 146  HDL 39.70  LDLCALC 79  TRIG 136.0  CHOLHDL 4   Thyroid Function Tests: Recent Labs    08/16/19 1424  TSH 2.91   Anemia Panel: Recent Labs    08/17/19 2033  FERRITIN 244  TIBC 311  IRON 97   Urine analysis:    Component Value  Date/Time   COLORURINE STRAW (A) 08/17/2019 1847   APPEARANCEUR CLEAR 08/17/2019 1847   LABSPEC 1.006 08/17/2019 1847   PHURINE 5.0 08/17/2019 1847   GLUCOSEU NEGATIVE 08/17/2019 1847   HGBUR NEGATIVE 08/17/2019 1847   BILIRUBINUR NEGATIVE 08/17/2019 1847   BILIRUBINUR negative 07/28/2014 1419   KETONESUR NEGATIVE 08/17/2019 1847   PROTEINUR NEGATIVE 08/17/2019 1847   UROBILINOGEN 0.2 07/28/2014 1419   NITRITE NEGATIVE 08/17/2019 1847   LEUKOCYTESUR NEGATIVE 08/17/2019 1847   Sepsis Labs: @LABRCNTIP (procalcitonin:4,lacticidven:4)  ) Recent Results (from the past 240 hour(s))  SARS CORONAVIRUS 2 (TAT 6-24 HRS) Nasopharyngeal Nasopharyngeal Swab     Status: None   Collection Time: 08/17/19  7:59 PM   Specimen: Nasopharyngeal Swab  Result  Value Ref Range Status   SARS Coronavirus 2 NEGATIVE NEGATIVE Final    Comment: (NOTE) SARS-CoV-2 target nucleic acids are NOT DETECTED. The SARS-CoV-2 RNA is generally detectable in upper and lower respiratory specimens during the acute phase of infection. Negative results do not preclude SARS-CoV-2 infection, do not rule out co-infections with other pathogens, and should not be used as the sole basis for treatment or other patient management decisions. Negative results must be combined with clinical observations, patient history, and epidemiological information. The expected result is Negative. Fact Sheet for Patients: SugarRoll.be Fact Sheet for Healthcare Providers: https://www.woods-mathews.com/ This test is not yet approved or cleared by the [place] FDA and  has been authorized for detection and/or diagnosis of SARS-CoV-2 by FDA under an Emergency Use Authorization (EUA). This EUA will remain  in effect (meaning this test can be used) for the duration of the COVID-19 declaration under Section 56 4(b)(1) of the Act, 21 U.S.C. section 360bbb-3(b)(1), unless the authorization is terminated  or revoked sooner. Performed at [facility], [place] [address] [place], [place] [address]   MRSA PCR Screening     Status: None   Collection Time: 08/18/19  1:27 AM   Specimen: Nasal Mucosa; Nasopharyngeal  Result Value Ref Range Status   MRSA by PCR NEGATIVE NEGATIVE Final    Comment:        The GeneXpert MRSA Assay (FDA approved for NASAL specimens only), is one component of a comprehensive MRSA colonization surveillance program. It is not intended to diagnose MRSA infection nor to guide or monitor treatment for MRSA infections. Performed at [facility], [place] [address], [place], [place] [address]          Radiology Studies: US Renal  Result Date: 08/17/2019 CLINICAL DATA:  Acute kidney injury EXAM: RENAL / URINARY TRACT ULTRASOUND COMPLETE COMPARISON:  Ultrasound 08/08/2014, CT abdomen pelvis 07/16/2013 FINDINGS: Right Kidney: Renal measurements: 7.6 x 4.6 x 4.0 cm = volume: 73.6 mL. Normal echogenicity. Few anechoic simple appearing thin walled cysts in the right kidney. Largest in the interpolar kidney measuring 2.4 x 2.2 x 1.5 cm. Smaller cysts measuring up to 1.5 and 1.0 cm are also visualized in the interpolar right kidney. No worrisome renal lesion. No hydronephrosis or shadowing calculus. Left Kidney: Renal measurements: 7.9 x 4.7 x 5.0 cm = volume: 98.3 mL. Normal echogenicity. Few anechoic simple appearing, thin walled cyst present in the left kidney as well. Largest cyst in the lower pole measuring 2.1 x 2.1 x 1.9 cm. Additional 2.1 and 1.8 cm cysts are noted in the upper and interpolar kidney as well. No worrisome renal mass. No hydronephrosis or shadowing calculus. Bladder: Appears normal for degree of bladder distention. Bilateral bladder jets are identified. Other: Technically difficult exam given limited sonographic windows and bowel gas. IMPRESSION: Technically difficult exam due to limited sonographic windows and extensive bowel gas. Bilateral simple  appearing renal cysts are present. Otherwise grossly unremarkable renal ultrasound. Electronically Signed   By: Lovena Le M.D.   On: 08/17/2019 21:25        Scheduled Meds: . albuterol  10 mg Nebulization Once  . atorvastatin  20 mg Oral Daily  . carvedilol  12.5 mg Oral BID  . levothyroxine  25 mcg Oral QAC breakfast  . sodium chloride flush  3 mL Intravenous Q12H   Continuous Infusions: . dextrose       LOS: 2 days    Time spent: 25 minutes.. Greater than 50% of  this time was spent in direct contact with the patient, coordinating care and discussing relevant ongoing clinical issues.     Lelon Frohlich, MD [facility] Pager [phone]  If 7PM-7AM, please contact night-coverage [email or web address] Password [facility] 08/19/2019, 8:37 AM

## 2019-08-19 NOTE — Evaluation (Signed)
Occupational Therapy Evaluation Patient Details Name: Tyler Hahn MRN: [id number] DOB: [birth year] Today's Date: 08/19/2019    History of Present Illness 79 yo male admitted to ED on 3/2 for LE weakness, AKI. PMH includes CKD IV, anxiety, AICD, CHF with EF 20-25%, DM, gout, HLD, HTN.   Clinical Impression   PTA patient independent and driving. Admitted for above and limited by decreased activity tolerance, generalized weakness, impaired balance, and impaired cognition.  Patient oriented to self only, re-oriented, poor STM, impaired problem solving (required cueing to figure out where to spit after brushing his teeth), decreased awareness.  He requires supervision for ADLs (RN reports pt dressing himself this morning), functional mobility and transfers.  Based on performance today, recommend 24/7 support at discharge --discussed with pt but recommend he have full assist with IADLs (cooking, cleaning, med mgmt) and NO DRIVING. He reports living in an independent living facility, but unsure how much assist he can get at dc. He will benefit from further OT services while admitted and after dc at [facility] level to optimize independence and safety with ADLs, IADLs.     Follow Up Recommendations  Home health OT;Supervision/Assistance - 24 hour    Equipment Recommendations  None recommended by OT    Recommendations for Other Services       Precautions / Restrictions Precautions Precautions: Fall Restrictions Weight Bearing Restrictions: No      Mobility Bed Mobility               General bed mobility comments: OOB in chair upon entry  Transfers Overall transfer level: Needs assistance Equipment used: None Transfers: Sit to/from Stand Sit to Stand: Supervision         General transfer comment: supervision for safety     Balance Overall balance assessment: Needs assistance Sitting-balance support: No upper extremity supported;Feet supported Sitting balance-Leahy Scale:  Good     Standing balance support: No upper extremity supported;During functional activity Standing balance-Leahy Scale: Fair Standing balance comment: no losses of balance, no UE support during ADLs or mobility                           ADL either performed or assessed with clinical judgement   ADL Overall ADL's : Needs assistance/impaired     Grooming: Supervision/safety;Standing Grooming Details (indicate cue type and reason): min cueing for sequencing oral care                  Toilet Transfer: Supervision/safety;Ambulation Toilet Transfer Details (indicate cue type and reason): simulated in room          Functional mobility during ADLs: Supervision/safety General ADL Comments: pt fully dressed upon entry, RN reports pt dressing himself this morning. Limited by cognition.     Vision   Vision Assessment?: No apparent visual deficits     Perception     Praxis      Pertinent Vitals/Pain Pain Assessment: No/denies pain     Hand Dominance Right   Extremity/Trunk Assessment Upper Extremity Assessment Upper Extremity Assessment: Generalized weakness   Lower Extremity Assessment Lower Extremity Assessment: Defer to PT evaluation   Cervical / Trunk Assessment Cervical / Trunk Assessment: Normal   Communication Communication Communication: No difficulties   Cognition Arousal/Alertness: Awake/alert Behavior During Therapy: Flat affect Overall Cognitive Status: Impaired/Different from baseline Area of Impairment: Orientation;Attention;Memory;Following commands;Safety/judgement;Awareness;Problem solving                 Orientation Level: Disoriented  to;Place;Time;Situation Current Attention Level: Sustained Memory: Decreased short-term memory Following Commands: Follows one step commands consistently;Follows one step commands with increased time;Follows multi-step commands inconsistently Safety/Judgement: Decreased awareness of  safety;Decreased awareness of deficits Awareness: Intellectual Problem Solving: Slow processing;Decreased initiation;Difficulty sequencing;Requires verbal cues;Requires tactile cues General Comments: pt with flat affect, but pleasant and cooperative. Oriented to self only, reports he is [place] (and RN reports this morning pt believed he was a visitor).  He is unable to recall 3 words after 1 minute, requires cueing for simple self care tasks (where to spit after brushing his teeth).     General Comments  educated patient on need for increased assist at home, recommending 24/7 assist and NO DRIVING at this time (RN notified of recommendations as well)     Exercises     Shoulder Instructions      Home Living Family/patient expects to be discharged to:: Private residence Living Arrangements: Alone Available Help at Discharge: Family;Available PRN/intermittently Type of Home: Apartment Home Access: Ramped entrance     Home Layout: One level     Bathroom Shower/Tub: Teacher, early years/pre: Standard     Home Equipment: Environmental consultant - 2 wheels;Cane - single point;Shower seat;Grab bars - tub/shower   Additional Comments: reports independent living facility       Prior Functioning/Environment Level of Independence: Independent with assistive device(s)        Comments: pt reports occasionally using cane, RW for ambulation when he is having a gout flareup. Pt states he usually loves to cook, but has not since his wife passed away 2 weeks ago. Independent with ADLs, IADls and driving.        OT Problem List: Impaired balance (sitting and/or standing);Decreased cognition;Decreased safety awareness      OT Treatment/Interventions: Self-care/ADL training;DME and/or AE instruction;Therapeutic activities;Cognitive remediation/compensation;Balance training;Patient/family education;Therapeutic exercise    OT Goals(Current goals can be found in the care plan section) Acute Rehab  OT Goals Patient Stated Goal: go home OT Goal Formulation: With patient Time For Goal Achievement: 09/02/19 Potential to Achieve Goals: Good  OT Frequency: Min 2X/week   Barriers to D/C:            Co-evaluation              AM-PAC OT "6 Clicks" Daily Activity     Outcome Measure Help from another person eating meals?: None Help from another person taking care of personal grooming?: A Little Help from another person toileting, which includes using toliet, bedpan, or urinal?: A Little Help from another person bathing (including washing, rinsing, drying)?: A Little Help from another person to put on and taking off regular upper body clothing?: A Little Help from another person to put on and taking off regular lower body clothing?: A Little 6 Click Score: 19   End of Session Nurse Communication: Mobility status;Other (comment)(cognition)  Activity Tolerance: Patient tolerated treatment well Patient left: in chair;with call bell/phone within reach  OT Visit Diagnosis: Other symptoms and signs involving cognitive function;Muscle weakness (generalized) (M62.81)                Time: 3532-9924 OT Time Calculation (min): 17 min Charges:  OT General Charges $OT Visit: 1 Visit OT Evaluation $OT Eval Moderate Complexity: 1 Mod  Jolaine Artist, OT Acute Rehabilitation Services Pager [phone] Office [phone]   Delight Stare 08/19/2019, 9:47 AM

## 2019-08-20 LAB — BASIC METABOLIC PANEL
Anion gap: 14 (ref 5–15)
BUN: 135 mg/dL — ABNORMAL HIGH (ref 8–23)
CO2: 18 mmol/L — ABNORMAL LOW (ref 22–32)
Calcium: 9.2 mg/dL (ref 8.9–10.3)
Chloride: 105 mmol/L (ref 98–111)
Creatinine, Ser: 6.57 mg/dL — ABNORMAL HIGH (ref 0.61–1.24)
GFR calc Af Amer: 9 mL/min — ABNORMAL LOW (ref >60)
GFR calc non Af Amer: 7 mL/min — ABNORMAL LOW (ref >60)
Glucose, Bld: 100 mg/dL — ABNORMAL HIGH (ref 70–99)
Potassium: 4.7 mmol/L (ref 3.5–5.1)
Sodium: 137 mmol/L (ref 135–145)

## 2019-08-20 LAB — CBC
HCT: 27.3 % — ABNORMAL LOW (ref 39.0–52.0)
Hemoglobin: 8.7 g/dL — ABNORMAL LOW (ref 13.0–17.0)
MCH: 27 pg (ref 26.0–34.0)
MCHC: 31.9 g/dL (ref 30.0–36.0)
MCV: 84.8 fL (ref 80.0–100.0)
Platelets: 86 10*3/uL — ABNORMAL LOW (ref 150–400)
RBC: 3.22 MIL/uL — ABNORMAL LOW (ref 4.22–5.81)
RDW: 14.5 % (ref 11.5–15.5)
WBC: 3.1 10*3/uL — ABNORMAL LOW (ref 4.0–10.5)
nRBC: 0 % (ref 0.0–0.2)

## 2019-08-20 MED ORDER — CHLORHEXIDINE GLUCONATE CLOTH 2 % EX PADS
6.0000 | MEDICATED_PAD | Freq: Every day | CUTANEOUS | Status: DC
  Administered 2019-08-20 – 2019-08-24 (×4): 6 via TOPICAL

## 2019-08-20 MED ORDER — SODIUM BICARBONATE 650 MG PO TABS
1300.0000 mg | ORAL_TABLET | Freq: Two times a day (BID) | ORAL | Status: DC
  Administered 2019-08-20 – 2019-08-24 (×7): 1300 mg via ORAL
  Filled 2019-08-20 (×7): qty 2

## 2019-08-20 MED ORDER — DARBEPOETIN ALFA 60 MCG/0.3ML IJ SOSY
60.0000 ug | PREFILLED_SYRINGE | Freq: Once | INTRAMUSCULAR | Status: AC
  Administered 2019-08-20: 60 ug via SUBCUTANEOUS
  Filled 2019-08-20: qty 0.3

## 2019-08-20 NOTE — Plan of Care (Signed)
  Problem: Education: Goal: Knowledge of General Education information will improve Description: Including pain rating scale, medication(s)/side effects and non-pharmacologic comfort measures Outcome: Progressing   Problem: Elimination: Goal: Will not experience complications related to urinary retention Outcome: Progressing   

## 2019-08-20 NOTE — Progress Notes (Signed)
5:20am- Pt. had not voided since last night beginning of shift at 7pm. I did bladder scan on pt. It showed 569ml of urine. textpaged on call provider NP Bodenheimer and made him aware. received order to do in and out cath at once.

## 2019-08-20 NOTE — Progress Notes (Signed)
This RN and together with charge nurse Emman performed in and out cath on patient. patient is uncircumcised, we cannot see the hole and the penis is retracting. Multiple attemps done, unsuccessful.

## 2019-08-20 NOTE — Progress Notes (Signed)
PROGRESS NOTE    Tyler Hahn  OZH:[id number] DOB: April 13, 1941 DOA: 08/17/2019 PCP: [name], MD     Brief Narrative:  79 year old man admitted from home on 3/2 after being directed here by his PCP due to abnormal labs.  His past medical history significant for stage IV chronic kidney disease with a baseline creatinine between 3.4 and 3.7, chronic systolic heart failure with an ejection fraction of 20 to 25% status post AICD placement, hypothyroidism, anemia of chronic disease with a baseline hemoglobin between 9 and 11.  He has been complaining of generalized weakness for the past 2 to 3 days, his wife recently passed away 2 weeks ago.  He has been having some depressed mood which has led to decreased oral intake.  He had labs drawn by his PCP and was asked to come to the hospital due to worsening renal function and hyperkalemia.   Assessment & Plan:   Principal Problem:   Acute renal failure (ARF) (HCC) Active Problems:   SYSTOLIC HEART FAILURE, CHRONIC   Hypothyroid   Hyperkalemia   History of anemia due to CKD   Acute on chronic kidney disease stage IV -Baseline creatinine between 3.5 and 3.7. -Creatinine on admission was 6.54, slight decreased to 6.49 3/3, 6.49 3/4. Up slightly to 6.57 on 3/5. -Nephrology is on board. -They believe there is a component of bladder outlet obstruction  He did have a bladder scan with 567 ml overnight. Attempts were made x 2 overnight for I and O cath that were unsuccessful. Discussed with RN who will try again today. If no success, will contact urology for assistance. -Continue holding nephrotoxic agents including lisinopril and torsemide as well as any NSAIDs. -Per patient and charting no spontaneous voiding since yesterday evening at 7 pm. -Patient would not want to go on hemodialysis, no urgent need for hemodialysis.  Hyperkalemia -With no EKG changes on admission. -He was treated with insulin/D5, Lokelma, bicarbonate. -Potassium  is 4.7 today.  Anemia of chronic disease -Due to advanced renal dysfunction. -Hemoglobin has dipped to 8.7 this am.  -Continue to follow and transfuse if <7.0.  Generalized weakness -Likely due to acute on chronic kidney disease. -Recent TSH was normal at 2.91. -There is no evidence of an acute infectious process at this time. -He remains on Zoloft for his depression. -Obtain PT/OT consultations.  Hypothyroidism -TSH is normal, continue home dose of Synthroid.  Chronic systolic heart failure -With the most recent echo from April 2014 at which time ejection fraction was 20 to 25% -This appears stable and compensated at this time. -Outpatient medications include Coreg, lisinopril and torsemide.    DVT prophylaxis: SCDs Code Status: Full code Family Communication: patient only Disposition Plan: Await improvement in renal function to determine if dialysis will be needed prior to DC.  Consultants:   Nephrology  Procedures:   None  Antimicrobials:  Anti-infectives (From admission, onward)   None       Subjective: In bed. Does not have suprapubic pain or an urge to void. Still feels very weak and this is his main complaint.  Objective: Vitals:   08/19/19 2100 08/19/19 2102 08/20/19 0522 08/20/19 0907  BP:  135/65 (!) 102/58 109/68  Pulse:  88 77 65  Resp:  18 14 16   Temp:  98.2 F (36.8 C) 98.7 F (37.1 C) 98.5 F (36.9 C)  TempSrc:  Oral Oral Oral  SpO2:  98% 96% 100%  Weight: 74.7 kg     Height:  Intake/Output Summary (Last 24 hours) at 08/20/2019 0928 Last data filed at 08/20/2019 0921 Gross per 24 hour  Intake 380 ml  Output 0 ml  Net 380 ml   Filed Weights   08/17/19 1635 08/18/19 0124 08/19/19 2100  Weight: 74.8 kg 72.1 kg 74.7 kg    Examination:  General exam: Alert, awake, oriented x 3 Respiratory system: Clear to auscultation. Respiratory effort normal. Cardiovascular system:RRR. No murmurs, rubs, gallops. Gastrointestinal system:  Abdomen is nondistended, soft and nontender. No organomegaly or masses felt. Normal bowel sounds heard. Central nervous system: Alert and oriented. No focal neurological deficits. Extremities: 1+ pitting edema bilaterally., +pedal pulses Skin: No rashes, lesions or ulcers Psychiatry: Judgement and insight appear normal. Mood & affect appropriate.     Data Reviewed: I have personally reviewed following labs and imaging studies  CBC: Recent Labs  Lab 08/16/19 1424 08/17/19 1633 08/18/19 0725 08/19/19 0534 08/20/19 0656  WBC 5.0 5.6 5.3 5.1 3.1*  NEUTROABS 3.1 3.5  --   --   --   HGB 9.5* 9.4* 9.9* 10.0* 8.7*  HCT 29.3* 29.9* 30.7* 31.1* 27.3*  MCV 86.4 86.9 85.5 86.4 84.8  PLT 128.0* 112* 108* 108* 86*   Basic Metabolic Panel: Recent Labs  Lab 08/17/19 1633 08/17/19 2030 08/18/19 0725 08/19/19 0534 08/20/19 0656  NA 136 138 137 138 137  K 6.0* 5.2* 4.8 4.9 4.7  CL 105 107 105 102 105  CO2 20* 19* 18* 18* 18*  GLUCOSE 80 50* 97 155* 100*  BUN 131* 137* 131* 134* 135*  CREATININE 6.70* 6.54* 6.49* 6.49* 6.57*  CALCIUM 9.8 10.0 9.8 9.9 9.2  MG  --  1.6* 1.4*  --   --   PHOS  --   --  6.7*  --   --    GFR: Estimated Creatinine Clearance: 9.1 mL/min (A) (by C-G formula based on SCr of 6.57 mg/dL (H)). Liver Function Tests: Recent Labs  Lab 08/16/19 1424 08/17/19 1633 08/18/19 0725  AST 11 18  --   ALT 11 15  --   ALKPHOS 51 49  --   BILITOT 0.3 0.6  --   PROT 7.6 8.1  --   ALBUMIN 4.3 4.2 3.8   No results for input(s): LIPASE, AMYLASE in the last 168 hours. No results for input(s): AMMONIA in the last 168 hours. Coagulation Profile: No results for input(s): INR, PROTIME in the last 168 hours. Cardiac Enzymes: No results for input(s): CKTOTAL, CKMB, CKMBINDEX, TROPONINI in the last 168 hours. BNP (last 3 results) No results for input(s): PROBNP in the last 8760 hours. HbA1C: No results for input(s): HGBA1C in the last 72 hours. CBG: Recent Labs  Lab  08/18/19 0858  GLUCAP 80   Lipid Profile: No results for input(s): CHOL, HDL, LDLCALC, TRIG, CHOLHDL, LDLDIRECT in the last 72 hours. Thyroid Function Tests: No results for input(s): TSH, T4TOTAL, FREET4, T3FREE, THYROIDAB in the last 72 hours. Anemia Panel: Recent Labs    08/17/19 2033  FERRITIN 244  TIBC 311  IRON 97   Urine analysis:    Component Value Date/Time   COLORURINE STRAW (A) 08/17/2019 1847   APPEARANCEUR CLEAR 08/17/2019 1847   LABSPEC 1.006 08/17/2019 1847   PHURINE 5.0 08/17/2019 1847   GLUCOSEU NEGATIVE 08/17/2019 1847   HGBUR NEGATIVE 08/17/2019 1847   BILIRUBINUR NEGATIVE 08/17/2019 1847   BILIRUBINUR negative 07/28/2014 1419   KETONESUR NEGATIVE 08/17/2019 1847   PROTEINUR NEGATIVE 08/17/2019 1847   UROBILINOGEN 0.2 07/28/2014 1419  NITRITE NEGATIVE 08/17/2019 1847   LEUKOCYTESUR NEGATIVE 08/17/2019 1847   Sepsis Labs: @LABRCNTIP (procalcitonin:4,lacticidven:4)  ) Recent Results (from the past 240 hour(s))  SARS CORONAVIRUS 2 (TAT 6-24 HRS) Nasopharyngeal Nasopharyngeal Swab     Status: None   Collection Time: 08/17/19  7:59 PM   Specimen: Nasopharyngeal Swab  Result Value Ref Range Status   SARS Coronavirus 2 NEGATIVE NEGATIVE Final    Comment: (NOTE) SARS-CoV-2 target nucleic acids are NOT DETECTED. The SARS-CoV-2 RNA is generally detectable in upper and lower respiratory specimens during the acute phase of infection. Negative results do not preclude SARS-CoV-2 infection, do not rule out co-infections with other pathogens, and should not be used as the sole basis for treatment or other patient management decisions. Negative results must be combined with clinical observations, patient history, and epidemiological information. The expected result is Negative. Fact Sheet for Patients: SugarRoll.be Fact Sheet for Healthcare Providers: https://www.woods-mathews.com/ This test is not yet approved or  cleared by the [place] FDA and  has been authorized for detection and/or diagnosis of SARS-CoV-2 by FDA under an Emergency Use Authorization (EUA). This EUA will remain  in effect (meaning this test can be used) for the duration of the COVID-19 declaration under Section 56 4(b)(1) of the Act, 21 U.S.C. section 360bbb-3(b)(1), unless the authorization is terminated or revoked sooner. Performed at [facility], [place] [address] [place], [place] [address]   MRSA PCR Screening     Status: None   Collection Time: 08/18/19  1:27 AM   Specimen: Nasal Mucosa; Nasopharyngeal  Result Value Ref Range Status   MRSA by PCR NEGATIVE NEGATIVE Final    Comment:        The GeneXpert MRSA Assay (FDA approved for NASAL specimens only), is one component of a comprehensive MRSA colonization surveillance program. It is not intended to diagnose MRSA infection nor to guide or monitor treatment for MRSA infections. Performed at [facility], [place] [address] [place], [place] [address]          Radiology Studies: VAS [place] UPPER EXT VEIN MAPPING (PRE-OP AVF)  Result Date: 08/19/2019 UPPER EXTREMITY VEIN MAPPING  Indications: Pre-access. Comparison Study: No prior exam. Performing Technologist: Baldwin Crown ARDMS, RVT  Examination Guidelines: A complete evaluation includes B-mode imaging, spectral Doppler, color Doppler, and power Doppler as needed of all accessible portions of each vessel. Bilateral testing is considered an integral part of a complete examination. Limited examinations for reoccurring indications may be performed as noted. +-----------------+-------------+----------+----------------+ Right Cephalic   Diameter (cm)Depth (cm)    Findings     +-----------------+-------------+----------+----------------+ Prox upper arm                          Non compressible +-----------------+-------------+----------+----------------+ Mid upper arm                           Non  compressible +-----------------+-------------+----------+----------------+ Dist upper arm                          Non compressible +-----------------+-------------+----------+----------------+ Antecubital fossa                       Non compressible +-----------------+-------------+----------+----------------+ Prox forearm                             not visualized  +-----------------+-------------+----------+----------------+  Mid forearm                              not visualized  +-----------------+-------------+----------+----------------+ Dist forearm                             not visualized  +-----------------+-------------+----------+----------------+ Wrist                                    not visualized  +-----------------+-------------+----------+----------------+ +--------------+-------------+----------+--------------+ Right Basilic Diameter (cm)Depth (cm)   Findings    +--------------+-------------+----------+--------------+ Prox upper arm                       not visualized +--------------+-------------+----------+--------------+ Wrist                                not visualized +--------------+-------------+----------+--------------+ +--------------+-------------+----------+--------------+ Left Cephalic Diameter (cm)Depth (cm)   Findings    +--------------+-------------+----------+--------------+ Prox upper arm                       not visualized +--------------+-------------+----------+--------------+ Wrist                                not visualized +--------------+-------------+----------+--------------+ +--------------+-------------+----------+--------------+ Left Basilic  Diameter (cm)Depth (cm)   Findings    +--------------+-------------+----------+--------------+ Prox upper arm                       not visualized +--------------+-------------+----------+--------------+ Wrist                                not  visualized +--------------+-------------+----------+--------------+ Summary: Right: Right cephalic vein non compressible and thrombus visualized        adjacent to IV in antecubital fossa. Right basilic vein not        visualized. Left: Left cephalic and basilic veins not visualized. *See table(s) above for measurements and observations.  Diagnosing physician:    Preliminary         Scheduled Meds: . albuterol  10 mg Nebulization Once  . atorvastatin  20 mg Oral Daily  . carvedilol  12.5 mg Oral BID  . levothyroxine  25 mcg Oral QAC breakfast  . sodium bicarbonate  650 mg Oral BID  . sodium chloride flush  3 mL Intravenous Q12H   Continuous Infusions:    LOS: 3 days    Time spent: 25 minutes.. Greater than 50% of this time was spent in direct contact with the patient, coordinating care and discussing relevant ongoing clinical issues.     Lelon Frohlich, MD [facility] Pager [phone]  If 7PM-7AM, please contact night-coverage [email or web address] Password [email or web address] 08/20/2019, 9:28 AM

## 2019-08-20 NOTE — Plan of Care (Signed)
  Problem: Clinical Measurements: Goal: Ability to maintain clinical measurements within normal limits will improve Outcome: Progressing   

## 2019-08-20 NOTE — TOC Initial Note (Signed)
Transition of Care [facility]) - Initial/Assessment Note    Patient Details  Name: Tyler Hahn MRN: [id number] Date of Birth: 1941-03-10  [facility] of [facility]) CM/SW Contact:    Marilu Favre, RN Phone Number: 08/20/2019, 1:53 PM  Clinical Narrative:                 Patient from home. PT recommended home health PT. Discussed with patient and daughter ( daughter was on speaker phone). Provided Medicare.gov home health list. Daughter will also look up website. They would like time to decide on agency. Explained will need their top five choices. May have to call multiple agencies to find one to accept. Both voiced understanding.  Expected Discharge Plan: [place] Barriers to Discharge: Continued Medical Work up   Patient Goals and CMS Choice Patient states their goals for this hospitalization and ongoing recovery are:: to return to home CMS Medicare.gov Compare Post Acute Care list provided to:: Patient Choice offered to / list presented to : Patient  Expected Discharge Plan and Services Expected Discharge Plan: [place] Choice: [place] arrangements for the past 2 months: Single Family Home                   DME Agency: NA       HH Arranged: PT          Prior Living Arrangements/Services Living arrangements for the past 2 months: Single Family Home Lives with:: Self Patient language and need for interpreter reviewed:: Yes Do you feel safe going back to the place where you live?: Yes      Need for Family Participation in Patient Care: Yes (Comment) Care giver support system in place?: Yes (comment)   Criminal Activity/Legal Involvement Pertinent to Current Situation/Hospitalization: No - Comment as needed  Activities of Daily Living Home Assistive Devices/Equipment: Cane (specify quad or straight), Walker (specify type) ADL Screening (condition at time of admission) Patient's cognitive  ability adequate to safely complete daily activities?: Yes Is the patient deaf or have difficulty hearing?: No Does the patient have difficulty seeing, even when wearing glasses/contacts?: No Does the patient have difficulty concentrating, remembering, or making decisions?: No Patient able to express need for assistance with ADLs?: Yes Does the patient have difficulty dressing or bathing?: No Independently performs ADLs?: Yes (appropriate for developmental age) Does the patient have difficulty walking or climbing stairs?: No Weakness of Legs: Both Weakness of Arms/Hands: Both  Permission Sought/Granted   Permission granted to share information with : Yes, Verbal Permission Granted  Share Information with NAME: daughter           Emotional Assessment Appearance:: Appears stated age Attitude/Demeanor/Rapport: Engaged Affect (typically observed): Accepting Orientation: : Oriented to Self, Oriented to Place, Oriented to  Time, Oriented to Situation Alcohol / Substance Use: Not Applicable Psych Involvement: No (comment)  Admission diagnosis:  Hyperkalemia [E87.5] AKI (acute kidney injury) ([place]) [N17.9] Patient Active Problem List   Diagnosis Date Noted  . Acute renal failure (ARF) ([place]) 08/18/2019  . History of anemia due to CKD 08/18/2019  . Hyperkalemia 08/17/2019  . Hypothyroid 08/16/2019  . Physical exam 02/18/2019  . Greater trochanteric bursitis of left hip 08/04/2018  . Osteoarthritis 09/25/2016  . Screen for colon cancer   . Benign neoplasm of transverse colon   . Edema 07/28/2014  . Elevated LFTs 07/28/2014  . Adjustment disorder with depressed mood 03/24/2014  .  Epigastric pain 01/27/2014  . Small bowel mass 12/20/2013  . Abdominal pain, left upper quadrant 11/25/2013  . Myalgia 07/07/2013  . Loss of weight 04/19/2013  . Polyarthralgia 04/19/2013  . Loss of appetite 04/19/2013  . 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 Right elbow pain 08/17/2012  . 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 SYSTOLIC HEART FAILURE, CHRONIC 06/07/2010  . Chronic kidney disease (CKD), stage IV (severe) ([place]) 04/04/2010  . TESTICULAR MASS 04/04/2010  . DM (diabetes mellitus) type II controlled with renal manifestation ([place]) 03/07/2010  . Hyperlipidemia 03/07/2010  . GOUT 03/07/2010  . HYPERTENSION, BENIGN ESSENTIAL 03/07/2010  . IMPLANTABLE CARDIAC DEFIBRILLATOR -[employer]-SINGLE 03/07/2010   PCP:  [name], MD Pharmacy:   [facility] [address] ([address]), [place] - [address]  ([place]) [place] [address] Phone: [phone] Fax: [phone]  [id number] - [address], [place] [address] Phone: [phone] Fax: [phone]  [place], [place] - [place] [address] Phone: [phone] Fax: [phone]  [id number] [place], [place] - [address] AT [facility] [address] [place] [address] Phone: [phone] Fax: [phone]     Social Determinants of Health (SDOH) Interventions    Readmission Risk Interventions No flowsheet data found.

## 2019-08-20 NOTE — Progress Notes (Signed)
[facility] Progress Note   79 y.o.male HTN, DM, HLD, NICM s/p ICD placement, and CKD stage IV (followed by Dr. Joelyn Oms in our office) who was seen for routine follow up by his PCP yesterday andnoted to have AKI and hyperkalemia. He was instructed to to the ED to be further evaluated. In the ED today, his labs were rechecked with worsening renal function, azotemia, and hyperkalemia. He did lose his wife of over 50 years several weeks ago (she was on dialysis for a short time but did not do well). He denies any NSAIDs or COX-II I's but has been on lisinopril and torsemide prior to admission.  Assessment/ Plan:   1. AKI/CKD stage IV- suspect bladder outlet obstruction given palpable suprapubic mass and bladder scan with 675 ml. Renal US - technically difficult, small kidneys but no hydro. 1. Continue holdinglisinopril and torsemide for now; incr to NaHCO3 2 tabs bid. 2. No uremic symptoms so no urgent need for dialysis; unfortunately renal function worsening with failed foley insertion attempts last night. 3. Will need urology's assistance with foley insertion. 2. Hyperkalemia- no ECG changes. Pt treated in the ED with bicarb, insulin/D5, lokelma but will be helped most with relief of bladder outlet obstruction and foley catheter placement. 3. Anemia of CKD stage IV- will check iron stores but will likely need to start ESA therapy -> will give Aranesp 60 mcg sq 3/5 4. HTN- stable and follow off of lisinopril. May need to start hydralazine or amlodipine if BP rises. We can always rechallenge with Lisinopril at outpt clinic ([facility]) if he returns to where he was before.  5. DM- per primary 6. NICMP s/p AICD- stable 7. CKD stage IV- felt to be due to DM and followed closely by Dr. Joelyn Oms. He is leaning towards PD if he needs to start dialysis but has agreed to backup avf placement. 8. Thrombocytopenia- continue to follow 9. Right low back pain- possibly related to bladder  outlet obstruction. Will add urine culture and await renal US.  Subjective:   Feeling well and denies f/c/n/v/ dyspnea/ CP. Ate breakfast this am; failed foley insertion x2 last night.   Objective:   BP 109/68 (BP Location: Left Arm)   Pulse 65   Temp 98.5 F (36.9 C) (Oral)   Resp 16   Ht 5\' 8"  (1.727 m)   Wt 74.7 kg   SpO2 100%   BMI 25.04 kg/m   Intake/Output Summary (Last 24 hours) at 08/20/2019 1201 Last data filed at 08/20/2019 7915 Gross per 24 hour  Intake 380 ml  Output 600 ml  Net -220 ml   Weight change:   Physical Exam: General appearance:NAD, NCAT Resp:clear to auscultation bilaterallywith no rales noted Cardio:regular rate and rhythm, no rub andIII/VI SEM around precordium AV:WPVX, nontender, +BS Extremities:extremities normal, atraumatic, no cyanosis or edema Neuro: no asterixis  Imaging: VAS [place] UPPER EXT VEIN MAPPING (PRE-OP AVF)  Result Date: 08/19/2019 UPPER EXTREMITY VEIN MAPPING  Indications: Pre-access. Comparison Study: No prior exam. Performing Technologist: Baldwin Crown ARDMS, RVT  Examination Guidelines: A complete evaluation includes B-mode imaging, spectral Doppler, color Doppler, and power Doppler as needed of all accessible portions of each vessel. Bilateral testing is considered an integral part of a complete examination. Limited examinations for reoccurring indications may be performed as noted. +-----------------+-------------+----------+----------------+ Right Cephalic   Diameter (cm)Depth (cm)    Findings     +-----------------+-------------+----------+----------------+ Prox upper arm  Non compressible +-----------------+-------------+----------+----------------+ Mid upper arm                           Non compressible +-----------------+-------------+----------+----------------+ Dist upper arm                          Non compressible  +-----------------+-------------+----------+----------------+ Antecubital fossa                       Non compressible +-----------------+-------------+----------+----------------+ Prox forearm                             not visualized  +-----------------+-------------+----------+----------------+ Mid forearm                              not visualized  +-----------------+-------------+----------+----------------+ Dist forearm                             not visualized  +-----------------+-------------+----------+----------------+ Wrist                                    not visualized  +-----------------+-------------+----------+----------------+ +--------------+-------------+----------+--------------+ Right Basilic Diameter (cm)Depth (cm)   Findings    +--------------+-------------+----------+--------------+ Prox upper arm                       not visualized +--------------+-------------+----------+--------------+ Wrist                                not visualized +--------------+-------------+----------+--------------+ +--------------+-------------+----------+--------------+ Left Cephalic Diameter (cm)Depth (cm)   Findings    +--------------+-------------+----------+--------------+ Prox upper arm                       not visualized +--------------+-------------+----------+--------------+ Wrist                                not visualized +--------------+-------------+----------+--------------+ +--------------+-------------+----------+--------------+ Left Basilic  Diameter (cm)Depth (cm)   Findings    +--------------+-------------+----------+--------------+ Prox upper arm                       not visualized +--------------+-------------+----------+--------------+ Wrist                                not visualized +--------------+-------------+----------+--------------+ Summary: Right: Right cephalic vein non compressible and thrombus  visualized        adjacent to IV in antecubital fossa. Right basilic vein not        visualized. Left: Left cephalic and basilic veins not visualized. *See table(s) above for measurements and observations.  Diagnosing physician:    Preliminary     Labs: BMET Recent Labs  Lab 08/16/19 1424 08/17/19 1633 08/17/19 2030 08/18/19 0725 08/19/19 0534 08/20/19 0656  NA 137 136 138 137 138 137  K 5.6* 6.0* 5.2* 4.8 4.9 4.7  CL 104 105 107 105 102 105  CO2 20 20* 19* 18* 18* 18*  GLUCOSE 83 80 50* 97 155* 100*  BUN 119* 131* 137* 131* 134* 135*  CREATININE 6.52* 6.70* 6.54* 6.49*  6.49* 6.57*  CALCIUM 10.5 9.8 10.0 9.8 9.9 9.2  PHOS  --   --   --  6.7*  --   --    CBC Recent Labs  Lab 08/16/19 1424 08/16/19 1424 08/17/19 1633 08/18/19 0725 08/19/19 0534 08/20/19 0656  WBC 5.0   < > 5.6 5.3 5.1 3.1*  NEUTROABS 3.1  --  3.5  --   --   --   HGB 9.5*   < > 9.4* 9.9* 10.0* 8.7*  HCT 29.3*   < > 29.9* 30.7* 31.1* 27.3*  MCV 86.4   < > 86.9 85.5 86.4 84.8  PLT 128.0*   < > 112* 108* 108* 86*   < > = values in this interval not displayed.    Medications:    . albuterol  10 mg Nebulization Once  . atorvastatin  20 mg Oral Daily  . carvedilol  12.5 mg Oral BID  . levothyroxine  25 mcg Oral QAC breakfast  . sodium bicarbonate  650 mg Oral BID  . sodium chloride flush  3 mL Intravenous Q12H      Otelia Santee, MD 08/20/2019, 12:01 PM

## 2019-08-20 NOTE — Progress Notes (Signed)
Occupational Therapy Treatment Patient Details Name: Tyler Hahn MRN: [id number] DOB: 04/06/1941 Today's Date: 08/20/2019    History of present illness 79 yo male admitted to ED on 3/2 for LE weakness, AKI. PMH includes CKD IV, anxiety, AICD, CHF with EF 20-25%, DM, gout, HLD, HTN.   OT comments  Pt progressing towards acute OT goals. Pt completed household distance ambulation and toilet transfer this session at supervision-min guard level. Pt oriented to self and knows he's at "hospital", identified year as "2020" and month as "October." Pt talked about his 4 kids and provided their career as well as recalling that he and his wife married when they were "49 and 30." Unable to verify accuracy of information provided by pt but suspect his long term memory maybe better than short- term.  Pt up to recliner with lunch tray at end of session. D/c plan remains appropriate.     Follow Up Recommendations  Home health OT;Supervision/Assistance - 24 hour    Equipment Recommendations  None recommended by OT    Recommendations for Other Services      Precautions / Restrictions Precautions Precautions: Fall Restrictions Weight Bearing Restrictions: No       Mobility Bed Mobility Overal bed mobility: Needs Assistance Bed Mobility: Supine to Sit     Supine to sit: HOB elevated;Supervision        Transfers Overall transfer level: Needs assistance Equipment used: None Transfers: Sit to/from Stand Sit to Stand: Supervision         General transfer comment: supervision for safety. stood with wide BOS support initially    Balance Overall balance assessment: Needs assistance Sitting-balance support: No upper extremity supported;Feet supported Sitting balance-Leahy Scale: Good     Standing balance support: No upper extremity supported;During functional activity Standing balance-Leahy Scale: Good Standing balance comment: supervision                           ADL  either performed or assessed with clinical judgement   ADL Overall ADL's : Needs assistance/impaired     Grooming: Supervision/safety;Standing                   Toilet Transfer: Supervision/safety;Ambulation           Functional mobility during ADLs: Supervision/safety General ADL Comments: Pt completed household distance ambulation in the halls at supervision level. Some unsteadines noted during sit<>stand transfers but on overt LOB.      Vision       Perception     Praxis      Cognition Arousal/Alertness: Awake/alert Behavior During Therapy: Flat affect;WFL for tasks assessed/performed Overall Cognitive Status: No family/caregiver present to determine baseline cognitive functioning                                 General Comments: flat affect but pleasant and responding to questions appropriately. Knows he's at the hospital but couldn't recall hospital name. Year: "2020". Month "October". Slow processing. STM deficits. Reports wife of 60 years passed away 2 weeks ago        Exercises Exercises: Other exercises Other Exercises Other Exercises: issued theraband level 1 and hand held grip ball   Shoulder Instructions       General Comments      Pertinent Vitals/ Pain       Pain Assessment: Faces Faces Pain Scale: Hurts a little bit Pain Location:  BLE guarded movements while walking Pain Descriptors / Indicators: Aching Pain Intervention(s): Monitored during session;Limited activity within patient's tolerance  Home Living                                          Prior Functioning/Environment              Frequency  Min 2X/week        Progress Toward Goals  OT Goals(current goals can now be found in the care plan section)  Progress towards OT goals: Progressing toward goals  Acute Rehab OT Goals Patient Stated Goal: go home OT Goal Formulation: With patient Time For Goal Achievement: 09/02/19 Potential  to Achieve Goals: Good ADL Goals Pt Will Perform Grooming: with modified independence;standing Pt Will Perform Lower Body Dressing: with modified independence;sit to/from stand Pt Will Transfer to Toilet: with modified independence;ambulating Pt Will Perform Tub/Shower Transfer: Shower transfer;with modified independence;shower seat;ambulating;grab bars Additional ADL Goal #1: Pt will complete 3 step trail making task with no more than minimal cueing, using compensatory strategies as needed.  Plan Discharge plan remains appropriate    Co-evaluation                 AM-PAC OT "6 Clicks" Daily Activity     Outcome Measure   Help from another person eating meals?: None Help from another person taking care of personal grooming?: None Help from another person toileting, which includes using toliet, bedpan, or urinal?: A Little Help from another person bathing (including washing, rinsing, drying)?: A Little Help from another person to put on and taking off regular upper body clothing?: None Help from another person to put on and taking off regular lower body clothing?: A Little 6 Click Score: 21    End of Session    OT Visit Diagnosis: Other symptoms and signs involving cognitive function;Muscle weakness (generalized) (M62.81)   Activity Tolerance Patient tolerated treatment well   Patient Left in chair;with call bell/phone within reach;with chair alarm set   Nurse Communication          Time: 9983-3825 OT Time Calculation (min): 22 min  Charges: OT General Charges $OT Visit: 1 Visit OT Treatments $Self Care/Home Management : 8-22 mins  Tyler Hahn, OT Acute Rehabilitation Services Pager: [phone] Office: [phone]    Tyler Hahn 08/20/2019, 1:11 PM

## 2019-08-21 LAB — BASIC METABOLIC PANEL WITH GFR
Anion gap: 14 (ref 5–15)
BUN: 140 mg/dL — ABNORMAL HIGH (ref 8–23)
CO2: 18 mmol/L — ABNORMAL LOW (ref 22–32)
Calcium: 9.2 mg/dL (ref 8.9–10.3)
Chloride: 108 mmol/L (ref 98–111)
Creatinine, Ser: 6.92 mg/dL — ABNORMAL HIGH (ref 0.61–1.24)
GFR calc Af Amer: 8 mL/min — ABNORMAL LOW
GFR calc non Af Amer: 7 mL/min — ABNORMAL LOW
Glucose, Bld: 96 mg/dL (ref 70–99)
Potassium: 4.8 mmol/L (ref 3.5–5.1)
Sodium: 140 mmol/L (ref 135–145)

## 2019-08-21 LAB — CBC
HCT: 26.6 % — ABNORMAL LOW (ref 39.0–52.0)
Hemoglobin: 8.8 g/dL — ABNORMAL LOW (ref 13.0–17.0)
MCH: 27.3 pg (ref 26.0–34.0)
MCHC: 33.1 g/dL (ref 30.0–36.0)
MCV: 82.6 fL (ref 80.0–100.0)
Platelets: 92 10*3/uL — ABNORMAL LOW (ref 150–400)
RBC: 3.22 MIL/uL — ABNORMAL LOW (ref 4.22–5.81)
RDW: 14.2 % (ref 11.5–15.5)
WBC: 3.8 10*3/uL — ABNORMAL LOW (ref 4.0–10.5)
nRBC: 0 % (ref 0.0–0.2)

## 2019-08-21 NOTE — Progress Notes (Signed)
PROGRESS NOTE    CORDON GASSETT  GGE:[id number] DOB: 06-Oct-1940 DOA: 08/17/2019 PCP: [name], MD     Brief Narrative:  79 year old man admitted from home on 3/2 after being directed here by his PCP due to abnormal labs.  His past medical history significant for stage IV chronic kidney disease with a baseline creatinine between 3.4 and 3.7, chronic systolic heart failure with an ejection fraction of 20 to 25% status post AICD placement, hypothyroidism, anemia of chronic disease with a baseline hemoglobin between 9 and 11.  He has been complaining of generalized weakness for the past 2 to 3 days, his wife recently passed away 2 weeks ago.  He has been having some depressed mood which has led to decreased oral intake.  He had labs drawn by his PCP and was asked to come to the hospital due to worsening renal function and hyperkalemia.   Assessment & Plan:   Principal Problem:   Acute renal failure (ARF) (HCC) Active Problems:   SYSTOLIC HEART FAILURE, CHRONIC   Hypothyroid   Hyperkalemia   History of anemia due to CKD   Acute on chronic kidney disease stage IV -Baseline creatinine between 3.5 and 3.7. -Creatinine on admission was 6.54, slight decreased to 6.49 3/3, 6.49 3/4. Up slightly to 6.57 on 3/5. Further increased to 6.92 on 3/6. -Nephrology is on board. -Foley placed 3/5. -Continue holding nephrotoxic agents including lisinopril and torsemide as well as any NSAIDs. -Vein mapping complete. -Permanent access next week if no improvement in renal function.  Hyperkalemia -With no EKG changes on admission. -He was treated with insulin/D5, Lokelma, bicarbonate. -Potassium is 4.8 today.  Anemia of chronic disease -Due to advanced renal dysfunction. -Hemoglobin is 8.8 today.  -Continue to follow and transfuse if <7.0.  Generalized weakness -Likely due to acute on chronic kidney disease. -Recent TSH was normal at 2.91. -There is no evidence of an acute infectious  process at this time. -He remains on Zoloft for his depression. -PT pending; OT: HH with supervision  Hypothyroidism -TSH is normal, continue home dose of Synthroid.  Chronic systolic heart failure -With the most recent echo from April 2014 at which time ejection fraction was 20 to 25% -This appears stable and compensated at this time. -Outpatient medications include Coreg, lisinopril and torsemide.    DVT prophylaxis: SCDs Code Status: Full code Family Communication: patient only Disposition Plan: Await improvement in renal function to determine if dialysis will be needed prior to DC.  Consultants:   Nephrology  Procedures:   None  Antimicrobials:  Anti-infectives (From admission, onward)   None       Subjective: In bed, resting. Arouses easily to voice. Has no new issues/complaints today.  Objective: Vitals:   08/20/19 1901 08/21/19 0500 08/21/19 0501 08/21/19 0915  BP: (!) 143/62  111/72 118/76  Pulse: 69  72 66  Resp: 18  16 17   Temp: 98.8 F (37.1 C)  98.6 F (37 C)   TempSrc: Oral  Oral   SpO2: 98%  98% 100%  Weight: 76.6 kg 76.6 kg    Height:        Intake/Output Summary (Last 24 hours) at 08/21/2019 1118 Last data filed at 08/21/2019 4650 Gross per 24 hour  Intake 380 ml  Output 650 ml  Net -270 ml   Filed Weights   08/19/19 2100 08/20/19 1901 08/21/19 0500  Weight: 74.7 kg 76.6 kg 76.6 kg    Examination:  General exam: Alert, awake, oriented x 3  Respiratory system: Clear to auscultation. Respiratory effort normal. Cardiovascular system:RRR. No murmurs, rubs, gallops. Gastrointestinal system: Abdomen is nondistended, soft and nontender. No organomegaly or masses felt. Normal bowel sounds heard. Central nervous system: Alert and oriented. No focal neurological deficits. Extremities: 1+ pitting edema bilaterally., +pedal pulses Skin: No rashes, lesions or ulcers Psychiatry: Judgement and insight appear normal. Mood & affect appropriate.      Data Reviewed: I have personally reviewed following labs and imaging studies  CBC: Recent Labs  Lab 08/16/19 1424 08/16/19 1424 08/17/19 1633 08/18/19 0725 08/19/19 0534 08/20/19 0656 08/21/19 0346  WBC 5.0   < > 5.6 5.3 5.1 3.1* 3.8*  NEUTROABS 3.1  --  3.5  --   --   --   --   HGB 9.5*   < > 9.4* 9.9* 10.0* 8.7* 8.8*  HCT 29.3*   < > 29.9* 30.7* 31.1* 27.3* 26.6*  MCV 86.4   < > 86.9 85.5 86.4 84.8 82.6  PLT 128.0*   < > 112* 108* 108* 86* 92*   < > = values in this interval not displayed.   Basic Metabolic Panel: Recent Labs  Lab 08/17/19 2030 08/18/19 0725 08/19/19 0534 08/20/19 0656 08/21/19 0346  NA 138 137 138 137 140  K 5.2* 4.8 4.9 4.7 4.8  CL 107 105 102 105 108  CO2 19* 18* 18* 18* 18*  GLUCOSE 50* 97 155* 100* 96  BUN 137* 131* 134* 135* 140*  CREATININE 6.54* 6.49* 6.49* 6.57* 6.92*  CALCIUM 10.0 9.8 9.9 9.2 9.2  MG 1.6* 1.4*  --   --   --   PHOS  --  6.7*  --   --   --    GFR: Estimated Creatinine Clearance: 8.6 mL/min (A) (by C-G formula based on SCr of 6.92 mg/dL (H)). Liver Function Tests: Recent Labs  Lab 08/16/19 1424 08/17/19 1633 08/18/19 0725  AST 11 18  --   ALT 11 15  --   ALKPHOS 51 49  --   BILITOT 0.3 0.6  --   PROT 7.6 8.1  --   ALBUMIN 4.3 4.2 3.8   No results for input(s): LIPASE, AMYLASE in the last 168 hours. No results for input(s): AMMONIA in the last 168 hours. Coagulation Profile: No results for input(s): INR, PROTIME in the last 168 hours. Cardiac Enzymes: No results for input(s): CKTOTAL, CKMB, CKMBINDEX, TROPONINI in the last 168 hours. BNP (last 3 results) No results for input(s): PROBNP in the last 8760 hours. HbA1C: No results for input(s): HGBA1C in the last 72 hours. CBG: Recent Labs  Lab 08/18/19 0858  GLUCAP 80   Lipid Profile: No results for input(s): CHOL, HDL, LDLCALC, TRIG, CHOLHDL, LDLDIRECT in the last 72 hours. Thyroid Function Tests: No results for input(s): TSH, T4TOTAL, FREET4,  T3FREE, THYROIDAB in the last 72 hours. Anemia Panel: No results for input(s): VITAMINB12, FOLATE, FERRITIN, TIBC, IRON, RETICCTPCT in the last 72 hours. Urine analysis:    Component Value Date/Time   COLORURINE STRAW (A) 08/17/2019 1847   APPEARANCEUR CLEAR 08/17/2019 1847   LABSPEC 1.006 08/17/2019 1847   PHURINE 5.0 08/17/2019 1847   GLUCOSEU NEGATIVE 08/17/2019 1847   HGBUR NEGATIVE 08/17/2019 1847   BILIRUBINUR NEGATIVE 08/17/2019 1847   BILIRUBINUR negative 07/28/2014 1419   KETONESUR NEGATIVE 08/17/2019 1847   PROTEINUR NEGATIVE 08/17/2019 1847   UROBILINOGEN 0.2 07/28/2014 1419   NITRITE NEGATIVE 08/17/2019 1847   LEUKOCYTESUR NEGATIVE 08/17/2019 1847   Sepsis Labs: @LABRCNTIP (procalcitonin:4,lacticidven:4)  ) Recent  Results (from the past 240 hour(s))  SARS CORONAVIRUS 2 (TAT 6-24 HRS) Nasopharyngeal Nasopharyngeal Swab     Status: None   Collection Time: 08/17/19  7:59 PM   Specimen: Nasopharyngeal Swab  Result Value Ref Range Status   SARS Coronavirus 2 NEGATIVE NEGATIVE Final    Comment: (NOTE) SARS-CoV-2 target nucleic acids are NOT DETECTED. The SARS-CoV-2 RNA is generally detectable in upper and lower respiratory specimens during the acute phase of infection. Negative results do not preclude SARS-CoV-2 infection, do not rule out co-infections with other pathogens, and should not be used as the sole basis for treatment or other patient management decisions. Negative results must be combined with clinical observations, patient history, and epidemiological information. The expected result is Negative. Fact Sheet for Patients: SugarRoll.be Fact Sheet for Healthcare Providers: https://www.woods-mathews.com/ This test is not yet approved or cleared by the [place] FDA and  has been authorized for detection and/or diagnosis of SARS-CoV-2 by FDA under an Emergency Use Authorization (EUA). This EUA will remain  in  effect (meaning this test can be used) for the duration of the COVID-19 declaration under Section 56 4(b)(1) of the Act, 21 U.S.C. section 360bbb-3(b)(1), unless the authorization is terminated or revoked sooner. Performed at [facility], [place] [address], [place], [place] [address]   MRSA PCR Screening     Status: None   Collection Time: 08/18/19  1:27 AM   Specimen: Nasal Mucosa; Nasopharyngeal  Result Value Ref Range Status   MRSA by PCR NEGATIVE NEGATIVE Final    Comment:        The GeneXpert MRSA Assay (FDA approved for NASAL specimens only), is one component of a comprehensive MRSA colonization surveillance program. It is not intended to diagnose MRSA infection nor to guide or monitor treatment for MRSA infections. Performed at [facility], [place] [address], [place], [place] [address]          Radiology Studies: VAS [place] UPPER EXT VEIN MAPPING (PRE-OP AVF)  Result Date: 08/20/2019 UPPER EXTREMITY VEIN MAPPING  Indications: Pre-access. Comparison Study: No prior exam. Performing Technologist: Baldwin Crown ARDMS, RVT  Examination Guidelines: A complete evaluation includes B-mode imaging, spectral Doppler, color Doppler, and power Doppler as needed of all accessible portions of each vessel. Bilateral testing is considered an integral part of a complete examination. Limited examinations for reoccurring indications may be performed as noted. +-----------------+-------------+----------+----------------+ Right Cephalic   Diameter (cm)Depth (cm)    Findings     +-----------------+-------------+----------+----------------+ Prox upper arm                          Non compressible +-----------------+-------------+----------+----------------+ Mid upper arm                           Non compressible +-----------------+-------------+----------+----------------+ Dist upper arm                          Non compressible  +-----------------+-------------+----------+----------------+ Antecubital fossa                       Non compressible +-----------------+-------------+----------+----------------+ Prox forearm                             not visualized  +-----------------+-------------+----------+----------------+ Mid forearm  not visualized  +-----------------+-------------+----------+----------------+ Dist forearm                             not visualized  +-----------------+-------------+----------+----------------+ Wrist                                    not visualized  +-----------------+-------------+----------+----------------+ +--------------+-------------+----------+--------------+ Right Basilic Diameter (cm)Depth (cm)   Findings    +--------------+-------------+----------+--------------+ Prox upper arm                       not visualized +--------------+-------------+----------+--------------+ Wrist                                not visualized +--------------+-------------+----------+--------------+ +--------------+-------------+----------+--------------+ Left Cephalic Diameter (cm)Depth (cm)   Findings    +--------------+-------------+----------+--------------+ Prox upper arm                       not visualized +--------------+-------------+----------+--------------+ Wrist                                not visualized +--------------+-------------+----------+--------------+ +--------------+-------------+----------+--------------+ Left Basilic  Diameter (cm)Depth (cm)   Findings    +--------------+-------------+----------+--------------+ Prox upper arm                       not visualized +--------------+-------------+----------+--------------+ Wrist                                not visualized +--------------+-------------+----------+--------------+ Summary: Right: Right cephalic vein non compressible and thrombus  visualized        adjacent to IV in antecubital fossa. Right basilic vein not        visualized. Left: Left cephalic and basilic veins not visualized. *See table(s) above for measurements and observations.  Diagnosing physician: Monica Martinez MD Electronically signed by Monica Martinez MD on 08/20/2019 at 4:32:09 PM.    Final         Scheduled Meds: . albuterol  10 mg Nebulization Once  . atorvastatin  20 mg Oral Daily  . carvedilol  12.5 mg Oral BID  . Chlorhexidine Gluconate Cloth  6 each Topical Daily  . levothyroxine  25 mcg Oral QAC breakfast  . sodium bicarbonate  1,300 mg Oral BID  . sodium chloride flush  3 mL Intravenous Q12H   Continuous Infusions:    LOS: 4 days    Time spent: 25 minutes.. Greater than 50% of this time was spent in direct contact with the patient, coordinating care and discussing relevant ongoing clinical issues.     Lelon Frohlich, MD [facility] Pager [phone]  If 7PM-7AM, please contact night-coverage [email or web address] Password [email or web address] 08/21/2019, 11:18 AM

## 2019-08-21 NOTE — Progress Notes (Signed)
[facility] Progress Note   79 y.o.male HTN, DM, HLD, NICM s/p ICD placement, and CKD stage IV (followed by Dr. Joelyn Oms in our office) who was seen for routine follow up by his PCP yesterday andnoted to have AKI and hyperkalemia. He was instructed to to the ED to be further evaluated. In the ED today, his labs were rechecked with worsening renal function, azotemia, and hyperkalemia. He did lose his wife of over 50 years several weeks ago (she was on dialysis for a short time but did not do well). He denies any NSAIDs or COX-II I's but has been on lisinopril and torsemide prior to admission.  Assessment/ Plan:   1. AKI/CKD stage IV- suspect bladder outlet obstruction given palpable suprapubic mass and bladder scan with 675 ml. Renal US - technically difficult, small kidneys but no hydro. 1. Continue holdinglisinopril and torsemide for now; incr to NaHCO3 2 tabs bid. 2. No uremic symptoms so no urgent need for dialysis but renal function continues to worsen despite  foley insertion but UOP much better so obstruction certainly contributing. 3. Question is whether damage has already been done; I will check labs tomorrow. Already d/w [facility] Dr. Carlis Abbott and if numbers not better by tomorrow then he will see pt Sun and schedule for [facility] and permanent access for mon. 2. Hyperkalemia- no ECG changes. Pt treated in the ED with bicarb, insulin/D5, lokelma but will be helped most with relief of bladder outlet obstruction and foley catheter placement. 3. Anemia of CKD stage IV- will check iron stores but will likely need to start ESA therapy -> will give Aranesp 60 mcg sq 3/5 4. HTN- stable and follow off of lisinopril. May need to start hydralazine or amlodipine if BP rises. We can always rechallenge with Lisinopril at outpt clinic ([facility]) if he returns to where he was before. 5. DM- per primary 6. NICMP s/p AICD- stable 7. CKD stage IV- felt to be due to DM and followed closely by Dr.  Joelyn Oms. He is leaning towards PD if he needs to start dialysis but has agreed to backup avf placement. 8. Thrombocytopenia- continue to follow 9. Right low back pain- possibly related to bladder outlet obstruction. Will add urine culture and await renal US.  Subjective:   Feels fine and denies n/v/d/cp/dyspnea   Objective:   BP 111/72 (BP Location: Left Arm)   Pulse 72   Temp 98.6 F (37 C) (Oral)   Resp 16   Ht 5\' 8"  (1.727 m)   Wt 76.6 kg   SpO2 98%   BMI 25.68 kg/m   Intake/Output Summary (Last 24 hours) at 08/21/2019 0802 Last data filed at 08/21/2019 7829 Gross per 24 hour  Intake 440 ml  Output 1250 ml  Net -810 ml   Weight change: 1.9 kg  Physical Exam: General appearance:NAD, NCAT Resp:clear to auscultation bilaterallywith no rales noted Cardio:regular rate and rhythm, no rub andIII/VI SEM around precordium FA:OZHY, nontender, +BS Extremities:extremities normal, atraumatic, no cyanosis or edema Neuro: no asterixis GU: foley to gravity  Imaging: VAS [place] UPPER EXT VEIN MAPPING (PRE-OP AVF)  Result Date: 08/20/2019 UPPER EXTREMITY VEIN MAPPING  Indications: Pre-access. Comparison Study: No prior exam. Performing Technologist: Baldwin Crown ARDMS, RVT  Examination Guidelines: A complete evaluation includes B-mode imaging, spectral Doppler, color Doppler, and power Doppler as needed of all accessible portions of each vessel. Bilateral testing is considered an integral part of a complete examination. Limited examinations for reoccurring indications may be performed as noted. +-----------------+-------------+----------+----------------+  Right Cephalic   Diameter (cm)Depth (cm)    Findings     +-----------------+-------------+----------+----------------+ Prox upper arm                          Non compressible +-----------------+-------------+----------+----------------+ Mid upper arm                           Non compressible  +-----------------+-------------+----------+----------------+ Dist upper arm                          Non compressible +-----------------+-------------+----------+----------------+ Antecubital fossa                       Non compressible +-----------------+-------------+----------+----------------+ Prox forearm                             not visualized  +-----------------+-------------+----------+----------------+ Mid forearm                              not visualized  +-----------------+-------------+----------+----------------+ Dist forearm                             not visualized  +-----------------+-------------+----------+----------------+ Wrist                                    not visualized  +-----------------+-------------+----------+----------------+ +--------------+-------------+----------+--------------+ Right Basilic Diameter (cm)Depth (cm)   Findings    +--------------+-------------+----------+--------------+ Prox upper arm                       not visualized +--------------+-------------+----------+--------------+ Wrist                                not visualized +--------------+-------------+----------+--------------+ +--------------+-------------+----------+--------------+ Left Cephalic Diameter (cm)Depth (cm)   Findings    +--------------+-------------+----------+--------------+ Prox upper arm                       not visualized +--------------+-------------+----------+--------------+ Wrist                                not visualized +--------------+-------------+----------+--------------+ +--------------+-------------+----------+--------------+ Left Basilic  Diameter (cm)Depth (cm)   Findings    +--------------+-------------+----------+--------------+ Prox upper arm                       not visualized +--------------+-------------+----------+--------------+ Wrist                                not visualized  +--------------+-------------+----------+--------------+ Summary: Right: Right cephalic vein non compressible and thrombus visualized        adjacent to IV in antecubital fossa. Right basilic vein not        visualized. Left: Left cephalic and basilic veins not visualized. *See table(s) above for measurements and observations.  Diagnosing physician: Monica Martinez MD Electronically signed by Monica Martinez MD on 08/20/2019 at 4:32:09 PM.    Final     Labs: BMET Recent Labs  Lab 08/16/19 1424 08/17/19 1633 08/17/19 2030 08/18/19 0725  08/19/19 0534 08/20/19 0656 08/21/19 0346  NA 137 136 138 137 138 137 140  K 5.6* 6.0* 5.2* 4.8 4.9 4.7 4.8  CL 104 105 107 105 102 105 108  CO2 20 20* 19* 18* 18* 18* 18*  GLUCOSE 83 80 50* 97 155* 100* 96  BUN 119* 131* 137* 131* 134* 135* 140*  CREATININE 6.52* 6.70* 6.54* 6.49* 6.49* 6.57* 6.92*  CALCIUM 10.5 9.8 10.0 9.8 9.9 9.2 9.2  PHOS  --   --   --  6.7*  --   --   --    CBC Recent Labs  Lab 08/16/19 1424 08/16/19 1424 08/17/19 1633 08/17/19 1633 08/18/19 0725 08/19/19 0534 08/20/19 0656 08/21/19 0346  WBC 5.0   < > 5.6   < > 5.3 5.1 3.1* 3.8*  NEUTROABS 3.1  --  3.5  --   --   --   --   --   HGB 9.5*   < > 9.4*   < > 9.9* 10.0* 8.7* 8.8*  HCT 29.3*   < > 29.9*   < > 30.7* 31.1* 27.3* 26.6*  MCV 86.4   < > 86.9   < > 85.5 86.4 84.8 82.6  PLT 128.0*   < > 112*   < > 108* 108* 86* 92*   < > = values in this interval not displayed.    Medications:    . albuterol  10 mg Nebulization Once  . atorvastatin  20 mg Oral Daily  . carvedilol  12.5 mg Oral BID  . Chlorhexidine Gluconate Cloth  6 each Topical Daily  . levothyroxine  25 mcg Oral QAC breakfast  . sodium bicarbonate  1,300 mg Oral BID  . sodium chloride flush  3 mL Intravenous Q12H      Otelia Santee, MD 08/21/2019, 8:02 AM

## 2019-08-22 DIAGNOSIS — I5022 Chronic systolic (congestive) heart failure: Secondary | ICD-10-CM

## 2019-08-22 DIAGNOSIS — N184 Chronic kidney disease, stage 4 (severe): Secondary | ICD-10-CM

## 2019-08-22 LAB — BASIC METABOLIC PANEL
Anion gap: 13 (ref 5–15)
BUN: 135 mg/dL — ABNORMAL HIGH (ref 8–23)
CO2: 18 mmol/L — ABNORMAL LOW (ref 22–32)
Calcium: 9.1 mg/dL (ref 8.9–10.3)
Chloride: 109 mmol/L (ref 98–111)
Creatinine, Ser: 6.15 mg/dL — ABNORMAL HIGH (ref 0.61–1.24)
GFR calc Af Amer: 9 mL/min — ABNORMAL LOW (ref >60)
GFR calc non Af Amer: 8 mL/min — ABNORMAL LOW (ref >60)
Glucose, Bld: 85 mg/dL (ref 70–99)
Potassium: 4.5 mmol/L (ref 3.5–5.1)
Sodium: 140 mmol/L (ref 135–145)

## 2019-08-22 LAB — SURGICAL PCR SCREEN
MRSA, PCR: NEGATIVE
Staphylococcus aureus: NEGATIVE

## 2019-08-22 MED ORDER — CEFAZOLIN SODIUM-DEXTROSE 2-4 GM/100ML-% IV SOLN
2.0000 g | INTRAVENOUS | Status: AC
  Administered 2019-08-23: 2 g via INTRAVENOUS
  Filled 2019-08-22: qty 100

## 2019-08-22 MED ORDER — TORSEMIDE 20 MG PO TABS
20.0000 mg | ORAL_TABLET | Freq: Every day | ORAL | Status: DC
  Administered 2019-08-22 – 2019-08-24 (×2): 20 mg via ORAL
  Filled 2019-08-22 (×2): qty 1

## 2019-08-22 NOTE — Plan of Care (Signed)
  Problem: Education: Goal: Knowledge of General Education information will improve Description Including pain rating scale, medication(s)/side effects and non-pharmacologic comfort measures Outcome: Progressing   

## 2019-08-22 NOTE — H&P (View-Only) (Signed)
Hospital Consult    Reason for Consult: New dialysis access evaluation Referring Physician: Neurology MRN #:  [id number]  History of Present Illness: This is a 79 y.o. male with history of stage IV chronic kidney disease, chronic systolic heart failure EF 20 to 25% with the ICD, anemia of chronic disease that vascular surgery has been consulted for new dialysis access placement.  He was ultimately admitted to the hospital at the direction of his PCP when he was noted to have worsening renal labs.  Nephrology has been following and consulted vascular surgery for dialysis access placement.  They have asked that we hold off on any tunneled catheter at this time.  Notes suggest possible bladder outlet obstruction as etiology.  He has continued to make urine.  Patient states he is right-handed.  He is currently retired.  No previous access in the past.  Does have an AICD implanted on his left chest wall.  No other tunnel catheters at this time.  Past Medical History:  Diagnosis Date  . Anxiety   . Automatic implantable cardioverter-defibrillator in situ    greg taylor  . CHF (congestive heart failure) ([place])    2000  . Diabetes mellitus    no meds  . Fatty liver   . Gout    "bout 2-3 months ago"-meds helped.  . Hyperlipidemia   . Hypertension   . Hyperthyroidism   . Kidney cysts   . PONV (postoperative nausea and vomiting)   . Renal insufficiency     Past Surgical History:  Procedure Laterality Date  . CARDIAC DEFIBRILLATOR PLACEMENT    . COLONOSCOPY WITH PROPOFOL N/A 10/03/2015   Procedure: COLONOSCOPY WITH PROPOFOL;  Surgeon: Ladene Artist, MD;  Location: [facility];  Service: Endoscopy;  Laterality: N/A;  . DIAGNOSTIC LAPAROSCOPY  01/27/2014   Dr Brantley Stage  . ENTEROSCOPY N/A 11/25/2013   Procedure: ENTEROSCOPY;  Surgeon: Ladene Artist, MD;  Location: [facility];  Service: Endoscopy;  Laterality: N/A;  . ICD,[place] [employer]    . LAPAROSCOPY N/A 01/27/2014   Procedure:  LAPAROSCOPY DIAGNOSTIC;  Surgeon: Joyice Faster. Cornett, MD;  Location: [facility];  Service: General;  Laterality: N/A;  . polyp removal throat     difficulty speaking    Allergies  Allergen Reactions  . Sulfonamide Derivatives Itching and Rash    Prior to Admission medications   Medication Sig Start Date End Date Taking? Authorizing Provider  allopurinol (ZYLOPRIM) 100 MG tablet Take 1 tablet (100 mg total) by mouth daily. 04/16/19  Yes [name], MD  atorvastatin (LIPITOR) 20 MG tablet Take 1 tablet (20 mg total) by mouth daily. 04/16/19  Yes [name], MD  calcitRIOL (ROCALTROL) 0.5 MCG capsule Take 1 capsule (0.5 mcg total) by mouth daily. 04/16/19  Yes [name], MD  carvedilol (COREG) 12.5 MG tablet Take 1 tablet (12.5 mg total) by mouth 2 (two) times daily. 04/16/19  Yes [name], MD  Cholecalciferol (VITAMIN D3) 5000 units CAPS Take 1 capsule by mouth daily.   Yes [provider]  levothyroxine (SYNTHROID) 25 MCG tablet Take 1 tablet (25 mcg total) by mouth daily before breakfast. 04/16/19  Yes [name], MD  lisinopril (ZESTRIL) 20 MG tablet Take 1 tablet (20 mg total) by mouth daily. 04/16/19  Yes [name], MD  sertraline (ZOLOFT) 50 MG tablet Take 1 tablet (50 mg total) by mouth daily. 04/16/19  Yes [name], MD  torsemide (DEMADEX) 20 MG tablet Take 1  tablet (20 mg total) by mouth daily. 04/16/19  Yes [name], MD  amLODipine (NORVASC) 5 MG tablet Take 1 tablet (5 mg total) by mouth daily. Patient not taking: Reported on 08/17/2019 04/16/19   [name], MD  atenolol (TENORMIN) 50 MG tablet Take 75 mg by mouth daily.    09/03/11  [provider]  Insulin Glargine (LANTUS SOLOSTAR ) Inject into the skin as directed.    09/03/11  [provider]    Social History   Socioeconomic History  . Marital status: Married    Spouse name: Not on file  . Number of children: 4    . Years of education: Not on file  . Highest education level: Not on file  Occupational History  . Occupation: Retired    Fish farm manager: RETIRED  Tobacco Use  . Smoking status: Former Smoker    Types: Cigarettes    Quit date: 11/26/1998    Years since quitting: 20.7  . Smokeless tobacco: Never Used  Substance and Sexual Activity  . Alcohol use: No    Alcohol/week: 0.0 standard drinks    Comment: quit  2008  . Drug use: No  . Sexual activity: Not on file  Other Topics Concern  . Not on file  Social History Narrative  . Not on file   Social Determinants of Health   Financial Resource Strain:   . Difficulty of Paying Living Expenses: Not on file  Food Insecurity:   . Worried About Charity fundraiser in the Last Year: Not on file  . Ran Out of Food in the Last Year: Not on file  Transportation Needs:   . Lack of Transportation (Medical): Not on file  . Lack of Transportation (Non-Medical): Not on file  Physical Activity:   . Days of Exercise per Week: Not on file  . Minutes of Exercise per Session: Not on file  Stress:   . Feeling of Stress : Not on file  Social Connections:   . Frequency of Communication with Friends and Family: Not on file  . Frequency of Social Gatherings with Friends and Family: Not on file  . Attends Religious Services: Not on file  . Active Member of Clubs or Organizations: Not on file  . Attends Archivist Meetings: Not on file  . Marital Status: Not on file  Intimate Partner Violence:   . Fear of Current or Ex-Partner: Not on file  . Emotionally Abused: Not on file  . Physically Abused: Not on file  . Sexually Abused: Not on file     Family History  Problem Relation Age of Onset  . Stomach cancer Mother   . Alzheimer's disease Mother   . Diabetes Father   . Heart disease Father   . Liver disease Father   . Kidney disease Father     ROS: [x]  Positive   [ ]  Negative   [ ]  All sytems reviewed and are  negative  Cardiovascular: []  chest pain/pressure []  palpitations []  SOB lying flat []  DOE []  pain in legs while walking []  pain in legs at rest []  pain in legs at night []  non-healing ulcers []  hx of DVT []  swelling in legs  Pulmonary: []  productive cough []  asthma/wheezing []  home O2  Neurologic: []  weakness in []  arms []  legs []  numbness in []  arms []  legs []  hx of CVA []  mini stroke [] difficulty speaking or slurred speech []  temporary loss of vision in one eye []  dizziness  Hematologic: []  hx of cancer []  bleeding problems []  problems with blood clotting easily  Endocrine:   []  diabetes []  thyroid disease  GI []  vomiting blood []  blood in stool  GU: []  CKD/renal failure []  HD--[]  M/W/F or []  T/T/S []  burning with urination []  blood in urine  Psychiatric: []  anxiety []  depression  Musculoskeletal: []  arthritis []  joint pain  Integumentary: []  rashes []  ulcers  Constitutional: []  fever []  chills   Physical Examination  Vitals:   08/22/19 0515 08/22/19 0844  BP: 123/63 130/68  Pulse: 66 69  Resp:  17  Temp: 98.5 F (36.9 C) 98.2 F (36.8 C)  SpO2: 96% 100%   Body mass index is 25.68 kg/m.  General:  WDWN in NAD Gait: Not observed HENT: WNL, normocephalic Pulmonary: normal non-labored breathing, without Rales, rhonchi,  wheezing Cardiac: regular, without  Murmurs, rubs or gallops Abdomen: soft, NT/ND, no masses Vascular Exam/Pulses: Right radial 2+ palpable Left radial 2+ palpable Right left brachial 2+ palpable Scar to right antecubitum from old football injury Extremities: without ischemic changes, without Gangrene , without cellulitis; without open wounds;  Musculoskeletal: no muscle wasting or atrophy  Neurologic: A&O X 3; Appropriate Affect ; SENSATION: normal; MOTOR FUNCTION:  moving all extremities equally. Speech is fluent/normal   CBC    Component Value Date/Time   WBC 3.8 (L) 08/21/2019 0346   RBC 3.22 (L)  08/21/2019 0346   HGB 8.8 (L) 08/21/2019 0346   HCT 26.6 (L) 08/21/2019 0346   PLT 92 (L) 08/21/2019 0346   MCV 82.6 08/21/2019 0346   MCH 27.3 08/21/2019 0346   MCHC 33.1 08/21/2019 0346   RDW 14.2 08/21/2019 0346   LYMPHSABS 1.2 08/17/2019 1633   MONOABS 0.5 08/17/2019 1633   EOSABS 0.3 08/17/2019 1633   BASOSABS 0.0 08/17/2019 1633    BMET    Component Value Date/Time   NA 140 08/22/2019 0426   K 4.5 08/22/2019 0426   CL 109 08/22/2019 0426   CO2 18 (L) 08/22/2019 0426   GLUCOSE 85 08/22/2019 0426   GLUCOSE 81 07/06/2010 0000   BUN 135 (H) 08/22/2019 0426   CREATININE 6.15 (H) 08/22/2019 0426   CREATININE 3.63 (H) 07/04/2017 1555   CALCIUM 9.1 08/22/2019 0426   GFRNONAA 8 (L) 08/22/2019 0426   GFRAA 9 (L) 08/22/2019 0426    COAGS: Lab Results  Component Value Date   INR 1.0 09/05/2014     Non-Invasive Vascular Imaging:     I independently reviewed his vein mapping and his right cephalic vein appears thrombosed with no visualized right basilic and no visualized left basilic or cephalic.  ASSESSMENT/PLAN: This is a 79 y.o. male with stage IV CKD progressing toward end-stage renal disease that vascular surgery has been consulted for permanent dialysis access.  Given that he has a left chest wall AICD suspect he likely has left central stenosis or occlusion and have subsequently recommended placement in the right arm.  Unfortunately his right cephalic vein is thrombosed and he has no other superficial vein in the right arm based on vein mapping.  I did discuss likely right arm graft placement with either forearm versus upper arm graft.  Risk benefits were discussed in detail.  I did talk to nephrology they want to hold off on any catheter placement at this time.  Plan for OR tomorrow.  Please keep NPO after midnight.  Consent ordered.  Marty Heck, MD [facility] Office: [phone]

## 2019-08-22 NOTE — Progress Notes (Deleted)
New Admission Note: Patient admitted from the [facility] ED  Arrival Method: via stretcher Mental Orientation: A and O x 4 Telemetry: discontinued Assessment: Completed Skin: intact, hemorrhoids present IV: R fa and RAC infusing 1 unit PRBC's Pain: Denies Tubes: N/A Safety Measures: Safety Fall Prevention Plan has been given, discussed and signed Admission: Completed [facility] Orientation: Patient has been orientated to the room, unit and staff.  Family: None at bedside  Orders have been reviewed and implemented. Will continue to monitor the patient. Call light has been placed within reach and bed alarm has been activated.   Dorthey Sawyer, RN

## 2019-08-22 NOTE — Progress Notes (Signed)
[facility] Progress Note   79 y.o.male HTN, DM, HLD, NICM s/p ICD placement, and CKD stage IV (followed by Dr. Joelyn Oms in our office) who was seen for routine follow up by his PCP yesterday andnoted to have AKI and hyperkalemia. He was instructed to to the ED to be further evaluated. In the ED today, his labs were rechecked with worsening renal function, azotemia, and hyperkalemia. He did lose his wife of over 50 years several weeks ago (she was on dialysis for a short time but did not do well). He denies any NSAIDs or COX-II I's but has been on lisinopril and torsemide prior to admission.  Assessment/ Plan:   1. AKI/CKD stage IV- suspect bladder outlet obstruction given palpable suprapubic mass and bladder scan with 675 ml. Renal US- technically difficult, small kidneys but no hydro. 1. Continue holdinglisinopril; restart  Torsemide;   Continue NaHCO3 2 tabs bid. 2. No uremic symptoms so no urgent need for dialysis; not much improvement in renal  function despite  insertion but UOP much better. Hopefully this is a post  Obstructive diuresis. 3. Question is whether damage has already been done.  D/w [facility] Dr. Carlis Abbott and he will plan for a  permanent access and we will hold off on a TC for now. Tentative plan is permanent access placement and if numbers remain stable with good UOP to f/u closely with [facility] in 2 weeks with initiation of dialysis non emergently through the perm access in a few months. 2. Hyperkalemia- no ECG changes. Pt treated in the ED with bicarb, insulin/D5, lokelma but  helped  with relief of bladder outlet obstruction and foley catheter placement. 3. Anemia of CKD stage IV- TSAT 31! With Ferritin of 244 -> given Aranesp 60 mcg sq 3/5 4. HTN- stable and follow off of lisinopril. May need to start hydralazine or amlodipine if BP rises. We can always rechallenge with Lisinopril at outpt clinic ([facility]) if he returns to where he was before. 5. DM- per  primary 6. NICMP s/p AICD- stable 7. CKD stage IV- felt to be due to DM and followed closely by Dr. Joelyn Oms. He is leaning towards PD if he needs to start dialysis but has agreed to backup avf placement. 8. Thrombocytopenia- continue to follow 9. Right low back pain- possibly related to bladder outlet obstruction. Renal US - small kidneys but no hydro.  Subjective:   Denies n/v/anorexia/ dyspnea/ CP   Objective:   BP 123/63 (BP Location: Left Arm)   Pulse 66   Temp 98.5 F (36.9 C) (Oral)   Resp 16   Ht 5\' 8"  (1.727 m)   Wt 76.6 kg   SpO2 96%   BMI 25.68 kg/m   Intake/Output Summary (Last 24 hours) at 08/22/2019 0747 Last data filed at 08/22/2019 0600 Gross per 24 hour  Intake 300 ml  Output 2050 ml  Net -1750 ml   Weight change: 0 kg  Physical Exam: General appearance:NAD, NCAT Resp:clear to auscultation bilaterallywith no rales noted Cardio:regular rate and rhythm, no rub andIII/VI SEM around precordium KW:IOXB, nontender, +BS Extremities:extremities normal, atraumatic, no cyanosis or edema Neuro: no asterixis GU: foley to gravity  Imaging: No results found.  Labs: BMET Recent Labs  Lab 08/17/19 1633 08/17/19 2030 08/18/19 0725 08/19/19 0534 08/20/19 0656 08/21/19 0346 08/22/19 0426  NA 136 138 137 138 137 140 140  K 6.0* 5.2* 4.8 4.9 4.7 4.8 4.5  CL 105 107 105 102 105 108 109  CO2 20* 19*  18* 18* 18* 18* 18*  GLUCOSE 80 50* 97 155* 100* 96 85  BUN 131* 137* 131* 134* 135* 140* 135*  CREATININE 6.70* 6.54* 6.49* 6.49* 6.57* 6.92* 6.15*  CALCIUM 9.8 10.0 9.8 9.9 9.2 9.2 9.1  PHOS  --   --  6.7*  --   --   --   --    CBC Recent Labs  Lab 08/16/19 1424 08/16/19 1424 08/17/19 1633 08/17/19 1633 08/18/19 0725 08/19/19 0534 08/20/19 0656 08/21/19 0346  WBC 5.0   < > 5.6   < > 5.3 5.1 3.1* 3.8*  NEUTROABS 3.1  --  3.5  --   --   --   --   --   HGB 9.5*   < > 9.4*   < > 9.9* 10.0* 8.7* 8.8*  HCT 29.3*   < > 29.9*   < > 30.7* 31.1* 27.3*  26.6*  MCV 86.4   < > 86.9   < > 85.5 86.4 84.8 82.6  PLT 128.0*   < > 112*   < > 108* 108* 86* 92*   < > = values in this interval not displayed.    Medications:    . albuterol  10 mg Nebulization Once  . atorvastatin  20 mg Oral Daily  . carvedilol  12.5 mg Oral BID  . Chlorhexidine Gluconate Cloth  6 each Topical Daily  . levothyroxine  25 mcg Oral QAC breakfast  . sodium bicarbonate  1,300 mg Oral BID  . sodium chloride flush  3 mL Intravenous Q12H      Otelia Santee, MD 08/22/2019, 7:47 AM

## 2019-08-22 NOTE — Progress Notes (Signed)
PROGRESS NOTE    Tyler Hahn  QMV:[id number] DOB: 11/02/1940 DOA: 08/17/2019 PCP: [name], MD     Brief Narrative:  79 year old man admitted from home on 3/2 after being directed here by his PCP due to abnormal labs.  His past medical history significant for stage IV chronic kidney disease with a baseline creatinine between 3.4 and 3.7, chronic systolic heart failure with an ejection fraction of 20 to 25% status post AICD placement, hypothyroidism, anemia of chronic disease with a baseline hemoglobin between 9 and 11.  He has been complaining of generalized weakness for the past 2 to 3 days, his wife recently passed away 2 weeks ago.  He has been having some depressed mood which has led to decreased oral intake.  He had labs drawn by his PCP and was asked to come to the hospital due to worsening renal function and hyperkalemia.   Assessment & Plan:   Principal Problem:   Acute renal failure (ARF) (HCC) Active Problems:   SYSTOLIC HEART FAILURE, CHRONIC   Hypothyroid   Hyperkalemia   History of anemia due to CKD   Acute on chronic kidney disease stage IV -Baseline creatinine between 3.5 and 3.7. -Creatinine on admission was 6.54, slight decreased to 6.49 3/3, 6.49 3/4. Up slightly to 6.57 on 3/5. Further increased to 6.92 on 3/6. 6.15 on 3/7. -Nephrology is on board. -Foley placed 3/5. -Continue holding nephrotoxic agents including lisinopril and torsemide as well as any NSAIDs. -Vein mapping complete. -Permanent access planned for tomorrow.   Hyperkalemia -With no EKG changes on admission. -He was treated with insulin/D5, Lokelma, bicarbonate. -Potassium is 4.5 today.  Anemia of chronic disease -Due to advanced renal dysfunction. -Hemoglobin ws 8.8 on 3/6. -Continue to follow and transfuse if <7.0.  Generalized weakness -Likely due to acute on chronic kidney disease. -Recent TSH was normal at 2.91. -There is no evidence of an acute infectious process at  this time. -He remains on Zoloft for his depression. -PT pending; OT: HH with supervision  Hypothyroidism -TSH is normal, continue home dose of Synthroid.  Chronic systolic heart failure -With the most recent echo from April 2014 at which time ejection fraction was 20 to 25% -This appears stable and compensated at this time. -Outpatient medications include Coreg, lisinopril and torsemide.    DVT prophylaxis: SCDs Code Status: Full code Family Communication: patient only Disposition Plan: Await improvement in renal function to determine if dialysis will be needed prior to DC. HD access placement planned for 3/8.  Consultants:   Nephrology  Procedures:   None  Antimicrobials:  Anti-infectives (From admission, onward)   Start     Dose/Rate Route Frequency Ordered Stop   08/23/19 0600  ceFAZolin (ANCEF) IVPB 2g/100 mL premix     2 g 200 mL/hr over 30 Minutes Intravenous To Short Stay 08/22/19 0916 08/24/19 0600       Subjective: Lying in bed, awake watching TV no new complaints. States he feels well just a little weak.  Objective: Vitals:   08/21/19 2124 08/22/19 0500 08/22/19 0515 08/22/19 0844  BP: (!) 143/70  123/63 130/68  Pulse: 62  66 69  Resp:    17  Temp: 98.5 F (36.9 C)  98.5 F (36.9 C) 98.2 F (36.8 C)  TempSrc: Oral  Oral Oral  SpO2: 99%  96% 100%  Weight:  76.6 kg    Height:        Intake/Output Summary (Last 24 hours) at 08/22/2019 2841 Last data filed  at 08/22/2019 0600 Gross per 24 hour  Intake 300 ml  Output 1700 ml  Net -1400 ml   Filed Weights   08/20/19 1901 08/21/19 0500 08/22/19 0500  Weight: 76.6 kg 76.6 kg 76.6 kg    Examination:  General exam: Alert, awake, oriented x 3 Respiratory system: Clear to auscultation. Respiratory effort normal. Cardiovascular system:RRR. No murmurs, rubs, gallops. Gastrointestinal system: Abdomen is nondistended, soft and nontender. No organomegaly or masses felt. Normal bowel sounds heard. Central  nervous system: Alert and oriented. No focal neurological deficits. Extremities: 1+ pitting edema bilaterally., +pedal pulses Skin: No rashes, lesions or ulcers Psychiatry: Judgement and insight appear normal. Mood & affect appropriate.     Data Reviewed: I have personally reviewed following labs and imaging studies  CBC: Recent Labs  Lab 08/16/19 1424 08/16/19 1424 08/17/19 1633 08/18/19 0725 08/19/19 0534 08/20/19 0656 08/21/19 0346  WBC 5.0   < > 5.6 5.3 5.1 3.1* 3.8*  NEUTROABS 3.1  --  3.5  --   --   --   --   HGB 9.5*   < > 9.4* 9.9* 10.0* 8.7* 8.8*  HCT 29.3*   < > 29.9* 30.7* 31.1* 27.3* 26.6*  MCV 86.4   < > 86.9 85.5 86.4 84.8 82.6  PLT 128.0*   < > 112* 108* 108* 86* 92*   < > = values in this interval not displayed.   Basic Metabolic Panel: Recent Labs  Lab 08/17/19 2030 08/17/19 2030 08/18/19 0725 08/19/19 0534 08/20/19 0656 08/21/19 0346 08/22/19 0426  NA 138   < > 137 138 137 140 140  K 5.2*   < > 4.8 4.9 4.7 4.8 4.5  CL 107   < > 105 102 105 108 109  CO2 19*   < > 18* 18* 18* 18* 18*  GLUCOSE 50*   < > 97 155* 100* 96 85  BUN 137*   < > 131* 134* 135* 140* 135*  CREATININE 6.54*   < > 6.49* 6.49* 6.57* 6.92* 6.15*  CALCIUM 10.0   < > 9.8 9.9 9.2 9.2 9.1  MG 1.6*  --  1.4*  --   --   --   --   PHOS  --   --  6.7*  --   --   --   --    < > = values in this interval not displayed.   GFR: Estimated Creatinine Clearance: 9.7 mL/min (A) (by C-G formula based on SCr of 6.15 mg/dL (H)). Liver Function Tests: Recent Labs  Lab 08/16/19 1424 08/17/19 1633 08/18/19 0725  AST 11 18  --   ALT 11 15  --   ALKPHOS 51 49  --   BILITOT 0.3 0.6  --   PROT 7.6 8.1  --   ALBUMIN 4.3 4.2 3.8   No results for input(s): LIPASE, AMYLASE in the last 168 hours. No results for input(s): AMMONIA in the last 168 hours. Coagulation Profile: No results for input(s): INR, PROTIME in the last 168 hours. Cardiac Enzymes: No results for input(s): CKTOTAL, CKMB,  CKMBINDEX, TROPONINI in the last 168 hours. BNP (last 3 results) No results for input(s): PROBNP in the last 8760 hours. HbA1C: No results for input(s): HGBA1C in the last 72 hours. CBG: Recent Labs  Lab 08/18/19 0858  GLUCAP 80   Lipid Profile: No results for input(s): CHOL, HDL, LDLCALC, TRIG, CHOLHDL, LDLDIRECT in the last 72 hours. Thyroid Function Tests: No results for input(s): TSH, T4TOTAL, FREET4, T3FREE,  THYROIDAB in the last 72 hours. Anemia Panel: No results for input(s): VITAMINB12, FOLATE, FERRITIN, TIBC, IRON, RETICCTPCT in the last 72 hours. Urine analysis:    Component Value Date/Time   COLORURINE STRAW (A) 08/17/2019 1847   APPEARANCEUR CLEAR 08/17/2019 1847   LABSPEC 1.006 08/17/2019 1847   PHURINE 5.0 08/17/2019 1847   GLUCOSEU NEGATIVE 08/17/2019 1847   HGBUR NEGATIVE 08/17/2019 1847   BILIRUBINUR NEGATIVE 08/17/2019 1847   BILIRUBINUR negative 07/28/2014 1419   KETONESUR NEGATIVE 08/17/2019 1847   PROTEINUR NEGATIVE 08/17/2019 1847   UROBILINOGEN 0.2 07/28/2014 1419   NITRITE NEGATIVE 08/17/2019 1847   LEUKOCYTESUR NEGATIVE 08/17/2019 1847   Sepsis Labs: @LABRCNTIP (procalcitonin:4,lacticidven:4)  ) Recent Results (from the past 240 hour(s))  SARS CORONAVIRUS 2 (TAT 6-24 HRS) Nasopharyngeal Nasopharyngeal Swab     Status: None   Collection Time: 08/17/19  7:59 PM   Specimen: Nasopharyngeal Swab  Result Value Ref Range Status   SARS Coronavirus 2 NEGATIVE NEGATIVE Final    Comment: (NOTE) SARS-CoV-2 target nucleic acids are NOT DETECTED. The SARS-CoV-2 RNA is generally detectable in upper and lower respiratory specimens during the acute phase of infection. Negative results do not preclude SARS-CoV-2 infection, do not rule out co-infections with other pathogens, and should not be used as the sole basis for treatment or other patient management decisions. Negative results must be combined with clinical observations, patient history, and  epidemiological information. The expected result is Negative. Fact Sheet for Patients: SugarRoll.be Fact Sheet for Healthcare Providers: https://www.woods-mathews.com/ This test is not yet approved or cleared by the [place] FDA and  has been authorized for detection and/or diagnosis of SARS-CoV-2 by FDA under an Emergency Use Authorization (EUA). This EUA will remain  in effect (meaning this test can be used) for the duration of the COVID-19 declaration under Section 56 4(b)(1) of the Act, 21 U.S.C. section 360bbb-3(b)(1), unless the authorization is terminated or revoked sooner. Performed at [facility], [place] [address], [place], [place] [address]   MRSA PCR Screening     Status: None   Collection Time: 08/18/19  1:27 AM   Specimen: Nasal Mucosa; Nasopharyngeal  Result Value Ref Range Status   MRSA by PCR NEGATIVE NEGATIVE Final    Comment:        The GeneXpert MRSA Assay (FDA approved for NASAL specimens only), is one component of a comprehensive MRSA colonization surveillance program. It is not intended to diagnose MRSA infection nor to guide or monitor treatment for MRSA infections. Performed at [facility], [place] [address], [place], [place] [address]          Radiology Studies: No results found.      Scheduled Meds: . albuterol  10 mg Nebulization Once  . atorvastatin  20 mg Oral Daily  . carvedilol  12.5 mg Oral BID  . Chlorhexidine Gluconate Cloth  6 each Topical Daily  . levothyroxine  25 mcg Oral QAC breakfast  . sodium bicarbonate  1,300 mg Oral BID  . sodium chloride flush  3 mL Intravenous Q12H  . torsemide  20 mg Oral Daily   Continuous Infusions: . [START ON 08/23/2019]  ceFAZolin (ANCEF) IV       LOS: 5 days    Time spent: 25 minutes.. Greater than 50% of this time was spent in direct contact with the patient, coordinating care and discussing relevant ongoing clinical  issues.     Lelon Frohlich, MD [facility] Pager [phone]  If 7PM-7AM, please contact  night-coverage [email or web address] Password [facility] 08/22/2019, 9:56 AM

## 2019-08-22 NOTE — Consult Note (Signed)
Hospital Consult    Reason for Consult: New dialysis access evaluation Referring Physician: Neurology MRN #:  [id number]  History of Present Illness: This is a 79 y.o. male with history of stage IV chronic kidney disease, chronic systolic heart failure EF 20 to 25% with the ICD, anemia of chronic disease that vascular surgery has been consulted for new dialysis access placement.  He was ultimately admitted to the hospital at the direction of his PCP when he was noted to have worsening renal labs.  Nephrology has been following and consulted vascular surgery for dialysis access placement.  They have asked that we hold off on any tunneled catheter at this time.  Notes suggest possible bladder outlet obstruction as etiology.  He has continued to make urine.  Patient states he is right-handed.  He is currently retired.  No previous access in the past.  Does have an AICD implanted on his left chest wall.  No other tunnel catheters at this time.  Past Medical History:  Diagnosis Date  . Anxiety   . Automatic implantable cardioverter-defibrillator in situ    greg taylor  . CHF (congestive heart failure) ([place])    2000  . Diabetes mellitus    no meds  . Fatty liver   . Gout    "bout 2-3 months ago"-meds helped.  . Hyperlipidemia   . Hypertension   . Hyperthyroidism   . Kidney cysts   . PONV (postoperative nausea and vomiting)   . Renal insufficiency     Past Surgical History:  Procedure Laterality Date  . CARDIAC DEFIBRILLATOR PLACEMENT    . COLONOSCOPY WITH PROPOFOL N/A 10/03/2015   Procedure: COLONOSCOPY WITH PROPOFOL;  Surgeon: Ladene Artist, MD;  Location: [facility];  Service: Endoscopy;  Laterality: N/A;  . DIAGNOSTIC LAPAROSCOPY  01/27/2014   Dr Brantley Stage  . ENTEROSCOPY N/A 11/25/2013   Procedure: ENTEROSCOPY;  Surgeon: Ladene Artist, MD;  Location: [facility];  Service: Endoscopy;  Laterality: N/A;  . ICD,[place] [employer]    . LAPAROSCOPY N/A 01/27/2014   Procedure:  LAPAROSCOPY DIAGNOSTIC;  Surgeon: Joyice Faster. Cornett, MD;  Location: [facility];  Service: General;  Laterality: N/A;  . polyp removal throat     difficulty speaking    Allergies  Allergen Reactions  . Sulfonamide Derivatives Itching and Rash    Prior to Admission medications   Medication Sig Start Date End Date Taking? Authorizing Provider  allopurinol (ZYLOPRIM) 100 MG tablet Take 1 tablet (100 mg total) by mouth daily. 04/16/19  Yes [name], MD  atorvastatin (LIPITOR) 20 MG tablet Take 1 tablet (20 mg total) by mouth daily. 04/16/19  Yes [name], MD  calcitRIOL (ROCALTROL) 0.5 MCG capsule Take 1 capsule (0.5 mcg total) by mouth daily. 04/16/19  Yes [name], MD  carvedilol (COREG) 12.5 MG tablet Take 1 tablet (12.5 mg total) by mouth 2 (two) times daily. 04/16/19  Yes [name], MD  Cholecalciferol (VITAMIN D3) 5000 units CAPS Take 1 capsule by mouth daily.   Yes [provider]  levothyroxine (SYNTHROID) 25 MCG tablet Take 1 tablet (25 mcg total) by mouth daily before breakfast. 04/16/19  Yes [name], MD  lisinopril (ZESTRIL) 20 MG tablet Take 1 tablet (20 mg total) by mouth daily. 04/16/19  Yes [name], MD  sertraline (ZOLOFT) 50 MG tablet Take 1 tablet (50 mg total) by mouth daily. 04/16/19  Yes [name], MD  torsemide (DEMADEX) 20 MG tablet Take 1  tablet (20 mg total) by mouth daily. 04/16/19  Yes [name], MD  amLODipine (NORVASC) 5 MG tablet Take 1 tablet (5 mg total) by mouth daily. Patient not taking: Reported on 08/17/2019 04/16/19   [name], MD  atenolol (TENORMIN) 50 MG tablet Take 75 mg by mouth daily.    09/03/11  [provider]  Insulin Glargine (LANTUS SOLOSTAR [place]) Inject into the skin as directed.    09/03/11  [provider]    Social History   Socioeconomic History  . Marital status: Married    Spouse name: Not on file  . Number of children: 4    . Years of education: Not on file  . Highest education level: Not on file  Occupational History  . Occupation: Retired    Fish farm manager: RETIRED  Tobacco Use  . Smoking status: Former Smoker    Types: Cigarettes    Quit date: 11/26/1998    Years since quitting: 20.7  . Smokeless tobacco: Never Used  Substance and Sexual Activity  . Alcohol use: No    Alcohol/week: 0.0 standard drinks    Comment: quit  2008  . Drug use: No  . Sexual activity: Not on file  Other Topics Concern  . Not on file  Social History Narrative  . Not on file   Social Determinants of Health   Financial Resource Strain:   . Difficulty of Paying Living Expenses: Not on file  Food Insecurity:   . Worried About Charity fundraiser in the Last Year: Not on file  . Ran Out of Food in the Last Year: Not on file  Transportation Needs:   . Lack of Transportation (Medical): Not on file  . Lack of Transportation (Non-Medical): Not on file  Physical Activity:   . Days of Exercise per Week: Not on file  . Minutes of Exercise per Session: Not on file  Stress:   . Feeling of Stress : Not on file  Social Connections:   . Frequency of Communication with Friends and Family: Not on file  . Frequency of Social Gatherings with Friends and Family: Not on file  . Attends Religious Services: Not on file  . Active Member of Clubs or Organizations: Not on file  . Attends Archivist Meetings: Not on file  . Marital Status: Not on file  Intimate Partner Violence:   . Fear of Current or Ex-Partner: Not on file  . Emotionally Abused: Not on file  . Physically Abused: Not on file  . Sexually Abused: Not on file     Family History  Problem Relation Age of Onset  . Stomach cancer Mother   . Alzheimer's disease Mother   . Diabetes Father   . Heart disease Father   . Liver disease Father   . Kidney disease Father     ROS: [x]  Positive   [ ]  Negative   [ ]  All sytems reviewed and are  negative  Cardiovascular: []  chest pain/pressure []  palpitations []  SOB lying flat []  DOE []  pain in legs while walking []  pain in legs at rest []  pain in legs at night []  non-healing ulcers []  hx of DVT []  swelling in legs  Pulmonary: []  productive cough []  asthma/wheezing []  home O2  Neurologic: []  weakness in []  arms []  legs []  numbness in []  arms []  legs []  hx of CVA []  mini stroke [] difficulty speaking or slurred speech []  temporary loss of vision in one eye []  dizziness  Hematologic: []  hx of cancer []  bleeding problems []  problems with blood clotting easily  Endocrine:   []  diabetes []  thyroid disease  GI []  vomiting blood []  blood in stool  GU: []  CKD/renal failure []  HD--[]  M/W/F or []  T/T/S []  burning with urination []  blood in urine  Psychiatric: []  anxiety []  depression  Musculoskeletal: []  arthritis []  joint pain  Integumentary: []  rashes []  ulcers  Constitutional: []  fever []  chills   Physical Examination  Vitals:   08/22/19 0515 08/22/19 0844  BP: 123/63 130/68  Pulse: 66 69  Resp:  17  Temp: 98.5 F (36.9 C) 98.2 F (36.8 C)  SpO2: 96% 100%   Body mass index is 25.68 kg/m.  General:  WDWN in NAD Gait: Not observed HENT: WNL, normocephalic Pulmonary: normal non-labored breathing, without Rales, rhonchi,  wheezing Cardiac: regular, without  Murmurs, rubs or gallops Abdomen: soft, NT/ND, no masses Vascular Exam/Pulses: Right radial 2+ palpable Left radial 2+ palpable Right left brachial 2+ palpable Scar to right antecubitum from old football injury Extremities: without ischemic changes, without Gangrene , without cellulitis; without open wounds;  Musculoskeletal: no muscle wasting or atrophy  Neurologic: A&O X 3; Appropriate Affect ; SENSATION: normal; MOTOR FUNCTION:  moving all extremities equally. Speech is fluent/normal   CBC    Component Value Date/Time   WBC 3.8 (L) 08/21/2019 0346   RBC 3.22 (L)  08/21/2019 0346   HGB 8.8 (L) 08/21/2019 0346   HCT 26.6 (L) 08/21/2019 0346   PLT 92 (L) 08/21/2019 0346   MCV 82.6 08/21/2019 0346   MCH 27.3 08/21/2019 0346   MCHC 33.1 08/21/2019 0346   RDW 14.2 08/21/2019 0346   LYMPHSABS 1.2 08/17/2019 1633   MONOABS 0.5 08/17/2019 1633   EOSABS 0.3 08/17/2019 1633   BASOSABS 0.0 08/17/2019 1633    BMET    Component Value Date/Time   NA 140 08/22/2019 0426   K 4.5 08/22/2019 0426   CL 109 08/22/2019 0426   CO2 18 (L) 08/22/2019 0426   GLUCOSE 85 08/22/2019 0426   GLUCOSE 81 07/06/2010 0000   BUN 135 (H) 08/22/2019 0426   CREATININE 6.15 (H) 08/22/2019 0426   CREATININE 3.63 (H) 07/04/2017 1555   CALCIUM 9.1 08/22/2019 0426   GFRNONAA 8 (L) 08/22/2019 0426   GFRAA 9 (L) 08/22/2019 0426    COAGS: Lab Results  Component Value Date   INR 1.0 09/05/2014     Non-Invasive Vascular Imaging:     I independently reviewed his vein mapping and his right cephalic vein appears thrombosed with no visualized right basilic and no visualized left basilic or cephalic.  ASSESSMENT/PLAN: This is a 79 y.o. male with stage IV CKD progressing toward end-stage renal disease that vascular surgery has been consulted for permanent dialysis access.  Given that he has a left chest wall AICD suspect he likely has left central stenosis or occlusion and have subsequently recommended placement in the right arm.  Unfortunately his right cephalic vein is thrombosed and he has no other superficial vein in the right arm based on vein mapping.  I did discuss likely right arm graft placement with either forearm versus upper arm graft.  Risk benefits were discussed in detail.  I did talk to nephrology they want to hold off on any catheter placement at this time.  Plan for OR tomorrow.  Please keep NPO after midnight.  Consent ordered.  Marty Heck, MD [facility] Office: [phone]

## 2019-08-23 ENCOUNTER — Inpatient Hospital Stay (HOSPITAL_COMMUNITY): Payer: Medicare HMO | Admitting: Certified Registered Nurse Anesthetist

## 2019-08-23 ENCOUNTER — Encounter (HOSPITAL_COMMUNITY): Payer: Self-pay | Admitting: Internal Medicine

## 2019-08-23 ENCOUNTER — Encounter (HOSPITAL_COMMUNITY)
Admission: EM | Disposition: A | Discharge status: Home Health | Payer: Self-pay | Source: Home / Self Care | Attending: Internal Medicine

## 2019-08-23 HISTORY — PX: AV FISTULA PLACEMENT: SHX1204

## 2019-08-23 LAB — BASIC METABOLIC PANEL
Anion gap: 15 (ref 5–15)
BUN: 135 mg/dL — ABNORMAL HIGH (ref 8–23)
CO2: 18 mmol/L — ABNORMAL LOW (ref 22–32)
Calcium: 9.3 mg/dL (ref 8.9–10.3)
Chloride: 108 mmol/L (ref 98–111)
Creatinine, Ser: 6.22 mg/dL — ABNORMAL HIGH (ref 0.61–1.24)
GFR calc Af Amer: 9 mL/min — ABNORMAL LOW (ref >60)
GFR calc non Af Amer: 8 mL/min — ABNORMAL LOW (ref >60)
Glucose, Bld: 105 mg/dL — ABNORMAL HIGH (ref 70–99)
Potassium: 4.9 mmol/L (ref 3.5–5.1)
Sodium: 141 mmol/L (ref 135–145)

## 2019-08-23 LAB — GLUCOSE, CAPILLARY
Glucose-Capillary: 84 mg/dL (ref 70–99)
Glucose-Capillary: 97 mg/dL (ref 70–99)

## 2019-08-23 SURGERY — ARTERIOVENOUS (AV) FISTULA CREATION
Anesthesia: Monitor Anesthesia Care | Site: Arm Upper | Laterality: Right

## 2019-08-23 MED ORDER — SODIUM CHLORIDE 0.9 % IV SOLN
INTRAVENOUS | Status: AC
  Filled 2019-08-23: qty 1.2

## 2019-08-23 MED ORDER — HYDROCODONE-ACETAMINOPHEN 5-325 MG PO TABS: 1.0000 | ORAL_TABLET | Freq: Four times a day (QID) | ORAL | 0 refills | Status: DC | PRN

## 2019-08-23 MED ORDER — ONDANSETRON HCL 4 MG/2ML IJ SOLN
INTRAMUSCULAR | Status: DC | PRN
  Administered 2019-08-23: 4 mg via INTRAVENOUS

## 2019-08-23 MED ORDER — 0.9 % SODIUM CHLORIDE (POUR BTL) OPTIME
TOPICAL | Status: DC | PRN
  Administered 2019-08-23: 1000 mL

## 2019-08-23 MED ORDER — LIDOCAINE-EPINEPHRINE 0.5 %-1:200000 IJ SOLN
INTRAMUSCULAR | Status: DC | PRN
  Administered 2019-08-23: 30 mL

## 2019-08-23 MED ORDER — LIDOCAINE-EPINEPHRINE 0.5 %-1:200000 IJ SOLN
INTRAMUSCULAR | Status: AC
  Filled 2019-08-23: qty 1

## 2019-08-23 MED ORDER — FENTANYL CITRATE (PF) 250 MCG/5ML IJ SOLN
INTRAMUSCULAR | Status: AC
  Filled 2019-08-23: qty 5

## 2019-08-23 MED ORDER — EPHEDRINE SULFATE-NACL 50-0.9 MG/10ML-% IV SOSY
PREFILLED_SYRINGE | INTRAVENOUS | Status: DC | PRN
  Administered 2019-08-23 (×2): 10 mg via INTRAVENOUS

## 2019-08-23 MED ORDER — ONDANSETRON HCL 4 MG/2ML IJ SOLN
INTRAMUSCULAR | Status: AC
  Filled 2019-08-23: qty 2

## 2019-08-23 MED ORDER — FENTANYL CITRATE (PF) 250 MCG/5ML IJ SOLN
INTRAMUSCULAR | Status: DC | PRN
  Administered 2019-08-23: 50 ug via INTRAVENOUS
  Administered 2019-08-23: 25 ug via INTRAVENOUS

## 2019-08-23 MED ORDER — SODIUM CHLORIDE 0.9 % IV SOLN
INTRAVENOUS | Status: DC | PRN
  Administered 2019-08-23: 500 mL

## 2019-08-23 MED ORDER — PHENYLEPHRINE 40 MCG/ML (10ML) SYRINGE FOR IV PUSH (FOR BLOOD PRESSURE SUPPORT)
PREFILLED_SYRINGE | INTRAVENOUS | Status: DC | PRN
  Administered 2019-08-23: 80 ug via INTRAVENOUS
  Administered 2019-08-23: 120 ug via INTRAVENOUS

## 2019-08-23 MED ORDER — PROPOFOL 10 MG/ML IV BOLUS
INTRAVENOUS | Status: AC
  Filled 2019-08-23: qty 20

## 2019-08-23 MED ORDER — PHENYLEPHRINE HCL-NACL 10-0.9 MG/250ML-% IV SOLN
INTRAVENOUS | Status: DC | PRN
  Administered 2019-08-23: 20 ug/min via INTRAVENOUS

## 2019-08-23 MED ORDER — PROPOFOL 500 MG/50ML IV EMUL
INTRAVENOUS | Status: DC | PRN
  Administered 2019-08-23: 75 ug/kg/min via INTRAVENOUS

## 2019-08-23 MED ORDER — HYDROCODONE-ACETAMINOPHEN 5-325 MG PO TABS: 1.0000 | ORAL_TABLET | ORAL | Status: DC | PRN

## 2019-08-23 MED ORDER — SODIUM CHLORIDE 0.9 % IV SOLN: INTRAVENOUS | Status: DC

## 2019-08-23 MED ORDER — LIDOCAINE HCL (PF) 0.5 % IJ SOLN
INTRAMUSCULAR | Status: AC
  Filled 2019-08-23: qty 50

## 2019-08-23 MED ORDER — DEXAMETHASONE SODIUM PHOSPHATE 10 MG/ML IJ SOLN
INTRAMUSCULAR | Status: DC | PRN
  Administered 2019-08-23: 10 mg via INTRAVENOUS

## 2019-08-23 SURGICAL SUPPLY — 34 items
ARMBAND PINK RESTRICT EXTREMIT (MISCELLANEOUS) ×3 IMPLANT
BAG DECANTER FOR FLEXI CONT (MISCELLANEOUS) ×1 IMPLANT
CANISTER SUCT 3000ML PPV (MISCELLANEOUS) ×2 IMPLANT
CANNULA VESSEL 3MM 2 BLNT TIP (CANNULA) ×2 IMPLANT
CLIP LIGATING EXTRA MED SLVR (CLIP) ×2 IMPLANT
CLIP LIGATING EXTRA SM BLUE (MISCELLANEOUS) ×2 IMPLANT
COVER PROBE W GEL 5X96 (DRAPES) ×2 IMPLANT
DECANTER SPIKE VIAL GLASS SM (MISCELLANEOUS) ×2 IMPLANT
DERMABOND ADHESIVE PROPEN (GAUZE/BANDAGES/DRESSINGS) ×1
DERMABOND ADVANCED (GAUZE/BANDAGES/DRESSINGS) ×1
DERMABOND ADVANCED .7 DNX12 (GAUZE/BANDAGES/DRESSINGS) ×1 IMPLANT
DERMABOND ADVANCED .7 DNX6 (GAUZE/BANDAGES/DRESSINGS) IMPLANT
DRSG TEGADERM 2-3/8X2-3/4 SM (GAUZE/BANDAGES/DRESSINGS) ×1 IMPLANT
ELECT REM PT RETURN 9FT ADLT (ELECTROSURGICAL) ×2
ELECTRODE REM PT RTRN 9FT ADLT (ELECTROSURGICAL) ×1 IMPLANT
GLOVE BIO SURGEON STRL SZ 6 (GLOVE) ×2 IMPLANT
GLOVE BIO SURGEON STRL SZ7.5 (GLOVE) ×1 IMPLANT
GLOVE BIOGEL PI IND STRL 6.5 (GLOVE) IMPLANT
GLOVE BIOGEL PI INDICATOR 6.5 (GLOVE) ×1
GLOVE SS BIOGEL STRL SZ 7.5 (GLOVE) ×1 IMPLANT
GLOVE SUPERSENSE BIOGEL SZ 7.5 (GLOVE) ×1
GOWN STRL REUS W/ TWL LRG LVL3 (GOWN DISPOSABLE) ×3 IMPLANT
GOWN STRL REUS W/TWL LRG LVL3 (GOWN DISPOSABLE) ×4
GRAFT GORETEX STRT 4-7X45 (Vascular Products) ×1 IMPLANT
KIT BASIN OR (CUSTOM PROCEDURE TRAY) ×2 IMPLANT
KIT TURNOVER KIT B (KITS) ×2 IMPLANT
NS IRRIG 1000ML POUR BTL (IV SOLUTION) ×2 IMPLANT
PACK CV ACCESS (CUSTOM PROCEDURE TRAY) ×2 IMPLANT
SUT PROLENE 6 0 CC (SUTURE) ×3 IMPLANT
SUT VIC AB 3-0 SH 27 (SUTURE) ×1
SUT VIC AB 3-0 SH 27X BRD (SUTURE) ×1 IMPLANT
TOWEL GREEN STERILE (TOWEL DISPOSABLE) ×2 IMPLANT
UNDERPAD 30X30 (UNDERPADS AND DIAPERS) ×2 IMPLANT
WATER STERILE IRR 1000ML POUR (IV SOLUTION) ×2 IMPLANT

## 2019-08-23 NOTE — Op Note (Signed)
    OPERATIVE REPORT  DATE OF SURGERY: 08/23/2019  PATIENT: Tyler Hahn, 79 y.o. male MRN: [id number]  DOB: Nov 01, 1940  PRE-OPERATIVE DIAGNOSIS: Chronic renal insufficiency  POST-OPERATIVE DIAGNOSIS:  Same  PROCEDURE: Right upper arm AV Gore-Tex graft placement  SURGEON:  Curt Jews, M.D.  PHYSICIAN ASSISTANT: Matt Eveland PA-C  ANESTHESIA: Local with sedation  EBL: per anesthesia record  Total I/O In: 500 [I.V.:400; IV Piggyback:100] Out: 360 [Urine:350; Blood:10]  BLOOD ADMINISTERED: none  DRAINS: none  SPECIMEN: none  COUNTS CORRECT:  YES  PATIENT DISPOSITION:  PACU - hemodynamically stable  PROCEDURE DETAILS: Patient was taken operating placed to position with area of the right arm is prepped draped in usual sterile fashion.  SonoSite ultrasound was used to visualize the veins.  The cephalic and basilic veins were thrombosed.  The patient had patency of the axillary vein.  They had a normal radial pulse and brachial pulse.  Local anesthesia was used and incision was made over the brachial artery at the antecubital space and carried down to isolate the artery which was of good caliber.  The separate incision was made using local anesthesia over the axillary pulse in the axillary vein was isolated and was of good caliber.  A tunnel was created between the level of the antecubital space and the axilla and a 4 x 7 tapered stretch graft was brought through the tunnel.  The brachial artery was occluded proximally distally was opened with an 11 blade sent longstanding with Potts scissors.  The small tapered into the graft was cut at approximately 5 mm level and was spatulated and sewn end-to-side to the artery with a running 6-0 Prolene suture.  This anastomosis was tested and found to be adequate.  The graft was flushed with heparinized saline and reoccluded.  The axillary vein was occluded proximally distally and was opened 11 blade saline proceeding with Potts scissors.  The  graft was cut to the appropriate length and was slightly spatulated and was sewn end-to-side to the vein with a running 6-0 Prolene suture.  Clamps removed and excellent thrill was noted.  The wounds irrigated with saline.  Hemostasis after cautery.  Wounds were closed with 3-0 Vicryl in the subcutaneous and subcuticular tissue.  Sterile dressing was applied and the patient was transferred to the recovery in stable condition   Rosetta Posner, M.D., [facility] 08/23/2019 12:18 PM

## 2019-08-23 NOTE — Progress Notes (Signed)
Patient alert and oriented to current events, year, President of [place], and medical history on arrival to Short Stay.

## 2019-08-23 NOTE — Transfer of Care (Signed)
Immediate Anesthesia Transfer of Care Note  Patient: Tyler Hahn  Procedure(s) Performed: ARTERIOVENOUS GORTEX GRAFT RIGHT ARM (Right Arm Upper)  Patient Location: PACU  Anesthesia Type:MAC  Level of Consciousness: drowsy and patient cooperative  Airway & Oxygen Therapy: Patient Spontanous Breathing  Post-op Assessment: Report given to RN, Post -op Vital signs reviewed and stable and Patient moving all extremities X 4  Post vital signs: Reviewed and stable  Last Vitals:  Vitals Value Taken Time  BP 142/63 08/23/19 1054  Temp    Pulse 67 08/23/19 1056  Resp 14 08/23/19 1056  SpO2 96 % 08/23/19 1056  Vitals shown include unvalidated device data.  Last Pain:  Vitals:   08/23/19 1050  TempSrc:   PainSc: (P) 0-No pain      Patients Stated Pain Goal: 0 (94/32/76 1470)  Complications: No apparent anesthesia complications

## 2019-08-23 NOTE — Anesthesia Preprocedure Evaluation (Signed)
Anesthesia Evaluation  Patient identified by MRN, date of birth, ID band Patient awake    Reviewed: Allergy & Precautions, NPO status , Patient's Chart, lab work & pertinent test results  Airway Mallampati: II  TM Distance: >3 FB Neck ROM: Full    Dental  (+) Teeth Intact, Dental Advisory Given   Pulmonary former smoker,    breath sounds clear to auscultation       Cardiovascular hypertension,  Rhythm:Regular     Neuro/Psych    GI/Hepatic   Endo/Other  diabetes  Renal/GU      Musculoskeletal   Abdominal   Peds  Hematology   Anesthesia Other Findings   Reproductive/Obstetrics                             Anesthesia Physical Anesthesia Plan  ASA: III  Anesthesia Plan: MAC   Post-op Pain Management:    Induction: Intravenous  PONV Risk Score and Plan: Ondansetron and Propofol infusion  Airway Management Planned: Natural Airway and Simple Face Mask  Additional Equipment:   Intra-op Plan:   Post-operative Plan:   Informed Consent: I have reviewed the patients History and Physical, chart, labs and discussed the procedure including the risks, benefits and alternatives for the proposed anesthesia with the patient or authorized representative who has indicated his/her understanding and acceptance.       Plan Discussed with: CRNA and Anesthesiologist  Anesthesia Plan Comments:         Anesthesia Quick Evaluation

## 2019-08-23 NOTE — Progress Notes (Signed)
PT Cancellation Note  Patient Details Name: Tyler Hahn MRN: [id number] DOB: 1940/09/26   Cancelled Treatment:    Reason Eval/Treat Not Completed: Other (comment).  Has declined therapy but just returned awhile ago from his RUE procedure, per nsg no wb limitations.  Will try again as time and pt allow.   [name] 08/23/2019, 3:51 PM   Mee Hives, PT MS Acute Rehab Dept. Number: [place] [facility] [place]

## 2019-08-23 NOTE — Anesthesia Postprocedure Evaluation (Signed)
Anesthesia Post Note  Patient: Tyler Hahn  Procedure(s) Performed: ARTERIOVENOUS GORTEX GRAFT RIGHT ARM (Right Arm Upper)     Patient location during evaluation: PACU Anesthesia Type: MAC Level of consciousness: awake and alert Pain management: pain level controlled Vital Signs Assessment: post-procedure vital signs reviewed and stable Respiratory status: spontaneous breathing, nonlabored ventilation, respiratory function stable and patient connected to nasal cannula oxygen Cardiovascular status: stable and blood pressure returned to baseline Postop Assessment: no apparent nausea or vomiting Anesthetic complications: no    Last Vitals:  Vitals:   08/23/19 1105 08/23/19 1123  BP: 130/60 (!) 136/58  Pulse: 69 66  Resp: 13 14  Temp: 36.8 C (!) 36.4 C  SpO2: 95% 97%    Last Pain:  Vitals:   08/23/19 1127  TempSrc:   PainSc: 0-No pain                 Alexzandra Bilton COKER

## 2019-08-23 NOTE — Interval H&P Note (Signed)
History and Physical Interval Note:  08/23/2019 10:46 AM  Tyler Hahn  has presented today for surgery, with the diagnosis of End stage renal disease.  The various methods of treatment have been discussed with the patient and family. After consideration of risks, benefits and other options for treatment, the patient has consented to  Procedure(s): ARTERIOVENOUS GORTEX GRAFT RIGHT ARM (Right) as a surgical intervention.  The patient's history has been reviewed, patient examined, no change in status, stable for surgery.  I have reviewed the patient's chart and labs.  Questions were answered to the patient's satisfaction.     Curt Jews

## 2019-08-23 NOTE — Progress Notes (Signed)
PROGRESS NOTE    Tyler Hahn  WIO:[id number] DOB: [birth year] DOA: 08/17/2019 PCP: [name], MD     Brief Narrative:  79 year old man admitted from home on 3/2 after being directed here by his PCP due to abnormal labs.  His past medical history significant for stage IV chronic kidney disease with a baseline creatinine between 3.4 and 3.7, chronic systolic heart failure with an ejection fraction of 20 to 25% status post AICD placement, hypothyroidism, anemia of chronic disease with a baseline hemoglobin between 9 and 11.  He has been complaining of generalized weakness for the past 2 to 3 days, his wife recently passed away 2 weeks ago.  He has been having some depressed mood which has led to decreased oral intake.  He had labs drawn by his PCP and was asked to come to the hospital due to worsening renal function and hyperkalemia.   Assessment & Plan:   Principal Problem:   Acute renal failure (ARF) (HCC) Active Problems:   SYSTOLIC HEART FAILURE, CHRONIC   Hypothyroid   Hyperkalemia   History of anemia due to CKD   Acute on chronic kidney disease stage IV -Baseline creatinine between 3.5 and 3.7. -Creatinine on admission was 6.54, slight decreased to 6.49 3/3, 6.49 3/4. Up slightly to 6.57 on 3/5. Further increased to 6.92 on 3/6. 6.15 on 3/7. 6.22 on 3/8. -Nephrology is on board. -Foley placed 3/5. -Continue holding nephrotoxic agents including lisinopril and torsemide as well as any NSAIDs. -Vein mapping complete. -Permanent access planned for later this morning. -Await nephrology recs to see whether we are starting HD inpatient or outpatient (suspect it will be OP).  Hyperkalemia -With no EKG changes on admission. -He was treated with insulin/D5, Lokelma, bicarbonate. -Potassium is 4.9 today.  Anemia of chronic disease -Due to advanced renal dysfunction. -Hemoglobin ws 8.8 on 3/6. -Continue to follow and transfuse if <7.0.  Generalized weakness -Likely  due to acute on chronic kidney disease. -Recent TSH was normal at 2.91. -There is no evidence of an acute infectious process at this time. -He remains on Zoloft for his depression. -PT pending; OT: HH with supervision  Hypothyroidism -TSH is normal, continue home dose of Synthroid.  Chronic systolic heart failure -With the most recent echo from April 2014 at which time ejection fraction was 20 to 25% -This appears stable and compensated at this time. -Outpatient medications include Coreg, lisinopril and torsemide.    DVT prophylaxis: SCDs Code Status: Full code Family Communication: patient only Disposition Plan:  HD access placement planned for 3/8. If no plans to start inpatient HD, plan for DC home on 3/9.  Consultants:   Nephrology  Procedures:   None  Antimicrobials:  Anti-infectives (From admission, onward)   Start     Dose/Rate Route Frequency Ordered Stop   08/23/19 0600  ceFAZolin (ANCEF) IVPB 2g/100 mL premix     2 g 200 mL/hr over 30 Minutes Intravenous To Short Stay 08/22/19 0916 08/23/19 0950       Subjective: Lying in bed, awake watching TV no new complaints. States he feels well just a little weak.  Objective: Vitals:   08/23/19 0526 08/23/19 1050 08/23/19 1105 08/23/19 1123  BP: 130/63 (!) 142/63 130/60 (!) 136/58  Pulse: 64 73 69 66  Resp: 16 16 13 14   Temp: 98.5 F (36.9 C) (!) 97.5 F (36.4 C) 98.2 F (36.8 C) (!) 97.5 F (36.4 C)  TempSrc: Oral   Axillary  SpO2: 96% 96% 95% 97%  Weight:      Height:        Intake/Output Summary (Last 24 hours) at 08/23/2019 1336 Last data filed at 08/23/2019 1200 Gross per 24 hour  Intake 820 ml  Output 2585 ml  Net -1765 ml   Filed Weights   08/21/19 0500 08/22/19 0500 08/22/19 2057  Weight: 76.6 kg 76.6 kg 74.1 kg    Examination:  General exam: Alert, awake, oriented x 3 Respiratory system: Clear to auscultation. Respiratory effort normal. Cardiovascular system:RRR. No murmurs, rubs,  gallops. Gastrointestinal system: Abdomen is nondistended, soft and nontender. No organomegaly or masses felt. Normal bowel sounds heard. Central nervous system: Alert and oriented. No focal neurological deficits. Extremities: 1+ pitting edema bilaterally., +pedal pulses Skin: No rashes, lesions or ulcers Psychiatry: Judgement and insight appear normal. Mood & affect appropriate.     Data Reviewed: I have personally reviewed following labs and imaging studies  CBC: Recent Labs  Lab 08/16/19 1424 08/16/19 1424 08/17/19 1633 08/18/19 0725 08/19/19 0534 08/20/19 0656 08/21/19 0346  WBC 5.0   < > 5.6 5.3 5.1 3.1* 3.8*  NEUTROABS 3.1  --  3.5  --   --   --   --   HGB 9.5*   < > 9.4* 9.9* 10.0* 8.7* 8.8*  HCT 29.3*   < > 29.9* 30.7* 31.1* 27.3* 26.6*  MCV 86.4   < > 86.9 85.5 86.4 84.8 82.6  PLT 128.0*   < > 112* 108* 108* 86* 92*   < > = values in this interval not displayed.   Basic Metabolic Panel: Recent Labs  Lab 08/17/19 2030 08/17/19 2030 08/18/19 0725 08/18/19 0725 08/19/19 0534 08/20/19 3614 08/21/19 0346 08/22/19 0426 08/23/19 0439  NA 138   < > 137   < > 138 137 140 140 141  K 5.2*   < > 4.8   < > 4.9 4.7 4.8 4.5 4.9  CL 107   < > 105   < > 102 105 108 109 108  CO2 19*   < > 18*   < > 18* 18* 18* 18* 18*  GLUCOSE 50*   < > 97   < > 155* 100* 96 85 105*  BUN 137*   < > 131*   < > 134* 135* 140* 135* 135*  CREATININE 6.54*   < > 6.49*   < > 6.49* 6.57* 6.92* 6.15* 6.22*  CALCIUM 10.0   < > 9.8   < > 9.9 9.2 9.2 9.1 9.3  MG 1.6*  --  1.4*  --   --   --   --   --   --   PHOS  --   --  6.7*  --   --   --   --   --   --    < > = values in this interval not displayed.   GFR: Estimated Creatinine Clearance: 9.6 mL/min (A) (by C-G formula based on SCr of 6.22 mg/dL (H)). Liver Function Tests: Recent Labs  Lab 08/16/19 1424 08/17/19 1633 08/18/19 0725  AST 11 18  --   ALT 11 15  --   ALKPHOS 51 49  --   BILITOT 0.3 0.6  --   PROT 7.6 8.1  --   ALBUMIN 4.3  4.2 3.8   No results for input(s): LIPASE, AMYLASE in the last 168 hours. No results for input(s): AMMONIA in the last 168 hours. Coagulation Profile: No results for input(s): INR, PROTIME in the last 168  hours. Cardiac Enzymes: No results for input(s): CKTOTAL, CKMB, CKMBINDEX, TROPONINI in the last 168 hours. BNP (last 3 results) No results for input(s): PROBNP in the last 8760 hours. HbA1C: No results for input(s): HGBA1C in the last 72 hours. CBG: Recent Labs  Lab 08/18/19 0858 08/23/19 0831 08/23/19 1055  GLUCAP 80 84 97   Lipid Profile: No results for input(s): CHOL, HDL, LDLCALC, TRIG, CHOLHDL, LDLDIRECT in the last 72 hours. Thyroid Function Tests: No results for input(s): TSH, T4TOTAL, FREET4, T3FREE, THYROIDAB in the last 72 hours. Anemia Panel: No results for input(s): VITAMINB12, FOLATE, FERRITIN, TIBC, IRON, RETICCTPCT in the last 72 hours. Urine analysis:    Component Value Date/Time   COLORURINE STRAW (A) 08/17/2019 1847   APPEARANCEUR CLEAR 08/17/2019 1847   LABSPEC 1.006 08/17/2019 1847   PHURINE 5.0 08/17/2019 1847   GLUCOSEU NEGATIVE 08/17/2019 1847   HGBUR NEGATIVE 08/17/2019 1847   BILIRUBINUR NEGATIVE 08/17/2019 1847   BILIRUBINUR negative 07/28/2014 1419   KETONESUR NEGATIVE 08/17/2019 1847   PROTEINUR NEGATIVE 08/17/2019 1847   UROBILINOGEN 0.2 07/28/2014 1419   NITRITE NEGATIVE 08/17/2019 1847   LEUKOCYTESUR NEGATIVE 08/17/2019 1847   Sepsis Labs: @LABRCNTIP (procalcitonin:4,lacticidven:4)  ) Recent Results (from the past 240 hour(s))  SARS CORONAVIRUS 2 (TAT 6-24 HRS) Nasopharyngeal Nasopharyngeal Swab     Status: None   Collection Time: 08/17/19  7:59 PM   Specimen: Nasopharyngeal Swab  Result Value Ref Range Status   SARS Coronavirus 2 NEGATIVE NEGATIVE Final    Comment: (NOTE) SARS-CoV-2 target nucleic acids are NOT DETECTED. The SARS-CoV-2 RNA is generally detectable in upper and lower respiratory specimens during the acute phase of  infection. Negative results do not preclude SARS-CoV-2 infection, do not rule out co-infections with other pathogens, and should not be used as the sole basis for treatment or other patient management decisions. Negative results must be combined with clinical observations, patient history, and epidemiological information. The expected result is Negative. Fact Sheet for Patients: SugarRoll.be Fact Sheet for Healthcare Providers: https://www.woods-mathews.com/ This test is not yet approved or cleared by the [place] FDA and  has been authorized for detection and/or diagnosis of SARS-CoV-2 by FDA under an Emergency Use Authorization (EUA). This EUA will remain  in effect (meaning this test can be used) for the duration of the COVID-19 declaration under Section 56 4(b)(1) of the Act, 21 U.S.C. section 360bbb-3(b)(1), unless the authorization is terminated or revoked sooner. Performed at [facility], [place] [address] [place], [place] [address]   MRSA PCR Screening     Status: None   Collection Time: 08/18/19  1:27 AM   Specimen: Nasal Mucosa; Nasopharyngeal  Result Value Ref Range Status   MRSA by PCR NEGATIVE NEGATIVE Final    Comment:        The GeneXpert MRSA Assay (FDA approved for NASAL specimens only), is one component of a comprehensive MRSA colonization surveillance program. It is not intended to diagnose MRSA infection nor to guide or monitor treatment for MRSA infections. Performed at [facility], [place] [address] [place],  [address]   Surgical pcr screen     Status: None   Collection Time: 08/22/19  9:54 PM   Specimen: Nasal Mucosa; Nasal Swab  Result Value Ref Range Status   MRSA, PCR NEGATIVE NEGATIVE Final   Staphylococcus aureus NEGATIVE NEGATIVE Final    Comment: (NOTE) The Xpert SA Assay (FDA approved for NASAL specimens in patients 82 years of age and older), is one component  of a  comprehensive surveillance program. It is not intended to diagnose infection nor to guide or monitor treatment. Performed at [facility], [place] [address], [place], [place] [address]          Radiology Studies: No results found.      Scheduled Meds: . albuterol  10 mg Nebulization Once  . atorvastatin  20 mg Oral Daily  . carvedilol  12.5 mg Oral BID  . Chlorhexidine Gluconate Cloth  6 each Topical Daily  . levothyroxine  25 mcg Oral QAC breakfast  . sodium bicarbonate  1,300 mg Oral BID  . sodium chloride flush  3 mL Intravenous Q12H  . torsemide  20 mg Oral Daily   Continuous Infusions: . sodium chloride 10 mL/hr at 08/23/19 0908     LOS: 6 days    Time spent: 25 minutes.. Greater than 50% of this time was spent in direct contact with the patient, coordinating care and discussing relevant ongoing clinical issues.     Lelon Frohlich, MD [facility] Pager [phone]  If 7PM-7AM, please contact night-coverage [email or web address] Password [facility] 08/23/2019, 1:36 PM

## 2019-08-23 NOTE — Discharge Instructions (Signed)
° °  [facility] ° °Discharge Instructions ° °AV Fistula or Graft Surgery for Dialysis Access ° °Please refer to the following instructions for your post-procedure care. Your surgeon or physician assistant will discuss any changes with you. ° °Activity ° °You may drive the day following your surgery, if you are comfortable and no longer taking prescription pain medication. Resume full activity as the soreness in your incision resolves. ° °Bathing/Showering ° °You may shower after you go home. Keep your incision dry for 48 hours. Do not soak in a bathtub, hot tub, or swim until the incision heals completely. You may not shower if you have a hemodialysis catheter. ° °Incision Care ° °Clean your incision with mild soap and water after 48 hours. Pat the area dry with a clean towel. You do not need a bandage unless otherwise instructed. Do not apply any ointments or creams to your incision. You may have skin glue on your incision. Do not peel it off. It will come off on its own in about one week. Your arm may swell a bit after surgery. To reduce swelling use pillows to elevate your arm so it is above your heart. Your doctor will tell you if you need to lightly wrap your arm with an ACE bandage. ° °Diet ° °Resume your normal diet. There are not special food restrictions following this procedure. In order to heal from your surgery, it is CRITICAL to get adequate nutrition. Your body requires vitamins, minerals, and protein. Vegetables are the best source of vitamins and minerals. Vegetables also provide the perfect balance of protein. Processed food has little nutritional value, so try to avoid this. ° °Medications ° °Resume taking all of your medications. If your incision is causing pain, you may take over-the counter pain relievers such as acetaminophen (Tylenol). If you were prescribed a stronger pain medication, please be aware these medications can cause nausea and constipation. Prevent  nausea by taking the medication with a snack or meal. Avoid constipation by drinking plenty of fluids and eating foods with high amount of fiber, such as fruits, vegetables, and grains. Do not take Tylenol if you are taking prescription pain medications. ° ° ° ° °Follow up °Your surgeon may want to see you in the office following your access surgery. If so, this will be arranged at the time of your surgery. ° °Please call us immediately for any of the following conditions: ° °Increased pain, redness, drainage (pus) from your incision site °Fever of 101 degrees or higher °Severe or worsening pain at your incision site °Hand pain or numbness. ° °Reduce your risk of vascular disease: ° °Stop smoking. If you would like help, call [facility] at 1-800-QUIT-NOW (1-800-784-8669) or [place] at [phone] ° °Manage your cholesterol °Maintain a desired weight °Control your diabetes °Keep your blood pressure down ° °Dialysis ° °It will take several weeks to several months for your new dialysis access to be ready for use. Your surgeon will determine when it is OK to use it. Your nephrologist will continue to direct your dialysis. You can continue to use your Permcath until your new access is ready for use. ° °If you have any questions, please call the office at [phone]. ° °

## 2019-08-23 NOTE — Plan of Care (Signed)
  Problem: Health Behavior/Discharge Planning: Goal: Ability to manage health-related needs will improve Outcome: Progressing   

## 2019-08-23 NOTE — Progress Notes (Signed)
Tyler Hahn Progress Note   78 y.o.male HTN, DM, HLD, NICM s/p ICD placement, and CKD stage IV (followed by Dr. Joelyn Oms in our office) noted to have AKI and hyperkalemia. He was instructed to to the ED to be further evaluated. labs were rechecked with worsening renal function, azotemia, and hyperkalemia. He did lose his wife of over 50 years several weeks ago (she was on dialysis for a short time but did not do well). He denies any NSAIDs or COX-II I's but has been on lisinopril and torsemide prior to admission.  Assessment/ Plan:   1. AKI/CKD stage IV- suspect bladder outlet obstruction given palpable suprapubic mass and bladder scan with 675 ml. Renal US- technically difficult, small kidneys but no hydro. 1. Continue holdinglisinopril; restart  Torsemide;   Continue NaHCO3 2 tabs bid. 2. No uremic symptoms so no urgent need for dialysis; not much improvement in renal  function despite  insertion but UOP much better. Hopefully this is a post  Obstructive diuresis. 3. Question is whether damage has already been done.  D/w VVS Dr. Carlis Abbott s/p AVG on 3/8- looks good.   if numbers remain stable with good UOP and not obviously uremic to f/u closely with [facility] in 2 weeks with initiation of dialysis non emergently through the perm access in a few months.  The only caveat is if he does not feel strong enough to go home, that might be our indication to start  2. Hyperkalemia- no ECG changes. Pt treated in the ED with bicarb, insulin/D5, lokelma but  helped  with relief of bladder outlet obstruction and foley catheter placement. 3. Anemia of CKD stage IV- TSAT 31! With Ferritin of 244 -> given Aranesp 60 mcg sq 3/5 4. HTN- stable and follow off of lisinopril. May need to start hydralazine or amlodipine if BP rises. 5. DM- per primary 6. NICMP s/p AICD- stable 7. CKD stage IV- felt to be due to DM and followed closely by Dr. Joelyn Oms. He is leaning towards PD if he needs to start  dialysis but has agreed to backup avf placement. 8. Thrombocytopenia- continue to follow- trended up today   Subjective:   - 3800 of UOP - said didn't feel great this AM but better   Objective:   BP (!) 136/58 (BP Location: Left Arm)   Pulse 66   Temp (!) 97.5 F (36.4 C) (Axillary)   Resp 14   Ht 5\' 8"  (1.727 m)   Wt 74.1 kg   SpO2 97%   BMI 24.84 kg/m   Intake/Output Summary (Last 24 hours) at 08/23/2019 1252 Last data filed at 08/23/2019 1200 Gross per 24 hour  Intake 820 ml  Output 2585 ml  Net -1765 ml   Weight change: -2.5 kg  Physical Exam: General appearance:NAD, NCAT Resp:clear to auscultation bilaterallywith no rales noted Cardio:regular rate and rhythm, no rub andIII/VI SEM around precordium UD:JSHF, nontender, +BS Extremities:extremities normal, atraumatic, no cyanosis or edema- new right AVG-  Good thrill and bruit-  Not much swelling  Neuro: no asterixis GU: foley to gravity  Imaging: No results found.  Labs: BMET Recent Labs  Lab 08/17/19 2030 08/18/19 0725 08/19/19 0534 08/20/19 0656 08/21/19 0346 08/22/19 0426 08/23/19 0439  NA 138 137 138 137 140 140 141  K 5.2* 4.8 4.9 4.7 4.8 4.5 4.9  CL 107 105 102 105 108 109 108  CO2 19* 18* 18* 18* 18* 18* 18*  GLUCOSE 50* 97 155* 100* 96 85 105*  BUN  137* 131* 134* 135* 140* 135* 135*  CREATININE 6.54* 6.49* 6.49* 6.57* 6.92* 6.15* 6.22*  CALCIUM 10.0 9.8 9.9 9.2 9.2 9.1 9.3  PHOS  --  6.7*  --   --   --   --   --    CBC Recent Labs  Lab 08/16/19 1424 08/16/19 1424 08/17/19 1633 08/17/19 1633 08/18/19 0725 08/19/19 0534 08/20/19 0656 08/21/19 0346  WBC 5.0   < > 5.6   < > 5.3 5.1 3.1* 3.8*  NEUTROABS 3.1  --  3.5  --   --   --   --   --   HGB 9.5*   < > 9.4*   < > 9.9* 10.0* 8.7* 8.8*  HCT 29.3*   < > 29.9*   < > 30.7* 31.1* 27.3* 26.6*  MCV 86.4   < > 86.9   < > 85.5 86.4 84.8 82.6  PLT 128.0*   < > 112*   < > 108* 108* 86* 92*   < > = values in this interval not displayed.     Medications:    . albuterol  10 mg Nebulization Once  . atorvastatin  20 mg Oral Daily  . carvedilol  12.5 mg Oral BID  . Chlorhexidine Gluconate Cloth  6 each Topical Daily  . levothyroxine  25 mcg Oral QAC breakfast  . sodium bicarbonate  1,300 mg Oral BID  . sodium chloride flush  3 mL Intravenous Q12H  . torsemide  20 mg Oral Daily      Tyler Hahn  08/23/2019, 12:52 PM

## 2019-08-24 LAB — CBC
HCT: 26.1 % — ABNORMAL LOW (ref 39.0–52.0)
Hemoglobin: 8.6 g/dL — ABNORMAL LOW (ref 13.0–17.0)
MCH: 27.3 pg (ref 26.0–34.0)
MCHC: 33 g/dL (ref 30.0–36.0)
MCV: 82.9 fL (ref 80.0–100.0)
Platelets: 128 10*3/uL — ABNORMAL LOW (ref 150–400)
RBC: 3.15 MIL/uL — ABNORMAL LOW (ref 4.22–5.81)
RDW: 14.5 % (ref 11.5–15.5)
WBC: 6 10*3/uL (ref 4.0–10.5)
nRBC: 0 % (ref 0.0–0.2)

## 2019-08-24 LAB — RENAL FUNCTION PANEL
Albumin: 3.2 g/dL — ABNORMAL LOW (ref 3.5–5.0)
Anion gap: 15 (ref 5–15)
BUN: 137 mg/dL — ABNORMAL HIGH (ref 8–23)
CO2: 18 mmol/L — ABNORMAL LOW (ref 22–32)
Calcium: 9 mg/dL (ref 8.9–10.3)
Chloride: 107 mmol/L (ref 98–111)
Creatinine, Ser: 6.26 mg/dL — ABNORMAL HIGH (ref 0.61–1.24)
GFR calc Af Amer: 9 mL/min — ABNORMAL LOW (ref >60)
GFR calc non Af Amer: 8 mL/min — ABNORMAL LOW (ref >60)
Glucose, Bld: 144 mg/dL — ABNORMAL HIGH (ref 70–99)
Phosphorus: 6 mg/dL — ABNORMAL HIGH (ref 2.5–4.6)
Potassium: 5 mmol/L (ref 3.5–5.1)
Sodium: 140 mmol/L (ref 135–145)

## 2019-08-24 MED ORDER — SODIUM BICARBONATE 650 MG PO TABS: 1300.0000 mg | ORAL_TABLET | Freq: Two times a day (BID) | ORAL | 0 refills | Status: DC

## 2019-08-24 NOTE — Progress Notes (Signed)
Patient ID: Tyler Hahn, male   DOB: 15-Feb-1941, 79 y.o.   MRN: [id number] Comfortable this morning.  No steal symptoms in his right hand. Axillary and antecubital incisions healing.  Excellent thrill in his graft. Will not follow actively.  Should be able to use his graft in 4 weeks

## 2019-08-24 NOTE — Discharge Summary (Signed)
Physician Discharge Summary  Tyler Hahn ASN:[id number] DOB: 22-Dec-1940 DOA: 08/17/2019  PCP: [name], MD  Admit date: 08/17/2019 Discharge date: 08/24/2019  Time spent: 45 minutes  Recommendations for Outpatient Follow-up:  To be discharged home today. -Home health services to be arranged. -Follow-up with Dr. Donnetta Hutching as scheduled to see how his fistula is maturing. -Nephrology will arrange for follow-up in their office in regards to his nearing dialysis needs. -He will be discharged with Foley catheter in place given his urinary retention issues.  Discharge Diagnoses:  Principal Problem:   Acute renal failure (ARF) ([place]) Active Problems:   SYSTOLIC HEART FAILURE, CHRONIC   Hypothyroid   Hyperkalemia   History of anemia due to CKD   Discharge Condition: Stable and improved  Filed Weights   08/21/19 0500 08/22/19 0500 08/22/19 2057  Weight: 76.6 kg 76.6 kg 74.1 kg    History of present illness:  As per. Howertor on 3/2: Tyler Hahn is a 79 y.o. male with medical history significant for stage IV chronic kidney disease with baseline creatinine of 1.9-3.7, chronic systolic heart failure with echocardiogram in April 2014 showing LVEF 20 to 25% status post AICD placement in 2000, acquired hypothyroidism, chronic anemia of CKD with baseline hemoglobin of 9-11, who is admitted to [facility] on 08/17/2019 with acute renal failure superimposed on stage IV chronic kidney disease after presenting from home to [facility] complaining of generalized weakness.   The patient reports generalized weakness over the last 2 to 3 days in the absence of any acute focal weakness.  He reports that his wife just passed away 2 weeks ago, and that he has been experiencing ensuing depressed mood, which he states has contributed to an interval decline in his oral intake of food and fluid over that time due to associated anorexia.  He denies any  associated nausea, vomiting, diarrhea, or abdominal discomfort. Denies any recent subjective fever, chills, rigors, or generalized myalgias. Denies any recent headache, neck stiffness, rhinitis, rhinorrhea, sore throat, cough, or rash. No recent traveling or known COVID-19 exposures. Denies dysuria, gross hematuria, or change in urinary urgency/frequency.   The patient denies any recent shortness of breath, orthopnea, PND, or development of peripheral edema.  He denies any recent changes to his outpatient medication regimen.  Within his femur, he also reports that he has been on the same dose of lisinopril for several years now.  He reports that he underwent routine outpatient labs on 08/16/2019 as monitoring of his stage IV chronic kidney disease.  He was subsequently contacted by his PCP regarding results of these labs, with convenience of interval worsening renal function as well as development of hyperkalemia, and associated instructions to present to the emergency department for further evaluation.  In tandem with these laboratory abnormalities concomitant with recent generalized weakness, the patient reports that he elected to present to [facility] long emergency department for further evaluation of the above.     Hospital Course:   Acute on chronic kidney disease stage IV -Baseline creatinine between 3.5 and 3.7. -Creatinine on admission was 6.54, slight decreased to 6.49 3/3, 6.49 3/4. Up slightly to 6.57 on 3/5. Further increased to 6.92 on 3/6. 6.15 on 3/7. 6.22 on 3/8. 6.26 on DC. -Nephrology is on board. -Foley placed 3/5. Will DC with foley in place. -Continue holding nephrotoxic agents including lisinopril as well as any NSAIDs. -Vein mapping complete. -Permanent access planned for later this morning. -Await nephrology  recs to see whether we are starting HD inpatient or outpatient (suspect it will be OP).  Hyperkalemia -With no EKG changes on admission. -He was treated with  insulin/D5, Lokelma, bicarbonate. -Potassium is 5.0 on DC.  Anemia of chronic disease -Due to advanced renal dysfunction. -Hemoglobin ws 8.6 and stable on DC.  Generalized weakness -Likely due to acute on chronic kidney disease. -Recent TSH was normal at 2.91. -There is no evidence of an acute infectious process at this time. -He remains on Zoloft for his depression. -HHPT/OT arranged.  Hypothyroidism -TSH is normal, continue home dose of Synthroid.  Chronic systolic heart failure -With the most recent echo from April 2014 at which time ejection fraction was 20 to 25% -This appears stable and compensated at this time. -Outpatient medications include Coreg, lisinopril and torsemide. Lisinopril is on hold.   Procedures:  Nephrology   Consultations:  Dialysis access placed 3/8  Discharge Instructions  Discharge Instructions    Diet - low sodium heart healthy   Complete by: As directed    Increase activity slowly   Complete by: As directed      Allergies as of 08/24/2019      Reactions   Sulfonamide Derivatives Itching, Rash      Medication List    STOP taking these medications   amLODipine 5 MG tablet Commonly known as: NORVASC   lisinopril 20 MG tablet Commonly known as: ZESTRIL     TAKE these medications   allopurinol 100 MG tablet Commonly known as: ZYLOPRIM Take 1 tablet (100 mg total) by mouth daily.   atorvastatin 20 MG tablet Commonly known as: LIPITOR Take 1 tablet (20 mg total) by mouth daily.   calcitRIOL 0.5 MCG capsule Commonly known as: ROCALTROL Take 1 capsule (0.5 mcg total) by mouth daily.   carvedilol 12.5 MG tablet Commonly known as: COREG Take 1 tablet (12.5 mg total) by mouth 2 (two) times daily.   HYDROcodone-acetaminophen 5-325 MG tablet Commonly known as: Norco Take 1 tablet by mouth every 6 (six) hours as needed for moderate pain.   levothyroxine 25 MCG tablet Commonly known as: SYNTHROID Take 1 tablet (25 mcg  total) by mouth daily before breakfast.   sertraline 50 MG tablet Commonly known as: ZOLOFT Take 1 tablet (50 mg total) by mouth daily.   sodium bicarbonate 650 MG tablet Take 2 tablets (1,300 mg total) by mouth 2 (two) times daily.   torsemide 20 MG tablet Commonly known as: DEMADEX Take 1 tablet (20 mg total) by mouth daily.   Vitamin D3 125 MCG (5000 UT) Caps Take 1 capsule by mouth daily.      Allergies  Allergen Reactions  . Sulfonamide Derivatives Itching and Rash   Follow-up Information    Early, Arvilla Meres, MD Follow up.   Specialties: Vascular Surgery, Cardiology Contact information: [address] [place] [address] [phone]        [name], MD. Schedule an appointment as soon as possible for a visit in 2 week(s).   Specialty: Family Medicine Contact information: [address] [place] [address] [phone]            The results of significant diagnostics from this hospitalization (including imaging, microbiology, ancillary and laboratory) are listed below for reference.    Significant Diagnostic Studies: US Renal  Result Date: 08/17/2019 CLINICAL DATA:  Acute kidney injury EXAM: RENAL / URINARY TRACT ULTRASOUND COMPLETE COMPARISON:  Ultrasound 08/08/2014, CT abdomen pelvis 07/16/2013 FINDINGS: Right Kidney: Renal  measurements: 7.6 x 4.6 x 4.0 cm = volume: 73.6 mL. Normal echogenicity. Few anechoic simple appearing thin walled cysts in the right kidney. Largest in the interpolar kidney measuring 2.4 x 2.2 x 1.5 cm. Smaller cysts measuring up to 1.5 and 1.0 cm are also visualized in the interpolar right kidney. No worrisome renal lesion. No hydronephrosis or shadowing calculus. Left Kidney: Renal measurements: 7.9 x 4.7 x 5.0 cm = volume: 98.3 mL. Normal echogenicity. Few anechoic simple appearing, thin walled cyst present in the left kidney as well. Largest cyst in the lower pole measuring 2.1 x 2.1 x 1.9 cm. Additional 2.1 and 1.8 cm  cysts are noted in the upper and interpolar kidney as well. No worrisome renal mass. No hydronephrosis or shadowing calculus. Bladder: Appears normal for degree of bladder distention. Bilateral bladder jets are identified. Other: Technically difficult exam given limited sonographic windows and bowel gas. IMPRESSION: Technically difficult exam due to limited sonographic windows and extensive bowel gas. Bilateral simple appearing renal cysts are present. Otherwise grossly unremarkable renal ultrasound. Electronically Signed   By: Lovena Le M.D.   On: 08/17/2019 21:25   VAS [place] UPPER EXT VEIN MAPPING (PRE-OP AVF)  Result Date: 08/20/2019 UPPER EXTREMITY VEIN MAPPING  Indications: Pre-access. Comparison Study: No prior exam. Performing Technologist: Baldwin Crown ARDMS, RVT  Examination Guidelines: A complete evaluation includes B-mode imaging, spectral Doppler, color Doppler, and power Doppler as needed of all accessible portions of each vessel. Bilateral testing is considered an integral part of a complete examination. Limited examinations for reoccurring indications may be performed as noted. +-----------------+-------------+----------+----------------+ Right Cephalic   Diameter (cm)Depth (cm)    Findings     +-----------------+-------------+----------+----------------+ Prox upper arm                          Non compressible +-----------------+-------------+----------+----------------+ Mid upper arm                           Non compressible +-----------------+-------------+----------+----------------+ Dist upper arm                          Non compressible +-----------------+-------------+----------+----------------+ Antecubital fossa                       Non compressible +-----------------+-------------+----------+----------------+ Prox forearm                             not visualized  +-----------------+-------------+----------+----------------+ Mid forearm                               not visualized  +-----------------+-------------+----------+----------------+ Dist forearm                             not visualized  +-----------------+-------------+----------+----------------+ Wrist                                    not visualized  +-----------------+-------------+----------+----------------+ +--------------+-------------+----------+--------------+ Right Basilic Diameter (cm)Depth (cm)   Findings    +--------------+-------------+----------+--------------+ Prox upper arm                       not visualized +--------------+-------------+----------+--------------+ Wrist  not visualized +--------------+-------------+----------+--------------+ +--------------+-------------+----------+--------------+ Left Cephalic Diameter (cm)Depth (cm)   Findings    +--------------+-------------+----------+--------------+ Prox upper arm                       not visualized +--------------+-------------+----------+--------------+ Wrist                                not visualized +--------------+-------------+----------+--------------+ +--------------+-------------+----------+--------------+ Left Basilic  Diameter (cm)Depth (cm)   Findings    +--------------+-------------+----------+--------------+ Prox upper arm                       not visualized +--------------+-------------+----------+--------------+ Wrist                                not visualized +--------------+-------------+----------+--------------+ Summary: Right: Right cephalic vein non compressible and thrombus visualized        adjacent to IV in antecubital fossa. Right basilic vein not        visualized. Left: Left cephalic and basilic veins not visualized. *See table(s) above for measurements and observations.  Diagnosing physician: Monica Martinez MD Electronically signed by Monica Martinez MD on 08/20/2019 at 4:32:09 PM.    Final      Microbiology: Recent Results (from the past 240 hour(s))  SARS CORONAVIRUS 2 (TAT 6-24 HRS) Nasopharyngeal Nasopharyngeal Swab     Status: None   Collection Time: 08/17/19  7:59 PM   Specimen: Nasopharyngeal Swab  Result Value Ref Range Status   SARS Coronavirus 2 NEGATIVE NEGATIVE Final    Comment: (NOTE) SARS-CoV-2 target nucleic acids are NOT DETECTED. The SARS-CoV-2 RNA is generally detectable in upper and lower respiratory specimens during the acute phase of infection. Negative results do not preclude SARS-CoV-2 infection, do not rule out co-infections with other pathogens, and should not be used as the sole basis for treatment or other patient management decisions. Negative results must be combined with clinical observations, patient history, and epidemiological information. The expected result is Negative. Fact Sheet for Patients: SugarRoll.be Fact Sheet for Healthcare Providers: https://www.woods-mathews.com/ This test is not yet approved or cleared by the [place] FDA and  has been authorized for detection and/or diagnosis of SARS-CoV-2 by FDA under an Emergency Use Authorization (EUA). This EUA will remain  in effect (meaning this test can be used) for the duration of the COVID-19 declaration under Section 56 4(b)(1) of the Act, 21 U.S.C. section 360bbb-3(b)(1), unless the authorization is terminated or revoked sooner. Performed at [facility], [place] [address] [place], [place] [address]   MRSA PCR Screening     Status: None   Collection Time: 08/18/19  1:27 AM   Specimen: Nasal Mucosa; Nasopharyngeal  Result Value Ref Range Status   MRSA by PCR NEGATIVE NEGATIVE Final    Comment:        The GeneXpert MRSA Assay (FDA approved for NASAL specimens only), is one component of a comprehensive MRSA colonization surveillance program. It is not intended to diagnose MRSA infection nor to guide or monitor treatment  for MRSA infections. Performed at [facility], [place] [address], [place], [place] [address]   Surgical pcr screen     Status: None   Collection Time: 08/22/19  9:54 PM   Specimen: Nasal Mucosa; Nasal Swab  Result Value Ref Range Status   MRSA, PCR NEGATIVE NEGATIVE Final   Staphylococcus  aureus NEGATIVE NEGATIVE Final    Comment: (NOTE) The Xpert SA Assay (FDA approved for NASAL specimens in patients 43 years of age and older), is one component of a comprehensive surveillance program. It is not intended to diagnose infection nor to guide or monitor treatment. Performed at [facility], [place] [address], [place], [place] [address]      Labs: Basic Metabolic Panel: Recent Labs  Lab 08/17/19 2030 08/17/19 2030 08/18/19 0725 08/19/19 0534 08/20/19 4627 08/21/19 0346 08/22/19 0426 08/23/19 0439 08/24/19 0403  NA 138   < > 137   < > 137 140 140 141 140  K 5.2*   < > 4.8   < > 4.7 4.8 4.5 4.9 5.0  CL 107   < > 105   < > 105 108 109 108 107  CO2 19*   < > 18*   < > 18* 18* 18* 18* 18*  GLUCOSE 50*   < > 97   < > 100* 96 85 105* 144*  BUN 137*   < > 131*   < > 135* 140* 135* 135* 137*  CREATININE 6.54*   < > 6.49*   < > 6.57* 6.92* 6.15* 6.22* 6.26*  CALCIUM 10.0   < > 9.8   < > 9.2 9.2 9.1 9.3 9.0  MG 1.6*  --  1.4*  --   --   --   --   --   --   PHOS  --   --  6.7*  --   --   --   --   --  6.0*   < > = values in this interval not displayed.   Liver Function Tests: Recent Labs  Lab 08/17/19 1633 08/18/19 0725 08/24/19 0403  AST 18  --   --   ALT 15  --   --   ALKPHOS 49  --   --   BILITOT 0.6  --   --   PROT 8.1  --   --   ALBUMIN 4.2 3.8 3.2*   No results for input(s): LIPASE, AMYLASE in the last 168 hours. No results for input(s): AMMONIA in the last 168 hours. CBC: Recent Labs  Lab 08/17/19 1633 08/17/19 1633 08/18/19 0725 08/19/19 0534 08/20/19 0656 08/21/19 0346 08/24/19 0403  WBC 5.6   < > 5.3 5.1 3.1* 3.8* 6.0  NEUTROABS 3.5  --   --   --    --   --   --   HGB 9.4*   < > 9.9* 10.0* 8.7* 8.8* 8.6*  HCT 29.9*   < > 30.7* 31.1* 27.3* 26.6* 26.1*  MCV 86.9   < > 85.5 86.4 84.8 82.6 82.9  PLT 112*   < > 108* 108* 86* 92* 128*   < > = values in this interval not displayed.   Cardiac Enzymes: No results for input(s): CKTOTAL, CKMB, CKMBINDEX, TROPONINI in the last 168 hours. BNP: BNP (last 3 results) No results for input(s): BNP in the last 8760 hours.  ProBNP (last 3 results) No results for input(s): PROBNP in the last 8760 hours.  CBG: Recent Labs  Lab 08/18/19 0858 08/23/19 0831 08/23/19 1055  GLUCAP 80 84 97       Signed:  [facility] Pager: [phone] 08/24/2019, 12:22 PM

## 2019-08-24 NOTE — Plan of Care (Signed)
  Problem: Nutrition: Goal: Adequate nutrition will be maintained Outcome: Progressing   

## 2019-08-24 NOTE — Progress Notes (Signed)
Occupational Therapy Treatment Patient Details Name: Tyler Hahn MRN: [id number] DOB: 25-Apr-1941 Today's Date: 08/24/2019    History of present illness 79 yo male admitted to ED on 3/2 for LE weakness, AKI. PMH includes CKD IV, anxiety, AICD, CHF with EF 20-25%, DM, gout, HLD, HTN.   OT comments  Pt making good progress with functional goals. Session focused on LB selfcare, standing activity tolerance/safety and ADL mobility. Pt very pleasant, talkative and cooperative. OT will continue to follow acutely  Follow Up Recommendations  Home health OT;Supervision/Assistance - 24 hour    Equipment Recommendations       Recommendations for Other Services      Precautions / Restrictions Precautions Precautions: Fall Restrictions Weight Bearing Restrictions: No       Mobility Bed Mobility Overal bed mobility: Needs Assistance Bed Mobility: Supine to Sit     Supine to sit: HOB elevated;Supervision        Transfers Overall transfer level: Needs assistance Equipment used: None;Rolling walker (2 wheeled) Transfers: Sit to/from Stand Sit to Stand: Supervision              Balance Overall balance assessment: Needs assistance Sitting-balance support: No upper extremity supported;Feet supported Sitting balance-Leahy Scale: Good     Standing balance support: No upper extremity supported;During functional activity Standing balance-Leahy Scale: Good Standing balance comment: supervision                           ADL either performed or assessed with clinical judgement   ADL Overall ADL's : Needs assistance/impaired Eating/Feeding: Independent;Sitting   Grooming: Wash/dry hands;Wash/dry face;Supervision/safety;Standing   Upper Body Bathing: Set up;Sitting   Lower Body Bathing: Min guard;Sit to/from stand   Upper Body Dressing : Set up;Sitting   Lower Body Dressing: Min guard;Sit to/from stand   Toilet Transfer: Supervision/safety;Ambulation    Toileting- Clothing Manipulation and Hygiene: Supervision/safety;Sit to/from stand       Functional mobility during ADLs: Supervision/safety       Vision Baseline Vision/History: Wears glasses Wears Glasses: At all times Patient Visual Report: No change from baseline     Perception     Praxis      Cognition Arousal/Alertness: Awake/alert Behavior During Therapy: WFL for tasks assessed/performed Overall Cognitive Status: No family/caregiver present to determine baseline cognitive functioning Area of Impairment: Orientation;Memory;Problem solving                     Memory: Decreased short-term memory Following Commands: Follows one step commands consistently     Problem Solving: Slow processing;Decreased initiation;Requires verbal cues          Exercises     Shoulder Instructions       General Comments      Pertinent Vitals/ Pain       Faces Pain Scale: No hurt Pain Intervention(s): Monitored during session  Home Living                                          Prior Functioning/Environment              Frequency  Min 2X/week        Progress Toward Goals  OT Goals(current goals can now be found in the care plan section)  Progress towards OT goals: Progressing toward goals  Acute Rehab OT Goals Patient Stated Goal: go home  Plan Discharge plan remains appropriate    Co-evaluation                 AM-PAC OT "6 Clicks" Daily Activity     Outcome Measure   Help from another person eating meals?: None Help from another person taking care of personal grooming?: None Help from another person toileting, which includes using toliet, bedpan, or urinal?: A Little Help from another person bathing (including washing, rinsing, drying)?: A Little Help from another person to put on and taking off regular upper body clothing?: None Help from another person to put on and taking off regular lower body clothing?: A  Little 6 Click Score: 21    End of Session Equipment Utilized During Treatment: Gait belt;Rolling walker  OT Visit Diagnosis: Other symptoms and signs involving cognitive function;Muscle weakness (generalized) (M62.81)   Activity Tolerance Patient tolerated treatment well   Patient Left in chair;with call bell/phone within reach;with chair alarm set   Nurse Communication          Time: [id number] OT Time Calculation (min): 35 min  Charges: OT General Charges $OT Visit: 1 Visit OT Treatments $Self Care/Home Management : 8-22 mins $Therapeutic Activity: 8-22 mins     Emmit Alexanders [facility] 08/24/2019, 11:13 AM

## 2019-08-24 NOTE — Plan of Care (Signed)
  Problem: Education: Goal: Knowledge of General Education information will improve Description: Including pain rating scale, medication(s)/side effects and non-pharmacologic comfort measures 08/24/2019 1457 by Baldo Ash, RN Outcome: Adequate for Discharge 08/24/2019 1001 by Baldo Ash, RN Outcome: Progressing   Problem: Health Behavior/Discharge Planning: Goal: Ability to manage health-related needs will improve Outcome: Adequate for Discharge   Problem: Clinical Measurements: Goal: Ability to maintain clinical measurements within normal limits will improve Outcome: Adequate for Discharge Goal: Will remain free from infection Outcome: Adequate for Discharge Goal: Diagnostic test results will improve Outcome: Adequate for Discharge Goal: Respiratory complications will improve Outcome: Adequate for Discharge Goal: Cardiovascular complication will be avoided Outcome: Adequate for Discharge   Problem: Activity: Goal: Risk for activity intolerance will decrease Outcome: Adequate for Discharge   Problem: Nutrition: Goal: Adequate nutrition will be maintained 08/24/2019 1457 by Baldo Ash, RN Outcome: Adequate for Discharge 08/24/2019 [phone] by Baldo Ash, RN Outcome: Progressing   Problem: Coping: Goal: Level of anxiety will decrease Outcome: Adequate for Discharge   Problem: Elimination: Goal: Will not experience complications related to bowel motility Outcome: Adequate for Discharge Goal: Will not experience complications related to urinary retention Outcome: Adequate for Discharge   Problem: Pain Managment: Goal: General experience of comfort will improve Outcome: Adequate for Discharge   Problem: Safety: Goal: Ability to remain free from injury will improve Outcome: Adequate for Discharge   Problem: Skin Integrity: Goal: Risk for impaired skin integrity will decrease Outcome: Adequate for Discharge   Problem: Acute Rehab PT Goals(only PT should  resolve) Goal: Pt Will Go Supine/Side To Sit Outcome: Adequate for Discharge Goal: Patient Will Transfer Sit To/From Stand Outcome: Adequate for Discharge Goal: Pt Will Ambulate Outcome: Adequate for Discharge Goal: Pt/caregiver will Perform Home Exercise Program Outcome: Adequate for Discharge   Problem: Acute Rehab OT Goals (only OT should resolve) Goal: Pt. Will Perform Grooming Outcome: Adequate for Discharge Goal: Pt. Will Perform Lower Body Dressing Outcome: Adequate for Discharge Goal: Pt. Will Transfer To Toilet Outcome: Adequate for Discharge Goal: Pt. Will Perform Tub/Shower Transfer Outcome: Adequate for Discharge Goal: OT Additional ADL Goal #1 Outcome: Adequate for Discharge

## 2019-08-24 NOTE — Progress Notes (Signed)
Physical Therapy Treatment Patient Details Name: Tyler Hahn MRN: [id number] DOB: Sep 12, 1940 Today's Date: 08/24/2019    History of Present Illness 79 yo male admitted to ED on 3/2 for LE weakness, AKI. PMH includes CKD IV, anxiety, AICD, CHF with EF 20-25%, DM, gout, HLD, HTN.    PT Comments    Pt ambulated good hallway distance with RW this session, pt struggles somewhat with sit to stands prior to gait initiation but per pt he uses lift chair at home. During gait, pt states "I feel drunk" - when asked to clarify pt states no acute visual changes but says his vision is failing and he has improper glasses. PT discussed the need for use of correct glasses and seeing eye doctor. Pt did not correlate poor vision with safety risk at home and with driving, states "I am going to drive myself home today" (PT discouraged this). PT spoke with pt's son on the phone about safety concerns and father's current weakness, and pt's son [name] states he  understands. [name] and other brother plan to stay with pt during the day while recovering. PT to continue to follow acutely.    Follow Up Recommendations  Home health PT;Supervision for mobility/OOB     Equipment Recommendations  None recommended by PT    Recommendations for Other Services       Precautions / Restrictions Precautions Precautions: Fall Restrictions Weight Bearing Restrictions: No    Mobility  Bed Mobility Overal bed mobility: Needs Assistance Bed Mobility: Supine to Sit     Supine to sit: HOB elevated;Supervision     General bed mobility comments: OOB in chair upon PT arrival, requesting stay in chair upon PT exit.  Transfers Overall transfer level: Needs assistance Equipment used: Rolling walker (2 wheeled) Transfers: Sit to/from Stand Sit to Stand: Min guard         General transfer comment: Min guard for safety, increased time to rise and steady. Pt with self-cuing to push up from recliner  armrests.  Ambulation/Gait Ambulation/Gait assistance: Min guard Gait Distance (Feet): 200 Feet Assistive device: Rolling walker (2 wheeled) Gait Pattern/deviations: Step-through pattern;Decreased stride length;Shuffle;Trunk flexed Gait velocity: reduced   General Gait Details: Min guard for safety, repeated verbal cuing for placement in RW, upright posture. During gait, pt states "I feel drunk" - when asked to clarify pt states no acute visual changes but says his vision is failing and he has improper glasses.   Stairs             Wheelchair Mobility    Modified Rankin (Stroke Patients Only)       Balance Overall balance assessment: Needs assistance Sitting-balance support: No upper extremity supported;Feet supported Sitting balance-Leahy Scale: Good     Standing balance support: No upper extremity supported;During functional activity Standing balance-Leahy Scale: Fair Standing balance comment: able to stand without UE support, but requires RW for dynamic standing                            Cognition Arousal/Alertness: Awake/alert Behavior During Therapy: WFL for tasks assessed/performed Overall Cognitive Status: No family/caregiver present to determine baseline cognitive functioning Area of Impairment: Memory;Problem solving                     Memory: Decreased short-term memory Following Commands: Follows one step commands consistently Safety/Judgement: Decreased awareness of safety   Problem Solving: Slow processing;Decreased initiation;Requires verbal cues General Comments: Pt pleasant,  talkative. Pt stating "I feel drunk" x2 during session, when asked pt to clarify and asked about changes in vision, pt states "I've felt like this for 2 months now with my vision". Pt with limited understanding of the impact of vision of safety in home, driving.      Exercises      General Comments        Pertinent Vitals/Pain Pain Assessment:  No/denies pain Faces Pain Scale: No hurt Pain Intervention(s): Monitored during session    Home Living                      Prior Function            PT Goals (current goals can now be found in the care plan section) Acute Rehab PT Goals Patient Stated Goal: go home PT Goal Formulation: With patient Time For Goal Achievement: 09/01/19 Potential to Achieve Goals: Good Progress towards PT goals: Progressing toward goals    Frequency    Min 3X/week      PT Plan Current plan remains appropriate    Co-evaluation              AM-PAC PT "6 Clicks" Mobility   Outcome Measure  Help needed turning from your back to your side while in a flat bed without using bedrails?: None Help needed moving from lying on your back to sitting on the side of a flat bed without using bedrails?: None Help needed moving to and from a bed to a chair (including a wheelchair)?: A Little Help needed standing up from a chair using your arms (e.g., wheelchair or bedside chair)?: A Little Help needed to walk in hospital room?: A Little Help needed climbing 3-5 steps with a railing? : A Little 6 Click Score: 20    End of Session Equipment Utilized During Treatment: Gait belt Activity Tolerance: Patient tolerated treatment well Patient left: in chair;with call bell/phone within reach;with chair alarm set Nurse Communication: Mobility status;Other (comment) PT Visit Diagnosis: Unsteadiness on feet (R26.81);Muscle weakness (generalized) (M62.81)     Time: 1314-3888 PT Time Calculation (min) (ACUTE ONLY): 19 min  Charges:  $Gait Training: 8-22 mins                    Terrye Dombrosky E, PT [place] Pager [phone]  Office [phone]   Gaetano Romberger D Vickey Ewbank 08/24/2019, 2:09 PM

## 2019-08-24 NOTE — Progress Notes (Signed)
Tyler Hahn to be discharged home per MD order. Discussed prescriptions and follow up appointments with the patient. Prescriptions given to patient; medication list explained in detail. Patient verbalized understanding.  Skin clean, dry and intact without evidence of skin break down, no evidence of skin tears noted. IV catheter discontinued intact. Site without signs and symptoms of complications. Dressing and pressure applied. Pt denies pain at the site currently. No complaints noted.  Patient free of lines, drains, and wounds.   An After Visit Summary (AVS) was printed and given to the patient. Patient escorted via wheelchair, and discharged home via private auto.  Baldo Ash, RN

## 2019-08-24 NOTE — TOC Transition Note (Addendum)
Transition of Care [facility]) - CM/SW Discharge Note   Patient Details  Name: Tyler Hahn MRN: [id number] Date of Birth: [birth year]  [facility]) CM/SW Contact:  Bartholomew Crews, RN Phone Number: [phone] 08/24/2019, 1:43 PM   Clinical Narrative:    Spoke with patient at the bedside about choice of home health agency. NCM working on finding agency who can accept referral for [facility] PT and OT. [facility] orders already placed by MD.   Patient to return to his apartment with family checking in on him.   Update for 1407: [facility] accepted referral for PT and OT with start of care this weekend. [facility] to call patient on Friday to schedule visit. Discussed accepting [facility] agency with patient and start of care for the weekend - patient verbalized agreement.   Patient states that his son, [name], is picking him up to transport home.   No further TOC needs identified at this time.   Final next level of care: [place] Barriers to Discharge: No Barriers Identified   Patient Goals and CMS Choice Patient states their goals for this hospitalization and ongoing recovery are:: to return to home CMS Medicare.gov Compare Post Acute Care list provided to:: Patient Choice offered to / list presented to : Patient  Discharge Placement                       Discharge Plan and Services     Post Acute Care Choice: Home Health          DME Arranged: N/A DME Agency: NA       HH Arranged: PT, OT          Social Determinants of Health (SDOH) Interventions     Readmission Risk Interventions No flowsheet data found.

## 2019-08-24 NOTE — Progress Notes (Signed)
[facility] Progress Note   79 y.o.male HTN, DM, HLD, NICM s/p ICD placement, and CKD stage IV (followed by Dr. Joelyn Oms ) noted to have AKI and hyperkalemia. He was instructed to to the ED to be further evaluated. labs were rechecked with worsening renal function, azotemia, and hyperkalemia. He did lose his wife of over 50 years several weeks ago (she was on dialysis for a short time but did not do well). He denies any NSAIDs or COX-II I's but has been on lisinopril and torsemide prior to admission.  Assessment/ Plan:   1. AKI/CKD stage IV- suspect bladder outlet obstruction given palpable suprapubic mass and bladder scan with 675 ml. Renal US- technically difficult, small kidneys but no hydro. 1. Continue holdinglisinopril; restart  Torsemide;   Continue NaHCO3 2 tabs bid. 2. No uremic symptoms so no urgent need for dialysis; not much improvement in renal  function despite  insertion but UOP much better.  3. Question is whether damage has already been done.   s/p AVG on 3/8- looks good.   if numbers remain stable with good UOP and not obviously uremic ok  to f/u closely with [facility] with initiation of dialysis non emergently through the perm access in a few weeks.  The only caveat is if he does not feel strong enough to go home, that might be our indication to start.  He tells me today that he feels well enough to go home-  Will arrange follow up next week at [facility] 2. Hyperkalemia- no ECG changes.  helped  with relief of bladder outlet obstruction and foley catheter placement. 3. Anemia of CKD stage IV- TSAT 31! With Ferritin of 244 -> given Aranesp 60 mcg sq 3/5 4. HTN- stable and follow off of lisinopril. May need to start hydralazine or amlodipine if BP rises. 5. DM- per primary 6. NICMP s/p AICD- stable 7. CKD stage IV- felt to be due to DM and followed closely by Dr. Joelyn Oms. He is leaning towards PD if he needs to start dialysis but has agreed to backup avf  placement. 8. Thrombocytopenia- continue to follow- trended up today   Subjective:    at least 650  of UOP - kidney function numbers look pretty stable but poor-  In bedside chair reading paper-  Really wants to try to go home and start HD as OP once AVG mature   Objective:   BP (!) 112/59 (BP Location: Left Arm)   Pulse 74   Temp 97.7 F (36.5 C) (Oral)   Resp 18   Ht 5\' 8"  (1.727 m)   Wt 74.1 kg   SpO2 (!) 89%   BMI 24.84 kg/m   Intake/Output Summary (Last 24 hours) at 08/24/2019 1158 Last data filed at 08/24/2019 0800 Gross per 24 hour  Intake 600 ml  Output 550 ml  Net 50 ml   Weight change:   Physical Exam: General appearance:NAD, NCAT Resp:clear to auscultation bilaterallywith no rales noted Cardio:regular rate and rhythm, no rub andIII/VI SEM around precordium DX:IPJA, nontender, +BS Extremities:extremities normal, atraumatic, no cyanosis or edema- new right AVG-  Good thrill and bruit-  Not much swelling  Neuro: no asterixis GU: foley to gravity  Imaging: No results found.  Labs: BMET Recent Labs  Lab 08/18/19 0725 08/19/19 0534 08/20/19 0656 08/21/19 0346 08/22/19 0426 08/23/19 0439 08/24/19 0403  NA 137 138 137 140 140 141 140  K 4.8 4.9 4.7 4.8 4.5 4.9 5.0  CL 105 102 105 108 109  108 107  CO2 18* 18* 18* 18* 18* 18* 18*  GLUCOSE 97 155* 100* 96 85 105* 144*  BUN 131* 134* 135* 140* 135* 135* 137*  CREATININE 6.49* 6.49* 6.57* 6.92* 6.15* 6.22* 6.26*  CALCIUM 9.8 9.9 9.2 9.2 9.1 9.3 9.0  PHOS 6.7*  --   --   --   --   --  6.0*   CBC Recent Labs  Lab 08/17/19 1633 08/18/19 0725 08/19/19 0534 08/20/19 0656 08/21/19 0346 08/24/19 0403  WBC 5.6   < > 5.1 3.1* 3.8* 6.0  NEUTROABS 3.5  --   --   --   --   --   HGB 9.4*   < > 10.0* 8.7* 8.8* 8.6*  HCT 29.9*   < > 31.1* 27.3* 26.6* 26.1*  MCV 86.9   < > 86.4 84.8 82.6 82.9  PLT 112*   < > 108* 86* 92* 128*   < > = values in this interval not displayed.    Medications:    .  albuterol  10 mg Nebulization Once  . atorvastatin  20 mg Oral Daily  . carvedilol  12.5 mg Oral BID  . Chlorhexidine Gluconate Cloth  6 each Topical Daily  . levothyroxine  25 mcg Oral QAC breakfast  . sodium bicarbonate  1,300 mg Oral BID  . sodium chloride flush  3 mL Intravenous Q12H  . torsemide  20 mg Oral Daily      Devynn Scheff A Dianely Krehbiel  08/24/2019, 11:58 AM

## 2019-08-25 ENCOUNTER — Telehealth: Payer: Self-pay

## 2019-08-25 NOTE — Telephone Encounter (Signed)
Transition Care Management Follow-up Telephone Call  Date of discharge and from where: [facility] 08[facility]  How have you been since you were released from the hospital? Stable  Any questions or concerns? No   Items Reviewed:  Did the pt receive and understand the discharge instructions provided? Yes   Medications obtained and verified? Yes   Any new allergies since your discharge? Yes   Dietary orders reviewed? Yes  Do you have support at home? Yes   Functional Questionnaire: (I = Independent and D = Dependent) ADLs: I  Bathing/Dressing- I  Meal Prep- I  Eating- I  Maintaining continence- Currently has foley catheter   Transferring/Ambulation- I   Managing Meds- I with assistance from family   Follow up appointments reviewed:   PCP Hospital f/u appt confirmed? Yes  Scheduled to see Dr. Birdie Riddle on 08/31/19 @ 11.  [facility] f/u appt confirmed? Son aware that patient is to follow up with vascular and nephrology.  Has received appointment with vascular for 09/14/19 @ 1015.  Is awaiting to hear back from nephrology (Dr. Joelyn Oms)    Are transportation arrangements needed? No   If their condition worsens, is the pt aware to call PCP or go to the Emergency Dept.? Yes  Was the patient provided with contact information for the PCP's office or ED? Yes  Was to pt encouraged to call back with questions or concerns? Yes

## 2019-08-30 ENCOUNTER — Encounter: Payer: Self-pay | Admitting: Emergency Medicine

## 2019-08-30 ENCOUNTER — Other Ambulatory Visit: Payer: Self-pay

## 2019-08-30 ENCOUNTER — Emergency Department: Payer: Medicare HMO

## 2019-08-30 ENCOUNTER — Inpatient Hospital Stay
Admission: EM | Admit: 2019-08-30 | Discharge: 2019-09-04 | DRG: 698 | Disposition: A | Discharge status: Home Health | Payer: Medicare HMO | Attending: Internal Medicine | Admitting: Internal Medicine

## 2019-08-30 DIAGNOSIS — Z8379 Family history of other diseases of the digestive system: Secondary | ICD-10-CM

## 2019-08-30 DIAGNOSIS — R7881 Bacteremia: Secondary | ICD-10-CM | POA: Diagnosis present

## 2019-08-30 DIAGNOSIS — N186 End stage renal disease: Secondary | ICD-10-CM | POA: Diagnosis not present

## 2019-08-30 DIAGNOSIS — Z841 Family history of disorders of kidney and ureter: Secondary | ICD-10-CM

## 2019-08-30 DIAGNOSIS — B965 Pseudomonas (aeruginosa) (mallei) (pseudomallei) as the cause of diseases classified elsewhere: Secondary | ICD-10-CM

## 2019-08-30 DIAGNOSIS — T83511A Infection and inflammatory reaction due to indwelling urethral catheter, initial encounter: Principal | ICD-10-CM | POA: Diagnosis present

## 2019-08-30 DIAGNOSIS — M109 Gout, unspecified: Secondary | ICD-10-CM | POA: Diagnosis present

## 2019-08-30 DIAGNOSIS — Z20822 Contact with and (suspected) exposure to covid-19: Secondary | ICD-10-CM | POA: Diagnosis not present

## 2019-08-30 DIAGNOSIS — R0602 Shortness of breath: Secondary | ICD-10-CM | POA: Diagnosis not present

## 2019-08-30 DIAGNOSIS — N390 Urinary tract infection, site not specified: Secondary | ICD-10-CM

## 2019-08-30 DIAGNOSIS — Z992 Dependence on renal dialysis: Secondary | ICD-10-CM | POA: Diagnosis not present

## 2019-08-30 DIAGNOSIS — Z79890 Hormone replacement therapy (postmenopausal): Secondary | ICD-10-CM

## 2019-08-30 DIAGNOSIS — K760 Fatty (change of) liver, not elsewhere classified: Secondary | ICD-10-CM | POA: Diagnosis present

## 2019-08-30 DIAGNOSIS — E1122 Type 2 diabetes mellitus with diabetic chronic kidney disease: Secondary | ICD-10-CM | POA: Diagnosis present

## 2019-08-30 DIAGNOSIS — E785 Hyperlipidemia, unspecified: Secondary | ICD-10-CM | POA: Diagnosis present

## 2019-08-30 DIAGNOSIS — Z87891 Personal history of nicotine dependence: Secondary | ICD-10-CM

## 2019-08-30 DIAGNOSIS — G9341 Metabolic encephalopathy: Secondary | ICD-10-CM | POA: Diagnosis not present

## 2019-08-30 DIAGNOSIS — N179 Acute kidney failure, unspecified: Secondary | ICD-10-CM | POA: Diagnosis present

## 2019-08-30 DIAGNOSIS — R799 Abnormal finding of blood chemistry, unspecified: Secondary | ICD-10-CM

## 2019-08-30 DIAGNOSIS — E875 Hyperkalemia: Secondary | ICD-10-CM

## 2019-08-30 DIAGNOSIS — Z820 Family history of epilepsy and other diseases of the nervous system: Secondary | ICD-10-CM

## 2019-08-30 DIAGNOSIS — N189 Chronic kidney disease, unspecified: Secondary | ICD-10-CM | POA: Diagnosis not present

## 2019-08-30 DIAGNOSIS — N2581 Secondary hyperparathyroidism of renal origin: Secondary | ICD-10-CM | POA: Diagnosis present

## 2019-08-30 DIAGNOSIS — E872 Acidosis: Secondary | ICD-10-CM | POA: Diagnosis present

## 2019-08-30 DIAGNOSIS — I10 Essential (primary) hypertension: Secondary | ICD-10-CM

## 2019-08-30 DIAGNOSIS — Z882 Allergy status to sulfonamides status: Secondary | ICD-10-CM

## 2019-08-30 DIAGNOSIS — A498 Other bacterial infections of unspecified site: Secondary | ICD-10-CM

## 2019-08-30 DIAGNOSIS — Y846 Urinary catheterization as the cause of abnormal reaction of the patient, or of later complication, without mention of misadventure at the time of the procedure: Secondary | ICD-10-CM | POA: Diagnosis present

## 2019-08-30 DIAGNOSIS — E039 Hypothyroidism, unspecified: Secondary | ICD-10-CM | POA: Diagnosis present

## 2019-08-30 DIAGNOSIS — I5022 Chronic systolic (congestive) heart failure: Secondary | ICD-10-CM | POA: Diagnosis present

## 2019-08-30 DIAGNOSIS — R531 Weakness: Secondary | ICD-10-CM

## 2019-08-30 DIAGNOSIS — I120 Hypertensive chronic kidney disease with stage 5 chronic kidney disease or end stage renal disease: Secondary | ICD-10-CM | POA: Diagnosis not present

## 2019-08-30 DIAGNOSIS — Z95810 Presence of automatic (implantable) cardiac defibrillator: Secondary | ICD-10-CM

## 2019-08-30 DIAGNOSIS — Z8249 Family history of ischemic heart disease and other diseases of the circulatory system: Secondary | ICD-10-CM

## 2019-08-30 DIAGNOSIS — Z833 Family history of diabetes mellitus: Secondary | ICD-10-CM

## 2019-08-30 DIAGNOSIS — I132 Hypertensive heart and chronic kidney disease with heart failure and with stage 5 chronic kidney disease, or end stage renal disease: Secondary | ICD-10-CM | POA: Diagnosis present

## 2019-08-30 DIAGNOSIS — E1169 Type 2 diabetes mellitus with other specified complication: Secondary | ICD-10-CM

## 2019-08-30 DIAGNOSIS — Z794 Long term (current) use of insulin: Secondary | ICD-10-CM

## 2019-08-30 DIAGNOSIS — Z800 Family history of malignant neoplasm of digestive organs: Secondary | ICD-10-CM

## 2019-08-30 DIAGNOSIS — M1990 Unspecified osteoarthritis, unspecified site: Secondary | ICD-10-CM | POA: Diagnosis present

## 2019-08-30 DIAGNOSIS — F329 Major depressive disorder, single episode, unspecified: Secondary | ICD-10-CM | POA: Diagnosis present

## 2019-08-30 DIAGNOSIS — D631 Anemia in chronic kidney disease: Secondary | ICD-10-CM | POA: Diagnosis present

## 2019-08-30 DIAGNOSIS — I429 Cardiomyopathy, unspecified: Secondary | ICD-10-CM | POA: Diagnosis present

## 2019-08-30 LAB — URINALYSIS, COMPLETE (UACMP) WITH MICROSCOPIC
Bilirubin Urine: NEGATIVE
Glucose, UA: NEGATIVE mg/dL
Ketones, ur: NEGATIVE mg/dL
Nitrite: POSITIVE — AB
Protein, ur: 30 mg/dL — AB
Specific Gravity, Urine: 1.011 (ref 1.005–1.030)
WBC, UA: >50 WBC/hpf — ABNORMAL HIGH (ref 0–5)
pH: 5 (ref 5.0–8.0)

## 2019-08-30 LAB — RESPIRATORY PANEL BY RT PCR (FLU A&B, COVID)
Influenza A by PCR: NEGATIVE
Influenza B by PCR: NEGATIVE
SARS Coronavirus 2 by RT PCR: NEGATIVE

## 2019-08-30 LAB — BASIC METABOLIC PANEL
Anion gap: 17 — ABNORMAL HIGH (ref 5–15)
BUN: 142 mg/dL — ABNORMAL HIGH (ref 8–23)
CO2: 17 mmol/L — ABNORMAL LOW (ref 22–32)
Calcium: 9.4 mg/dL (ref 8.9–10.3)
Chloride: 104 mmol/L (ref 98–111)
Creatinine, Ser: 6.58 mg/dL — ABNORMAL HIGH (ref 0.61–1.24)
GFR calc Af Amer: 9 mL/min — ABNORMAL LOW (ref >60)
GFR calc non Af Amer: 7 mL/min — ABNORMAL LOW (ref >60)
Glucose, Bld: 156 mg/dL — ABNORMAL HIGH (ref 70–99)
Potassium: 5.2 mmol/L — ABNORMAL HIGH (ref 3.5–5.1)
Sodium: 138 mmol/L (ref 135–145)

## 2019-08-30 LAB — CBC
HCT: 28.6 % — ABNORMAL LOW (ref 39.0–52.0)
Hemoglobin: 9.3 g/dL — ABNORMAL LOW (ref 13.0–17.0)
MCH: 27.8 pg (ref 26.0–34.0)
MCHC: 32.5 g/dL (ref 30.0–36.0)
MCV: 85.4 fL (ref 80.0–100.0)
Platelets: 142 10*3/uL — ABNORMAL LOW (ref 150–400)
RBC: 3.35 MIL/uL — ABNORMAL LOW (ref 4.22–5.81)
RDW: 14.6 % (ref 11.5–15.5)
WBC: 18.5 10*3/uL — ABNORMAL HIGH (ref 4.0–10.5)
nRBC: 0 % (ref 0.0–0.2)

## 2019-08-30 LAB — TROPONIN I (HIGH SENSITIVITY): Troponin I (High Sensitivity): 42 ng/L — ABNORMAL HIGH (ref <18)

## 2019-08-30 LAB — TSH: TSH: 4.183 u[IU]/mL (ref 0.350–4.500)

## 2019-08-30 LAB — T4, FREE: Free T4: 0.91 ng/dL (ref 0.61–1.12)

## 2019-08-30 MED ORDER — ALLOPURINOL 100 MG PO TABS
100.0000 mg | ORAL_TABLET | Freq: Every day | ORAL | Status: DC
  Administered 2019-08-31 – 2019-09-04 (×5): 100 mg via ORAL
  Filled 2019-08-30 (×5): qty 1

## 2019-08-30 MED ORDER — HEPARIN SODIUM (PORCINE) 5000 UNIT/ML IJ SOLN
5000.0000 [IU] | Freq: Three times a day (TID) | INTRAMUSCULAR | Status: DC
  Administered 2019-08-31 – 2019-09-04 (×14): 5000 [IU] via SUBCUTANEOUS
  Filled 2019-08-30 (×15): qty 1

## 2019-08-30 MED ORDER — SERTRALINE HCL 50 MG PO TABS
50.0000 mg | ORAL_TABLET | Freq: Every day | ORAL | Status: DC
  Administered 2019-09-01 – 2019-09-03 (×3): 50 mg via ORAL
  Filled 2019-08-30 (×4): qty 1

## 2019-08-30 MED ORDER — CARVEDILOL 12.5 MG PO TABS
12.5000 mg | ORAL_TABLET | Freq: Two times a day (BID) | ORAL | Status: DC
  Administered 2019-08-31 – 2019-09-01 (×3): 12.5 mg via ORAL
  Filled 2019-08-30: qty 2
  Filled 2019-08-30 (×3): qty 1

## 2019-08-30 MED ORDER — ATORVASTATIN CALCIUM 20 MG PO TABS
20.0000 mg | ORAL_TABLET | Freq: Every day | ORAL | Status: DC
  Administered 2019-08-31 – 2019-09-04 (×5): 20 mg via ORAL
  Filled 2019-08-30 (×5): qty 1

## 2019-08-30 MED ORDER — SODIUM BICARBONATE 650 MG PO TABS
1300.0000 mg | ORAL_TABLET | Freq: Two times a day (BID) | ORAL | Status: DC
  Administered 2019-08-31 (×3): 1300 mg via ORAL
  Filled 2019-08-30 (×3): qty 2

## 2019-08-30 MED ORDER — CALCITRIOL 0.25 MCG PO CAPS
0.5000 ug | ORAL_CAPSULE | Freq: Every day | ORAL | Status: DC
  Filled 2019-08-30 (×2): qty 2

## 2019-08-30 MED ORDER — HYDROCODONE-ACETAMINOPHEN 5-325 MG PO TABS: 1.0000 | ORAL_TABLET | Freq: Four times a day (QID) | ORAL | Status: DC | PRN

## 2019-08-30 MED ORDER — VITAMIN D 25 MCG (1000 UNIT) PO TABS
5000.0000 [IU] | ORAL_TABLET | Freq: Every day | ORAL | Status: DC
  Administered 2019-08-31 – 2019-09-04 (×5): 5000 [IU] via ORAL
  Filled 2019-08-30 (×5): qty 5

## 2019-08-30 MED ORDER — LEVOTHYROXINE SODIUM 50 MCG PO TABS
25.0000 ug | ORAL_TABLET | Freq: Every day | ORAL | Status: DC
  Administered 2019-08-31 – 2019-09-04 (×5): 25 ug via ORAL
  Filled 2019-08-30 (×5): qty 1

## 2019-08-30 NOTE — ED Notes (Signed)
Unsuccessful IV attempt by this RN

## 2019-08-30 NOTE — ED Provider Notes (Signed)
[facility] Emergency Department Provider Note  ____________________________________________   First MD Initiated Contact with Patient 08/30/19 1956     (approximate)  I have reviewed the triage vital signs and the nursing notes.   HISTORY  Chief Complaint Fatigue and Weakness    HPI Tyler Hahn is a 79 y.o. male with EF of 20 to 25% ICD, CHF, diabetes, CKD who is following up with nephrology in regards to nearing dialysis.  Patient was recently discharge in March a few days ago.  Patient's kidney function was noted to be 6 when he is at baseline of 3.5.  Patient had a Foley placed and discharged with this.  Patient states that he is coming in with increasing fatigue and weakness.  Per pt he states that he has felt more fatigued for the past 3 weeks, constant, nothing makes it better, nothing makes it worse.  States that his son came over today and stated that he should come to the ER to be evaluated.  Patient has a fistula in his right arm but states he is not sure when they are planning to start dialysis.  He denies any chest pain.  Maybe a little bit of shortness of breath, no abdominal pain.  Patient still has a Foley in place and denies any urinary symptoms.  Per pt son, urine was darker then normal. He is very sleepy. He has to be woken up sleep then he will sit there and fall asleep again. He has decreased energy. No concern for SI.             Past Medical History:  Diagnosis Date  . Anxiety   . Automatic implantable cardioverter-defibrillator in situ    greg taylor  . CHF (congestive heart failure) ([place])    2000  . Diabetes mellitus    no meds  . Fatty liver   . Gout    "bout 2-3 months ago"-meds helped.  . Hyperlipidemia   . Hypertension   . Hyperthyroidism   . Kidney cysts   . PONV (postoperative nausea and vomiting)   . Renal insufficiency     Patient Active Problem List   Diagnosis Date Noted  . Acute renal failure (ARF)  ([place]) 08/18/2019  . History of anemia due to CKD 08/18/2019  . Hyperkalemia 08/17/2019  . Hypothyroid 08/16/2019  . Physical exam 02/18/2019  . Greater trochanteric bursitis of left hip 08/04/2018  . Osteoarthritis 09/25/2016  . Screen for colon cancer   . Benign neoplasm of transverse colon   . Edema 07/28/2014  . Elevated LFTs 07/28/2014  . Adjustment disorder with depressed mood 03/24/2014  . Epigastric pain 01/27/2014  . Small bowel mass 12/20/2013  . Abdominal pain, left upper quadrant 11/25/2013  . Myalgia 07/07/2013  . Loss of weight 04/19/2013  . Polyarthralgia 04/19/2013  . Loss of appetite 04/19/2013  . 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 Right elbow pain 08/17/2012  . 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 SYSTOLIC HEART FAILURE, CHRONIC 06/07/2010  . Chronic kidney disease (CKD), stage IV (severe) ([place]) 04/04/2010  . TESTICULAR MASS 04/04/2010  . DM (diabetes mellitus) type II controlled with renal manifestation ([place]) 03/07/2010  . Hyperlipidemia 03/07/2010  . GOUT 03/07/2010  . HYPERTENSION, BENIGN ESSENTIAL 03/07/2010  . IMPLANTABLE CARDIAC DEFIBRILLATOR -[employer]-SINGLE 03/07/2010    Past Surgical History:  Procedure Laterality Date  . AV FISTULA PLACEMENT Right 08/23/2019   Procedure: ARTERIOVENOUS GORTEX GRAFT RIGHT ARM;  Surgeon: Rosetta Posner, MD;  Location: [facility] OR;  Service: Vascular;  Laterality: Right;  . CARDIAC DEFIBRILLATOR PLACEMENT    . COLONOSCOPY WITH PROPOFOL N/A 10/03/2015   Procedure: COLONOSCOPY WITH PROPOFOL;  Surgeon: Ladene Artist, MD;  Location: [facility];  Service: Endoscopy;  Laterality: N/A;  . DIAGNOSTIC LAPAROSCOPY  01/27/2014   Dr Brantley Stage  . ENTEROSCOPY N/A 11/25/2013   Procedure: ENTEROSCOPY;  Surgeon: Ladene Artist, MD;  [facility];  Service: Endoscopy;  Laterality: N/A;  . ICD,[place] [employer]ific    . LAPAROSCOPY N/A 01/27/2014   Procedure: LAPAROSCOPY DIAGNOSTIC;  Surgeon: Joyice Faster. Cornett, MD;   Location: [facility];  Service: General;  Laterality: N/A;  . polyp removal throat     difficulty speaking    Prior to Admission medications   Medication Sig Start Date End Date Taking? Authorizing Provider  allopurinol (ZYLOPRIM) 100 MG tablet Take 1 tablet (100 mg total) by mouth daily. 04/16/19   [name], MD  atorvastatin (LIPITOR) 20 MG tablet Take 1 tablet (20 mg total) by mouth daily. 04/16/19   [name], MD  calcitRIOL (ROCALTROL) 0.5 MCG capsule Take 1 capsule (0.5 mcg total) by mouth daily. 04/16/19   [name], MD  carvedilol (COREG) 12.5 MG tablet Take 1 tablet (12.5 mg total) by mouth 2 (two) times daily. 04/16/19   [name], MD  Cholecalciferol (VITAMIN D3) 5000 units CAPS Take 1 capsule by mouth daily.    [provider]  HYDROcodone-acetaminophen (NORCO) 5-325 MG tablet Take 1 tablet by mouth every 6 (six) hours as needed for moderate pain. 08/23/19   Dagoberto Ligas, PA-C  levothyroxine (SYNTHROID) 25 MCG tablet Take 1 tablet (25 mcg total) by mouth daily before breakfast. 04/16/19   [name], MD  sertraline (ZOLOFT) 50 MG tablet Take 1 tablet (50 mg total) by mouth daily. 04/16/19   [name], MD  sodium bicarbonate 650 MG tablet Take 2 tablets (1,300 mg total) by mouth 2 (two) times daily. 08/24/19 09/23/19  Isaac Bliss, Rayford Halsted, MD  torsemide (DEMADEX) 20 MG tablet Take 1 tablet (20 mg total) by mouth daily. 04/16/19   [name], MD  atenolol (TENORMIN) 50 MG tablet Take 75 mg by mouth daily.    09/03/11  [provider]  Insulin Glargine (LANTUS SOLOSTAR [place]) Inject into the skin as directed.    09/03/11  [provider]    Allergies Sulfonamide derivatives  Family History  Problem Relation Age of Onset  . Stomach cancer Mother   . Alzheimer's disease Mother   . Diabetes Father   . Heart disease Father   . Liver disease Father   . Kidney disease Father     Social  History Social History   Tobacco Use  . Smoking status: Former Smoker    Types: Cigarettes    Quit date: 11/26/1998    Years since quitting: 20.7  . Smokeless tobacco: Never Used  Substance Use Topics  . Alcohol use: No    Alcohol/week: 0.0 standard drinks  . Drug use: No      Review of Systems Constitutional: No fever/chills positive increasing fatigue and weakness Eyes: No visual changes. ENT: No sore throat. Cardiovascular: Denies chest pain. Respiratory: Positive shortness of breath Gastrointestinal: No abdominal pain.  No nausea, no vomiting.  No diarrhea.  No constipation. Genitourinary: Negative for dysuria. Musculoskeletal: Negative for back pain. Skin: Negative for rash. Neurological: Negative for headaches, focal weakness or numbness. All other ROS negative ____________________________________________  PHYSICAL EXAM:  VITAL SIGNS: ED Triage Vitals  Enc Vitals Group     BP 08/30/19 1557 117/62     Pulse Rate 08/30/19 1557 79     Resp 08/30/19 1557 16     Temp 08/30/19 1557 97.9 F (36.6 C)     Temp Source 08/30/19 1557 Oral     SpO2 08/30/19 1557 99 %     Weight 08/30/19 1558 168 lb (76.2 kg)     Height 08/30/19 1558 5\' 7"  (1.702 m)     Head Circumference --      Peak Flow --      Pain Score 08/30/19 1557 0     Pain Loc --      Pain Edu? --      Excl. in [place]? --     Constitutional: Alert and oriented. Well appearing and in no acute distress. Eyes: Conjunctivae are normal. EOMI. Head: Atraumatic. Nose: No congestion/rhinnorhea. Mouth/Throat: Mucous membranes are moist.   Neck: No stridor. Trachea Midline. FROM Cardiovascular: Normal rate, regular rhythm. Grossly normal heart sounds.  Good peripheral circulation. Respiratory: Normal respiratory effort.  No retractions. Lungs CTAB. Gastrointestinal: Soft and nontender. No distention. No abdominal bruits.  Musculoskeletal: No lower extremity tenderness nor edema.  No joint effusions.  Right arm  fistula with pulsation felt Neurologic:  Normal speech and language. No gross focal neurologic deficits are appreciated.  Skin:  Skin is warm, dry and intact. No rash noted. Psychiatric: Mood and affect are normal. Speech and behavior are normal. GU: Deferred   ____________________________________________   LABS (all labs ordered are listed, but only abnormal results are displayed)  Labs Reviewed  BASIC METABOLIC PANEL - Abnormal; Notable for the following components:      Result Value   Potassium 5.2 (*)    CO2 17 (*)    Glucose, Bld 156 (*)    BUN 142 (*)    Creatinine, Ser 6.58 (*)    GFR calc non Af Amer 7 (*)    GFR calc Af Amer 9 (*)    Anion gap 17 (*)    All other components within normal limits  CBC - Abnormal; Notable for the following components:   WBC 18.5 (*)    RBC 3.35 (*)    Hemoglobin 9.3 (*)    HCT 28.6 (*)    Platelets 142 (*)    All other components within normal limits  TROPONIN I (HIGH SENSITIVITY) - Abnormal; Notable for the following components:   Troponin I (High Sensitivity) 42 (*)    All other components within normal limits  URINALYSIS, COMPLETE (UACMP) WITH MICROSCOPIC  TSH  T4, FREE   ____________________________________________   ED ECG REPORT I, Vanessa [place], the attending physician, personally viewed and interpreted this ECG.  EKG shows normal sinus rate of 82, no ST elevation, T wave inversions in 1 and aVL and V5 and V6 QTC of 502.  This T wave inversions look pretty similar ____________________________________________  RADIOLOGY Robert Bellow, personally viewed and evaluated these images (plain radiographs) as part of my medical decision making, as well as reviewing the written report by the radiologist.  ED MD interpretation:  No pna   Official radiology report(s): DG Chest 2 View  Result Date: 08/30/2019 CLINICAL DATA:  Shortness of breath EXAM: CHEST - 2 VIEW COMPARISON:  08/17/2018 FINDINGS: Left-sided pacing device as  before. No focal airspace disease or pleural effusion. Normal cardiomediastinal silhouette. No pneumothorax. IMPRESSION: No active cardiopulmonary disease. Electronically  Signed   By: Donavan Foil M.D.   On: 08/30/2019 20:32    ____________________________________________   PROCEDURES  Procedure(s) performed (including Critical Care):  Procedures   ____________________________________________   INITIAL IMPRESSION / ASSESSMENT AND PLAN / ED COURSE  Tyler Hahn was evaluated in Emergency Department on 08/30/2019 for the symptoms described in the history of present illness. He was evaluated in the context of the global COVID-19 pandemic, which necessitated consideration that the patient might be at risk for infection with the SARS-CoV-2 virus that causes COVID-19. Institutional protocols and algorithms that pertain to the evaluation of patients at risk for COVID-19 are in a state of rapid change based on information released by regulatory bodies including the CDC and federal and state organizations. These policies and algorithms were followed during the patient's care in the ED.    Patient is a 79 year old who comes in with increasing fatigue and weakness.  Will get labs to evaluate for worsening kidney disease, electrolyte abnormalities.  EKG and troponin ordered to evaluate for ACS.  Chest x-ray ordered evaluate for pneumonia, effusions.  Labs show significant BUN elevation.  I reviewed patient's prior BUN back in September his BUN was in the 40s.  His BUN now is in the 140s.  Suspect that this is causing his fatigue and weakness.  I do not think we can attribute this to depression from his wife's loss until we get this down and see if it helps his symptoms.  Patient lives alone.  Will discuss with nephrology to see if they be willing to start dialysis on this patient given his exceedingly high BUNs.   Discussed with nephrology Dr. Juleen China who agrees with admission for starting dialysis  due to his elevated BUN.  Cardiac marker also elevated but patient is CKD.  Will trend this out to see if stable.  We will discuss to the hospital team for admission         ____________________________________________   FINAL CLINICAL IMPRESSION(S) / ED DIAGNOSES   Final diagnoses:  Elevated BUN  ESRD (end stage renal disease) ([place])  Weakness      MEDICATIONS GIVEN DURING THIS VISIT:  Medications - No data to display   ED Discharge Orders    None       Note:  This document was prepared using Dragon voice recognition software and may include unintentional dictation errors.   Vanessa Toombs, MD 08/30/19 2048

## 2019-08-30 NOTE — ED Notes (Signed)
2 unsuccessful IV attempts by this RN (left medial AC and left forearm). Eugene Garnet, primary RN, made aware and order being placed for IV team consult at this time.

## 2019-08-30 NOTE — ED Triage Notes (Signed)
Pt in via POV, reports ongoing fatigue, weakness over the last few weaks.  Denies any pain.  Vitals WDL, NAD noted at this time.

## 2019-08-30 NOTE — H&P (Signed)
[place] [facility] [place] NAME: Tyler Hahn    MR#:  [id number]  DATE OF BIRTH:  28-Feb-1941  DATE OF ADMISSION:  08/30/2019  PRIMARY CARE PHYSICIAN: [name], MD   REQUESTING/REFERRING PHYSICIAN: Marjean Donna, MD.  CHIEF COMPLAINT:   Chief Complaint  Patient presents with  . Fatigue  . Weakness    HISTORY OF PRESENT ILLNESS:  Tyler Hahn  is a 79 y.o. male with a known history of hypertension, dyslipidemia, cardiomyopathy with EF of 20 to 25% status post AICD, type II obese mellitus hyperthyroidism, chronic kidney disease with history of recent admission to [facility] during which she had a right AV fistula placed and apparently had a plan for outpatient hemodialysis however he never had it since his discharge on 3/9.  He presents to the emergency room with acute onset of continued fatigue and generalized weakness with altered mental status with confusion.  Per the patient's son his urine has been darker than normal.  The patient has been much more somnolent with decreased energy.  He has a Foley catheter in place.  He denies any dysuria, hematuria or flank pain.  The patient's wife recently died.  He denied any suicidal ideation.  Upon presentation to the emergency room, blood pressure was 150/62 with otherwise normal vital signs.  Labs revealed potassium 5.2, BUN of 142 compared to 137 on 3/9 and creatinine of 6.58 compared to 6.26 then with blood glucose of 156.  High-sensitivity troponin I was 42.  CBC showed leukocytosis of 18.5 compared to a WBC of 6 on 3/9.  TSH was 4.1 with free T4 of 0.91.  Influenza antigens and COVID-19 PCR came back negative.  Portable chest ray showed no acute cardiopulmonary disease.  Contact was made with Dr. Juleen China  who is planning for hemodialysis tomorrow.  The patient will be admitted to observation medical monitored bed for further evaluation and management. PAST MEDICAL HISTORY:   Past Medical History:    Diagnosis Date  . Anxiety   . Automatic implantable cardioverter-defibrillator in situ    greg taylor  . CHF (congestive heart failure) ([place])    2000  . Diabetes mellitus    no meds  . Fatty liver   . Gout    "bout 2-3 months ago"-meds helped.  . Hyperlipidemia   . Hypertension   . Hyperthyroidism   . Kidney cysts   . PONV (postoperative nausea and vomiting)   . Renal insufficiency     PAST SURGICAL HISTORY:   Past Surgical History:  Procedure Laterality Date  . AV FISTULA PLACEMENT Right 08/23/2019   Procedure: ARTERIOVENOUS GORTEX GRAFT RIGHT ARM;  Surgeon: Rosetta Posner, MD;  Location: [facility];  Service: Vascular;  Laterality: Right;  . CARDIAC DEFIBRILLATOR PLACEMENT    . COLONOSCOPY WITH PROPOFOL N/A 10/03/2015   Procedure: COLONOSCOPY WITH PROPOFOL;  Surgeon: Ladene Artist, MD;  [facility];  Service: Endoscopy;  Laterality: N/A;  . DIAGNOSTIC LAPAROSCOPY  01/27/2014   Dr Brantley Stage  . ENTEROSCOPY N/A 11/25/2013   Procedure: ENTEROSCOPY;  Surgeon: Ladene Artist, MD;  [facility];  Service: Endoscopy;  Laterality: N/A;  . ICD,[place] Scentific    . LAPAROSCOPY N/A 01/27/2014   Procedure: LAPAROSCOPY DIAGNOSTIC;  Surgeon: Joyice Faster. Cornett, MD;  Location: [facility];  Service: General;  Laterality: N/A;  . polyp removal throat     difficulty speaking    SOCIAL HISTORY:   Social History   Tobacco Use  .  Smoking status: Former Smoker    Types: Cigarettes    Quit date: 11/26/1998    Years since quitting: 20.7  . Smokeless tobacco: Never Used  Substance Use Topics  . Alcohol use: No    Alcohol/week: 0.0 standard drinks    FAMILY HISTORY:   Family History  Problem Relation Age of Onset  . Stomach cancer Mother   . Alzheimer's disease Mother   . Diabetes Father   . Heart disease Father   . Liver disease Father   . Kidney disease Father     DRUG ALLERGIES:   Allergies  Allergen Reactions  . Sulfonamide Derivatives Itching and Rash     REVIEW OF SYSTEMS:   ROS As per history of present illness. All pertinent systems were reviewed above. Constitutional,  HEENT, cardiovascular, respiratory, GI, GU, musculoskeletal, neuro, psychiatric, endocrine,  integumentary and hematologic systems were reviewed and are otherwise  negative/unremarkable except for positive findings mentioned above in the HPI.   MEDICATIONS AT HOME:   Prior to Admission medications   Medication Sig Start Date End Date Taking? Authorizing Provider  allopurinol (ZYLOPRIM) 100 MG tablet Take 1 tablet (100 mg total) by mouth daily. 04/16/19   [name], MD  atorvastatin (LIPITOR) 20 MG tablet Take 1 tablet (20 mg total) by mouth daily. 04/16/19   [name], MD  calcitRIOL (ROCALTROL) 0.5 MCG capsule Take 1 capsule (0.5 mcg total) by mouth daily. 04/16/19   [name], MD  carvedilol (COREG) 12.5 MG tablet Take 1 tablet (12.5 mg total) by mouth 2 (two) times daily. 04/16/19   [name], MD  Cholecalciferol (VITAMIN D3) 5000 units CAPS Take 1 capsule by mouth daily.    [provider]  HYDROcodone-acetaminophen (NORCO) 5-325 MG tablet Take 1 tablet by mouth every 6 (six) hours as needed for moderate pain. 08/23/19   Dagoberto Ligas, PA-C  levothyroxine (SYNTHROID) 25 MCG tablet Take 1 tablet (25 mcg total) by mouth daily before breakfast. 04/16/19   [name], MD  sertraline (ZOLOFT) 50 MG tablet Take 1 tablet (50 mg total) by mouth daily. 04/16/19   [name], MD  sodium bicarbonate 650 MG tablet Take 2 tablets (1,300 mg total) by mouth 2 (two) times daily. 08/24/19 09/23/19  Isaac Bliss, Rayford Halsted, MD  torsemide (DEMADEX) 20 MG tablet Take 1 tablet (20 mg total) by mouth daily. 04/16/19   [name], MD  atenolol (TENORMIN) 50 MG tablet Take 75 mg by mouth daily.    09/03/11  [provider]  Insulin Glargine (LANTUS SOLOSTAR [place]) Inject into the skin as directed.    09/03/11   [provider]      VITAL SIGNS:  Blood pressure 117/62, pulse 79, temperature 97.9 F (36.6 C), temperature source Oral, resp. rate 16, height 5\' 7"  (1.702 m), weight 76.2 kg, SpO2 99 %.  PHYSICAL EXAMINATION:  Physical Exam  GENERAL:  79 y.o.-year-old African-American male patient lying in the bed with no acute distress was mildly lethargic and looks chronically ill. EYES: Pupils equal, round, reactive to light and accommodation. No scleral icterus. Extraocular muscles intact.  HEENT: Head atraumatic, normocephalic. Oropharynx and nasopharynx clear.  NECK:  Supple, no jugular venous distention. No thyroid enlargement, no tenderness.  LUNGS: Normal breath sounds bilaterally, no wheezing, rales,rhonchi or crepitation. No use of accessory muscles of respiration.  CARDIOVASCULAR: Regular rate and rhythm, S1, S2 normal. No murmurs, rubs, or gallops.  ABDOMEN: Soft, nondistended, nontender. Bowel sounds present.  No organomegaly or mass.  EXTREMITIES: No pedal edema, cyanosis, or clubbing.  NEUROLOGIC: Cranial nerves II through XII are intact. Muscle strength 5/5 in all extremities. Sensation intact. Gait not checked.  PSYCHIATRIC: Normal affect and good eye contact. SKIN: No obvious rash, lesion, or ulcer.   LABORATORY PANEL:   CBC Recent Labs  Lab 08/30/19 1602  WBC 18.5*  HGB 9.3*  HCT 28.6*  PLT 142*   ------------------------------------------------------------------------------------------------------------------  Chemistries  Recent Labs  Lab 08/30/19 1602  NA 138  K 5.2*  CL 104  CO2 17*  GLUCOSE 156*  BUN 142*  CREATININE 6.58*  CALCIUM 9.4   ------------------------------------------------------------------------------------------------------------------  Cardiac Enzymes No results for input(s): TROPONINI in the last 168  hours. ------------------------------------------------------------------------------------------------------------------  RADIOLOGY:  DG Chest 2 View  Result Date: 08/30/2019 CLINICAL DATA:  Shortness of breath EXAM: CHEST - 2 VIEW COMPARISON:  08/17/2018 FINDINGS: Left-sided pacing device as before. No focal airspace disease or pleural effusion. Normal cardiomediastinal silhouette. No pneumothorax. IMPRESSION: No active cardiopulmonary disease. Electronically Signed   By: Donavan Foil M.D.   On: 08/30/2019 20:32      IMPRESSION AND PLAN:   1.  Acute kidney injury in the setting of end-stage renal disease requiring hemodialysis with subsequent acute metabolic encephalopathy. -The patient will be admitted to an observation medical monitored bed. -A nephrology consultation will be obtained.  The patient will undergo hemodialysis in a.m. -Dr. Juleen China was notified about the patient. -Nephrotoxins will be held off. -Calcitriol  will be resumed.  2.  Mild hyperkalemia. -The patient will be given 15 g of p.o. Kayexalate.  3.  Hypothyroidism. -Synthroid will be continued.  4.  Hypertension. -Norvasc and Coreg will be resumed. -Zestril will be held off.  5.  Depression. -Zoloft will be resumed.  6.  Dyslipidemia. -Statin therapy will be resumed.  7.  DVT prophylaxis. -Subcutaneous heparin.  All the records are reviewed and case discussed with ED provider. The plan of care was discussed in details with the patient (and family). I answered all questions. The patient agreed to proceed with the above mentioned plan. Further management will depend upon hospital course.   CODE STATUS: Full code  TOTAL TIME TAKING CARE OF THIS PATIENT: 55 minutes.    Christel Mormon M.D on 08/30/2019 at 9:32 PM  [facility]   From 7 PM-7 AM, contact night-coverage [email or web address]  CC: Primary care physician; [name], MD   Note: This dictation was prepared with Dragon dictation  along with smaller phrase technology. Any transcriptional errors that result from this process are unintentional.

## 2019-08-31 ENCOUNTER — Inpatient Hospital Stay: Payer: Medicare HMO | Admitting: Family Medicine

## 2019-08-31 DIAGNOSIS — A498 Other bacterial infections of unspecified site: Secondary | ICD-10-CM | POA: Diagnosis not present

## 2019-08-31 DIAGNOSIS — Z800 Family history of malignant neoplasm of digestive organs: Secondary | ICD-10-CM | POA: Diagnosis not present

## 2019-08-31 DIAGNOSIS — B965 Pseudomonas (aeruginosa) (mallei) (pseudomallei) as the cause of diseases classified elsewhere: Secondary | ICD-10-CM | POA: Diagnosis not present

## 2019-08-31 DIAGNOSIS — N390 Urinary tract infection, site not specified: Secondary | ICD-10-CM

## 2019-08-31 DIAGNOSIS — R531 Weakness: Secondary | ICD-10-CM

## 2019-08-31 DIAGNOSIS — K760 Fatty (change of) liver, not elsewhere classified: Secondary | ICD-10-CM | POA: Diagnosis present

## 2019-08-31 DIAGNOSIS — A4152 Sepsis due to Pseudomonas: Secondary | ICD-10-CM | POA: Diagnosis not present

## 2019-08-31 DIAGNOSIS — T83511S Infection and inflammatory reaction due to indwelling urethral catheter, sequela: Secondary | ICD-10-CM | POA: Diagnosis not present

## 2019-08-31 DIAGNOSIS — M1990 Unspecified osteoarthritis, unspecified site: Secondary | ICD-10-CM | POA: Diagnosis present

## 2019-08-31 DIAGNOSIS — I10 Essential (primary) hypertension: Secondary | ICD-10-CM | POA: Diagnosis not present

## 2019-08-31 DIAGNOSIS — R7881 Bacteremia: Secondary | ICD-10-CM | POA: Diagnosis not present

## 2019-08-31 DIAGNOSIS — I5022 Chronic systolic (congestive) heart failure: Secondary | ICD-10-CM | POA: Diagnosis not present

## 2019-08-31 DIAGNOSIS — G9341 Metabolic encephalopathy: Secondary | ICD-10-CM | POA: Diagnosis not present

## 2019-08-31 DIAGNOSIS — Z794 Long term (current) use of insulin: Secondary | ICD-10-CM | POA: Diagnosis not present

## 2019-08-31 DIAGNOSIS — I132 Hypertensive heart and chronic kidney disease with heart failure and with stage 5 chronic kidney disease, or end stage renal disease: Secondary | ICD-10-CM | POA: Diagnosis not present

## 2019-08-31 DIAGNOSIS — N179 Acute kidney failure, unspecified: Secondary | ICD-10-CM | POA: Diagnosis present

## 2019-08-31 DIAGNOSIS — N186 End stage renal disease: Secondary | ICD-10-CM | POA: Diagnosis not present

## 2019-08-31 DIAGNOSIS — Z95810 Presence of automatic (implantable) cardiac defibrillator: Secondary | ICD-10-CM | POA: Diagnosis not present

## 2019-08-31 DIAGNOSIS — Z820 Family history of epilepsy and other diseases of the nervous system: Secondary | ICD-10-CM | POA: Diagnosis not present

## 2019-08-31 DIAGNOSIS — D631 Anemia in chronic kidney disease: Secondary | ICD-10-CM | POA: Diagnosis not present

## 2019-08-31 DIAGNOSIS — Z20822 Contact with and (suspected) exposure to covid-19: Secondary | ICD-10-CM | POA: Diagnosis not present

## 2019-08-31 DIAGNOSIS — I429 Cardiomyopathy, unspecified: Secondary | ICD-10-CM | POA: Diagnosis not present

## 2019-08-31 DIAGNOSIS — Y846 Urinary catheterization as the cause of abnormal reaction of the patient, or of later complication, without mention of misadventure at the time of the procedure: Secondary | ICD-10-CM | POA: Diagnosis not present

## 2019-08-31 DIAGNOSIS — E785 Hyperlipidemia, unspecified: Secondary | ICD-10-CM | POA: Diagnosis present

## 2019-08-31 DIAGNOSIS — E872 Acidosis: Secondary | ICD-10-CM | POA: Diagnosis not present

## 2019-08-31 DIAGNOSIS — I120 Hypertensive chronic kidney disease with stage 5 chronic kidney disease or end stage renal disease: Secondary | ICD-10-CM | POA: Diagnosis not present

## 2019-08-31 DIAGNOSIS — R799 Abnormal finding of blood chemistry, unspecified: Secondary | ICD-10-CM

## 2019-08-31 DIAGNOSIS — M109 Gout, unspecified: Secondary | ICD-10-CM | POA: Diagnosis present

## 2019-08-31 DIAGNOSIS — Z833 Family history of diabetes mellitus: Secondary | ICD-10-CM | POA: Diagnosis not present

## 2019-08-31 DIAGNOSIS — T83511A Infection and inflammatory reaction due to indwelling urethral catheter, initial encounter: Secondary | ICD-10-CM | POA: Diagnosis not present

## 2019-08-31 DIAGNOSIS — Z79890 Hormone replacement therapy (postmenopausal): Secondary | ICD-10-CM | POA: Diagnosis not present

## 2019-08-31 DIAGNOSIS — E039 Hypothyroidism, unspecified: Secondary | ICD-10-CM | POA: Diagnosis not present

## 2019-08-31 DIAGNOSIS — N2581 Secondary hyperparathyroidism of renal origin: Secondary | ICD-10-CM | POA: Diagnosis not present

## 2019-08-31 DIAGNOSIS — T83511D Infection and inflammatory reaction due to indwelling urethral catheter, subsequent encounter: Secondary | ICD-10-CM | POA: Diagnosis not present

## 2019-08-31 DIAGNOSIS — E1122 Type 2 diabetes mellitus with diabetic chronic kidney disease: Secondary | ICD-10-CM | POA: Diagnosis not present

## 2019-08-31 LAB — CBC
HCT: 24.1 % — ABNORMAL LOW (ref 39.0–52.0)
Hemoglobin: 8.1 g/dL — ABNORMAL LOW (ref 13.0–17.0)
MCH: 27.6 pg (ref 26.0–34.0)
MCHC: 33.6 g/dL (ref 30.0–36.0)
MCV: 82 fL (ref 80.0–100.0)
Platelets: 142 10*3/uL — ABNORMAL LOW (ref 150–400)
RBC: 2.94 MIL/uL — ABNORMAL LOW (ref 4.22–5.81)
RDW: 14.2 % (ref 11.5–15.5)
WBC: 15.7 10*3/uL — ABNORMAL HIGH (ref 4.0–10.5)
nRBC: 0 % (ref 0.0–0.2)

## 2019-08-31 LAB — HEPATITIS B SURFACE ANTIGEN: Hepatitis B Surface Ag: NONREACTIVE

## 2019-08-31 LAB — GLUCOSE, CAPILLARY
Glucose-Capillary: 98 mg/dL (ref 70–99)
Glucose-Capillary: 101 mg/dL — ABNORMAL HIGH (ref 70–99)

## 2019-08-31 LAB — HEPATITIS B SURFACE ANTIBODY,QUALITATIVE: Hep B S Ab: NONREACTIVE

## 2019-08-31 LAB — IRON AND TIBC
Iron: 17 ug/dL — ABNORMAL LOW (ref 45–182)
Saturation Ratios: 9 % — ABNORMAL LOW (ref 17.9–39.5)
TIBC: 196 ug/dL — ABNORMAL LOW (ref 250–450)
UIBC: 179 ug/dL

## 2019-08-31 LAB — HEPATITIS B CORE ANTIBODY, TOTAL: Hep B Core Total Ab: NONREACTIVE

## 2019-08-31 LAB — FERRITIN: Ferritin: 316 ng/mL (ref 24–336)

## 2019-08-31 LAB — HEPATITIS C ANTIBODY: HCV Ab: NONREACTIVE

## 2019-08-31 MED ORDER — SODIUM CHLORIDE 0.9 % IV SOLN
1.0000 g | INTRAVENOUS | Status: DC
  Administered 2019-08-31: 1 g via INTRAVENOUS
  Filled 2019-08-31: qty 1
  Filled 2019-08-31: qty 10

## 2019-08-31 MED ORDER — INSULIN ASPART 100 UNIT/ML ~~LOC~~ SOLN
0.0000 [IU] | Freq: Three times a day (TID) | SUBCUTANEOUS | Status: DC
  Filled 2019-08-31: qty 1

## 2019-08-31 MED ORDER — CHLORHEXIDINE GLUCONATE CLOTH 2 % EX PADS
6.0000 | MEDICATED_PAD | Freq: Every day | CUTANEOUS | Status: DC
  Administered 2019-08-31 – 2019-09-04 (×5): 6 via TOPICAL

## 2019-08-31 MED ORDER — INSULIN ASPART 100 UNIT/ML ~~LOC~~ SOLN: 0.0000 [IU] | Freq: Every day | SUBCUTANEOUS | Status: DC

## 2019-08-31 MED ORDER — ACETAMINOPHEN 325 MG PO TABS
650.0000 mg | ORAL_TABLET | Freq: Four times a day (QID) | ORAL | Status: DC | PRN
  Administered 2019-08-31: 650 mg via ORAL
  Filled 2019-08-31: qty 2

## 2019-08-31 NOTE — Progress Notes (Signed)
Hd completed 

## 2019-08-31 NOTE — Progress Notes (Signed)
Acknowledged; New order for Tylenol. Barbaraann Faster, RN5:54 AM 08/31/2019

## 2019-08-31 NOTE — Progress Notes (Signed)
Pharmacy Antibiotic Note  Tyler Hahn is a 79 y.o. male admitted on 08/30/2019 with UTI.  Pharmacy has been consulted for Ceftriaxone dosing.  Plan: Will order Ceftriaxone 1 gram IV q24h -f/u urine cx, blood cx   Height: 5\' 7"  (170.2 cm) Weight: 156 lb 15.5 oz (71.2 kg) IBW/kg (Calculated) : 66.1  Temp (24hrs), Avg:98.9 F (37.2 C), Min:97.9 F (36.6 C), Max:100.6 F (38.1 C)  Recent Labs  Lab 08/30/19 1602  WBC 18.5*  CREATININE 6.58*    Estimated Creatinine Clearance: 8.8 mL/min (A) (by C-G formula based on SCr of 6.58 mg/dL (H)).    Allergies  Allergen Reactions  . Sulfonamide Derivatives Itching and Rash    Antimicrobials this admission: CTX 3/16 >>        >>    Dose adjustments this admission:    Microbiology results: 3/15 BCx: pend 3/15 UCx: pend    Sputum:      MRSA PCR:    Thank you for allowing pharmacy to be a part of this patient's care.  Tyler Hahn A 08/31/2019 8:11 AM

## 2019-08-31 NOTE — Progress Notes (Signed)
Rachael Fee, NP notified of elevated Temp 100.6 with no PRN medications, via secure chat.  Seen. No new orders. Barbaraann Faster, RN 5:48 AM 08/31/2019

## 2019-08-31 NOTE — Consult Note (Signed)
[facility]  CONSULT NOTE    Date: 08/31/2019                  Patient Name:  Tyler Hahn  MRN: [id number]  DOB: [birth year]  Age / Sex: 79 y.o., male         PCP: [name], MD                 Service Requesting Consult: Dr. Sidney Ace                 Reason for Consult: End Stage Renal Disease            History of Present Illness: Tyler Hahn is known to our practice as his wife was followed by our practice for end stage renal disease. She recently passed.   Patient was recently admitted to [facility] from 3/2 to 3/9 for end stage renal disease with uremic symptoms. AVG was placed during that admission. He was to have follow up with Nephrology, presumably [facility], however he was too sick to go and instead was brought by family to [facility] ED. He was admitted to start him on hemodialysis.   Patient is tired and sleepy. He is not eating well. He endorses feeling weak, tired, and unable to eat. Son, [name], who is on the phone, states patient is hard to arouse and having bouts of confusion.   Patient states he is interested in starting dialysis.    Medications: Outpatient medications: Medications Prior to Admission  Medication Sig Dispense Refill Last Dose  . allopurinol (ZYLOPRIM) 100 MG tablet Take 1 tablet (100 mg total) by mouth daily. 90 tablet 1 Unknown at Unknown  . amLODipine (NORVASC) 5 MG tablet Take 5 mg by mouth daily.   Unknown at Unknown  . atorvastatin (LIPITOR) 20 MG tablet Take 1 tablet (20 mg total) by mouth daily. 90 tablet 1 Unknown at Unknown  . calcitRIOL (ROCALTROL) 0.5 MCG capsule Take 1 capsule (0.5 mcg total) by mouth daily. 90 capsule 1 Unknown at Unknown  . carvedilol (COREG) 12.5 MG tablet Take 1 tablet (12.5 mg total) by mouth 2 (two) times daily. 180 tablet 1 Unknown at Unknown  . Cholecalciferol (VITAMIN D3) 5000 units CAPS Take 1 capsule by mouth daily.   Unknown at Unknown  . levothyroxine  (SYNTHROID) 25 MCG tablet Take 1 tablet (25 mcg total) by mouth daily before breakfast. 90 tablet 1 Unknown at Unknown  . lisinopril (ZESTRIL) 20 MG tablet Take 20 mg by mouth daily.   Unknown at Unknown  . sertraline (ZOLOFT) 50 MG tablet Take 1 tablet (50 mg total) by mouth daily. 90 tablet 1 Unknown at Unknown  . sodium bicarbonate 650 MG tablet Take 2 tablets (1,300 mg total) by mouth 2 (two) times daily. 120 tablet 0 Unknown at Unknown  . torsemide (DEMADEX) 20 MG tablet Take 1 tablet (20 mg total) by mouth daily. 90 tablet 1 Unknown at Unknown  . HYDROcodone-acetaminophen (NORCO) 5-325 MG tablet Take 1 tablet by mouth every 6 (six) hours as needed for moderate pain. (Patient not taking: Reported on 08/30/2019) 10 tablet 0 Not Taking at Unknown time    Current medications: Current Facility-Administered Medications  Medication Dose Route Frequency Provider Last Rate Last Admin  . acetaminophen (TYLENOL) tablet 650 mg  650 mg Oral Q6H PRN Sharion Settler, NP   650 mg at 08/31/19 9485  . allopurinol (ZYLOPRIM) tablet 100 mg  100 mg  Oral Daily Mansy, Jan A, MD      . atorvastatin (LIPITOR) tablet 20 mg  20 mg Oral Daily Mansy, Jan A, MD      . calcitRIOL (ROCALTROL) capsule 0.5 mcg  0.5 mcg Oral Daily Mansy, Jan A, MD      . carvedilol (COREG) tablet 12.5 mg  12.5 mg Oral BID Mansy, Jan A, MD   12.5 mg at 08/31/19 0012  . cefTRIAXone (ROCEPHIN) 1 g in sodium chloride 0.9 % 100 mL IVPB  1 g Intravenous Q24H Lorella Nimrod, MD 200 mL/hr at 08/31/19 0904 1 g at 08/31/19 0904  . Chlorhexidine Gluconate Cloth 2 % PADS 6 each  6 each Topical Daily Mansy, Arvella Merles, MD   6 each at 08/31/19 0830  . cholecalciferol (VITAMIN D3) tablet 5,000 Units  5,000 Units Oral Daily Mansy, Jan A, MD      . heparin injection 5,000 Units  5,000 Units Subcutaneous Q8H Mansy, Arvella Merles, MD   5,000 Units at 08/31/19 0608  . HYDROcodone-acetaminophen (NORCO/VICODIN) 5-325 MG per tablet 1 tablet  1 tablet Oral Q6H PRN Mansy, Jan A,  MD      . levothyroxine (SYNTHROID) tablet 25 mcg  25 mcg Oral QAC breakfast Mansy, Jan A, MD   25 mcg at 08/31/19 0608  . sertraline (ZOLOFT) tablet 50 mg  50 mg Oral Daily Mansy, Jan A, MD      . sodium bicarbonate tablet 1,300 mg  1,300 mg Oral BID Mansy, Jan A, MD   1,300 mg at 08/31/19 0012      Allergies: Allergies  Allergen Reactions  . Sulfonamide Derivatives Itching and Rash      Past Medical History: Past Medical History:  Diagnosis Date  . Anxiety   . Automatic implantable cardioverter-defibrillator in situ    greg taylor  . CHF (congestive heart failure) ([place])    2000  . Diabetes mellitus    no meds  . Fatty liver   . Gout    "bout 2-3 months ago"-meds helped.  . Hyperlipidemia   . Hypertension   . Hyperthyroidism   . Kidney cysts   . PONV (postoperative nausea and vomiting)   . Renal insufficiency      Past Surgical History: Past Surgical History:  Procedure Laterality Date  . AV FISTULA PLACEMENT Right 08/23/2019   Procedure: ARTERIOVENOUS GORTEX GRAFT RIGHT ARM;  Surgeon: Rosetta Posner, MD;  Location: [facility];  Service: Vascular;  Laterality: Right;  . CARDIAC DEFIBRILLATOR PLACEMENT    . COLONOSCOPY WITH PROPOFOL N/A 10/03/2015   Procedure: COLONOSCOPY WITH PROPOFOL;  Surgeon: Ladene Artist, MD;  Location: [facility];  Service: Endoscopy;  Laterality: N/A;  . DIAGNOSTIC LAPAROSCOPY  01/27/2014   Dr Brantley Stage  . ENTEROSCOPY N/A 11/25/2013   Procedure: ENTEROSCOPY;  Surgeon: Ladene Artist, MD;  Location: [facility];  Service: Endoscopy;  Laterality: N/A;  . ICD,[place] Scentific    . LAPAROSCOPY N/A 01/27/2014   Procedure: LAPAROSCOPY DIAGNOSTIC;  Surgeon: Joyice Faster. Cornett, MD;  Location: [facility];  Service: General;  Laterality: N/A;  . polyp removal throat     difficulty speaking     Family History: Family History  Problem Relation Age of Onset  . Stomach cancer Mother   . Alzheimer's disease Mother   . Diabetes Father   . Heart disease Father    . Liver disease Father   . Kidney disease Father      Social History: Social History   Socioeconomic History  .  Marital status: Married    Spouse name: Not on file  . Number of children: 4  . Years of education: Not on file  . Highest education level: Not on file  Occupational History  . Occupation: Retired    Fish farm manager: RETIRED  Tobacco Use  . Smoking status: Former Smoker    Types: Cigarettes    Quit date: 11/26/1998    Years since quitting: 20.7  . Smokeless tobacco: Never Used  Substance and Sexual Activity  . Alcohol use: No    Alcohol/week: 0.0 standard drinks  . Drug use: No  . Sexual activity: Not on file  Other Topics Concern  . Not on file  Social History Narrative  . Not on file   Social Determinants of Health   Financial Resource Strain:   . Difficulty of Paying Living Expenses:   Food Insecurity:   . Worried About Charity fundraiser in the Last Year:   . Arboriculturist in the Last Year:   Transportation Needs:   . Film/video editor (Medical):   Marland Kitchen Lack of Transportation (Non-Medical):   Physical Activity:   . Days of Exercise per Week:   . Minutes of Exercise per Session:   Stress:   . Feeling of Stress :   Social Connections:   . Frequency of Communication with Friends and Family:   . Frequency of Social Gatherings with Friends and Family:   . Attends Religious Services:   . Active Member of Clubs or Organizations:   . Attends Archivist Meetings:   Marland Kitchen Marital Status:   Intimate Partner Violence:   . Fear of Current or Ex-Partner:   . Emotionally Abused:   Marland Kitchen Physically Abused:   . Sexually Abused:      Review of Systems: Review of Systems  Constitutional: Positive for malaise/fatigue and weight loss. Negative for chills, diaphoresis and fever.  HENT: Positive for hearing loss. Negative for congestion, ear discharge, ear pain, nosebleeds, sinus pain, sore throat and tinnitus.   Eyes: Negative.  Negative for blurred vision,  double vision, photophobia, pain, discharge and redness.  Respiratory: Positive for shortness of breath. Negative for cough, hemoptysis, sputum production, wheezing and stridor.   Cardiovascular: Negative.  Negative for chest pain, palpitations, orthopnea, claudication, leg swelling and PND.  Gastrointestinal: Positive for nausea. Negative for abdominal pain, blood in stool, constipation, diarrhea, heartburn, melena and vomiting.       Poor appetite  Genitourinary: Negative.  Negative for dysuria, flank pain, frequency, hematuria and urgency.  Musculoskeletal: Negative.  Negative for back pain, falls, joint pain, myalgias and neck pain.  Skin: Negative.  Negative for itching and rash.  Neurological: Positive for weakness. Negative for dizziness, tingling, tremors, sensory change, speech change, focal weakness, seizures, loss of consciousness and headaches.  Endo/Heme/Allergies: Negative.  Negative for environmental allergies and polydipsia. Does not bruise/bleed easily.  Psychiatric/Behavioral: Positive for depression. Negative for hallucinations, memory loss, substance abuse and suicidal ideas. The patient is nervous/anxious. The patient does not have insomnia.     Vital Signs: Blood pressure (!) 109/49, pulse 76, temperature 98.6 F (37 C), temperature source Oral, resp. rate 13, height 5\' 7"  (1.702 m), weight 72 kg, SpO2 99 %.  Weight trends: Filed Weights   08/30/19 1558 08/30/19 2248 08/31/19 0940  Weight: 76.2 kg 71.2 kg 72 kg    Physical Exam: General: NAD, slow to respond.   Head: Normocephalic, atraumatic. Moist oral mucosal membranes  Eyes: Anicteric, PERRL  Neck: Supple,  trachea midline  Lungs:  Clear to auscultation  Heart: Regular rate and rhythm  Abdomen:  Soft, nontender,   Extremities:  no peripheral edema.  Neurologic: Nonfocal, moving all four extremities  Skin: No lesions  Access: Right AVG placed on 3/8 Dr. Donnetta Hutching     Lab results: Basic Metabolic  Panel: Recent Labs  Lab 08/30/19 1602  NA 138  K 5.2*  CL 104  CO2 17*  GLUCOSE 156*  BUN 142*  CREATININE 6.58*  CALCIUM 9.4    Liver Function Tests: No results for input(s): AST, ALT, ALKPHOS, BILITOT, PROT, ALBUMIN in the last 168 hours. No results for input(s): LIPASE, AMYLASE in the last 168 hours. No results for input(s): AMMONIA in the last 168 hours.  CBC: Recent Labs  Lab 08/30/19 1602 08/31/19 0919  WBC 18.5* 15.7*  HGB 9.3* 8.1*  HCT 28.6* 24.1*  MCV 85.4 82.0  PLT 142* 142*    Cardiac Enzymes: No results for input(s): CKTOTAL, CKMB, CKMBINDEX, TROPONINI in the last 168 hours.  BNP: Invalid input(s): POCBNP  CBG: No results for input(s): GLUCAP in the last 168 hours.  Microbiology: Results for orders placed or performed during the hospital encounter of 08/30/19  Respiratory Panel by RT PCR (Flu A&B, Covid) - Nasopharyngeal Swab     Status: None   Collection Time: 08/30/19  8:41 PM   Specimen: Nasopharyngeal Swab  Result Value Ref Range Status   SARS Coronavirus 2 by RT PCR NEGATIVE NEGATIVE Final    Comment: (NOTE) SARS-CoV-2 target nucleic acids are NOT DETECTED. The SARS-CoV-2 RNA is generally detectable in upper respiratoy specimens during the acute phase of infection. The lowest concentration of SARS-CoV-2 viral copies this assay can detect is 131 copies/mL. A negative result does not preclude SARS-Cov-2 infection and should not be used as the sole basis for treatment or other patient management decisions. A negative result may occur with  improper specimen collection/handling, submission of specimen other than nasopharyngeal swab, presence of viral mutation(s) within the areas targeted by this assay, and inadequate number of viral copies (<131 copies/mL). A negative result must be combined with clinical observations, patient history, and epidemiological information. The expected result is Negative. Fact Sheet for Patients:   PinkCheek.be Fact Sheet for Healthcare Providers:  GravelBags.it This test is not yet ap proved or cleared by the [place] FDA and  has been authorized for detection and/or diagnosis of SARS-CoV-2 by FDA under an Emergency Use Authorization (EUA). This EUA will remain  in effect (meaning this test can be used) for the duration of the COVID-19 declaration under Section 564(b)(1) of the Act, 21 U.S.C. section 360bbb-3(b)(1), unless the authorization is terminated or revoked sooner.    Influenza A by PCR NEGATIVE NEGATIVE Final   Influenza B by PCR NEGATIVE NEGATIVE Final    Comment: (NOTE) The Xpert Xpress SARS-CoV-2/FLU/RSV assay is intended as an aid in  the diagnosis of influenza from Nasopharyngeal swab specimens and  should not be used as a sole basis for treatment. Nasal washings and  aspirates are unacceptable for Xpert Xpress SARS-CoV-2/FLU/RSV  testing. Fact Sheet for Patients: PinkCheek.be Fact Sheet for Healthcare Providers: GravelBags.it This test is not yet approved or cleared by the [place] FDA and  has been authorized for detection and/or diagnosis of SARS-CoV-2 by  FDA under an Emergency Use Authorization (EUA). This EUA will remain  in effect (meaning this test can be used) for the duration of the  Covid-19 declaration under Section 564(b)(1) of the  Act, 21  U.S.C. section 360bbb-3(b)(1), unless the authorization is  terminated or revoked. Performed at [facility], [address] [place], [place] [address]     Coagulation Studies: No results for input(s): LABPROT, INR in the last 72 hours.  Urinalysis: Recent Labs    08/30/19 2325  COLORURINE YELLOW*  LABSPEC 1.011  PHURINE 5.0  GLUCOSEU NEGATIVE  HGBUR LARGE*  BILIRUBINUR NEGATIVE  KETONESUR NEGATIVE  PROTEINUR 30*  NITRITE POSITIVE*  LEUKOCYTESUR LARGE*       Imaging: DG Chest 2 View  Result Date: 08/30/2019 CLINICAL DATA:  Shortness of breath EXAM: CHEST - 2 VIEW COMPARISON:  08/17/2018 FINDINGS: Left-sided pacing device as before. No focal airspace disease or pleural effusion. Normal cardiomediastinal silhouette. No pneumothorax. IMPRESSION: No active cardiopulmonary disease. Electronically Signed   By: Donavan Foil M.D.   On: 08/30/2019 20:32      Assessment & Plan: Mr. SAKARI ALKHATIB is a 79 y.o. black male with hypertension, diabetes mellitus type II, hypothyroidism, hyperlipidemia, congestive heart failure, AICD,  who was admitted to [facility] on 08/30/2019 for Weakness [R53.1] Elevated BUN [R79.9] ESRD (end stage renal disease) ([place]) [N18.6]  1. End stage renal disease: not on dialysis.  With hyperkalemia, metabolic acidosis, uremia, hyperphosphatemia, anemia of renal failure.  AVG is mature. Will initiate dialysis this admission.  Patient will need three consecutive days of dialysis and outpatient placement.  - Continue sodium bicarbonate.   2. Hypertension: 109/49. Holding home blood pressure agents of torsemide, lisinopril and amlodipine.  - Continues on carvedilol.   3. 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  Discussed ESA and IV iron with patient. Have not initiated.   4. Secondary Hyperparathyroidism: no new PTH. Not currently on binders.  - calcitriol.   5. Diabetes mellitus type II with chronic kidney disease: hemoglobin A1c of 6.8% on 3/1. Well controlled.    LOS: 0 Tyler Hahn 3/16/202111:49 AM

## 2019-08-31 NOTE — Evaluation (Signed)
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 Per physician notes patient did not receive dialysis since his d/c on 3/9. Presented to ER with fatigue and weakness with AMS.  Clinical Impression  Patient is a pleasant 79 year old male who presents with weakness and limited stability with mobility. Patient was in bed upon PT arrival and was agreeable to PT session despite his fatigue. Patient transitioned EOB with additional time needed and use of UE's however did not require assistance from PT. Upon sitting he maintained his COM with reaching and LE movement. Initial sit to stand was challenging to patient requiring close CGA and RW due to fatigue and unsteadiness on feet however later transfers were performed with improved coordination and stability. Bathroom negotiation was performed with patient demonstrating safe mobility with walker up to toilet, and backwards stepping as well safely.  Attempt at urination was unsuccessful ( nursing notified) patient fatigued with short ambulation within room and was returned to bed after performing standing marches. Patient will benefit from skilled physical therapy while in the hospital to increase strength, capacity for mobility, and balance. Upon discharge patient will benefit from home health physical therapy and intermittent aide to address above mentioned impairments and decrease fall risk.     Follow Up Recommendations Home health PT;Supervision - Intermittent    Equipment Recommendations  None recommended by PT     Recommendations for Other Services OT consult     Precautions / Restrictions Precautions Precautions: Fall Restrictions Weight Bearing Restrictions: No      Mobility  Bed Mobility Overal bed mobility: Modified Independent Bed Mobility: Supine to Sit;Sit to Supine     Supine to sit: Modified independent (Device/Increase time);Supervision Sit to supine: Modified independent (Device/Increase time);Supervision   General bed mobility comments: Patient transitions from supine <>sitting EOB with use of UE's and additional time.  Transfers Overall transfer level: Needs assistance Equipment used: Rolling walker (2 wheeled) Transfers: Sit to/from Stand Sit to Stand: Supervision;Min guard         General transfer comment: CGA for STS, initial sit to stand unsteady due to first time standing in a long time. improved stability with static stand.  Ambulation/Gait Ambulation/Gait assistance: Min guard Gait Distance (Feet): 45 Feet Assistive device: Rolling walker (2 wheeled) Gait Pattern/deviations: Step-through pattern;Decreased stride length;Shuffle;Trunk flexed Gait velocity: reduced   General Gait Details: Patient requires cueing to keep feet inside walker when turning/negotiating obstacles. Able to bring walker up to toilet and attempt urination as well as back up without LOB.  Stairs            Wheelchair Mobility    Modified Rankin (Stroke Patients Only)       Balance Overall balance assessment: Needs assistance Sitting-balance support: No upper extremity supported;Feet supported Sitting balance-Leahy Scale: Good Sitting balance - Comments: reaches inside/outside BOS without LOB   Standing balance support: Bilateral upper extremity supported;During functional activity Standing balance-Leahy Scale: Fair Standing balance comment: requires use of RW for ambulation and dynamic mobility  Pertinent Vitals/Pain Pain  Assessment: No/denies pain    Home Living Family/patient expects to be discharged to:: Private residence Living Arrangements: Alone(has two sons who live close by) Available Help at Discharge: Family;Other (Comment)(someone comes in and cleans 3x/week) Type of Home: Apartment Home Access: Ramped entrance     Home Layout: One level Home Equipment: Walker - 2 wheels;Cane - single point;Shower seat;Grab bars - tub/shower;Grab bars - toilet;Electric scooter Additional Comments: Patient reports he has a power chair for when his gout is bad, but normally uses his cane and walker. Has been living alone since his wife passed away this year.    Prior Function Level of Independence: Independent with assistive device(s);Needs assistance   Gait / Transfers Assistance Needed: patient reports he primarily uses walker and cane at home but does use a power chair when his gout acts up.  ADL's / Homemaking Assistance Needed: has someone come in and clean 3x/week. Son does his grocery shopping        Hand Dominance   Dominant Hand: Right    Extremity/Trunk Assessment   Upper Extremity Assessment Upper Extremity Assessment: Generalized weakness;Defer to OT evaluation    Lower Extremity Assessment Lower Extremity Assessment: RLE deficits/detail;LLE deficits/detail RLE Deficits / Details: grossly 3+/5 with abduction 3/5 RLE Sensation: decreased light touch RLE Coordination: WNL LLE Deficits / Details: grossly 3/5 LLE Sensation: decreased light touch LLE Coordination: decreased gross motor       Communication   Communication: No difficulties  Cognition Arousal/Alertness: Awake/alert Behavior During Therapy: WFL for tasks assessed/performed Overall Cognitive Status: Within Functional Limits for tasks assessed                                 General Comments: Patient is very pleasant. Able to articulate his emotions well, requires slight additional time for command  follow/response however is very sleepy/fatigued.      General Comments General comments (skin integrity, edema, etc.): slight swelling in L knee.    Exercises Other Exercises Other Exercises: patient educated on safe mobility and transfers with RW, proper placement of self within walker and safety awareness. Standing marches with RW x12 each LE.Bathroom negotiation performed with patient demonstrating safe negotiation w/ CGA   Assessment/Plan    PT Assessment Patient needs continued PT services  PT Problem List Decreased strength;Decreased mobility;Decreased activity tolerance;Decreased balance;Decreased knowledge of use of DME       PT Treatment Interventions DME instruction;Therapeutic activities;Gait training;Therapeutic exercise;Patient/family education;Balance training;Functional mobility training;Neuromuscular re-education;Manual techniques    PT Goals (Current goals can be found in the Care Plan section)  Acute Rehab PT Goals Patient Stated Goal: to get my strength back and go home PT Goal Formulation: With patient Time For Goal Achievement: 09/14/19 Potential to Achieve Goals: Fair    Frequency Min 2X/week   Barriers to discharge   HHPT and aide    Co-evaluation               AM-PAC PT "6 Clicks" Mobility  Outcome Measure Help needed turning from your back to your side while in a flat bed without using bedrails?: None Help needed moving from lying on your back to sitting on the side of a flat bed without using bedrails?: None Help needed moving to and from a bed to a chair (including a wheelchair)?: A Little Help needed standing up from a chair using your arms (e.g., wheelchair or bedside chair)?: A Little Help needed  to walk in hospital room?: A Little Help needed climbing 3-5 steps with a railing? : A Lot 6 Click Score: 19    End of Session Equipment Utilized During Treatment: Gait belt Activity Tolerance: Patient tolerated treatment well Patient left:  with call bell/phone within reach;in bed;with bed alarm set Nurse Communication: Mobility status;Other (comment)(patient unable to urinate) PT Visit Diagnosis: Unsteadiness on feet (R26.81);Muscle weakness (generalized) (M62.81);Other abnormalities of gait and mobility (R26.89);Difficulty in walking, not elsewhere classified (R26.2)    Time: 8250-5397 PT Time Calculation (min) (ACUTE ONLY): 24 min   Charges:   PT Evaluation $PT Eval Low Complexity: 1 Low PT Treatments $Therapeutic Activity: 8-22 mins        Janna Arch, PT, DPT   08/31/2019, 5:25 PM

## 2019-08-31 NOTE — Progress Notes (Signed)
PROGRESS NOTE    Tyler Hahn  WRU:[id number] DOB: 11/05/1940 DOA: 08/30/2019 PCP: [name], MD   Brief Narrative:  Tyler Hahn  is a 79 y.o. 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  He presents to the emergency room with acute onset of continued fatigue and generalized weakness with altered mental status with confusion.  Found to have mildly elevated potassium at 5.2, leukocytosis.  Developed fever of 100.6 overnight.  UA looks infected, cultures sent and patient was started on ceftriaxone.  Subjective: Patient was feeling little better when seen during dialysis today.  He was still feeling weak.  No other complaints.  Denies any urinary symptoms.  He continues to make urine.  Assessment & Plan:   Active Problems:   ESRD (end stage renal disease) ([place])  UTI.  UA was positive for nitrites, leukocytes and bacteria.  Urine culture ordered and he was started on ceftriaxone which can be deescalated according to culture and sensitivity result.  He is not having any urinary symptoms but become febrile and having leukocytosis.  Altered mental status can be due to UTI.  Also getting blood cultures.  Patient has a Foley placed on 08/20/2019 during prior hospitalization at [facility] and discharged home with Foley.  According to son there was some concern about urinary retention, he also mentioned that he was able to urinate well prior to this Foley placement.  No recommendations for urology follow-up. -I will discontinue Foley catheter and give him a voiding trial.  If he is unable to void or having significant retention he will need replacement of Foley and a  urology consult.  -Follow-up blood and urine cultures. -Monitor fever curve.  ESRD with no prior dialysis.  Patient was recently admitted at [facility] with AKI on CKD.  AVF was placed and he was instructed to go to [facility] to start dialysis.  Patient was becoming more weak and did not initiate dialysis yet. Currently having mild hyperkalemia, metabolic acidosis, uremia and hyperphosphatemia.  Nephrology was consulted and he was initiated on dialysis. -Received his first session of dialysis today.  Generalized weakness.  UTI and uremia both can be contributory.  Patient is new for dialysis. -Get PT/OT evaluation.  Type 2 diabetes.  Well-controlled with A1c of 6.8 on 08/16/2019. Not on any home meds at this time. -SSI.  Hypothyroidism. -Continue home dose of Synthroid.  Hypertension.  Pressure within goal. -Continue home dose of Norvasc and Coreg. -Zestril was held on admission.  Dyslipidemia. -Continue statin  Depression. -Continue home dose of Zoloft.  Objective: Vitals:   08/31/19 1115 08/31/19 1130 08/31/19 1145 08/31/19 1228  BP: (!) 121/45 (!) 130/41 (!) 109/49 (!) 117/45  Pulse: 72 74 76 73  Resp: 16 16 13 16   Temp:   98.6 F (37 C) 98.9 F (37.2 C)  TempSrc:   Oral Oral  SpO2:    100%  Weight:      Height:        Intake/Output Summary (Last 24 hours) at 08/31/2019 1239 Last data filed at 08/31/2019 1145 Gross per 24 hour  Intake 50 ml  Output 550 ml  Net -500 ml   [employer]  08/30/19 1558 08/30/19 2248 08/31/19 0940  Weight: 76.2 kg 71.2 kg 72 kg    Examination:  General exam: Appears calm and comfortable, getting his dialysis. Respiratory system: Clear to auscultation. Respiratory effort normal. Cardiovascular system: S1 & S2 heard, RRR. No JVD, murmurs, rubs, gallops or clicks. Gastrointestinal system: Soft, nontender, nondistended, bowel sounds positive. Central nervous system: Alert and oriented. No focal neurological  deficits.Symmetric 5 x 5 power. Extremities: No edema, no cyanosis, pulses intact and symmetrical. Skin: No rashes, lesions or ulcers Psychiatry: Judgement and insight appear normal. Mood & affect appropriate.    DVT prophylaxis: Heparin Code Status: Full Family Communication: Son was updated on phone. Disposition Plan: Pending improvement and started on scheduled dialysis.  Need 3 consecutive days of inpatient dialysis before starting outpatient placement.  Consultants:   Nephrology  Procedures:  Antimicrobials: Ceftriaxone  Data Reviewed: I have personally reviewed following labs and imaging studies  CBC: Recent Labs  Lab 08/30/19 1602 08/31/19 0919  WBC 18.5* 15.7*  HGB 9.3* 8.1*  HCT 28.6* 24.1*  MCV 85.4 82.0  PLT 142* 161*   Basic Metabolic Panel: Recent Labs  Lab 08/30/19 1602  NA 138  K 5.2*  CL 104  CO2 17*  GLUCOSE 156*  BUN 142*  CREATININE 6.58*  CALCIUM 9.4   GFR: Estimated Creatinine Clearance: 8.8 mL/min (A) (by C-G formula based on SCr of 6.58 mg/dL (H)). Liver Function Tests: No results for input(s): AST, ALT, ALKPHOS, BILITOT, PROT, ALBUMIN in the last 168 hours. No results for input(s): LIPASE, AMYLASE in the last 168 hours. No results for input(s): AMMONIA in the last 168 hours. Coagulation Profile: No results for input(s): INR, PROTIME in the last 168 hours. Cardiac Enzymes: No results for input(s): CKTOTAL, CKMB, CKMBINDEX, TROPONINI in the last 168 hours. BNP (last 3 results) No results for input(s): PROBNP in the last 8760 hours. HbA1C: No results for input(s): HGBA1C in the last 72 hours. CBG: No results for input(s): GLUCAP in the last 168 hours. Lipid Profile: No results for input(s): CHOL, HDL, LDLCALC, TRIG, CHOLHDL, LDLDIRECT in the last 72 hours. Thyroid Function Tests: Recent Labs    08/30/19 1602  TSH 4.183  FREET4 0.91   Anemia Panel: Recent Labs    08/31/19 0919  FERRITIN 316  TIBC 196*  IRON 17*   Sepsis  Labs: No results for input(s): PROCALCITON, LATICACIDVEN in the last 168 hours.  Recent Results (from the past 240 hour(s))  Surgical pcr screen     Status: None   Collection Time: 08/22/19  9:54 PM   Specimen: Nasal Mucosa; Nasal Swab  Result Value Ref Range Status   MRSA, PCR NEGATIVE NEGATIVE Final   Staphylococcus aureus NEGATIVE NEGATIVE Final    Comment: (NOTE) The Xpert SA Assay (FDA approved for NASAL specimens in patients 40 years of age and older), is one component of a comprehensive surveillance program. It is not intended to diagnose infection nor to guide or monitor treatment. Performed at [facility], [place] [address] [place], [place] [address]   Respiratory Panel by RT PCR (Flu A&B, Covid) - Nasopharyngeal Swab     Status: None   Collection Time: 08/30/19  8:41 PM   Specimen: Nasopharyngeal Swab  Result Value Ref Range Status   SARS Coronavirus 2 by RT PCR NEGATIVE NEGATIVE Final    Comment: (NOTE) SARS-CoV-2 target nucleic acids are NOT DETECTED. The SARS-CoV-2 RNA is generally detectable in upper respiratoy specimens during the acute phase of infection. The  lowest concentration of SARS-CoV-2 viral copies this assay can detect is 131 copies/mL. A negative result does not preclude SARS-Cov-2 infection and should not be used as the sole basis for treatment or other patient management decisions. A negative result may occur with  improper specimen collection/handling, submission of specimen other than nasopharyngeal swab, presence of viral mutation(s) within the areas targeted by this assay, and inadequate number of viral copies (<131 copies/mL). A negative result must be combined with clinical observations, patient history, and epidemiological information. The expected result is Negative. Fact Sheet for Patients:  PinkCheek.be Fact Sheet for Healthcare Providers:  GravelBags.it This test is not yet ap  proved or cleared by the [place] FDA and  has been authorized for detection and/or diagnosis of SARS-CoV-2 by FDA under an Emergency Use Authorization (EUA). This EUA will remain  in effect (meaning this test can be used) for the duration of the COVID-19 declaration under Section 564(b)(1) of the Act, 21 U.S.C. section 360bbb-3(b)(1), unless the authorization is terminated or revoked sooner.    Influenza A by PCR NEGATIVE NEGATIVE Final   Influenza B by PCR NEGATIVE NEGATIVE Final    Comment: (NOTE) The Xpert Xpress SARS-CoV-2/FLU/RSV assay is intended as an aid in  the diagnosis of influenza from Nasopharyngeal swab specimens and  should not be used as a sole basis for treatment. Nasal washings and  aspirates are unacceptable for Xpert Xpress SARS-CoV-2/FLU/RSV  testing. Fact Sheet for Patients: PinkCheek.be Fact Sheet for Healthcare Providers: GravelBags.it This test is not yet approved or cleared by the [place] FDA and  has been authorized for detection and/or diagnosis of SARS-CoV-2 by  FDA under an Emergency Use Authorization (EUA). This EUA will remain  in effect (meaning this test can be used) for the duration of the  Covid-19 declaration under Section 564(b)(1) of the Act, 21  U.S.C. section 360bbb-3(b)(1), unless the authorization is  terminated or revoked. Performed at [facility], [address], [place], [place] [address]      Radiology Studies: DG Chest 2 View  Result Date: 08/30/2019 CLINICAL DATA:  Shortness of breath EXAM: CHEST - 2 VIEW COMPARISON:  08/17/2018 FINDINGS: Left-sided pacing device as before. No focal airspace disease or pleural effusion. Normal cardiomediastinal silhouette. No pneumothorax. IMPRESSION: No active cardiopulmonary disease. Electronically Signed   By: Donavan Foil M.D.   On: 08/30/2019 20:32    Scheduled Meds: . allopurinol  100 mg Oral Daily  .  atorvastatin  20 mg Oral Daily  . calcitRIOL  0.5 mcg Oral Daily  . carvedilol  12.5 mg Oral BID  . Chlorhexidine Gluconate Cloth  6 each Topical Daily  . cholecalciferol  5,000 Units Oral Daily  . heparin  5,000 Units Subcutaneous Q8H  . insulin aspart  0-5 Units Subcutaneous QHS  . insulin aspart  0-6 Units Subcutaneous TID WC  . levothyroxine  25 mcg Oral QAC breakfast  . sertraline  50 mg Oral Daily  . sodium bicarbonate  1,300 mg Oral BID   Continuous Infusions: . cefTRIAXone (ROCEPHIN)  IV 1 g (08/31/19 0904)     LOS: 0 days   Time spent: 45 minutes.  Lorella Nimrod, MD [facility]  If 7PM-7AM, please contact night-coverage [email or web address]  08/31/2019, 12:39 PM   This record has been created using Systems analyst. Errors have been sought and corrected,but may not always be located. Such creation errors do not reflect on the standard of care.

## 2019-08-31 NOTE — Progress Notes (Signed)
Initial Nutrition Assessment  DOCUMENTATION CODES:   Not applicable  INTERVENTION:   Nepro Shake po BID, each supplement provides 425 kcal and 19 grams protein  Rena-vite daily   NUTRITION DIAGNOSIS:   Increased nutrient needs related to chronic illness(ESRD on HD) as evidenced by increased estimated needs.  GOAL:   Patient will meet greater than or equal to 90% of their needs  MONITOR:   Labs, Weight trends, Skin, I & O's, PO intake, Supplement acceptance  REASON FOR ASSESSMENT:   Malnutrition Screening Tool    ASSESSMENT:   79 y.o. 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 Suspect pt with poor appetite and oral intake pta. Per chart, pt has lost 7lbs(4%) over the past 2 weeks; this is significant. Pt currently NPO. RD will add supplements and vitamins once diet advanced to replace losses from HD. RD will obtain nutrition related history and exam at follow up. RD will also provide new HD/renal diet education at that time.   Medications reviewed and include: allopurinol, calcitriol, heparin, synthroid, Na bicarbonate, ceftriaxone   Labs reviewed: K 5.2(H), BUN 142(H), creat 6.58(H) Iron 17(L), TIBC 196(L), ferritin 316 Wbc- 15.7(H), Hgb 8.1(L), Hct 24.1(L)  Unable to complete Nutrition-Focused physical exam at this time.   Diet Order:   Diet Order            Diet NPO time specified  Diet effective now             EDUCATION NEEDS:   Not appropriate for education at this time  Skin:  Skin Assessment: Reviewed RN Assessment  Last BM:  3/15  Height:   Ht  Readings from Last 1 Encounters:  08/30/19 5\' 7"  (1.702 m)    Weight:   Wt Readings from Last 1 Encounters:  08/31/19 72 kg    Ideal Body Weight:  67.2 kg  BMI:  Body mass index is 24.86 kg/m.  Estimated Nutritional Needs:   Kcal:  1700-2000kcal/day  Protein:  85-100g/day  Fluid:  UOP +1L  Koleen Distance MS, RD, LDN Contact information available in Amion

## 2019-08-31 NOTE — Progress Notes (Signed)
Hd started  

## 2019-08-31 NOTE — Progress Notes (Signed)
Pre dialysis assessment 

## 2019-08-31 NOTE — Progress Notes (Signed)
RN Elita Quick remove foley catheter at 1400 per MD order. Pt has not voided yet. Pt was bladder scan at 1800 and he only had 180 ml of urine in his bladder. Per MD pt has until 2200 to void. If pt is not able to void by 2200 okay for RN to in and out pt. RN will continue to assess and monitor pt.

## 2019-09-01 DIAGNOSIS — A498 Other bacterial infections of unspecified site: Secondary | ICD-10-CM

## 2019-09-01 DIAGNOSIS — E1122 Type 2 diabetes mellitus with diabetic chronic kidney disease: Secondary | ICD-10-CM

## 2019-09-01 DIAGNOSIS — E039 Hypothyroidism, unspecified: Secondary | ICD-10-CM

## 2019-09-01 DIAGNOSIS — I10 Essential (primary) hypertension: Secondary | ICD-10-CM

## 2019-09-01 DIAGNOSIS — N390 Urinary tract infection, site not specified: Secondary | ICD-10-CM

## 2019-09-01 DIAGNOSIS — T83511D Infection and inflammatory reaction due to indwelling urethral catheter, subsequent encounter: Secondary | ICD-10-CM

## 2019-09-01 DIAGNOSIS — E785 Hyperlipidemia, unspecified: Secondary | ICD-10-CM

## 2019-09-01 DIAGNOSIS — T83511A Infection and inflammatory reaction due to indwelling urethral catheter, initial encounter: Secondary | ICD-10-CM

## 2019-09-01 LAB — RENAL FUNCTION PANEL
Albumin: 3.1 g/dL — ABNORMAL LOW (ref 3.5–5.0)
Anion gap: 13 (ref 5–15)
BUN: 98 mg/dL — ABNORMAL HIGH (ref 8–23)
CO2: 25 mmol/L (ref 22–32)
Calcium: 8.6 mg/dL — ABNORMAL LOW (ref 8.9–10.3)
Chloride: 106 mmol/L (ref 98–111)
Creatinine, Ser: 4.76 mg/dL — ABNORMAL HIGH (ref 0.61–1.24)
GFR calc Af Amer: 13 mL/min — ABNORMAL LOW (ref >60)
GFR calc non Af Amer: 11 mL/min — ABNORMAL LOW (ref >60)
Glucose, Bld: 82 mg/dL (ref 70–99)
Phosphorus: 5 mg/dL — ABNORMAL HIGH (ref 2.5–4.6)
Potassium: 4.3 mmol/L (ref 3.5–5.1)
Sodium: 144 mmol/L (ref 135–145)

## 2019-09-01 LAB — CBC
HCT: 23.7 % — ABNORMAL LOW (ref 39.0–52.0)
Hemoglobin: 7.8 g/dL — ABNORMAL LOW (ref 13.0–17.0)
MCH: 27.6 pg (ref 26.0–34.0)
MCHC: 32.9 g/dL (ref 30.0–36.0)
MCV: 83.7 fL (ref 80.0–100.0)
Platelets: 141 10*3/uL — ABNORMAL LOW (ref 150–400)
RBC: 2.83 MIL/uL — ABNORMAL LOW (ref 4.22–5.81)
RDW: 14.2 % (ref 11.5–15.5)
WBC: 13.6 10*3/uL — ABNORMAL HIGH (ref 4.0–10.5)
nRBC: 0 % (ref 0.0–0.2)

## 2019-09-01 LAB — PARATHYROID HORMONE, INTACT (NO CA): PTH: 233 pg/mL — ABNORMAL HIGH (ref 15–65)

## 2019-09-01 LAB — GLUCOSE, CAPILLARY
Glucose-Capillary: 54 mg/dL — ABNORMAL LOW (ref 70–99)
Glucose-Capillary: 78 mg/dL (ref 70–99)
Glucose-Capillary: 92 mg/dL (ref 70–99)

## 2019-09-01 MED ORDER — LEVOFLOXACIN IN D5W 750 MG/150ML IV SOLN
750.0000 mg | Freq: Once | INTRAVENOUS | Status: AC
  Administered 2019-09-01: 750 mg via INTRAVENOUS
  Filled 2019-09-01: qty 150

## 2019-09-01 MED ORDER — NEPRO/CARBSTEADY PO LIQD
237.0000 mL | Freq: Two times a day (BID) | ORAL | Status: DC
  Administered 2019-09-01 – 2019-09-03 (×3): 237 mL via ORAL

## 2019-09-01 MED ORDER — LEVOFLOXACIN IN D5W 500 MG/100ML IV SOLN
500.0000 mg | INTRAVENOUS | Status: DC
  Filled 2019-09-01: qty 100

## 2019-09-01 MED ORDER — CIPROFLOXACIN IN D5W 400 MG/200ML IV SOLN
400.0000 mg | INTRAVENOUS | Status: DC
  Administered 2019-09-01: 400 mg via INTRAVENOUS
  Filled 2019-09-01: qty 200

## 2019-09-01 MED ORDER — FINASTERIDE 5 MG PO TABS
5.0000 mg | ORAL_TABLET | Freq: Every day | ORAL | Status: DC
  Administered 2019-09-01 – 2019-09-04 (×4): 5 mg via ORAL
  Filled 2019-09-01 (×4): qty 1

## 2019-09-01 MED ORDER — SODIUM CHLORIDE 0.9 % IV SOLN
INTRAVENOUS | Status: DC | PRN
  Administered 2019-09-01: 250 mL via INTRAVENOUS

## 2019-09-01 MED ORDER — RENA-VITE PO TABS
1.0000 | ORAL_TABLET | Freq: Every day | ORAL | Status: DC
  Administered 2019-09-01 – 2019-09-03 (×3): 1 via ORAL
  Filled 2019-09-01 (×4): qty 1

## 2019-09-01 NOTE — Progress Notes (Signed)
PT Cancellation Note  Patient Details Name: Tyler Hahn MRN: [id number] DOB: 09-04-1940   Cancelled Treatment:    Reason Eval/Treat Not Completed: Patient at procedure or test/unavailable.  Pt currently off floor at dialysis.  Will re-attempt PT treatment session at a later date/time.  Leitha Bleak, PT 09/01/19, 8:54 AM

## 2019-09-01 NOTE — Progress Notes (Signed)
[facility]  ROUNDING NOTE   Subjective:   Seen and examined on hemodialysis treatment. Tolerating treatment well. Today is second treatment. First dialysis treatment was yesterday. Tolerated well.    HEMODIALYSIS FLOWSHEET:  Blood Flow Rate (mL/min): 200 mL/min Arterial Pressure (mmHg): -110 mmHg Venous Pressure (mmHg): 80 mmHg Transmembrane Pressure (mmHg): 80 mmHg Ultrafiltration Rate (mL/min): 400 mL/min Dialysate Flow Rate (mL/min): 500 ml/min Conductivity: Machine : 14 Conductivity: Machine : 14 Dialysis Fluid Bolus: Normal Saline Bolus Amount (mL): 250 mL    Objective:  Vital signs in last 24 hours:  Temp:  [98.8 F (37.1 C)-99.2 F (37.3 C)] 98.9 F (37.2 C) (03/17 1141) Pulse Rate:  [62-100] 69 (03/17 1141) Resp:  [14-21] 16 (03/17 1141) BP: (107-181)/(42-86) 108/45 (03/17 1141) SpO2:  [93 %-100 %] 93 % (03/17 1141)  Weight change: -4.204 kg Filed Weights   08/30/19 1558 08/30/19 2248 08/31/19 0940  Weight: 76.2 kg 71.2 kg 72 kg    Intake/Output: I/O last 3 completed shifts: In: 304.3 [P.O.:290; IV Piggyback:14.3] Out: 700 [Urine:700]   Intake/Output this shift:  Total I/O In: -  Out: 500 [Other:500]  Physical Exam: General: NAD,   Head: Normocephalic, atraumatic. Moist oral mucosal membranes  Eyes: Anicteric, PERRL  Neck: Supple, trachea midline  Lungs:  Clear to auscultation  Heart: Regular rate and rhythm  Abdomen:  Soft, nontender,   Extremities: no peripheral edema.  Neurologic: Nonfocal, moving all four extremities  Skin: No lesions  Access: Right AVG    Basic Metabolic Panel: Recent Labs  Lab 08/30/19 1602 09/01/19 0330  NA 138 144  K 5.2* 4.3  CL 104 106  CO2 17* 25  GLUCOSE 156* 82  BUN 142* 98*  CREATININE 6.58* 4.76*  CALCIUM 9.4 8.6*  PHOS  --  5.0*    Liver Function Tests: Recent Labs  Lab 09/01/19 0330  ALBUMIN 3.1*   No results for input(s): LIPASE, AMYLASE in the last 168 hours. No results for  input(s): AMMONIA in the last 168 hours.  CBC: Recent Labs  Lab 08/30/19 1602 08/31/19 0919 09/01/19 0330  WBC 18.5* 15.7* 13.6*  HGB 9.3* 8.1* 7.8*  HCT 28.6* 24.1* 23.7*  MCV 85.4 82.0 83.7  PLT 142* 142* 141*    Cardiac Enzymes: No results for input(s): CKTOTAL, CKMB, CKMBINDEX, TROPONINI in the last 168 hours.  BNP: Invalid input(s): POCBNP  CBG: Recent Labs  Lab 08/31/19 1627 08/31/19 2133  GLUCAP 98 101*    Microbiology: Results for orders placed or performed during the hospital encounter of 08/30/19  Respiratory Panel by RT PCR (Flu A&B, Covid) - Nasopharyngeal Swab     Status: None   Collection Time: 08/30/19  8:41 PM   Specimen: Nasopharyngeal Swab  Result Value Ref Range Status   SARS Coronavirus 2 by RT PCR NEGATIVE NEGATIVE Final    Comment: (NOTE) SARS-CoV-2 target nucleic acids are NOT DETECTED. The SARS-CoV-2 RNA is generally detectable in upper respiratoy specimens during the acute phase of infection. The lowest concentration of SARS-CoV-2 viral copies this assay can detect is 131 copies/mL. A negative result does not preclude SARS-Cov-2 infection and should not be used as the sole basis for treatment or other patient management decisions. A negative result may occur with  improper specimen collection/handling, submission of specimen other than nasopharyngeal swab, presence of viral mutation(s) within the areas targeted by this assay, and inadequate number of viral copies (<131 copies/mL). A negative result must be combined with clinical observations, patient history, and epidemiological  information. The expected result is Negative. Fact Sheet for Patients:  PinkCheek.be Fact Sheet for Healthcare Providers:  GravelBags.it This test is not yet ap proved or cleared by the [place] FDA and  has been authorized for detection and/or diagnosis of SARS-CoV-2 by FDA under an Emergency Use  Authorization (EUA). This EUA will remain  in effect (meaning this test can be used) for the duration of the COVID-19 declaration under Section 564(b)(1) of the Act, 21 U.S.C. section 360bbb-3(b)(1), unless the authorization is terminated or revoked sooner.    Influenza A by PCR NEGATIVE NEGATIVE Final   Influenza B by PCR NEGATIVE NEGATIVE Final    Comment: (NOTE) The Xpert Xpress SARS-CoV-2/FLU/RSV assay is intended as an aid in  the diagnosis of influenza from Nasopharyngeal swab specimens and  should not be used as a sole basis for treatment. Nasal washings and  aspirates are unacceptable for Xpert Xpress SARS-CoV-2/FLU/RSV  testing. Fact Sheet for Patients: PinkCheek.be Fact Sheet for Healthcare Providers: GravelBags.it This test is not yet approved or cleared by the [place] FDA and  has been authorized for detection and/or diagnosis of SARS-CoV-2 by  FDA under an Emergency Use Authorization (EUA). This EUA will remain  in effect (meaning this test can be used) for the duration of the  Covid-19 declaration under Section 564(b)(1) of the Act, 21  U.S.C. section 360bbb-3(b)(1), unless the authorization is  terminated or revoked. Performed at [facility], [address], [place], [place] [address]   Urine Culture     Status: Abnormal (Preliminary result)   Collection Time: 08/30/19 11:25 PM   Specimen: Urine, Catheterized  Result Value Ref Range Status   Specimen Description   Final    URINE, CATHETERIZED Performed at [facility], [address], [place], [place] [address]    Special Requests   Final    NONE Performed at [facility], [address] [place], [place] [address]    Culture >=100,000 COLONIES/mL PSEUDOMONAS AERUGINOSA (A)  Final   Report Status PENDING  Incomplete  CULTURE, BLOOD (ROUTINE X 2) w Reflex to ID Panel     Status: None (Preliminary result)   Collection  Time: 08/31/19  8:08 AM   Specimen: BLOOD  Result Value Ref Range Status   Specimen Description BLOOD LEFT AC  Final   Special Requests   Final    BOTTLES DRAWN AEROBIC AND ANAEROBIC Blood Culture results may not be optimal due to an excessive volume of blood received in culture bottles   Culture   Final    NO GROWTH 1 DAY Performed at [facility], [address] [place], [place] [address]    Report Status PENDING  Incomplete  CULTURE, BLOOD (ROUTINE X 2) w Reflex to ID Panel     Status: None (Preliminary result)   Collection Time: 08/31/19  8:13 AM   Specimen: BLOOD  Result Value Ref Range Status   Specimen Description BLOOD LEFT HAND  Final   Special Requests   Final    BOTTLES DRAWN AEROBIC AND ANAEROBIC Blood Culture adequate volume   Culture   Final    NO GROWTH 1 DAY Performed at [facility], [address], [place], [place] [address]    Report Status PENDING  Incomplete    Coagulation Studies: No results for input(s): LABPROT, INR in the last 72 hours.  Urinalysis: Recent Labs    08/30/19 2325  COLORURINE YELLOW*  LABSPEC 1.011  PHURINE 5.0  GLUCOSEU NEGATIVE  HGBUR LARGE*  BILIRUBINUR NEGATIVE  KETONESUR NEGATIVE  PROTEINUR 30*  NITRITE POSITIVE*  LEUKOCYTESUR LARGE*      Imaging: DG Chest 2 View  Result Date: 08/30/2019 CLINICAL DATA:  Shortness of breath EXAM: CHEST - 2 VIEW COMPARISON:  08/17/2018 FINDINGS: Left-sided pacing device as before. No focal airspace disease or pleural effusion. Normal cardiomediastinal silhouette. No pneumothorax. IMPRESSION: No active cardiopulmonary disease. Electronically Signed   By: Donavan Foil M.D.   On: 08/30/2019 20:32     Medications:   . ciprofloxacin     . allopurinol  100 mg Oral Daily  . atorvastatin  20 mg Oral Daily  . calcitRIOL  0.5 mcg Oral Daily  . carvedilol  12.5 mg Oral BID  . Chlorhexidine Gluconate Cloth  6 each Topical Daily  . cholecalciferol  5,000 Units Oral Daily   . feeding supplement (NEPRO CARB STEADY)  237 mL Oral BID BM  . finasteride  5 mg Oral Daily  . heparin  5,000 Units Subcutaneous Q8H  . insulin aspart  0-5 Units Subcutaneous QHS  . insulin aspart  0-6 Units Subcutaneous TID WC  . levothyroxine  25 mcg Oral QAC breakfast  . multivitamin  1 tablet Oral QHS  . sertraline  50 mg Oral Daily  . sodium bicarbonate  1,300 mg Oral BID   acetaminophen, HYDROcodone-acetaminophen  Assessment/ Plan:  [name] is a 79 y.o. black male with hypertension, diabetes mellitus type II, hypothyroidism, hyperlipidemia, congestive heart failure, AICD,  who was admitted to [facility] on 08/30/2019 for Weakness [R53.1] Elevated BUN [R79.9] ESRD (end stage renal disease) ([place]) [N18.6]  1. End stage renal disease:First dialysis on 3/16. Seen and examined on second dialysis treatment. Plan for third treatment tomorrow Using his AVG with no difficulties.  Outpatient planning for [facility] MWF.  - Discontinue sodium bicarbonate.  - Next scheduled dialysis treatment to be done in chair.   2. Hypertension: 108/45. Holding home blood pressure agents of torsemide, lisinopril and amlodipine.  - Continues on carvedilol. Hold before dialysis treatment.   3. 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  Hemoglobin 7.8 Discussed ESA and IV iron with patient. Will initiate on Friday's treatment.   4. Secondary Hyperparathyroidism: PTH 233 - at goal. Not currently on binders.  - Discontinue calcitriol. Will reinitiate as outpatient.   5. Diabetes mellitus type II with chronic kidney disease: hemoglobin A1c of 6.8% on 3/1. Well controlled.     LOS: 1 Yasmina [place] 3/17/202112:04 PM

## 2019-09-01 NOTE — Progress Notes (Signed)
OT Cancellation Note  Patient Details Name: Tyler Hahn MRN: [id number] DOB: [birth year]   Cancelled Treatment:    Reason Eval/Treat Not Completed: Patient at procedure or test/ unavailable;Other (comment)   Order received and chart reviewed.  Patient unavailable for evaluation at this time due to dialysis.  Will follow up as appropriate and able.  Thank you.  Oren Binet 09/01/2019, 9:06 AM

## 2019-09-01 NOTE — Consult Note (Addendum)
Pharmacy Antibiotic Note  Tyler Hahn is a 79 y.o. male admitted on 08/30/2019 with UTI.  Pharmacy has been consulted for levofloxacin dosing. He is ESRD on new HD. His second session was today  Plan:  Start levoofloxacin 750 mg IV once then 500 mg every 48 hours  Height: 5\' 7"  (170.2 cm) Weight: 158 lb 11.7 oz (72 kg) IBW/kg (Calculated) : 66.1  Temp (24hrs), Avg:98.9 F (37.2 C), Min:98.6 F (37 C), Max:99.1 F (37.3 C)  Recent Labs  Lab 08/30/19 1602 08/31/19 0919 09/01/19 0330  WBC 18.5* 15.7* 13.6*  CREATININE 6.58*  --  4.76*    Estimated Creatinine Clearance: 12.2 mL/min (A) (by C-G formula based on SCr of 4.76 mg/dL (H)).    Allergies  Allergen Reactions  . Sulfonamide Derivatives Itching and Rash    Antimicrobials this admission: ceftriaxone 3/16 >> 3/17 levofloxacin 3/17 >>   Microbiology results: 3/16 BCx: NGTD 3/15 UCx: Pseudomonas  3/15 SARS CoV-2: negative  3/15 influenza A/B: negative 3/7 MRSA PCR: negative  Thank you for allowing pharmacy to be a part of this patient's care.  Dallie Piles 09/01/2019 7:38 AM

## 2019-09-01 NOTE — Progress Notes (Signed)
   09/01/19 1300  Clinical Encounter Type  Visited With Patient and family together  Visit Type Initial;Spiritual support  Referral From Chaplain  Consult/Referral To [place] visited with patient and his son, [name]. When hearing Chaplain's name, [name] asked her to repeat it, explaining that was the patient's deceased wife's name. Chaplain said than we won't forget each other and her agreed. Patient wanted prayer for his mind, gout, dialysis, and his heart. Chaplain offered pastoral presence, empathy, and prayer. We ended the conversation by talking about patient belonging to a [employer].

## 2019-09-01 NOTE — Evaluation (Signed)
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 Per physician notes patient did not receive dialysis since his d/c on 3/9. Presented to ER with fatigue and weakness with AMS.   Clinical Impression   Patient seen this date for OT evaluation.  Patient in bed with son, Angelica Chessman, at bedside and agreeable to therapy.  Patient expresses lethargy secondary to dialysis this AM.  Previously, patient was modified independent with ADLs and functional mobility.  Patient occasionally uses power chair when gout pain is bad.  Patient reports he has minimal to no pain and rather just tired from the day.  Provided education on role and goals of OT in acute care setting.  Patient and son verbalized understanding.  Provided education on energy conservation techniques to safely perform self care tasks.  Patient verbalized understanding.  BP reading at 85/59 unchanging with ankle pumps.  Patient states he does not feel lightheaded or dizzy.  Reported vitals to nurse to observe.  Patient would benefit from skilled occupational therapy intervention to address activity tolerance, strengthening, functional transfers, ADL retraining, use of compensatory techniques and energy conservation techniques.  Based on today's performance, recommending HH OT if able to have 24/7 SPV/assistance.    Follow Up Recommendations  Home health OT;Supervision/Assistance - 24 hour    Equipment Recommendations  None recommended by OT    Recommendations for Other Services       Precautions /  Restrictions Precautions Precautions: Fall Restrictions Weight Bearing Restrictions: No      Mobility Bed Mobility Overal bed mobility: Needs Assistance Bed Mobility: Supine to Sit     Supine to sit: Supervision     General bed mobility comments: Extra time and use of bed rails  Transfers Overall transfer level: Needs assistance Equipment used: Rolling walker (2 wheeled) Transfers: Sit to/from Stand Sit to Stand: Min assist;Min guard         General transfer comment: CGA-MIN A secondary to lethargy    Balance                                           ADL either performed or assessed with clinical judgement   ADL Overall ADL's : Needs assistance/impaired Eating/Feeding: Set up;Sitting   Grooming: Wash/dry hands;Wash/dry face;Bed level;Set up;Supervision/safety               Lower Body Dressing: Minimal assistance;Sit to/from stand   Toilet Transfer: Min guard;Minimal assistance;RW   Toileting- Water quality scientist and Hygiene: Min guard;Sit to/from stand       Functional mobility during ADLs: Min guard;Minimal assistance General ADL Comments: Presents with increased lethargy this date secondary to dialysis and required CGA-MIN A for majority of tasks.     Vision Patient Visual Report: No change from baseline       Perception     Praxis      Pertinent Vitals/Pain Pain Assessment: No/denies pain(Patient states "it's not really anything right now")     Hand Dominance Right   Extremity/Trunk Assessment Upper Extremity  Assessment Upper Extremity Assessment: Generalized weakness;LUE deficits/detail LUE Deficits / Details: AROM in shoulder flexion to ~80 degrees   Lower Extremity Assessment Lower Extremity Assessment: Defer to PT evaluation   Cervical / Trunk Assessment Cervical / Trunk Assessment: Normal   Communication Communication Communication: No difficulties   Cognition Arousal/Alertness: Awake/alert Behavior  During Therapy: WFL for tasks assessed/performed Overall Cognitive Status: Within Functional Limits for tasks assessed                                 General Comments: Patient is very lethargic after dialysis this AM, limited motivation to participate.   General Comments       Exercises Other Exercises Other Exercises: Provided education on role and goals of OT in acute care setting Other Exercises: Provided education on general safety regarding use of call light, bed controls etc Other Exercises: Provided education regarding safety and body mechanics during functional transfers   Shoulder Instructions      Home Living Family/patient expects to be discharged to:: Private residence Living Arrangements: Alone(Reports 2 sons live close by) Available Help at Discharge: Family;Other (Comment)(Hired help to clean home 3x/week) Type of Home: Apartment Home Access: Ramped entrance     Home Layout: One level     Bathroom Shower/Tub: Teacher, early years/pre: Standard     Home Equipment: Environmental consultant - 2 wheels;Cane - single point;Grab bars - tub/shower;Grab bars - toilet;Electric scooter;Tub bench   Additional Comments: Patient uses cane/walker for most functional mobility but has power chair when gout is painful      Prior Functioning/Environment Level of Independence: Independent with assistive device(s);Needs assistance  Gait / Transfers Assistance Needed: patient reports he primarily uses walker and cane at home but does use a power chair when his gout acts up. ADL's / Homemaking Assistance Needed: Sons assist with grocery shopping.  Aides clean 3x/week. Communication / Swallowing Assistance Needed: n/a          OT Problem List: Decreased strength;Decreased activity tolerance      OT Treatment/Interventions: Self-care/ADL training;DME and/or AE instruction;Therapeutic activities;Patient/family education;Therapeutic exercise;Energy conservation    OT  Goals(Current goals can be found in the care plan section) Acute Rehab OT Goals Patient Stated Goal: "I don't want to feel so tired" OT Goal Formulation: With patient Time For Goal Achievement: 09/15/19 Potential to Achieve Goals: Good  OT Frequency: Min 1X/week   Barriers to D/C:            Co-evaluation              AM-PAC OT "6 Clicks" Daily Activity     Outcome Measure Help from another person eating meals?: None Help from another person taking care of personal grooming?: None Help from another person toileting, which includes using toliet, bedpan, or urinal?: A Little Help from another person bathing (including washing, rinsing, drying)?: A Lot Help from another person to put on and taking off regular upper body clothing?: A Little Help from another person to put on and taking off regular lower body clothing?: A Little 6 Click Score: 19   End of Session Equipment Utilized During Treatment: Rolling walker Nurse Communication: Other (comment)(BP reading low during session)  Activity Tolerance: Patient tolerated treatment well;No increased pain;Patient limited by lethargy Patient left: in bed;with call bell/phone within reach;with bed alarm set;with family/visitor present  OT Visit Diagnosis: Muscle weakness (generalized) (M62.81);Unsteadiness on feet (R26.81)  Time: 9689-5702 OT Time Calculation (min): 20 min Charges:  OT General Charges $OT Visit: 1 Visit OT Evaluation $OT Eval Low Complexity: 1 Low OT Treatments $Therapeutic Activity: 8-22 mins  Baldomero Lamy, MS, OTR/L 09/01/19, 3:12 PM

## 2019-09-01 NOTE — Progress Notes (Signed)
Patient ID: Tyler Hahn, male   DOB: [birth year], 79 y.o.   MRN: [id number] [facility] PROGRESS NOTE  EMERSEN CARROLL TTS:[id number] DOB: [birth year] DOA: 08/30/2019 PCP: [name], MD  HPI/Subjective: Patient seen after dialysis.  Felt okay.  No problems urinating.  No chest pain or shortness of breath.  Patient taken down to dialysis before breakfast.  Objective: Vitals:   09/01/19 1030 09/01/19 1141  BP: (!) 152/86 (!) 108/45  Pulse: 79 69  Resp: (!) 21 16  Temp:  98.9 F (37.2 C)  SpO2:  93%    Intake/Output Summary (Last 24 hours) at 09/01/2019 1612 Last data filed at 09/01/2019 1300 Gross per 24 hour  Intake 360 ml  Output 650 ml  Net -290 ml   Filed Weights   08/30/19 1558 08/30/19 2248 08/31/19 0940  Weight: 76.2 kg 71.2 kg 72 kg    ROS: Review of Systems  Constitutional: Negative for fever.  Eyes: Negative for blurred vision.  Respiratory: Negative for shortness of breath.   Cardiovascular: Negative for chest pain.  Gastrointestinal: Negative for abdominal pain, nausea and vomiting.  Genitourinary: Negative for dysuria.  Musculoskeletal: Negative for joint pain.  Neurological: Negative for headaches.   Exam: Physical Exam  Constitutional: He is oriented to person, place, and time.  HENT:  Nose: No mucosal edema.  Mouth/Throat: No oropharyngeal exudate or posterior oropharyngeal edema.  Eyes: Pupils are equal, round, and reactive to light. Conjunctivae, EOM and lids are normal.  Cardiovascular: S1 normal and S2 normal. Exam reveals no gallop.  No murmur heard. Respiratory: No respiratory distress. He has no wheezes. He has no rhonchi. He has no rales.  GI: Soft. Bowel sounds are normal. There is no abdominal tenderness.  Musculoskeletal:     Right ankle: No swelling.     Left ankle: No swelling.  Lymphadenopathy:    He has no cervical adenopathy.  Neurological: He is alert and oriented to person, place, and time. No cranial nerve  deficit.  Skin: Skin is warm. No rash noted. Nails show no clubbing.  Psychiatric: He has a normal mood and affect.      Data Reviewed: Basic Metabolic Panel: Recent Labs  Lab 08/30/19 1602 09/01/19 0330  NA 138 144  K 5.2* 4.3  CL 104 106  CO2 17* 25  GLUCOSE 156* 82  BUN 142* 98*  CREATININE 6.58* 4.76*  CALCIUM 9.4 8.6*  PHOS  --  5.0*   Liver Function Tests: Recent Labs  Lab 09/01/19 0330  ALBUMIN 3.1*   CBC: Recent Labs  Lab 08/30/19 1602 08/31/19 0919 09/01/19 0330  WBC 18.5* 15.7* 13.6*  HGB 9.3* 8.1* 7.8*  HCT 28.6* 24.1* 23.7*  MCV 85.4 82.0 83.7  PLT 142* 142* 141*    CBG: Recent Labs  Lab 08/31/19 1627 08/31/19 2133 09/01/19 1140  GLUCAP 98 101* 54*    Recent Results (from the past 240 hour(s))  Surgical pcr screen     Status: None   Collection Time: 08/22/19  9:54 PM   Specimen: Nasal Mucosa; Nasal Swab  Result Value Ref Range Status   MRSA, PCR NEGATIVE NEGATIVE Final   Staphylococcus aureus NEGATIVE NEGATIVE Final    Comment: (NOTE) The Xpert SA Assay (FDA approved for NASAL specimens in patients 58 years of age and older), is one component of a comprehensive surveillance program. It is not intended to diagnose infection nor to guide or monitor treatment. Performed at [facility], [place],  [place] [address]   Respiratory Panel by RT PCR (Flu A&B, Covid) - Nasopharyngeal Swab     Status: None   Collection Time: 08/30/19  8:41 PM   Specimen: Nasopharyngeal Swab  Result Value Ref Range Status   SARS Coronavirus 2 by RT PCR NEGATIVE NEGATIVE Final    Comment: (NOTE) SARS-CoV-2 target nucleic acids are NOT DETECTED. The SARS-CoV-2 RNA is generally detectable in upper respiratoy specimens during the acute phase of infection. The lowest concentration of SARS-CoV-2 viral copies this assay can detect is 131 copies/mL. A negative result does not preclude SARS-Cov-2 infection and should not be used as the sole  basis for treatment or other patient management decisions. A negative result may occur with  improper specimen collection/handling, submission of specimen other than nasopharyngeal swab, presence of viral mutation(s) within the areas targeted by this assay, and inadequate number of viral copies (<131 copies/mL). A negative result must be combined with clinical observations, patient history, and epidemiological information. The expected result is Negative. Fact Sheet for Patients:  PinkCheek.be Fact Sheet for Healthcare Providers:  GravelBags.it This test is not yet ap proved or cleared by the [place] FDA and  has been authorized for detection and/or diagnosis of SARS-CoV-2 by FDA under an Emergency Use Authorization (EUA). This EUA will remain  in effect (meaning this test can be used) for the duration of the COVID-19 declaration under Section 564(b)(1) of the Act, 21 U.S.C. section 360bbb-3(b)(1), unless the authorization is terminated or revoked sooner.    Influenza A by PCR NEGATIVE NEGATIVE Final   Influenza B by PCR NEGATIVE NEGATIVE Final    Comment: (NOTE) The Xpert Xpress SARS-CoV-2/FLU/RSV assay is intended as an aid in  the diagnosis of influenza from Nasopharyngeal swab specimens and  should not be used as a sole basis for treatment. Nasal washings and  aspirates are unacceptable for Xpert Xpress SARS-CoV-2/FLU/RSV  testing. Fact Sheet for Patients: PinkCheek.be Fact Sheet for Healthcare Providers: GravelBags.it This test is not yet approved or cleared by the [place] FDA and  has been authorized for detection and/or diagnosis of SARS-CoV-2 by  FDA under an Emergency Use Authorization (EUA). This EUA will remain  in effect (meaning this test can be used) for the duration of the  Covid-19 declaration under Section 564(b)(1) of the Act, 21  U.S.C.  section 360bbb-3(b)(1), unless the authorization is  terminated or revoked. Performed at [facility], [address], [place], [place] [address]   Urine Culture     Status: Abnormal (Preliminary result)   Collection Time: 08/30/19 11:25 PM   Specimen: Urine, Catheterized  Result Value Ref Range Status   Specimen Description   Final    URINE, CATHETERIZED Performed at [facility], [address], [place], [place] [address]    Special Requests   Final    NONE Performed at [facility], [address] [place], [place] [address]    Culture >=100,000 COLONIES/mL PSEUDOMONAS AERUGINOSA (A)  Final   Report Status PENDING  Incomplete  CULTURE, BLOOD (ROUTINE X 2) w Reflex to ID Panel     Status: None (Preliminary result)   Collection Time: 08/31/19  8:08 AM   Specimen: BLOOD  Result Value Ref Range Status   Specimen Description BLOOD LEFT AC  Final   Special Requests   Final    BOTTLES DRAWN AEROBIC AND ANAEROBIC Blood Culture results may not be optimal due to an excessive volume of blood received in culture bottles  Culture   Final    NO GROWTH 1 DAY Performed at [facility], [address] [place], [place] [address]    Report Status PENDING  Incomplete  CULTURE, BLOOD (ROUTINE X 2) w Reflex to ID Panel     Status: None (Preliminary result)   Collection Time: 08/31/19  8:13 AM   Specimen: BLOOD  Result Value Ref Range Status   Specimen Description BLOOD LEFT HAND  Final   Special Requests   Final    BOTTLES DRAWN AEROBIC AND ANAEROBIC Blood Culture adequate volume   Culture   Final    NO GROWTH 1 DAY Performed at [facility], [address], [place],  [address]    Report Status PENDING  Incomplete     Studies: DG Chest 2 View  Result Date: 08/30/2019 CLINICAL DATA:  Shortness of breath EXAM: CHEST - 2 VIEW COMPARISON:  08/17/2018 FINDINGS: Left-sided pacing device as before. No focal airspace disease or pleural  effusion. Normal cardiomediastinal silhouette. No pneumothorax. IMPRESSION: No active cardiopulmonary disease. Electronically Signed   By: Donavan Foil M.D.   On: 08/30/2019 20:32    Scheduled Meds: . allopurinol  100 mg Oral Daily  . atorvastatin  20 mg Oral Daily  . carvedilol  12.5 mg Oral BID  . Chlorhexidine Gluconate Cloth  6 each Topical Daily  . cholecalciferol  5,000 Units Oral Daily  . feeding supplement (NEPRO CARB STEADY)  237 mL Oral BID BM  . finasteride  5 mg Oral Daily  . heparin  5,000 Units Subcutaneous Q8H  . insulin aspart  0-5 Units Subcutaneous QHS  . insulin aspart  0-6 Units Subcutaneous TID WC  . levothyroxine  25 mcg Oral QAC breakfast  . multivitamin  1 tablet Oral QHS  . sertraline  50 mg Oral Daily   Continuous Infusions: . [START ON 09/03/2019] levofloxacin (LEVAQUIN) IV    . [START ON 09/02/2019] levofloxacin (LEVAQUIN) IV      Assessment/Plan:  1. Pseudomonas urinary tract infection secondary to catheter.  Catheter was discontinued.  Awaiting sensitivities on the urine culture.  I switch antibiotics over the Levaquin. 2. End-stage renal disease.  New start dialysis.  Patient will receive dialysis here on Friday.  He will start as outpatient on Monday.  Patient started dialysis secondary to uremic symptoms hyperkalemia metabolic acidosis. 3. Generalized weakness.  Physical therapy evaluation 4. Type 2 diabetes mellitus with end-stage renal disease.  Hemoglobin A1c good at 6.8.  Only on sliding scale here. 5. Essential hypertension on Norvasc and Coreg 6. Hypothyroidism unspecified on levothyroxine 7. Hyperlipidemia unspecified on atorvastatin 8. Depression on Zoloft  Code Status:     Code Status Orders  (From admission, onward)         Start     Ordered   08/30/19 2121  Full code  Continuous     08/30/19 2130        Code Status History    Date Active Date Inactive Code Status Order ID Comments User Context   08/17/2019 1957 08/24/2019 2020  Full Code [id number]  Rhetta Mura, DO ED   01/27/2014 1020 01/28/2014 1701 Full Code [id number]  Erroll Luna, MD Inpatient   Advance Care Planning Activity     Family Communication: Patient's son called in on the room when I was in the room when I spoke with him on the phone and gave update about plan Disposition Plan: Likely will be discharged on Friday afternoon after dialysis Friday morning.  Consultants:  Nephrology  Antibiotics:  Levaquin  Time spent: 28 minutes  [place]

## 2019-09-01 NOTE — TOC Initial Note (Signed)
Transition of Care [facility]) - Initial/Assessment Note    Patient Details  Name: Tyler Hahn MRN: [id number] Date of Birth: [birth year]  Transition of Care [facility]) CM/SW Contact:    Beverly Sessions, RN Phone Number: 09/01/2019, 1:46 PM  Clinical Narrative:                 Patient admitted from home with ESRD with new start HD.   Patient lives at home alone.  2 sons live locally for support.    PCP [place].  I have requested follow up appointment be made 5-7 days from discharge.   Patient has someone that comes to help around the house 3 days a week.    PT has assessed patient and recommends home health PT.  Patient has RW, cane, electric scooter in the home.   Referral was made to [facility] on 3/9 at discharge from previous facility.  Per Janett Billow with [facility] Patient was not opened due to being unable to reach the patient.  Patient confirmed contact information.  Patient agreeable for home health services at discharge.  Will require resumption orders at discharge  Voicemail left for son [name] to determine who will be taking patient to HD  Requested for RN to have patient sit up in the chair for 4 hours today, or send patient in the chair to HD tomorrow, and document   Expected Discharge Plan: [place] Barriers to Discharge: Continued Medical Work up   Patient Goals and CMS Choice        Expected Discharge Plan and Services Expected Discharge Plan: [place]       Living arrangements for the past 2 months: Apartment                                      Prior Living Arrangements/Services Living arrangements for the past 2 months: Apartment Lives with:: Self Patient language and need for interpreter reviewed:: Yes        Need for Family Participation in Patient Care: Yes (Comment) Care giver support system in place?: Yes (comment) Current home services: DME Criminal Activity/Legal Involvement Pertinent to Current  Situation/Hospitalization: No - Comment as needed  Activities of Daily Living Home Assistive Devices/Equipment: Eyeglasses, Cane (specify quad or straight), Shower chair with back, Wheelchair(WC for gout flare ups) ADL Screening (condition at time of admission) Patient's cognitive ability adequate to safely complete daily activities?: Yes Is the patient deaf or have difficulty hearing?: No Does the patient have difficulty seeing, even when wearing glasses/contacts?: No Does the patient have difficulty concentrating, remembering, or making decisions?: No Patient able to express need for assistance with ADLs?: Yes Does the patient have difficulty dressing or bathing?: No Independently performs ADLs?: Yes (appropriate for developmental age) Does the patient have difficulty walking or climbing stairs?: No Weakness of Legs: Both Weakness of Arms/Hands: None  Permission Sought/Granted                  Emotional Assessment Appearance:: Appears stated age     Orientation: : Oriented to Self, Oriented to Place, Oriented to  Time, Oriented to Situation   Psych Involvement: No (comment)  Admission diagnosis:  Weakness [R53.1] Elevated BUN [R79.9] ESRD (end stage renal disease) ([place]) [N18.6] Patient Active Problem List   Diagnosis Date Noted  . Acute lower UTI   . Elevated BUN   . Weakness   .  ESRD (end stage renal disease) ([place]) 08/30/2019  . Acute renal failure (ARF) ([place]) 08/18/2019  . History of anemia due to CKD 08/18/2019  . Hyperkalemia 08/17/2019  . Hypothyroid 08/16/2019  . Physical exam 02/18/2019  . Greater trochanteric bursitis of left hip 08/04/2018  . Osteoarthritis 09/25/2016  . Screen for colon cancer   . Benign neoplasm of transverse colon   . Edema 07/28/2014  . Elevated LFTs 07/28/2014  . Adjustment disorder with depressed mood 03/24/2014  . Epigastric pain 01/27/2014  . Small bowel mass 12/20/2013  . Abdominal pain, left upper quadrant 11/25/2013  .  Myalgia 07/07/2013  . Loss of weight 04/19/2013  . Polyarthralgia 04/19/2013  . Loss of appetite 04/19/2013  . 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 Right elbow pain 08/17/2012  . 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 SYSTOLIC HEART FAILURE, CHRONIC 06/07/2010  . Chronic kidney disease (CKD), stage IV (severe) ([place]) 04/04/2010  . TESTICULAR MASS 04/04/2010  . DM (diabetes mellitus) type II controlled with renal manifestation ([place]) 03/07/2010  . Hyperlipidemia 03/07/2010  . GOUT 03/07/2010  . HYPERTENSION, BENIGN ESSENTIAL 03/07/2010  . IMPLANTABLE CARDIAC DEFIBRILLATOR -[employer]-SINGLE 03/07/2010   PCP:  [name], MD Pharmacy:   [facility] [address] ([address]), [place] - [address] [place] [address] Phone: [phone] Fax: [phone]  [id number] [address] [place] [address] Phone: [phone] Fax: [phone]  [place], [place] - [place] [address] Phone: [phone] Fax: [phone]  [id number] [place], [place] - [address] AT [facility] [address] [place] [address] Phone: [phone] Fax: [phone]     Social Determinants of Health (SDOH) Interventions    Readmission Risk Interventions Readmission Risk Prevention Plan 09/01/2019  [place] or Home Care Consult Complete  Medication Review (RN Care Manager) Complete  Some recent data might be hidden

## 2019-09-02 DIAGNOSIS — T83511S Infection and inflammatory reaction due to indwelling urethral catheter, sequela: Secondary | ICD-10-CM

## 2019-09-02 LAB — GLUCOSE, CAPILLARY
Glucose-Capillary: 77 mg/dL (ref 70–99)
Glucose-Capillary: 65 mg/dL — ABNORMAL LOW (ref 70–99)
Glucose-Capillary: 155 mg/dL — ABNORMAL HIGH (ref 70–99)
Glucose-Capillary: 99 mg/dL (ref 70–99)
Glucose-Capillary: 126 mg/dL — ABNORMAL HIGH (ref 70–99)

## 2019-09-02 MED ORDER — CARVEDILOL 3.125 MG PO TABS
3.1250 mg | ORAL_TABLET | Freq: Two times a day (BID) | ORAL | Status: DC
  Administered 2019-09-02 – 2019-09-04 (×3): 3.125 mg via ORAL
  Filled 2019-09-02 (×6): qty 1

## 2019-09-02 MED ORDER — HALOPERIDOL LACTATE 5 MG/ML IJ SOLN
1.0000 mg | Freq: Four times a day (QID) | INTRAMUSCULAR | Status: DC | PRN
  Administered 2019-09-02: 1 mg via INTRAVENOUS
  Filled 2019-09-02: qty 1

## 2019-09-02 MED ORDER — QUETIAPINE FUMARATE 25 MG PO TABS
12.5000 mg | ORAL_TABLET | Freq: Every day | ORAL | Status: DC
  Administered 2019-09-02 – 2019-09-03 (×2): 12.5 mg via ORAL
  Filled 2019-09-02 (×2): qty 1

## 2019-09-02 NOTE — Progress Notes (Signed)
PT Cancellation Note  Patient Details Name: Tyler Hahn MRN: [id number] DOB: 04/15/1941   Cancelled Treatment:    Reason Eval/Treat Not Completed: Fatigue/lethargy limiting ability to participate  Pt at dialysis this am.  Returned and eating lunch on arrival.  Declined session today due to overall fatigue.  Chesley Noon 09/02/2019, 1:28 PM

## 2019-09-02 NOTE — Progress Notes (Signed)
New start hemodialysis patient, accepted at [facility] MWF 10:15. Patient can start on Monday 3/22, son is aware and will transporting patient next week .

## 2019-09-02 NOTE — Progress Notes (Signed)
Patient ID: Tyler Hahn, male   DOB: 1940/12/02, 79 y.o.   MRN: [id number] [facility] PROGRESS NOTE  Tyler Hahn JJO:[id number] DOB: Jan 02, 1941 DOA: 08/30/2019 PCP: [name], MD  HPI/Subjective: Patient seen in the dialysis unit after dialysis had finished.  He was lying in the bed.  He offers no complaints.  States he feels okay.  Objective: Vitals:   09/02/19 1055 09/02/19 1156  BP: (!) 137/52 (!) 128/52  Pulse: 78 76  Resp: 19 17  Temp:  (!) 97.5 F (36.4 C)  SpO2:  99%    Intake/Output Summary (Last 24 hours) at 09/02/2019 1540 Last data filed at 09/02/2019 1055 Gross per 24 hour  Intake 150.35 ml  Output 1700 ml  Net -1549.65 ml   Filed Weights   08/30/19 1558 08/30/19 2248 08/31/19 0940  Weight: 76.2 kg 71.2 kg 72 kg    ROS: Review of Systems  Constitutional: Negative for fever.  Eyes: Negative for blurred vision.  Respiratory: Negative for shortness of breath.   Cardiovascular: Negative for chest pain.  Gastrointestinal: Negative for abdominal pain, nausea and vomiting.  Genitourinary: Negative for dysuria.  Musculoskeletal: Negative for joint pain.  Neurological: Negative for headaches.   Exam: Physical Exam  Constitutional: He is oriented to person, place, and time.  HENT:  Nose: No mucosal edema.  Mouth/Throat: No oropharyngeal exudate or posterior oropharyngeal edema.  Eyes: Pupils are equal, round, and reactive to light. Conjunctivae, EOM and lids are normal.  Cardiovascular: S1 normal and S2 normal. Exam reveals no gallop.  No murmur heard. Respiratory: No respiratory distress. He has no wheezes. He has no rhonchi. He has no rales.  GI: Soft. Bowel sounds are normal. There is no abdominal tenderness.  Musculoskeletal:     Right ankle: No swelling.     Left ankle: No swelling.  Lymphadenopathy:    He has no cervical adenopathy.  Neurological: He is alert and oriented to person, place, and time. No cranial nerve deficit.   Skin: Skin is warm. No rash noted. Nails show no clubbing.  Psychiatric: He has a normal mood and affect.      Data Reviewed: Basic Metabolic Panel: Recent Labs  Lab 08/30/19 1602 09/01/19 0330  NA 138 144  K 5.2* 4.3  CL 104 106  CO2 17* 25  GLUCOSE 156* 82  BUN 142* 98*  CREATININE 6.58* 4.76*  CALCIUM 9.4 8.6*  PHOS  --  5.0*   Liver Function Tests: Recent Labs  Lab 09/01/19 0330  ALBUMIN 3.1*   CBC: Recent Labs  Lab 08/30/19 1602 08/31/19 0919 09/01/19 0330  WBC 18.5* 15.7* 13.6*  HGB 9.3* 8.1* 7.8*  HCT 28.6* 24.1* 23.7*  MCV 85.4 82.0 83.7  PLT 142* 142* 141*    CBG: Recent Labs  Lab 09/01/19 1651 09/01/19 2154 09/02/19 0731 09/02/19 1149 09/02/19 1335  GLUCAP 78 92 77 65* 155*    Recent Results (from the past 240 hour(s))  Respiratory Panel by RT PCR (Flu A&B, Covid) - Nasopharyngeal Swab     Status: None   Collection Time: 08/30/19  8:41 PM   Specimen: Nasopharyngeal Swab  Result Value Ref Range Status   SARS Coronavirus 2 by RT PCR NEGATIVE NEGATIVE Final    Comment: (NOTE) SARS-CoV-2 target nucleic acids are NOT DETECTED. The SARS-CoV-2 RNA is generally detectable in upper respiratoy specimens during the acute phase of infection. The lowest concentration of SARS-CoV-2 viral copies this assay can detect is 131 copies/mL. A negative result  does not preclude SARS-Cov-2 infection and should not be used as the sole basis for treatment or other patient management decisions. A negative result may occur with  improper specimen collection/handling, submission of specimen other than nasopharyngeal swab, presence of viral mutation(s) within the areas targeted by this assay, and inadequate number of viral copies (<131 copies/mL). A negative result must be combined with clinical observations, patient history, and epidemiological information. The expected result is Negative. Fact Sheet for Patients:   PinkCheek.be Fact Sheet for Healthcare Providers:  GravelBags.it This test is not yet ap proved or cleared by the [place] FDA and  has been authorized for detection and/or diagnosis of SARS-CoV-2 by FDA under an Emergency Use Authorization (EUA). This EUA will remain  in effect (meaning this test can be used) for the duration of the COVID-19 declaration under Section 564(b)(1) of the Act, 21 U.S.C. section 360bbb-3(b)(1), unless the authorization is terminated or revoked sooner.    Influenza A by PCR NEGATIVE NEGATIVE Final   Influenza B by PCR NEGATIVE NEGATIVE Final    Comment: (NOTE) The Xpert Xpress SARS-CoV-2/FLU/RSV assay is intended as an aid in  the diagnosis of influenza from Nasopharyngeal swab specimens and  should not be used as a sole basis for treatment. Nasal washings and  aspirates are unacceptable for Xpert Xpress SARS-CoV-2/FLU/RSV  testing. Fact Sheet for Patients: PinkCheek.be Fact Sheet for Healthcare Providers: GravelBags.it This test is not yet approved or cleared by the [place] FDA and  has been authorized for detection and/or diagnosis of SARS-CoV-2 by  FDA under an Emergency Use Authorization (EUA). This EUA will remain  in effect (meaning this test can be used) for the duration of the  Covid-19 declaration under Section 564(b)(1) of the Act, 21  U.S.C. section 360bbb-3(b)(1), unless the authorization is  terminated or revoked. Performed at [facility], [address] [place], [place] [address]   Urine Culture     Status: Abnormal (Preliminary result)   Collection Time: 08/30/19 11:25 PM   Specimen: Urine, Catheterized  Result Value Ref Range Status   Specimen Description   Final    URINE, CATHETERIZED Performed at [facility], [address] [place], [place] [address]    Special Requests   Final     NONE Performed at [facility], [address] [place], [place] [address]    Culture >=100,000 COLONIES/mL PSEUDOMONAS AERUGINOSA (A)  Final   Report Status PENDING  Incomplete  CULTURE, BLOOD (ROUTINE X 2) w Reflex to ID Panel     Status: None (Preliminary result)   Collection Time: 08/31/19  8:08 AM   Specimen: BLOOD  Result Value Ref Range Status   Specimen Description BLOOD LEFT AC  Final   Special Requests   Final    BOTTLES DRAWN AEROBIC AND ANAEROBIC Blood Culture results may not be optimal due to an excessive volume of blood received in culture bottles   Culture   Final    NO GROWTH 2 DAYS Performed at [facility], [address] [place], [place] [address]    Report Status PENDING  Incomplete  CULTURE, BLOOD (ROUTINE X 2) w Reflex to ID Panel     Status: None (Preliminary result)   Collection Time: 08/31/19  8:13 AM   Specimen: BLOOD  Result Value Ref Range Status   Specimen Description BLOOD LEFT HAND  Final   Special Requests   Final    BOTTLES DRAWN AEROBIC AND ANAEROBIC Blood Culture adequate volume  Culture   Final    NO GROWTH 2 DAYS Performed at [facility], [address] [place], [place] [address]    Report Status PENDING  Incomplete     Scheduled Meds: . allopurinol  100 mg Oral Daily  . atorvastatin  20 mg Oral Daily  . carvedilol  3.125 mg Oral BID  . Chlorhexidine Gluconate Cloth  6 each Topical Daily  . cholecalciferol  5,000 Units Oral Daily  . feeding supplement (NEPRO CARB STEADY)  237 mL Oral BID BM  . finasteride  5 mg Oral Daily  . heparin  5,000 Units Subcutaneous Q8H  . insulin aspart  0-5 Units Subcutaneous QHS  . insulin aspart  0-6 Units Subcutaneous TID WC  . levothyroxine  25 mcg Oral QAC breakfast  . multivitamin  1 tablet Oral QHS  . sertraline  50 mg Oral Daily   Continuous Infusions: . sodium chloride Stopped (09/02/19 0045)  . [START ON 09/03/2019] levofloxacin (LEVAQUIN) IV       Assessment/Plan:  1. Pseudomonas urinary tract infection secondary to catheter.  Catheter was discontinued.  Awaiting sensitivities on the urine culture.  Continue Levaquin.  Still awaiting sensitivities of urine culture. 2. End-stage renal disease.  New start dialysis.  Patient will receive dialysis here on Friday.  He will start as outpatient on Monday.  Patient started dialysis secondary to uremic symptoms, hyperkalemia, metabolic acidosis. 3. Generalized weakness.  Physical therapy evaluation 4. Type 2 diabetes mellitus with end-stage renal disease.  Hemoglobin A1c good at 6.8.  Only on sliding scale here. 5. Essential hypertension.  Blood pressure on the lower side.  I cut back on the Coreg. 6. Hypothyroidism unspecified on levothyroxine 7. Hyperlipidemia unspecified on atorvastatin 8. Depression on Zoloft  Code Status:     Code Status Orders  (From admission, onward)         Start     Ordered   08/30/19 2121  Full code  Continuous     08/30/19 2130        Code Status History    Date Active Date Inactive Code Status Order ID Comments User Context   08/17/2019 1957 08/24/2019 2020 Full Code [id number]  Rhetta Mura, DO ED   01/27/2014 1020 01/28/2014 1701 Full Code [id number]  Erroll Luna, MD Inpatient   Advance Care Planning Activity     Family Communication: Spoke with the patient's son on the phone Disposition Plan: Hopefully will have everything set up tomorrow after dialysis for him to go home.  I think they change the dialysis location as outpatient still waiting for approval on this.  Would also like to see sensitivities on the Pseudomonas in the urine culture prior to disposition  Consultants:  Nephrology  Antibiotics:  Levaquin  Time spent: 27 minutes  [place]

## 2019-09-02 NOTE — TOC Progression Note (Signed)
Transition of Care [facility]) - Progression Note    Patient Details  Name: Tyler Hahn MRN: [id number] Date of Birth: September 17, 1940  [facility]) CM/SW Contact  Beverly Sessions, RN Phone Number: 09/02/2019, 1:55 PM  Clinical Narrative:    Patient will need to go to HD in chair 09/03/19 Bedside RN aware I also put notification in the treatment team sticky note section   Expected Discharge Plan: [place] Barriers to Discharge: Continued Medical Work up  Expected Discharge Plan and Services Expected Discharge Plan: [place]       Living arrangements for the past 2 months: Apartment                                       Social Determinants of Health (SDOH) Interventions    Readmission Risk Interventions Readmission Risk Prevention Plan 09/01/2019  [place] or Home Care Consult Complete  Medication Review (RN Care Manager) Complete  Some recent data might be hidden

## 2019-09-02 NOTE — Progress Notes (Signed)
OT Cancellation Note  Patient Details Name: ARNULFO BATSON MRN: [id number] DOB: 1940-12-04   Cancelled Treatment:    Reason Eval/Treat Not Completed: Patient at procedure or test/ unavailable  Pt off floor at HD at this time, will f/u as able for OT tx. Thank you.  Gerrianne Scale, [place], OTR/L ascom [phone] 09/02/19, 8:23 AM

## 2019-09-02 NOTE — Progress Notes (Addendum)
[facility]  ROUNDING NOTE   Subjective:   Patient reports feeling "bad" but cant elaborate more than that. He denies any lightheadedness,dizziness, cramping, edema, nausea, itching or shortness of breath.   Objective:  Vital signs in last 24 hours:  Temp:  [97.8 F (36.6 C)-99.3 F (37.4 C)] 97.8 F (36.6 C) (03/18 0750) Pulse Rate:  [65-82] 82 (03/18 1000) Resp:  [15-22] 19 (03/18 1000) BP: (88-152)/(44-86) 88/71 (03/18 1000) SpO2:  [93 %-99 %] 99 % (03/18 0431)  Weight change:  Filed Weights   08/30/19 1558 08/30/19 2248 08/31/19 0940  Weight: 76.2 kg 71.2 kg 72 kg    Intake/Output: I/O last 3 completed shifts: In: 750.4 [P.O.:600; I.V.:0.4; IV Piggyback:150] Out: 850 [Urine:350; Other:500]   Intake/Output this shift:  No intake/output data recorded.  Physical Exam: General: NAD, lying comfortably in bed   Head: Normocephalic, atraumatic. Moist oral mucosal membranes  Eyes: Anicteric, PERRL  Neck: Supple, trachea midline  Lungs:  Clear to auscultation  Heart: Regular rate and rhythm  Abdomen:  Soft, nontender,   Extremities:  No peripheral edema.  Neurologic: Nonfocal, moving all four extremities  Skin: No lesions  Access: Right AVG     Basic Metabolic Panel: Recent Labs  Lab 08/30/19 1602 09/01/19 0330  NA 138 144  K 5.2* 4.3  CL 104 106  CO2 17* 25  GLUCOSE 156* 82  BUN 142* 98*  CREATININE 6.58* 4.76*  CALCIUM 9.4 8.6*  PHOS  --  5.0*    Liver Function Tests: Recent Labs  Lab 09/01/19 0330  ALBUMIN 3.1*   No results for input(s): LIPASE, AMYLASE in the last 168 hours. No results for input(s): AMMONIA in the last 168 hours.  CBC: Recent Labs  Lab 08/30/19 1602 08/31/19 0919 09/01/19 0330  WBC 18.5* 15.7* 13.6*  HGB 9.3* 8.1* 7.8*  HCT 28.6* 24.1* 23.7*  MCV 85.4 82.0 83.7  PLT 142* 142* 141*    Cardiac Enzymes: No results for input(s): CKTOTAL, CKMB, CKMBINDEX, TROPONINI in the last 168 hours.  BNP: Invalid  input(s): POCBNP  CBG: Recent Labs  Lab 08/31/19 2133 09/01/19 1140 09/01/19 1651 09/01/19 2154 09/02/19 0731  GLUCAP 101* 54* 78 92 77    Microbiology: Results for orders placed or performed during the hospital encounter of 08/30/19  Respiratory Panel by RT PCR (Flu A&B, Covid) - Nasopharyngeal Swab     Status: None   Collection Time: 08/30/19  8:41 PM   Specimen: Nasopharyngeal Swab  Result Value Ref Range Status   SARS Coronavirus 2 by RT PCR NEGATIVE NEGATIVE Final    Comment: (NOTE) SARS-CoV-2 target nucleic acids are NOT DETECTED. The SARS-CoV-2 RNA is generally detectable in upper respiratoy specimens during the acute phase of infection. The lowest concentration of SARS-CoV-2 viral copies this assay can detect is 131 copies/mL. A negative result does not preclude SARS-Cov-2 infection and should not be used as the sole basis for treatment or other patient management decisions. A negative result may occur with  improper specimen collection/handling, submission of specimen other than nasopharyngeal swab, presence of viral mutation(s) within the areas targeted by this assay, and inadequate number of viral copies (<131 copies/mL). A negative result must be combined with clinical observations, patient history, and epidemiological information. The expected result is Negative. Fact Sheet for Patients:  PinkCheek.be Fact Sheet for Healthcare Providers:  GravelBags.it This test is not yet ap proved or cleared by the [place] FDA and  has been authorized for detection and/or diagnosis of  SARS-CoV-2 by FDA under an Emergency Use Authorization (EUA). This EUA will remain  in effect (meaning this test can be used) for the duration of the COVID-19 declaration under Section 564(b)(1) of the Act, 21 U.S.C. section 360bbb-3(b)(1), unless the authorization is terminated or revoked sooner.    Influenza A by PCR NEGATIVE  NEGATIVE Final   Influenza B by PCR NEGATIVE NEGATIVE Final    Comment: (NOTE) The Xpert Xpress SARS-CoV-2/FLU/RSV assay is intended as an aid in  the diagnosis of influenza from Nasopharyngeal swab specimens and  should not be used as a sole basis for treatment. Nasal washings and  aspirates are unacceptable for Xpert Xpress SARS-CoV-2/FLU/RSV  testing. Fact Sheet for Patients: PinkCheek.be Fact Sheet for Healthcare Providers: GravelBags.it This test is not yet approved or cleared by the [place] FDA and  has been authorized for detection and/or diagnosis of SARS-CoV-2 by  FDA under an Emergency Use Authorization (EUA). This EUA will remain  in effect (meaning this test can be used) for the duration of the  Covid-19 declaration under Section 564(b)(1) of the Act, 21  U.S.C. section 360bbb-3(b)(1), unless the authorization is  terminated or revoked. Performed at [facility], [address], [place], [place] [address]   Urine Culture     Status: Abnormal (Preliminary result)   Collection Time: 08/30/19 11:25 PM   Specimen: Urine, Catheterized  Result Value Ref Range Status   Specimen Description   Final    URINE, CATHETERIZED Performed at [facility], [address], [place], [place] [address]    Special Requests   Final    NONE Performed at [facility], [name] [place], [place] [address]    Culture >=100,000 COLONIES/mL PSEUDOMONAS AERUGINOSA (A)  Final   Report Status PENDING  Incomplete  CULTURE, BLOOD (ROUTINE X 2) w Reflex to ID Panel     Status: None (Preliminary result)   Collection Time: 08/31/19  8:08 AM   Specimen: BLOOD  Result Value Ref Range Status   Specimen Description BLOOD LEFT AC  Final   Special Requests   Final    BOTTLES DRAWN AEROBIC AND ANAEROBIC Blood Culture results may not be optimal due to an excessive volume of blood received in culture bottles    Culture   Final    NO GROWTH 2 DAYS Performed at [facility], [address] [place], [place] [address]    Report Status PENDING  Incomplete  CULTURE, BLOOD (ROUTINE X 2) w Reflex to ID Panel     Status: None (Preliminary result)   Collection Time: 08/31/19  8:13 AM   Specimen: BLOOD  Result Value Ref Range Status   Specimen Description BLOOD LEFT HAND  Final   Special Requests   Final    BOTTLES DRAWN AEROBIC AND ANAEROBIC Blood Culture adequate volume   Culture   Final    NO GROWTH 2 DAYS Performed at [facility], [address] [place], [place] [address]    Report Status PENDING  Incomplete    Coagulation Studies: No results for input(s): LABPROT, INR in the last 72 hours.  Urinalysis: Recent Labs    08/30/19 2325  COLORURINE YELLOW*  LABSPEC 1.011  PHURINE 5.0  GLUCOSEU NEGATIVE  HGBUR LARGE*  BILIRUBINUR NEGATIVE  KETONESUR NEGATIVE  PROTEINUR 30*  NITRITE POSITIVE*  LEUKOCYTESUR LARGE*      Imaging: No results found.   Medications:    sodium chloride Stopped (09/02/19 0045)   [START ON 09/03/2019]  levofloxacin (LEVAQUIN) IV      allopurinol  100 mg Oral Daily   atorvastatin  20 mg Oral Daily   carvedilol  3.125 mg Oral BID   Chlorhexidine Gluconate Cloth  6 each Topical Daily   cholecalciferol  5,000 Units Oral Daily   feeding supplement (NEPRO CARB STEADY)  237 mL Oral BID BM   finasteride  5 mg Oral Daily   heparin  5,000 Units Subcutaneous Q8H   insulin aspart  0-5 Units Subcutaneous QHS   insulin aspart  0-6 Units Subcutaneous TID WC   levothyroxine  25 mcg Oral QAC breakfast   multivitamin  1 tablet Oral QHS   sertraline  50 mg Oral Daily   sodium chloride, acetaminophen, HYDROcodone-acetaminophen  Assessment/ Plan:  Tyler [facility] is a 79 y.o. black  male with hypertension, diabetes mellitus type II, hypothyroidism, hyperlipidemia, congestive heart failure, AICD,  who was admitted to [facility] on 08/30/2019 for  Weakness [R53.1] Elevated BUN [R79.9] ESRD (end stage renal disease) ([place]) [N18.6]   1. End stage renal disease:First dialysis on 3/16. Seen and examined on third dialysis treatment. Plan for  treatment tomorrow as well to transition to MWF schedule.  Using his AVG with no difficulties.  Outpatient planning for [facility] MWF. Per conversation with Dialysis coordinator no chair placement yet. Waiting on hepatitis labs, which have no resulted.  - Discontinue sodium bicarbonate.  - Next scheduled dialysis treatment to be done in chair.    2. Hypertension: 88/  Holding home blood pressure agents of torsemide, lisinopril and amlodipine.  - Continues on carvedilol. Hold before dialysis treatment.  - Patient UF goal adjusted from 2.5L to 2.0L     3. 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  Hemoglobin 7.8 - EPO with dialysis treatment     4. Secondary Hyperparathyroidism: PTH 233 - at goal. Not currently on binders.  - Discontinue calcitriol. Will reinitiate as outpatient.    5. 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 Well controlled.    LOS: [place] 3/18/202110:15 AM  Patient seen and examined with physician assistant. Agree with above plan.  Outpatient planning for [facility] [address] Patient will be followed by [facility]).   Lavonia Dana, [place] [facility]  3/18/20214:33 PM

## 2019-09-03 DIAGNOSIS — R7881 Bacteremia: Secondary | ICD-10-CM

## 2019-09-03 DIAGNOSIS — B965 Pseudomonas (aeruginosa) (mallei) (pseudomallei) as the cause of diseases classified elsewhere: Secondary | ICD-10-CM

## 2019-09-03 LAB — GLUCOSE, CAPILLARY
Glucose-Capillary: 118 mg/dL — ABNORMAL HIGH (ref 70–99)
Glucose-Capillary: 94 mg/dL (ref 70–99)
Glucose-Capillary: 77 mg/dL (ref 70–99)
Glucose-Capillary: 79 mg/dL (ref 70–99)
Glucose-Capillary: 112 mg/dL — ABNORMAL HIGH (ref 70–99)

## 2019-09-03 LAB — RENAL FUNCTION PANEL
Albumin: 3.1 g/dL — ABNORMAL LOW (ref 3.5–5.0)
Anion gap: 16 — ABNORMAL HIGH (ref 5–15)
BUN: 57 mg/dL — ABNORMAL HIGH (ref 8–23)
CO2: 26 mmol/L (ref 22–32)
Calcium: 8.6 mg/dL — ABNORMAL LOW (ref 8.9–10.3)
Chloride: 97 mmol/L — ABNORMAL LOW (ref 98–111)
Creatinine, Ser: 5.12 mg/dL — ABNORMAL HIGH (ref 0.61–1.24)
GFR calc Af Amer: 12 mL/min — ABNORMAL LOW (ref >60)
GFR calc non Af Amer: 10 mL/min — ABNORMAL LOW (ref >60)
Glucose, Bld: 142 mg/dL — ABNORMAL HIGH (ref 70–99)
Phosphorus: 3.8 mg/dL (ref 2.5–4.6)
Potassium: 3.6 mmol/L (ref 3.5–5.1)
Sodium: 139 mmol/L (ref 135–145)

## 2019-09-03 LAB — URINE CULTURE: Culture: >=100000 — AB

## 2019-09-03 LAB — BLOOD CULTURE ID PANEL (REFLEXED)

## 2019-09-03 LAB — CBC
HCT: 24.3 % — ABNORMAL LOW (ref 39.0–52.0)
Hemoglobin: 7.9 g/dL — ABNORMAL LOW (ref 13.0–17.0)
MCH: 27.4 pg (ref 26.0–34.0)
MCHC: 32.5 g/dL (ref 30.0–36.0)
MCV: 84.4 fL (ref 80.0–100.0)
Platelets: 131 10*3/uL — ABNORMAL LOW (ref 150–400)
RBC: 2.88 MIL/uL — ABNORMAL LOW (ref 4.22–5.81)
RDW: 14.2 % (ref 11.5–15.5)
WBC: 9.4 10*3/uL (ref 4.0–10.5)
nRBC: 0 % (ref 0.0–0.2)

## 2019-09-03 MED ORDER — SODIUM CHLORIDE 0.9 % IV SOLN
1.0000 g | INTRAVENOUS | Status: DC
  Filled 2019-09-03: qty 1

## 2019-09-03 MED ORDER — SODIUM CHLORIDE 0.9 % IV SOLN
2.0000 g | Freq: Once | INTRAVENOUS | Status: AC
  Administered 2019-09-03: 2 g via INTRAVENOUS
  Filled 2019-09-03: qty 2

## 2019-09-03 NOTE — Care Management Important Message (Signed)
Important Message  Patient Details  Name: [facility] MRN: [id number] Date of Birth: [birth year]   Medicare Important Message Given:  Yes     Dannette Barbara 09/03/2019, 1:58 PM

## 2019-09-03 NOTE — Progress Notes (Signed)
Patient ID: [facility], male   DOB: [birth year], 79 y.o.   MRN: [id number] [facility] PROGRESS NOTE  EWARD RUTIGLIANO FGH:[id number] DOB: 01-06-1941 DOA: 08/30/2019 PCP: [name], MD  HPI/Subjective: Patient awakened from sleep.  He does not recall being confused yesterday afternoon.  Slept well.  Offers no complaints.  Objective: Vitals:   09/03/19 0358 09/03/19 1154  BP: (!) 109/52 112/74  Pulse: 70 75  Resp: 16 18  Temp: 98 F (36.7 C) 98 F (36.7 C)  SpO2: 100% 100%    Intake/Output Summary (Last 24 hours) at 09/03/2019 1439 Last data filed at 09/03/2019 0700 Gross per 24 hour  Intake 240 ml  Output 100 ml  Net 140 ml   Filed Weights   08/30/19 1558 08/30/19 2248 08/31/19 0940  Weight: 76.2 kg 71.2 kg 72 kg    ROS: Review of Systems  Constitutional: Negative for fever.  Eyes: Negative for blurred vision.  Respiratory: Negative for shortness of breath.   Cardiovascular: Negative for chest pain.  Gastrointestinal: Negative for abdominal pain, nausea and vomiting.  Genitourinary: Negative for dysuria.  Musculoskeletal: Negative for joint pain.  Neurological: Negative for headaches.   Exam: Physical Exam  HENT:  Nose: No mucosal edema.  Mouth/Throat: No oropharyngeal exudate or posterior oropharyngeal edema.  Eyes: Conjunctivae and lids are normal.  Cardiovascular: S1 normal and S2 normal. Exam reveals no gallop.  No murmur heard. Respiratory: No respiratory distress. He has no wheezes. He has no rhonchi. He has no rales.  GI: Soft. Bowel sounds are normal. There is no abdominal tenderness.  Musculoskeletal:     Right ankle: No swelling.     Left ankle: No swelling.  Lymphadenopathy:    He has no cervical adenopathy.  Neurological: He is alert. No cranial nerve deficit.  Skin: Skin is warm. No rash noted. Nails show no clubbing.  Psychiatric: He has a normal mood and affect.      Data Reviewed: Basic Metabolic Panel: Recent Labs   Lab 08/30/19 1602 09/01/19 0330  NA 138 144  K 5.2* 4.3  CL 104 106  CO2 17* 25  GLUCOSE 156* 82  BUN 142* 98*  CREATININE 6.58* 4.76*  CALCIUM 9.4 8.6*  PHOS  --  5.0*   Liver Function Tests: Recent Labs  Lab 09/01/19 0330  ALBUMIN 3.1*   CBC: Recent Labs  Lab 08/30/19 1602 08/31/19 0919 09/01/19 0330  WBC 18.5* 15.7* 13.6*  HGB 9.3* 8.1* 7.8*  HCT 28.6* 24.1* 23.7*  MCV 85.4 82.0 83.7  PLT 142* 142* 141*    CBG: Recent Labs  Lab 09/02/19 1619 09/02/19 2241 09/03/19 0652 09/03/19 0744 09/03/19 1152  GLUCAP 99 126* 118* 94 77    Recent Results (from the past 240 hour(s))  Respiratory Panel by RT PCR (Flu A&B, Covid) - Nasopharyngeal Swab     Status: None   Collection Time: 08/30/19  8:41 PM   Specimen: Nasopharyngeal Swab  Result Value Ref Range Status   SARS Coronavirus 2 by RT PCR NEGATIVE NEGATIVE Final    Comment: (NOTE) SARS-CoV-2 target nucleic acids are NOT DETECTED. The SARS-CoV-2 RNA is generally detectable in upper respiratoy specimens during the acute phase of infection. The lowest concentration of SARS-CoV-2 viral copies this assay can detect is 131 copies/mL. A negative result does not preclude SARS-Cov-2 infection and should not be used as the sole basis for treatment or other patient management decisions. A negative result may occur with  improper specimen collection/handling, submission  of specimen other than nasopharyngeal swab, presence of viral mutation(s) within the areas targeted by this assay, and inadequate number of viral copies (<131 copies/mL). A negative result must be combined with clinical observations, patient history, and epidemiological information. The expected result is Negative. Fact Sheet for Patients:  PinkCheek.be Fact Sheet for Healthcare Providers:  GravelBags.it This test is not yet ap proved or cleared by the [place] FDA and  has been  authorized for detection and/or diagnosis of SARS-CoV-2 by FDA under an Emergency Use Authorization (EUA). This EUA will remain  in effect (meaning this test can be used) for the duration of the COVID-19 declaration under Section 564(b)(1) of the Act, 21 U.S.C. section 360bbb-3(b)(1), unless the authorization is terminated or revoked sooner.    Influenza A by PCR NEGATIVE NEGATIVE Final   Influenza B by PCR NEGATIVE NEGATIVE Final    Comment: (NOTE) The Xpert Xpress SARS-CoV-2/FLU/RSV assay is intended as an aid in  the diagnosis of influenza from Nasopharyngeal swab specimens and  should not be used as a sole basis for treatment. Nasal washings and  aspirates are unacceptable for Xpert Xpress SARS-CoV-2/FLU/RSV  testing. Fact Sheet for Patients: PinkCheek.be Fact Sheet for Healthcare Providers: GravelBags.it This test is not yet approved or cleared by the [place] FDA and  has been authorized for detection and/or diagnosis of SARS-CoV-2 by  FDA under an Emergency Use Authorization (EUA). This EUA will remain  in effect (meaning this test can be used) for the duration of the  Covid-19 declaration under Section 564(b)(1) of the Act, 21  U.S.C. section 360bbb-3(b)(1), unless the authorization is  terminated or revoked. Performed at [facility], [address] [place], [place] [address]   Urine Culture     Status: Abnormal   Collection Time: 08/30/19 11:25 PM   Specimen: Urine, Catheterized  Result Value Ref Range Status   Specimen Description   Final    URINE, CATHETERIZED Performed at [facility], [address] [place], [place] [address]    Special Requests   Final    NONE Performed at [facility], [address] [place], [place] [address]    Culture >=100,000 COLONIES/mL PSEUDOMONAS AERUGINOSA (A)  Final   Report Status 09/03/2019 FINAL  Final   Organism ID, Bacteria  PSEUDOMONAS AERUGINOSA (A)  Final      Susceptibility   Pseudomonas aeruginosa - MIC*    CEFTAZIDIME 2 SENSITIVE Sensitive     CIPROFLOXACIN <=0.25 SENSITIVE Sensitive     GENTAMICIN <=1 SENSITIVE Sensitive     IMIPENEM 2 SENSITIVE Sensitive     PIP/TAZO 8 SENSITIVE Sensitive     CEFEPIME 2 SENSITIVE Sensitive     * >=100,000 COLONIES/mL PSEUDOMONAS AERUGINOSA  CULTURE, BLOOD (ROUTINE X 2) w Reflex to ID Panel     Status: None (Preliminary result)   Collection Time: 08/31/19  8:08 AM   Specimen: BLOOD  Result Value Ref Range Status   Specimen Description BLOOD LEFT AC  Final   Special Requests   Final    BOTTLES DRAWN AEROBIC AND ANAEROBIC Blood Culture results may not be optimal due to an excessive volume of blood received in culture bottles   Culture   Final    NO GROWTH 3 DAYS Performed at [facility], [address], [place], [place] [address]    Report Status PENDING  Incomplete  CULTURE, BLOOD (ROUTINE X 2) w Reflex to ID Panel     Status: None (Preliminary result)  Collection Time: 08/31/19  8:13 AM   Specimen: BLOOD  Result Value Ref Range Status   Specimen Description BLOOD LEFT HAND  Final   Special Requests   Final    BOTTLES DRAWN AEROBIC AND ANAEROBIC Blood Culture adequate volume   Culture  Setup Time   Final    GRAM NEGATIVE RODS ANAEROBIC BOTTLE ONLY Organism ID to follow CRITICAL RESULT CALLED TO, READ BACK BY AND VERIFIED WITH: [place] 8127 09/03/19 HNM Performed at [facility], [address] [place], [place] [address]    Culture PENDING  Incomplete   Report Status PENDING  Incomplete  Blood Culture ID Panel (Reflexed)     Status: Abnormal   Collection Time: 08/31/19  8:13 AM  Result Value Ref Range Status   Enterococcus species NOT DETECTED NOT DETECTED Final   Listeria monocytogenes NOT DETECTED NOT DETECTED Final   Staphylococcus species NOT DETECTED NOT DETECTED Final   Staphylococcus aureus (BCID) NOT DETECTED NOT  DETECTED Final   Streptococcus species NOT DETECTED NOT DETECTED Final   Streptococcus agalactiae NOT DETECTED NOT DETECTED Final   Streptococcus pneumoniae NOT DETECTED NOT DETECTED Final   Streptococcus pyogenes NOT DETECTED NOT DETECTED Final   Acinetobacter baumannii NOT DETECTED NOT DETECTED Final   Enterobacteriaceae species NOT DETECTED NOT DETECTED Final   Enterobacter cloacae complex NOT DETECTED NOT DETECTED Final   Escherichia coli NOT DETECTED NOT DETECTED Final   Klebsiella oxytoca NOT DETECTED NOT DETECTED Final   Klebsiella pneumoniae NOT DETECTED NOT DETECTED Final   Proteus species NOT DETECTED NOT DETECTED Final   Serratia marcescens NOT DETECTED NOT DETECTED Final   Carbapenem resistance NOT DETECTED NOT DETECTED Final   Haemophilus influenzae NOT DETECTED NOT DETECTED Final   Neisseria meningitidis NOT DETECTED NOT DETECTED Final   Pseudomonas aeruginosa DETECTED (A) NOT DETECTED Final    Comment: CRITICAL RESULT CALLED TO, READ BACK BY AND VERIFIED WITH: Hart Robinsons PHARMD [id number] 09/03/19 HNM    Candida albicans NOT DETECTED NOT DETECTED Final   Candida glabrata NOT DETECTED NOT DETECTED Final   Candida krusei NOT DETECTED NOT DETECTED Final   Candida parapsilosis NOT DETECTED NOT DETECTED Final   Candida tropicalis NOT DETECTED NOT DETECTED Final    Comment: Performed at [facility], [address] [place], [place] [address]     Scheduled Meds: . allopurinol  100 mg Oral Daily  . atorvastatin  20 mg Oral Daily  . carvedilol  3.125 mg Oral BID  . Chlorhexidine Gluconate Cloth  6 each Topical Daily  . cholecalciferol  5,000 Units Oral Daily  . feeding supplement (NEPRO CARB STEADY)  237 mL Oral BID BM  . finasteride  5 mg Oral Daily  . heparin  5,000 Units Subcutaneous Q8H  . insulin aspart  0-5 Units Subcutaneous QHS  . insulin aspart  0-6 Units Subcutaneous TID WC  . levothyroxine  25 mcg Oral QAC breakfast  . multivitamin  1 tablet Oral QHS  .  QUEtiapine  12.5 mg Oral QHS  . sertraline  50 mg Oral Daily   Continuous Infusions: . sodium chloride Stopped (09/02/19 0045)  . [START ON 09/04/2019] cefTAZidime (FORTAZ)  IV      Assessment/Plan:  1. Bacteremia with Pseudomonas.  Pseudomonas urinary tract infection secondary to catheter.  Catheter was discontinued.  Antibiotics were switched to ceftazidime while here in the hospital.  Will switch back to Levaquin upon discharge.  Will need a total of 14 days of antibiotics. 2.  End-stage renal disease.  New start dialysis.  Family switched off dialysis centers and patient will now be on Tuesday Thursday Saturday and will get dialysis here tomorrow and be discharged after that.  Patient started dialysis secondary to uremic symptoms, hyperkalemia, metabolic acidosis. 3. Generalized weakness.  Physical therapy evaluation 4. Type 2 diabetes mellitus with end-stage renal disease.  Hemoglobin A1c good at 6.8.  Only on sliding scale here. 5. Essential hypertension.  Blood pressure on the lower side.  I cut back on the Coreg. 6. Hypothyroidism unspecified on levothyroxine 7. Hyperlipidemia unspecified on atorvastatin 8. Depression on Zoloft 9. Confusion here in the hospital.  Seroquel at night while here.  Code Status:     Code Status Orders  (From admission, onward)         Start     Ordered   08/30/19 2121  Full code  Continuous     08/30/19 2130        Code Status History    Date Active Date Inactive Code Status Order ID Comments User Context   08/17/2019 1957 08/24/2019 2020 Full Code [id number]  Rhetta Mura, DO ED   01/27/2014 1020 01/28/2014 1701 Full Code [id number]  Erroll Luna, MD Inpatient   Advance Care Planning Activity     Family Communication: Spoke with the patient's son on the phone Disposition Plan: Family switched up dialysis center.  Patient will get dialysis here on Saturday and then be discharged home with home health tomorrow after  dialysis  Consultants:  Nephrology  Antibiotics:  Levaquin  Time spent: 26 minutes.  Case discussed with nephrology  Loletha Grayer  [facility]

## 2019-09-03 NOTE — Progress Notes (Signed)
OT Cancellation Note  Patient Details Name: KALIM KISSEL MRN: [id number] DOB: [birth year]   Cancelled Treatment:    Reason Eval/Treat Not Completed: Other (comment)  Pt on phone when OT presents. Will f/u as able for OT treatment. Thank you.  Gerrianne Scale, [place], OTR/L ascom [phone] 09/03/19, 3:44 PM

## 2019-09-03 NOTE — Progress Notes (Signed)
Patient accepted at [facility] 10:25. Needs to arrive by 10:00 on Tuesday 3/23. Son is aware.

## 2019-09-03 NOTE — TOC Progression Note (Signed)
Transition of Care [facility]) - Progression Note    Patient Details  Name: Tyler [facility]ahn MRN: [id number] Date of Birth: 05-25-1941  Transition [facility]) CM/SW Contact  Beverly Sessions, RN Phone Number: 09/03/2019, 5:13 PM  Clinical Narrative:    Plan for patient to discharge tomorrow after HD.  Will start outpatient HD on Tuesday.  Son will be transporting.   Otilio Miu notified that plan for discharge tomorrow.  Resumption orders are in    Expected Discharge Plan: [place] Barriers to Discharge: Continued Medical Work up  Expected Discharge Plan and Services Expected Discharge Plan: [place] arrangements for the past 2 months: Apartment                                       Social Determinants of Health (SDOH) Interventions    Readmission Risk Interventions Readmission Risk Prevention Plan 09/01/2019  [place] or Home Care Consult Complete  Medication Review (RN Care Manager) Complete  Some recent data might be hidden

## 2019-09-03 NOTE — Progress Notes (Signed)
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 Per physician notes patient did not receive dialysis since his d/c on 3/9. Presented to ER with fatigue and weakness with AMS.    PT Comments    Pt was long sitting in bed upon arriving. He agrees to PT session and is cooperative and pleasant throughout. Pt was A and o x 4 this date. He was able to exit L side of bed with supervision. Stood and ambulated one lap in hallway with RW + CGA. No LOB noted. Vitals WFL throughout. Overall pt tolerated PT session well. Therapist recommends DC to home with HHPT to follow and address deficits with strength and safe functional mobility.    Follow Up Recommendations  Home health PT;Supervision - Intermittent     Equipment Recommendations  None recommended by PT    Recommendations for Other Services       Precautions / Restrictions Precautions Precautions: Fall Restrictions Weight Bearing Restrictions: No    Mobility  Bed Mobility Overal bed mobility: Needs Assistance Bed Mobility: Supine to Sit     Supine to sit: Supervision Sit to supine: Supervision   General bed mobility comments: pt demonstrates safe ability to exit/re-enter bed without physical assistance  Transfers Overall transfer level: Modified independent Equipment used: Rolling walker (2 wheeled) Transfers: Sit to/from Stand Sit to Stand: Supervision            Ambulation/Gait Ambulation/Gait assistance: Min guard;Supervision Gait Distance (Feet): 160 Feet Assistive device: Rolling  walker (2 wheeled) Gait Pattern/deviations: WFL(Within Functional Limits) Gait velocity: decreased   General Gait Details: pt was able to ambulate one lap around the RN station with RW + CGA. no LOB noted   Stairs             Wheelchair Mobility    Modified Rankin (Stroke Patients Only)       Balance                                            Cognition Arousal/Alertness: Awake/alert Behavior During Therapy: WFL for tasks assessed/performed Overall Cognitive Status: Within Functional Limits for tasks assessed                                 General Comments: pt was A and O x 3 this date. was a good historian and able to follow commands well.      Exercises      General Comments        Pertinent Vitals/Pain Pain Assessment: 0-10 Pain Score: 2  Pain Location: L arm  Pain Descriptors / Indicators: Sore Pain Intervention(s): Monitored during session    Home Living                      Prior Function            PT Goals (current goals can now be found in the care plan section)  Acute Rehab PT Goals Patient Stated Goal: "I don't want to feel so tired" Progress towards PT goals: Progressing toward goals    Frequency    Min 2X/week      PT Plan Current plan remains appropriate    Co-evaluation              AM-PAC PT "6 Clicks" Mobility   Outcome Measure  Help needed turning from your back to your side while in a flat bed without using bedrails?: None Help needed moving from lying on your back to sitting on the side of a flat bed without using bedrails?: None Help needed moving to and from a bed to a chair (including a wheelchair)?: None Help needed standing up from a chair using your arms (e.g., wheelchair or bedside chair)?: A Little Help needed to walk in hospital room?: A Little Help needed climbing 3-5 steps with a railing? : A Little 6 Click Score: 21    End of Session Equipment Utilized  During Treatment: Gait belt Activity Tolerance: Patient tolerated treatment well Patient left: in bed;with call bell/phone within reach;with bed alarm set Nurse Communication: Mobility status PT Visit Diagnosis: Unsteadiness on feet (R26.81);Muscle weakness (generalized) (M62.81);Other abnormalities of gait and mobility (R26.89);Difficulty in walking, not elsewhere classified (R26.2)     Time: 5681-2751 PT Time Calculation (min) (ACUTE ONLY): 25 min  Charges:  $Gait Training: 8-22 mins $Therapeutic Activity: 8-22 mins                     Julaine Fusi PTA 09/03/19, 1:56 PM

## 2019-09-03 NOTE — Consult Note (Signed)
Infectious Disease     Reason for Consult: Pseudomonal bacteremia, UTI    Referring Physician: Dr Earleen Newport Date of Admission:  08/30/2019   Active Problems:   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[place])   Essential hypertension   ESRD (end stage renal disease) ([place])   Acute lower UTI   Elevated BUN   Weakness   Pseudomonas infection   Urinary tract infection associated with indwelling urethral catheter ([place])   HPI: Tyler Hahn is a 79 y.o. male (with PMH noted below) admitted 3/15 with increasing confusion, leukocytosis after missing HD sessions since dc 3/9.  He has resumed HD and was found to have UTI with Pseudomonas and associated bacteremia.  Started cipro 3/17 and has been AF and WBC down to 13.   Past Medical History:  Diagnosis Date  . Anxiety   . Automatic implantable cardioverter-defibrillator in situ    greg taylor  . CHF (congestive heart failure) ([place])    2000  . Diabetes mellitus    no meds  . Fatty liver   . Gout    "bout 2-3 months ago"-meds helped.  . Hyperlipidemia   . Hypertension   . Hyperthyroidism   . Kidney cysts   . PONV (postoperative nausea and vomiting)   . Renal insufficiency    Past Surgical History:  Procedure Laterality Date  . AV FISTULA PLACEMENT Right 08/23/2019   Procedure: ARTERIOVENOUS GORTEX GRAFT RIGHT ARM;  Surgeon: Rosetta Posner, MD;  Location: [facility];  Service: Vascular;  Laterality: Right;  . CARDIAC DEFIBRILLATOR PLACEMENT    . COLONOSCOPY WITH PROPOFOL N/A 10/03/2015   Procedure: COLONOSCOPY WITH PROPOFOL;  Surgeon: Ladene Artist, MD;  Location: [facility];  Service: Endoscopy;  Laterality: N/A;  . DIAGNOSTIC LAPAROSCOPY  01/27/2014   Dr Brantley Stage  . ENTEROSCOPY N/A 11/25/2013   Procedure: ENTEROSCOPY;  Surgeon: Ladene Artist, MD;  Location: [facility];  Service: Endoscopy;  Laterality: N/A;  . ICD,[place] [employer]    . LAPAROSCOPY N/A 01/27/2014   Procedure: LAPAROSCOPY DIAGNOSTIC;  Surgeon:  Joyice Faster. Cornett, MD;  Location: [facility];  Service: General;  Laterality: N/A;  . polyp removal throat     difficulty speaking   Social History   Tobacco Use  . Smoking status: Former Smoker    Types: Cigarettes    Quit date: 11/26/1998    Years since quitting: 20.7  . Smokeless tobacco: Never Used  Substance Use Topics  . Alcohol use: No    Alcohol/week: 0.0 standard drinks  . Drug use: No   Family History  Problem Relation Age of Onset  . Stomach cancer Mother   . Alzheimer's disease Mother   . Diabetes Father   . Heart disease Father   . Liver disease Father   . Kidney disease Father     Allergies:  Allergies  Allergen Reactions  . Sulfonamide Derivatives Itching and Rash    Current antibiotics: Antibiotics Given (last 72 hours)    Date/Time Action Medication Dose Rate   08/31/19 0904 New Bag/Given   cefTRIAXone (ROCEPHIN) 1 g in sodium chloride 0.9 % 100 mL IVPB 1 g 200 mL/hr   09/01/19 1345 New Bag/Given   ciprofloxacin (CIPRO) IVPB 400 mg 400 mg 200 mL/hr   09/01/19 2310 New Bag/Given  [med not avail]   levofloxacin (LEVAQUIN) IVPB 750 mg 750 mg 100 mL/hr      MEDICATIONS: . allopurinol  100 mg Oral Daily  . atorvastatin  20 mg  Oral Daily  . carvedilol  3.125 mg Oral BID  . Chlorhexidine Gluconate Cloth  6 each Topical Daily  . cholecalciferol  5,000 Units Oral Daily  . feeding supplement (NEPRO CARB STEADY)  237 mL Oral BID BM  . finasteride  5 mg Oral Daily  . heparin  5,000 Units Subcutaneous Q8H  . insulin aspart  0-5 Units Subcutaneous QHS  . insulin aspart  0-6 Units Subcutaneous TID WC  . levothyroxine  25 mcg Oral QAC breakfast  . multivitamin  1 tablet Oral QHS  . QUEtiapine  12.5 mg Oral QHS  . sertraline  50 mg Oral Daily    Review of Systems - unable to obtain  OBJECTIVE: Temp:  [97.5 F (36.4 C)-98.6 F (37 C)] 98 F (36.7 C) (03/19 0358) Pulse Rate:  [70-82] 70 (03/19 0358) Resp:  [15-22] 16 (03/19 0358) BP: (88-138)/(42-71)  109/52 (03/19 0358) SpO2:  [99 %-100 %] 100 % (03/19 0358) Physical Exam  Constitutional: chronically ill appearing, sleepy bu arousable.  HENT: anicteric Mouth/Throat: Oropharynx is clear and dry  Cardiovascular: Normal rate, regular rhythm and normal heart sounds. AICD site wnl Pulmonary/Chest: Effort normal and breath sounds normal. No respiratory distress. He has no wheezes.  Abdominal: Soft. Bowel sounds are normal. He exhibits no distension. There is no tenderness.  RUE AVF site with good thrill, nontender Lymphadenopathy: He has no cervical adenopathy.  Neurological: sleepy bur arousable Skin: Skin is warm and dry. No rash noted. No erythema.  Psychiatric: confused    LABS: Results for orders placed or performed during the hospital encounter of 08/30/19 (from the past 48 hour(s))  Glucose, capillary     Status: Abnormal   Collection Time: 09/01/19 11:40 AM  Result Value Ref Range   Glucose-Capillary 54 (L) 70 - 99 mg/dL    Comment: Glucose reference range applies only to samples taken after fasting for at least 8 hours.   Comment 1 Notify RN   Glucose, capillary     Status: None   Collection Time: 09/01/19  4:51 PM  Result Value Ref Range   Glucose-Capillary 78 70 - 99 mg/dL    Comment: Glucose reference range applies only to samples taken after fasting for at least 8 hours.   Comment 1 Notify RN   Glucose, capillary     Status: None   Collection Time: 09/01/19  9:54 PM  Result Value Ref Range   Glucose-Capillary 92 70 - 99 mg/dL    Comment: Glucose reference range applies only to samples taken after fasting for at least 8 hours.  Glucose, capillary     Status: None   Collection Time: 09/02/19  7:31 AM  Result Value Ref Range   Glucose-Capillary 77 70 - 99 mg/dL    Comment: Glucose reference range applies only to samples taken after fasting for at least 8 hours.  Glucose, capillary     Status: Abnormal   Collection Time: 09/02/19 11:49 AM  Result Value Ref Range    Glucose-Capillary 65 (L) 70 - 99 mg/dL    Comment: Glucose reference range applies only to samples taken after fasting for at least 8 hours.  Glucose, capillary     Status: Abnormal   Collection Time: 09/02/19  1:35 PM  Result Value Ref Range   Glucose-Capillary 155 (H) 70 - 99 mg/dL    Comment: Glucose reference range applies only to samples taken after fasting for at least 8 hours.  Glucose, capillary     Status: None  Collection Time: 09/02/19  4:19 PM  Result Value Ref Range   Glucose-Capillary 99 70 - 99 mg/dL    Comment: Glucose reference range applies only to samples taken after fasting for at least 8 hours.  Glucose, capillary     Status: Abnormal   Collection Time: 09/02/19 10:41 PM  Result Value Ref Range   Glucose-Capillary 126 (H) 70 - 99 mg/dL    Comment: Glucose reference range applies only to samples taken after fasting for at least 8 hours.  Glucose, capillary     Status: Abnormal   Collection Time: 09/03/19  6:52 AM  Result Value Ref Range   Glucose-Capillary 118 (H) 70 - 99 mg/dL    Comment: Glucose reference range applies only to samples taken after fasting for at least 8 hours.  Glucose, capillary     Status: None   Collection Time: 09/03/19  7:44 AM  Result Value Ref Range   Glucose-Capillary 94 70 - 99 mg/dL    Comment: Glucose reference range applies only to samples taken after fasting for at least 8 hours.   No components found for: ESR, C REACTIVE PROTEIN MICRO: Recent Results (from the past 720 hour(s))  SARS CORONAVIRUS 2 (TAT 6-24 HRS) Nasopharyngeal Nasopharyngeal Swab     Status: None   Collection Time: 08/17/19  7:59 PM   Specimen: Nasopharyngeal Swab  Result Value Ref Range Status   SARS Coronavirus 2 NEGATIVE NEGATIVE Final    Comment: (NOTE) SARS-CoV-2 target nucleic acids are NOT DETECTED. The SARS-CoV-2 RNA is generally detectable in upper and lower respiratory specimens during the acute phase of infection. Negative results do not preclude  SARS-CoV-2 infection, do not rule out co-infections with other pathogens, and should not be used as the sole basis for treatment or other patient management decisions. Negative results must be combined with clinical observations, patient history, and epidemiological information. The expected result is Negative. Fact Sheet for Patients: SugarRoll.be Fact Sheet for Healthcare Providers: https://www.woods-mathews.com/ This test is not yet approved or cleared by the [place] FDA and  has been authorized for detection and/or diagnosis of SARS-CoV-2 by FDA under an Emergency Use Authorization (EUA). This EUA will remain  in effect (meaning this test can be used) for the duration of the COVID-19 declaration under Section 56 4(b)(1) of the Act, 21 U.S.C. section 360bbb-3(b)(1), unless the authorization is terminated or revoked sooner. Performed at [facility], [place] [address], [place], [place] [address]   MRSA PCR Screening     Status: None   Collection Time: 08/18/19  1:27 AM   Specimen: Nasal Mucosa; Nasopharyngeal  Result Value Ref Range Status   MRSA by PCR NEGATIVE NEGATIVE Final    Comment:        The GeneXpert MRSA Assay (FDA approved for NASAL specimens only), is one component of a comprehensive MRSA colonization surveillance program. It is not intended to diagnose MRSA infection nor to guide or monitor treatment for MRSA infections. Performed at [facility], [place] [address], [place], [place] [address]   Surgical pcr screen     Status: None   Collection Time: 08/22/19  9:54 PM   Specimen: Nasal Mucosa; Nasal Swab  Result Value Ref Range Status   MRSA, PCR NEGATIVE NEGATIVE Final   Staphylococcus aureus NEGATIVE NEGATIVE Final    Comment: (NOTE) The Xpert SA Assay (FDA approved for NASAL specimens in patients 14 years of age and older), is one component of a comprehensive surveillance program. It is  not intended to  diagnose infection nor to guide or monitor treatment. Performed at [facility], [place] [address], [place], [place] [address]   Respiratory Panel by RT PCR (Flu A&B, Covid) - Nasopharyngeal Swab     Status: None   Collection Time: 08/30/19  8:41 PM   Specimen: Nasopharyngeal Swab  Result Value Ref Range Status   SARS Coronavirus 2 by RT PCR NEGATIVE NEGATIVE Final    Comment: (NOTE) SARS-CoV-2 target nucleic acids are NOT DETECTED. The SARS-CoV-2 RNA is generally detectable in upper respiratoy specimens during the acute phase of infection. The lowest concentration of SARS-CoV-2 viral copies this assay can detect is 131 copies/mL. A negative result does not preclude SARS-Cov-2 infection and should not be used as the sole basis for treatment or other patient management decisions. A negative result may occur with  improper specimen collection/handling, submission of specimen other than nasopharyngeal swab, presence of viral mutation(s) within the areas targeted by this assay, and inadequate number of viral copies (<131 copies/mL). A negative result must be combined with clinical observations, patient history, and epidemiological information. The expected result is Negative. Fact Sheet for Patients:  PinkCheek.be Fact Sheet for Healthcare Providers:  GravelBags.it This test is not yet ap proved or cleared by the [place] FDA and  has been authorized for detection and/or diagnosis of SARS-CoV-2 by FDA under an Emergency Use Authorization (EUA). This EUA will remain  in effect (meaning this test can be used) for the duration of the COVID-19 declaration under Section 564(b)(1) of the Act, 21 U.S.C. section 360bbb-3(b)(1), unless the authorization is terminated or revoked sooner.    Influenza A by PCR NEGATIVE NEGATIVE Final   Influenza B by PCR NEGATIVE NEGATIVE Final    Comment: (NOTE) The Xpert Xpress  SARS-CoV-2/FLU/RSV assay is intended as an aid in  the diagnosis of influenza from Nasopharyngeal swab specimens and  should not be used as a sole basis for treatment. Nasal washings and  aspirates are unacceptable for Xpert Xpress SARS-CoV-2/FLU/RSV  testing. Fact Sheet for Patients: PinkCheek.be Fact Sheet for Healthcare Providers: GravelBags.it This test is not yet approved or cleared by the [place] FDA and  has been authorized for detection and/or diagnosis of SARS-CoV-2 by  FDA under an Emergency Use Authorization (EUA). This EUA will remain  in effect (meaning this test can be used) for the duration of the  Covid-19 declaration under Section 564(b)(1) of the Act, 21  U.S.C. section 360bbb-3(b)(1), unless the authorization is  terminated or revoked. Performed at [facility], [address] [place], [place] [address]   Urine Culture     Status: Abnormal (Preliminary result)   Collection Time: 08/30/19 11:25 PM   Specimen: Urine, Catheterized  Result Value Ref Range Status   Specimen Description URINE, CATHETERIZED  Final   Special Requests NONE  Final   Culture >=100,000 COLONIES/mL PSEUDOMONAS AERUGINOSA (A)  Final   Report Status PENDING  Incomplete   Organism ID, Bacteria PSEUDOMONAS AERUGINOSA (A)  Final      Susceptibility   Pseudomonas aeruginosa - MIC*    CEFTAZIDIME 2 SENSITIVE Sensitive     CIPROFLOXACIN <=0.25 SENSITIVE Sensitive     GENTAMICIN <=1 SENSITIVE Sensitive     IMIPENEM 2 SENSITIVE Sensitive     PIP/TAZO 8 SENSITIVE Sensitive     CEFEPIME Value in next row Sensitive      2 SENSITIVEPerformed at [facility] [address] [place], [place] [address]    * >=  100,000 COLONIES/mL PSEUDOMONAS AERUGINOSA  CULTURE, BLOOD (ROUTINE X 2) w Reflex to ID Panel     Status: None (Preliminary result)   Collection Time: 08/31/19  8:08 AM   Specimen: BLOOD  Result Value Ref Range Status    Specimen Description BLOOD LEFT AC  Final   Special Requests   Final    BOTTLES DRAWN AEROBIC AND ANAEROBIC Blood Culture results may not be optimal due to an excessive volume of blood received in culture bottles   Culture   Final    NO GROWTH 3 DAYS Performed at [facility], [address] [place], [place] [address]    Report Status PENDING  Incomplete  CULTURE, BLOOD (ROUTINE X 2) w Reflex to ID Panel     Status: None (Preliminary result)   Collection Time: 08/31/19  8:13 AM   Specimen: BLOOD  Result Value Ref Range Status   Specimen Description BLOOD LEFT HAND  Final   Special Requests   Final    BOTTLES DRAWN AEROBIC AND ANAEROBIC Blood Culture adequate volume   Culture  Setup Time   Final    GRAM NEGATIVE RODS ANAEROBIC BOTTLE ONLY Organism ID to follow CRITICAL RESULT CALLED TO, READ BACK BY AND VERIFIED WITH: Hart Robinsons [facility] [id number] 09/03/19 HNM    Culture   Final    NO GROWTH 3 DAYS Performed at [facility], [address] [place], [place] [address]    Report Status PENDING  Incomplete  Blood Culture ID Panel (Reflexed)     Status: Abnormal   Collection Time: 08/31/19  8:13 AM  Result Value Ref Range Status   Enterococcus species NOT DETECTED NOT DETECTED Final   Listeria monocytogenes NOT DETECTED NOT DETECTED Final   Staphylococcus species NOT DETECTED NOT DETECTED Final   Staphylococcus aureus (BCID) NOT DETECTED NOT DETECTED Final   Streptococcus species NOT DETECTED NOT DETECTED Final   Streptococcus agalactiae NOT DETECTED NOT DETECTED Final   Streptococcus pneumoniae NOT DETECTED NOT DETECTED Final   Streptococcus pyogenes NOT DETECTED NOT DETECTED Final   Acinetobacter baumannii NOT DETECTED NOT DETECTED Final   Enterobacteriaceae species NOT DETECTED NOT DETECTED Final   Enterobacter cloacae complex NOT DETECTED NOT DETECTED Final   Escherichia coli NOT DETECTED NOT DETECTED Final   Klebsiella oxytoca NOT DETECTED NOT DETECTED Final    Klebsiella pneumoniae NOT DETECTED NOT DETECTED Final   Proteus species NOT DETECTED NOT DETECTED Final   Serratia marcescens NOT DETECTED NOT DETECTED Final   Carbapenem resistance NOT DETECTED NOT DETECTED Final   Haemophilus influenzae NOT DETECTED NOT DETECTED Final   Neisseria meningitidis NOT DETECTED NOT DETECTED Final   Pseudomonas aeruginosa DETECTED (A) NOT DETECTED Final    Comment: CRITICAL RESULT CALLED TO, READ BACK BY AND VERIFIED WITH: Hart Robinsons [facility] [id number] 09/03/19 HNM    Candida albicans NOT DETECTED NOT DETECTED Final   Candida glabrata NOT DETECTED NOT DETECTED Final   Candida krusei NOT DETECTED NOT DETECTED Final   Candida parapsilosis NOT DETECTED NOT DETECTED Final   Candida tropicalis NOT DETECTED NOT DETECTED Final    Comment: Performed at [facility], [address] [place], [place] [address]    IMAGING: [place] Chest 2 View  Result Date: 08/30/2019 CLINICAL DATA:  Shortness of breath EXAM: CHEST - 2 VIEW COMPARISON:  08/17/2018 FINDINGS: Left-sided pacing device as before. No focal airspace disease or pleural effusion. Normal cardiomediastinal silhouette. No pneumothorax. IMPRESSION: No active cardiopulmonary disease. Electronically Signed   By: Maudie Mercury  Francoise Ceo M.D.   On: 08/30/2019 20:32   US Renal  Result Date: 08/17/2019 CLINICAL DATA:  Acute kidney injury EXAM: RENAL / URINARY TRACT ULTRASOUND COMPLETE COMPARISON:  Ultrasound 08/08/2014, CT abdomen pelvis 07/16/2013 FINDINGS: Right Kidney: Renal measurements: 7.6 x 4.6 x 4.0 cm = volume: 73.6 mL. Normal echogenicity. Few anechoic simple appearing thin walled cysts in the right kidney. Largest in the interpolar kidney measuring 2.4 x 2.2 x 1.5 cm. Smaller cysts measuring up to 1.5 and 1.0 cm are also visualized in the interpolar right kidney. No worrisome renal lesion. No hydronephrosis or shadowing calculus. Left Kidney: Renal measurements: 7.9 x 4.7 x 5.0 cm = volume: 98.3 mL. Normal echogenicity. Few  anechoic simple appearing, thin walled cyst present in the left kidney as well. Largest cyst in the lower pole measuring 2.1 x 2.1 x 1.9 cm. Additional 2.1 and 1.8 cm cysts are noted in the upper and interpolar kidney as well. No worrisome renal mass. No hydronephrosis or shadowing calculus. Bladder: Appears normal for degree of bladder distention. Bilateral bladder jets are identified. Other: Technically difficult exam given limited sonographic windows and bowel gas. IMPRESSION: Technically difficult exam due to limited sonographic windows and extensive bowel gas. Bilateral simple appearing renal cysts are present. Otherwise grossly unremarkable renal ultrasound. Electronically Signed   By: Lovena Le M.D.   On: 08/17/2019 21:25   VAS [place] UPPER EXT VEIN MAPPING (PRE-OP AVF)  Result Date: 08/20/2019 UPPER EXTREMITY VEIN MAPPING  Indications: Pre-access. Comparison Study: No prior exam. Performing Technologist: Baldwin Crown ARDMS, RVT  Examination Guidelines: A complete evaluation includes B-mode imaging, spectral Doppler, color Doppler, and power Doppler as needed of all accessible portions of each vessel. Bilateral testing is considered an integral part of a complete examination. Limited examinations for reoccurring indications may be performed as noted. +-----------------+-------------+----------+----------------+ Right Cephalic   Diameter (cm)Depth (cm)    Findings     +-----------------+-------------+----------+----------------+ Prox upper arm                          Non compressible +-----------------+-------------+----------+----------------+ Mid upper arm                           Non compressible +-----------------+-------------+----------+----------------+ Dist upper arm                          Non compressible +-----------------+-------------+----------+----------------+ Antecubital fossa                       Non compressible  +-----------------+-------------+----------+----------------+ Prox forearm                             not visualized  +-----------------+-------------+----------+----------------+ Mid forearm                              not visualized  +-----------------+-------------+----------+----------------+ Dist forearm                             not visualized  +-----------------+-------------+----------+----------------+ Wrist                                    not visualized  +-----------------+-------------+----------+----------------+ +--------------+-------------+----------+--------------+ Right Basilic Diameter (cm)Depth (cm)  Findings    +--------------+-------------+----------+--------------+ Prox upper arm                       not visualized +--------------+-------------+----------+--------------+ Wrist                                not visualized +--------------+-------------+----------+--------------+ +--------------+-------------+----------+--------------+ Left Cephalic Diameter (cm)Depth (cm)   Findings    +--------------+-------------+----------+--------------+ Prox upper arm                       not visualized +--------------+-------------+----------+--------------+ Wrist                                not visualized +--------------+-------------+----------+--------------+ +--------------+-------------+----------+--------------+ Left Basilic  Diameter (cm)Depth (cm)   Findings    +--------------+-------------+----------+--------------+ Prox upper arm                       not visualized +--------------+-------------+----------+--------------+ Wrist                                not visualized +--------------+-------------+----------+--------------+ Summary: Right: Right cephalic vein non compressible and thrombus visualized        adjacent to IV in antecubital fossa. Right basilic vein not        visualized. Left: Left cephalic and basilic  veins not visualized. *See table(s) above for measurements and observations.  Diagnosing physician: Monica Martinez MD Electronically signed by Monica Martinez MD on 08/20/2019 at 4:32:09 PM.    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   Does have AICD and AVF in place but gram negs don't usually cause endocarditis or device infections so would monitor and if recurs will need work up for device infection. Recommendations Would rec a 14 day total course of levofloxacin orally, renally dosed.  Repeat blood culture ordered Monitor for recurrent bacteremia and if occurs will need TEE to eval AICD and vascular eval of AVF. Thank you very much for allowing me to participate in the care of this patient. I will sign off now but Please call with questions.   Cheral Marker. Ola Spurr, MD

## 2019-09-03 NOTE — Progress Notes (Signed)
[facility]  ROUNDING NOTE   Subjective:   Patient has received three hemodialysis treatments. Tolerated them well. Has been started on PO antibiotics.   Discharge planning for tomorrow.   Objective:  Vital signs in last 24 hours:  Temp:  [98 F (36.7 C)-98.6 F (37 C)] 98 F (36.7 C) (03/19 1154) Pulse Rate:  [70-78] 75 (03/19 1154) Resp:  [16-18] 18 (03/19 1154) BP: (97-112)/(46-74) 112/74 (03/19 1154) SpO2:  [100 %] 100 % (03/19 1154)  Weight change:  Filed Weights   08/30/19 1558 08/30/19 2248 08/31/19 0940  Weight: 76.2 kg 71.2 kg 72 kg    Intake/Output: I/O last 3 completed shifts: In: 390.4 [P.O.:240; I.V.:0.4; IV Piggyback:150] Out: 1800 [Urine:300; Other:1500]   Intake/Output this shift:  No intake/output data recorded.  Physical Exam: General: NAD, lying comfortably in bed   Head: Normocephalic, atraumatic. Moist oral mucosal membranes  Eyes: Anicteric, PERRL  Neck: Supple, trachea midline  Lungs:  Clear to auscultation  Heart: Regular rate and rhythm  Abdomen:  Soft, nontender,   Extremities:  No peripheral edema.  Neurologic: Nonfocal, moving all four extremities  Skin: No lesions  Access: Right AVG     Basic Metabolic Panel: Recent Labs  Lab 08/30/19 1602 09/01/19 0330  NA 138 144  K 5.2* 4.3  CL 104 106  CO2 17* 25  GLUCOSE 156* 82  BUN 142* 98*  CREATININE 6.58* 4.76*  CALCIUM 9.4 8.6*  PHOS  --  5.0*    Liver Function Tests: Recent Labs  Lab 09/01/19 0330  ALBUMIN 3.1*   No results for input(s): LIPASE, AMYLASE in the last 168 hours. No results for input(s): AMMONIA in the last 168 hours.  CBC: Recent Labs  Lab 08/30/19 1602 08/31/19 0919 09/01/19 0330  WBC 18.5* 15.7* 13.6*  HGB 9.3* 8.1* 7.8*  HCT 28.6* 24.1* 23.7*  MCV 85.4 82.0 83.7  PLT 142* 142* 141*    Cardiac Enzymes: No results for input(s): CKTOTAL, CKMB, CKMBINDEX, TROPONINI in the last 168 hours.  BNP: Invalid input(s):  POCBNP  CBG: Recent Labs  Lab 09/02/19 1619 09/02/19 2241 09/03/19 0652 09/03/19 0744 09/03/19 1152  GLUCAP 99 126* 118* 94 40    Microbiology: Results for orders placed or performed during the hospital encounter of 08/30/19  Respiratory Panel by RT PCR (Flu A&B, Covid) - Nasopharyngeal Swab     Status: None   Collection Time: 08/30/19  8:41 PM   Specimen: Nasopharyngeal Swab  Result Value Ref Range Status   SARS Coronavirus 2 by RT PCR NEGATIVE NEGATIVE Final    Comment: (NOTE) SARS-CoV-2 target nucleic acids are NOT DETECTED. The SARS-CoV-2 RNA is generally detectable in upper respiratoy specimens during the acute phase of infection. The lowest concentration of SARS-CoV-2 viral copies this assay can detect is 131 copies/mL. A negative result does not preclude SARS-Cov-2 infection and should not be used as the sole basis for treatment or other patient management decisions. A negative result may occur with  improper specimen collection/handling, submission of specimen other than nasopharyngeal swab, presence of viral mutation(s) within the areas targeted by this assay, and inadequate number of viral copies (<131 copies/mL). A negative result must be combined with clinical observations, patient history, and epidemiological information. The expected result is Negative. Fact Sheet for Patients:  PinkCheek.be Fact Sheet for Healthcare Providers:  GravelBags.it This test is not yet ap proved or cleared by the [place] FDA and  has been authorized for detection and/or diagnosis of SARS-CoV-2 by  FDA under an Emergency Use Authorization (EUA). This EUA will remain  in effect (meaning this test can be used) for the duration of the COVID-19 declaration under Section 564(b)(1) of the Act, 21 U.S.C. section 360bbb-3(b)(1), unless the authorization is terminated or revoked sooner.    Influenza A by PCR NEGATIVE NEGATIVE  Final   Influenza B by PCR NEGATIVE NEGATIVE Final    Comment: (NOTE) The Xpert Xpress SARS-CoV-2/FLU/RSV assay is intended as an aid in  the diagnosis of influenza from Nasopharyngeal swab specimens and  should not be used as a sole basis for treatment. Nasal washings and  aspirates are unacceptable for Xpert Xpress SARS-CoV-2/FLU/RSV  testing. Fact Sheet for Patients: PinkCheek.be Fact Sheet for Healthcare Providers: GravelBags.it This test is not yet approved or cleared by the [place] FDA and  has been authorized for detection and/or diagnosis of SARS-CoV-2 by  FDA under an Emergency Use Authorization (EUA). This EUA will remain  in effect (meaning this test can be used) for the duration of the  Covid-19 declaration under Section 564(b)(1) of the Act, 21  U.S.C. section 360bbb-3(b)(1), unless the authorization is  terminated or revoked. Performed at [facility], [address] [place], [place] [address]   Urine Culture     Status: Abnormal   Collection Time: 08/30/19 11:25 PM   Specimen: Urine, Catheterized  Result Value Ref Range Status   Specimen Description   Final    URINE, CATHETERIZED Performed at [facility], [address] [place], [place] [address]    Special Requests   Final    NONE Performed at [facility], [address] [place], [place] [address]    Culture >=100,000 COLONIES/mL PSEUDOMONAS AERUGINOSA (A)  Final   Report Status 09/03/2019 FINAL  Final   Organism ID, Bacteria PSEUDOMONAS AERUGINOSA (A)  Final      Susceptibility   Pseudomonas aeruginosa - MIC*    CEFTAZIDIME 2 SENSITIVE Sensitive     CIPROFLOXACIN <=0.25 SENSITIVE Sensitive     GENTAMICIN <=1 SENSITIVE Sensitive     IMIPENEM 2 SENSITIVE Sensitive     PIP/TAZO 8 SENSITIVE Sensitive     CEFEPIME 2 SENSITIVE Sensitive     * >=100,000 COLONIES/mL PSEUDOMONAS AERUGINOSA  CULTURE, BLOOD (ROUTINE X  2) w Reflex to ID Panel     Status: None (Preliminary result)   Collection Time: 08/31/19  8:08 AM   Specimen: BLOOD  Result Value Ref Range Status   Specimen Description BLOOD LEFT AC  Final   Special Requests   Final    BOTTLES DRAWN AEROBIC AND ANAEROBIC Blood Culture results may not be optimal due to an excessive volume of blood received in culture bottles   Culture   Final    NO GROWTH 3 DAYS Performed at [facility], [address] [place], [place] [address]    Report Status PENDING  Incomplete  CULTURE, BLOOD (ROUTINE X 2) w Reflex to ID Panel     Status: None (Preliminary result)   Collection Time: 08/31/19  8:13 AM   Specimen: BLOOD  Result Value Ref Range Status   Specimen Description BLOOD LEFT HAND  Final   Special Requests   Final    BOTTLES DRAWN AEROBIC AND ANAEROBIC Blood Culture adequate volume   Culture  Setup Time   Final    GRAM NEGATIVE RODS ANAEROBIC BOTTLE ONLY Organism ID to follow CRITICAL RESULT CALLED TO, READ BACK BY AND VERIFIED WITHHart Robinsons PHARMD [id number] 09/03/19 HNM  Performed at [facility], [address] [place], [place] [address]    Culture PENDING  Incomplete   Report Status PENDING  Incomplete  Blood Culture ID Panel (Reflexed)     Status: Abnormal   Collection Time: 08/31/19  8:13 AM  Result Value Ref Range Status   Enterococcus species NOT DETECTED NOT DETECTED Final   Listeria monocytogenes NOT DETECTED NOT DETECTED Final   Staphylococcus species NOT DETECTED NOT DETECTED Final   Staphylococcus aureus (BCID) NOT DETECTED NOT DETECTED Final   Streptococcus species NOT DETECTED NOT DETECTED Final   Streptococcus agalactiae NOT DETECTED NOT DETECTED Final   Streptococcus pneumoniae NOT DETECTED NOT DETECTED Final   Streptococcus pyogenes NOT DETECTED NOT DETECTED Final   Acinetobacter baumannii NOT DETECTED NOT DETECTED Final   Enterobacteriaceae species NOT DETECTED NOT DETECTED Final   Enterobacter cloacae complex  NOT DETECTED NOT DETECTED Final   Escherichia coli NOT DETECTED NOT DETECTED Final   Klebsiella oxytoca NOT DETECTED NOT DETECTED Final   Klebsiella pneumoniae NOT DETECTED NOT DETECTED Final   Proteus species NOT DETECTED NOT DETECTED Final   Serratia marcescens NOT DETECTED NOT DETECTED Final   Carbapenem resistance NOT DETECTED NOT DETECTED Final   Haemophilus influenzae NOT DETECTED NOT DETECTED Final   Neisseria meningitidis NOT DETECTED NOT DETECTED Final   Pseudomonas aeruginosa DETECTED (A) NOT DETECTED Final    Comment: CRITICAL RESULT CALLED TO, READ BACK BY AND VERIFIED WITH: Hart Robinsons PHARMD [id number] 09/03/19 HNM    Candida albicans NOT DETECTED NOT DETECTED Final   Candida glabrata NOT DETECTED NOT DETECTED Final   Candida krusei NOT DETECTED NOT DETECTED Final   Candida parapsilosis NOT DETECTED NOT DETECTED Final   Candida tropicalis NOT DETECTED NOT DETECTED Final    Comment: Performed at [facility], [address] [place], [place] [address]    Coagulation Studies: No results for input(s): LABPROT, INR in the last 72 hours.  Urinalysis: No results for input(s): COLORURINE, LABSPEC, PHURINE, GLUCOSEU, HGBUR, BILIRUBINUR, KETONESUR, PROTEINUR, UROBILINOGEN, NITRITE, LEUKOCYTESUR in the last 72 hours.  Invalid input(s): APPERANCEUR    Imaging: No results found.   Medications:   . sodium chloride Stopped (09/02/19 0045)  . [START ON 09/04/2019] cefTAZidime (FORTAZ)  IV     . allopurinol  100 mg Oral Daily  . atorvastatin  20 mg Oral Daily  . carvedilol  3.125 mg Oral BID  . Chlorhexidine Gluconate Cloth  6 each Topical Daily  . cholecalciferol  5,000 Units Oral Daily  . feeding supplement (NEPRO CARB STEADY)  237 mL Oral BID BM  . finasteride  5 mg Oral Daily  . heparin  5,000 Units Subcutaneous Q8H  . insulin aspart  0-5 Units Subcutaneous QHS  . insulin aspart  0-6 Units Subcutaneous TID WC  . levothyroxine  25 mcg Oral QAC breakfast  .  multivitamin  1 tablet Oral QHS  . QUEtiapine  12.5 mg Oral QHS  . sertraline  50 mg Oral Daily   sodium chloride, acetaminophen, haloperidol lactate, HYDROcodone-acetaminophen  Assessment/ Plan:  Tyler Hahn is a 79 y.o. black  male with hypertension, diabetes mellitus type II, hypothyroidism, hyperlipidemia, congestive heart failure, AICD,  who was admitted to [facility] on 08/30/2019 for Weakness [R53.1] Elevated BUN [R79.9] ESRD (end stage renal disease) ([place]) [N18.6]   1. End stage renal disease:First dialysis on 3/16. Seen and examined on third dialysis treatment. Plan for  treatment tomorrow as well to transition to MWF schedule.  Using  his AVG with no difficulties.  Outpatient planning for [facility] [address] Patient will be followed by [facility]).  - Next scheduled dialysis treatment to be done in chair.    2. Hypertension: Holding home blood pressure agents of torsemide, lisinopril and amlodipine.  - Continues on carvedilol - lower dose than at home. Hold before dialysis treatment.    3. 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  - EPO with dialysis treatment     4. Secondary Hyperparathyroidism: PTH 233 - at goal. Not currently on binders.  - Discontinue calcitriol. Will reinitiate as outpatient.    5. Diabetes mellitus type II with chronic kidney disease: hemoglobin A1c of 6.8% on 3/1. Well controlled.    LOS: 3 Deshawn Witty 3/19/20212:55 PM

## 2019-09-03 NOTE — Progress Notes (Signed)
Pharmacy Antibiotic Note  Tyler Hahn is a 79 y.o. male admitted on 08/30/2019 with bacteremia and UTI.  Pharmacy has been consulted for ceftazidime dosing.  Plan: Ceftazidime 2 g IV x1 for today - per MD note yesterday, possible d/c today if HD is set up for outpt (pt planned for MWF HD as outpt).  Will order ceftazidime 1 g IV q24h starting tomorrow night if pt is still here - to be given after HD on days pt goes to HD.  Height: 5\' 7"  (170.2 cm) Weight: 158 lb 11.7 oz (72 kg) IBW/kg (Calculated) : 66.1  Temp (24hrs), Avg:98 F (36.7 C), Min:97.5 F (36.4 C), Max:98.6 F (37 C)  Recent Labs  Lab 08/30/19 1602 08/31/19 0919 09/01/19 0330  WBC 18.5* 15.7* 13.6*  CREATININE 6.58*  --  4.76*    Estimated Creatinine Clearance: 12.2 mL/min (A) (by C-G formula based on SCr of 4.76 mg/dL (H)).    Allergies  Allergen Reactions  . Sulfonamide Derivatives Itching and Rash    Antimicrobials this admission: Levaquin 3/17 >>3/19 Ceftazidime 3/19 >>  Dose adjustments this admission:   Microbiology results: 3/16 BCx: 1/2 GNR 3/15 UCx: PsA pansensitive    Thank you for allowing pharmacy to be a part of this patient's care.  [name] 09/03/2019 8:28 AM

## 2019-09-03 NOTE — Progress Notes (Signed)
PHARMACY - PHYSICIAN COMMUNICATION CRITICAL VALUE ALERT - BLOOD CULTURE IDENTIFICATION (BCID)  Tyler Hahn is an 79 y.o. male who presented to [facility] on 08/30/2019 with a chief complaint of fatigue/weakness.  Assessment: Pseudomonas UTI now with Pseudomonas on BCID  Name of physician (or Provider) Contacted: Dr Leslye Peer   Current antibiotics: Levaquin  Changes to prescribed antibiotics recommended:  Recommend change to ceftazidime for administration with outpt HD  Results for orders placed or performed during the hospital encounter of 08/30/19  Blood Culture ID Panel (Reflexed) (Collected: 08/31/2019  8:13 AM)  Result Value Ref Range   Enterococcus species NOT DETECTED NOT DETECTED   Listeria monocytogenes NOT DETECTED NOT DETECTED   Staphylococcus species NOT DETECTED NOT DETECTED   Staphylococcus aureus (BCID) NOT DETECTED NOT DETECTED   Streptococcus species NOT DETECTED NOT DETECTED   Streptococcus agalactiae NOT DETECTED NOT DETECTED   Streptococcus pneumoniae NOT DETECTED NOT DETECTED   Streptococcus pyogenes NOT DETECTED NOT DETECTED   Acinetobacter baumannii NOT DETECTED NOT DETECTED   Enterobacteriaceae species NOT DETECTED NOT DETECTED   Enterobacter cloacae complex NOT DETECTED NOT DETECTED   Escherichia coli NOT DETECTED NOT DETECTED   Klebsiella oxytoca NOT DETECTED NOT DETECTED   Klebsiella pneumoniae NOT DETECTED NOT DETECTED   Proteus species NOT DETECTED NOT DETECTED   Serratia marcescens NOT DETECTED NOT DETECTED   Carbapenem resistance NOT DETECTED NOT DETECTED   Haemophilus influenzae NOT DETECTED NOT DETECTED   Neisseria meningitidis NOT DETECTED NOT DETECTED   Pseudomonas aeruginosa DETECTED (A) NOT DETECTED   Candida albicans NOT DETECTED NOT DETECTED   Candida glabrata NOT DETECTED NOT DETECTED   Candida krusei NOT DETECTED NOT DETECTED   Candida parapsilosis NOT DETECTED NOT DETECTED   Candida tropicalis NOT DETECTED NOT DETECTED    [name] 09/03/2019  8:27 AM

## 2019-09-03 NOTE — Progress Notes (Signed)
There is a change in the clinic choice. Patient's referral is getting rerouted to [place]. Patient and son did not realize that [facility] (the Nephrology group that patient has been followed by) had a dialysis center here in [place]. After bring this to their attention, son decided to switch centers. Hopefully this will not delay discharge. Patient is being processed through [facility] admissions and getting insurance verified.

## 2019-09-03 NOTE — Progress Notes (Signed)
PHARMACY - PHYSICIAN COMMUNICATION CRITICAL VALUE ALERT - BLOOD CULTURE IDENTIFICATION (BCID)  Tyler Hahn is an 79 y.o. male who presented to [facility] on 08/30/2019 with a chief complaint of feeling bad  Assessment:  Lab reports 1 of 4 bottles w/ GNR, pseudomonas aeruginosa  Notified Rayna Sexton, PharmD  Current antibiotics: Levaquin  Changes to prescribed antibiotics recommended: Cefepime + Tobramycin per pharmacy   Results for orders placed or performed during the hospital encounter of 08/30/19  Blood Culture ID Panel (Reflexed) (Collected: 08/31/2019  8:13 AM)  Result Value Ref Range   Enterococcus species NOT DETECTED NOT DETECTED   Listeria monocytogenes NOT DETECTED NOT DETECTED   Staphylococcus species NOT DETECTED NOT DETECTED   Staphylococcus aureus (BCID) NOT DETECTED NOT DETECTED   Streptococcus species NOT DETECTED NOT DETECTED   Streptococcus agalactiae NOT DETECTED NOT DETECTED   Streptococcus pneumoniae NOT DETECTED NOT DETECTED   Streptococcus pyogenes NOT DETECTED NOT DETECTED   Acinetobacter baumannii NOT DETECTED NOT DETECTED   Enterobacteriaceae species NOT DETECTED NOT DETECTED   Enterobacter cloacae complex NOT DETECTED NOT DETECTED   Escherichia coli NOT DETECTED NOT DETECTED   Klebsiella oxytoca NOT DETECTED NOT DETECTED   Klebsiella pneumoniae NOT DETECTED NOT DETECTED   Proteus species NOT DETECTED NOT DETECTED   Serratia marcescens NOT DETECTED NOT DETECTED   Carbapenem resistance NOT DETECTED NOT DETECTED   Haemophilus influenzae NOT DETECTED NOT DETECTED   Neisseria meningitidis NOT DETECTED NOT DETECTED   Pseudomonas aeruginosa DETECTED (A) NOT DETECTED   Candida albicans NOT DETECTED NOT DETECTED   Candida glabrata NOT DETECTED NOT DETECTED   Candida krusei NOT DETECTED NOT DETECTED   Candida parapsilosis NOT DETECTED NOT DETECTED   Candida tropicalis NOT DETECTED NOT DETECTED    Hart Robinsons A 09/03/2019  7:12 AM

## 2019-09-04 LAB — CBC
HCT: 25.2 % — ABNORMAL LOW (ref 39.0–52.0)
Hemoglobin: 8 g/dL — ABNORMAL LOW (ref 13.0–17.0)
MCH: 27.2 pg (ref 26.0–34.0)
MCHC: 31.7 g/dL (ref 30.0–36.0)
MCV: 85.7 fL (ref 80.0–100.0)
Platelets: 149 10*3/uL — ABNORMAL LOW (ref 150–400)
RBC: 2.94 MIL/uL — ABNORMAL LOW (ref 4.22–5.81)
RDW: 14.2 % (ref 11.5–15.5)
WBC: 8.9 10*3/uL (ref 4.0–10.5)
nRBC: 0 % (ref 0.0–0.2)

## 2019-09-04 LAB — GLUCOSE, CAPILLARY
Glucose-Capillary: 83 mg/dL (ref 70–99)
Glucose-Capillary: 98 mg/dL (ref 70–99)
Glucose-Capillary: 74 mg/dL (ref 70–99)
Glucose-Capillary: 98 mg/dL (ref 70–99)

## 2019-09-04 MED ORDER — LEVOFLOXACIN 750 MG PO TABS: ORAL_TABLET | ORAL | 0 refills | Status: AC

## 2019-09-04 MED ORDER — RENA-VITE PO TABS: 1.0000 | ORAL_TABLET | Freq: Every day | ORAL | 0 refills | Status: DC

## 2019-09-04 MED ORDER — NEPRO/CARBSTEADY PO LIQD: 237.0000 mL | Freq: Two times a day (BID) | ORAL | 0 refills | Status: DC

## 2019-09-04 MED ORDER — CARVEDILOL 3.125 MG PO TABS: 3.1250 mg | ORAL_TABLET | Freq: Two times a day (BID) | ORAL | 0 refills | Status: DC

## 2019-09-04 MED ORDER — FINASTERIDE 5 MG PO TABS: 5.0000 mg | ORAL_TABLET | Freq: Every day | ORAL | 0 refills | Status: DC

## 2019-09-04 NOTE — Discharge Summary (Signed)
[facility] NAME: Tyler Hahn    MR#:  [id number]  DATE OF BIRTH:  Nov 21, 1940  DATE OF ADMISSION:  08/30/2019 ADMITTING PHYSICIAN: Christel Mormon, MD  DATE OF DISCHARGE: 09/04/2019  PRIMARY CARE PHYSICIAN: [name], MD    ADMISSION DIAGNOSIS:  Weakness [R53.1] Elevated BUN [R79.9] ESRD (end stage renal disease) ([place]) [N18.6]  DISCHARGE DIAGNOSIS:  Active Problems:   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[place])   Essential hypertension   ESRD (end stage renal disease) ([facility])   Acute lower UTI   Elevated BUN   Weakness   Pseudomonas infection   Urinary tract infection associated with indwelling urethral catheter ([place])   Bacteremia due to Pseudomonas   SECONDARY DIAGNOSIS:   Past Medical History:  Diagnosis Date  . Anxiety   . Automatic implantable cardioverter-defibrillator in situ    greg taylor  . CHF (congestive heart failure) ([place])    2000  . Diabetes mellitus    no meds  . Fatty liver   . Gout    "bout 2-3 months ago"-meds helped.  . Hyperlipidemia   . Hypertension   . Hyperthyroidism   . Kidney cysts   . PONV (postoperative nausea and vomiting)   . Renal insufficiency     HOSPITAL COURSE:   1.  Bacteremia with Pseudomonas.  Pseudomonas urinary tract infection secondary to catheter.  Catheter was discontinued.  The patient initially was on Rocephin but once I got the Pseudomonas culture back I switched over to Levaquin.  Once the blood culture came back positive I switched over to ceftazidime.  Case discussed with infectious disease doctor and okay to switch back to Levaquin for a total of 14 days of antibiotic.  5 more doses of high-dose Levaquin prescribed. 2.  End-stage renal disease.  This is a new start dialysis.  Family switched dialysis centers and now will be on Tuesday Thursday and Saturday.  First dialysis will be on Tuesday as outpatient.  The patient started on  dialysis secondary to uremic symptoms, hyperkalemia and metabolic acidosis.  All have been improved. 3.  Generalized weakness.  Appreciate physical therapy evaluation.  Home health set up. 4.  Type 2 diabetes mellitus with end-stage renal disease.  Hemoglobin A1c 6.8.  Continue diet control as outpatient 5.  Essential hypertension.  Blood pressure on the lower side.  We held back on 2 of his medications and I cut back the Coreg dose. 6.  Hypothyroidism unspecified on levothyroxine 7.  Hyperlipidemia unspecified on atorvastatin 8.  Depression on Zoloft 9.  Confusion here in the hospital.  I did need to give Seroquel at night.  I will not give this at home.  DISCHARGE CONDITIONS:   Satisfactory  CONSULTS OBTAINED:  Treatment Team:  Lavonia Dana, MD Leonel Ramsay, MD  DRUG ALLERGIES:   Allergies  Allergen Reactions  . Sulfonamide Derivatives Itching and Rash    DISCHARGE MEDICATIONS:   Allergies as of 09/04/2019      Reactions   Sulfonamide Derivatives Itching, Rash      Medication List    STOP taking these medications   amLODipine 5 MG tablet Commonly known as: NORVASC   calcitRIOL 0.5 MCG capsule Commonly known as: ROCALTROL   HYDROcodone-acetaminophen 5-325 MG tablet Commonly known as: Norco   lisinopril 20 MG tablet Commonly known as: ZESTRIL   sodium bicarbonate 650 MG tablet   torsemide 20 MG tablet Commonly known as: DEMADEX  TAKE these medications   allopurinol 100 MG tablet Commonly known as: ZYLOPRIM Take 1 tablet (100 mg total) by mouth daily.   atorvastatin 20 MG tablet Commonly known as: LIPITOR Take 1 tablet (20 mg total) by mouth daily.   carvedilol 3.125 MG tablet Commonly known as: COREG Take 1 tablet (3.125 mg total) by mouth 2 (two) times daily. What changed:  medication strength how much to take   feeding supplement (NEPRO CARB STEADY) Liqd Take 237 mLs by mouth 2 (two) times daily between meals.   finasteride 5 MG  tablet Commonly known as: PROSCAR Take 1 tablet (5 mg total) by mouth daily.   levofloxacin 750 MG tablet Commonly known as: Levaquin Take one tablet every other night starting tonight until completed   levothyroxine 25 MCG tablet Commonly known as: SYNTHROID Take 1 tablet (25 mcg total) by mouth daily before breakfast.   multivitamin Tabs tablet Take 1 tablet by mouth at bedtime.   sertraline 50 MG tablet Commonly known as: ZOLOFT Take 1 tablet (50 mg total) by mouth daily.   Vitamin D3 125 MCG (5000 UT) Caps Take 1 capsule by mouth daily.        DISCHARGE INSTRUCTIONS:   Follow-up PMD 5 days Follow-up dialysis Tuesday, Thursday and Saturday  If you experience worsening of your admission symptoms, develop shortness of breath, life threatening emergency, suicidal or homicidal thoughts you must seek medical attention immediately by calling 911 or calling your MD immediately  if symptoms less severe.  You Must read complete instructions/literature along with all the possible adverse reactions/side effects for all the Medicines you take and that have been prescribed to you. Take any new Medicines after you have completely understood and accept all the possible adverse reactions/side effects.   Please note  You were cared for by a hospitalist during your hospital stay. If you have any questions about your discharge medications or the care you received while you were in the hospital after you are discharged, you can call the unit and asked to speak with the hospitalist on call if the hospitalist that took care of you is not available. Once you are discharged, your primary care physician will handle any further medical issues. Please note that NO REFILLS for any discharge medications will be authorized once you are discharged, as it is imperative that you return to your primary care physician (or establish a relationship with a primary care physician if you do not have one) for your  aftercare needs so that they can reassess your need for medications and monitor your lab values.    Today   CHIEF COMPLAINT:   Chief Complaint  Patient presents with  . Fatigue  . Weakness    HISTORY OF PRESENT ILLNESS:  Tyler Hahn  is a 79 y.o. male presented with fatigue and weakness   VITAL SIGNS:  Blood pressure 110/84, pulse 74, temperature 98.6 F (37 C), temperature source Oral, resp. rate 19, height 5\' 7"  (1.702 m), weight 72 kg, SpO2 100 %.   PHYSICAL EXAMINATION:  GENERAL:  79 y.o.-year-old patient lying in the bed with no acute distress.  EYES: Pupils equal, round, reactive to light and accommodation. No scleral icterus. Extraocular muscles intact.  HEENT: Head atraumatic, normocephalic.  LUNGS: Normal breath sounds bilaterally, no wheezing, rales,rhonchi or crepitation. No use of accessory muscles of respiration.  CARDIOVASCULAR: S1, S2 normal. No murmurs, rubs, or gallops.  ABDOMEN: Soft, non-tender, non-distended. Bowel sounds present. No organomegaly or mass.  EXTREMITIES: No  pedal edema, cyanosis, or clubbing.  NEUROLOGIC: Cranial nerves II through XII are intact. Muscle strength 5/5 in all extremities. Sensation intact. Gait not checked.  PSYCHIATRIC: The patient is alert and oriented x 3.  SKIN: No obvious rash, lesion, or ulcer.   DATA REVIEW:   CBC Recent Labs  Lab 09/04/19 0506  WBC 8.9  HGB 8.0*  HCT 25.2*  PLT 149*    Chemistries  Recent Labs  Lab 09/03/19 2011  NA 139  K 3.6  CL 97*  CO2 26  GLUCOSE 142*  BUN 57*  CREATININE 5.12*  CALCIUM 8.6*    Microbiology Results  Results for orders placed or performed during the hospital encounter of 08/30/19  Respiratory Panel by RT PCR (Flu A&B, Covid) - Nasopharyngeal Swab     Status: None   Collection Time: 08/30/19  8:41 PM   Specimen: Nasopharyngeal Swab  Result Value Ref Range Status   SARS Coronavirus 2 by RT PCR NEGATIVE NEGATIVE Final    Comment: (NOTE) SARS-CoV-2 target  nucleic acids are NOT DETECTED. The SARS-CoV-2 RNA is generally detectable in upper respiratoy specimens during the acute phase of infection. The lowest concentration of SARS-CoV-2 viral copies this assay can detect is 131 copies/mL. A negative result does not preclude SARS-Cov-2 infection and should not be used as the sole basis for treatment or other patient management decisions. A negative result may occur with  improper specimen collection/handling, submission of specimen other than nasopharyngeal swab, presence of viral mutation(s) within the areas targeted by this assay, and inadequate number of viral copies (<131 copies/mL). A negative result must be combined with clinical observations, patient history, and epidemiological information. The expected result is Negative. Fact Sheet for Patients:  PinkCheek.be Fact Sheet for Healthcare Providers:  GravelBags.it This test is not yet ap proved or cleared by the [place] FDA and  has been authorized for detection and/or diagnosis of SARS-CoV-2 by FDA under an Emergency Use Authorization (EUA). This EUA will remain  in effect (meaning this test can be used) for the duration of the COVID-19 declaration under Section 564(b)(1) of the Act, 21 U.S.C. section 360bbb-3(b)(1), unless the authorization is terminated or revoked sooner.    Influenza A by PCR NEGATIVE NEGATIVE Final   Influenza B by PCR NEGATIVE NEGATIVE Final    Comment: (NOTE) The Xpert Xpress SARS-CoV-2/FLU/RSV assay is intended as an aid in  the diagnosis of influenza from Nasopharyngeal swab specimens and  should not be used as a sole basis for treatment. Nasal washings and  aspirates are unacceptable for Xpert Xpress SARS-CoV-2/FLU/RSV  testing. Fact Sheet for Patients: PinkCheek.be Fact Sheet for Healthcare Providers: GravelBags.it This test is not  yet approved or cleared by the [place] FDA and  has been authorized for detection and/or diagnosis of SARS-CoV-2 by  FDA under an Emergency Use Authorization (EUA). This EUA will remain  in effect (meaning this test can be used) for the duration of the  Covid-19 declaration under Section 564(b)(1) of the Act, 21  U.S.C. section 360bbb-3(b)(1), unless the authorization is  terminated or revoked. Performed at [facility], [address] [place], [place] [address]   Urine Culture     Status: Abnormal   Collection Time: 08/30/19 11:25 PM   Specimen: Urine, Catheterized  Result Value Ref Range Status   Specimen Description   Final    URINE, CATHETERIZED Performed at [facility], [address] [place], [place] [address]    Special Requests  Final    NONE Performed at [facility], [place], [place] [address]    Culture >=100,000 COLONIES/mL PSEUDOMONAS AERUGINOSA (A)  Final   Report Status 09/03/2019 FINAL  Final   Organism ID, Bacteria PSEUDOMONAS AERUGINOSA (A)  Final      Susceptibility   Pseudomonas aeruginosa - MIC*    CEFTAZIDIME 2 SENSITIVE Sensitive     CIPROFLOXACIN <=0.25 SENSITIVE Sensitive     GENTAMICIN <=1 SENSITIVE Sensitive     IMIPENEM 2 SENSITIVE Sensitive     PIP/TAZO 8 SENSITIVE Sensitive     CEFEPIME 2 SENSITIVE Sensitive     * >=100,000 COLONIES/mL PSEUDOMONAS AERUGINOSA  CULTURE, BLOOD (ROUTINE X 2) w Reflex to ID Panel     Status: None (Preliminary result)   Collection Time: 08/31/19  8:08 AM   Specimen: BLOOD  Result Value Ref Range Status   Specimen Description BLOOD LEFT AC  Final   Special Requests   Final    BOTTLES DRAWN AEROBIC AND ANAEROBIC Blood Culture results may not be optimal due to an excessive volume of blood received in culture bottles   Culture   Final    NO GROWTH 4 DAYS Performed at [facility], [address], [place], [place] [address]    Report Status PENDING   Incomplete  CULTURE, BLOOD (ROUTINE X 2) w Reflex to ID Panel     Status: Abnormal (Preliminary result)   Collection Time: 08/31/19  8:13 AM   Specimen: BLOOD  Result Value Ref Range Status   Specimen Description   Final    BLOOD LEFT HAND Performed at [facility], [address] [place], [place] [address]    Special Requests   Final    BOTTLES DRAWN AEROBIC AND ANAEROBIC Blood Culture adequate volume Performed at [facility], [address] [place], [place] [address]    Culture  Setup Time   Final    GRAM NEGATIVE RODS ANAEROBIC BOTTLE ONLY CRITICAL RESULT CALLED TO, READ BACK BY AND VERIFIED WITH: Hart Robinsons [facility] 2595 09/03/19 HNM    Culture (A)  Final    PSEUDOMONAS AERUGINOSA SUSCEPTIBILITIES TO FOLLOW Performed at [facility], [place] [address], [place],  [address]    Report Status PENDING  Incomplete  Blood Culture ID Panel (Reflexed)     Status: Abnormal   Collection Time: 08/31/19  8:13 AM  Result Value Ref Range Status   Enterococcus species NOT DETECTED NOT DETECTED Final   Listeria monocytogenes NOT DETECTED NOT DETECTED Final   Staphylococcus species NOT DETECTED NOT DETECTED Final   Staphylococcus aureus (BCID) NOT DETECTED NOT DETECTED Final   Streptococcus species NOT DETECTED NOT DETECTED Final   Streptococcus agalactiae NOT DETECTED NOT DETECTED Final   Streptococcus pneumoniae NOT DETECTED NOT DETECTED Final   Streptococcus pyogenes NOT DETECTED NOT DETECTED Final   Acinetobacter baumannii NOT DETECTED NOT DETECTED Final   Enterobacteriaceae species NOT DETECTED NOT DETECTED Final   Enterobacter cloacae complex NOT DETECTED NOT DETECTED Final   Escherichia coli NOT DETECTED NOT DETECTED Final   Klebsiella oxytoca NOT DETECTED NOT DETECTED Final   Klebsiella pneumoniae NOT DETECTED NOT DETECTED Final   Proteus species NOT DETECTED NOT DETECTED Final   Serratia marcescens NOT DETECTED NOT DETECTED Final   Carbapenem  resistance NOT DETECTED NOT DETECTED Final   Haemophilus influenzae NOT DETECTED NOT DETECTED Final   Neisseria meningitidis NOT DETECTED NOT DETECTED Final   Pseudomonas aeruginosa DETECTED (A) NOT DETECTED  Final    Comment: CRITICAL RESULT CALLED TO, READ BACK BY AND VERIFIED WITH: Hart Robinsons PHARMD 2482 09/03/19 HNM    Candida albicans NOT DETECTED NOT DETECTED Final   Candida glabrata NOT DETECTED NOT DETECTED Final   Candida krusei NOT DETECTED NOT DETECTED Final   Candida parapsilosis NOT DETECTED NOT DETECTED Final   Candida tropicalis NOT DETECTED NOT DETECTED Final    Comment: Performed at [facility], [address] [place], [place] [address]  Culture, blood (Routine X 2) w Reflex to ID Panel     Status: None (Preliminary result)   Collection Time: 09/03/19  9:19 AM   Specimen: BLOOD LEFT ARM  Result Value Ref Range Status   Specimen Description BLOOD LEFT ARM  Final   Special Requests   Final    BOTTLES DRAWN AEROBIC AND ANAEROBIC Blood Culture adequate volume   Culture   Final    NO GROWTH < 24 HOURS Performed at [facility], [address] [place], [place] [address]    Report Status PENDING  Incomplete  Culture, blood (Routine X 2) w Reflex to ID Panel     Status: None (Preliminary result)   Collection Time: 09/03/19  9:19 AM   Specimen: BLOOD LEFT HAND  Result Value Ref Range Status   Specimen Description BLOOD LEFT HAND  Final   Special Requests   Final    BOTTLES DRAWN AEROBIC AND ANAEROBIC Blood Culture adequate volume   Culture   Final    NO GROWTH < 24 HOURS Performed at [facility], [address] [place], [place] [address]    Report Status PENDING  Incomplete    Management plans discussed with the patient, family and they are in agreement.  CODE STATUS:     Code Status Orders  (From admission, onward)         Start     Ordered   08/30/19 2121  Full code  Continuous     08/30/19 2130        Code Status  History    Date Active Date Inactive Code Status Order ID Comments User Context   08/17/2019 1957 08/24/2019 2020 Full Code [id number]  Rhetta Mura, DO ED   01/27/2014 1020 01/28/2014 1701 Full Code [id number]  Erroll Luna, MD Inpatient   Advance Care Planning Activity      TOTAL TIME TAKING CARE OF THIS PATIENT: 35 minutes.    Loletha Grayer M.D on 09/04/2019 at 1:35 PM  Between 7am to 6pm - Pager - [phone]  After 6pm go to [email or web address] - password [email or web address]  [facility]  CC: Primary care physician; [name], MD

## 2019-09-04 NOTE — Progress Notes (Signed)
Per Telemetry, pt's HR in 140s.. On assessment, BP 120/68 HR 102.  MD Wieting made aware. Per MD Ok to give Coreg now.

## 2019-09-04 NOTE — Progress Notes (Signed)
Hd started  

## 2019-09-04 NOTE — TOC Transition Note (Signed)
Transition of Care [facility]) - CM/SW Discharge Note   Patient Details  Name: Tyler Hahn MRN: [id number] Date of Birth: Dec 17, 1940  Transition of Care [facility]) CM/SW Contact:  Gelene Mink, [place] Phone Number: 09/04/2019, 10:19 AM   Clinical Narrative:     Patient just arrived to hemodialysis. He will complete a session before he discharges. Patient will discharge home with home health services. [facility] has been made aware.   RN notified to contact patient's son when he is medically ready for discharge. He will provide transportation home.   TOC signing off per the patient discharging home.   Final next level of care: [place] Barriers to Discharge: No Barriers Identified   Patient Goals and CMS Choice Patient states their goals for this hospitalization and ongoing recovery are:: Pt will discharge home with home health CMS Medicare.gov Compare Post Acute Care list provided to:: Patient Choice offered to / list presented to : Patient  Discharge Placement                  Name of family member notified: [name] and Elberta Fortis Patient and family notified of of transfer: 09/04/19  Discharge Plan and Services                  DME Agency: [facility] Date DME Agency Contacted: 09/03/19                Social Determinants of Health ([place]) Interventions     Readmission Risk Interventions Readmission Risk Prevention Plan 09/01/2019  [place] or Home Care Consult Complete  Medication Review (RN Care Manager) Complete  Some recent data might be hidden

## 2019-09-04 NOTE — Progress Notes (Signed)
[facility]  ROUNDING NOTE   Subjective:   Patient seen during dialysis Tolerating well    HEMODIALYSIS FLOWSHEET:  Blood Flow Rate (mL/min): 300 mL/min Arterial Pressure (mmHg): -140 mmHg Venous Pressure (mmHg): 90 mmHg Transmembrane Pressure (mmHg): 70 mmHg Ultrafiltration Rate (mL/min): 510 mL/min Dialysate Flow Rate (mL/min): 600 ml/min Conductivity: Machine : 14 Conductivity: Machine : 14 Dialysis Fluid Bolus: Normal Saline Bolus Amount (mL): 250 mL    Objective:  Vital signs in last 24 hours:  Temp:  [97.6 F (36.4 C)-98.8 F (37.1 C)] 97.7 F (36.5 C) (03/20 1435) Pulse Rate:  [67-92] 67 (03/20 1435) Resp:  [14-22] 16 (03/20 1435) BP: (103-143)/(47-99) 140/56 (03/20 1435) SpO2:  [96 %-100 %] 100 % (03/20 1435)  Weight change:  Filed Weights   08/30/19 1558 08/30/19 2248 08/31/19 0940  Weight: 76.2 kg 71.2 kg 72 kg    Intake/Output: I/O last 3 completed shifts: In: 0  Out: 100 [Urine:100]   Intake/Output this shift:  Total I/O In: 240 [P.O.:240] Out: -   Gen:   Alert, cooperative, no distress, appears stated age, sitting in recliner chair Head:   Normocephalic, without obvious abnormality, atraumatic Eyes/ENT:  Conjunctiva clear,  moist oral mucus membranes Lungs:   Clear to auscultation bilaterally, respirations unlabored Heart:   Regular rhythm, no rub or gallop Abdomen:   Soft, non-tender, Extremities: no cyanosis or edema Skin:  Skin color, texture, turgor normal, no rashes or lesions Neurologic: Alert,oriented to self, some memory gaps, able to answer simple questions  Access: Right AVG     Basic Metabolic Panel: Recent Labs  Lab 08/30/19 1602 09/01/19 0330 09/03/19 2011  NA 138 144 139  K 5.2* 4.3 3.6  CL 104 106 97*  CO2 17* 25 26  GLUCOSE 156* 82 142*  BUN 142* 98* 57*  CREATININE 6.58* 4.76* 5.12*  CALCIUM 9.4 8.6* 8.6*  PHOS  --  5.0* 3.8    Liver Function Tests: Recent Labs  Lab 09/01/19 0330  09/03/19 2011  ALBUMIN 3.1* 3.1*   No results for input(s): LIPASE, AMYLASE in the last 168 hours. No results for input(s): AMMONIA in the last 168 hours.  CBC: Recent Labs  Lab 08/30/19 1602 08/31/19 0919 09/01/19 0330 09/03/19 2011 09/04/19 0506  WBC 18.5* 15.7* 13.6* 9.4 8.9  HGB 9.3* 8.1* 7.8* 7.9* 8.0*  HCT 28.6* 24.1* 23.7* 24.3* 25.2*  MCV 85.4 82.0 83.7 84.4 85.7  PLT 142* 142* 141* 131* 149*    Cardiac Enzymes: No results for input(s): CKTOTAL, CKMB, CKMBINDEX, TROPONINI in the last 168 hours.  BNP: Invalid input(s): POCBNP  CBG: Recent Labs  Lab 09/03/19 1649 09/03/19 2117 09/04/19 0804 09/04/19 1151 09/04/19 1638  GLUCAP 79 112* 83 98 74    Microbiology: Results for orders placed or performed during the hospital encounter of 08/30/19  Respiratory Panel by RT PCR (Flu A&B, Covid) - Nasopharyngeal Swab     Status: None   Collection Time: 08/30/19  8:41 PM   Specimen: Nasopharyngeal Swab  Result Value Ref Range Status   SARS Coronavirus 2 by RT PCR NEGATIVE NEGATIVE Final    Comment: (NOTE) SARS-CoV-2 target nucleic acids are NOT DETECTED. The SARS-CoV-2 RNA is generally detectable in upper respiratoy specimens during the acute phase of infection. The lowest concentration of SARS-CoV-2 viral copies this assay can detect is 131 copies/mL. A negative result does not preclude SARS-Cov-2 infection and should not be used as the sole basis for treatment or other patient management decisions. A negative  result may occur with  improper specimen collection/handling, submission of specimen other than nasopharyngeal swab, presence of viral mutation(s) within the areas targeted by this assay, and inadequate number of viral copies (<131 copies/mL). A negative result must be combined with clinical observations, patient history, and epidemiological information. The expected result is Negative. Fact Sheet for Patients:   PinkCheek.be Fact Sheet for Healthcare Providers:  GravelBags.it This test is not yet ap proved or cleared by the [place] FDA and  has been authorized for detection and/or diagnosis of SARS-CoV-2 by FDA under an Emergency Use Authorization (EUA). This EUA will remain  in effect (meaning this test can be used) for the duration of the COVID-19 declaration under Section 564(b)(1) of the Act, 21 U.S.C. section 360bbb-3(b)(1), unless the authorization is terminated or revoked sooner.    Influenza A by PCR NEGATIVE NEGATIVE Final   Influenza B by PCR NEGATIVE NEGATIVE Final    Comment: (NOTE) The Xpert Xpress SARS-CoV-2/FLU/RSV assay is intended as an aid in  the diagnosis of influenza from Nasopharyngeal swab specimens and  should not be used as a sole basis for treatment. Nasal washings and  aspirates are unacceptable for Xpert Xpress SARS-CoV-2/FLU/RSV  testing. Fact Sheet for Patients: PinkCheek.be Fact Sheet for Healthcare Providers: GravelBags.it This test is not yet approved or cleared by the [place] FDA and  has been authorized for detection and/or diagnosis of SARS-CoV-2 by  FDA under an Emergency Use Authorization (EUA). This EUA will remain  in effect (meaning this test can be used) for the duration of the  Covid-19 declaration under Section 564(b)(1) of the Act, 21  U.S.C. section 360bbb-3(b)(1), unless the authorization is  terminated or revoked. Performed at [facility], [address] [place], [place] [address]   Urine Culture     Status: Abnormal   Collection Time: 08/30/19 11:25 PM   Specimen: Urine, Catheterized  Result Value Ref Range Status   Specimen Description   Final    URINE, CATHETERIZED Performed at [facility], [address] [place], [place] [address]    Special Requests   Final    NONE Performed at  [facility], [address] [place], [place] [address]    Culture >=100,000 COLONIES/mL PSEUDOMONAS AERUGINOSA (A)  Final   Report Status 09/03/2019 FINAL  Final   Organism ID, Bacteria PSEUDOMONAS AERUGINOSA (A)  Final      Susceptibility   Pseudomonas aeruginosa - MIC*    CEFTAZIDIME 2 SENSITIVE Sensitive     CIPROFLOXACIN <=0.25 SENSITIVE Sensitive     GENTAMICIN <=1 SENSITIVE Sensitive     IMIPENEM 2 SENSITIVE Sensitive     PIP/TAZO 8 SENSITIVE Sensitive     CEFEPIME 2 SENSITIVE Sensitive     * >=100,000 COLONIES/mL PSEUDOMONAS AERUGINOSA  CULTURE, BLOOD (ROUTINE X 2) w Reflex to ID Panel     Status: None (Preliminary result)   Collection Time: 08/31/19  8:08 AM   Specimen: BLOOD  Result Value Ref Range Status   Specimen Description BLOOD LEFT AC  Final   Special Requests   Final    BOTTLES DRAWN AEROBIC AND ANAEROBIC Blood Culture results may not be optimal due to an excessive volume of blood received in culture bottles   Culture   Final    NO GROWTH 4 DAYS Performed at [facility], [address] [place], [place] [address]    Report Status PENDING  Incomplete  CULTURE, BLOOD (ROUTINE X 2) w Reflex to ID Panel  Status: Abnormal (Preliminary result)   Collection Time: 08/31/19  8:13 AM   Specimen: BLOOD  Result Value Ref Range Status   Specimen Description   Final    BLOOD LEFT HAND Performed at [facility], [address], [place], [place] [address]    Special Requests   Final    BOTTLES DRAWN AEROBIC AND ANAEROBIC Blood Culture adequate volume Performed at [facility], [address] [place], [place] [address]    Culture  Setup Time   Final    GRAM NEGATIVE RODS ANAEROBIC BOTTLE ONLY CRITICAL RESULT CALLED TO, READ BACK BY AND VERIFIED WITH: Hart Robinsons [facility] 7510 09/03/19 HNM    Culture (A)  Final    PSEUDOMONAS AERUGINOSA SUSCEPTIBILITIES TO FOLLOW Performed at [facility], [place] [address], [place],  [place] [address]    Report Status PENDING  Incomplete  Blood Culture ID Panel (Reflexed)     Status: Abnormal   Collection Time: 08/31/19  8:13 AM  Result Value Ref Range Status   Enterococcus species NOT DETECTED NOT DETECTED Final   Listeria monocytogenes NOT DETECTED NOT DETECTED Final   Staphylococcus species NOT DETECTED NOT DETECTED Final   Staphylococcus aureus (BCID) NOT DETECTED NOT DETECTED Final   Streptococcus species NOT DETECTED NOT DETECTED Final   Streptococcus agalactiae NOT DETECTED NOT DETECTED Final   Streptococcus pneumoniae NOT DETECTED NOT DETECTED Final   Streptococcus pyogenes NOT DETECTED NOT DETECTED Final   Acinetobacter baumannii NOT DETECTED NOT DETECTED Final   Enterobacteriaceae species NOT DETECTED NOT DETECTED Final   Enterobacter cloacae complex NOT DETECTED NOT DETECTED Final   Escherichia coli NOT DETECTED NOT DETECTED Final   Klebsiella oxytoca NOT DETECTED NOT DETECTED Final   Klebsiella pneumoniae NOT DETECTED NOT DETECTED Final   Proteus species NOT DETECTED NOT DETECTED Final   Serratia marcescens NOT DETECTED NOT DETECTED Final   Carbapenem resistance NOT DETECTED NOT DETECTED Final   Haemophilus influenzae NOT DETECTED NOT DETECTED Final   Neisseria meningitidis NOT DETECTED NOT DETECTED Final   Pseudomonas aeruginosa DETECTED (A) NOT DETECTED Final    Comment: CRITICAL RESULT CALLED TO, READ BACK BY AND VERIFIED WITH: Hart Robinsons [facility] [id number] 09/03/19 HNM    Candida albicans NOT DETECTED NOT DETECTED Final   Candida glabrata NOT DETECTED NOT DETECTED Final   Candida krusei NOT DETECTED NOT DETECTED Final   Candida parapsilosis NOT DETECTED NOT DETECTED Final   Candida tropicalis NOT DETECTED NOT DETECTED Final    Comment: Performed at [facility], [address] [place], [place] [address]  Culture, blood (Routine X 2) w Reflex to ID Panel     Status: None (Preliminary result)   Collection Time: 09/03/19  9:19 AM   Specimen: BLOOD LEFT  ARM  Result Value Ref Range Status   Specimen Description BLOOD LEFT ARM  Final   Special Requests   Final    BOTTLES DRAWN AEROBIC AND ANAEROBIC Blood Culture adequate volume   Culture   Final    NO GROWTH < 24 HOURS Performed at [facility], [address] [place], [place] [address]    Report Status PENDING  Incomplete  Culture, blood (Routine X 2) w Reflex to ID Panel     Status: None (Preliminary result)   Collection Time: 09/03/19  9:19 AM   Specimen: BLOOD LEFT HAND  Result Value Ref Range Status   Specimen Description BLOOD LEFT HAND  Final   Special Requests   Final  BOTTLES DRAWN AEROBIC AND ANAEROBIC Blood Culture adequate volume   Culture   Final    NO GROWTH < 24 HOURS Performed at [facility], [address] [place], [place] [address]    Report Status PENDING  Incomplete    Coagulation Studies: No results for input(s): LABPROT, INR in the last 72 hours.  Urinalysis: No results for input(s): COLORURINE, LABSPEC, PHURINE, GLUCOSEU, HGBUR, BILIRUBINUR, KETONESUR, PROTEINUR, UROBILINOGEN, NITRITE, LEUKOCYTESUR in the last 72 hours.  Invalid input(s): APPERANCEUR    Imaging: No results found.   Medications:   . sodium chloride Stopped (09/02/19 0045)  . cefTAZidime (FORTAZ)  IV     . allopurinol  100 mg Oral Daily  . atorvastatin  20 mg Oral Daily  . carvedilol  3.125 mg Oral BID  . Chlorhexidine Gluconate Cloth  6 each Topical Daily  . cholecalciferol  5,000 Units Oral Daily  . feeding supplement (NEPRO CARB STEADY)  237 mL Oral BID BM  . finasteride  5 mg Oral Daily  . heparin  5,000 Units Subcutaneous Q8H  . insulin aspart  0-5 Units Subcutaneous QHS  . insulin aspart  0-6 Units Subcutaneous TID WC  . levothyroxine  25 mcg Oral QAC breakfast  . multivitamin  1 tablet Oral QHS  . QUEtiapine  12.5 mg Oral QHS  . sertraline  50 mg Oral Daily   sodium chloride, acetaminophen, haloperidol lactate,  HYDROcodone-acetaminophen  Assessment/ Plan:  [name] is a 79 y.o. black  male with hypertension, diabetes mellitus type II, hypothyroidism, hyperlipidemia, congestive heart failure, AICD,  who was admitted to [facility] on 08/30/2019 for Weakness [R53.1] Elevated BUN [R79.9] ESRD (end stage renal disease) ([place]) [N18.6]   #. End stage renal disease:First dialysis on 3/16.    Using his AVG with no difficulties.  Outpatient planning for [facility] [address] Patient will be followed by [facility]).   As per discharge planning notes, patient will start outpatient HD on Tuesday Tolerated dialysis today seated in chair   #. Secondary Hyperparathyroidism: Lab Results  Component Value Date   PTH 233 (H) 08/31/2019   CALCIUM 8.6 (L) 09/03/2019   PHOS 3.8 09/03/2019  Currently not on binders  #Bacteremia with Pseudomonas UTI Deemed to be secondary to indwelling catheter  Patient will be treated with oral Levaquin.  Dose assisted by pharmacist.    LOS: 4 Tamasha Laplante Candiss Norse 3/20/20214:45 PM

## 2019-09-04 NOTE — Accreditation Note (Signed)
Patient appears to have increase confusion since this am. Spoke with pt's son and he stated that some confusion is normal for his dad. MD Wieting made aware. Pt waiting on son to pick him up.

## 2019-09-04 NOTE — Progress Notes (Signed)
Discharge instructions and medication details reviewed with patient's son Corky Sing. He verbalized understanding. All questions answered. Printed AVS given to son. IV removed w/o complications. Patient's belongings packed. Pt escorted out via wheelchair and helped into car.   Wynema Birch, RN

## 2019-09-05 LAB — CULTURE, BLOOD (ROUTINE X 2)
Culture: NO GROWTH
Special Requests: ADEQUATE

## 2019-09-07 ENCOUNTER — Telehealth: Payer: Medicare HMO | Admitting: Physician Assistant

## 2019-09-07 ENCOUNTER — Telehealth: Payer: Self-pay | Admitting: Emergency Medicine

## 2019-09-07 NOTE — Telephone Encounter (Signed)
Patient Name: Tyler Hahn Gender: Male DOB: [birth year] Age: 79 Y 70 M 10 D Return Phone Number: [phone] (Primary), [phone] (Secondary) Address: City/State/Zip: [place] [address] Client [facility] [place] [facility] - Cli Client Site [place] Physician Dimple Nanas- MD Contact Type Call Who Is Calling Patient / Member / Family / Caregiver Call Type Triage / Clinical Caller Name Odilon Cass Relationship To Patient Son Return Phone Number [phone] (Primary) Chief Complaint Vomiting Reason for Call Symptomatic / Request for [place] states his father has vomiting and diarrhea just started this morning - he did just start dialysis and has end stage renal disease. Office would like for patient to be triaged. Translation No Nurse Assessment Nurse: Juleen China, RN, Butch Penny Date/Time Eilene Ghazi Time): 09/07/2019 9:35:57 AM Confirm and document reason for call. If symptomatic, describe symptoms. ---Caller states his father has vomiting and diarrhea just started this morning. He ahs end stage renal disease has started dialysis. [facility] Saturday. No fever, cough. Has the patient had close contact with a person known or suspected to have the novel coronavirus illness OR traveled / lives in area with major community spread (including international travel) in the last 14 days from the onset of symptoms? * If Asymptomatic, screen for exposure and travel within the last 14 days. ---No Does the patient have any new or worsening symptoms? ---Yes Will a triage be completed? ---Yes Related visit to physician within the last 2 weeks? ---Yes Does the PT have any chronic conditions? (i.e. diabetes, asthma, this includes High risk factors for pregnancy, etc.) ---Yes List chronic conditions. ---ESRD Diabetes Is this a behavioral health or substance abuse  call? ---No Guidelines Guideline Title Affirmed Question Affirmed Notes Nurse Date/Time Eilene Ghazi Time) Vomiting [1] MODERATE vomiting (e.g., 3 - 5 Juleen China, RN, Butch Penny 09/07/2019 9:39:37 AM PLEASE NOTE: All timestamps contained within this report are represented as [place] Standard Time. CONFIDENTIALTY NOTICE: This fax transmission is intended only for the addressee. It contains information that is legally privileged, confidential or otherwise protected from use or disclosure. If you are not the intended recipient, you are strictly prohibited from reviewing, disclosing, copying using or disseminating any of this information or taking any action in reliance on or regarding this information. If you have received this fax in error, please notify us immediately by telephone so that we can arrange for its return to [place]. Phone: [phone], Toll-Free: [phone], Fax: [phone] Page: 2 of 2 Call Id: [id number] Guidelines Guideline Title Affirmed Question Affirmed Notes Nurse Date/Time Eilene Ghazi Time) times/day) AND [2] age > 43 Disp. Time Eilene Ghazi Time) Disposition Final User 09/07/2019 9:45:59 AM Go to ED Now (or PCP triage) Yes Juleen China, RN, [facility] Disagree/Comply Comply Caller Understands Yes PreDisposition Call Doctor Care Advice Given Per Guideline GO TO ED NOW (OR PCP TRIAGE): * IF NO PCP (PRIMARY CARE PROVIDER) SECOND-LEVEL TRIAGE: You need to be seen within the next hour. Go to the [facility] at _____________ [place] as soon as you can. BRING A BUCKET IN CASE OF VOMITING : * You may wish to bring a bucket, pan, or sack with you in case there is more vomiting during the drive. BRING MEDICINES: * Please bring a list of your current medicines when you go to see the doctor. * It is also a good idea to bring the pill bottles too. This will help the doctor to make certain you are taking the right medicines  and the right dose. CARE ADVICE per Vomiting (Adult)  guideline. Comments User: Jennye Moccasin, RN Date/Time Eilene Ghazi Time): 09/07/2019 9:35:36 AM Father gave verbal permission to speak with Elberta Fortis regarding his health care. User: Jennye Moccasin, RN Date/Time Eilene Ghazi Time): 09/07/2019 9:48:59 AM Dialysis shunt is not red or swollen. User: Jennye Moccasin, RN Date/Time Eilene Ghazi Time): 09/07/2019 9:50:48 AM Attempted to contact back line and and no answer went to busy signal. User: Jennye Moccasin, RN Date/Time Eilene Ghazi Time): 09/07/2019 9:51:39 AM No diarrhea. User: Jennye Moccasin, RN Date/Time Eilene Ghazi Time): 09/07/2019 9:54:48 AM Caller is going to contact patient's nephrologist now before going to ER or UC. User: Jennye Moccasin, RN Date/Time Eilene Ghazi Time): 09/07/2019 9:55:27 AM Long wait time to speak to office. Referrals GO TO FACILITY UNDECIDED

## 2019-09-07 NOTE — Telephone Encounter (Signed)
Spoke with patient son Elberta Fortis at his appointment time scheduled today.  Patient was not headed to the ER for evaluation. Patient son states his vomiting and diarrhea has stopped.  He was eating and drinking and able to keep everything down. He took his medications and was feeling better. No more diarrhea.  Patient rescheduled for his Dialysis on tomorrow 09/08/19 and scheduled for Hospital f/u with PCP on Friday 09/10/19

## 2019-09-08 DIAGNOSIS — R509 Fever, unspecified: Secondary | ICD-10-CM | POA: Diagnosis not present

## 2019-09-08 DIAGNOSIS — N390 Urinary tract infection, site not specified: Secondary | ICD-10-CM | POA: Diagnosis not present

## 2019-09-08 DIAGNOSIS — I132 Hypertensive heart and chronic kidney disease with heart failure and with stage 5 chronic kidney disease, or end stage renal disease: Secondary | ICD-10-CM | POA: Diagnosis not present

## 2019-09-08 DIAGNOSIS — E1129 Type 2 diabetes mellitus with other diabetic kidney complication: Secondary | ICD-10-CM | POA: Diagnosis not present

## 2019-09-08 DIAGNOSIS — Z992 Dependence on renal dialysis: Secondary | ICD-10-CM | POA: Diagnosis not present

## 2019-09-08 DIAGNOSIS — N2581 Secondary hyperparathyroidism of renal origin: Secondary | ICD-10-CM | POA: Diagnosis not present

## 2019-09-08 DIAGNOSIS — D509 Iron deficiency anemia, unspecified: Secondary | ICD-10-CM | POA: Diagnosis not present

## 2019-09-08 DIAGNOSIS — I5022 Chronic systolic (congestive) heart failure: Secondary | ICD-10-CM | POA: Diagnosis not present

## 2019-09-08 DIAGNOSIS — T83511A Infection and inflammatory reaction due to indwelling urethral catheter, initial encounter: Secondary | ICD-10-CM | POA: Diagnosis not present

## 2019-09-08 DIAGNOSIS — E1122 Type 2 diabetes mellitus with diabetic chronic kidney disease: Secondary | ICD-10-CM | POA: Diagnosis not present

## 2019-09-08 DIAGNOSIS — D689 Coagulation defect, unspecified: Secondary | ICD-10-CM | POA: Diagnosis not present

## 2019-09-08 DIAGNOSIS — K760 Fatty (change of) liver, not elsewhere classified: Secondary | ICD-10-CM | POA: Diagnosis not present

## 2019-09-08 DIAGNOSIS — N186 End stage renal disease: Secondary | ICD-10-CM | POA: Diagnosis not present

## 2019-09-08 DIAGNOSIS — B965 Pseudomonas (aeruginosa) (mallei) (pseudomallei) as the cause of diseases classified elsewhere: Secondary | ICD-10-CM | POA: Diagnosis not present

## 2019-09-08 DIAGNOSIS — D631 Anemia in chronic kidney disease: Secondary | ICD-10-CM | POA: Diagnosis not present

## 2019-09-08 DIAGNOSIS — M109 Gout, unspecified: Secondary | ICD-10-CM | POA: Diagnosis not present

## 2019-09-08 LAB — CULTURE, BLOOD (ROUTINE X 2)
Culture: NO GROWTH
Culture: NO GROWTH
Special Requests: ADEQUATE
Special Requests: ADEQUATE

## 2019-09-09 ENCOUNTER — Telehealth: Payer: Self-pay | Admitting: Family Medicine

## 2019-09-09 NOTE — Telephone Encounter (Signed)
I have placed a HH cert. And plan of care in the bin upfront with a charge sheet.

## 2019-09-10 ENCOUNTER — Encounter: Payer: Self-pay | Admitting: Family Medicine

## 2019-09-10 ENCOUNTER — Other Ambulatory Visit: Payer: Self-pay

## 2019-09-10 ENCOUNTER — Telehealth (INDEPENDENT_AMBULATORY_CARE_PROVIDER_SITE_OTHER): Payer: Medicare HMO | Admitting: Family Medicine

## 2019-09-10 DIAGNOSIS — R7881 Bacteremia: Secondary | ICD-10-CM

## 2019-09-10 DIAGNOSIS — M109 Gout, unspecified: Secondary | ICD-10-CM

## 2019-09-10 DIAGNOSIS — Z95810 Presence of automatic (implantable) cardiac defibrillator: Secondary | ICD-10-CM

## 2019-09-10 DIAGNOSIS — T83511A Infection and inflammatory reaction due to indwelling urethral catheter, initial encounter: Secondary | ICD-10-CM

## 2019-09-10 DIAGNOSIS — N186 End stage renal disease: Secondary | ICD-10-CM

## 2019-09-10 DIAGNOSIS — E039 Hypothyroidism, unspecified: Secondary | ICD-10-CM

## 2019-09-10 DIAGNOSIS — B965 Pseudomonas (aeruginosa) (mallei) (pseudomallei) as the cause of diseases classified elsewhere: Secondary | ICD-10-CM

## 2019-09-10 DIAGNOSIS — F329 Major depressive disorder, single episode, unspecified: Secondary | ICD-10-CM

## 2019-09-10 DIAGNOSIS — Z792 Long term (current) use of antibiotics: Secondary | ICD-10-CM

## 2019-09-10 DIAGNOSIS — N390 Urinary tract infection, site not specified: Secondary | ICD-10-CM | POA: Diagnosis not present

## 2019-09-10 DIAGNOSIS — N3000 Acute cystitis without hematuria: Secondary | ICD-10-CM

## 2019-09-10 DIAGNOSIS — K760 Fatty (change of) liver, not elsewhere classified: Secondary | ICD-10-CM

## 2019-09-10 DIAGNOSIS — F419 Anxiety disorder, unspecified: Secondary | ICD-10-CM

## 2019-09-10 DIAGNOSIS — Z9181 History of falling: Secondary | ICD-10-CM

## 2019-09-10 DIAGNOSIS — I5022 Chronic systolic (congestive) heart failure: Secondary | ICD-10-CM | POA: Diagnosis not present

## 2019-09-10 DIAGNOSIS — I132 Hypertensive heart and chronic kidney disease with heart failure and with stage 5 chronic kidney disease, or end stage renal disease: Secondary | ICD-10-CM | POA: Diagnosis not present

## 2019-09-10 DIAGNOSIS — D631 Anemia in chronic kidney disease: Secondary | ICD-10-CM

## 2019-09-10 DIAGNOSIS — M10371 Gout due to renal impairment, right ankle and foot: Secondary | ICD-10-CM | POA: Diagnosis not present

## 2019-09-10 DIAGNOSIS — R531 Weakness: Secondary | ICD-10-CM | POA: Diagnosis not present

## 2019-09-10 DIAGNOSIS — Z992 Dependence on renal dialysis: Secondary | ICD-10-CM

## 2019-09-10 DIAGNOSIS — A498 Other bacterial infections of unspecified site: Secondary | ICD-10-CM | POA: Diagnosis not present

## 2019-09-10 DIAGNOSIS — E1122 Type 2 diabetes mellitus with diabetic chronic kidney disease: Secondary | ICD-10-CM | POA: Diagnosis not present

## 2019-09-10 DIAGNOSIS — I429 Cardiomyopathy, unspecified: Secondary | ICD-10-CM

## 2019-09-10 DIAGNOSIS — Z87891 Personal history of nicotine dependence: Secondary | ICD-10-CM

## 2019-09-10 DIAGNOSIS — E785 Hyperlipidemia, unspecified: Secondary | ICD-10-CM

## 2019-09-10 NOTE — Progress Notes (Signed)
I have discussed the procedure for the virtual visit with the patient who has given consent to proceed with assessment and treatment.   Pt unable to obtain vitals.   Thor Nannini L Kee Drudge, CMA     

## 2019-09-10 NOTE — Telephone Encounter (Signed)
Paperwork given to PCP.  

## 2019-09-10 NOTE — Telephone Encounter (Signed)
Form completed and placed in basket  

## 2019-09-10 NOTE — Assessment & Plan Note (Signed)
Ongoing issue.  Pt appears frail today.  Has home health PT coming out to assist.  Encouraged him to talk w/ HD about his fatigue, weakness, and dizziness after treatment as they may need to make adjustments.  Son expressed understanding.

## 2019-09-10 NOTE — Assessment & Plan Note (Signed)
Pt is now on Tues/Thurs/Sat HD.  Encouraged pt and family to communicate w/ providers at dialysis center as it may take some adjustments to get his treatments to where he feels ok.  Pt expressed understanding and is in agreement w/ plan.

## 2019-09-10 NOTE — Assessment & Plan Note (Signed)
Pt completed course of abx and denies current sxs of infxn- no fever, chills.  Had 1 episode of vomiting on Tuesday AM but none since.

## 2019-09-10 NOTE — Telephone Encounter (Signed)
Patina picked up forms, faxed them, and sent to scan.

## 2019-09-10 NOTE — Assessment & Plan Note (Signed)
New.  R ankle started hurting 1-2 days ago.  'I know it's gout'.  Encouraged him to discuss this at HD tomorrow as they will be able to treat.  This was likely precipitated by HD in the first place.

## 2019-09-10 NOTE — Telephone Encounter (Signed)
FYI

## 2019-09-10 NOTE — Progress Notes (Signed)
Virtual Visit via Video   I connected with patient on 09/10/19 at 11:00 AM EDT by a video enabled telemedicine application and verified that I am speaking with the correct person using two identifiers.  Location patient: Home Location provider: Acupuncturist, Office Persons participating in the virtual visit: Patient, Provider, [place] (Jess B)  I discussed the limitations of evaluation and management by telemedicine and the availability of in person appointments. The patient expressed understanding and agreed to proceed.  Subjective:   HPI:   Hospital f/u- pt was admitted 3/15-3/20 after being admitted 3/2-3/9 w/ ARF.  During his 2nd admission he was started on HD due to elevated BUN and weakness.  During his hospitalization he developed a Pseudomonas UTI from his catheter and subsequently bacteremia.  He completed a total of 14 days of abx.  He is now on outpt HD T/Th/Sat in order to manage his uremic sxs and electrolyte imbalance.  Due to his generalized weakness he will have [facility].  During his hospitalization BP was running low so Coreg was decreased to 3.125mg  BID and Lisinopril and Amlodipine were d/c'd.  Had 1 episode of vomiting on Tuesday but none since.  Pt reports feeling better today.  Son reports that pt gets 'dizzy' when he's at HD.  Also has some dizziness when up and moving around.  This AM he had not had anything to eat or drink when he felt dizzy.  Pt and son report that things are moving in the right direction.  He is now able to remember to take his medication and feels less unsteady.  'I feel better'.  Having gout flare of R ankle- sxs started in the last 1-2 days.  ROS:   See pertinent positives and negatives per HPI.  Patient Active Problem List   Diagnosis Date Noted  . Bacteremia due to Pseudomonas   . Pseudomonas infection   . Urinary tract infection associated with indwelling urethral catheter ([place])   . Acute lower UTI   . Elevated BUN   . Weakness   .  ESRD (end stage renal disease) ([place]) 08/30/2019  . Acute renal failure (ARF) ([place]) 08/18/2019  . History of anemia due to CKD 08/18/2019  . Hyperkalemia 08/17/2019  . Hypothyroid 08/16/2019  . Physical exam 02/18/2019  . Greater trochanteric bursitis of left hip 08/04/2018  . Osteoarthritis 09/25/2016  . Screen for colon cancer   . Benign neoplasm of transverse colon   . Edema 07/28/2014  . Elevated LFTs 07/28/2014  . Adjustment disorder with depressed mood 03/24/2014  . Epigastric pain 01/27/2014  . Small bowel mass 12/20/2013  . Abdominal pain, left upper quadrant 11/25/2013  . Myalgia 07/07/2013  . Loss of weight 04/19/2013  . Polyarthralgia 04/19/2013  . Loss of appetite 04/19/2013  . 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 Right elbow pain 08/17/2012  . 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 SYSTOLIC HEART FAILURE, CHRONIC 06/07/2010  . Chronic kidney disease (CKD), stage IV (severe) ([place]) 04/04/2010  . TESTICULAR MASS 04/04/2010  . 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[place]) 03/07/2010  . Hyperlipidemia 03/07/2010  . GOUT 03/07/2010  . Essential hypertension 03/07/2010  . 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 Smoking status: Former Smoker    Types: Cigarettes    Quit date: 11/26/1998    Years since quitting: 20.8  . Smokeless tobacco: Never Used  Substance Use Topics  . Alcohol use: No  Alcohol/week: 0.0 standard drinks    Current Outpatient Medications:  .  allopurinol (ZYLOPRIM) 100 MG tablet, Take 1 tablet (100 mg total) by mouth daily., Disp: 90 tablet, Rfl: 1 .  atorvastatin (LIPITOR) 20 MG tablet, Take 1 tablet (20 mg total) by mouth daily., Disp: 90 tablet, Rfl: 1 .  carvedilol (COREG) 3.125 MG tablet, Take 1 tablet (3.125 mg total) by mouth 2 (two) times daily., Disp: 60 tablet, Rfl: 0 .  Cholecalciferol (VITAMIN D3) 5000 units CAPS, Take 1 capsule by mouth  daily., Disp: , Rfl:  .  finasteride (PROSCAR) 5 MG tablet, Take 1 tablet (5 mg total) by mouth daily., Disp: 30 tablet, Rfl: 0 .  levofloxacin (LEVAQUIN) 750 MG tablet, Take one tablet every other night starting tonight until completed, Disp: 5 tablet, Rfl: 0 .  levothyroxine (SYNTHROID) 25 MCG tablet, Take 1 tablet (25 mcg total) by mouth daily before breakfast., Disp: 90 tablet, Rfl: 1 .  multivitamin (RENA-VIT) TABS tablet, Take 1 tablet by mouth at bedtime., Disp: 30 tablet, Rfl: 0 .  Nutritional Supplements (FEEDING SUPPLEMENT, NEPRO CARB STEADY,) LIQD, Take 237 mLs by mouth 2 (two) times daily between meals., Disp: 14220 mL, Rfl: 0 .  sertraline (ZOLOFT) 50 MG tablet, Take 1 tablet (50 mg total) by mouth daily., Disp: 90 tablet, Rfl: 1  Allergies  Allergen Reactions  . Sulfonamide Derivatives Itching and Rash    Objective:   There were no vitals taken for this visit.  AAOx3, NAD Frail, elderly man NCAT, EOMI No obvious CN deficits Coloring WNL Pt is able to speak clearly, coherently without shortness of breath or increased work of breathing.  Thought process is linear but slowed.  Mood is appropriate.   Assessment and Plan:   See Problem Based Charting   [name], MD 09/10/2019

## 2019-09-11 ENCOUNTER — Emergency Department: Payer: Medicare HMO

## 2019-09-11 ENCOUNTER — Emergency Department
Admission: EM | Admit: 2019-09-11 | Discharge: 2019-09-11 | Disposition: A | Discharge status: Home / Self Care | Payer: Medicare HMO | Attending: Emergency Medicine | Admitting: Emergency Medicine

## 2019-09-11 ENCOUNTER — Other Ambulatory Visit: Payer: Self-pay

## 2019-09-11 DIAGNOSIS — R251 Tremor, unspecified: Secondary | ICD-10-CM | POA: Diagnosis present

## 2019-09-11 DIAGNOSIS — R42 Dizziness and giddiness: Secondary | ICD-10-CM | POA: Diagnosis not present

## 2019-09-11 DIAGNOSIS — Z794 Long term (current) use of insulin: Secondary | ICD-10-CM | POA: Diagnosis not present

## 2019-09-11 DIAGNOSIS — I509 Heart failure, unspecified: Secondary | ICD-10-CM | POA: Insufficient documentation

## 2019-09-11 DIAGNOSIS — Z992 Dependence on renal dialysis: Secondary | ICD-10-CM | POA: Diagnosis not present

## 2019-09-11 DIAGNOSIS — I132 Hypertensive heart and chronic kidney disease with heart failure and with stage 5 chronic kidney disease, or end stage renal disease: Secondary | ICD-10-CM | POA: Insufficient documentation

## 2019-09-11 DIAGNOSIS — E119 Type 2 diabetes mellitus without complications: Secondary | ICD-10-CM | POA: Insufficient documentation

## 2019-09-11 DIAGNOSIS — Z79899 Other long term (current) drug therapy: Secondary | ICD-10-CM | POA: Diagnosis not present

## 2019-09-11 DIAGNOSIS — N186 End stage renal disease: Secondary | ICD-10-CM | POA: Insufficient documentation

## 2019-09-11 DIAGNOSIS — Z87891 Personal history of nicotine dependence: Secondary | ICD-10-CM | POA: Insufficient documentation

## 2019-09-11 DIAGNOSIS — I959 Hypotension, unspecified: Secondary | ICD-10-CM | POA: Diagnosis not present

## 2019-09-11 DIAGNOSIS — I120 Hypertensive chronic kidney disease with stage 5 chronic kidney disease or end stage renal disease: Secondary | ICD-10-CM | POA: Diagnosis not present

## 2019-09-11 DIAGNOSIS — R569 Unspecified convulsions: Secondary | ICD-10-CM | POA: Diagnosis not present

## 2019-09-11 DIAGNOSIS — R531 Weakness: Secondary | ICD-10-CM | POA: Diagnosis not present

## 2019-09-11 LAB — COMPREHENSIVE METABOLIC PANEL
ALT: 18 U/L (ref 0–44)
AST: 33 U/L (ref 15–41)
Albumin: 3.1 g/dL — ABNORMAL LOW (ref 3.5–5.0)
Alkaline Phosphatase: 45 U/L (ref 38–126)
Anion gap: 15 (ref 5–15)
BUN: 13 mg/dL (ref 8–23)
CO2: 32 mmol/L (ref 22–32)
Calcium: 8.4 mg/dL — ABNORMAL LOW (ref 8.9–10.3)
Chloride: 94 mmol/L — ABNORMAL LOW (ref 98–111)
Creatinine, Ser: 2.63 mg/dL — ABNORMAL HIGH (ref 0.61–1.24)
GFR calc Af Amer: 26 mL/min — ABNORMAL LOW (ref >60)
GFR calc non Af Amer: 22 mL/min — ABNORMAL LOW (ref >60)
Glucose, Bld: 77 mg/dL (ref 70–99)
Potassium: 2.9 mmol/L — ABNORMAL LOW (ref 3.5–5.1)
Sodium: 141 mmol/L (ref 135–145)
Total Bilirubin: 0.8 mg/dL (ref 0.3–1.2)
Total Protein: 6.7 g/dL (ref 6.5–8.1)

## 2019-09-11 LAB — CBC WITH DIFFERENTIAL/PLATELET
Abs Immature Granulocytes: 0.07 10*3/uL (ref 0.00–0.07)
Basophils Absolute: 0 10*3/uL (ref 0.0–0.1)
Basophils Relative: 1 %
Eosinophils Absolute: 0.1 10*3/uL (ref 0.0–0.5)
Eosinophils Relative: 1 %
HCT: 25.8 % — ABNORMAL LOW (ref 39.0–52.0)
Hemoglobin: 8.1 g/dL — ABNORMAL LOW (ref 13.0–17.0)
Immature Granulocytes: 1 %
Lymphocytes Relative: 19 %
Lymphs Abs: 1.3 10*3/uL (ref 0.7–4.0)
MCH: 26.8 pg (ref 26.0–34.0)
MCHC: 31.4 g/dL (ref 30.0–36.0)
MCV: 85.4 fL (ref 80.0–100.0)
Monocytes Absolute: 0.6 10*3/uL (ref 0.1–1.0)
Monocytes Relative: 9 %
Neutro Abs: 4.9 10*3/uL (ref 1.7–7.7)
Neutrophils Relative %: 69 %
Platelets: 147 10*3/uL — ABNORMAL LOW (ref 150–400)
RBC: 3.02 MIL/uL — ABNORMAL LOW (ref 4.22–5.81)
RDW: 15.1 % (ref 11.5–15.5)
WBC: 7 10*3/uL (ref 4.0–10.5)
nRBC: 0 % (ref 0.0–0.2)

## 2019-09-11 LAB — MAGNESIUM: Magnesium: 1.9 mg/dL (ref 1.7–2.4)

## 2019-09-11 NOTE — ED Provider Notes (Signed)
[facility] Emergency Department Provider Note   ____________________________________________   First MD Initiated Contact with Patient 09/11/19 1344     (approximate)  I have reviewed the triage vital signs and the nursing notes.   HISTORY  Chief Complaint Tremors    HPI Tyler Hahn is a 79 y.o. male with past medical history of ESRD on HD, gout, hypertension, hyperlipidemia who presents to the ED for dizziness.  Patient was recently started on dialysis after being admitted last week for acute renal failure.  He was undergoing regularly scheduled dialysis earlier today when he began to feel dizzy and lightheaded.  He has had similar episodes of dizziness with his initial hemodialysis sessions.  Staff at his dialysis center was concerned because he seemed to be tremulous, but he never lost consciousness.  Blood pressure was borderline low initially with EMS.  Upon arrival to the ED, patient reports that he feels better and mostly back to normal.  He states he has otherwise been feeling well recently with no fevers, cough, chest pain, shortness of breath, abdominal pain, vomiting, or diarrhea.        Past Medical History:  Diagnosis Date  . Anxiety   . Automatic implantable cardioverter-defibrillator in situ    greg taylor  . CHF (congestive heart failure) ([place])    2000  . Diabetes mellitus    no meds  . Fatty liver   . Gout    "bout 2-3 months ago"-meds helped.  . Hyperlipidemia   . Hypertension   . Hyperthyroidism   . Kidney cysts   . PONV (postoperative nausea and vomiting)   . Renal insufficiency     Patient Active Problem List   Diagnosis Date Noted  . Bacteremia due to Pseudomonas   . Urinary tract infection associated with indwelling urethral catheter ([place])   . Acute lower UTI   . Elevated BUN   . Weakness   . ESRD (end stage renal disease) ([place]) 08/30/2019  . Acute renal failure (ARF) ([place]) 08/18/2019  . History of anemia due  to CKD 08/18/2019  . Hyperkalemia 08/17/2019  . Hypothyroid 08/16/2019  . Physical exam 02/18/2019  . Greater trochanteric bursitis of left hip 08/04/2018  . Osteoarthritis 09/25/2016  . Screen for colon cancer   . Benign neoplasm of transverse colon   . Edema 07/28/2014  . Elevated LFTs 07/28/2014  . Adjustment disorder with depressed mood 03/24/2014  . Epigastric pain 01/27/2014  . Small bowel mass 12/20/2013  . Abdominal pain, left upper quadrant 11/25/2013  . Myalgia 07/07/2013  . Loss of weight 04/19/2013  . Polyarthralgia 04/19/2013  . Loss of appetite 04/19/2013  . 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 Right elbow pain 08/17/2012  . 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 SYSTOLIC HEART FAILURE, CHRONIC 06/07/2010  . Chronic kidney disease (CKD), stage IV (severe) ([place]) 04/04/2010  . TESTICULAR MASS 04/04/2010  . 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[place]) 03/07/2010  . Hyperlipidemia 03/07/2010  . GOUT 03/07/2010  . Essential hypertension 03/07/2010  . IMPLANTABLE CARDIAC DEFIBRILLATOR -[employer]-SINGLE 03/07/2010    Past Surgical History:  Procedure Laterality Date  . AV FISTULA PLACEMENT Right 08/23/2019   Procedure: ARTERIOVENOUS GORTEX GRAFT RIGHT ARM;  Surgeon: Rosetta Posner, MD;  Location: [facility];  Service: Vascular;  Laterality: Right;  . CARDIAC DEFIBRILLATOR PLACEMENT    . COLONOSCOPY WITH PROPOFOL N/A 10/03/2015   Procedure: COLONOSCOPY WITH PROPOFOL;  Surgeon: Ladene Artist, MD;  Location:  [facility];  Service: Endoscopy;  Laterality: N/A;  . DIAGNOSTIC LAPAROSCOPY  01/27/2014   Dr Brantley Stage  . ENTEROSCOPY N/A 11/25/2013   Procedure: ENTEROSCOPY;  Surgeon: Ladene Artist, MD;  Location: [facility];  Service: Endoscopy;  Laterality: N/A;  . ICD,[place] [employer]    . LAPAROSCOPY N/A 01/27/2014   Procedure: LAPAROSCOPY DIAGNOSTIC;  Surgeon: Joyice Faster. Cornett, MD;  Location: [facility];  Service: General;  Laterality: N/A;  .  polyp removal throat     difficulty speaking    Prior to Admission medications   Medication Sig Start Date End Date Taking? Authorizing Provider  allopurinol (ZYLOPRIM) 100 MG tablet Take 1 tablet (100 mg total) by mouth daily. 04/16/19   [name], MD  atorvastatin (LIPITOR) 20 MG tablet Take 1 tablet (20 mg total) by mouth daily. 04/16/19   [name], MD  carvedilol (COREG) 3.125 MG tablet Take 1 tablet (3.125 mg total) by mouth 2 (two) times daily. 09/04/19   Loletha Grayer, MD  Cholecalciferol (VITAMIN D3) 5000 units CAPS Take 1 capsule by mouth daily.    [provider]  finasteride (PROSCAR) 5 MG tablet Take 1 tablet (5 mg total) by mouth daily. 09/04/19   Loletha Grayer, MD  levofloxacin (LEVAQUIN) 750 MG tablet Take one tablet every other night starting tonight until completed 09/04/19 09/12/19  Loletha Grayer, MD  levothyroxine (SYNTHROID) 25 MCG tablet Take 1 tablet (25 mcg total) by mouth daily before breakfast. 04/16/19   [name], MD  multivitamin (RENA-VIT) TABS tablet Take 1 tablet by mouth at bedtime. 09/04/19   Loletha Grayer, MD  Nutritional Supplements (FEEDING SUPPLEMENT, NEPRO CARB STEADY,) LIQD Take 237 mLs by mouth 2 (two) times daily between meals. 09/04/19   Loletha Grayer, MD  sertraline (ZOLOFT) 50 MG tablet Take 1 tablet (50 mg total) by mouth daily. 04/16/19   [name], MD  atenolol (TENORMIN) 50 MG tablet Take 75 mg by mouth daily.    09/03/11  [provider]  Insulin Glargine (LANTUS SOLOSTAR [place]) Inject into the skin as directed.    09/03/11  [provider]    Allergies Sulfonamide derivatives  Family History  Problem Relation Age of Onset  . Stomach cancer Mother   . Alzheimer's disease Mother   . Diabetes Father   . Heart disease Father   . Liver disease Father   . Kidney disease Father     Social History Social History   Tobacco Use  . Smoking status: Former Smoker     Types: Cigarettes    Quit date: 11/26/1998    Years since quitting: 20.8  . Smokeless tobacco: Never Used  Substance Use Topics  . Alcohol use: No    Alcohol/week: 0.0 standard drinks  . Drug use: No    Review of Systems  Constitutional: No fever/chills Eyes: No visual changes. ENT: No sore throat. Cardiovascular: Denies chest pain.  Positive for dizziness and lightheadedness. Respiratory: Denies shortness of breath. Gastrointestinal: No abdominal pain.  No nausea, no vomiting.  No diarrhea.  No constipation. Genitourinary: Negative for dysuria. Musculoskeletal: Negative for back pain. Skin: Negative for rash. Neurological: Negative for headaches, focal weakness or numbness.  ____________________________________________   PHYSICAL EXAM:  VITAL SIGNS: ED Triage Vitals  Enc Vitals Group     BP      Pulse      Resp      Temp      Temp src      SpO2  Weight      Height      Head Circumference      Peak Flow      Pain Score      Pain Loc      Pain Edu?      Excl. in [place]?     Constitutional: Alert and oriented. Eyes: Conjunctivae are normal. Head: Atraumatic. Nose: No congestion/rhinnorhea. Mouth/Throat: Mucous membranes are moist. Neck: Normal ROM Cardiovascular: Normal rate, regular rhythm. Grossly normal heart sounds.  Right upper extremity AV fistula with palpable thrill, access remains in place. Respiratory: Normal respiratory effort.  No retractions. Lungs CTAB. Gastrointestinal: Soft and nontender. No distention. Genitourinary: deferred Musculoskeletal: No lower extremity tenderness nor edema. Neurologic:  Normal speech and language. No gross focal neurologic deficits are appreciated. Skin:  Skin is warm, dry and intact. No rash noted. Psychiatric: Mood and affect are normal. Speech and behavior are normal.  ____________________________________________   LABS (all labs ordered are listed, but only abnormal results are displayed)  Labs Reviewed    CBC WITH DIFFERENTIAL/PLATELET - Abnormal; Notable for the following components:      Result Value   RBC 3.02 (*)    Hemoglobin 8.1 (*)    HCT 25.8 (*)    Platelets 147 (*)    All other components within normal limits  COMPREHENSIVE METABOLIC PANEL - Abnormal; Notable for the following components:   Potassium 2.9 (*)    Chloride 94 (*)    Creatinine, Ser 2.63 (*)    Calcium 8.4 (*)    Albumin 3.1 (*)    GFR calc non Af Amer 22 (*)    GFR calc Af Amer 26 (*)    All other components within normal limits  MAGNESIUM   ____________________________________________  EKG  ED ECG REPORT I, Blake Divine, the attending physician, personally viewed and interpreted this ECG.   Date: 09/11/2019  EKG Time: 13:58  Rate: 87  Rhythm: normal sinus rhythm  Axis: Normal  Intervals:nonspecific intraventricular conduction delay  ST&T Change: Nonspecific T wave changes   PROCEDURES  Procedure(s) performed (including Critical Care):  .1-3 Lead EKG Interpretation Performed by: Blake Divine, MD Authorized by: Blake Divine, MD     Interpretation: normal     ECG rate:  72   ECG rate assessment: normal     Rhythm: sinus rhythm     Ectopy: none     Conduction: normal       ____________________________________________   INITIAL IMPRESSION / ASSESSMENT AND PLAN / ED COURSE       79 year old male presents to the ED for episode of dizziness and lightheadedness near the end of his dialysis treatment earlier today.  Shaking was reported by staff at dialysis center, however patient never lost consciousness and I doubt he had a seizure.  He now appears to be back to normal, ANO x4 with no focal neurologic deficits.  He states he has otherwise been feeling well with no recent fevers or other infectious symptoms.  He did recently complete treatment for Pseudomonas UTI and bacteremia, states he has been doing well since then.  Vital signs are not consistent with sepsis, EKG shows no  evidence of arrhythmia or ischemia.  Plan to check labs and observe for any recurrent events here in the ED.  Patient continues to feel well here in the ED with no dizziness or other recurrent symptoms.  Lab work remarkable only for hypokalemia, however given patient's ESRD we will hold off on repletion for now.  Patient is new to undergoing hemodialysis and seems to have had occasional dizziness and lightheadedness with his sessions.  He will need to follow-up with his PCP and nephrology, was otherwise counseled to return to the ED for new or worsening symptoms.  Patient and son agree with plan.      ____________________________________________   FINAL CLINICAL IMPRESSION(S) / ED DIAGNOSES  Final diagnoses:  Lightheadedness  ESRD (end stage renal disease) [facility])     ED Discharge Orders    None       Note:  This document was prepared using Dragon voice recognition software and may include unintentional dictation errors.   Blake Divine, MD 09/11/19 [phone]

## 2019-09-11 NOTE — ED Notes (Signed)
This RN spoke with dialysis to deaccess pt's fistula.

## 2019-09-11 NOTE — ED Triage Notes (Signed)
Pt to ED from dialysis. Per EMS pt was receiving dialysis and staff noticed tremors. Per EMS dialysis lasted 2 1/2hrs before it was stopped. Pt receives dialysis Tuesday/Thursday/Friday.   EMS VS:  BP 110/50 CBG 81   Pt stating he feels weak and "off' today. Pt also c/o pain in right ankle from gout. Upon arrival pt alert and in NAD. Pt fistula still currently accessed. Pt has defibrillator in place.

## 2019-09-13 ENCOUNTER — Other Ambulatory Visit: Payer: Self-pay | Admitting: *Deleted

## 2019-09-13 DIAGNOSIS — K760 Fatty (change of) liver, not elsewhere classified: Secondary | ICD-10-CM | POA: Diagnosis not present

## 2019-09-13 DIAGNOSIS — M109 Gout, unspecified: Secondary | ICD-10-CM | POA: Diagnosis not present

## 2019-09-13 DIAGNOSIS — E1122 Type 2 diabetes mellitus with diabetic chronic kidney disease: Secondary | ICD-10-CM | POA: Diagnosis not present

## 2019-09-13 DIAGNOSIS — I132 Hypertensive heart and chronic kidney disease with heart failure and with stage 5 chronic kidney disease, or end stage renal disease: Secondary | ICD-10-CM | POA: Diagnosis not present

## 2019-09-13 DIAGNOSIS — N390 Urinary tract infection, site not specified: Secondary | ICD-10-CM | POA: Diagnosis not present

## 2019-09-13 DIAGNOSIS — D631 Anemia in chronic kidney disease: Secondary | ICD-10-CM | POA: Diagnosis not present

## 2019-09-13 DIAGNOSIS — B965 Pseudomonas (aeruginosa) (mallei) (pseudomallei) as the cause of diseases classified elsewhere: Secondary | ICD-10-CM | POA: Diagnosis not present

## 2019-09-13 DIAGNOSIS — I5022 Chronic systolic (congestive) heart failure: Secondary | ICD-10-CM | POA: Diagnosis not present

## 2019-09-13 DIAGNOSIS — T83511A Infection and inflammatory reaction due to indwelling urethral catheter, initial encounter: Secondary | ICD-10-CM | POA: Diagnosis not present

## 2019-09-13 DIAGNOSIS — N186 End stage renal disease: Secondary | ICD-10-CM | POA: Diagnosis not present

## 2019-09-13 NOTE — Patient Outreach (Signed)
[facility]) Care Management  09/13/2019  MARTINE TRAGESER August 25, 1940 [id number]   Error in opening of encounter  [place]. Lavina Hamman, RN, BSN, [place] Coordinator Office number [phone] Mobile number [phone]  Main [facility] number [phone] Fax number [phone]

## 2019-09-14 ENCOUNTER — Other Ambulatory Visit: Payer: Self-pay | Admitting: *Deleted

## 2019-09-14 ENCOUNTER — Encounter: Payer: Self-pay | Admitting: *Deleted

## 2019-09-14 NOTE — Patient Outreach (Signed)
[place] for a RED FLAG on EMMI discharge call: not taking medications.  Talked with Mr. Vangieson son, [name] today who is a caregiver for his father. He reports he organizes his father's medications and assures they are administered. He answered the Emmi call in that manner since his father is not able to organize and take his meds as ordered.  Currently, [name] reports they have all the services they require at present. They are new to the world of dialysis management of ESRD. They are learning as they go.  Educated about [facility] servcies and will send our information for their future use.  Eulah Pont. Myrtie Neither, MSN, [facility] Gerontological Nurse Practitioner [facility] Care Management [phone]

## 2019-09-15 DIAGNOSIS — K760 Fatty (change of) liver, not elsewhere classified: Secondary | ICD-10-CM | POA: Diagnosis not present

## 2019-09-15 DIAGNOSIS — N390 Urinary tract infection, site not specified: Secondary | ICD-10-CM | POA: Diagnosis not present

## 2019-09-15 DIAGNOSIS — I5022 Chronic systolic (congestive) heart failure: Secondary | ICD-10-CM | POA: Diagnosis not present

## 2019-09-15 DIAGNOSIS — B965 Pseudomonas (aeruginosa) (mallei) (pseudomallei) as the cause of diseases classified elsewhere: Secondary | ICD-10-CM | POA: Diagnosis not present

## 2019-09-15 DIAGNOSIS — I132 Hypertensive heart and chronic kidney disease with heart failure and with stage 5 chronic kidney disease, or end stage renal disease: Secondary | ICD-10-CM | POA: Diagnosis not present

## 2019-09-15 DIAGNOSIS — T83511A Infection and inflammatory reaction due to indwelling urethral catheter, initial encounter: Secondary | ICD-10-CM | POA: Diagnosis not present

## 2019-09-15 DIAGNOSIS — Z992 Dependence on renal dialysis: Secondary | ICD-10-CM | POA: Diagnosis not present

## 2019-09-15 DIAGNOSIS — D631 Anemia in chronic kidney disease: Secondary | ICD-10-CM | POA: Diagnosis not present

## 2019-09-15 DIAGNOSIS — I129 Hypertensive chronic kidney disease with stage 1 through stage 4 chronic kidney disease, or unspecified chronic kidney disease: Secondary | ICD-10-CM | POA: Diagnosis not present

## 2019-09-15 DIAGNOSIS — M109 Gout, unspecified: Secondary | ICD-10-CM | POA: Diagnosis not present

## 2019-09-15 DIAGNOSIS — E1122 Type 2 diabetes mellitus with diabetic chronic kidney disease: Secondary | ICD-10-CM | POA: Diagnosis not present

## 2019-09-15 DIAGNOSIS — N186 End stage renal disease: Secondary | ICD-10-CM | POA: Diagnosis not present

## 2019-09-16 ENCOUNTER — Ambulatory Visit (INDEPENDENT_AMBULATORY_CARE_PROVIDER_SITE_OTHER): Payer: Medicare HMO | Admitting: *Deleted

## 2019-09-16 ENCOUNTER — Telehealth: Payer: Self-pay | Admitting: Emergency Medicine

## 2019-09-16 DIAGNOSIS — N186 End stage renal disease: Secondary | ICD-10-CM | POA: Diagnosis not present

## 2019-09-16 DIAGNOSIS — Z95810 Presence of automatic (implantable) cardiac defibrillator: Secondary | ICD-10-CM | POA: Diagnosis not present

## 2019-09-16 DIAGNOSIS — D509 Iron deficiency anemia, unspecified: Secondary | ICD-10-CM | POA: Diagnosis not present

## 2019-09-16 DIAGNOSIS — Z23 Encounter for immunization: Secondary | ICD-10-CM | POA: Diagnosis not present

## 2019-09-16 DIAGNOSIS — Z992 Dependence on renal dialysis: Secondary | ICD-10-CM | POA: Diagnosis not present

## 2019-09-16 DIAGNOSIS — N2581 Secondary hyperparathyroidism of renal origin: Secondary | ICD-10-CM | POA: Diagnosis not present

## 2019-09-16 DIAGNOSIS — E1129 Type 2 diabetes mellitus with other diabetic kidney complication: Secondary | ICD-10-CM | POA: Diagnosis not present

## 2019-09-16 DIAGNOSIS — D631 Anemia in chronic kidney disease: Secondary | ICD-10-CM | POA: Diagnosis not present

## 2019-09-16 DIAGNOSIS — D689 Coagulation defect, unspecified: Secondary | ICD-10-CM | POA: Diagnosis not present

## 2019-09-16 LAB — CUP PACEART REMOTE DEVICE CHECK
Battery Remaining Longevity: 12 mo
Battery Remaining Percentage: 15 %
Brady Statistic RA Percent Paced: 11 %
Brady Statistic RV Percent Paced: 2 %
Date Time Interrogation Session: 20210401003300
HighPow Impedance: 44 Ohm
Implantable Lead Implant Date: 20091218
Implantable Lead Implant Date: 20091218
Implantable Lead Location: 753859
Implantable Lead Location: 753860
Implantable Lead Model: 158
Implantable Lead Serial Number: 131093
Implantable Pulse Generator Implant Date: 20091218
Lead Channel Impedance Value: 374 Ohm
Lead Channel Impedance Value: 415 Ohm
Lead Channel Pacing Threshold Amplitude: 0.4 V
Lead Channel Pacing Threshold Amplitude: 0.6 V
Lead Channel Pacing Threshold Pulse Width: 0.5 ms
Lead Channel Pacing Threshold Pulse Width: 0.5 ms
Lead Channel Setting Pacing Amplitude: 2 V
Lead Channel Setting Pacing Amplitude: 2.4 V
Lead Channel Setting Pacing Pulse Width: 0.5 ms
Lead Channel Setting Sensing Sensitivity: 0.6 mV
Pulse Gen Serial Number: 131093

## 2019-09-16 NOTE — Progress Notes (Signed)
ICD Remote  

## 2019-09-16 NOTE — Telephone Encounter (Signed)
  Patient alert received that he received ATP and that failed and a 21 J shock on 09/11/19 at 1306.Marland Kitchen Per Epic notes, patient was at dialysis and became dizzy then dialysis staff reported that he began to shake but did not have LOC.  Labs in ED showed K+ 2.9, magnesium 1.9 , Calcium 8.4, HGB 8.1, HCT 25.8. Patient was seen in ED but device was not interoggated. Notes report patient has dizziness with dialysis and patient felt no dizziness in ED, therefore he was discharged after event.  Spoke with [name] (son) and informed him of the fact that patient was shocked on 09/11/19 at time of the event during dialysis. Patient has not been sleeping in room where monitor is located. Educated that his father needs to move monitor to location where he is sleeping to ensure that remote transmission of events occur asap.   LMOM for patient to call office to explain event.

## 2019-09-17 ENCOUNTER — Encounter: Payer: Self-pay | Admitting: *Deleted

## 2019-09-17 NOTE — Telephone Encounter (Addendum)
Able to reach patient. Advised of episode and [place] DMV driving restrictions. Attempted to schedule overdue f/u with Dr. Belva Chimes APP. Pt requests I call his son, Tyler Hahn, to schedule f/u and clarify medications. Advised pt of shock plan, pt verbalizes understanding.  Spoke with Tyler Hahn. He reports pt has not been taking carvedilol due to hypotension and dizziness. Reports ED MD provided these instructions and PCP is aware. Advised of driving restrictions. Tyler Hahn agreeable to appointment on 09/22/19 at 3:15pm with R. Charlcie Cradle, PA-C. He will need to accompany pt due to mobility issues. Will send MyChart message with office address and DC phone number per his request. Tyler Hahn denies any additional questions at this time.

## 2019-09-20 ENCOUNTER — Telehealth (INDEPENDENT_AMBULATORY_CARE_PROVIDER_SITE_OTHER): Payer: Medicare HMO | Admitting: Family Medicine

## 2019-09-20 ENCOUNTER — Other Ambulatory Visit: Payer: Self-pay

## 2019-09-20 ENCOUNTER — Encounter: Payer: Self-pay | Admitting: Family Medicine

## 2019-09-20 DIAGNOSIS — I429 Cardiomyopathy, unspecified: Secondary | ICD-10-CM | POA: Diagnosis not present

## 2019-09-20 DIAGNOSIS — F4321 Adjustment disorder with depressed mood: Secondary | ICD-10-CM

## 2019-09-20 DIAGNOSIS — R69 Illness, unspecified: Secondary | ICD-10-CM | POA: Diagnosis not present

## 2019-09-20 DIAGNOSIS — N186 End stage renal disease: Secondary | ICD-10-CM | POA: Diagnosis not present

## 2019-09-20 DIAGNOSIS — M10332 Gout due to renal impairment, left wrist: Secondary | ICD-10-CM

## 2019-09-20 MED ORDER — PREDNISONE 10 MG PO TABS: ORAL_TABLET | ORAL | 0 refills | Status: DC

## 2019-09-20 MED ORDER — SERTRALINE HCL 100 MG PO TABS: 100.0000 mg | ORAL_TABLET | Freq: Every day | ORAL | 3 refills | Status: DC

## 2019-09-20 NOTE — Progress Notes (Signed)
I have discussed the procedure for the virtual visit with the patient who has given consent to proceed with assessment and treatment.   Pt son unable to obtain vitals on pt.   Davis Gourd, CMA

## 2019-09-20 NOTE — Telephone Encounter (Signed)
Renee   two things  1) can you draw repeat BC   he had Ps A bacteremia a couple of weeks ago 2) His VTVF episode was assos with K of < 2.8 or so,  I have reached out to renal and they will change his dialysate to K 3.0 Thanks SK

## 2019-09-20 NOTE — Progress Notes (Signed)
Virtual Visit via Video   I connected with patient on 09/20/19 at 11:00 AM EDT by a video enabled telemedicine application and verified that I am speaking with the correct person using two identifiers.  Location patient: Home Location provider: Acupuncturist, Office Persons participating in the virtual visit: Patient, Provider, Tyler Hahn (Jess B)  I discussed the limitations of evaluation and management by telemedicine and the availability of in person appointments. The patient expressed understanding and agreed to proceed.  Subjective:   HPI:  ER f/u- pt went to ER on 3/27 w/ low BP, tremors (which they later found out was due to a defibrillator shock) after HD.  Pt was back to baseline by the time he arrived in ER.  He has appt upcoming w/ Dr Caryl Comes given that his ICD fired but they did not feel there was an urgency to it given that his sxs had normalized.  He reports that his defibrillator site is quite sore.  He has lost a bit of weight and he can now feel the wires and this is causing pain radiating up into his neck and L arm.  He denies chest pain or pressure.  He says the pain is '60% better' than it was a few days ago.  Gout- pt's biggest concern is the pain and swelling of his L wrist and his ankles.  Since starting HD, he has had ongoing issues w/ gout.  This was to be managed by Renal but pt and son are not sure what if any changes have been made.  Pt is in considerable pain.  Depression- pt continues to struggle w/ the recent loss of his wife and now all of his medical issues.  Son would like to increase medication.  ROS:   See pertinent positives and negatives per HPI.  Patient Active Problem List   Diagnosis Date Noted  . Bacteremia due to Pseudomonas   . Urinary tract infection associated with indwelling urethral catheter ([place])   . Acute lower UTI   . Elevated BUN   . Weakness   . ESRD (end stage renal disease) ([place]) 08/30/2019  . Acute renal failure (ARF) ([place])  08/18/2019  . History of anemia due to CKD 08/18/2019  . Hyperkalemia 08/17/2019  . Hypothyroid 08/16/2019  . Physical exam 02/18/2019  . Greater trochanteric bursitis of left hip 08/04/2018  . Osteoarthritis 09/25/2016  . Screen for colon cancer   . Benign neoplasm of transverse colon   . Edema 07/28/2014  . Elevated LFTs 07/28/2014  . Adjustment disorder with depressed mood 03/24/2014  . Epigastric pain 01/27/2014  . Small bowel mass 12/20/2013  . Abdominal pain, left upper quadrant 11/25/2013  . Myalgia 07/07/2013  . Loss of weight 04/19/2013  . Polyarthralgia 04/19/2013  . Loss of appetite 04/19/2013  . 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 Right elbow pain 08/17/2012  . 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 SYSTOLIC HEART FAILURE, CHRONIC 06/07/2010  . Chronic kidney disease (CKD), stage IV (severe) ([place]) 04/04/2010  . TESTICULAR MASS 04/04/2010  . 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[place]) 03/07/2010  . Hyperlipidemia 03/07/2010  . GOUT 03/07/2010  . Essential hypertension 03/07/2010  . 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 Smoking status: Former Smoker    Types: Cigarettes    Quit date: 11/26/1998    Years since quitting: 20.8  . Smokeless tobacco: Never Used  Substance Use Topics  . Alcohol use:  No    Alcohol/week: 0.0 standard drinks    Current Outpatient Medications:  .  allopurinol (ZYLOPRIM) 100 MG tablet, Take 1 tablet (100 mg total) by mouth daily., Disp: 90 tablet, Rfl: 1 .  atorvastatin (LIPITOR) 20 MG tablet, Take 1 tablet (20 mg total) by mouth daily., Disp: 90 tablet, Rfl: 1 .  finasteride (PROSCAR) 5 MG tablet, Take 1 tablet (5 mg total) by mouth daily., Disp: 30 tablet, Rfl: 0 .  levothyroxine (SYNTHROID) 25 MCG tablet, Take 1 tablet (25 mcg total) by mouth daily before breakfast., Disp: 90 tablet, Rfl: 1 .  multivitamin (RENA-VIT) TABS tablet, Take 1  tablet by mouth at bedtime., Disp: 30 tablet, Rfl: 0 .  Nutritional Supplements (FEEDING SUPPLEMENT, NEPRO CARB STEADY,) LIQD, Take 237 mLs by mouth 2 (two) times daily between meals., Disp: 14220 mL, Rfl: 0 .  sertraline (ZOLOFT) 50 MG tablet, Take 1 tablet (50 mg total) by mouth daily., Disp: 90 tablet, Rfl: 1 .  atenolol (TENORMIN) 50 MG tablet, Take 1.5 tablet by mouth once a day as directed 75 mg daily, Disp: , Rfl:  .  carvedilol (COREG) 3.125 MG tablet, Take 1 tablet (3.125 mg total) by mouth 2 (two) times daily. (Patient not taking: Reported on 09/17/2019), Disp: 60 tablet, Rfl: 0 .  Cholecalciferol (VITAMIN D3) 5000 units CAPS, Take 1 capsule by mouth daily., Disp: , Rfl:  .  LANTUS 100 UNIT/ML injection, See admin instructions., Disp: , Rfl:   Allergies  Allergen Reactions  . Sulfonamide Derivatives Itching and Rash    Objective:   There were no vitals taken for this visit.  AAOx3, NAD NCAT, EOMI No obvious CN deficits Coloring WNL Pt is able to speak clearly, coherently without shortness of breath or increased work of breathing.  Thought process is linear.  Mood is appropriate.  L wrist is mildly swollen  Assessment and Plan:   HD- pt is still working with Renal on the appropriate dry weight and his hypotension/dizziness/fatigue w/ HD.  Encouraged him to speak up when symptomatic.  Gout- ongoing issue for pt.  Suspect this is triggered by HD.  Since family is not aware of any current treatment and pt is in pain, will start prednisone taper.  Pt expressed understanding and is in agreement w/ plan.   Depression- deteriorated w/ loss of his wife and his major change in health status.  Will increase sertraline to 100mg  daily.  Pt expressed understanding and is in agreement w/ plan.   Cardiomyopathy- pt had ICD shock on 3/27.  Currently asymptomatic.  Has f/u upcoming w/ Cards.   [name], MD 09/20/2019

## 2019-09-21 NOTE — Progress Notes (Signed)
Cardiology Office Note Date:  09/21/2019  Patient ID:  Tyler Hahn, Tyler Hahn [birth year], MRN [id number] PCP:  [name], MD  Electrophysiologst:  Dr. Caryl Comes Nephrology: Dr. Joelyn Oms   Chief Complaint:  ICD shock  History of Present Illness: Tyler Hahn is a 79 y.o. male with history of NICM w/ICD, HTN, hyperthyroidism (follows with endo), HLD, CRI (IV).  He comes today to be seen for Dr. Caryl Comes, last seen by him in April 2018, at that visit, doing well, no changes were made to his tx.  I saw him Jun 2019, he was doing well.   Was not very physically active, spendinf most of his time visiting with his wife at the SNF, where most of the time he is seated, occasionally will get some walks in, though reports takes care of his home and denies any exertional intolerances with his ADLs.  He denied any CP, palpitations, no rest SOB, no DOE, no symptoms of PND or orthopnea.  No dizziness, near syncope or syncope.  He was seeing his nephrologist about every 3 months, saw him the week prior.  He had trace edema, they discussed it, made no changes other then the addition of a vitamin.  He was also seeing his PMD about Q 3-4 mo as well.  Planned for annual EP/device visits. This was his last visit with [place], likely 2/2 COVID, + remotes.  He had a few hospital visits with AKI, and has subsequently been started on HD.   08/23/2019  RUE AV graft placed 09/11/2019 was in [facility] ER 2/2 weakness, tremulousness while at dialysis, mentioned borderline low BP . He had no LOC, he was not felt to have had a seizure, was nonfocal and feeling better in the ER.  Recently treated for UTI.  Was mildly hypokalemic, rhythm . Discharged from the ER without particular intervention.  DEVICE clinic however noted true VT/ VF episodes failed ATP and shocked  Dr. Caryl Comes was made aware, pt's K+ was 2.9, (mag Dr. Caryl Comes discussed with nephrology with plans to change his dialysate to K 3.0 Noted the patient with recent  Pseudomonas aeruginosa bacteremia (08/31/19) and asked that repeat [facility] be draw at the time of our appt (f.u 09/03/19 x2 were neg x5 days)  He comes today accompanied by his son who he lives with and helps take care of his medicines/care.  Since his ER visit he is not taking the coreg 2/2 low BPs.  Since stopping the coreg low BP have no longer been an issue, nurses have not mentioned any further.  The patient is doing well.  New to HD now about a month or so.  Tolerating so far except his BP was low.  He denies any CP, has some tenderness by his site.  No CP otherwise.  No recurrent weak spells, no further syncope. No palpitations, no SOB.  He sleeps well, no symptoms of PND or orthopnea. LE swelling remains better since on HD.  Device information: BSCi dual chamber ICD, implanted  06/03/08   Past Medical History:  Diagnosis Date  . Anxiety   . Automatic implantable cardioverter-defibrillator in situ    greg taylor  . CHF (congestive heart failure) ([place])    2000  . Diabetes mellitus    no meds  . Fatty liver   . Gout    "bout 2-3 months ago"-meds helped.  . Hyperlipidemia   . Hypertension   . Hyperthyroidism   . Kidney cysts   . PONV (postoperative nausea  and vomiting)   . Renal insufficiency     Past Surgical History:  Procedure Laterality Date  . AV FISTULA PLACEMENT Right 08/23/2019   Procedure: ARTERIOVENOUS GORTEX GRAFT RIGHT ARM;  Surgeon: Rosetta Posner, MD;  Location: [place];  Service: Vascular;  Laterality: Right;  . CARDIAC DEFIBRILLATOR PLACEMENT    . COLONOSCOPY WITH PROPOFOL N/A 10/03/2015   Procedure: COLONOSCOPY WITH PROPOFOL;  Surgeon: Ladene Artist, MD;  Location: [facility];  Service: Endoscopy;  Laterality: N/A;  . DIAGNOSTIC LAPAROSCOPY  01/27/2014   Dr Brantley Stage  . ENTEROSCOPY N/A 11/25/2013   Procedure: ENTEROSCOPY;  Surgeon: Ladene Artist, MD;  Location: [facility];  Service: Endoscopy;  Laterality: N/A;  . ICD,[place] [employer]    . LAPAROSCOPY N/A  01/27/2014   Procedure: LAPAROSCOPY DIAGNOSTIC;  Surgeon: Joyice Faster. Cornett, MD;  Location: [place];  Service: General;  Laterality: N/A;  . polyp removal throat     difficulty speaking    Current Outpatient Medications  Medication Sig Dispense Refill  . allopurinol (ZYLOPRIM) 100 MG tablet Take 1 tablet (100 mg total) by mouth daily. 90 tablet 1  . atenolol (TENORMIN) 50 MG tablet Take 1.5 tablet by mouth once a day as directed 75 mg daily    . atorvastatin (LIPITOR) 20 MG tablet Take 1 tablet (20 mg total) by mouth daily. 90 tablet 1  . carvedilol (COREG) 3.125 MG tablet Take 1 tablet (3.125 mg total) by mouth 2 (two) times daily. (Patient not taking: Reported on 09/17/2019) 60 tablet 0  . Cholecalciferol (VITAMIN D3) 5000 units CAPS Take 1 capsule by mouth daily.    . finasteride (PROSCAR) 5 MG tablet Take 1 tablet (5 mg total) by mouth daily. 30 tablet 0  . LANTUS 100 UNIT/ML injection See admin instructions.    Tyler Hahn Kitchen levothyroxine (SYNTHROID) 25 MCG tablet Take 1 tablet (25 mcg total) by mouth daily before breakfast. 90 tablet 1  . multivitamin (RENA-VIT) TABS tablet Take 1 tablet by mouth at bedtime. 30 tablet 0  . Nutritional Supplements (FEEDING SUPPLEMENT, NEPRO CARB STEADY,) LIQD Take 237 mLs by mouth 2 (two) times daily between meals. 14220 mL 0  . predniSONE (DELTASONE) 10 MG tablet 3 tabs x3 days and then 2 tabs x3 days and then 1 tab x3 days.  Take w/ food. 18 tablet 0  . sertraline (ZOLOFT) 100 MG tablet Take 1 tablet (100 mg total) by mouth daily. 30 tablet 3   No current facility-administered medications for this visit.    Allergies:   Sulfonamide derivatives   Social History:  The patient  reports that he quit smoking about 20 years ago. His smoking use included cigarettes. He has never used smokeless tobacco. He reports that he does not drink alcohol or use drugs.   Family History:  The patient's family history includes Alzheimer's disease in his mother; Diabetes in his father;  Heart disease in his father; Kidney disease in his father; Liver disease in his father; Stomach cancer in his mother.  ROS:  Please see the history of present illness.  All other systems are reviewed and otherwise negative.   PHYSICAL EXAM:  VS:  There were no vitals taken for this visit. BMI: There is no height or weight on file to calculate BMI. Well nourished, well developed, in no acute distress  HEENT: normocephalic, atraumatic  Neck: no JVD, carotid bruits or masses Cardiac:  RRR; no significant murmurs, no rubs, or gallops Lungs:  CTA b/l, no wheezing, rhonchi or rales  Abd: soft, nontender MS: no deformity,  + age appropriate atrophy Ext: no edema  Skin: warm and dry, no rash Neuro:  No gross deficits appreciated Psych: euthymic mood, full affect  ICD site is stable, no tethering or discomfort.   He has skin tenderness at superior edge where leads are, skin looks goo, no thinning erythema, or signs of erosion He mentions he has lost some weight   EKG:  09/11/2019 : reviewed: SR, no ischemic looking changes  ICD interrogation done today and reviewed by myself:  Battery and lead measurements are stable 09/11/2019 VT>VF failed ATP with successful HV Therapy. None since He had NSVT episodes on 3.08/23/2019 (had AV graft surgery that day)   09/17/12: TTE Study Conclusion - Left ventricle: The cavity size was mildly dilated. Wall thickness was normal. Systolic function was severely reduced. The estimated ejection fraction was in the range of 20% to 25%. Diffuse hypokinesis. Doppler parameters are consistent with abnormal left ventricular relaxation (grade 1 diastolic dysfunction). - Aortic valve: There was no stenosis. - Mitral valve: Mildly calcified annulus. Trivial regurgitation. - Left atrium: The atrium was mildly dilated. - Right ventricle: The cavity size was normal. Pacer wire or catheter noted in right ventricle. Systolic function was mildly  reduced. - Pulmonary arteries: No complete TR doppler jet so unable to estimate PA systolic pressure. - Inferior vena cava: The vessel was normal in size; the respirophasic diameter changes were in the normal range (= 50%); findings are consistent with normal central venous pressure. Impressions: - Mildly dilated left ventricle with severe global hypokinesis, EF 20-25%. Normal RV size with mildly decreased systolic function. No significant valvular abnormalities.  Recent Labs: 08/30/2019: TSH 4.183 09/11/2019: ALT 18; BUN 13; Creatinine, Ser 2.63; Hemoglobin 8.1; Magnesium 1.9; Platelets 147; Potassium 2.9; Sodium 141  08/16/2019: Cholesterol 146; HDL 39.70; LDL Cholesterol 79; Total CHOL/HDL Ratio 4; Triglycerides 136.0; VLDL 27.2   Estimated Creatinine Clearance: 22 mL/min (A) (by C-G formula based on SCr of 2.63 mg/dL (H)).   Wt Readings from Last 3 Encounters:  09/11/19 158 lb 11.7 oz (72 kg)  08/31/19 158 lb 11.7 oz (72 kg)  08/22/19 163 lb 5.8 oz (74.1 kg)     Other studies reviewed: Additional studies/records reviewed today include: summarized above  ASSESSMENT AND PLAN:  1. ICD     Intact function, no changes made     [employer] rep was here today, will order a new home transmitter that does not require phone line and can be placed in the room the patient sleeps then.  Should have it by end of week or early next week.     Battery life estimate is 1 year, I have asked device clinic to start monthly battery checks  2. NICM     volume managed with HD   3. HTN     Relative hypotension now with HD     Off his coreg     Given his VT happened in the environment of significant hypokalemia, will stay off BB for now   4. VT/VF     Most likely 2/2 hypokalemia     Dr. Caryl Comes has communicated already with his nephrologist regarding K+ with his HD     Son/patient confirm labs done with dialysis     Pt was already made aware of driving restrictions via the device clinic  RN  5. Recent bacteremia     Given his ICD< Dr. Caryl Comes would like repeat [facility] done today  Ordered.       Disposition: Will have him back in 58mo, sooner if needed.    Current medicines are reviewed at length with the patient today.  The patient did not have any concerns regarding medicines.  Venetia Night, PA-C 09/21/2019 6:30 AM     [facility] [address] [place] [address] [phone] (office)  [phone] (fax)

## 2019-09-22 ENCOUNTER — Other Ambulatory Visit: Payer: Self-pay | Admitting: Physician Assistant

## 2019-09-22 ENCOUNTER — Ambulatory Visit (INDEPENDENT_AMBULATORY_CARE_PROVIDER_SITE_OTHER): Payer: Medicare HMO | Admitting: Physician Assistant

## 2019-09-22 ENCOUNTER — Other Ambulatory Visit: Payer: Self-pay

## 2019-09-22 VITALS — BP 122/54 | Ht 67.0 in | Wt 157.0 lb

## 2019-09-22 DIAGNOSIS — I472 Ventricular tachycardia, unspecified: Secondary | ICD-10-CM

## 2019-09-22 DIAGNOSIS — Z95810 Presence of automatic (implantable) cardiac defibrillator: Secondary | ICD-10-CM | POA: Diagnosis not present

## 2019-09-22 DIAGNOSIS — I5022 Chronic systolic (congestive) heart failure: Secondary | ICD-10-CM

## 2019-09-22 DIAGNOSIS — I428 Other cardiomyopathies: Secondary | ICD-10-CM

## 2019-09-22 DIAGNOSIS — D631 Anemia in chronic kidney disease: Secondary | ICD-10-CM | POA: Diagnosis not present

## 2019-09-22 DIAGNOSIS — I4901 Ventricular fibrillation: Secondary | ICD-10-CM | POA: Diagnosis not present

## 2019-09-22 DIAGNOSIS — R7881 Bacteremia: Secondary | ICD-10-CM

## 2019-09-22 DIAGNOSIS — N186 End stage renal disease: Secondary | ICD-10-CM | POA: Diagnosis not present

## 2019-09-22 DIAGNOSIS — B965 Pseudomonas (aeruginosa) (mallei) (pseudomallei) as the cause of diseases classified elsewhere: Secondary | ICD-10-CM | POA: Diagnosis not present

## 2019-09-22 DIAGNOSIS — I10 Essential (primary) hypertension: Secondary | ICD-10-CM | POA: Diagnosis not present

## 2019-09-22 DIAGNOSIS — I132 Hypertensive heart and chronic kidney disease with heart failure and with stage 5 chronic kidney disease, or end stage renal disease: Secondary | ICD-10-CM | POA: Diagnosis not present

## 2019-09-22 DIAGNOSIS — M109 Gout, unspecified: Secondary | ICD-10-CM | POA: Diagnosis not present

## 2019-09-22 DIAGNOSIS — K760 Fatty (change of) liver, not elsewhere classified: Secondary | ICD-10-CM | POA: Diagnosis not present

## 2019-09-22 DIAGNOSIS — E1122 Type 2 diabetes mellitus with diabetic chronic kidney disease: Secondary | ICD-10-CM | POA: Diagnosis not present

## 2019-09-22 DIAGNOSIS — T83511A Infection and inflammatory reaction due to indwelling urethral catheter, initial encounter: Secondary | ICD-10-CM | POA: Diagnosis not present

## 2019-09-22 DIAGNOSIS — N390 Urinary tract infection, site not specified: Secondary | ICD-10-CM | POA: Diagnosis not present

## 2019-09-22 NOTE — Patient Instructions (Addendum)
Medication Instructions:  Your physician recommends that you continue on your current medications as directed. Please refer to the Current Medication list given to you today.  *If you need a refill on your cardiac medications before your next appointment, please call your pharmacy*   Lab Work:  Blood culture labs X2     If you have labs (blood work) drawn today and your tests are completely normal, you will receive your results only by: Marland Kitchen MyChart Message (if you have MyChart) OR . A paper copy in the mail If you have any lab test that is abnormal or we need to change your treatment, we will call you to review the results.   Testing/Procedures: NONE ORDERED  TODAY  Follow-Up: At [facility], you and your health needs are our priority.  As part of our continuing mission to provide you with exceptional heart care, we have created designated Provider Care Teams.  These Care Teams include your primary Cardiologist (physician) and Advanced Practice Providers (APPs -  Physician Assistants and Nurse Practitioners) who all work together to provide you with the care you need, when you need it.  We recommend signing up for the patient portal called "MyChart".  Sign up information is provided on this After Visit Summary.  MyChart is used to connect with patients for Virtual Visits (Telemedicine).  Patients are able to view lab/test results, encounter notes, upcoming appointments, etc.  Non-urgent messages can be sent to your provider as well.   To learn more about what you can do with MyChart, go to [email or web address].    Your next appointment:   6 month(s)  The format for your next appointment:   In Person  Provider:   You may see Dr. Caryl Comes  or one of the following Advanced Practice Providers on your designated Care Team:    Chanetta Marshall, NP  [name], PA-C  Legrand Como "Oda Kilts, [place]    Other Instructions

## 2019-09-24 DIAGNOSIS — T83511A Infection and inflammatory reaction due to indwelling urethral catheter, initial encounter: Secondary | ICD-10-CM | POA: Diagnosis not present

## 2019-09-24 DIAGNOSIS — I132 Hypertensive heart and chronic kidney disease with heart failure and with stage 5 chronic kidney disease, or end stage renal disease: Secondary | ICD-10-CM | POA: Diagnosis not present

## 2019-09-24 DIAGNOSIS — M109 Gout, unspecified: Secondary | ICD-10-CM | POA: Diagnosis not present

## 2019-09-24 DIAGNOSIS — D631 Anemia in chronic kidney disease: Secondary | ICD-10-CM | POA: Diagnosis not present

## 2019-09-24 DIAGNOSIS — B965 Pseudomonas (aeruginosa) (mallei) (pseudomallei) as the cause of diseases classified elsewhere: Secondary | ICD-10-CM | POA: Diagnosis not present

## 2019-09-24 DIAGNOSIS — K760 Fatty (change of) liver, not elsewhere classified: Secondary | ICD-10-CM | POA: Diagnosis not present

## 2019-09-24 DIAGNOSIS — N186 End stage renal disease: Secondary | ICD-10-CM | POA: Diagnosis not present

## 2019-09-24 DIAGNOSIS — E1122 Type 2 diabetes mellitus with diabetic chronic kidney disease: Secondary | ICD-10-CM | POA: Diagnosis not present

## 2019-09-24 DIAGNOSIS — N390 Urinary tract infection, site not specified: Secondary | ICD-10-CM | POA: Diagnosis not present

## 2019-09-24 DIAGNOSIS — I5022 Chronic systolic (congestive) heart failure: Secondary | ICD-10-CM | POA: Diagnosis not present

## 2019-09-27 DIAGNOSIS — I132 Hypertensive heart and chronic kidney disease with heart failure and with stage 5 chronic kidney disease, or end stage renal disease: Secondary | ICD-10-CM | POA: Diagnosis not present

## 2019-09-27 DIAGNOSIS — D631 Anemia in chronic kidney disease: Secondary | ICD-10-CM | POA: Diagnosis not present

## 2019-09-27 DIAGNOSIS — B965 Pseudomonas (aeruginosa) (mallei) (pseudomallei) as the cause of diseases classified elsewhere: Secondary | ICD-10-CM | POA: Diagnosis not present

## 2019-09-27 DIAGNOSIS — T83511A Infection and inflammatory reaction due to indwelling urethral catheter, initial encounter: Secondary | ICD-10-CM | POA: Diagnosis not present

## 2019-09-27 DIAGNOSIS — N186 End stage renal disease: Secondary | ICD-10-CM | POA: Diagnosis not present

## 2019-09-27 DIAGNOSIS — K760 Fatty (change of) liver, not elsewhere classified: Secondary | ICD-10-CM | POA: Diagnosis not present

## 2019-09-27 DIAGNOSIS — M109 Gout, unspecified: Secondary | ICD-10-CM | POA: Diagnosis not present

## 2019-09-27 DIAGNOSIS — I5022 Chronic systolic (congestive) heart failure: Secondary | ICD-10-CM | POA: Diagnosis not present

## 2019-09-27 DIAGNOSIS — N390 Urinary tract infection, site not specified: Secondary | ICD-10-CM | POA: Diagnosis not present

## 2019-09-27 DIAGNOSIS — E1122 Type 2 diabetes mellitus with diabetic chronic kidney disease: Secondary | ICD-10-CM | POA: Diagnosis not present

## 2019-09-28 LAB — CULTURE, BLOOD (SINGLE)

## 2019-09-29 ENCOUNTER — Ambulatory Visit (INDEPENDENT_AMBULATORY_CARE_PROVIDER_SITE_OTHER): Payer: Self-pay | Admitting: Physician Assistant

## 2019-09-29 ENCOUNTER — Other Ambulatory Visit: Payer: Self-pay

## 2019-09-29 VITALS — BP 133/68 | HR 95 | Temp 97.6°F | Resp 16 | Ht 67.0 in | Wt 156.1 lb

## 2019-09-29 DIAGNOSIS — Z992 Dependence on renal dialysis: Secondary | ICD-10-CM

## 2019-09-29 DIAGNOSIS — N186 End stage renal disease: Secondary | ICD-10-CM

## 2019-09-29 NOTE — Progress Notes (Signed)
  POST OPERATIVE OFFICE NOTE    CC:  F/u for surgery  HPI:  This is a 79 y.o. male who is s/p RUA AVG on 08/23/2019 by Dr. Donnetta Hutching.  He states that he started dialysis a couple of weeks ago and it is going well.  He tells me they are using his graft and it is working well.  He does not have any pain or numbness in his hand.   He can't straighten his pinky finger but this is nothing new.    He is retired from working as an Art therapist.     Allergies  Allergen Reactions  . Sulfonamide Derivatives Itching and Rash    Current Outpatient Medications  Medication Sig Dispense Refill  . allopurinol (ZYLOPRIM) 100 MG tablet Take 1 tablet (100 mg total) by mouth daily. 90 tablet 1  . atorvastatin (LIPITOR) 20 MG tablet Take 1 tablet (20 mg total) by mouth daily. 90 tablet 1  . Cholecalciferol (VITAMIN D3) 5000 units CAPS Take 1 capsule by mouth daily.    . finasteride (PROSCAR) 5 MG tablet Take 1 tablet (5 mg total) by mouth daily. 30 tablet 0  . levothyroxine (SYNTHROID) 25 MCG tablet Take 1 tablet (25 mcg total) by mouth daily before breakfast. 90 tablet 1  . multivitamin (RENA-VIT) TABS tablet Take 1 tablet by mouth at bedtime. 30 tablet 0  . Nutritional Supplements (FEEDING SUPPLEMENT, NEPRO CARB STEADY,) LIQD Take 237 mLs by mouth 2 (two) times daily between meals. 14220 mL 0  . predniSONE (DELTASONE) 10 MG tablet 3 tabs x3 days and then 2 tabs x3 days and then 1 tab x3 days.  Take w/ food. 18 tablet 0  . sertraline (ZOLOFT) 100 MG tablet Take 1 tablet (100 mg total) by mouth daily. 30 tablet 3   No current facility-administered medications for this visit.     ROS:  See HPI  Physical Exam:  Today's Vitals   09/29/19 1302  BP: 133/68  Pulse: 95  Resp: 16  Temp: 97.6 F (36.4 C)  SpO2: 99%  Weight: 156 lb 1.6 oz (70.8 kg)  Height: 5\' 7"  (1.702 m)   Body mass index is 24.45 kg/m.  Incision:  Well healed Extremities:  There is a palpable right radial pulse.  Motor and sensory  are in tact.  There is a thrill/bruit present and is easily palpable.  Strong right hand grip      Assessment/Plan:  This is a 78 y.o. male who is s/p: RUA AVG 08/23/2019 by Dr. Donnetta Hutching  -the pt does does not have evidence of steal. -the fistula/graft can be used now   -the pt will follow up as needed.  Discussed with him that this will most likely need some maintenance in the future and he expressed understanding.    Leontine Locket, [facility] [phone]  Clinic MD:  Scot Dock

## 2019-09-30 ENCOUNTER — Telehealth: Payer: Self-pay | Admitting: Family Medicine

## 2019-09-30 NOTE — Progress Notes (Signed)
  Chronic Care Management   Note  09/30/2019 Name: Tyler Hahn MRN: [id number] DOB: May 13, 1941  Tyler Hahn is a 79 y.o. year old male who is a primary care patient of Birdie Riddle, Aundra Millet, MD. I reached out to [name] by phone today in response to a referral sent by Tyler Hahn's PCP, [name], MD.   Tyler Hahn was given information about Chronic Care Management services today including:  1. CCM service includes personalized support from designated clinical staff supervised by his physician, including individualized plan of care and coordination with other care providers 2. 24/7 contact phone numbers for assistance for urgent and routine care needs. 3. Service will only be billed when office clinical staff spend 20 minutes or more in a month to coordinate care. 4. Only one practitioner may furnish and bill the service in a calendar month. 5. The patient may stop CCM services at any time (effective at the end of the month) by phone call to the office staff.   Patient agreed to services and verbal consent obtained.   Follow up plan:   Earney Hamburg Upstream Scheduler

## 2019-10-04 DIAGNOSIS — T83511A Infection and inflammatory reaction due to indwelling urethral catheter, initial encounter: Secondary | ICD-10-CM | POA: Diagnosis not present

## 2019-10-04 DIAGNOSIS — K760 Fatty (change of) liver, not elsewhere classified: Secondary | ICD-10-CM | POA: Diagnosis not present

## 2019-10-04 DIAGNOSIS — I5022 Chronic systolic (congestive) heart failure: Secondary | ICD-10-CM | POA: Diagnosis not present

## 2019-10-04 DIAGNOSIS — I132 Hypertensive heart and chronic kidney disease with heart failure and with stage 5 chronic kidney disease, or end stage renal disease: Secondary | ICD-10-CM | POA: Diagnosis not present

## 2019-10-04 DIAGNOSIS — N186 End stage renal disease: Secondary | ICD-10-CM | POA: Diagnosis not present

## 2019-10-04 DIAGNOSIS — D631 Anemia in chronic kidney disease: Secondary | ICD-10-CM | POA: Diagnosis not present

## 2019-10-04 DIAGNOSIS — B965 Pseudomonas (aeruginosa) (mallei) (pseudomallei) as the cause of diseases classified elsewhere: Secondary | ICD-10-CM | POA: Diagnosis not present

## 2019-10-04 DIAGNOSIS — E1122 Type 2 diabetes mellitus with diabetic chronic kidney disease: Secondary | ICD-10-CM | POA: Diagnosis not present

## 2019-10-04 DIAGNOSIS — M109 Gout, unspecified: Secondary | ICD-10-CM | POA: Diagnosis not present

## 2019-10-04 DIAGNOSIS — N390 Urinary tract infection, site not specified: Secondary | ICD-10-CM | POA: Diagnosis not present

## 2019-10-06 DIAGNOSIS — E039 Hypothyroidism, unspecified: Secondary | ICD-10-CM | POA: Diagnosis not present

## 2019-10-06 DIAGNOSIS — E1122 Type 2 diabetes mellitus with diabetic chronic kidney disease: Secondary | ICD-10-CM | POA: Diagnosis not present

## 2019-10-06 DIAGNOSIS — N186 End stage renal disease: Secondary | ICD-10-CM | POA: Diagnosis not present

## 2019-10-06 DIAGNOSIS — Z992 Dependence on renal dialysis: Secondary | ICD-10-CM | POA: Diagnosis not present

## 2019-10-06 DIAGNOSIS — R69 Illness, unspecified: Secondary | ICD-10-CM | POA: Diagnosis not present

## 2019-10-06 DIAGNOSIS — I509 Heart failure, unspecified: Secondary | ICD-10-CM | POA: Diagnosis not present

## 2019-10-06 DIAGNOSIS — M109 Gout, unspecified: Secondary | ICD-10-CM | POA: Diagnosis not present

## 2019-10-06 DIAGNOSIS — E785 Hyperlipidemia, unspecified: Secondary | ICD-10-CM | POA: Diagnosis not present

## 2019-10-08 ENCOUNTER — Other Ambulatory Visit: Payer: Self-pay | Admitting: General Practice

## 2019-10-08 DIAGNOSIS — E039 Hypothyroidism, unspecified: Secondary | ICD-10-CM

## 2019-10-08 DIAGNOSIS — E1122 Type 2 diabetes mellitus with diabetic chronic kidney disease: Secondary | ICD-10-CM

## 2019-10-08 DIAGNOSIS — I10 Essential (primary) hypertension: Secondary | ICD-10-CM

## 2019-10-11 DIAGNOSIS — M109 Gout, unspecified: Secondary | ICD-10-CM | POA: Diagnosis not present

## 2019-10-11 DIAGNOSIS — I5022 Chronic systolic (congestive) heart failure: Secondary | ICD-10-CM | POA: Diagnosis not present

## 2019-10-11 DIAGNOSIS — T83511A Infection and inflammatory reaction due to indwelling urethral catheter, initial encounter: Secondary | ICD-10-CM | POA: Diagnosis not present

## 2019-10-11 DIAGNOSIS — D631 Anemia in chronic kidney disease: Secondary | ICD-10-CM | POA: Diagnosis not present

## 2019-10-11 DIAGNOSIS — B965 Pseudomonas (aeruginosa) (mallei) (pseudomallei) as the cause of diseases classified elsewhere: Secondary | ICD-10-CM | POA: Diagnosis not present

## 2019-10-11 DIAGNOSIS — K760 Fatty (change of) liver, not elsewhere classified: Secondary | ICD-10-CM | POA: Diagnosis not present

## 2019-10-11 DIAGNOSIS — N390 Urinary tract infection, site not specified: Secondary | ICD-10-CM | POA: Diagnosis not present

## 2019-10-11 DIAGNOSIS — E1122 Type 2 diabetes mellitus with diabetic chronic kidney disease: Secondary | ICD-10-CM | POA: Diagnosis not present

## 2019-10-11 DIAGNOSIS — N186 End stage renal disease: Secondary | ICD-10-CM | POA: Diagnosis not present

## 2019-10-11 DIAGNOSIS — I132 Hypertensive heart and chronic kidney disease with heart failure and with stage 5 chronic kidney disease, or end stage renal disease: Secondary | ICD-10-CM | POA: Diagnosis not present

## 2019-10-14 DIAGNOSIS — N186 End stage renal disease: Secondary | ICD-10-CM | POA: Diagnosis not present

## 2019-10-14 DIAGNOSIS — D631 Anemia in chronic kidney disease: Secondary | ICD-10-CM | POA: Diagnosis not present

## 2019-10-14 DIAGNOSIS — B965 Pseudomonas (aeruginosa) (mallei) (pseudomallei) as the cause of diseases classified elsewhere: Secondary | ICD-10-CM | POA: Diagnosis not present

## 2019-10-14 DIAGNOSIS — E1122 Type 2 diabetes mellitus with diabetic chronic kidney disease: Secondary | ICD-10-CM | POA: Diagnosis not present

## 2019-10-14 DIAGNOSIS — N390 Urinary tract infection, site not specified: Secondary | ICD-10-CM | POA: Diagnosis not present

## 2019-10-14 DIAGNOSIS — K760 Fatty (change of) liver, not elsewhere classified: Secondary | ICD-10-CM | POA: Diagnosis not present

## 2019-10-14 DIAGNOSIS — M109 Gout, unspecified: Secondary | ICD-10-CM | POA: Diagnosis not present

## 2019-10-14 DIAGNOSIS — I5022 Chronic systolic (congestive) heart failure: Secondary | ICD-10-CM | POA: Diagnosis not present

## 2019-10-14 DIAGNOSIS — I132 Hypertensive heart and chronic kidney disease with heart failure and with stage 5 chronic kidney disease, or end stage renal disease: Secondary | ICD-10-CM | POA: Diagnosis not present

## 2019-10-14 DIAGNOSIS — T83511A Infection and inflammatory reaction due to indwelling urethral catheter, initial encounter: Secondary | ICD-10-CM | POA: Diagnosis not present

## 2019-10-15 DIAGNOSIS — N186 End stage renal disease: Secondary | ICD-10-CM | POA: Diagnosis not present

## 2019-10-15 DIAGNOSIS — Z992 Dependence on renal dialysis: Secondary | ICD-10-CM | POA: Diagnosis not present

## 2019-10-15 DIAGNOSIS — I129 Hypertensive chronic kidney disease with stage 1 through stage 4 chronic kidney disease, or unspecified chronic kidney disease: Secondary | ICD-10-CM | POA: Diagnosis not present

## 2019-10-16 DIAGNOSIS — Z992 Dependence on renal dialysis: Secondary | ICD-10-CM | POA: Diagnosis not present

## 2019-10-16 DIAGNOSIS — D689 Coagulation defect, unspecified: Secondary | ICD-10-CM | POA: Diagnosis not present

## 2019-10-16 DIAGNOSIS — D509 Iron deficiency anemia, unspecified: Secondary | ICD-10-CM | POA: Diagnosis not present

## 2019-10-16 DIAGNOSIS — E1129 Type 2 diabetes mellitus with other diabetic kidney complication: Secondary | ICD-10-CM | POA: Diagnosis not present

## 2019-10-16 DIAGNOSIS — R52 Pain, unspecified: Secondary | ICD-10-CM | POA: Diagnosis not present

## 2019-10-16 DIAGNOSIS — N2581 Secondary hyperparathyroidism of renal origin: Secondary | ICD-10-CM | POA: Diagnosis not present

## 2019-10-16 DIAGNOSIS — N186 End stage renal disease: Secondary | ICD-10-CM | POA: Diagnosis not present

## 2019-10-16 DIAGNOSIS — D631 Anemia in chronic kidney disease: Secondary | ICD-10-CM | POA: Diagnosis not present

## 2019-10-20 DIAGNOSIS — K760 Fatty (change of) liver, not elsewhere classified: Secondary | ICD-10-CM | POA: Diagnosis not present

## 2019-10-20 DIAGNOSIS — N186 End stage renal disease: Secondary | ICD-10-CM | POA: Diagnosis not present

## 2019-10-20 DIAGNOSIS — I5022 Chronic systolic (congestive) heart failure: Secondary | ICD-10-CM | POA: Diagnosis not present

## 2019-10-20 DIAGNOSIS — I132 Hypertensive heart and chronic kidney disease with heart failure and with stage 5 chronic kidney disease, or end stage renal disease: Secondary | ICD-10-CM | POA: Diagnosis not present

## 2019-10-20 DIAGNOSIS — E1122 Type 2 diabetes mellitus with diabetic chronic kidney disease: Secondary | ICD-10-CM | POA: Diagnosis not present

## 2019-10-20 DIAGNOSIS — D631 Anemia in chronic kidney disease: Secondary | ICD-10-CM | POA: Diagnosis not present

## 2019-10-20 DIAGNOSIS — B965 Pseudomonas (aeruginosa) (mallei) (pseudomallei) as the cause of diseases classified elsewhere: Secondary | ICD-10-CM | POA: Diagnosis not present

## 2019-10-20 DIAGNOSIS — N390 Urinary tract infection, site not specified: Secondary | ICD-10-CM | POA: Diagnosis not present

## 2019-10-20 DIAGNOSIS — T83511A Infection and inflammatory reaction due to indwelling urethral catheter, initial encounter: Secondary | ICD-10-CM | POA: Diagnosis not present

## 2019-10-20 DIAGNOSIS — M109 Gout, unspecified: Secondary | ICD-10-CM | POA: Diagnosis not present

## 2019-10-25 ENCOUNTER — Ambulatory Visit (INDEPENDENT_AMBULATORY_CARE_PROVIDER_SITE_OTHER): Payer: Medicare HMO | Admitting: *Deleted

## 2019-10-25 DIAGNOSIS — I5022 Chronic systolic (congestive) heart failure: Secondary | ICD-10-CM

## 2019-10-25 DIAGNOSIS — M109 Gout, unspecified: Secondary | ICD-10-CM | POA: Diagnosis not present

## 2019-10-25 DIAGNOSIS — I429 Cardiomyopathy, unspecified: Secondary | ICD-10-CM

## 2019-10-25 DIAGNOSIS — I132 Hypertensive heart and chronic kidney disease with heart failure and with stage 5 chronic kidney disease, or end stage renal disease: Secondary | ICD-10-CM | POA: Diagnosis not present

## 2019-10-25 DIAGNOSIS — B965 Pseudomonas (aeruginosa) (mallei) (pseudomallei) as the cause of diseases classified elsewhere: Secondary | ICD-10-CM | POA: Diagnosis not present

## 2019-10-25 DIAGNOSIS — K760 Fatty (change of) liver, not elsewhere classified: Secondary | ICD-10-CM | POA: Diagnosis not present

## 2019-10-25 DIAGNOSIS — N186 End stage renal disease: Secondary | ICD-10-CM | POA: Diagnosis not present

## 2019-10-25 DIAGNOSIS — N390 Urinary tract infection, site not specified: Secondary | ICD-10-CM | POA: Diagnosis not present

## 2019-10-25 DIAGNOSIS — T83511A Infection and inflammatory reaction due to indwelling urethral catheter, initial encounter: Secondary | ICD-10-CM | POA: Diagnosis not present

## 2019-10-25 DIAGNOSIS — D631 Anemia in chronic kidney disease: Secondary | ICD-10-CM | POA: Diagnosis not present

## 2019-10-25 DIAGNOSIS — E1122 Type 2 diabetes mellitus with diabetic chronic kidney disease: Secondary | ICD-10-CM | POA: Diagnosis not present

## 2019-10-25 LAB — CUP PACEART REMOTE DEVICE CHECK
Battery Remaining Longevity: 18 mo
Battery Remaining Percentage: 20 %
Brady Statistic RA Percent Paced: 0 %
Brady Statistic RV Percent Paced: 0 %
Date Time Interrogation Session: 20210510023100
HighPow Impedance: 51 Ohm
Implantable Lead Implant Date: 20091218
Implantable Lead Implant Date: 20091218
Implantable Lead Location: 753859
Implantable Lead Location: 753860
Implantable Lead Model: 158
Implantable Lead Serial Number: 131093
Implantable Pulse Generator Implant Date: 20091218
Lead Channel Impedance Value: 411 Ohm
Lead Channel Impedance Value: 453 Ohm
Lead Channel Pacing Threshold Amplitude: 0.6 V
Lead Channel Pacing Threshold Amplitude: 0.8 V
Lead Channel Pacing Threshold Pulse Width: 0.5 ms
Lead Channel Pacing Threshold Pulse Width: 0.5 ms
Lead Channel Setting Pacing Amplitude: 2 V
Lead Channel Setting Pacing Amplitude: 2.4 V
Lead Channel Setting Pacing Pulse Width: 0.5 ms
Lead Channel Setting Sensing Sensitivity: 0.6 mV
Pulse Gen Serial Number: 131093

## 2019-10-26 NOTE — Progress Notes (Signed)
Remote ICD transmission.   

## 2019-11-01 ENCOUNTER — Telehealth: Payer: Medicare HMO

## 2019-11-01 ENCOUNTER — Telehealth: Payer: Self-pay

## 2019-11-01 NOTE — Telephone Encounter (Signed)
  Chronic Care Management   Outreach Note  11/01/2019 Name: Tyler Hahn MRN: [id number] DOB: Dec 11, 1940  Referred by: [name], MD Reason for referral: Telephone Appointment with [place] Pharmacist, [name].    An unsuccessful telephone outreach was attempted today. The patient was referred to the pharmacist for assistance with care management and care coordination.    Telephone appointment with clinical pharmacist today at 2pm. If patient returns call shortly after scheduled visit, please transfer to ext [phone]. Otherwise please provide number below for patient to reschedule visit.   [name], Pharm.D., BCGP Clinical Pharmacist [facility] [phone]

## 2019-11-01 NOTE — Progress Notes (Incomplete)
Chronic Care Management Pharmacy  Name: Tyler Hahn  MRN: [id number] DOB: 1941/01/21  Chief Complaint/ HPI  Tyler Hahn,  79 y.o. , male presents for their Initial CCM visit with the clinical pharmacist via telephone due to COVID-19 Pandemic.  PCP : Tyler Minium, MD  Their chronic conditions include:  Encounter Diagnoses  Name Primary?  . Essential hypertension Yes  . SYSTOLIC HEART FAILURE, CHRONIC   . 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[place])   . ESRD (end stage renal disease) ([place])   . Hyperlipidemia, unspecified hyperlipidemia type    . Office Visits:  4/5 (PCP): gout pain, prednisone taper. Chronic ULT with allopurinol 100 mg daily, no colchicine. 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 Donnetta Hutching. Patient reports using graft and it is working well.  4/7 (Renee Dyane Dustman, Cardiology): Notes ARM ER 07/20/2019 due to weakness, possible tremulous however no LOC while receiving HD, mildly hypokalemic, serum K+ was 2.9 dialysate changed to K 3.0 stopped coreg 2/2 due to low Bps. Pseydinibas aeruginosa bacteremia 3/16, negative 3/19.   Patient Active Problem List   Diagnosis Date Noted  . Bacteremia due to Pseudomonas   . Urinary tract infection associated with indwelling urethral catheter ([place])   . Acute lower UTI   . Elevated BUN   . Weakness   . ESRD (end stage renal disease) ([place]) 08/30/2019  . Acute renal failure (ARF) ([place]) 08/18/2019  . History of anemia due to CKD 08/18/2019  . Hyperkalemia 08/17/2019  . Hypothyroid 08/16/2019  . Physical exam 02/18/2019  . Greater trochanteric bursitis of left hip 08/04/2018  . Osteoarthritis 09/25/2016  . Screen for colon cancer   . Benign neoplasm of transverse colon   . Edema 07/28/2014  . Elevated LFTs 07/28/2014  . Adjustment disorder with depressed mood 03/24/2014  . Epigastric pain 01/27/2014  . Small bowel  mass 12/20/2013  . Abdominal pain, left upper quadrant 11/25/2013  . Myalgia 07/07/2013  . Loss of weight 04/19/2013  . Polyarthralgia 04/19/2013  . Loss of appetite 04/19/2013  . 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 Right elbow pain 08/17/2012  . 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 SYSTOLIC HEART FAILURE, CHRONIC 06/07/2010  . Chronic kidney disease (CKD), stage IV (severe) ([place]) 04/04/2010  . TESTICULAR MASS 04/04/2010  . 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[place]) 03/07/2010  . Hyperlipidemia 03/07/2010  . GOUT 03/07/2010  . Essential hypertension 03/07/2010  . IMPLANTABLE CARDIAC DEFIBRILLATOR -[employer]-SINGLE 03/07/2010   Social History   Socioeconomic History  . Marital status: Married    Spouse name: Not on file  . Number of children: 4  . Years of education: Not on file  . Highest education level: Not on file  Occupational History  . Occupation: Retired    Fish farm manager: RETIRED  Tobacco Use  . Smoking status: Former Smoker    Types: Cigarettes    Quit date: 11/26/1998    Years since quitting: 20.9  . Smokeless tobacco: Never Used  Substance and Sexual Activity  . Alcohol use: No    Alcohol/week: 0.0 standard drinks  . Drug use: No  . Sexual activity: Not on file  Other Topics Concern  . Not on file  Social History Narrative  . Not on file   Social Determinants of Health   Financial Resource Strain:   . Difficulty of Paying Living Expenses:  Food Insecurity:   . Worried About Charity fundraiser in the Last Year:   . Arboriculturist in the Last Year:   Transportation Needs:   . Film/video editor (Medical):   Marland Kitchen Lack of Transportation (Non-Medical):   Physical Activity:   . Days of Exercise per Week:   . Minutes of Exercise per Session:   Stress:   . Feeling of Stress :   Social Connections:   . Frequency of Communication with Friends and Family:   . Frequency of Social Gatherings with Friends and Family:     . Attends Religious Services:   . Active Member of Clubs or Organizations:   . Attends Archivist Meetings:   Marland Kitchen Marital Status:    Family History  Problem Relation Age of Onset  . Stomach cancer Mother   . Alzheimer's disease Mother   . Diabetes Father   . Heart disease Father   . Liver disease Father   . Kidney disease Father    Allergies  Allergen Reactions  . Sulfonamide Derivatives Itching and Rash    Outpatient Encounter Medications as of 11/01/2019  Medication Sig  . allopurinol (ZYLOPRIM) 100 MG tablet Take 1 tablet (100 mg total) by mouth daily.  Marland Kitchen atorvastatin (LIPITOR) 20 MG tablet Take 1 tablet (20 mg total) by mouth daily.  . Cholecalciferol (VITAMIN D3) 5000 units CAPS Take 1 capsule by mouth daily.  . finasteride (PROSCAR) 5 MG tablet Take 1 tablet (5 mg total) by mouth daily.  Marland Kitchen levothyroxine (SYNTHROID) 25 MCG tablet Take 1 tablet (25 mcg total) by mouth daily before breakfast.  . multivitamin (RENA-VIT) TABS tablet Take 1 tablet by mouth at bedtime.  . Nutritional Supplements (FEEDING SUPPLEMENT, NEPRO CARB STEADY,) LIQD Take 237 mLs by mouth 2 (two) times daily between meals.  . predniSONE (DELTASONE) 10 MG tablet 3 tabs x3 days and then 2 tabs x3 days and then 1 tab x3 days.  Take w/ food.  . sertraline (ZOLOFT) 100 MG tablet Take 1 tablet (100 mg total) by mouth daily.   No facility-administered encounter medications on file as of 11/01/2019.   Current Diagnosis/Assessment: Goals Addressed   None    Hypertension / ESRD   BP today is:  {CHL HP UPSTREAM Pharmacist BP ranges:[phone]}  Office blood pressures are  BP Readings from Last 3 Encounters:  09/29/19 133/68  09/22/19 (!) 122/54  09/11/19 112/61   Kidney Function Lab Results  Component Value Date/Time   CREATININE 2.63 (H) 09/11/2019 02:02 PM   CREATININE 5.12 (H) 09/03/2019 08:11 PM   CREATININE 3.63 (H) 07/04/2017 03:55 PM   GFR 10.07 (LL) 08/16/2019 02:24 PM   GFRNONAA 22  (L) 09/11/2019 02:02 PM   GFRAA 26 (L) 09/11/2019 02:02 PM   Patient has failed these meds in the past: ***  Patient checks BP at home {CHL HP BP Monitoring [phone]}  Patient home BP readings are ranging: ***  We discussed {CHL HP Upstream Pharmacy discussion:[phone]} Patient is currently {CHL Controlled/Uncontrolled:[phone]} on the following medications: ***  Plan  Continue {CHL HP Upstream Pharmacy Plans:[id number]}     Diabetes   Recent Relevant Labs: Lab Results  Component Value Date/Time   HGBA1C 6.8 (H) 08/16/2019 02:24 PM   HGBA1C 6.7 (H) 02/18/2019 03:07 PM    Lab Results  Component Value Date   GLUCOSE 77 09/11/2019   GLUCOSE 142 (H) 09/03/2019   GLUCOSE 82 09/01/2019   GLUCOSE 156 (H) 08/30/2019   GLUCOSE 144 (  H) 08/24/2019   Patient is currently {CHL Controlled/Uncontrolled:[phone]} on the following medications: ***  We discussed: {CHL HP Upstream Pharmacy [phone]}  Plan  Continue {CHL HP Upstream Pharmacy [phone]}   Hyperlipidemia   Lipid Panel     Component Value Date/Time   CHOL 146 08/16/2019 1424   TRIG 136.0 08/16/2019 1424   HDL 39.70 08/16/2019 1424   CHOLHDL 4 08/16/2019 1424   VLDL 27.2 08/16/2019 1424   LDLCALC 79 08/16/2019 1424   LDLCALC 83 07/04/2017 1555   LDLDIRECT 97.0 06/18/2018 1531     The 10-year ASCVD risk score Mikey Bussing DC Jr., et al., 2013) is: 27.1%   Values used to calculate the score:     Age: 75 years     Sex: Male     Is Non-Hispanic African American: Yes     Diabetic: Yes     Tobacco smoker: No     Systolic Blood Pressure: 518 mmHg     Is BP treated: No     HDL Cholesterol: 39.7 mg/dL     Total Cholesterol: 146 mg/dL   Patient has failed these meds in past: *** Patient is currently {CHL Controlled/Uncontrolled:[phone]} on the following medications: ***  We discussed:  {CHL HP Upstream Pharmacy [phone]}  Plan  Continue {CHL HP Upstream Pharmacy  [phone]} Hypothyroidism   TSH  Date Value Ref Range Status  08/30/2019 4.183 0.350 - 4.500 uIU/mL Final    Comment:    Performed by a 3rd Generation assay with a functional sensitivity of <=0.01 uIU/mL. Performed at [facility], [address], [place], [place] [address]      Patient has failed these meds in past: *** Patient is currently {CHL Controlled/Uncontrolled:[phone]} on the following medications: ***  We discussed:  {CHL HP Upstream Pharmacy [phone]}  Plan  Continue {CHL HP Upstream Pharmacy [phone]}  BPH    Lab Results  Component Value Date   PSA 0.86 05/18/2015   PSA 0.46 08/15/2011   PSA 0.22 04/04/2010    Patient is currently {CHL Controlled/Uncontrolled:[phone]} on ***.  {denies_reports_expresses:23828} urinary symptoms {bph sx:315752} for *** {gen duration:315003}. {denies_reports_expresses:23828} side effects including dizziness.  Plan  Continue {CHL HP Upstream Pharmacy [phone]}. ***   Patient has failed these meds in past: *** Patient is currently {CHL Controlled/Uncontrolled:[phone]} on the following medications: ***  We discussed:  ***  Plan  Continue {CHL HP Upstream Pharmacy [phone]} ***   Patient has failed these meds in past: *** Patient is currently {CHL Controlled/Uncontrolled:[phone]} on the following medications: ***  We discussed:  ***  Plan  Continue {CHL HP Upstream Pharmacy AYTKZ:[id number]} Vaccines   Reviewed and discussed patient's vaccination history.    Immunization History  Administered Date(s) Administered  . Fluad Quad(high Dose 65+) 02/18/2019  . Influenza Whole 04/04/2010, 06/17/2012  . Influenza, High Dose Seasonal PF 03/18/2018  . Influenza,inj,Quad PF,6+ Mos 03/24/2014, 05/18/2015, 07/04/2017  . Pneumococcal Conjugate-13 02/14/2015  . Pneumococcal Polysaccharide-23 08/15/2011    Plan  Recommended patient receive *** vaccine in  *** office/pharmacy.  Medication Management   Receives prescription medications from:  [id number] [address], [place] [address] Phone: [phone] Fax: [phone]   ***  Plan  {US Pharmacy XTGG:26948}.  Follow up: *** month phone visit.  *** ______________ Visit Information SDOH (Social Determinants of Health) assessments performed: Yes.  Mr. Reali was given information about Chronic Care Management services today including:  1. CCM service includes personalized support from designated clinical  staff supervised by his physician, including individualized plan of care and coordination with other care providers 2. 24/7 contact phone numbers for assistance for urgent and routine care needs. 3. Standard insurance, coinsurance, copays and deductibles apply for chronic care management only during months in which we provide at least 20 minutes of these services. Most insurances cover these services at 100%, however patients may be responsible for any copay, coinsurance and/or deductible if applicable. This service may help you avoid the need for more expensive face-to-face services. 4. Only one practitioner may furnish and bill the service in a calendar month. 5. The patient may stop CCM services at any time (effective at the end of the month) by phone call to the office staff.  Patient agreed to services and verbal consent obtained.   [name], Pharm.D., BCGP Clinical Pharmacist [facility] at [facility] [phone]

## 2019-11-07 ENCOUNTER — Other Ambulatory Visit: Payer: Self-pay | Admitting: Family Medicine

## 2019-11-15 DIAGNOSIS — N186 End stage renal disease: Secondary | ICD-10-CM | POA: Diagnosis not present

## 2019-11-15 DIAGNOSIS — Z992 Dependence on renal dialysis: Secondary | ICD-10-CM | POA: Diagnosis not present

## 2019-11-15 DIAGNOSIS — I129 Hypertensive chronic kidney disease with stage 1 through stage 4 chronic kidney disease, or unspecified chronic kidney disease: Secondary | ICD-10-CM | POA: Diagnosis not present

## 2019-11-16 DIAGNOSIS — N2581 Secondary hyperparathyroidism of renal origin: Secondary | ICD-10-CM | POA: Diagnosis not present

## 2019-11-16 DIAGNOSIS — Z992 Dependence on renal dialysis: Secondary | ICD-10-CM | POA: Diagnosis not present

## 2019-11-16 DIAGNOSIS — Z111 Encounter for screening for respiratory tuberculosis: Secondary | ICD-10-CM | POA: Diagnosis not present

## 2019-11-16 DIAGNOSIS — N186 End stage renal disease: Secondary | ICD-10-CM | POA: Diagnosis not present

## 2019-11-16 DIAGNOSIS — D689 Coagulation defect, unspecified: Secondary | ICD-10-CM | POA: Diagnosis not present

## 2019-11-16 DIAGNOSIS — E1129 Type 2 diabetes mellitus with other diabetic kidney complication: Secondary | ICD-10-CM | POA: Diagnosis not present

## 2019-11-16 DIAGNOSIS — Z23 Encounter for immunization: Secondary | ICD-10-CM | POA: Diagnosis not present

## 2019-11-23 ENCOUNTER — Telehealth: Payer: Self-pay | Admitting: Family Medicine

## 2019-11-23 NOTE — Telephone Encounter (Signed)
Left message for patient to schedule Annual Wellness Visit.  Please schedule with Nurse Health Advisor Martha Stanley, RN at [place]  

## 2019-11-29 ENCOUNTER — Ambulatory Visit (INDEPENDENT_AMBULATORY_CARE_PROVIDER_SITE_OTHER): Payer: Medicare HMO | Admitting: *Deleted

## 2019-11-29 DIAGNOSIS — I429 Cardiomyopathy, unspecified: Secondary | ICD-10-CM

## 2019-11-29 LAB — CUP PACEART REMOTE DEVICE CHECK
Battery Remaining Longevity: 12 mo
Battery Remaining Percentage: 15 %
Brady Statistic RA Percent Paced: 0 %
Brady Statistic RV Percent Paced: 0 %
Date Time Interrogation Session: 20210614023100
HighPow Impedance: 56 Ohm
Implantable Lead Implant Date: 20091218
Implantable Lead Implant Date: 20091218
Implantable Lead Location: 753859
Implantable Lead Location: 753860
Implantable Lead Model: 158
Implantable Lead Serial Number: 131093
Implantable Pulse Generator Implant Date: 20091218
Lead Channel Impedance Value: 464 Ohm
Lead Channel Impedance Value: 536 Ohm
Lead Channel Pacing Threshold Amplitude: 0.6 V
Lead Channel Pacing Threshold Amplitude: 0.8 V
Lead Channel Pacing Threshold Pulse Width: 0.5 ms
Lead Channel Pacing Threshold Pulse Width: 0.5 ms
Lead Channel Setting Pacing Amplitude: 2 V
Lead Channel Setting Pacing Amplitude: 2.4 V
Lead Channel Setting Pacing Pulse Width: 0.5 ms
Lead Channel Setting Sensing Sensitivity: 0.6 mV
Pulse Gen Serial Number: 131093

## 2019-12-01 NOTE — Progress Notes (Signed)
Remote ICD transmission.   

## 2019-12-06 ENCOUNTER — Telehealth: Payer: Medicare HMO

## 2019-12-15 DIAGNOSIS — Z992 Dependence on renal dialysis: Secondary | ICD-10-CM | POA: Diagnosis not present

## 2019-12-15 DIAGNOSIS — I129 Hypertensive chronic kidney disease with stage 1 through stage 4 chronic kidney disease, or unspecified chronic kidney disease: Secondary | ICD-10-CM | POA: Diagnosis not present

## 2019-12-15 DIAGNOSIS — N186 End stage renal disease: Secondary | ICD-10-CM | POA: Diagnosis not present

## 2019-12-16 DIAGNOSIS — D631 Anemia in chronic kidney disease: Secondary | ICD-10-CM | POA: Diagnosis not present

## 2019-12-16 DIAGNOSIS — N186 End stage renal disease: Secondary | ICD-10-CM | POA: Diagnosis not present

## 2019-12-16 DIAGNOSIS — Z992 Dependence on renal dialysis: Secondary | ICD-10-CM | POA: Diagnosis not present

## 2019-12-16 DIAGNOSIS — N2581 Secondary hyperparathyroidism of renal origin: Secondary | ICD-10-CM | POA: Diagnosis not present

## 2019-12-16 DIAGNOSIS — Z23 Encounter for immunization: Secondary | ICD-10-CM | POA: Diagnosis not present

## 2019-12-16 DIAGNOSIS — E1129 Type 2 diabetes mellitus with other diabetic kidney complication: Secondary | ICD-10-CM | POA: Diagnosis not present

## 2019-12-16 DIAGNOSIS — D689 Coagulation defect, unspecified: Secondary | ICD-10-CM | POA: Diagnosis not present

## 2019-12-21 NOTE — Progress Notes (Signed)
Chronic Care Management Pharmacy  Name: Tyler Hahn  MRN: [id number] DOB: [birth year]  Chief Complaint/ HPI  Tyler Hahn,  80 y.o. , male presents for their Initial CCM visit with the clinical pharmacist via telephone due to COVID-19 Pandemic. Patient is joined by son, Tyler Hahn, via teleconference.   Reports to be doing well with dialysis and has been taken off of all blood pressure and diabetes related medications. Is not currently taking a phosphorus binder. Tu/Th/Sat HD, medications given after HD on treatment days.   Appetite has been consistent - successfully consuming three meals a day provided at [facility], given a nutritional supplement after dialysis.   Previously seen 09/2019 by PCP for gout and given prednisone taper. Reports gout has not been an issue since then.  Reports improved mood and overall energy, son mentions observing occasional disorientation/confusion following dialysis treatment.   PCP : Tyler Minium, MD  Chronic conditions include:  Encounter Diagnoses  Name Primary?   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[place]) Yes   Hyperlipidemia, unspecified hyperlipidemia type    Essential hypertension    Adjustment disorder with depressed mood     Office Visits:  09/20/2019 (PCP): concern of gout, in considerable pain. Prednisone taper started. Sertraline increased to 100 mg for depression.   Consult Visit: 09/29/2019 [facility], vasc.): s/p rua avg 09/02/2019 09/22/2019 Tyler Hahn, [place]): previous vtach most likely secondary to hypokalemia, hypotension with HD so off of coreg  Patient Active Problem List   Diagnosis Date Noted   Bacteremia due to Pseudomonas    Urinary tract infection associated with indwelling urethral catheter ([place])    Acute lower UTI    Elevated BUN    Weakness    ESRD (end stage renal disease) ([place]) 08/30/2019   Acute renal failure (ARF) ([place]) 08/18/2019   History of  anemia due to CKD 08/18/2019   Hyperkalemia 08/17/2019   Hypothyroid 08/16/2019   Physical exam 02/18/2019   Greater trochanteric bursitis of left hip 08/04/2018   Osteoarthritis 09/25/2016   Screen for colon cancer    Benign neoplasm of transverse colon    Edema 07/28/2014   Elevated LFTs 07/28/2014   Adjustment disorder with depressed mood 03/24/2014   Epigastric pain 01/27/2014   Small bowel mass 12/20/2013   Abdominal pain, left upper quadrant 11/25/2013   Myalgia 07/07/2013   Loss of weight 04/19/2013   Polyarthralgia 04/19/2013   Loss of appetite 04/19/2013   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   Right elbow pain 08/17/2012   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/28/4817   SYSTOLIC HEART FAILURE, CHRONIC 06/07/2010   Chronic kidney disease (CKD), stage IV (severe) ([place]) 04/04/2010   TESTICULAR MASS 04/04/2010   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[place]) 03/07/2010   Hyperlipidemia 03/07/2010   GOUT 03/07/2010   Essential hypertension 03/07/2010   IMPLANTABLE CARDIAC DEFIBRILLATOR -[employer]-SINGLE 03/07/2010   Past Surgical History:  Procedure Laterality Date   AV FISTULA PLACEMENT Right 08/23/2019   Procedure: ARTERIOVENOUS GORTEX GRAFT RIGHT ARM;  Surgeon: Rosetta Posner, MD;  Location: [facility] OR;  Service: Vascular;  Laterality: Right;   CARDIAC DEFIBRILLATOR PLACEMENT     COLONOSCOPY WITH PROPOFOL N/A 10/03/2015   Procedure: COLONOSCOPY WITH PROPOFOL;  Surgeon: Ladene Artist, MD;  Location: [facility] END[facility]COPY;  Service: Endoscopy;  Laterality: N/A;   DIAGNOSTIC LAPAROSCOPY  01/27/2014   Dr Brantley Stage   ENTEROSCOPY N/A 11/25/2013   Procedure: ENTEROSCOPY;  Surgeon: Ladene Artist,  MD;  Location: [facility];  Service: Endoscopy;  Laterality: N/A;   ICD,[place] [employer]     LAPAROSCOPY N/A 01/27/2014   Procedure: LAPAROSCOPY DIAGNOSTIC;  Surgeon: Joyice Faster. Cornett, MD;  Location: [place];  Service: General;  Laterality:  N/A;   polyp removal throat     difficulty speaking   Social History   Socioeconomic History   Marital status: Married    Spouse name: Not on file   Number of children: 4   Years of education: Not on file   Highest education level: Not on file  Occupational History   Occupation: Retired    Fish farm manager: RETIRED  Tobacco Use   Smoking status: Former Smoker    Types: Cigarettes    Quit date: 11/26/1998    Years since quitting: 21.0   Smokeless tobacco: Never Used  Vaping Use   Vaping Use: Never used  Substance and Sexual Activity   Alcohol use: No    Alcohol/week: 0.0 Hahn drinks   Drug use: No   Sexual activity: Not on file  Other Topics Concern   Not on file  Social History Narrative   Not on file   Social Determinants of Health   Financial Resource Strain:    Difficulty of Paying Living Expenses:   Food Insecurity:    Worried About Charity fundraiser in the Last Year:    Arboriculturist in the Last Year:   Transportation Needs: No Transportation Needs   Lack of Transportation (Medical): No   Lack of Transportation (Non-Medical): No  Physical Activity:    Days of Exercise per Week:    Minutes of Exercise per Session:   Stress:    Feeling of Stress :   Social Connections:    Frequency of Communication with Friends and Family:    Frequency of Social Gatherings with Friends and Family:    Attends Religious Services:    Active Member of Clubs or Organizations:    Attends Music therapist:    Marital Status:    Family History  Problem Relation Age of Onset   Stomach cancer Mother    Alzheimer's disease Mother    Diabetes Father    Heart disease Father    Liver disease Father    Kidney disease Father    Allergies  Allergen Reactions   Sulfonamide Derivatives Itching and Rash   Outpatient Encounter Medications as of 12/22/2019  Medication Sig   allopurinol (ZYLOPRIM) 100 MG tablet Take 1 tablet (100 mg  total) by mouth daily.   atorvastatin (LIPITOR) 20 MG tablet Take 1 tablet (20 mg total) by mouth daily.   finasteride (PROSCAR) 5 MG tablet Take 1 tablet (5 mg total) by mouth daily.   levothyroxine (SYNTHROID) 25 MCG tablet TAKE 1 TABLET BY MOUTH EVERY DAY BEFORE BREAKFAST   multivitamin (RENA-VIT) TABS tablet Take 1 tablet by mouth at bedtime.   Nutritional Supplements (FEEDING SUPPLEMENT, NEPRO CARB STEADY,) LIQD Take 237 mLs by mouth 2 (two) times daily between meals.   sertraline (ZOLOFT) 100 MG tablet Take 1 tablet (100 mg total) by mouth daily.   Cholecalciferol (VITAMIN D3) 5000 units CAPS Take 1 capsule by mouth daily. (Patient not taking: Reported on 12/22/2019)   predniSONE (DELTASONE) 10 MG tablet 3 tabs x3 days and then 2 tabs x3 days and then 1 tab x3 days.  Take w/ food. (Patient not taking: Reported on 12/22/2019)   No facility-administered encounter medications on file as of 12/22/2019.  Patient Care Team    Relationship Specialty Notifications Start End  Tyler Minium, MD PCP - General   05/30/10   [name], MD Consulting Physician Cardiology  02/14/15   Renato Shin, MD Consulting Physician Endocrinology  02/14/15   Ladene Artist, MD Consulting Physician Gastroenterology  05/18/15   Rosita Fire, MD Consulting Physician Nephrology  08/17/19   [name], [facility] Pharmacist Pharmacist Admissions 09/30/19    Comment: PHONE NUMBER [phone]   Current Diagnosis/Assessment: Goals Addressed            This Visit's Progress    PharmD Care Plan       CARE PLAN ENTRY (see longitudinal plan of care for additional care plan information)  Current Barriers:   Chronic Disease Management support, education, and care coordination needs related to Hypertension, Hyperlipidemia, Diabetes, Depression.    Hypertension BP Readings from Last 3 Encounters:  09/29/19 133/68  09/22/19 (!) 122/54  09/11/19 112/61    Pharmacist Clinical Goal(s): o Over  the next 180 days, patient will work with PharmD and providers to maintain BP goal <140/90  Current regimen:  o No medications at this time o HD tu/th/sat  Interventions: o Continue current management  Patient self care activities - Over the next 180 days, patient will: o Check BP as directed, document, and provide at future appointments o Ensure daily salt intake < 2300 mg/day  Hyperlipidemia Lab Results  Component Value Date/Time   LDLCALC 79 08/16/2019 02:24 PM   [place] 83 07/04/2017 03:55 PM   LDLDIRECT 97.0 06/18/2018 03:31 PM    Pharmacist Clinical Goal(s): o Over the next 180 days, patient will work with PharmD and providers to maintain LDL goal < 100  Current regimen:  o Atorvastatin 20 mg once daily   Interventions: o Continue current management  Patient self care activities - Over the next 180 days, patient will: o Continue current management Diabetes Lab Results  Component Value Date/Time   HGBA1C 6.8 (H) 08/16/2019 02:24 PM   HGBA1C 6.7 (H) 02/18/2019 03:07 PM    Pharmacist Clinical Goal(s): o Over the next 180 days, patient will work with PharmD and providers to maintain A1c goal <7%  Current regimen:  o No diabetes medications at this time   Interventions: o Continue current management  Patient self care activities - Over the next 180 days, patient will: o Check blood sugar as directed, document, and provide at future appointments o Contact provider with any episodes of hypoglycemia  Gout  Pharmacist Clinical Goal(s) o Over the next 180 days, patient will work with PharmD and providers to minimize symptoms of gout   Current regimen:  o Allopurinol 100 mg once daily   Interventions: o Continue current management  Patient self care activities - Over the next 180 days, patient will: o Continue current management  Medication management  Pharmacist Clinical Goal(s): o Over the next 180 days, patient will work with PharmD and providers to  maintain optimal medication adherence  [facility]   Interventions o Comprehensive medication review performed. o Continue current medication management strategy  Patient self care activities - Over the next 180 days, patient will: o Focus on medication adherence by Continue current management o Take medications as prescribed o Report any questions or concerns to PharmD and/or provider(s)  Initial goal documentation.      Hypertension   BP Goal <140/90   BP Readings from Last 3 Encounters:  09/29/19 133/68  09/22/19 (!) 122/54  09/11/19 112/61   BP well controlled on recent readings. Denies dizziness, SOB, chest pain. Patient is currently controlled on the following medications:   No current medications for blood pressure. Thrice weekly HD  We discussed: signs of low BP.  Plan  Continue current management.     Diabetes   A1c goal < 7%  Lab Results  Component Value Date/Time   HGBA1C 6.8 (H) 08/16/2019 02:24 PM   HGBA1C 6.7 (H) 02/18/2019 03:07 PM    Taken off of basal insulin. a1c <7% on 08/2019 and 02/2019 readings. Patient is currently controlled on the following medications:   No current medications for diabetes   Plan  Continue current medications   Hyperlipidemia   LDL goal < 100  Lipid Panel     Component Value Date/Time   CHOL 146 08/16/2019 1424   TRIG 136.0 08/16/2019 1424   HDL 39.70 08/16/2019 1424   LDLCALC 79 08/16/2019 1424   LDLCALC 83 07/04/2017 1555   LDLDIRECT 97.0 06/18/2018 1531    Hepatic Function Latest Ref Rng & Units 09/11/2019 09/03/2019 09/01/2019  Total Protein 6.5 - 8.1 g/dL 6.7 - -  Albumin 3.5 - 5.0 g/dL 3.1(L) 3.1(L) 3.1(L)  AST 15 - 41 U/L 33 - -  ALT 0 - 44 U/L 18 - -  Alk Phosphatase 38 - 126 U/L 45 - -  Total Bilirubin 0.3 - 1.2 mg/dL 0.8 - -  Bilirubin, Direct 0.0 - 0.3 mg/dL - - -    The 10-year ASCVD risk score Mikey Bussing DC Jr., et al., 2013) is: 41.3%   Values used to calculate the  score:     Age: 59 years     Sex: Male     Is Non-Hispanic African American: Yes     Diabetic: Yes     Tobacco smoker: No     Systolic Blood Pressure: 244 mmHg     Is BP treated: Yes     HDL Cholesterol: 39.7 mg/dL     Total Cholesterol: 146 mg/dL   LDL at goal. Denies any muscle or abdominal pain or n/v. Patient is currently controlled on the following medications:   Atorvastatin 20 mg daily   We discussed:  Diet extensively.   Plan  Continue current medications   Hypothyroidism   Lab Results  Component Value Date/Time   TSH 4.183 08/30/2019 04:02 PM   TSH 2.91 08/16/2019 02:24 PM   TSH 5.47 (H) 02/18/2019 03:07 PM   FREET4 0.91 08/30/2019 04:02 PM   FREET4 0.68 09/18/2015 01:28 PM   Previous fluctuations in TSH, within goal 08/2019. Not currently on phosphorus binder.  Patient is currently controlled on the following medications:   Levothyroxine 25 mcg daily   We discussed:  Proper administration of levothyroxine.   Plan  Continue current medications  Gout   Seen 09/2019 for gout, has not had an episode since. Patient is currently controlled on the following medications:   Allopurinol 100 mg daily   We discussed:  Counseled on allopurinol.   Plan  Continue current medications.  Recommend decreasing allopurinol to 100 mg thrice weekly post HD if gout controlled at follow-up visit.    Vaccines   Reviewed and discussed patient's vaccination history.    Immunization History  Administered Date(s) Administered   Fluad Quad(high Dose 65+) 02/18/2019   Influenza Whole 04/04/2010, 06/17/2012   Influenza, High Dose Seasonal PF 03/18/2018   Influenza,inj,Quad PF,6+ Mos 03/24/2014, 05/18/2015, 07/04/2017   Pneumococcal Conjugate-13 02/14/2015   Pneumococcal Polysaccharide-23 08/15/2011  Reports receiving COVID vaccines at [facility]. Tdap received 06/2017. Up to date.  Plan  No recommendations at this time.   Depression   PHQ 2/9: 0, reports mood  has been great following sertraline increase to 100 mg.  Denies any current side effects. Patient is currently controlled on the following medications:   Sertraline 100 mg once daily   Plan  Continue current medications  Medication Management   [id number] [place]: [phone]: [phone] [id number] [place] [address] [facility] [facility] [place]Phone: [phone]: [phone]  Current mail order pill packaging. Denies any issues with current medication management.   Plan  Continue current medication management strategy.  Follow up: 3 month phone visit.  Gout, Depression ______________ Visit Information SDOH (Social Determinants of Health) assessments performed: Yes.  Mr. SMesawas given information about Chronic Care Management services today including:  1. CCM service includes personalized support from designated clinical staff supervised by his physician, including individualized plan of care and coordination with other care providers 2. 24/7 contact phone numbers for assistance for urgent and routine care needs. 3. Hahn insurance, coinsurance, copays and deductibles apply for chronic care management only during months in which we provide at least 20 minutes of these services. Most insurances cover these services at 100%, however patients may be responsible for any copay, coinsurance and/or deductible if applicable. This service may help you avoid the need for more expensive face-to-face services. 4. Only one practitioner may furnish and bill the service in a calendar month. 5. The patient may stop CCM services at any time (effective at the end of the month) by phone call to the office staff.  Patient agreed to services and verbal consent obtained.    JMadelin Rear Pharm.D., BCGP Clinical Pharmacist [facility]([phone]

## 2019-12-22 ENCOUNTER — Ambulatory Visit: Payer: Medicare HMO

## 2019-12-22 DIAGNOSIS — N186 End stage renal disease: Secondary | ICD-10-CM

## 2019-12-22 DIAGNOSIS — I10 Essential (primary) hypertension: Secondary | ICD-10-CM

## 2019-12-22 DIAGNOSIS — F4321 Adjustment disorder with depressed mood: Secondary | ICD-10-CM

## 2019-12-22 DIAGNOSIS — E785 Hyperlipidemia, unspecified: Secondary | ICD-10-CM

## 2019-12-22 DIAGNOSIS — E1122 Type 2 diabetes mellitus with diabetic chronic kidney disease: Secondary | ICD-10-CM

## 2019-12-22 NOTE — Patient Instructions (Addendum)
Please call me at [phone] (direct line) with any questions - thank you!  - Edyth Gunnels., Clinical Pharmacist  Goals Addressed            This Visit's Progress   . PharmD Care Plan       CARE PLAN ENTRY (see longitudinal plan of care for additional care plan information)  Current Barriers:  . Chronic Disease Management support, education, and care coordination needs related to Hypertension, Hyperlipidemia, Diabetes, Depression, and Gout.    Hypertension  BP Readings from Last 3 Encounters:  09/29/19 133/68  09/22/19 (!) 122/54  09/11/19 112/61   . Pharmacist Clinical Goal(s): o Over the next 180 days, patient will work with PharmD and providers to maintain BP goal <140/90 . Current regimen:  o No medications at this time o HD tu/th/sat . Interventions: o Continue current management . Patient self care activities - Over the next 180 days, patient will: o Check BP as directed, document, and provide at future appointments o Ensure daily salt intake < 2300 mg/day  Hyperlipidemia Lab Results  Component Value Date/Time   LDLCALC 79 08/16/2019 02:24 PM   [place] 83 07/04/2017 03:55 PM   LDLDIRECT 97.0 06/18/2018 03:31 PM   . Pharmacist Clinical Goal(s): o Over the next 180 days, patient will work with PharmD and providers to maintain LDL goal < 100 . Current regimen:  o Atorvastatin 20 mg once daily  . Interventions: o Continue current management . Patient self care activities - Over the next 180 days, patient will: o Continue current management Diabetes Lab Results  Component Value Date/Time   HGBA1C 6.8 (H) 08/16/2019 02:24 PM   HGBA1C 6.7 (H) 02/18/2019 03:07 PM   . Pharmacist Clinical Goal(s): o Over the next 180 days, patient will work with PharmD and providers to maintain A1c goal <7% . Current regimen:  o No diabetes medications at this time  . Interventions: o Continue current management . Patient self care activities - Over the next 180 days, patient  will: o Check blood sugar as directed, document, and provide at future appointments o Contact provider with any episodes of hypoglycemia  Gout . Pharmacist Clinical Goal(s) o Over the next 180 days, patient will work with PharmD and providers to minimize symptoms of gout  . Current regimen:  o Allopurinol 100 mg once daily  . Interventions: o Continue current management o Possibly reduce dose if gout controlled at follow-up visit.  . Patient self care activities - Over the next 180 days, patient will: o Continue current management  Gout . Pharmacist Clinical Goal(s) o Over the next 180 days, patient will work with PharmD and providers to minimize symptoms of depression . Current regimen:  o Sertraline 100 mg once daily . Interventions: o Continue current management . Patient self care activities - Over the next 180 days, patient will: o Continue current management  Medication management . Pharmacist Clinical Goal(s): o Over the next 180 days, patient will work with PharmD and providers to maintain optimal medication adherence . Current pharmacy: [facility]  . Interventions o Comprehensive medication review performed. o Continue current medication management strategy . Patient self care activities - Over the next 180 days, patient will: o Focus on medication adherence by Continue current management o Take medications as prescribed o Report any questions or concerns to PharmD and/or provider(s)  Initial goal documentation.      [name] was given information about Chronic Care Management services today including:  1. CCM service  includes personalized support from designated clinical staff supervised by his physician, including individualized plan of care and coordination with other care providers 2. 24/7 contact phone numbers for assistance for urgent and routine care needs. 3. Standard insurance, coinsurance, copays and deductibles apply for chronic care  management only during months in which we provide at least 20 minutes of these services. Most insurances cover these services at 100%, however patients may be responsible for any copay, coinsurance and/or deductible if applicable. This service may help you avoid the need for more expensive face-to-face services. 4. Only one practitioner may furnish and bill the service in a calendar month. 5. The patient may stop CCM services at any time (effective at the end of the month) by phone call to the office staff.  Patient agreed to services and verbal consent obtained.   The patient verbalized understanding of instructions provided today and agreed to receive a mailed copy of patient instruction and/or educational materials. Telephone follow up appointment with pharmacy team member scheduled for: See next appointment with "Care Management Staff" under "What's Next" below.   Thank you!  [name], Pharm.D., BCGP Clinical Pharmacist [facility] [phone]  Diabetes Mellitus and Nutrition, Adult When you have diabetes (diabetes mellitus), it is very important to have healthy eating habits because your blood sugar (glucose) levels are greatly affected by what you eat and drink. Eating healthy foods in the appropriate amounts, at about the same times every day, can help you:  Control your blood glucose.  Lower your risk of heart disease.  Improve your blood pressure.  Reach or maintain a healthy weight. Every person with diabetes is different, and each person has different needs for a meal plan. Your health care provider may recommend that you work with a diet and nutrition specialist (dietitian) to make a meal plan that is best for you. Your meal plan may vary depending on factors such as:  The calories you need.  The medicines you take.  Your weight.  Your blood glucose, blood pressure, and cholesterol levels.  Your activity level.  Other health  conditions you have, such as heart or kidney disease. How do carbohydrates affect me? Carbohydrates, also called carbs, affect your blood glucose level more than any other type of food. Eating carbs naturally raises the amount of glucose in your blood. Carb counting is a method for keeping track of how many carbs you eat. Counting carbs is important to keep your blood glucose at a healthy level, especially if you use insulin or take certain oral diabetes medicines. It is important to know how many carbs you can safely have in each meal. This is different for every person. Your dietitian can help you calculate how many carbs you should have at each meal and for each snack. Foods that contain carbs include:  Bread, cereal, rice, pasta, and crackers.  Potatoes and corn.  Peas, beans, and lentils.  Milk and yogurt.  Fruit and juice.  Desserts, such as cakes, cookies, ice cream, and candy. How does alcohol affect me? Alcohol can cause a sudden decrease in blood glucose (hypoglycemia), especially if you use insulin or take certain oral diabetes medicines. Hypoglycemia can be a life-threatening condition. Symptoms of hypoglycemia (sleepiness, dizziness, and confusion) are similar to symptoms of having too much alcohol. If your health care provider says that alcohol is safe for you, follow these guidelines:  Limit alcohol intake to no more than 1 drink per day for nonpregnant women  and 2 drinks per day for men. One drink equals 12 oz of beer, 5 oz of wine, or 1 oz of hard liquor.  Do not drink on an empty stomach.  Keep yourself hydrated with water, diet soda, or unsweetened iced tea.  Keep in mind that regular soda, juice, and other mixers may contain a lot of sugar and must be counted as carbs. What are tips for following this plan?  Reading food labels  Start by checking the serving size on the "Nutrition Facts" label of packaged foods and drinks. The amount of calories, carbs, fats, and  other nutrients listed on the label is based on one serving of the item. Many items contain more than one serving per package.  Check the total grams (g) of carbs in one serving. You can calculate the number of servings of carbs in one serving by dividing the total carbs by 15. For example, if a food has 30 g of total carbs, it would be equal to 2 servings of carbs.  Check the number of grams (g) of saturated and trans fats in one serving. Choose foods that have low or no amount of these fats.  Check the number of milligrams (mg) of salt (sodium) in one serving. Most people should limit total sodium intake to less than 2,300 mg per day.  Always check the nutrition information of foods labeled as "low-fat" or "nonfat". These foods may be higher in added sugar or refined carbs and should be avoided.  Talk to your dietitian to identify your daily goals for nutrients listed on the label. Shopping  Avoid buying canned, premade, or processed foods. These foods tend to be high in fat, sodium, and added sugar.  Shop around the outside edge of the grocery store. This includes fresh fruits and vegetables, bulk grains, fresh meats, and fresh dairy. Cooking  Use low-heat cooking methods, such as baking, instead of high-heat cooking methods like deep frying.  Cook using healthy oils, such as olive, canola, or sunflower oil.  Avoid cooking with butter, cream, or high-fat meats. Meal planning  Eat meals and snacks regularly, preferably at the same times every day. Avoid going long periods of time without eating.  Eat foods high in fiber, such as fresh fruits, vegetables, beans, and whole grains. Talk to your dietitian about how many servings of carbs you can eat at each meal.  Eat 4-6 ounces (oz) of lean protein each day, such as lean meat, chicken, fish, eggs, or tofu. One oz of lean protein is equal to: ? 1 oz of meat, chicken, or fish. ? 1 egg. ?  cup of tofu.  Eat some foods each day that  contain healthy fats, such as avocado, nuts, seeds, and fish. Lifestyle  Check your blood glucose regularly.  Exercise regularly as told by your health care provider. This may include: ? 150 minutes of moderate-intensity or vigorous-intensity exercise each week. This could be brisk walking, biking, or water aerobics. ? Stretching and doing strength exercises, such as yoga or weightlifting, at least 2 times a week.  Take medicines as told by your health care provider.  Do not use any products that contain nicotine or tobacco, such as cigarettes and e-cigarettes. If you need help quitting, ask your health care provider.  Work with a Social worker or diabetes educator to identify strategies to manage stress and any emotional and social challenges. Questions to ask a health care provider  Do I need to meet with a diabetes educator?  Do I need to meet with a dietitian?  What number can I call if I have questions?  When are the best times to check my blood glucose? Where to find more information:  American Diabetes Association: diabetes.org  Academy of Nutrition and Dietetics: www.eatright.[employer] of Diabetes and Digestive and Kidney Diseases (NIH): [employer] Summary  A healthy meal plan will help you control your blood glucose and maintain a healthy lifestyle.  Working with a diet and nutrition specialist (dietitian) can help you make a meal plan that is best for you.  Keep in mind that carbohydrates (carbs) and alcohol have immediate effects on your blood glucose levels. It is important to count carbs and to use alcohol carefully. This information is not intended to replace advice given to you by your health care provider. Make sure you discuss any questions you have with your health care provider. Document Revised: 05/16/2017 Document Reviewed: 07/08/2016 Elsevier Patient Education  2020 [employer].

## 2020-01-03 ENCOUNTER — Ambulatory Visit (INDEPENDENT_AMBULATORY_CARE_PROVIDER_SITE_OTHER): Payer: Medicare HMO | Admitting: *Deleted

## 2020-01-03 DIAGNOSIS — I5022 Chronic systolic (congestive) heart failure: Secondary | ICD-10-CM

## 2020-01-03 DIAGNOSIS — I429 Cardiomyopathy, unspecified: Secondary | ICD-10-CM

## 2020-01-04 LAB — CUP PACEART REMOTE DEVICE CHECK
Battery Remaining Longevity: 12 mo
Battery Remaining Percentage: 15 %
Brady Statistic RA Percent Paced: 0 %
Brady Statistic RV Percent Paced: 0 %
Date Time Interrogation Session: 20210719040400
HighPow Impedance: 60 Ohm
Implantable Lead Implant Date: 20091218
Implantable Lead Implant Date: 20091218
Implantable Lead Location: 753859
Implantable Lead Location: 753860
Implantable Lead Model: 158
Implantable Lead Serial Number: 131093
Implantable Pulse Generator Implant Date: 20091218
Lead Channel Impedance Value: 493 Ohm
Lead Channel Impedance Value: 649 Ohm
Lead Channel Pacing Threshold Amplitude: 0.6 V
Lead Channel Pacing Threshold Amplitude: 0.8 V
Lead Channel Pacing Threshold Pulse Width: 0.5 ms
Lead Channel Pacing Threshold Pulse Width: 0.5 ms
Lead Channel Setting Pacing Amplitude: 2 V
Lead Channel Setting Pacing Amplitude: 2.4 V
Lead Channel Setting Pacing Pulse Width: 0.5 ms
Lead Channel Setting Sensing Sensitivity: 0.6 mV
Pulse Gen Serial Number: 131093

## 2020-01-04 NOTE — Progress Notes (Signed)
Remote ICD transmission.   

## 2020-01-15 DIAGNOSIS — Z992 Dependence on renal dialysis: Secondary | ICD-10-CM | POA: Diagnosis not present

## 2020-01-15 DIAGNOSIS — N186 End stage renal disease: Secondary | ICD-10-CM | POA: Diagnosis not present

## 2020-01-15 DIAGNOSIS — I129 Hypertensive chronic kidney disease with stage 1 through stage 4 chronic kidney disease, or unspecified chronic kidney disease: Secondary | ICD-10-CM | POA: Diagnosis not present

## 2020-01-18 DIAGNOSIS — D689 Coagulation defect, unspecified: Secondary | ICD-10-CM | POA: Diagnosis not present

## 2020-01-18 DIAGNOSIS — D509 Iron deficiency anemia, unspecified: Secondary | ICD-10-CM | POA: Diagnosis not present

## 2020-01-18 DIAGNOSIS — N186 End stage renal disease: Secondary | ICD-10-CM | POA: Diagnosis not present

## 2020-01-18 DIAGNOSIS — Z992 Dependence on renal dialysis: Secondary | ICD-10-CM | POA: Diagnosis not present

## 2020-01-18 DIAGNOSIS — D631 Anemia in chronic kidney disease: Secondary | ICD-10-CM | POA: Diagnosis not present

## 2020-01-18 DIAGNOSIS — E1129 Type 2 diabetes mellitus with other diabetic kidney complication: Secondary | ICD-10-CM | POA: Diagnosis not present

## 2020-01-18 DIAGNOSIS — N2581 Secondary hyperparathyroidism of renal origin: Secondary | ICD-10-CM | POA: Diagnosis not present

## 2020-01-21 ENCOUNTER — Other Ambulatory Visit: Payer: Self-pay | Admitting: Family Medicine

## 2020-02-07 ENCOUNTER — Ambulatory Visit (INDEPENDENT_AMBULATORY_CARE_PROVIDER_SITE_OTHER): Payer: Medicare HMO | Admitting: *Deleted

## 2020-02-07 DIAGNOSIS — N186 End stage renal disease: Secondary | ICD-10-CM | POA: Diagnosis not present

## 2020-02-07 DIAGNOSIS — D689 Coagulation defect, unspecified: Secondary | ICD-10-CM | POA: Diagnosis not present

## 2020-02-07 DIAGNOSIS — I429 Cardiomyopathy, unspecified: Secondary | ICD-10-CM

## 2020-02-07 DIAGNOSIS — Z992 Dependence on renal dialysis: Secondary | ICD-10-CM | POA: Diagnosis not present

## 2020-02-07 DIAGNOSIS — D631 Anemia in chronic kidney disease: Secondary | ICD-10-CM | POA: Diagnosis not present

## 2020-02-07 DIAGNOSIS — Z23 Encounter for immunization: Secondary | ICD-10-CM | POA: Diagnosis not present

## 2020-02-08 LAB — CUP PACEART REMOTE DEVICE CHECK
Battery Remaining Longevity: 12 mo
Battery Remaining Percentage: 14 %
Brady Statistic RA Percent Paced: 0 %
Brady Statistic RV Percent Paced: 0 %
Date Time Interrogation Session: 20210822001400
HighPow Impedance: 51 Ohm
Implantable Lead Implant Date: 20091218
Implantable Lead Implant Date: 20091218
Implantable Lead Location: 753859
Implantable Lead Location: 753860
Implantable Lead Model: 158
Implantable Lead Serial Number: 131093
Implantable Pulse Generator Implant Date: 20091218
Lead Channel Impedance Value: 406 Ohm
Lead Channel Impedance Value: 504 Ohm
Lead Channel Pacing Threshold Amplitude: 0.6 V
Lead Channel Pacing Threshold Amplitude: 0.8 V
Lead Channel Pacing Threshold Pulse Width: 0.5 ms
Lead Channel Pacing Threshold Pulse Width: 0.5 ms
Lead Channel Setting Pacing Amplitude: 2 V
Lead Channel Setting Pacing Amplitude: 2.4 V
Lead Channel Setting Pacing Pulse Width: 0.5 ms
Lead Channel Setting Sensing Sensitivity: 0.6 mV
Pulse Gen Serial Number: 131093

## 2020-02-14 NOTE — Progress Notes (Signed)
Remote ICD transmission.   

## 2020-02-15 DIAGNOSIS — N186 End stage renal disease: Secondary | ICD-10-CM | POA: Diagnosis not present

## 2020-02-15 DIAGNOSIS — I129 Hypertensive chronic kidney disease with stage 1 through stage 4 chronic kidney disease, or unspecified chronic kidney disease: Secondary | ICD-10-CM | POA: Diagnosis not present

## 2020-02-15 DIAGNOSIS — Z992 Dependence on renal dialysis: Secondary | ICD-10-CM | POA: Diagnosis not present

## 2020-02-16 ENCOUNTER — Other Ambulatory Visit: Payer: Self-pay

## 2020-02-16 ENCOUNTER — Encounter: Payer: Self-pay | Admitting: Family Medicine

## 2020-02-16 ENCOUNTER — Ambulatory Visit (INDEPENDENT_AMBULATORY_CARE_PROVIDER_SITE_OTHER): Payer: Medicare HMO | Admitting: Family Medicine

## 2020-02-16 VITALS — BP 112/58 | HR 61 | Temp 97.9°F | Resp 17 | Ht 67.0 in | Wt 157.2 lb

## 2020-02-16 DIAGNOSIS — D689 Coagulation defect, unspecified: Secondary | ICD-10-CM | POA: Diagnosis not present

## 2020-02-16 DIAGNOSIS — Z0000 Encounter for general adult medical examination without abnormal findings: Secondary | ICD-10-CM | POA: Diagnosis not present

## 2020-02-16 DIAGNOSIS — Z125 Encounter for screening for malignant neoplasm of prostate: Secondary | ICD-10-CM | POA: Diagnosis not present

## 2020-02-16 DIAGNOSIS — N186 End stage renal disease: Secondary | ICD-10-CM

## 2020-02-16 DIAGNOSIS — Z23 Encounter for immunization: Secondary | ICD-10-CM

## 2020-02-16 DIAGNOSIS — M24541 Contracture, right hand: Secondary | ICD-10-CM | POA: Diagnosis not present

## 2020-02-16 DIAGNOSIS — Z992 Dependence on renal dialysis: Secondary | ICD-10-CM | POA: Diagnosis not present

## 2020-02-16 DIAGNOSIS — N2581 Secondary hyperparathyroidism of renal origin: Secondary | ICD-10-CM | POA: Diagnosis not present

## 2020-02-16 DIAGNOSIS — D631 Anemia in chronic kidney disease: Secondary | ICD-10-CM | POA: Diagnosis not present

## 2020-02-16 DIAGNOSIS — E1122 Type 2 diabetes mellitus with diabetic chronic kidney disease: Secondary | ICD-10-CM

## 2020-02-16 NOTE — Progress Notes (Signed)
   Subjective:    Patient ID: Tyler Hahn, male    DOB: 11-02-1940, 79 y.o.   MRN: [id number]  HPI CPE- UTD on colonoscopy, foot exam, Pneumonia vaccines, COVID vaccines.  Will get flu shot today.  Has HD M/W/F  Reviewed past medical, surgical, family and social histories.   Patient Care Team    Relationship Specialty Notifications Start End  [name], MD PCP - General   05/30/10   [name], MD Consulting Physician Cardiology  02/14/15   Renato Shin, MD Consulting Physician Endocrinology  02/14/15   Ladene Artist, MD Consulting Physician Gastroenterology  05/18/15   Rosita Fire, MD Consulting Physician Nephrology  08/17/19   [name], [facility] Pharmacist Pharmacist Admissions 09/30/19    Comment: PHONE NUMBER [phone]     Health Maintenance  Topic Date Due  . OPHTHALMOLOGY EXAM  08/25/2017  . INFLUENZA VACCINE  01/16/2020  . HEMOGLOBIN A1C  02/16/2020  . TETANUS/TDAP  09/09/2020 (Originally 06/17/2017)  . FOOT EXAM  08/15/2020  . COVID-19 Vaccine  Completed  . Hepatitis C Screening  Completed  . PNA vac Low Risk Adult  Completed      Review of Systems Patient reports no vision/hearing changes, anorexia, fever ,adenopathy, persistant/recurrent hoarseness, swallowing issues, chest pain, palpitations, edema, persistant/recurrent cough, hemoptysis, dyspnea (rest,exertional, paroxysmal nocturnal), gastrointestinal  bleeding (melena, rectal bleeding), abdominal pain, excessive heart burn, GU symptoms (dysuria, hematuria, voiding/incontinence issues) syncope, focal weakness, memory loss, numbness & tingling, skin/hair/nail changes, depression, anxiety, abnormal bruising/bleeding.   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  Safety protocols were in place, including screening questions prior to the visit, additional usage of staff PPE, and extensive cleaning of exam room while  observing appropriate contact time as indicated for disinfecting solutions.       Objective:   Physical Exam General Appearance:    Alert, cooperative, no distress, appears stated age  Head:    Normocephalic, without obvious abnormality, atraumatic  Eyes:    PERRL, conjunctiva/corneas clear, EOM's intact, fundi    benign, both eyes       Ears:    Normal TM's and external ear canals, both ears  Nose:   Nares normal, septum midline, mucosa normal, no drainage   or sinus tenderness  Throat:   Deferred due to COVID  Neck:   Supple, symmetrical, trachea midline, no adenopathy;       thyroid:  No enlargement/tenderness/nodules  Back:     Symmetric, no curvature, ROM normal, no CVA tenderness  Lungs:     Clear to auscultation bilaterally, respirations unlabored  Chest wall:    No tenderness or deformity  Heart:    Regular rate and rhythm, S1 and S2 normal, no murmur, rub   or gallop  Abdomen:     Soft, non-tender, bowel sounds active all four quadrants,    no masses, no organomegaly  Genitalia:    deferred  Rectal:    Extremities:   Contracture of R 4th and 5th fingers  Pulses:   2+ and symmetric all extremities  Skin:   Skin color, texture, turgor normal, no rashes or lesions  Lymph nodes:   Cervical, supraclavicular, and axillary nodes normal  Neurologic:   CNII-XII intact. Normal strength, sensation and reflexes      throughout          Assessment & Plan:

## 2020-02-16 NOTE — Patient Instructions (Signed)
Follow up in 6 months to recheck sugar, BP, and cholesterol We'll notify you of your lab results and make any changes if needed Keep up the good work on healthy diet and regular walking- you look great! Schedule your eye exam at your convenience We'll call you with your hand specialist referral Call with any questions or concerns Stay Safe!  Stay Healthy!

## 2020-02-16 NOTE — Assessment & Plan Note (Signed)
Pt's PE unchanged from previous and WNL w/ exception of R hand contractures.  UTD on colonoscopy- no need to repeat.  UTD on pneumonia vaccines, COVID vaccines.  Flu shot given today.  Check labs.  Anticipatory guidance provided.

## 2020-02-16 NOTE — Addendum Note (Signed)
Addended by: Fritz Pickerel on: 02/16/2020 11:21 AM   Modules accepted: Orders

## 2020-02-16 NOTE — Assessment & Plan Note (Signed)
Chronic problem.  Currently diet controlled.  UTD on foot exam.  No need for microalbumin due to HD.  Pt to schedule eye exam.  Check labs and determine if medication is needed.

## 2020-02-18 DIAGNOSIS — Z992 Dependence on renal dialysis: Secondary | ICD-10-CM | POA: Diagnosis not present

## 2020-02-18 DIAGNOSIS — N2581 Secondary hyperparathyroidism of renal origin: Secondary | ICD-10-CM | POA: Diagnosis not present

## 2020-02-18 DIAGNOSIS — D689 Coagulation defect, unspecified: Secondary | ICD-10-CM | POA: Diagnosis not present

## 2020-02-18 DIAGNOSIS — N186 End stage renal disease: Secondary | ICD-10-CM | POA: Diagnosis not present

## 2020-02-18 DIAGNOSIS — D631 Anemia in chronic kidney disease: Secondary | ICD-10-CM | POA: Diagnosis not present

## 2020-02-22 DIAGNOSIS — N186 End stage renal disease: Secondary | ICD-10-CM | POA: Diagnosis not present

## 2020-02-22 DIAGNOSIS — N2581 Secondary hyperparathyroidism of renal origin: Secondary | ICD-10-CM | POA: Diagnosis not present

## 2020-02-22 DIAGNOSIS — D689 Coagulation defect, unspecified: Secondary | ICD-10-CM | POA: Diagnosis not present

## 2020-02-22 DIAGNOSIS — Z992 Dependence on renal dialysis: Secondary | ICD-10-CM | POA: Diagnosis not present

## 2020-02-28 DIAGNOSIS — D631 Anemia in chronic kidney disease: Secondary | ICD-10-CM | POA: Diagnosis not present

## 2020-02-28 DIAGNOSIS — D689 Coagulation defect, unspecified: Secondary | ICD-10-CM | POA: Diagnosis not present

## 2020-02-28 DIAGNOSIS — N2581 Secondary hyperparathyroidism of renal origin: Secondary | ICD-10-CM | POA: Diagnosis not present

## 2020-02-28 DIAGNOSIS — N186 End stage renal disease: Secondary | ICD-10-CM | POA: Diagnosis not present

## 2020-02-28 DIAGNOSIS — Z992 Dependence on renal dialysis: Secondary | ICD-10-CM | POA: Diagnosis not present

## 2020-03-01 DIAGNOSIS — Z992 Dependence on renal dialysis: Secondary | ICD-10-CM | POA: Diagnosis not present

## 2020-03-01 DIAGNOSIS — D689 Coagulation defect, unspecified: Secondary | ICD-10-CM | POA: Diagnosis not present

## 2020-03-01 DIAGNOSIS — N2581 Secondary hyperparathyroidism of renal origin: Secondary | ICD-10-CM | POA: Diagnosis not present

## 2020-03-01 DIAGNOSIS — D631 Anemia in chronic kidney disease: Secondary | ICD-10-CM | POA: Diagnosis not present

## 2020-03-01 DIAGNOSIS — N186 End stage renal disease: Secondary | ICD-10-CM | POA: Diagnosis not present

## 2020-03-02 DIAGNOSIS — G5623 Lesion of ulnar nerve, bilateral upper limbs: Secondary | ICD-10-CM | POA: Insufficient documentation

## 2020-03-03 DIAGNOSIS — D689 Coagulation defect, unspecified: Secondary | ICD-10-CM | POA: Diagnosis not present

## 2020-03-03 DIAGNOSIS — D631 Anemia in chronic kidney disease: Secondary | ICD-10-CM | POA: Diagnosis not present

## 2020-03-03 DIAGNOSIS — N186 End stage renal disease: Secondary | ICD-10-CM | POA: Diagnosis not present

## 2020-03-03 DIAGNOSIS — Z992 Dependence on renal dialysis: Secondary | ICD-10-CM | POA: Diagnosis not present

## 2020-03-03 DIAGNOSIS — N2581 Secondary hyperparathyroidism of renal origin: Secondary | ICD-10-CM | POA: Diagnosis not present

## 2020-03-06 DIAGNOSIS — D689 Coagulation defect, unspecified: Secondary | ICD-10-CM | POA: Diagnosis not present

## 2020-03-06 DIAGNOSIS — D631 Anemia in chronic kidney disease: Secondary | ICD-10-CM | POA: Diagnosis not present

## 2020-03-06 DIAGNOSIS — N186 End stage renal disease: Secondary | ICD-10-CM | POA: Diagnosis not present

## 2020-03-06 DIAGNOSIS — Z992 Dependence on renal dialysis: Secondary | ICD-10-CM | POA: Diagnosis not present

## 2020-03-06 DIAGNOSIS — N2581 Secondary hyperparathyroidism of renal origin: Secondary | ICD-10-CM | POA: Diagnosis not present

## 2020-03-07 ENCOUNTER — Other Ambulatory Visit: Payer: Self-pay | Admitting: Family Medicine

## 2020-03-08 DIAGNOSIS — Z992 Dependence on renal dialysis: Secondary | ICD-10-CM | POA: Diagnosis not present

## 2020-03-08 DIAGNOSIS — D689 Coagulation defect, unspecified: Secondary | ICD-10-CM | POA: Diagnosis not present

## 2020-03-08 DIAGNOSIS — N2581 Secondary hyperparathyroidism of renal origin: Secondary | ICD-10-CM | POA: Diagnosis not present

## 2020-03-08 DIAGNOSIS — D631 Anemia in chronic kidney disease: Secondary | ICD-10-CM | POA: Diagnosis not present

## 2020-03-08 DIAGNOSIS — N186 End stage renal disease: Secondary | ICD-10-CM | POA: Diagnosis not present

## 2020-03-10 DIAGNOSIS — D631 Anemia in chronic kidney disease: Secondary | ICD-10-CM | POA: Diagnosis not present

## 2020-03-10 DIAGNOSIS — D689 Coagulation defect, unspecified: Secondary | ICD-10-CM | POA: Diagnosis not present

## 2020-03-10 DIAGNOSIS — N2581 Secondary hyperparathyroidism of renal origin: Secondary | ICD-10-CM | POA: Diagnosis not present

## 2020-03-10 DIAGNOSIS — Z992 Dependence on renal dialysis: Secondary | ICD-10-CM | POA: Diagnosis not present

## 2020-03-10 DIAGNOSIS — N186 End stage renal disease: Secondary | ICD-10-CM | POA: Diagnosis not present

## 2020-03-13 ENCOUNTER — Ambulatory Visit (INDEPENDENT_AMBULATORY_CARE_PROVIDER_SITE_OTHER): Payer: Medicare HMO | Admitting: Emergency Medicine

## 2020-03-13 DIAGNOSIS — I429 Cardiomyopathy, unspecified: Secondary | ICD-10-CM

## 2020-03-14 DIAGNOSIS — N186 End stage renal disease: Secondary | ICD-10-CM | POA: Diagnosis not present

## 2020-03-14 DIAGNOSIS — D631 Anemia in chronic kidney disease: Secondary | ICD-10-CM | POA: Diagnosis not present

## 2020-03-14 DIAGNOSIS — D689 Coagulation defect, unspecified: Secondary | ICD-10-CM | POA: Diagnosis not present

## 2020-03-14 DIAGNOSIS — N2581 Secondary hyperparathyroidism of renal origin: Secondary | ICD-10-CM | POA: Diagnosis not present

## 2020-03-14 DIAGNOSIS — Z992 Dependence on renal dialysis: Secondary | ICD-10-CM | POA: Diagnosis not present

## 2020-03-14 LAB — CUP PACEART REMOTE DEVICE CHECK
Battery Remaining Longevity: 9 mo
Battery Remaining Percentage: 11 %
Brady Statistic RA Percent Paced: 0 %
Brady Statistic RV Percent Paced: 0 %
Date Time Interrogation Session: 20210925085000
HighPow Impedance: 56 Ohm
Implantable Lead Implant Date: 20091218
Implantable Lead Implant Date: 20091218
Implantable Lead Location: 753859
Implantable Lead Location: 753860
Implantable Lead Model: 158
Implantable Lead Serial Number: 131093
Implantable Pulse Generator Implant Date: 20091218
Lead Channel Impedance Value: 439 Ohm
Lead Channel Impedance Value: 594 Ohm
Lead Channel Pacing Threshold Amplitude: 0.6 V
Lead Channel Pacing Threshold Amplitude: 0.8 V
Lead Channel Pacing Threshold Pulse Width: 0.5 ms
Lead Channel Pacing Threshold Pulse Width: 0.5 ms
Lead Channel Setting Pacing Amplitude: 2 V
Lead Channel Setting Pacing Amplitude: 2.4 V
Lead Channel Setting Pacing Pulse Width: 0.5 ms
Lead Channel Setting Sensing Sensitivity: 0.6 mV
Pulse Gen Serial Number: 131093

## 2020-03-15 ENCOUNTER — Telehealth: Payer: Self-pay

## 2020-03-15 NOTE — Progress Notes (Signed)
Chronic Care Management Pharmacy Assistant   Name: Tyler Hahn  MRN: [id number] DOB: 08/28/1940  Reason for Encounter: Disease State   PCP : [name], MD  Allergies:   Allergies  Allergen Reactions  . Sulfonamide Derivatives Itching and Rash    Medications: Outpatient Encounter Medications as of 03/15/2020  Medication Sig  . allopurinol (ZYLOPRIM) 100 MG tablet TAKE 1 TABLET BY MOUTH EVERY DAY  . atorvastatin (LIPITOR) 20 MG tablet Take 1 tablet (20 mg total) by mouth daily.  . finasteride (PROSCAR) 5 MG tablet Take 1 tablet (5 mg total) by mouth daily.  Marland Kitchen levothyroxine (SYNTHROID) 25 MCG tablet TAKE 1 TABLET BY MOUTH EVERY DAY BEFORE BREAKFAST  . multivitamin (RENA-VIT) TABS tablet Take 1 tablet by mouth at bedtime.  . Nutritional Supplements (FEEDING SUPPLEMENT, NEPRO CARB STEADY,) LIQD Take 237 mLs by mouth 2 (two) times daily between meals.  . sertraline (ZOLOFT) 100 MG tablet TAKE 1 TABLET BY MOUTH EVERY DAY   No facility-administered encounter medications on file as of 03/15/2020.    Current Diagnosis: Patient Active Problem List   Diagnosis Date Noted  . Urinary tract infection associated with indwelling urethral catheter ([place])   . Weakness   . ESRD (end stage renal disease) ([place]) 08/30/2019  . Acute renal failure (ARF) ([place]) 08/18/2019  . History of anemia due to CKD 08/18/2019  . Hyperkalemia 08/17/2019  . Hypothyroid 08/16/2019  . Physical exam 02/18/2019  . Greater trochanteric bursitis of left hip 08/04/2018  . Osteoarthritis 09/25/2016  . Screen for colon cancer   . Benign neoplasm of transverse colon   . Edema 07/28/2014  . Adjustment disorder with depressed mood 03/24/2014  . Small bowel mass 12/20/2013  . Myalgia 07/07/2013  . Loss of weight 04/19/2013  . Polyarthralgia 04/19/2013  . Loss of appetite 04/19/2013  . 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 Right elbow pain 08/17/2012  . 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  SYSTOLIC HEART FAILURE, CHRONIC 06/07/2010  . Chronic kidney disease (CKD), stage IV (severe) ([place]) 04/04/2010  . TESTICULAR MASS 04/04/2010  . 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 Hyperlipidemia 03/07/2010  . GOUT 03/07/2010  . Essential hypertension 03/07/2010  . IMPLANTABLE CARDIAC DEFIBRILLATOR -[employer]-SINGLE 03/07/2010    Reviewed chart prior to disease state call. Spoke with patient regarding BP  Recent Office Vitals: BP Readings from Last 3 Encounters:  02/16/20 (!) 112/58  09/29/19 133/68  09/22/19 (!) 122/54   Pulse Readings from Last 3 Encounters:  02/16/20 61  09/29/19 95  09/11/19 76    Wt Readings from Last 3 Encounters:  02/16/20 157 lb 4 oz (71.3 kg)  09/29/19 156 lb 1.6 oz (70.8 kg)  09/22/19 157 lb (71.2 kg)     Kidney Function Lab Results  Component Value Date/Time   CREATININE 2.63 (H) 09/11/2019 02:02 PM   CREATININE 5.12 (H) 09/03/2019 08:11 PM   CREATININE 3.63 (H) 07/04/2017 03:55 PM   GFR 10.07 (LL) 08/16/2019 02:24 PM   GFRNONAA 22 (L) 09/11/2019 02:02 PM   GFRAA 26 (L) 09/11/2019 02:02 PM    BMP Latest Ref Rng & Units 09/11/2019 09/03/2019 09/01/2019  Glucose 70 - 99 mg/dL 77 142(H) 82  BUN 8 - 23 mg/dL 13 57(H) 98(H)  Creatinine 0.61 - 1.24 mg/dL 2.63(H) 5.12(H) 4.76(H)  BUN/Creat Ratio 6 - 22 (calc) - - -  Sodium 135 - 145 mmol/L 141 139 144  Potassium 3.5 - 5.1 mmol/L  2.9(L) 3.6 4.3  Chloride 98 - 111 mmol/L 94(L) 97(L) 106  CO2 22 - 32 mmol/L 32 26 25  Calcium 8.9 - 10.3 mg/dL 8.4(L) 8.6(L) 8.6(L)    . Current antihypertensive regimen:  o Atorvastatin 20mg  Tab Take 1 tab by mouth daily ,   o Allopurinol 100mg  Tab Take 1 tab by mouth every day             . How often are you checking your Blood Pressure? Per patients son , they check his blood pressures when he goes to dialysis and blood pressures have been well. . Current home BP readings: N/A . What recent interventions/DTPs  have been made by any provider to improve Blood Pressure control since last CPP Visit: None noted . Any recent hospitalizations or ED visits since last visit with CPP? No . What diet changes have been made to improve Blood Pressure Control?  o Per patients son, his diet is well.  . What exercise is being done to improve your Blood Pressure Control?  o Per patients son, patient walks everyday  Adherence Review: Is the patient currently on ACE/ARB medication? No Does the patient have >5 day gap between last estimated fill dates? No   [place] with patients son and rescheduled patients appointment from 03-24-20 to 05-05-2020 at 2 pm.  Georgiana Shore ,[place] Pharmacist Assistant [phone]    Follow-Up:  Pharmacist Review

## 2020-03-16 DIAGNOSIS — Z992 Dependence on renal dialysis: Secondary | ICD-10-CM | POA: Diagnosis not present

## 2020-03-16 DIAGNOSIS — D689 Coagulation defect, unspecified: Secondary | ICD-10-CM | POA: Diagnosis not present

## 2020-03-16 DIAGNOSIS — D631 Anemia in chronic kidney disease: Secondary | ICD-10-CM | POA: Diagnosis not present

## 2020-03-16 DIAGNOSIS — N186 End stage renal disease: Secondary | ICD-10-CM | POA: Diagnosis not present

## 2020-03-16 DIAGNOSIS — N2581 Secondary hyperparathyroidism of renal origin: Secondary | ICD-10-CM | POA: Diagnosis not present

## 2020-03-16 DIAGNOSIS — I129 Hypertensive chronic kidney disease with stage 1 through stage 4 chronic kidney disease, or unspecified chronic kidney disease: Secondary | ICD-10-CM | POA: Diagnosis not present

## 2020-03-16 NOTE — Addendum Note (Signed)
Addended by: Cheri Kearns A on: 03/16/2020 09:49 AM   Modules accepted: Level of Service

## 2020-03-16 NOTE — Progress Notes (Signed)
Remote ICD transmission.   

## 2020-03-18 DIAGNOSIS — Z992 Dependence on renal dialysis: Secondary | ICD-10-CM | POA: Diagnosis not present

## 2020-03-18 DIAGNOSIS — D631 Anemia in chronic kidney disease: Secondary | ICD-10-CM | POA: Diagnosis not present

## 2020-03-18 DIAGNOSIS — N186 End stage renal disease: Secondary | ICD-10-CM | POA: Diagnosis not present

## 2020-03-18 DIAGNOSIS — D509 Iron deficiency anemia, unspecified: Secondary | ICD-10-CM | POA: Diagnosis not present

## 2020-03-18 DIAGNOSIS — D689 Coagulation defect, unspecified: Secondary | ICD-10-CM | POA: Diagnosis not present

## 2020-03-18 DIAGNOSIS — N2581 Secondary hyperparathyroidism of renal origin: Secondary | ICD-10-CM | POA: Diagnosis not present

## 2020-03-21 ENCOUNTER — Telehealth: Payer: Medicare HMO

## 2020-03-21 DIAGNOSIS — N2581 Secondary hyperparathyroidism of renal origin: Secondary | ICD-10-CM | POA: Diagnosis not present

## 2020-03-21 DIAGNOSIS — Z992 Dependence on renal dialysis: Secondary | ICD-10-CM | POA: Diagnosis not present

## 2020-03-21 DIAGNOSIS — D689 Coagulation defect, unspecified: Secondary | ICD-10-CM | POA: Diagnosis not present

## 2020-03-21 DIAGNOSIS — D631 Anemia in chronic kidney disease: Secondary | ICD-10-CM | POA: Diagnosis not present

## 2020-03-21 DIAGNOSIS — N186 End stage renal disease: Secondary | ICD-10-CM | POA: Diagnosis not present

## 2020-03-21 DIAGNOSIS — D509 Iron deficiency anemia, unspecified: Secondary | ICD-10-CM | POA: Diagnosis not present

## 2020-03-23 DIAGNOSIS — D509 Iron deficiency anemia, unspecified: Secondary | ICD-10-CM | POA: Diagnosis not present

## 2020-03-23 DIAGNOSIS — D689 Coagulation defect, unspecified: Secondary | ICD-10-CM | POA: Diagnosis not present

## 2020-03-23 DIAGNOSIS — N186 End stage renal disease: Secondary | ICD-10-CM | POA: Diagnosis not present

## 2020-03-23 DIAGNOSIS — D631 Anemia in chronic kidney disease: Secondary | ICD-10-CM | POA: Diagnosis not present

## 2020-03-23 DIAGNOSIS — Z992 Dependence on renal dialysis: Secondary | ICD-10-CM | POA: Diagnosis not present

## 2020-03-23 DIAGNOSIS — N2581 Secondary hyperparathyroidism of renal origin: Secondary | ICD-10-CM | POA: Diagnosis not present

## 2020-03-24 ENCOUNTER — Telehealth: Payer: Medicare HMO

## 2020-03-25 DIAGNOSIS — Z992 Dependence on renal dialysis: Secondary | ICD-10-CM | POA: Diagnosis not present

## 2020-03-25 DIAGNOSIS — N2581 Secondary hyperparathyroidism of renal origin: Secondary | ICD-10-CM | POA: Diagnosis not present

## 2020-03-25 DIAGNOSIS — D689 Coagulation defect, unspecified: Secondary | ICD-10-CM | POA: Diagnosis not present

## 2020-03-25 DIAGNOSIS — D509 Iron deficiency anemia, unspecified: Secondary | ICD-10-CM | POA: Diagnosis not present

## 2020-03-25 DIAGNOSIS — D631 Anemia in chronic kidney disease: Secondary | ICD-10-CM | POA: Diagnosis not present

## 2020-03-25 DIAGNOSIS — N186 End stage renal disease: Secondary | ICD-10-CM | POA: Diagnosis not present

## 2020-03-27 ENCOUNTER — Telehealth: Payer: Self-pay

## 2020-03-27 NOTE — Progress Notes (Signed)
Error

## 2020-03-28 DIAGNOSIS — Z992 Dependence on renal dialysis: Secondary | ICD-10-CM | POA: Diagnosis not present

## 2020-03-28 DIAGNOSIS — D631 Anemia in chronic kidney disease: Secondary | ICD-10-CM | POA: Diagnosis not present

## 2020-03-28 DIAGNOSIS — D689 Coagulation defect, unspecified: Secondary | ICD-10-CM | POA: Diagnosis not present

## 2020-03-28 DIAGNOSIS — N186 End stage renal disease: Secondary | ICD-10-CM | POA: Diagnosis not present

## 2020-03-28 DIAGNOSIS — D509 Iron deficiency anemia, unspecified: Secondary | ICD-10-CM | POA: Diagnosis not present

## 2020-03-28 DIAGNOSIS — N2581 Secondary hyperparathyroidism of renal origin: Secondary | ICD-10-CM | POA: Diagnosis not present

## 2020-03-30 DIAGNOSIS — N2581 Secondary hyperparathyroidism of renal origin: Secondary | ICD-10-CM | POA: Diagnosis not present

## 2020-03-30 DIAGNOSIS — D631 Anemia in chronic kidney disease: Secondary | ICD-10-CM | POA: Diagnosis not present

## 2020-03-30 DIAGNOSIS — D689 Coagulation defect, unspecified: Secondary | ICD-10-CM | POA: Diagnosis not present

## 2020-03-30 DIAGNOSIS — Z992 Dependence on renal dialysis: Secondary | ICD-10-CM | POA: Diagnosis not present

## 2020-03-30 DIAGNOSIS — N186 End stage renal disease: Secondary | ICD-10-CM | POA: Diagnosis not present

## 2020-03-30 DIAGNOSIS — D509 Iron deficiency anemia, unspecified: Secondary | ICD-10-CM | POA: Diagnosis not present

## 2020-04-01 DIAGNOSIS — N2581 Secondary hyperparathyroidism of renal origin: Secondary | ICD-10-CM | POA: Diagnosis not present

## 2020-04-01 DIAGNOSIS — D689 Coagulation defect, unspecified: Secondary | ICD-10-CM | POA: Diagnosis not present

## 2020-04-01 DIAGNOSIS — Z992 Dependence on renal dialysis: Secondary | ICD-10-CM | POA: Diagnosis not present

## 2020-04-01 DIAGNOSIS — D509 Iron deficiency anemia, unspecified: Secondary | ICD-10-CM | POA: Diagnosis not present

## 2020-04-01 DIAGNOSIS — D631 Anemia in chronic kidney disease: Secondary | ICD-10-CM | POA: Diagnosis not present

## 2020-04-01 DIAGNOSIS — N186 End stage renal disease: Secondary | ICD-10-CM | POA: Diagnosis not present

## 2020-04-04 DIAGNOSIS — D689 Coagulation defect, unspecified: Secondary | ICD-10-CM | POA: Diagnosis not present

## 2020-04-04 DIAGNOSIS — D509 Iron deficiency anemia, unspecified: Secondary | ICD-10-CM | POA: Diagnosis not present

## 2020-04-04 DIAGNOSIS — Z992 Dependence on renal dialysis: Secondary | ICD-10-CM | POA: Diagnosis not present

## 2020-04-04 DIAGNOSIS — N2581 Secondary hyperparathyroidism of renal origin: Secondary | ICD-10-CM | POA: Diagnosis not present

## 2020-04-04 DIAGNOSIS — N186 End stage renal disease: Secondary | ICD-10-CM | POA: Diagnosis not present

## 2020-04-04 DIAGNOSIS — D631 Anemia in chronic kidney disease: Secondary | ICD-10-CM | POA: Diagnosis not present

## 2020-04-05 ENCOUNTER — Other Ambulatory Visit: Payer: Self-pay | Admitting: Family Medicine

## 2020-04-06 DIAGNOSIS — N186 End stage renal disease: Secondary | ICD-10-CM | POA: Diagnosis not present

## 2020-04-06 DIAGNOSIS — D689 Coagulation defect, unspecified: Secondary | ICD-10-CM | POA: Diagnosis not present

## 2020-04-06 DIAGNOSIS — D509 Iron deficiency anemia, unspecified: Secondary | ICD-10-CM | POA: Diagnosis not present

## 2020-04-06 DIAGNOSIS — D631 Anemia in chronic kidney disease: Secondary | ICD-10-CM | POA: Diagnosis not present

## 2020-04-06 DIAGNOSIS — N2581 Secondary hyperparathyroidism of renal origin: Secondary | ICD-10-CM | POA: Diagnosis not present

## 2020-04-06 DIAGNOSIS — Z992 Dependence on renal dialysis: Secondary | ICD-10-CM | POA: Diagnosis not present

## 2020-04-08 DIAGNOSIS — Z992 Dependence on renal dialysis: Secondary | ICD-10-CM | POA: Diagnosis not present

## 2020-04-08 DIAGNOSIS — D689 Coagulation defect, unspecified: Secondary | ICD-10-CM | POA: Diagnosis not present

## 2020-04-08 DIAGNOSIS — N186 End stage renal disease: Secondary | ICD-10-CM | POA: Diagnosis not present

## 2020-04-08 DIAGNOSIS — N2581 Secondary hyperparathyroidism of renal origin: Secondary | ICD-10-CM | POA: Diagnosis not present

## 2020-04-08 DIAGNOSIS — D631 Anemia in chronic kidney disease: Secondary | ICD-10-CM | POA: Diagnosis not present

## 2020-04-08 DIAGNOSIS — D509 Iron deficiency anemia, unspecified: Secondary | ICD-10-CM | POA: Diagnosis not present

## 2020-04-11 DIAGNOSIS — D509 Iron deficiency anemia, unspecified: Secondary | ICD-10-CM | POA: Diagnosis not present

## 2020-04-11 DIAGNOSIS — N186 End stage renal disease: Secondary | ICD-10-CM | POA: Diagnosis not present

## 2020-04-11 DIAGNOSIS — N2581 Secondary hyperparathyroidism of renal origin: Secondary | ICD-10-CM | POA: Diagnosis not present

## 2020-04-11 DIAGNOSIS — D689 Coagulation defect, unspecified: Secondary | ICD-10-CM | POA: Diagnosis not present

## 2020-04-11 DIAGNOSIS — Z992 Dependence on renal dialysis: Secondary | ICD-10-CM | POA: Diagnosis not present

## 2020-04-11 DIAGNOSIS — D631 Anemia in chronic kidney disease: Secondary | ICD-10-CM | POA: Diagnosis not present

## 2020-04-13 DIAGNOSIS — D689 Coagulation defect, unspecified: Secondary | ICD-10-CM | POA: Diagnosis not present

## 2020-04-13 DIAGNOSIS — D631 Anemia in chronic kidney disease: Secondary | ICD-10-CM | POA: Diagnosis not present

## 2020-04-13 DIAGNOSIS — D509 Iron deficiency anemia, unspecified: Secondary | ICD-10-CM | POA: Diagnosis not present

## 2020-04-13 DIAGNOSIS — Z992 Dependence on renal dialysis: Secondary | ICD-10-CM | POA: Diagnosis not present

## 2020-04-13 DIAGNOSIS — N2581 Secondary hyperparathyroidism of renal origin: Secondary | ICD-10-CM | POA: Diagnosis not present

## 2020-04-13 DIAGNOSIS — N186 End stage renal disease: Secondary | ICD-10-CM | POA: Diagnosis not present

## 2020-04-15 DIAGNOSIS — D631 Anemia in chronic kidney disease: Secondary | ICD-10-CM | POA: Diagnosis not present

## 2020-04-15 DIAGNOSIS — Z992 Dependence on renal dialysis: Secondary | ICD-10-CM | POA: Diagnosis not present

## 2020-04-15 DIAGNOSIS — N186 End stage renal disease: Secondary | ICD-10-CM | POA: Diagnosis not present

## 2020-04-15 DIAGNOSIS — N2581 Secondary hyperparathyroidism of renal origin: Secondary | ICD-10-CM | POA: Diagnosis not present

## 2020-04-15 DIAGNOSIS — D689 Coagulation defect, unspecified: Secondary | ICD-10-CM | POA: Diagnosis not present

## 2020-04-15 DIAGNOSIS — D509 Iron deficiency anemia, unspecified: Secondary | ICD-10-CM | POA: Diagnosis not present

## 2020-04-16 DIAGNOSIS — I129 Hypertensive chronic kidney disease with stage 1 through stage 4 chronic kidney disease, or unspecified chronic kidney disease: Secondary | ICD-10-CM | POA: Diagnosis not present

## 2020-04-16 DIAGNOSIS — Z992 Dependence on renal dialysis: Secondary | ICD-10-CM | POA: Diagnosis not present

## 2020-04-16 DIAGNOSIS — N186 End stage renal disease: Secondary | ICD-10-CM | POA: Diagnosis not present

## 2020-04-17 ENCOUNTER — Ambulatory Visit (INDEPENDENT_AMBULATORY_CARE_PROVIDER_SITE_OTHER): Payer: Medicare HMO

## 2020-04-17 DIAGNOSIS — I429 Cardiomyopathy, unspecified: Secondary | ICD-10-CM | POA: Diagnosis not present

## 2020-04-18 DIAGNOSIS — Z992 Dependence on renal dialysis: Secondary | ICD-10-CM | POA: Diagnosis not present

## 2020-04-18 DIAGNOSIS — D689 Coagulation defect, unspecified: Secondary | ICD-10-CM | POA: Diagnosis not present

## 2020-04-18 DIAGNOSIS — D631 Anemia in chronic kidney disease: Secondary | ICD-10-CM | POA: Diagnosis not present

## 2020-04-18 DIAGNOSIS — N2581 Secondary hyperparathyroidism of renal origin: Secondary | ICD-10-CM | POA: Diagnosis not present

## 2020-04-18 DIAGNOSIS — D509 Iron deficiency anemia, unspecified: Secondary | ICD-10-CM | POA: Diagnosis not present

## 2020-04-18 DIAGNOSIS — N186 End stage renal disease: Secondary | ICD-10-CM | POA: Diagnosis not present

## 2020-04-18 LAB — CUP PACEART REMOTE DEVICE CHECK
Battery Remaining Longevity: 9 mo
Battery Remaining Percentage: 10 %
Brady Statistic RA Percent Paced: 0 %
Brady Statistic RV Percent Paced: 0 %
Date Time Interrogation Session: 20211101032200
HighPow Impedance: 59 Ohm
Implantable Lead Implant Date: 20091218
Implantable Lead Implant Date: 20091218
Implantable Lead Location: 753859
Implantable Lead Location: 753860
Implantable Lead Model: 158
Implantable Lead Serial Number: 131093
Implantable Pulse Generator Implant Date: 20091218
Lead Channel Impedance Value: 468 Ohm
Lead Channel Impedance Value: 604 Ohm
Lead Channel Pacing Threshold Amplitude: 0.6 V
Lead Channel Pacing Threshold Amplitude: 0.8 V
Lead Channel Pacing Threshold Pulse Width: 0.5 ms
Lead Channel Pacing Threshold Pulse Width: 0.5 ms
Lead Channel Setting Pacing Amplitude: 2 V
Lead Channel Setting Pacing Amplitude: 2.4 V
Lead Channel Setting Pacing Pulse Width: 0.5 ms
Lead Channel Setting Sensing Sensitivity: 0.6 mV
Pulse Gen Serial Number: 131093

## 2020-04-20 DIAGNOSIS — D631 Anemia in chronic kidney disease: Secondary | ICD-10-CM | POA: Diagnosis not present

## 2020-04-20 DIAGNOSIS — N186 End stage renal disease: Secondary | ICD-10-CM | POA: Diagnosis not present

## 2020-04-20 DIAGNOSIS — N2581 Secondary hyperparathyroidism of renal origin: Secondary | ICD-10-CM | POA: Diagnosis not present

## 2020-04-20 DIAGNOSIS — D689 Coagulation defect, unspecified: Secondary | ICD-10-CM | POA: Diagnosis not present

## 2020-04-20 DIAGNOSIS — D509 Iron deficiency anemia, unspecified: Secondary | ICD-10-CM | POA: Diagnosis not present

## 2020-04-20 DIAGNOSIS — Z992 Dependence on renal dialysis: Secondary | ICD-10-CM | POA: Diagnosis not present

## 2020-04-20 NOTE — Progress Notes (Signed)
Remote ICD transmission.   

## 2020-04-22 DIAGNOSIS — N2581 Secondary hyperparathyroidism of renal origin: Secondary | ICD-10-CM | POA: Diagnosis not present

## 2020-04-22 DIAGNOSIS — N186 End stage renal disease: Secondary | ICD-10-CM | POA: Diagnosis not present

## 2020-04-22 DIAGNOSIS — D509 Iron deficiency anemia, unspecified: Secondary | ICD-10-CM | POA: Diagnosis not present

## 2020-04-22 DIAGNOSIS — D689 Coagulation defect, unspecified: Secondary | ICD-10-CM | POA: Diagnosis not present

## 2020-04-22 DIAGNOSIS — D631 Anemia in chronic kidney disease: Secondary | ICD-10-CM | POA: Diagnosis not present

## 2020-04-22 DIAGNOSIS — Z992 Dependence on renal dialysis: Secondary | ICD-10-CM | POA: Diagnosis not present

## 2020-04-25 ENCOUNTER — Encounter (HOSPITAL_COMMUNITY): Payer: Self-pay | Admitting: Emergency Medicine

## 2020-04-25 ENCOUNTER — Other Ambulatory Visit: Payer: Self-pay

## 2020-04-25 ENCOUNTER — Emergency Department (HOSPITAL_COMMUNITY)
Admission: EM | Admit: 2020-04-25 | Discharge: 2020-04-25 | Disposition: A | Discharge status: Home / Self Care | Payer: Medicare HMO | Attending: Emergency Medicine | Admitting: Emergency Medicine

## 2020-04-25 DIAGNOSIS — N186 End stage renal disease: Secondary | ICD-10-CM | POA: Insufficient documentation

## 2020-04-25 DIAGNOSIS — Y33XXXA Other specified events, undetermined intent, initial encounter: Secondary | ICD-10-CM | POA: Insufficient documentation

## 2020-04-25 DIAGNOSIS — Y939 Activity, unspecified: Secondary | ICD-10-CM | POA: Insufficient documentation

## 2020-04-25 DIAGNOSIS — I509 Heart failure, unspecified: Secondary | ICD-10-CM | POA: Diagnosis not present

## 2020-04-25 DIAGNOSIS — D509 Iron deficiency anemia, unspecified: Secondary | ICD-10-CM | POA: Diagnosis not present

## 2020-04-25 DIAGNOSIS — Z79899 Other long term (current) drug therapy: Secondary | ICD-10-CM | POA: Diagnosis not present

## 2020-04-25 DIAGNOSIS — R58 Hemorrhage, not elsewhere classified: Secondary | ICD-10-CM | POA: Diagnosis not present

## 2020-04-25 DIAGNOSIS — Z87891 Personal history of nicotine dependence: Secondary | ICD-10-CM | POA: Diagnosis not present

## 2020-04-25 DIAGNOSIS — I132 Hypertensive heart and chronic kidney disease with heart failure and with stage 5 chronic kidney disease, or end stage renal disease: Secondary | ICD-10-CM | POA: Diagnosis not present

## 2020-04-25 DIAGNOSIS — D631 Anemia in chronic kidney disease: Secondary | ICD-10-CM | POA: Diagnosis not present

## 2020-04-25 DIAGNOSIS — Y999 Unspecified external cause status: Secondary | ICD-10-CM | POA: Diagnosis not present

## 2020-04-25 DIAGNOSIS — N2581 Secondary hyperparathyroidism of renal origin: Secondary | ICD-10-CM | POA: Diagnosis not present

## 2020-04-25 DIAGNOSIS — S91011A Laceration without foreign body, right ankle, initial encounter: Secondary | ICD-10-CM | POA: Diagnosis not present

## 2020-04-25 DIAGNOSIS — E039 Hypothyroidism, unspecified: Secondary | ICD-10-CM | POA: Diagnosis not present

## 2020-04-25 DIAGNOSIS — T148XXA Other injury of unspecified body region, initial encounter: Secondary | ICD-10-CM

## 2020-04-25 DIAGNOSIS — E119 Type 2 diabetes mellitus without complications: Secondary | ICD-10-CM | POA: Insufficient documentation

## 2020-04-25 DIAGNOSIS — D689 Coagulation defect, unspecified: Secondary | ICD-10-CM | POA: Diagnosis not present

## 2020-04-25 DIAGNOSIS — Z992 Dependence on renal dialysis: Secondary | ICD-10-CM | POA: Diagnosis not present

## 2020-04-25 DIAGNOSIS — I10 Essential (primary) hypertension: Secondary | ICD-10-CM | POA: Diagnosis not present

## 2020-04-25 DIAGNOSIS — Y929 Unspecified place or not applicable: Secondary | ICD-10-CM | POA: Diagnosis not present

## 2020-04-25 NOTE — Discharge Instructions (Addendum)
You were seen today with concern for bleeding from your right ankle.  You likely had a scab that came off resulting in some venous bleeding.  Keep dressing in place.  If you rebleed, apply direct pressure.

## 2020-04-25 NOTE — ED Provider Notes (Signed)
[facility] DEPT Provider Note   CSN: [id number] Arrival date & time: 04/25/20  0102     History Chief Complaint  Patient presents with  . Extremity Laceration    Tyler Hahn is a 79 y.o. male.  HPI     This is a 79 year old male with a history of CHF, diabetes, hypertension, hyperlipidemia who presents with bleeding from the right ankle.  Patient reports that he was getting up to go to the bathroom before bed when he noted bleeding from the right ankle.  He denies injury or prior wounds.  He is not on any blood thinners.  He states that he applied pressure but noted a significant amount of blood in the bathroom.  No pain in the ankle.  He has been ambulatory.  Per EMS there was a significant amount of blood on the floor.  Denies any complaints at this time including chest pain, shortness breath, abdominal pain, nausea, vomiting.  No active bleeding. Past Medical History:  Diagnosis Date  . Anxiety   . Automatic implantable cardioverter-defibrillator in situ    greg taylor  . CHF (congestive heart failure) ([place])    2000  . Diabetes mellitus    no meds  . Fatty liver   . Gout    "bout 2-3 months ago"-meds helped.  . Hyperlipidemia   . Hypertension   . Hyperthyroidism   . Kidney cysts   . PONV (postoperative nausea and vomiting)   . Renal insufficiency     Patient Active Problem List   Diagnosis Date Noted  . Urinary tract infection associated with indwelling urethral catheter ([place])   . Weakness   . ESRD (end stage renal disease) ([place]) 08/30/2019  . Acute renal failure (ARF) ([place]) 08/18/2019  . History of anemia due to CKD 08/18/2019  . Hyperkalemia 08/17/2019  . Hypothyroid 08/16/2019  . Physical exam 02/18/2019  . Greater trochanteric bursitis of left hip 08/04/2018  . Osteoarthritis 09/25/2016  . Screen for colon cancer   . Benign neoplasm of transverse colon   . Edema 07/28/2014  . Adjustment disorder with depressed mood  03/24/2014  . Small bowel mass 12/20/2013  . Myalgia 07/07/2013  . Loss of weight 04/19/2013  . Polyarthralgia 04/19/2013  . Loss of appetite 04/19/2013  . 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 Right elbow pain 08/17/2012  . 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 SYSTOLIC HEART FAILURE, CHRONIC 06/07/2010  . Chronic kidney disease (CKD), stage IV (severe) ([place]) 04/04/2010  . TESTICULAR MASS 04/04/2010  . 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[place]) 03/07/2010  . Hyperlipidemia 03/07/2010  . GOUT 03/07/2010  . Essential hypertension 03/07/2010  . IMPLANTABLE CARDIAC DEFIBRILLATOR -[employer]-SINGLE 03/07/2010    Past Surgical History:  Procedure Laterality Date  . AV FISTULA PLACEMENT Right 08/23/2019   Procedure: ARTERIOVENOUS GORTEX GRAFT RIGHT ARM;  Surgeon: Rosetta Posner, MD;  Location: [facility];  Service: Vascular;  Laterality: Right;  . CARDIAC DEFIBRILLATOR PLACEMENT    . COLONOSCOPY WITH PROPOFOL N/A 10/03/2015   Procedure: COLONOSCOPY WITH PROPOFOL;  Surgeon: Ladene Artist, MD;  [facility];  Service: Endoscopy;  Laterality: N/A;  . DIAGNOSTIC LAPAROSCOPY  01/27/2014   Dr Brantley Stage  . ENTEROSCOPY N/A 11/25/2013   Procedure: ENTEROSCOPY;  Surgeon: Ladene Artist, MD;  [facility];  Service: Endoscopy;  Laterality: N/A;  . ICD,[place] Scentific    . LAPAROSCOPY N/A 01/27/2014   Procedure: LAPAROSCOPY DIAGNOSTIC;  Surgeon: Joyice Faster. Cornett,  MD;  Location: [facility];  Service: General;  Laterality: N/A;  . polyp removal throat     difficulty speaking       Family History  Problem Relation Age of Onset  . Stomach cancer Mother   . Alzheimer's disease Mother   . Diabetes Father   . Heart disease Father   . Liver disease Father   . Kidney disease Father     Social History   Tobacco Use  . Smoking status: Former Smoker    Types: Cigarettes    Quit date: 11/26/1998    Years since quitting: 21.4  . Smokeless tobacco:  Never Used  Vaping Use  . Vaping Use: Never used  Substance Use Topics  . Alcohol use: No    Alcohol/week: 0.0 standard drinks  . Drug use: No    Home Medications Prior to Admission medications   Medication Sig Start Date End Date Taking? Authorizing Provider  allopurinol (ZYLOPRIM) 100 MG tablet TAKE 1 TABLET BY MOUTH EVERY DAY 03/07/20   [name], MD  atorvastatin (LIPITOR) 20 MG tablet TAKE 1 TABLET BY MOUTH EVERY DAY 04/06/20   [name], MD  finasteride (PROSCAR) 5 MG tablet Take 1 tablet (5 mg total) by mouth daily. 09/04/19   Loletha Grayer, MD  levothyroxine (SYNTHROID) 25 MCG tablet TAKE 1 TABLET BY MOUTH EVERY DAY BEFORE BREAKFAST 11/08/19   [name], MD  multivitamin (RENA-VIT) TABS tablet Take 1 tablet by mouth at bedtime. 09/04/19   Loletha Grayer, MD  Nutritional Supplements (FEEDING SUPPLEMENT, NEPRO CARB STEADY,) LIQD Take 237 mLs by mouth 2 (two) times daily between meals. 09/04/19   Loletha Grayer, MD  sertraline (ZOLOFT) 100 MG tablet TAKE 1 TABLET BY MOUTH EVERY DAY 04/06/20   [name], MD    Allergies    Sulfa antibiotics and Sulfonamide derivatives  Review of Systems   Review of Systems  Constitutional: Negative for fever.  Respiratory: Negative for shortness of breath.   Cardiovascular: Negative for chest pain.  Gastrointestinal: Negative for abdominal pain, nausea and vomiting.  Skin:       Dried blood noted right ankle  All other systems reviewed and are negative.   Physical Exam Updated Vital Signs BP (!) 141/63 (BP Location: Left Arm)   Pulse 83   Temp 97.8 F (36.6 C) (Oral)   Resp 12   Ht 1.721 m (5' 7.75")   Wt 74.8 kg   SpO2 95%   BMI 25.27 kg/m   Physical Exam Vitals and nursing note reviewed.  Constitutional:      Appearance: He is well-developed. He is not toxic-appearing or diaphoretic.     Comments: Chronically ill-appearing but nontoxic  HENT:     Head: Normocephalic and atraumatic.       Mouth/Throat:     Mouth: Mucous membranes are moist.  Eyes:     Pupils: Pupils are equal, round, and reactive to light.  Cardiovascular:     Rate and Rhythm: Normal rate and regular rhythm.  Pulmonary:     Effort: Pulmonary effort is normal. No respiratory distress.  Abdominal:     Palpations: Abdomen is soft.  Musculoskeletal:     Cervical back: Neck supple.     Comments: Trace to 1+ bilateral lower extremity edema noted  Lymphadenopathy:     Cervical: No cervical adenopathy.  Skin:    General: Skin is warm and dry.     Comments: Dried blood noted right lateral ankle, this was  cleaned and he had a small punctate wound at the lateral malleolus, no bleeding noted  Neurological:     Mental Status: He is alert and oriented to person, place, and time.  Psychiatric:        Mood and Affect: Mood normal.     ED Results / Procedures / Treatments   Labs (all labs ordered are listed, but only abnormal results are displayed) Labs Reviewed - No data to display  EKG None  Radiology No results found.  Procedures Procedures (including critical care time)  Medications Ordered in ED Medications - No data to display  ED Course  I have reviewed the triage vital signs and the nursing notes.  Pertinent labs & imaging results that were available during my care of the patient were reviewed by me and considered in my medical decision making (see chart for details).    MDM Rules/Calculators/A&P                          Presents with concerns for bleeding from the right ankle.  Denies any injury.  He is overall nontoxic and vital signs are reassuring.  No active bleeding noted on exam.  It appears that he likely had a small scab that fell off as he has a very small punctate wound over the lateral malleolus.  There is no active bleeding on my exam.  Dressing was placed and no rebleeding noted.  He is not on any anticoagulants.  He has no dizziness or other concerns at this time.  Do not  feel he needs any formal work-up or imaging.  After history, exam, and medical workup I feel the patient has been appropriately medically screened and is safe for discharge home. Pertinent diagnoses were discussed with the patient. Patient was given return precautions.  Final Clinical Impression(s) / ED Diagnoses Final diagnoses:  Bleeding from wound    Rx / DC Orders ED Discharge Orders    None       Merryl Hacker, MD 04/25/20 [phone]

## 2020-04-25 NOTE — ED Triage Notes (Addendum)
Patient comes from independent living [employer]) by [place]. Patient has laceration on right ankle. Patient states he lost a lot of blood in his bathroom from this laceration.

## 2020-04-26 ENCOUNTER — Encounter: Payer: Self-pay | Admitting: Family Medicine

## 2020-04-26 ENCOUNTER — Ambulatory Visit (INDEPENDENT_AMBULATORY_CARE_PROVIDER_SITE_OTHER): Payer: Medicare HMO | Admitting: Family Medicine

## 2020-04-26 VITALS — BP 118/70 | Temp 98.1°F | Resp 16 | Ht 67.0 in | Wt 158.2 lb

## 2020-04-26 DIAGNOSIS — R0989 Other specified symptoms and signs involving the circulatory and respiratory systems: Secondary | ICD-10-CM

## 2020-04-26 DIAGNOSIS — R58 Hemorrhage, not elsewhere classified: Secondary | ICD-10-CM

## 2020-04-26 NOTE — Patient Instructions (Signed)
Please go to [facility] (across from [facility]) to get your labs done (ordered in September at time of physical) We'll call you with your vascular appt If you have another bleed like that, call 911 Keep band aid on to allow area to heal Call and schedule your eye exam Call with any questions or concerns Stay Safe!  Stay Healthy!! Happy Holidays!!

## 2020-04-26 NOTE — Progress Notes (Signed)
   Subjective:    Patient ID: Tyler Hahn, male    DOB: January 26, 1941, 79 y.o.   MRN: [id number]  HPI ER f/u of R ankle wound- 2 days ago, pt got up to go the bathroom before bed when he noted bleeding from the R ankle.  Per EMS there was significant blood on the floor.  By the time he went to ER, there was no active bleeding.  Pt is not on blood thinners.  Pt doesn't recall injury.  Pt reports blood was 'shooting out'.  Review of Systems For ROS see HPI   This visit occurred during the SARS-CoV-2 public health emergency.  Safety protocols were in place, including screening questions prior to the visit, additional usage of staff PPE, and extensive cleaning of exam room while observing appropriate contact time as indicated for disinfecting solutions.       Objective:   Physical Exam Vitals reviewed.  Constitutional:      General: He is not in acute distress.    Appearance: Normal appearance. He is not toxic-appearing.  HENT:     Head: Normocephalic and atraumatic.  Cardiovascular:     Pulses: Decreased pulses.          Dorsalis pedis pulses are 0 on the right side.       Posterior tibial pulses are 0 on the right side.  Musculoskeletal:        General: No tenderness.     Right lower leg: No edema.     Left lower leg: No edema.  Skin:    General: Skin is warm and dry.     Comments: Pin point sized break in skin over R lateral malleolus- no bleeding currently  Neurological:     General: No focal deficit present.     Mental Status: He is alert and oriented to person, place, and time.  Psychiatric:        Mood and Affect: Mood normal.        Behavior: Behavior normal.        Thought Content: Thought content normal.           Assessment & Plan:  Venous bleed- pt has pinpoint break in skin over R lateral malleolus.  Based on the noted amount of blood in the ER note and the pt's report that it was 'shooting out', it sounds like a venous (less likely arterial) bleed.  No known  injury or cause.  Given hx of DM, renal insufficiency, HTN will refer to vascular for evaluation.  Pt expressed understanding and is in agreement w/ plan.   Decreased pedal pulses- pt were not able to be palpated today in office.  No doppler available to use.  Will refer to vascular.  Pt expressed understanding and is in agreement w/ plan.

## 2020-04-27 DIAGNOSIS — N186 End stage renal disease: Secondary | ICD-10-CM | POA: Diagnosis not present

## 2020-04-27 DIAGNOSIS — D631 Anemia in chronic kidney disease: Secondary | ICD-10-CM | POA: Diagnosis not present

## 2020-04-27 DIAGNOSIS — D509 Iron deficiency anemia, unspecified: Secondary | ICD-10-CM | POA: Diagnosis not present

## 2020-04-27 DIAGNOSIS — D689 Coagulation defect, unspecified: Secondary | ICD-10-CM | POA: Diagnosis not present

## 2020-04-27 DIAGNOSIS — Z992 Dependence on renal dialysis: Secondary | ICD-10-CM | POA: Diagnosis not present

## 2020-04-27 DIAGNOSIS — N2581 Secondary hyperparathyroidism of renal origin: Secondary | ICD-10-CM | POA: Diagnosis not present

## 2020-04-29 DIAGNOSIS — D631 Anemia in chronic kidney disease: Secondary | ICD-10-CM | POA: Diagnosis not present

## 2020-04-29 DIAGNOSIS — N2581 Secondary hyperparathyroidism of renal origin: Secondary | ICD-10-CM | POA: Diagnosis not present

## 2020-04-29 DIAGNOSIS — N186 End stage renal disease: Secondary | ICD-10-CM | POA: Diagnosis not present

## 2020-04-29 DIAGNOSIS — D689 Coagulation defect, unspecified: Secondary | ICD-10-CM | POA: Diagnosis not present

## 2020-04-29 DIAGNOSIS — D509 Iron deficiency anemia, unspecified: Secondary | ICD-10-CM | POA: Diagnosis not present

## 2020-04-29 DIAGNOSIS — Z992 Dependence on renal dialysis: Secondary | ICD-10-CM | POA: Diagnosis not present

## 2020-05-02 DIAGNOSIS — Z992 Dependence on renal dialysis: Secondary | ICD-10-CM | POA: Diagnosis not present

## 2020-05-02 DIAGNOSIS — N186 End stage renal disease: Secondary | ICD-10-CM | POA: Diagnosis not present

## 2020-05-02 DIAGNOSIS — D509 Iron deficiency anemia, unspecified: Secondary | ICD-10-CM | POA: Diagnosis not present

## 2020-05-02 DIAGNOSIS — D631 Anemia in chronic kidney disease: Secondary | ICD-10-CM | POA: Diagnosis not present

## 2020-05-02 DIAGNOSIS — D689 Coagulation defect, unspecified: Secondary | ICD-10-CM | POA: Diagnosis not present

## 2020-05-02 DIAGNOSIS — N2581 Secondary hyperparathyroidism of renal origin: Secondary | ICD-10-CM | POA: Diagnosis not present

## 2020-05-03 ENCOUNTER — Other Ambulatory Visit: Payer: Self-pay | Admitting: Family Medicine

## 2020-05-04 DIAGNOSIS — D631 Anemia in chronic kidney disease: Secondary | ICD-10-CM | POA: Diagnosis not present

## 2020-05-04 DIAGNOSIS — N2581 Secondary hyperparathyroidism of renal origin: Secondary | ICD-10-CM | POA: Diagnosis not present

## 2020-05-04 DIAGNOSIS — D509 Iron deficiency anemia, unspecified: Secondary | ICD-10-CM | POA: Diagnosis not present

## 2020-05-04 DIAGNOSIS — N186 End stage renal disease: Secondary | ICD-10-CM | POA: Diagnosis not present

## 2020-05-04 DIAGNOSIS — D689 Coagulation defect, unspecified: Secondary | ICD-10-CM | POA: Diagnosis not present

## 2020-05-04 DIAGNOSIS — Z992 Dependence on renal dialysis: Secondary | ICD-10-CM | POA: Diagnosis not present

## 2020-05-05 ENCOUNTER — Ambulatory Visit: Payer: Medicare HMO

## 2020-05-05 DIAGNOSIS — I10 Essential (primary) hypertension: Secondary | ICD-10-CM

## 2020-05-05 DIAGNOSIS — E1122 Type 2 diabetes mellitus with diabetic chronic kidney disease: Secondary | ICD-10-CM

## 2020-05-05 DIAGNOSIS — M10371 Gout due to renal impairment, right ankle and foot: Secondary | ICD-10-CM

## 2020-05-05 DIAGNOSIS — N186 End stage renal disease: Secondary | ICD-10-CM

## 2020-05-05 NOTE — Progress Notes (Signed)
Chronic Care Management Pharmacy  Name: Tyler Hahn  MRN: [id number] DOB: 10-Apr-1941  Chief Complaint/ HPI  Tyler Hahn,  79 y.o. , male presents for their f/u CCM visit with the clinical pharmacist via telephone due to COVID-19 Pandemic. Patient is accompanied by son, Smayan Hackbart.  PCP : [name], MD  Chronic conditions include:  Encounter Diagnoses  Name Primary?   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 03/02/2020   Urinary tract infection associated with indwelling urethral catheter ([place])    Weakness    ESRD (end stage renal disease) ([place]) 08/30/2019   Acute renal failure (ARF) ([place]) 08/18/2019   History of anemia due to CKD 08/18/2019   Hyperkalemia 08/17/2019   Hypothyroid 08/16/2019   Physical exam 02/18/2019   Greater trochanteric bursitis of left hip 08/04/2018   Osteoarthritis 09/25/2016   Screen for colon cancer    Benign neoplasm of transverse colon    Edema 07/28/2014   Adjustment disorder with depressed mood 03/24/2014   Small bowel mass 12/20/2013   Myalgia 07/07/2013   Loss of weight 04/19/2013   Polyarthralgia 04/19/2013   Loss of appetite 04/19/2013   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   Right elbow pain 08/17/2012   Secondary cardiomyopathy ([place]) 66/11/3014   SYSTOLIC HEART FAILURE, CHRONIC 06/07/2010   Chronic kidney disease (CKD), stage IV (severe) ([place]) 04/04/2010   TESTICULAR MASS 04/04/2010   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[place]) 03/07/2010   Hyperlipidemia 03/07/2010   GOUT 03/07/2010   Essential hypertension 03/07/2010   IMPLANTABLE CARDIAC DEFIBRILLATOR -[employer]-SINGLE 03/07/2010   Past Surgical History:  Procedure Laterality Date   AV FISTULA PLACEMENT Right 08/23/2019    Procedure: ARTERIOVENOUS GORTEX GRAFT RIGHT ARM;  Surgeon: Rosetta Posner, MD;  [facility];  Service: Vascular;  Laterality: Right;   CARDIAC DEFIBRILLATOR PLACEMENT     COLONOSCOPY WITH PROPOFOL N/A 10/03/2015   Procedure: COLONOSCOPY WITH PROPOFOL;  Surgeon: Ladene Artist, MD;  [facility];  Service: Endoscopy;  Laterality: N/A;   DIAGNOSTIC LAPAROSCOPY  01/27/2014   Dr Brantley Stage   ENTEROSCOPY N/A 11/25/2013   Procedure: ENTEROSCOPY;  Surgeon: Ladene Artist, MD;  [facility];  Service: Endoscopy;  Laterality: N/A;   ICD,[place] Scentific     LAPAROSCOPY N/A 01/27/2014   Procedure: LAPAROSCOPY DIAGNOSTIC;  Surgeon: Joyice Faster. Cornett, MD;  Location: [facility];  Service: General;  Laterality: N/A;   polyp removal throat     difficulty speaking   Social History   Socioeconomic History   Marital status: Married    Spouse name: Not on file   Number of children: 4   Years of education: Not on file   Highest education level: Not on file  Occupational History   Occupation: Retired    Fish farm manager: RETIRED  Tobacco Use   Smoking status: Former Smoker    Types: Cigarettes    Quit date: 11/26/1998    Years since quitting: 21.4   Smokeless tobacco: Never Used  Vaping Use   Vaping Use: Never used  Substance and Sexual Activity   Alcohol use: No    Alcohol/week: 0.0 standard drinks   Drug use: No   Sexual activity: Not on file  Other Topics Concern   Not on file  Social History Narrative  Not on file   Social Determinants of Health   Financial Resource Strain:    Difficulty of Paying Living Expenses: Not on file  Food Insecurity:    Worried About [place] in the Last Year: Not on file   Ran Out of Food in the Last Year: Not on file  Transportation Needs: No Transportation Needs   Lack of Transportation (Medical): No   Lack of Transportation (Non-Medical): No  Physical Activity:    Days of Exercise per Week: Not on file    Minutes of Exercise per Session: Not on file  Stress:    Feeling of Stress : Not on file  Social Connections:    Frequency of Communication with Friends and Family: Not on file   Frequency of Social Gatherings with Friends and Family: Not on file   Attends Religious Services: Not on file   Active Member of Clubs or Organizations: Not on file   Attends Archivist Meetings: Not on file   Marital Status: Not on file   Family History  Problem Relation Age of Onset   Stomach cancer Mother    Alzheimer's disease Mother    Diabetes Father    Heart disease Father    Liver disease Father    Kidney disease Father    Allergies  Allergen Reactions   Sulfa Antibiotics Itching and Rash   Sulfonamide Derivatives Itching and Rash   Outpatient Encounter Medications as of 05/05/2020  Medication Sig   allopurinol (ZYLOPRIM) 100 MG tablet TAKE 1 TABLET BY MOUTH EVERY DAY   atorvastatin (LIPITOR) 20 MG tablet TAKE 1 TABLET BY MOUTH EVERY DAY   finasteride (PROSCAR) 5 MG tablet Take 1 tablet (5 mg total) by mouth daily.   levothyroxine (SYNTHROID) 25 MCG tablet TAKE 1 TABLET BY MOUTH EVERY DAY BEFORE BREAKFAST   multivitamin (RENA-VIT) TABS tablet Take 1 tablet by mouth at bedtime.   Nutritional Supplements (FEEDING SUPPLEMENT, NEPRO CARB STEADY,) LIQD Take 237 mLs by mouth 2 (two) times daily between meals.   sertraline (ZOLOFT) 100 MG tablet TAKE 1 TABLET BY MOUTH EVERY DAY   No facility-administered encounter medications on file as of 05/05/2020.   Patient Care Team    Relationship Specialty Notifications Start End  [name], MD PCP - General   05/30/10   [name], MD Consulting Physician Cardiology  02/14/15   Renato Shin, MD Consulting Physician Endocrinology  02/14/15   Ladene Artist, MD Consulting Physician Gastroenterology  05/18/15   Rosita Fire, MD Consulting Physician Nephrology  08/17/19   [name], [facility] Pharmacist  Pharmacist Admissions 09/30/19    Comment: PHONE NUMBER [phone]   Current Diagnosis/Assessment: Goals Addressed   None     Diabetes   A1c goal < 7%  Lab Results  Component Value Date/Time   HGBA1C 6.8 (H) 08/16/2019 02:24 PM   HGBA1C 6.7 (H) 02/18/2019 03:07 PM    Pt's family aware of labs needed as f/u from 02/2020 physical.  Taken off of basal insulin. a1c <7% on 08/2019 and 02/2019 readings.  Patient is currently controlled on the following medications:   No current medications for diabetes   Reviewed upcoming labs with pt. Son [name] states he will be taking patient's to get labs in about 1 week.   Plan  Continue current medications   Gout   No issues with gout since April 2021. Patient is currently controlled on the following medications:   Allopurinol 100 mg daily  Plan  Continue current medications.  Consider decreasing allopurinol to 100 mg thrice weekly post HD.   Vaccines   Reviewed and discussed patient's vaccination history.    Immunization History  Administered Date(s) Administered   Fluad Quad(high Dose 65+) 02/18/2019, 02/16/2020   Influenza Whole 04/04/2010, 06/17/2012   Influenza, High Dose Seasonal PF 03/18/2018   Influenza,inj,Quad PF,6+ Mos 03/24/2014, 05/18/2015, 07/04/2017   Moderna SARS-COVID-2 Vaccination 07/20/2019, 08/21/2019   Pneumococcal Conjugate-13 02/14/2015   Pneumococcal Polysaccharide-23 08/15/2011   Will be getting Reports receiving booster moderna vaccine at [facility]. Tdap received 06/2017. Up to date.  Plan  No recommendations at this time.   Medication Management / Care coordination    Receives prescription medications from:  [id number] [address], [place] [address] Phone: [phone] Fax: [phone]  [id number] [place] [place] - [address] AT [facility] [address] [place]  [address] Phone: [phone] Fax: [phone]   Current mail order pill packaging. Denies any issues with current medication management.  Son [name] reports that everything is going well from medication management standpoint.   Plan  Continue current medication management strategy.  Follow up:  CPA: 5 month gen/adh. Schedule RPH telephone visit in 1-2 months.  RPH: 6-7 month phone visit. ______________ Visit Information  SDOH (Social Determinants of Health) assessments performed: Yes. Future Appointments  Date Time Provider [place]  06/12/2020 10:50 AM [facility] DEVICE REMOTES [facility]  07/13/2020  8:35 AM [facility] DEVICE REMOTES [facility]  08/14/2020  8:55 AM [facility] DEVICE REMOTES [facility]  09/11/2020 10:50 AM [facility] DEVICE REMOTES [facility]  10/10/2020  9:25 AM [facility] DEVICE REMOTES [facility]  11/09/2020  9:25 AM [facility] DEVICE REMOTES [facility]  12/11/2020 10:50 AM [facility] DEVICE REMOTES [facility]  01/08/2021  8:40 AM [facility] DEVICE REMOTES [facility]  03/12/2021 10:50 AM [facility] DEVICE REMOTES [facility]   [name], Pharm.D., BCGP Clinical Pharmacist [facility] [phone]

## 2020-05-06 DIAGNOSIS — N2581 Secondary hyperparathyroidism of renal origin: Secondary | ICD-10-CM | POA: Diagnosis not present

## 2020-05-06 DIAGNOSIS — Z992 Dependence on renal dialysis: Secondary | ICD-10-CM | POA: Diagnosis not present

## 2020-05-06 DIAGNOSIS — D509 Iron deficiency anemia, unspecified: Secondary | ICD-10-CM | POA: Diagnosis not present

## 2020-05-06 DIAGNOSIS — N186 End stage renal disease: Secondary | ICD-10-CM | POA: Diagnosis not present

## 2020-05-06 DIAGNOSIS — D631 Anemia in chronic kidney disease: Secondary | ICD-10-CM | POA: Diagnosis not present

## 2020-05-06 DIAGNOSIS — D689 Coagulation defect, unspecified: Secondary | ICD-10-CM | POA: Diagnosis not present

## 2020-05-07 NOTE — Patient Instructions (Signed)
Please review care plan below and call me at [phone] with any questions!  Thank you, Edyth Gunnels., Clinical Pharmacist  Goals Addressed            This Visit's Progress    PharmD Care Plan   On track    CARE PLAN ENTRY (see longitudinal plan of care for additional care plan information)  Current Barriers:   Chronic Disease Management support, education, and care coordination needs related to Hypertension, Hyperlipidemia, Diabetes, Depression, and Gout.    Hypertension  BP Readings from Last 3 Encounters:  09/29/19 133/68  09/22/19 (!) 122/54  09/11/19 112/61    Pharmacist Clinical Goal(s): o Over the next 180 days, patient will work with PharmD and providers to maintain BP goal <140/90  Current regimen:  o No medications at this time o HD tu/th/sat  Interventions: o Continue current management  Patient self care activities - Over the next 180 days, patient will: o Check BP as directed, document, and provide at future appointments o Ensure daily salt intake < 2300 mg/day  Hyperlipidemia Lab Results  Component Value Date/Time   LDLCALC 79 08/16/2019 02:24 PM   [place] 83 07/04/2017 03:55 PM   LDLDIRECT 97.0 06/18/2018 03:31 PM    Pharmacist Clinical Goal(s): o Over the next 180 days, patient will work with PharmD and providers to maintain LDL goal < 100  Current regimen:  o Atorvastatin 20 mg once daily   Interventions: o Continue current management  Patient self care activities - Over the next 180 days, patient will: o Continue current management Diabetes Lab Results  Component Value Date/Time   HGBA1C 6.8 (H) 08/16/2019 02:24 PM   HGBA1C 6.7 (H) 02/18/2019 03:07 PM    Pharmacist Clinical Goal(s): o Over the next 180 days, patient will work with PharmD and providers to maintain A1c goal <7%  Current regimen:  o No diabetes medications at this time   Interventions: o Continue current management  Patient self care activities - Over the next  180 days, patient will: o Check blood sugar as directed, document, and provide at future appointments o Contact provider with any episodes of hypoglycemia  Gout  Pharmacist Clinical Goal(s) o Over the next 180 days, patient will work with PharmD and providers to minimize symptoms of gout   Current regimen:  o Allopurinol 100 mg once daily   Interventions: o Continue current management o Possibly reduce dose if gout controlled at follow-up visit.   Patient self care activities - Over the next 180 days, patient will: o Continue current management  Gout  Pharmacist Clinical Goal(s) o Over the next 180 days, patient will work with PharmD and providers to minimize symptoms of depression  Current regimen:  o Sertraline 100 mg once daily  Interventions: o Continue current management  Patient self care activities - Over the next 180 days, patient will: o Continue current management  Medication management  Pharmacist Clinical Goal(s): o Over the next 180 days, patient will work with PharmD and providers to maintain optimal medication adherence  [facility]   Interventions o Comprehensive medication review performed. o Continue current medication management strategy  Patient self care activities - Over the next 180 days, patient will: o Focus on medication adherence by Continue current management o Take medications as prescribed o Report any questions or concerns to PharmD and/or provider(s)  Initial goal documentation.      The patient verbalized understanding of instructions provided today and agreed to receive a mailed  copy of patient instruction and/or educational materials. Telephone follow up appointment with pharmacy team member scheduled for: See next appointment with "Care Management Staff" under "What's Next" below.   [name], Pharm.D., BCGP Clinical Pharmacist [facility] [phone]

## 2020-05-08 DIAGNOSIS — N186 End stage renal disease: Secondary | ICD-10-CM | POA: Diagnosis not present

## 2020-05-08 DIAGNOSIS — Z992 Dependence on renal dialysis: Secondary | ICD-10-CM | POA: Diagnosis not present

## 2020-05-08 DIAGNOSIS — D509 Iron deficiency anemia, unspecified: Secondary | ICD-10-CM | POA: Diagnosis not present

## 2020-05-08 DIAGNOSIS — N2581 Secondary hyperparathyroidism of renal origin: Secondary | ICD-10-CM | POA: Diagnosis not present

## 2020-05-08 DIAGNOSIS — D631 Anemia in chronic kidney disease: Secondary | ICD-10-CM | POA: Diagnosis not present

## 2020-05-08 DIAGNOSIS — D689 Coagulation defect, unspecified: Secondary | ICD-10-CM | POA: Diagnosis not present

## 2020-05-08 NOTE — Progress Notes (Signed)
I am good with dropping it down to 3x/week.  Thank you!

## 2020-05-10 DIAGNOSIS — D689 Coagulation defect, unspecified: Secondary | ICD-10-CM | POA: Diagnosis not present

## 2020-05-10 DIAGNOSIS — Z992 Dependence on renal dialysis: Secondary | ICD-10-CM | POA: Diagnosis not present

## 2020-05-10 DIAGNOSIS — D631 Anemia in chronic kidney disease: Secondary | ICD-10-CM | POA: Diagnosis not present

## 2020-05-10 DIAGNOSIS — N2581 Secondary hyperparathyroidism of renal origin: Secondary | ICD-10-CM | POA: Diagnosis not present

## 2020-05-10 DIAGNOSIS — D509 Iron deficiency anemia, unspecified: Secondary | ICD-10-CM | POA: Diagnosis not present

## 2020-05-10 DIAGNOSIS — N186 End stage renal disease: Secondary | ICD-10-CM | POA: Diagnosis not present

## 2020-05-13 DIAGNOSIS — D631 Anemia in chronic kidney disease: Secondary | ICD-10-CM | POA: Diagnosis not present

## 2020-05-13 DIAGNOSIS — D689 Coagulation defect, unspecified: Secondary | ICD-10-CM | POA: Diagnosis not present

## 2020-05-13 DIAGNOSIS — N186 End stage renal disease: Secondary | ICD-10-CM | POA: Diagnosis not present

## 2020-05-13 DIAGNOSIS — N2581 Secondary hyperparathyroidism of renal origin: Secondary | ICD-10-CM | POA: Diagnosis not present

## 2020-05-13 DIAGNOSIS — Z992 Dependence on renal dialysis: Secondary | ICD-10-CM | POA: Diagnosis not present

## 2020-05-13 DIAGNOSIS — D509 Iron deficiency anemia, unspecified: Secondary | ICD-10-CM | POA: Diagnosis not present

## 2020-05-15 ENCOUNTER — Other Ambulatory Visit: Payer: Self-pay

## 2020-05-15 ENCOUNTER — Encounter (HOSPITAL_COMMUNITY): Payer: Medicare HMO

## 2020-05-15 DIAGNOSIS — I83899 Varicose veins of unspecified lower extremities with other complications: Secondary | ICD-10-CM

## 2020-05-16 DIAGNOSIS — D631 Anemia in chronic kidney disease: Secondary | ICD-10-CM | POA: Diagnosis not present

## 2020-05-16 DIAGNOSIS — D689 Coagulation defect, unspecified: Secondary | ICD-10-CM | POA: Diagnosis not present

## 2020-05-16 DIAGNOSIS — N186 End stage renal disease: Secondary | ICD-10-CM | POA: Diagnosis not present

## 2020-05-16 DIAGNOSIS — I129 Hypertensive chronic kidney disease with stage 1 through stage 4 chronic kidney disease, or unspecified chronic kidney disease: Secondary | ICD-10-CM | POA: Diagnosis not present

## 2020-05-16 DIAGNOSIS — Z992 Dependence on renal dialysis: Secondary | ICD-10-CM | POA: Diagnosis not present

## 2020-05-16 DIAGNOSIS — D509 Iron deficiency anemia, unspecified: Secondary | ICD-10-CM | POA: Diagnosis not present

## 2020-05-16 DIAGNOSIS — N2581 Secondary hyperparathyroidism of renal origin: Secondary | ICD-10-CM | POA: Diagnosis not present

## 2020-05-17 ENCOUNTER — Encounter: Payer: Medicare HMO | Admitting: Vascular Surgery

## 2020-05-17 ENCOUNTER — Encounter: Payer: Self-pay | Admitting: Vascular Surgery

## 2020-05-17 ENCOUNTER — Ambulatory Visit (HOSPITAL_COMMUNITY)
Admission: RE | Admit: 2020-05-17 | Discharge: 2020-05-17 | Disposition: A | Discharge status: Home / Self Care | Payer: Medicare HMO | Source: Ambulatory Visit | Attending: Vascular Surgery | Admitting: Vascular Surgery

## 2020-05-17 ENCOUNTER — Other Ambulatory Visit: Payer: Self-pay

## 2020-05-17 ENCOUNTER — Ambulatory Visit: Payer: Medicare HMO | Admitting: Vascular Surgery

## 2020-05-17 VITALS — BP 123/73 | HR 140 | Temp 97.9°F | Resp 18 | Ht 67.0 in | Wt 158.7 lb

## 2020-05-17 DIAGNOSIS — I83899 Varicose veins of unspecified lower extremities with other complications: Secondary | ICD-10-CM | POA: Diagnosis not present

## 2020-05-17 DIAGNOSIS — I83813 Varicose veins of bilateral lower extremities with pain: Secondary | ICD-10-CM

## 2020-05-17 NOTE — Progress Notes (Signed)
Referring Physician: Dr. Birdie Riddle  Patient name: Tyler Hahn MRN: [id number] DOB: 06/16/1941 Sex: male  REASON FOR CONSULT: Recent venous bleeding  HPI: Tyler Hahn is a 79 y.o. male, who we have previously seen for placement of hemodialysis access.  Recently he had a spontaneous bleeding episode from his right lateral malleolus.  This stopped with direct pressure but apparently was pretty bloody.  He has never had any other prior bleeds.  He does not really have significant varicose veins other than around his ankles on both sides.  He has no claudication symptoms.  He has no rest pain.  He has no nonhealing wounds.  He is on hemodialysis.  He states he does occasionally get some leg swelling but this is fairly well controlled with dialysis.  He never misses a dialysis session.  He has not worn compression stockings in the past.  Past Medical History:  Diagnosis Date  . Anxiety   . Automatic implantable cardioverter-defibrillator in situ    greg taylor  . CHF (congestive heart failure) ([place])    2000  . Diabetes mellitus    no meds  . Fatty liver   . Gout    "bout 2-3 months ago"-meds helped.  . Hyperlipidemia   . Hypertension   . Hyperthyroidism   . Kidney cysts   . PONV (postoperative nausea and vomiting)   . Renal insufficiency    Past Surgical History:  Procedure Laterality Date  . AV FISTULA PLACEMENT Right 08/23/2019   Procedure: ARTERIOVENOUS GORTEX GRAFT RIGHT ARM;  Surgeon: Rosetta Posner, MD;  Location: [place];  Service: Vascular;  Laterality: Right;  . CARDIAC DEFIBRILLATOR PLACEMENT    . COLONOSCOPY WITH PROPOFOL N/A 10/03/2015   Procedure: COLONOSCOPY WITH PROPOFOL;  Surgeon: Ladene Artist, MD;  [facility];  Service: Endoscopy;  Laterality: N/A;  . DIAGNOSTIC LAPAROSCOPY  01/27/2014   Dr Brantley Stage  . ENTEROSCOPY N/A 11/25/2013   Procedure: ENTEROSCOPY;  Surgeon: Ladene Artist, MD;  [facility];  Service: Endoscopy;  Laterality: N/A;    . ICD,[place] [employer]    . LAPAROSCOPY N/A 01/27/2014   Procedure: LAPAROSCOPY DIAGNOSTIC;  Surgeon: Joyice Faster. Cornett, MD;  Location: [place];  Service: General;  Laterality: N/A;  . polyp removal throat     difficulty speaking    Family History  Problem Relation Age of Onset  . Stomach cancer Mother   . Alzheimer's disease Mother   . Diabetes Father   . Heart disease Father   . Liver disease Father   . Kidney disease Father     SOCIAL HISTORY: Social History   Socioeconomic History  . Marital status: Married    Spouse name: Not on file  . Number of children: 4  . Years of education: Not on file  . Highest education level: Not on file  Occupational History  . Occupation: Retired    Fish farm manager: RETIRED  Tobacco Use  . Smoking status: Former Smoker    Types: Cigarettes    Quit date: 11/26/1998    Years since quitting: 21.4  . Smokeless tobacco: Never Used  Vaping Use  . Vaping Use: Never used  Substance and Sexual Activity  . Alcohol use: No    Alcohol/week: 0.0 standard drinks  . Drug use: No  . Sexual activity: Not on file  Other Topics Concern  . Not on file  Social History Narrative  . Not on file   Social Determinants of Health   Financial  Resource Strain:   . Difficulty of Paying Living Expenses: Not on file  Food Insecurity:   . Worried About Charity fundraiser in the Last Year: Not on file  . Ran Out of Food in the Last Year: Not on file  Transportation Needs: No Transportation Needs  . Lack of Transportation (Medical): No  . Lack of Transportation (Non-Medical): No  Physical Activity:   . Days of Exercise per Week: Not on file  . Minutes of Exercise per Session: Not on file  Stress:   . Feeling of Stress : Not on file  Social Connections:   . Frequency of Communication with Friends and Family: Not on file  . Frequency of Social Gatherings with Friends and Family: Not on file  . Attends Religious Services: Not on file  . Active Member of Clubs or  Organizations: Not on file  . Attends Archivist Meetings: Not on file  . Marital Status: Not on file  Intimate Partner Violence:   . Fear of Current or Ex-Partner: Not on file  . Emotionally Abused: Not on file  . Physically Abused: Not on file  . Sexually Abused: Not on file    Allergies  Allergen Reactions  . Sulfa Antibiotics Itching and Rash  . Sulfonamide Derivatives Itching and Rash    Current Outpatient Medications  Medication Sig Dispense Refill  . allopurinol (ZYLOPRIM) 100 MG tablet TAKE 1 TABLET BY MOUTH EVERY DAY 30 tablet 1  . atorvastatin (LIPITOR) 20 MG tablet TAKE 1 TABLET BY MOUTH EVERY DAY 30 tablet 5  . finasteride (PROSCAR) 5 MG tablet Take 1 tablet (5 mg total) by mouth daily. 30 tablet 0  . levothyroxine (SYNTHROID) 25 MCG tablet TAKE 1 TABLET BY MOUTH EVERY DAY BEFORE BREAKFAST 30 tablet 5  . multivitamin (RENA-VIT) TABS tablet Take 1 tablet by mouth at bedtime. 30 tablet 0  . Nutritional Supplements (FEEDING SUPPLEMENT, NEPRO CARB STEADY,) LIQD Take 237 mLs by mouth 2 (two) times daily between meals. 14220 mL 0  . sertraline (ZOLOFT) 100 MG tablet TAKE 1 TABLET BY MOUTH EVERY DAY 30 tablet 2   No current facility-administered medications for this visit.    ROS:   General:  No weight loss, Fever, chills  HEENT: No recent headaches, no nasal bleeding, no visual changes, no sore throat  Neurologic: No dizziness, blackouts, seizures. No recent symptoms of stroke or mini- stroke. No recent episodes of slurred speech, or temporary blindness.  Cardiac: No recent episodes of chest pain/pressure, no shortness of breath at rest.  No shortness of breath with exertion.  Denies history of atrial fibrillation or irregular heartbeat  Vascular: No history of rest pain in feet.  No history of claudication.  No history of non-healing ulcer, No history of DVT   Pulmonary: No home oxygen, no productive cough, no hemoptysis,  No asthma or  wheezing  Musculoskeletal:  [ ]  Arthritis, [ ]  Low back pain,  [ ]  Joint pain  Hematologic:No history of hypercoagulable state.  No history of easy bleeding.  No history of anemia  Gastrointestinal: No hematochezia or melena,  No gastroesophageal reflux, no trouble swallowing  Urinary: [X]  chronic Kidney disease, [X]  on HD - [ ]  MWF or [ ]  TTHS, [ ]  Burning with urination, [ ]  Frequent urination, [ ]  Difficulty urinating;   Skin: No rashes  Psychological: No history of anxiety,  No history of depression   Physical Examination   Vitals:   05/17/20 1151  BP: 123/73  Pulse: (!) 140  Resp: 18  Temp: 97.9 F (36.6 C)  TempSrc: Temporal  SpO2: 92%  Weight: 158 lb 11.2 oz (72 kg)  Height: 5\' 7"  (1.702 m)    General:  Alert and oriented, no acute distress HEENT: Normal Neck: No JVD Cardiac: Regular Rate and Rhythm Skin: No rash, diffuse reticular type varicosities around the ankle and foot bilaterally.  No open wound.  Thickened jagged long toenails bilateral feet Extremity Pulses: Absent dorsalis pedis, posterior tibial pulses bilaterally Musculoskeletal: No deformity or edema  Neurologic: Upper and lower extremity motor 5/5 and symmetric  DATA:  Patient had a lower extremity venous reflux exam today which showed no evidence of deep or superficial venous reflux and normal vein diameter.  ASSESSMENT: 1.  Asymptomatic peripheral arterial disease with nonpalpable pulses in both feet.  Patient was advised to come back to see [place] if he develops claudication symptoms rest pain or nonhealing wounds.  2.  Reticular type varicosities with recent bleed from right lateral malleolus.  Patient would benefit from wearing compression stockings and we will also see if we can get insurance approval to do sclerotherapy injection of his right lateral malleolus to prevent further bleeding episodes.  3.  Thickened nails bilaterally we will refer him to podiatry for care of this to prevent nail  avulsion.   PLAN: See above   Ruta Hinds, MD [facility] Office: [phone]

## 2020-05-18 DIAGNOSIS — N186 End stage renal disease: Secondary | ICD-10-CM | POA: Diagnosis not present

## 2020-05-18 DIAGNOSIS — D509 Iron deficiency anemia, unspecified: Secondary | ICD-10-CM | POA: Diagnosis not present

## 2020-05-18 DIAGNOSIS — D689 Coagulation defect, unspecified: Secondary | ICD-10-CM | POA: Diagnosis not present

## 2020-05-18 DIAGNOSIS — N2581 Secondary hyperparathyroidism of renal origin: Secondary | ICD-10-CM | POA: Diagnosis not present

## 2020-05-18 DIAGNOSIS — D631 Anemia in chronic kidney disease: Secondary | ICD-10-CM | POA: Diagnosis not present

## 2020-05-18 DIAGNOSIS — Z992 Dependence on renal dialysis: Secondary | ICD-10-CM | POA: Diagnosis not present

## 2020-05-20 DIAGNOSIS — N2581 Secondary hyperparathyroidism of renal origin: Secondary | ICD-10-CM | POA: Diagnosis not present

## 2020-05-20 DIAGNOSIS — D631 Anemia in chronic kidney disease: Secondary | ICD-10-CM | POA: Diagnosis not present

## 2020-05-20 DIAGNOSIS — Z992 Dependence on renal dialysis: Secondary | ICD-10-CM | POA: Diagnosis not present

## 2020-05-20 DIAGNOSIS — N186 End stage renal disease: Secondary | ICD-10-CM | POA: Diagnosis not present

## 2020-05-20 DIAGNOSIS — D689 Coagulation defect, unspecified: Secondary | ICD-10-CM | POA: Diagnosis not present

## 2020-05-20 DIAGNOSIS — D509 Iron deficiency anemia, unspecified: Secondary | ICD-10-CM | POA: Diagnosis not present

## 2020-05-23 DIAGNOSIS — N186 End stage renal disease: Secondary | ICD-10-CM | POA: Diagnosis not present

## 2020-05-23 DIAGNOSIS — D509 Iron deficiency anemia, unspecified: Secondary | ICD-10-CM | POA: Diagnosis not present

## 2020-05-23 DIAGNOSIS — D689 Coagulation defect, unspecified: Secondary | ICD-10-CM | POA: Diagnosis not present

## 2020-05-23 DIAGNOSIS — Z992 Dependence on renal dialysis: Secondary | ICD-10-CM | POA: Diagnosis not present

## 2020-05-23 DIAGNOSIS — D631 Anemia in chronic kidney disease: Secondary | ICD-10-CM | POA: Diagnosis not present

## 2020-05-23 DIAGNOSIS — N2581 Secondary hyperparathyroidism of renal origin: Secondary | ICD-10-CM | POA: Diagnosis not present

## 2020-05-25 DIAGNOSIS — D631 Anemia in chronic kidney disease: Secondary | ICD-10-CM | POA: Diagnosis not present

## 2020-05-25 DIAGNOSIS — Z992 Dependence on renal dialysis: Secondary | ICD-10-CM | POA: Diagnosis not present

## 2020-05-25 DIAGNOSIS — D689 Coagulation defect, unspecified: Secondary | ICD-10-CM | POA: Diagnosis not present

## 2020-05-25 DIAGNOSIS — D509 Iron deficiency anemia, unspecified: Secondary | ICD-10-CM | POA: Diagnosis not present

## 2020-05-25 DIAGNOSIS — N186 End stage renal disease: Secondary | ICD-10-CM | POA: Diagnosis not present

## 2020-05-25 DIAGNOSIS — N2581 Secondary hyperparathyroidism of renal origin: Secondary | ICD-10-CM | POA: Diagnosis not present

## 2020-05-27 DIAGNOSIS — D509 Iron deficiency anemia, unspecified: Secondary | ICD-10-CM | POA: Diagnosis not present

## 2020-05-27 DIAGNOSIS — N186 End stage renal disease: Secondary | ICD-10-CM | POA: Diagnosis not present

## 2020-05-27 DIAGNOSIS — N2581 Secondary hyperparathyroidism of renal origin: Secondary | ICD-10-CM | POA: Diagnosis not present

## 2020-05-27 DIAGNOSIS — Z992 Dependence on renal dialysis: Secondary | ICD-10-CM | POA: Diagnosis not present

## 2020-05-27 DIAGNOSIS — D689 Coagulation defect, unspecified: Secondary | ICD-10-CM | POA: Diagnosis not present

## 2020-05-27 DIAGNOSIS — D631 Anemia in chronic kidney disease: Secondary | ICD-10-CM | POA: Diagnosis not present

## 2020-05-29 ENCOUNTER — Ambulatory Visit: Payer: Medicare HMO | Admitting: Podiatry

## 2020-05-30 DIAGNOSIS — Z992 Dependence on renal dialysis: Secondary | ICD-10-CM | POA: Diagnosis not present

## 2020-05-30 DIAGNOSIS — D509 Iron deficiency anemia, unspecified: Secondary | ICD-10-CM | POA: Diagnosis not present

## 2020-05-30 DIAGNOSIS — D631 Anemia in chronic kidney disease: Secondary | ICD-10-CM | POA: Diagnosis not present

## 2020-05-30 DIAGNOSIS — N2581 Secondary hyperparathyroidism of renal origin: Secondary | ICD-10-CM | POA: Diagnosis not present

## 2020-05-30 DIAGNOSIS — D689 Coagulation defect, unspecified: Secondary | ICD-10-CM | POA: Diagnosis not present

## 2020-05-30 DIAGNOSIS — N186 End stage renal disease: Secondary | ICD-10-CM | POA: Diagnosis not present

## 2020-06-01 DIAGNOSIS — D689 Coagulation defect, unspecified: Secondary | ICD-10-CM | POA: Diagnosis not present

## 2020-06-01 DIAGNOSIS — Z992 Dependence on renal dialysis: Secondary | ICD-10-CM | POA: Diagnosis not present

## 2020-06-01 DIAGNOSIS — N2581 Secondary hyperparathyroidism of renal origin: Secondary | ICD-10-CM | POA: Diagnosis not present

## 2020-06-01 DIAGNOSIS — D631 Anemia in chronic kidney disease: Secondary | ICD-10-CM | POA: Diagnosis not present

## 2020-06-01 DIAGNOSIS — N186 End stage renal disease: Secondary | ICD-10-CM | POA: Diagnosis not present

## 2020-06-01 DIAGNOSIS — D509 Iron deficiency anemia, unspecified: Secondary | ICD-10-CM | POA: Diagnosis not present

## 2020-06-03 DIAGNOSIS — D509 Iron deficiency anemia, unspecified: Secondary | ICD-10-CM | POA: Diagnosis not present

## 2020-06-03 DIAGNOSIS — N2581 Secondary hyperparathyroidism of renal origin: Secondary | ICD-10-CM | POA: Diagnosis not present

## 2020-06-03 DIAGNOSIS — D631 Anemia in chronic kidney disease: Secondary | ICD-10-CM | POA: Diagnosis not present

## 2020-06-03 DIAGNOSIS — Z992 Dependence on renal dialysis: Secondary | ICD-10-CM | POA: Diagnosis not present

## 2020-06-03 DIAGNOSIS — N186 End stage renal disease: Secondary | ICD-10-CM | POA: Diagnosis not present

## 2020-06-03 DIAGNOSIS — D689 Coagulation defect, unspecified: Secondary | ICD-10-CM | POA: Diagnosis not present

## 2020-06-06 DIAGNOSIS — Z992 Dependence on renal dialysis: Secondary | ICD-10-CM | POA: Diagnosis not present

## 2020-06-06 DIAGNOSIS — N186 End stage renal disease: Secondary | ICD-10-CM | POA: Diagnosis not present

## 2020-06-06 DIAGNOSIS — D689 Coagulation defect, unspecified: Secondary | ICD-10-CM | POA: Diagnosis not present

## 2020-06-06 DIAGNOSIS — D509 Iron deficiency anemia, unspecified: Secondary | ICD-10-CM | POA: Diagnosis not present

## 2020-06-06 DIAGNOSIS — N2581 Secondary hyperparathyroidism of renal origin: Secondary | ICD-10-CM | POA: Diagnosis not present

## 2020-06-06 DIAGNOSIS — D631 Anemia in chronic kidney disease: Secondary | ICD-10-CM | POA: Diagnosis not present

## 2020-06-08 DIAGNOSIS — N186 End stage renal disease: Secondary | ICD-10-CM | POA: Diagnosis not present

## 2020-06-08 DIAGNOSIS — N2581 Secondary hyperparathyroidism of renal origin: Secondary | ICD-10-CM | POA: Diagnosis not present

## 2020-06-08 DIAGNOSIS — D631 Anemia in chronic kidney disease: Secondary | ICD-10-CM | POA: Diagnosis not present

## 2020-06-08 DIAGNOSIS — D689 Coagulation defect, unspecified: Secondary | ICD-10-CM | POA: Diagnosis not present

## 2020-06-08 DIAGNOSIS — D509 Iron deficiency anemia, unspecified: Secondary | ICD-10-CM | POA: Diagnosis not present

## 2020-06-08 DIAGNOSIS — Z992 Dependence on renal dialysis: Secondary | ICD-10-CM | POA: Diagnosis not present

## 2020-06-11 DIAGNOSIS — D631 Anemia in chronic kidney disease: Secondary | ICD-10-CM | POA: Diagnosis not present

## 2020-06-11 DIAGNOSIS — Z992 Dependence on renal dialysis: Secondary | ICD-10-CM | POA: Diagnosis not present

## 2020-06-11 DIAGNOSIS — D689 Coagulation defect, unspecified: Secondary | ICD-10-CM | POA: Diagnosis not present

## 2020-06-11 DIAGNOSIS — D509 Iron deficiency anemia, unspecified: Secondary | ICD-10-CM | POA: Diagnosis not present

## 2020-06-11 DIAGNOSIS — N2581 Secondary hyperparathyroidism of renal origin: Secondary | ICD-10-CM | POA: Diagnosis not present

## 2020-06-11 DIAGNOSIS — N186 End stage renal disease: Secondary | ICD-10-CM | POA: Diagnosis not present

## 2020-06-12 ENCOUNTER — Ambulatory Visit (INDEPENDENT_AMBULATORY_CARE_PROVIDER_SITE_OTHER): Payer: Medicare HMO

## 2020-06-12 DIAGNOSIS — I429 Cardiomyopathy, unspecified: Secondary | ICD-10-CM

## 2020-06-12 DIAGNOSIS — I5022 Chronic systolic (congestive) heart failure: Secondary | ICD-10-CM

## 2020-06-13 DIAGNOSIS — Z992 Dependence on renal dialysis: Secondary | ICD-10-CM | POA: Diagnosis not present

## 2020-06-13 DIAGNOSIS — N186 End stage renal disease: Secondary | ICD-10-CM | POA: Diagnosis not present

## 2020-06-13 DIAGNOSIS — D631 Anemia in chronic kidney disease: Secondary | ICD-10-CM | POA: Diagnosis not present

## 2020-06-13 DIAGNOSIS — N2581 Secondary hyperparathyroidism of renal origin: Secondary | ICD-10-CM | POA: Diagnosis not present

## 2020-06-13 DIAGNOSIS — D509 Iron deficiency anemia, unspecified: Secondary | ICD-10-CM | POA: Diagnosis not present

## 2020-06-13 DIAGNOSIS — D689 Coagulation defect, unspecified: Secondary | ICD-10-CM | POA: Diagnosis not present

## 2020-06-13 LAB — CUP PACEART REMOTE DEVICE CHECK
Battery Remaining Longevity: 5 mo
Battery Remaining Percentage: 5 %
Brady Statistic RA Percent Paced: 0 %
Brady Statistic RV Percent Paced: 1 %
Date Time Interrogation Session: 20211227025800
HighPow Impedance: 56 Ohm
Implantable Lead Implant Date: 20091218
Implantable Lead Implant Date: 20091218
Implantable Lead Location: 753859
Implantable Lead Location: 753860
Implantable Lead Model: 158
Implantable Lead Serial Number: 131093
Implantable Pulse Generator Implant Date: 20091218
Lead Channel Impedance Value: 421 Ohm
Lead Channel Impedance Value: 550 Ohm
Lead Channel Pacing Threshold Amplitude: 0.6 V
Lead Channel Pacing Threshold Amplitude: 0.8 V
Lead Channel Pacing Threshold Pulse Width: 0.5 ms
Lead Channel Pacing Threshold Pulse Width: 0.5 ms
Lead Channel Setting Pacing Amplitude: 2 V
Lead Channel Setting Pacing Amplitude: 2.4 V
Lead Channel Setting Pacing Pulse Width: 0.5 ms
Lead Channel Setting Sensing Sensitivity: 0.6 mV
Pulse Gen Serial Number: 131093

## 2020-06-15 DIAGNOSIS — Z992 Dependence on renal dialysis: Secondary | ICD-10-CM | POA: Diagnosis not present

## 2020-06-15 DIAGNOSIS — N2581 Secondary hyperparathyroidism of renal origin: Secondary | ICD-10-CM | POA: Diagnosis not present

## 2020-06-15 DIAGNOSIS — D689 Coagulation defect, unspecified: Secondary | ICD-10-CM | POA: Diagnosis not present

## 2020-06-15 DIAGNOSIS — D631 Anemia in chronic kidney disease: Secondary | ICD-10-CM | POA: Diagnosis not present

## 2020-06-15 DIAGNOSIS — N186 End stage renal disease: Secondary | ICD-10-CM | POA: Diagnosis not present

## 2020-06-15 DIAGNOSIS — D509 Iron deficiency anemia, unspecified: Secondary | ICD-10-CM | POA: Diagnosis not present

## 2020-06-16 DIAGNOSIS — I129 Hypertensive chronic kidney disease with stage 1 through stage 4 chronic kidney disease, or unspecified chronic kidney disease: Secondary | ICD-10-CM | POA: Diagnosis not present

## 2020-06-16 DIAGNOSIS — Z992 Dependence on renal dialysis: Secondary | ICD-10-CM | POA: Diagnosis not present

## 2020-06-16 DIAGNOSIS — N186 End stage renal disease: Secondary | ICD-10-CM | POA: Diagnosis not present

## 2020-06-18 DIAGNOSIS — Z992 Dependence on renal dialysis: Secondary | ICD-10-CM | POA: Diagnosis not present

## 2020-06-18 DIAGNOSIS — D689 Coagulation defect, unspecified: Secondary | ICD-10-CM | POA: Diagnosis not present

## 2020-06-18 DIAGNOSIS — N2581 Secondary hyperparathyroidism of renal origin: Secondary | ICD-10-CM | POA: Diagnosis not present

## 2020-06-18 DIAGNOSIS — D509 Iron deficiency anemia, unspecified: Secondary | ICD-10-CM | POA: Diagnosis not present

## 2020-06-18 DIAGNOSIS — D631 Anemia in chronic kidney disease: Secondary | ICD-10-CM | POA: Diagnosis not present

## 2020-06-18 DIAGNOSIS — N186 End stage renal disease: Secondary | ICD-10-CM | POA: Diagnosis not present

## 2020-06-20 DIAGNOSIS — D509 Iron deficiency anemia, unspecified: Secondary | ICD-10-CM | POA: Diagnosis not present

## 2020-06-20 DIAGNOSIS — N186 End stage renal disease: Secondary | ICD-10-CM | POA: Diagnosis not present

## 2020-06-20 DIAGNOSIS — D689 Coagulation defect, unspecified: Secondary | ICD-10-CM | POA: Diagnosis not present

## 2020-06-20 DIAGNOSIS — Z992 Dependence on renal dialysis: Secondary | ICD-10-CM | POA: Diagnosis not present

## 2020-06-20 DIAGNOSIS — D631 Anemia in chronic kidney disease: Secondary | ICD-10-CM | POA: Diagnosis not present

## 2020-06-20 DIAGNOSIS — N2581 Secondary hyperparathyroidism of renal origin: Secondary | ICD-10-CM | POA: Diagnosis not present

## 2020-06-22 DIAGNOSIS — D631 Anemia in chronic kidney disease: Secondary | ICD-10-CM | POA: Diagnosis not present

## 2020-06-22 DIAGNOSIS — D689 Coagulation defect, unspecified: Secondary | ICD-10-CM | POA: Diagnosis not present

## 2020-06-22 DIAGNOSIS — Z992 Dependence on renal dialysis: Secondary | ICD-10-CM | POA: Diagnosis not present

## 2020-06-22 DIAGNOSIS — N186 End stage renal disease: Secondary | ICD-10-CM | POA: Diagnosis not present

## 2020-06-22 DIAGNOSIS — D509 Iron deficiency anemia, unspecified: Secondary | ICD-10-CM | POA: Diagnosis not present

## 2020-06-22 DIAGNOSIS — N2581 Secondary hyperparathyroidism of renal origin: Secondary | ICD-10-CM | POA: Diagnosis not present

## 2020-06-23 NOTE — Progress Notes (Signed)
Remote ICD transmission.   

## 2020-06-24 DIAGNOSIS — D631 Anemia in chronic kidney disease: Secondary | ICD-10-CM | POA: Diagnosis not present

## 2020-06-24 DIAGNOSIS — N2581 Secondary hyperparathyroidism of renal origin: Secondary | ICD-10-CM | POA: Diagnosis not present

## 2020-06-24 DIAGNOSIS — Z992 Dependence on renal dialysis: Secondary | ICD-10-CM | POA: Diagnosis not present

## 2020-06-24 DIAGNOSIS — D509 Iron deficiency anemia, unspecified: Secondary | ICD-10-CM | POA: Diagnosis not present

## 2020-06-24 DIAGNOSIS — N186 End stage renal disease: Secondary | ICD-10-CM | POA: Diagnosis not present

## 2020-06-24 DIAGNOSIS — D689 Coagulation defect, unspecified: Secondary | ICD-10-CM | POA: Diagnosis not present

## 2020-06-26 ENCOUNTER — Ambulatory Visit (INDEPENDENT_AMBULATORY_CARE_PROVIDER_SITE_OTHER): Payer: Medicare HMO

## 2020-06-26 ENCOUNTER — Other Ambulatory Visit: Payer: Self-pay

## 2020-06-26 DIAGNOSIS — I8393 Asymptomatic varicose veins of bilateral lower extremities: Secondary | ICD-10-CM | POA: Diagnosis not present

## 2020-06-26 NOTE — Progress Notes (Signed)
Treated pt's reticular veins and spider veins in lateral R LE, near ankle. A total of 2 mL of Asclera 1% was used, administered with a 27g butterfly.  MD assessed area to confirm with Sonosite prior to starting treatment. Pt and his son were given post procedure care instructions both verbally and on handout.  Will follow PRN  Photos: Yes.    Compression stockings applied: Yes.

## 2020-06-27 DIAGNOSIS — D509 Iron deficiency anemia, unspecified: Secondary | ICD-10-CM | POA: Diagnosis not present

## 2020-06-27 DIAGNOSIS — D631 Anemia in chronic kidney disease: Secondary | ICD-10-CM | POA: Diagnosis not present

## 2020-06-27 DIAGNOSIS — N2581 Secondary hyperparathyroidism of renal origin: Secondary | ICD-10-CM | POA: Diagnosis not present

## 2020-06-27 DIAGNOSIS — N186 End stage renal disease: Secondary | ICD-10-CM | POA: Diagnosis not present

## 2020-06-27 DIAGNOSIS — D689 Coagulation defect, unspecified: Secondary | ICD-10-CM | POA: Diagnosis not present

## 2020-06-27 DIAGNOSIS — Z992 Dependence on renal dialysis: Secondary | ICD-10-CM | POA: Diagnosis not present

## 2020-06-29 DIAGNOSIS — D689 Coagulation defect, unspecified: Secondary | ICD-10-CM | POA: Diagnosis not present

## 2020-06-29 DIAGNOSIS — Z992 Dependence on renal dialysis: Secondary | ICD-10-CM | POA: Diagnosis not present

## 2020-06-29 DIAGNOSIS — D509 Iron deficiency anemia, unspecified: Secondary | ICD-10-CM | POA: Diagnosis not present

## 2020-06-29 DIAGNOSIS — N186 End stage renal disease: Secondary | ICD-10-CM | POA: Diagnosis not present

## 2020-06-29 DIAGNOSIS — N2581 Secondary hyperparathyroidism of renal origin: Secondary | ICD-10-CM | POA: Diagnosis not present

## 2020-06-29 DIAGNOSIS — D631 Anemia in chronic kidney disease: Secondary | ICD-10-CM | POA: Diagnosis not present

## 2020-07-01 DIAGNOSIS — N2581 Secondary hyperparathyroidism of renal origin: Secondary | ICD-10-CM | POA: Diagnosis not present

## 2020-07-01 DIAGNOSIS — N186 End stage renal disease: Secondary | ICD-10-CM | POA: Diagnosis not present

## 2020-07-01 DIAGNOSIS — D509 Iron deficiency anemia, unspecified: Secondary | ICD-10-CM | POA: Diagnosis not present

## 2020-07-01 DIAGNOSIS — D631 Anemia in chronic kidney disease: Secondary | ICD-10-CM | POA: Diagnosis not present

## 2020-07-01 DIAGNOSIS — D689 Coagulation defect, unspecified: Secondary | ICD-10-CM | POA: Diagnosis not present

## 2020-07-01 DIAGNOSIS — Z992 Dependence on renal dialysis: Secondary | ICD-10-CM | POA: Diagnosis not present

## 2020-07-02 ENCOUNTER — Other Ambulatory Visit: Payer: Self-pay | Admitting: Family Medicine

## 2020-07-03 ENCOUNTER — Telehealth: Payer: Self-pay | Admitting: Emergency Medicine

## 2020-07-03 NOTE — Telephone Encounter (Addendum)
Contacted patient by home phone and advised patient that the device clinic would be conducting monthly battery checks on patient's device. Patient is scheduled for monthly battery checks. Last remote 06/25/2020. Approximate time to explant from last remote 5 months. Battery longevity 1 year. Next scheduled remote battery check 07/13/2020. Patient stated he had no questions at this time.

## 2020-07-04 DIAGNOSIS — N2581 Secondary hyperparathyroidism of renal origin: Secondary | ICD-10-CM | POA: Diagnosis not present

## 2020-07-04 DIAGNOSIS — D689 Coagulation defect, unspecified: Secondary | ICD-10-CM | POA: Diagnosis not present

## 2020-07-04 DIAGNOSIS — Z992 Dependence on renal dialysis: Secondary | ICD-10-CM | POA: Diagnosis not present

## 2020-07-04 DIAGNOSIS — D509 Iron deficiency anemia, unspecified: Secondary | ICD-10-CM | POA: Diagnosis not present

## 2020-07-04 DIAGNOSIS — D631 Anemia in chronic kidney disease: Secondary | ICD-10-CM | POA: Diagnosis not present

## 2020-07-04 DIAGNOSIS — N186 End stage renal disease: Secondary | ICD-10-CM | POA: Diagnosis not present

## 2020-07-06 DIAGNOSIS — N186 End stage renal disease: Secondary | ICD-10-CM | POA: Diagnosis not present

## 2020-07-06 DIAGNOSIS — D689 Coagulation defect, unspecified: Secondary | ICD-10-CM | POA: Diagnosis not present

## 2020-07-06 DIAGNOSIS — D509 Iron deficiency anemia, unspecified: Secondary | ICD-10-CM | POA: Diagnosis not present

## 2020-07-06 DIAGNOSIS — N2581 Secondary hyperparathyroidism of renal origin: Secondary | ICD-10-CM | POA: Diagnosis not present

## 2020-07-06 DIAGNOSIS — D631 Anemia in chronic kidney disease: Secondary | ICD-10-CM | POA: Diagnosis not present

## 2020-07-06 DIAGNOSIS — Z992 Dependence on renal dialysis: Secondary | ICD-10-CM | POA: Diagnosis not present

## 2020-07-08 DIAGNOSIS — D509 Iron deficiency anemia, unspecified: Secondary | ICD-10-CM | POA: Diagnosis not present

## 2020-07-08 DIAGNOSIS — D689 Coagulation defect, unspecified: Secondary | ICD-10-CM | POA: Diagnosis not present

## 2020-07-08 DIAGNOSIS — N2581 Secondary hyperparathyroidism of renal origin: Secondary | ICD-10-CM | POA: Diagnosis not present

## 2020-07-08 DIAGNOSIS — D631 Anemia in chronic kidney disease: Secondary | ICD-10-CM | POA: Diagnosis not present

## 2020-07-08 DIAGNOSIS — Z992 Dependence on renal dialysis: Secondary | ICD-10-CM | POA: Diagnosis not present

## 2020-07-08 DIAGNOSIS — N186 End stage renal disease: Secondary | ICD-10-CM | POA: Diagnosis not present

## 2020-07-10 ENCOUNTER — Telehealth: Payer: Self-pay

## 2020-07-10 NOTE — Chronic Care Management (AMB) (Signed)
Chronic Care Management Pharmacy Assistant   Name: Tyler Hahn  MRN: [id number] DOB: 09-16-1940  Reason for Encounter: Disease State/ General Adherence Call  PCP : Tyler Minium, MD  Allergies:   Allergies  Allergen Reactions  . Sulfa Antibiotics Itching and Rash  . Sulfonamide Derivatives Itching and Rash    Medications: Outpatient Encounter Medications as of 07/10/2020  Medication Sig  . allopurinol (ZYLOPRIM) 100 MG tablet TAKE 1 TABLET BY MOUTH EVERY DAY  . atorvastatin (LIPITOR) 20 MG tablet TAKE 1 TABLET BY MOUTH EVERY DAY  . finasteride (PROSCAR) 5 MG tablet Take 1 tablet (5 mg total) by mouth daily.  Marland Kitchen levothyroxine (SYNTHROID) 25 MCG tablet TAKE 1 TABLET BY MOUTH EVERY DAY BEFORE BREAKFAST  . multivitamin (RENA-VIT) TABS tablet Take 1 tablet by mouth at bedtime.  . Nutritional Supplements (FEEDING SUPPLEMENT, NEPRO CARB STEADY,) LIQD Take 237 mLs by mouth 2 (two) times daily between meals.  . sertraline (ZOLOFT) 100 MG tablet TAKE 1 TABLET BY MOUTH EVERY DAY   No facility-administered encounter medications on file as of 07/10/2020.    Current Diagnosis: Patient Active Problem List   Diagnosis Date Noted  . 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 Urinary tract infection associated with indwelling urethral catheter ([place])   . Weakness   . ESRD (end stage renal disease) ([place]) 08/30/2019  . Acute renal failure (ARF) ([place]) 08/18/2019  . History of anemia due to CKD 08/18/2019  . Hyperkalemia 08/17/2019  . Hypothyroid 08/16/2019  . Physical exam 02/18/2019  . Greater trochanteric bursitis of left hip 08/04/2018  . Osteoarthritis 09/25/2016  . Screen for colon cancer   . Benign neoplasm of transverse colon   . Edema 07/28/2014  . Adjustment disorder with depressed mood 03/24/2014  . Small bowel mass 12/20/2013  . Myalgia 07/07/2013  . Loss of weight 04/19/2013  . Polyarthralgia 04/19/2013  . Loss of appetite 04/19/2013  .  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 Right elbow pain 08/17/2012  . 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 SYSTOLIC HEART FAILURE, CHRONIC 06/07/2010  . Chronic kidney disease (CKD), stage IV (severe) ([place]) 04/04/2010  . TESTICULAR MASS 04/04/2010  . 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[place]) 03/07/2010  . Hyperlipidemia 03/07/2010  . GOUT 03/07/2010  . Essential hypertension 03/07/2010  . IMPLANTABLE CARDIAC DEFIBRILLATOR -[employer]-SINGLE 03/07/2010    Have you seen any other providers since your last visit with Tyler Hahn, Pharm.D., BCGP?  Patient went to dialysis twice a week from dates 03/16/2020-04/16/2020 at [facility], 04/17/2020 [facility], Tyler Sprang, MD, 04/25/2020 ED Extremity Laceration, Hahn, Tyler Hair, MD, 04/26/2020 OV Venous Bleed, Tyler Minium, MD, 05/17/2020 OV Vasc Surg, Varicose Veins Tyler Dutch, MD. No medication changes noted during these visits.  Have you had any problems recently with your health?  Patients son states the patients memory is getting worse.   Have you had any problems with your pharmacy?  Patients son states the patient has not had any problems with his pharmacy.  What issues or side effects are you having with your medications?  Patients son states the patient is not currently having any issues or side effects from his medications.  What would you like me to pass along to Tyler Hahn, [place].D., BCGP for them to help you with?   Patients son would like to know if there is anything the patient can take to help with his memory other than taking  over the counter vitamins.  What can we do to take care of you better?  Patient states he would like for [place] to help improve his memory if possible.  Tyler Hahn, [place] Pharmacist Assistant [phone] '  Follow-Up:  Pharmacist Review

## 2020-07-11 DIAGNOSIS — D509 Iron deficiency anemia, unspecified: Secondary | ICD-10-CM | POA: Diagnosis not present

## 2020-07-11 DIAGNOSIS — D689 Coagulation defect, unspecified: Secondary | ICD-10-CM | POA: Diagnosis not present

## 2020-07-11 DIAGNOSIS — N186 End stage renal disease: Secondary | ICD-10-CM | POA: Diagnosis not present

## 2020-07-11 DIAGNOSIS — D631 Anemia in chronic kidney disease: Secondary | ICD-10-CM | POA: Diagnosis not present

## 2020-07-11 DIAGNOSIS — Z992 Dependence on renal dialysis: Secondary | ICD-10-CM | POA: Diagnosis not present

## 2020-07-11 DIAGNOSIS — N2581 Secondary hyperparathyroidism of renal origin: Secondary | ICD-10-CM | POA: Diagnosis not present

## 2020-07-13 ENCOUNTER — Ambulatory Visit (INDEPENDENT_AMBULATORY_CARE_PROVIDER_SITE_OTHER): Payer: Medicare HMO

## 2020-07-13 DIAGNOSIS — I429 Cardiomyopathy, unspecified: Secondary | ICD-10-CM

## 2020-07-13 DIAGNOSIS — N2581 Secondary hyperparathyroidism of renal origin: Secondary | ICD-10-CM | POA: Diagnosis not present

## 2020-07-13 DIAGNOSIS — N186 End stage renal disease: Secondary | ICD-10-CM | POA: Diagnosis not present

## 2020-07-13 DIAGNOSIS — Z992 Dependence on renal dialysis: Secondary | ICD-10-CM | POA: Diagnosis not present

## 2020-07-13 DIAGNOSIS — D689 Coagulation defect, unspecified: Secondary | ICD-10-CM | POA: Diagnosis not present

## 2020-07-13 DIAGNOSIS — D631 Anemia in chronic kidney disease: Secondary | ICD-10-CM | POA: Diagnosis not present

## 2020-07-13 DIAGNOSIS — D509 Iron deficiency anemia, unspecified: Secondary | ICD-10-CM | POA: Diagnosis not present

## 2020-07-14 LAB — CUP PACEART REMOTE DEVICE CHECK
Battery Remaining Longevity: 3 mo — CL
Battery Remaining Percentage: 2 %
Brady Statistic RA Percent Paced: 0 %
Brady Statistic RV Percent Paced: 1 %
Date Time Interrogation Session: 20220128112500
HighPow Impedance: 55 Ohm
Implantable Lead Implant Date: 20091218
Implantable Lead Implant Date: 20091218
Implantable Lead Location: 753859
Implantable Lead Location: 753860
Implantable Lead Model: 158
Implantable Lead Serial Number: 131093
Implantable Pulse Generator Implant Date: 20091218
Lead Channel Impedance Value: 425 Ohm
Lead Channel Impedance Value: 539 Ohm
Lead Channel Pacing Threshold Amplitude: 0.6 V
Lead Channel Pacing Threshold Amplitude: 0.8 V
Lead Channel Pacing Threshold Pulse Width: 0.5 ms
Lead Channel Pacing Threshold Pulse Width: 0.5 ms
Lead Channel Setting Pacing Amplitude: 2 V
Lead Channel Setting Pacing Amplitude: 2.4 V
Lead Channel Setting Pacing Pulse Width: 0.5 ms
Lead Channel Setting Sensing Sensitivity: 0.6 mV
Pulse Gen Serial Number: 131093

## 2020-07-15 DIAGNOSIS — N186 End stage renal disease: Secondary | ICD-10-CM | POA: Diagnosis not present

## 2020-07-15 DIAGNOSIS — Z992 Dependence on renal dialysis: Secondary | ICD-10-CM | POA: Diagnosis not present

## 2020-07-15 DIAGNOSIS — N2581 Secondary hyperparathyroidism of renal origin: Secondary | ICD-10-CM | POA: Diagnosis not present

## 2020-07-15 DIAGNOSIS — D631 Anemia in chronic kidney disease: Secondary | ICD-10-CM | POA: Diagnosis not present

## 2020-07-15 DIAGNOSIS — D689 Coagulation defect, unspecified: Secondary | ICD-10-CM | POA: Diagnosis not present

## 2020-07-15 DIAGNOSIS — D509 Iron deficiency anemia, unspecified: Secondary | ICD-10-CM | POA: Diagnosis not present

## 2020-07-17 DIAGNOSIS — I129 Hypertensive chronic kidney disease with stage 1 through stage 4 chronic kidney disease, or unspecified chronic kidney disease: Secondary | ICD-10-CM | POA: Diagnosis not present

## 2020-07-17 DIAGNOSIS — N186 End stage renal disease: Secondary | ICD-10-CM | POA: Diagnosis not present

## 2020-07-17 DIAGNOSIS — Z992 Dependence on renal dialysis: Secondary | ICD-10-CM | POA: Diagnosis not present

## 2020-07-18 DIAGNOSIS — N2581 Secondary hyperparathyroidism of renal origin: Secondary | ICD-10-CM | POA: Diagnosis not present

## 2020-07-18 DIAGNOSIS — Z992 Dependence on renal dialysis: Secondary | ICD-10-CM | POA: Diagnosis not present

## 2020-07-18 DIAGNOSIS — D509 Iron deficiency anemia, unspecified: Secondary | ICD-10-CM | POA: Diagnosis not present

## 2020-07-18 DIAGNOSIS — D631 Anemia in chronic kidney disease: Secondary | ICD-10-CM | POA: Diagnosis not present

## 2020-07-18 DIAGNOSIS — E1122 Type 2 diabetes mellitus with diabetic chronic kidney disease: Secondary | ICD-10-CM | POA: Diagnosis not present

## 2020-07-18 DIAGNOSIS — D689 Coagulation defect, unspecified: Secondary | ICD-10-CM | POA: Diagnosis not present

## 2020-07-18 DIAGNOSIS — N186 End stage renal disease: Secondary | ICD-10-CM | POA: Diagnosis not present

## 2020-07-20 DIAGNOSIS — N186 End stage renal disease: Secondary | ICD-10-CM | POA: Diagnosis not present

## 2020-07-20 DIAGNOSIS — Z992 Dependence on renal dialysis: Secondary | ICD-10-CM | POA: Diagnosis not present

## 2020-07-20 DIAGNOSIS — D509 Iron deficiency anemia, unspecified: Secondary | ICD-10-CM | POA: Diagnosis not present

## 2020-07-20 DIAGNOSIS — D689 Coagulation defect, unspecified: Secondary | ICD-10-CM | POA: Diagnosis not present

## 2020-07-20 DIAGNOSIS — D631 Anemia in chronic kidney disease: Secondary | ICD-10-CM | POA: Diagnosis not present

## 2020-07-20 DIAGNOSIS — E1122 Type 2 diabetes mellitus with diabetic chronic kidney disease: Secondary | ICD-10-CM | POA: Diagnosis not present

## 2020-07-20 DIAGNOSIS — N2581 Secondary hyperparathyroidism of renal origin: Secondary | ICD-10-CM | POA: Diagnosis not present

## 2020-07-22 DIAGNOSIS — E1122 Type 2 diabetes mellitus with diabetic chronic kidney disease: Secondary | ICD-10-CM | POA: Diagnosis not present

## 2020-07-22 DIAGNOSIS — N186 End stage renal disease: Secondary | ICD-10-CM | POA: Diagnosis not present

## 2020-07-22 DIAGNOSIS — D631 Anemia in chronic kidney disease: Secondary | ICD-10-CM | POA: Diagnosis not present

## 2020-07-22 DIAGNOSIS — D689 Coagulation defect, unspecified: Secondary | ICD-10-CM | POA: Diagnosis not present

## 2020-07-22 DIAGNOSIS — Z992 Dependence on renal dialysis: Secondary | ICD-10-CM | POA: Diagnosis not present

## 2020-07-22 DIAGNOSIS — D509 Iron deficiency anemia, unspecified: Secondary | ICD-10-CM | POA: Diagnosis not present

## 2020-07-22 DIAGNOSIS — N2581 Secondary hyperparathyroidism of renal origin: Secondary | ICD-10-CM | POA: Diagnosis not present

## 2020-07-24 NOTE — Addendum Note (Signed)
Addended by: Cheri Kearns A on: 07/24/2020 10:41 AM   Modules accepted: Level of Service

## 2020-07-24 NOTE — Progress Notes (Signed)
Remote ICD transmission.   

## 2020-07-25 DIAGNOSIS — N2581 Secondary hyperparathyroidism of renal origin: Secondary | ICD-10-CM | POA: Diagnosis not present

## 2020-07-25 DIAGNOSIS — Z992 Dependence on renal dialysis: Secondary | ICD-10-CM | POA: Diagnosis not present

## 2020-07-25 DIAGNOSIS — D509 Iron deficiency anemia, unspecified: Secondary | ICD-10-CM | POA: Diagnosis not present

## 2020-07-25 DIAGNOSIS — D631 Anemia in chronic kidney disease: Secondary | ICD-10-CM | POA: Diagnosis not present

## 2020-07-25 DIAGNOSIS — D689 Coagulation defect, unspecified: Secondary | ICD-10-CM | POA: Diagnosis not present

## 2020-07-25 DIAGNOSIS — E1122 Type 2 diabetes mellitus with diabetic chronic kidney disease: Secondary | ICD-10-CM | POA: Diagnosis not present

## 2020-07-25 DIAGNOSIS — N186 End stage renal disease: Secondary | ICD-10-CM | POA: Diagnosis not present

## 2020-07-26 ENCOUNTER — Other Ambulatory Visit: Payer: Self-pay | Admitting: Family Medicine

## 2020-07-27 DIAGNOSIS — N2581 Secondary hyperparathyroidism of renal origin: Secondary | ICD-10-CM | POA: Diagnosis not present

## 2020-07-27 DIAGNOSIS — D689 Coagulation defect, unspecified: Secondary | ICD-10-CM | POA: Diagnosis not present

## 2020-07-27 DIAGNOSIS — D509 Iron deficiency anemia, unspecified: Secondary | ICD-10-CM | POA: Diagnosis not present

## 2020-07-27 DIAGNOSIS — Z992 Dependence on renal dialysis: Secondary | ICD-10-CM | POA: Diagnosis not present

## 2020-07-27 DIAGNOSIS — E1122 Type 2 diabetes mellitus with diabetic chronic kidney disease: Secondary | ICD-10-CM | POA: Diagnosis not present

## 2020-07-27 DIAGNOSIS — N186 End stage renal disease: Secondary | ICD-10-CM | POA: Diagnosis not present

## 2020-07-27 DIAGNOSIS — D631 Anemia in chronic kidney disease: Secondary | ICD-10-CM | POA: Diagnosis not present

## 2020-07-27 NOTE — Telephone Encounter (Signed)
allopurinol (ZYLOPRIM) 100 MG tablet   Disp:  30 tablet    Refills:  1 Last ov- 04/26/20 Last refill- 07/02/20   sertraline (ZOLOFT) 100 MG tablet   Disp:  30 tablet    Refills:  2  Last ov- 04/26/20 Last refill- 07/02/20

## 2020-07-29 DIAGNOSIS — D509 Iron deficiency anemia, unspecified: Secondary | ICD-10-CM | POA: Diagnosis not present

## 2020-07-29 DIAGNOSIS — N186 End stage renal disease: Secondary | ICD-10-CM | POA: Diagnosis not present

## 2020-07-29 DIAGNOSIS — D689 Coagulation defect, unspecified: Secondary | ICD-10-CM | POA: Diagnosis not present

## 2020-07-29 DIAGNOSIS — Z992 Dependence on renal dialysis: Secondary | ICD-10-CM | POA: Diagnosis not present

## 2020-07-29 DIAGNOSIS — D631 Anemia in chronic kidney disease: Secondary | ICD-10-CM | POA: Diagnosis not present

## 2020-07-29 DIAGNOSIS — N2581 Secondary hyperparathyroidism of renal origin: Secondary | ICD-10-CM | POA: Diagnosis not present

## 2020-07-29 DIAGNOSIS — E1122 Type 2 diabetes mellitus with diabetic chronic kidney disease: Secondary | ICD-10-CM | POA: Diagnosis not present

## 2020-08-01 DIAGNOSIS — D631 Anemia in chronic kidney disease: Secondary | ICD-10-CM | POA: Diagnosis not present

## 2020-08-01 DIAGNOSIS — D689 Coagulation defect, unspecified: Secondary | ICD-10-CM | POA: Diagnosis not present

## 2020-08-01 DIAGNOSIS — Z992 Dependence on renal dialysis: Secondary | ICD-10-CM | POA: Diagnosis not present

## 2020-08-01 DIAGNOSIS — N2581 Secondary hyperparathyroidism of renal origin: Secondary | ICD-10-CM | POA: Diagnosis not present

## 2020-08-01 DIAGNOSIS — D509 Iron deficiency anemia, unspecified: Secondary | ICD-10-CM | POA: Diagnosis not present

## 2020-08-01 DIAGNOSIS — N186 End stage renal disease: Secondary | ICD-10-CM | POA: Diagnosis not present

## 2020-08-01 DIAGNOSIS — E1122 Type 2 diabetes mellitus with diabetic chronic kidney disease: Secondary | ICD-10-CM | POA: Diagnosis not present

## 2020-08-03 DIAGNOSIS — D631 Anemia in chronic kidney disease: Secondary | ICD-10-CM | POA: Diagnosis not present

## 2020-08-03 DIAGNOSIS — Z992 Dependence on renal dialysis: Secondary | ICD-10-CM | POA: Diagnosis not present

## 2020-08-03 DIAGNOSIS — N2581 Secondary hyperparathyroidism of renal origin: Secondary | ICD-10-CM | POA: Diagnosis not present

## 2020-08-03 DIAGNOSIS — E1122 Type 2 diabetes mellitus with diabetic chronic kidney disease: Secondary | ICD-10-CM | POA: Diagnosis not present

## 2020-08-03 DIAGNOSIS — N186 End stage renal disease: Secondary | ICD-10-CM | POA: Diagnosis not present

## 2020-08-03 DIAGNOSIS — D509 Iron deficiency anemia, unspecified: Secondary | ICD-10-CM | POA: Diagnosis not present

## 2020-08-03 DIAGNOSIS — D689 Coagulation defect, unspecified: Secondary | ICD-10-CM | POA: Diagnosis not present

## 2020-08-05 DIAGNOSIS — D689 Coagulation defect, unspecified: Secondary | ICD-10-CM | POA: Diagnosis not present

## 2020-08-05 DIAGNOSIS — E1122 Type 2 diabetes mellitus with diabetic chronic kidney disease: Secondary | ICD-10-CM | POA: Diagnosis not present

## 2020-08-05 DIAGNOSIS — N186 End stage renal disease: Secondary | ICD-10-CM | POA: Diagnosis not present

## 2020-08-05 DIAGNOSIS — Z992 Dependence on renal dialysis: Secondary | ICD-10-CM | POA: Diagnosis not present

## 2020-08-05 DIAGNOSIS — N2581 Secondary hyperparathyroidism of renal origin: Secondary | ICD-10-CM | POA: Diagnosis not present

## 2020-08-05 DIAGNOSIS — D509 Iron deficiency anemia, unspecified: Secondary | ICD-10-CM | POA: Diagnosis not present

## 2020-08-05 DIAGNOSIS — D631 Anemia in chronic kidney disease: Secondary | ICD-10-CM | POA: Diagnosis not present

## 2020-08-08 DIAGNOSIS — Z992 Dependence on renal dialysis: Secondary | ICD-10-CM | POA: Diagnosis not present

## 2020-08-08 DIAGNOSIS — N2581 Secondary hyperparathyroidism of renal origin: Secondary | ICD-10-CM | POA: Diagnosis not present

## 2020-08-08 DIAGNOSIS — N186 End stage renal disease: Secondary | ICD-10-CM | POA: Diagnosis not present

## 2020-08-08 DIAGNOSIS — D689 Coagulation defect, unspecified: Secondary | ICD-10-CM | POA: Diagnosis not present

## 2020-08-08 DIAGNOSIS — D631 Anemia in chronic kidney disease: Secondary | ICD-10-CM | POA: Diagnosis not present

## 2020-08-08 DIAGNOSIS — D509 Iron deficiency anemia, unspecified: Secondary | ICD-10-CM | POA: Diagnosis not present

## 2020-08-08 DIAGNOSIS — E1122 Type 2 diabetes mellitus with diabetic chronic kidney disease: Secondary | ICD-10-CM | POA: Diagnosis not present

## 2020-08-10 DIAGNOSIS — D509 Iron deficiency anemia, unspecified: Secondary | ICD-10-CM | POA: Diagnosis not present

## 2020-08-10 DIAGNOSIS — D689 Coagulation defect, unspecified: Secondary | ICD-10-CM | POA: Diagnosis not present

## 2020-08-10 DIAGNOSIS — N186 End stage renal disease: Secondary | ICD-10-CM | POA: Diagnosis not present

## 2020-08-10 DIAGNOSIS — E1122 Type 2 diabetes mellitus with diabetic chronic kidney disease: Secondary | ICD-10-CM | POA: Diagnosis not present

## 2020-08-10 DIAGNOSIS — Z992 Dependence on renal dialysis: Secondary | ICD-10-CM | POA: Diagnosis not present

## 2020-08-10 DIAGNOSIS — D631 Anemia in chronic kidney disease: Secondary | ICD-10-CM | POA: Diagnosis not present

## 2020-08-10 DIAGNOSIS — N2581 Secondary hyperparathyroidism of renal origin: Secondary | ICD-10-CM | POA: Diagnosis not present

## 2020-08-14 ENCOUNTER — Ambulatory Visit (INDEPENDENT_AMBULATORY_CARE_PROVIDER_SITE_OTHER): Payer: Medicare HMO

## 2020-08-14 DIAGNOSIS — D631 Anemia in chronic kidney disease: Secondary | ICD-10-CM | POA: Diagnosis not present

## 2020-08-14 DIAGNOSIS — E1122 Type 2 diabetes mellitus with diabetic chronic kidney disease: Secondary | ICD-10-CM | POA: Diagnosis not present

## 2020-08-14 DIAGNOSIS — I5022 Chronic systolic (congestive) heart failure: Secondary | ICD-10-CM

## 2020-08-14 DIAGNOSIS — I129 Hypertensive chronic kidney disease with stage 1 through stage 4 chronic kidney disease, or unspecified chronic kidney disease: Secondary | ICD-10-CM | POA: Diagnosis not present

## 2020-08-14 DIAGNOSIS — D509 Iron deficiency anemia, unspecified: Secondary | ICD-10-CM | POA: Diagnosis not present

## 2020-08-14 DIAGNOSIS — N186 End stage renal disease: Secondary | ICD-10-CM | POA: Diagnosis not present

## 2020-08-14 DIAGNOSIS — N2581 Secondary hyperparathyroidism of renal origin: Secondary | ICD-10-CM | POA: Diagnosis not present

## 2020-08-14 DIAGNOSIS — Z992 Dependence on renal dialysis: Secondary | ICD-10-CM | POA: Diagnosis not present

## 2020-08-14 DIAGNOSIS — I429 Cardiomyopathy, unspecified: Secondary | ICD-10-CM

## 2020-08-14 DIAGNOSIS — D689 Coagulation defect, unspecified: Secondary | ICD-10-CM | POA: Diagnosis not present

## 2020-08-15 DIAGNOSIS — Z992 Dependence on renal dialysis: Secondary | ICD-10-CM | POA: Diagnosis not present

## 2020-08-15 DIAGNOSIS — N2581 Secondary hyperparathyroidism of renal origin: Secondary | ICD-10-CM | POA: Diagnosis not present

## 2020-08-15 DIAGNOSIS — D689 Coagulation defect, unspecified: Secondary | ICD-10-CM | POA: Diagnosis not present

## 2020-08-15 DIAGNOSIS — N186 End stage renal disease: Secondary | ICD-10-CM | POA: Diagnosis not present

## 2020-08-16 DIAGNOSIS — D689 Coagulation defect, unspecified: Secondary | ICD-10-CM | POA: Diagnosis not present

## 2020-08-16 DIAGNOSIS — N186 End stage renal disease: Secondary | ICD-10-CM | POA: Diagnosis not present

## 2020-08-16 DIAGNOSIS — Z992 Dependence on renal dialysis: Secondary | ICD-10-CM | POA: Diagnosis not present

## 2020-08-16 DIAGNOSIS — N2581 Secondary hyperparathyroidism of renal origin: Secondary | ICD-10-CM | POA: Diagnosis not present

## 2020-08-16 LAB — CUP PACEART REMOTE DEVICE CHECK
Battery Remaining Longevity: 3 mo — CL
Battery Remaining Percentage: 1 %
Brady Statistic RA Percent Paced: 0 %
Brady Statistic RV Percent Paced: 1 %
Date Time Interrogation Session: 20220228030600
HighPow Impedance: 55 Ohm
Implantable Lead Implant Date: 20091218
Implantable Lead Implant Date: 20091218
Implantable Lead Location: 753859
Implantable Lead Location: 753860
Implantable Lead Model: 158
Implantable Lead Serial Number: 131093
Implantable Pulse Generator Implant Date: 20091218
Lead Channel Impedance Value: 436 Ohm
Lead Channel Impedance Value: 565 Ohm
Lead Channel Pacing Threshold Amplitude: 0.6 V
Lead Channel Pacing Threshold Amplitude: 0.8 V
Lead Channel Pacing Threshold Pulse Width: 0.5 ms
Lead Channel Pacing Threshold Pulse Width: 0.5 ms
Lead Channel Setting Pacing Amplitude: 2 V
Lead Channel Setting Pacing Amplitude: 2.4 V
Lead Channel Setting Pacing Pulse Width: 0.5 ms
Lead Channel Setting Sensing Sensitivity: 0.6 mV
Pulse Gen Serial Number: 131093

## 2020-08-17 DIAGNOSIS — N2581 Secondary hyperparathyroidism of renal origin: Secondary | ICD-10-CM | POA: Diagnosis not present

## 2020-08-17 DIAGNOSIS — D689 Coagulation defect, unspecified: Secondary | ICD-10-CM | POA: Diagnosis not present

## 2020-08-17 DIAGNOSIS — Z992 Dependence on renal dialysis: Secondary | ICD-10-CM | POA: Diagnosis not present

## 2020-08-17 DIAGNOSIS — N186 End stage renal disease: Secondary | ICD-10-CM | POA: Diagnosis not present

## 2020-08-19 DIAGNOSIS — N186 End stage renal disease: Secondary | ICD-10-CM | POA: Diagnosis not present

## 2020-08-19 DIAGNOSIS — N2581 Secondary hyperparathyroidism of renal origin: Secondary | ICD-10-CM | POA: Diagnosis not present

## 2020-08-19 DIAGNOSIS — D689 Coagulation defect, unspecified: Secondary | ICD-10-CM | POA: Diagnosis not present

## 2020-08-19 DIAGNOSIS — Z992 Dependence on renal dialysis: Secondary | ICD-10-CM | POA: Diagnosis not present

## 2020-08-21 NOTE — Addendum Note (Signed)
Addended by: Douglass Rivers D on: 08/21/2020 01:23 PM   Modules accepted: Level of Service

## 2020-08-21 NOTE — Progress Notes (Signed)
Remote ICD transmission.   

## 2020-08-22 DIAGNOSIS — D689 Coagulation defect, unspecified: Secondary | ICD-10-CM | POA: Diagnosis not present

## 2020-08-22 DIAGNOSIS — N2581 Secondary hyperparathyroidism of renal origin: Secondary | ICD-10-CM | POA: Diagnosis not present

## 2020-08-22 DIAGNOSIS — E1122 Type 2 diabetes mellitus with diabetic chronic kidney disease: Secondary | ICD-10-CM | POA: Diagnosis not present

## 2020-08-22 DIAGNOSIS — N186 End stage renal disease: Secondary | ICD-10-CM | POA: Diagnosis not present

## 2020-08-22 DIAGNOSIS — Z992 Dependence on renal dialysis: Secondary | ICD-10-CM | POA: Diagnosis not present

## 2020-08-24 DIAGNOSIS — N2581 Secondary hyperparathyroidism of renal origin: Secondary | ICD-10-CM | POA: Diagnosis not present

## 2020-08-24 DIAGNOSIS — E1122 Type 2 diabetes mellitus with diabetic chronic kidney disease: Secondary | ICD-10-CM | POA: Diagnosis not present

## 2020-08-24 DIAGNOSIS — D689 Coagulation defect, unspecified: Secondary | ICD-10-CM | POA: Diagnosis not present

## 2020-08-24 DIAGNOSIS — Z992 Dependence on renal dialysis: Secondary | ICD-10-CM | POA: Diagnosis not present

## 2020-08-24 DIAGNOSIS — N186 End stage renal disease: Secondary | ICD-10-CM | POA: Diagnosis not present

## 2020-08-26 DIAGNOSIS — Z992 Dependence on renal dialysis: Secondary | ICD-10-CM | POA: Diagnosis not present

## 2020-08-26 DIAGNOSIS — N2581 Secondary hyperparathyroidism of renal origin: Secondary | ICD-10-CM | POA: Diagnosis not present

## 2020-08-26 DIAGNOSIS — D689 Coagulation defect, unspecified: Secondary | ICD-10-CM | POA: Diagnosis not present

## 2020-08-26 DIAGNOSIS — N186 End stage renal disease: Secondary | ICD-10-CM | POA: Diagnosis not present

## 2020-08-26 DIAGNOSIS — E1122 Type 2 diabetes mellitus with diabetic chronic kidney disease: Secondary | ICD-10-CM | POA: Diagnosis not present

## 2020-08-29 DIAGNOSIS — Z992 Dependence on renal dialysis: Secondary | ICD-10-CM | POA: Diagnosis not present

## 2020-08-29 DIAGNOSIS — D689 Coagulation defect, unspecified: Secondary | ICD-10-CM | POA: Diagnosis not present

## 2020-08-29 DIAGNOSIS — N2581 Secondary hyperparathyroidism of renal origin: Secondary | ICD-10-CM | POA: Diagnosis not present

## 2020-08-29 DIAGNOSIS — N186 End stage renal disease: Secondary | ICD-10-CM | POA: Diagnosis not present

## 2020-08-31 ENCOUNTER — Other Ambulatory Visit: Payer: Self-pay | Admitting: Family Medicine

## 2020-08-31 DIAGNOSIS — D689 Coagulation defect, unspecified: Secondary | ICD-10-CM | POA: Diagnosis not present

## 2020-08-31 DIAGNOSIS — N186 End stage renal disease: Secondary | ICD-10-CM | POA: Diagnosis not present

## 2020-08-31 DIAGNOSIS — Z992 Dependence on renal dialysis: Secondary | ICD-10-CM | POA: Diagnosis not present

## 2020-08-31 DIAGNOSIS — N2581 Secondary hyperparathyroidism of renal origin: Secondary | ICD-10-CM | POA: Diagnosis not present

## 2020-09-02 DIAGNOSIS — D689 Coagulation defect, unspecified: Secondary | ICD-10-CM | POA: Diagnosis not present

## 2020-09-02 DIAGNOSIS — Z992 Dependence on renal dialysis: Secondary | ICD-10-CM | POA: Diagnosis not present

## 2020-09-02 DIAGNOSIS — N2581 Secondary hyperparathyroidism of renal origin: Secondary | ICD-10-CM | POA: Diagnosis not present

## 2020-09-02 DIAGNOSIS — N186 End stage renal disease: Secondary | ICD-10-CM | POA: Diagnosis not present

## 2020-09-04 DIAGNOSIS — E039 Hypothyroidism, unspecified: Secondary | ICD-10-CM | POA: Diagnosis not present

## 2020-09-04 DIAGNOSIS — E785 Hyperlipidemia, unspecified: Secondary | ICD-10-CM | POA: Diagnosis not present

## 2020-09-04 DIAGNOSIS — Z8249 Family history of ischemic heart disease and other diseases of the circulatory system: Secondary | ICD-10-CM | POA: Diagnosis not present

## 2020-09-04 DIAGNOSIS — Z87891 Personal history of nicotine dependence: Secondary | ICD-10-CM | POA: Diagnosis not present

## 2020-09-04 DIAGNOSIS — R69 Illness, unspecified: Secondary | ICD-10-CM | POA: Diagnosis not present

## 2020-09-04 DIAGNOSIS — M109 Gout, unspecified: Secondary | ICD-10-CM | POA: Diagnosis not present

## 2020-09-04 DIAGNOSIS — Z95810 Presence of automatic (implantable) cardiac defibrillator: Secondary | ICD-10-CM | POA: Diagnosis not present

## 2020-09-04 DIAGNOSIS — Z833 Family history of diabetes mellitus: Secondary | ICD-10-CM | POA: Diagnosis not present

## 2020-09-05 DIAGNOSIS — Z992 Dependence on renal dialysis: Secondary | ICD-10-CM | POA: Diagnosis not present

## 2020-09-05 DIAGNOSIS — N186 End stage renal disease: Secondary | ICD-10-CM | POA: Diagnosis not present

## 2020-09-05 DIAGNOSIS — N2581 Secondary hyperparathyroidism of renal origin: Secondary | ICD-10-CM | POA: Diagnosis not present

## 2020-09-05 DIAGNOSIS — D689 Coagulation defect, unspecified: Secondary | ICD-10-CM | POA: Diagnosis not present

## 2020-09-05 DIAGNOSIS — D631 Anemia in chronic kidney disease: Secondary | ICD-10-CM | POA: Diagnosis not present

## 2020-09-07 DIAGNOSIS — N186 End stage renal disease: Secondary | ICD-10-CM | POA: Diagnosis not present

## 2020-09-07 DIAGNOSIS — D689 Coagulation defect, unspecified: Secondary | ICD-10-CM | POA: Diagnosis not present

## 2020-09-07 DIAGNOSIS — N2581 Secondary hyperparathyroidism of renal origin: Secondary | ICD-10-CM | POA: Diagnosis not present

## 2020-09-07 DIAGNOSIS — Z992 Dependence on renal dialysis: Secondary | ICD-10-CM | POA: Diagnosis not present

## 2020-09-07 DIAGNOSIS — D631 Anemia in chronic kidney disease: Secondary | ICD-10-CM | POA: Diagnosis not present

## 2020-09-09 DIAGNOSIS — D689 Coagulation defect, unspecified: Secondary | ICD-10-CM | POA: Diagnosis not present

## 2020-09-09 DIAGNOSIS — Z992 Dependence on renal dialysis: Secondary | ICD-10-CM | POA: Diagnosis not present

## 2020-09-09 DIAGNOSIS — N2581 Secondary hyperparathyroidism of renal origin: Secondary | ICD-10-CM | POA: Diagnosis not present

## 2020-09-09 DIAGNOSIS — N186 End stage renal disease: Secondary | ICD-10-CM | POA: Diagnosis not present

## 2020-09-09 DIAGNOSIS — D631 Anemia in chronic kidney disease: Secondary | ICD-10-CM | POA: Diagnosis not present

## 2020-09-11 ENCOUNTER — Ambulatory Visit (INDEPENDENT_AMBULATORY_CARE_PROVIDER_SITE_OTHER): Payer: Medicare HMO

## 2020-09-11 DIAGNOSIS — I429 Cardiomyopathy, unspecified: Secondary | ICD-10-CM

## 2020-09-12 ENCOUNTER — Telehealth: Payer: Self-pay

## 2020-09-12 DIAGNOSIS — D689 Coagulation defect, unspecified: Secondary | ICD-10-CM | POA: Diagnosis not present

## 2020-09-12 DIAGNOSIS — N2581 Secondary hyperparathyroidism of renal origin: Secondary | ICD-10-CM | POA: Diagnosis not present

## 2020-09-12 DIAGNOSIS — Z992 Dependence on renal dialysis: Secondary | ICD-10-CM | POA: Diagnosis not present

## 2020-09-12 DIAGNOSIS — N186 End stage renal disease: Secondary | ICD-10-CM | POA: Diagnosis not present

## 2020-09-12 LAB — CUP PACEART REMOTE DEVICE CHECK
Brady Statistic RA Percent Paced: 0 %
Brady Statistic RV Percent Paced: 1 %
Date Time Interrogation Session: 20220329023100
HighPow Impedance: 51 Ohm
Implantable Lead Implant Date: 20091218
Implantable Lead Implant Date: 20091218
Implantable Lead Location: 753859
Implantable Lead Location: 753860
Implantable Lead Model: 158
Implantable Lead Serial Number: 131093
Implantable Pulse Generator Implant Date: 20091218
Lead Channel Impedance Value: 415 Ohm
Lead Channel Impedance Value: 580 Ohm
Lead Channel Pacing Threshold Amplitude: 0.6 V
Lead Channel Pacing Threshold Amplitude: 0.8 V
Lead Channel Pacing Threshold Pulse Width: 0.5 ms
Lead Channel Pacing Threshold Pulse Width: 0.5 ms
Lead Channel Setting Pacing Amplitude: 2 V
Lead Channel Setting Pacing Amplitude: 2.4 V
Lead Channel Setting Pacing Pulse Width: 0.5 ms
Lead Channel Setting Sensing Sensitivity: 0.6 mV
Pulse Gen Serial Number: 131093

## 2020-09-12 NOTE — Telephone Encounter (Signed)
Scheduled ICD  transmission shows battery reached RRT on 09/11/20.    Pt last seen in office 09/2019 by RU, PA.  Pt will need OV with Dr. Caryl Comes to determine plan for replacement.   Attempted to reach pt, no answer.  LVM for pt son, Tyler Hahn (DPR on file).  Advised that scheduling will be contacting to set up appt within next 3 months.

## 2020-09-14 DIAGNOSIS — N186 End stage renal disease: Secondary | ICD-10-CM | POA: Diagnosis not present

## 2020-09-14 DIAGNOSIS — I129 Hypertensive chronic kidney disease with stage 1 through stage 4 chronic kidney disease, or unspecified chronic kidney disease: Secondary | ICD-10-CM | POA: Diagnosis not present

## 2020-09-14 DIAGNOSIS — N2581 Secondary hyperparathyroidism of renal origin: Secondary | ICD-10-CM | POA: Diagnosis not present

## 2020-09-14 DIAGNOSIS — Z992 Dependence on renal dialysis: Secondary | ICD-10-CM | POA: Diagnosis not present

## 2020-09-14 DIAGNOSIS — D689 Coagulation defect, unspecified: Secondary | ICD-10-CM | POA: Diagnosis not present

## 2020-09-16 DIAGNOSIS — N2581 Secondary hyperparathyroidism of renal origin: Secondary | ICD-10-CM | POA: Diagnosis not present

## 2020-09-16 DIAGNOSIS — Z992 Dependence on renal dialysis: Secondary | ICD-10-CM | POA: Diagnosis not present

## 2020-09-16 DIAGNOSIS — N186 End stage renal disease: Secondary | ICD-10-CM | POA: Diagnosis not present

## 2020-09-16 DIAGNOSIS — D689 Coagulation defect, unspecified: Secondary | ICD-10-CM | POA: Diagnosis not present

## 2020-09-18 NOTE — Progress Notes (Signed)
Cardiology Office Note Date:  09/18/2020  Patient ID:  Tyler Hahn, Tyler Hahn [birth year], MRN [id number] PCP:  [name], MD  Electrophysiologst:  Dr. Caryl Comes Nephrology: Dr. Joelyn Oms   Chief Complaint:   device RRT  History of Present Illness: Tyler Hahn is a 80 y.o. male with history of NICM w/ICD, HTN, hyperthyroidism (follows with endo), HLD, ESRFon HD  He comes today to be seen for Dr. Caryl Comes, last seen by him in April 2018, at that visit, doing well, no changes were made to his tx.  I saw him Jun 2019, he was doing well.   Was not very physically active, spendinf most of his time visiting with his wife at the SNF, where most of the time he is seated, occasionally will get some walks in, though reports takes care of his home and denies any exertional intolerances with his ADLs.  He denied any CP, palpitations, no rest SOB, no DOE, no symptoms of PND or orthopnea.  No dizziness, near syncope or syncope.  He was seeing his nephrologist about every 3 months, saw him the week prior.  He had trace edema, they discussed it, made no changes other then the addition of a vitamin.  He was also seeing his PMD about Q 3-4 mo as well.  Planned for annual EP/device visits. This was his last visit with [place], likely 2/2 COVID, + remotes.  He had a few hospital visits with AKI, and has subsequently been started on HD.   08/23/2019  RUE AV graft placed 09/11/2019 was in [facility] ER 2/2 weakness, tremulousness while at dialysis, mentioned borderline low BP . He had no LOC, he was not felt to have had a seizure, was nonfocal and feeling better in the ER.  Recently treated for UTI.  Was mildly hypokalemic, rhythm . Discharged from the ER without particular intervention.  DEVICE clinic however noted true VT/ VF episodes failed ATP and shocked  Dr. Caryl Comes was made aware, pt's K+ was 2.9, (mag Dr. Caryl Comes discussed with nephrology with plans to change his dialysate to K 3.0 Noted the patient with recent  Pseudomonas aeruginosa bacteremia (08/31/19) and asked that repeat [facility] be draw at the time of our appt (f.u 09/03/19 x2 were neg x5 days)   I saw him April 2021 He comes today accompanied by his son who he lives with and helps take care of his medicines/care.  Since his ER visit he is not taking the coreg 2/2 low BPs.  Since stopping the coreg low BP have no longer been an issue, nurses have not mentioned any further. The patient is doing well.  New to HD now about a month or so.  Tolerating so far except his BP was low.  He denies any CP, has some tenderness by his site.  No CP otherwise.  No recurrent weak spells, no further syncope. No palpitations, no SOB.  He sleeps well, no symptoms of PND or orthopnea. LE swelling remains better since on HD. No changes were made, planned for 6 mo f/u BC were both neg for 5 days   TODAY He is accompanied by his son. He is doing well, tolerating dialysis without BP issues, (off meds) Denies any CP, palpitations or cardiac awareness No SOB, DOE No dizzy spells, near syncope or syncope.   Device information: BSCi dual chamber ICD, implanted  06/03/08 + appropriate  Therapies March 2021 in environment of marked hypokalemia   Past Medical History:  Diagnosis Date  . Anxiety   .  Automatic implantable cardioverter-defibrillator in situ    greg taylor  . CHF (congestive heart failure) ([place])    2000  . Diabetes mellitus    no meds  . Fatty liver   . Gout    "bout 2-3 months ago"-meds helped.  . Hyperlipidemia   . Hypertension   . Hyperthyroidism   . Kidney cysts   . PONV (postoperative nausea and vomiting)   . Renal insufficiency     Past Surgical History:  Procedure Laterality Date  . AV FISTULA PLACEMENT Right 08/23/2019   Procedure: ARTERIOVENOUS GORTEX GRAFT RIGHT ARM;  Surgeon: Rosetta Posner, MD;  Location: [place];  Service: Vascular;  Laterality: Right;  . CARDIAC DEFIBRILLATOR PLACEMENT    . COLONOSCOPY WITH PROPOFOL N/A 10/03/2015    Procedure: COLONOSCOPY WITH PROPOFOL;  Surgeon: Ladene Artist, MD;  [facility];  Service: Endoscopy;  Laterality: N/A;  . DIAGNOSTIC LAPAROSCOPY  01/27/2014   Dr Brantley Stage  . ENTEROSCOPY N/A 11/25/2013   Procedure: ENTEROSCOPY;  Surgeon: Ladene Artist, MD;  [facility];  Service: Endoscopy;  Laterality: N/A;  . ICD,[place] [employer]    . LAPAROSCOPY N/A 01/27/2014   Procedure: LAPAROSCOPY DIAGNOSTIC;  Surgeon: Joyice Faster. Cornett, MD;  Location: [place];  Service: General;  Laterality: N/A;  . polyp removal throat     difficulty speaking    Current Outpatient Medications  Medication Sig Dispense Refill  . allopurinol (ZYLOPRIM) 100 MG tablet TAKE 1 TABLET BY MOUTH EVERY DAY 30 tablet 0  . atorvastatin (LIPITOR) 20 MG tablet TAKE 1 TABLET BY MOUTH EVERY DAY 30 tablet 5  . finasteride (PROSCAR) 5 MG tablet Take 1 tablet (5 mg total) by mouth daily. 30 tablet 0  . levothyroxine (SYNTHROID) 25 MCG tablet TAKE 1 TABLET BY MOUTH EVERY DAY BEFORE BREAKFAST 30 tablet 5  . multivitamin (RENA-VIT) TABS tablet Take 1 tablet by mouth at bedtime. 30 tablet 0  . Nutritional Supplements (FEEDING SUPPLEMENT, NEPRO CARB STEADY,) LIQD Take 237 mLs by mouth 2 (two) times daily between meals. 14220 mL 0  . sertraline (ZOLOFT) 100 MG tablet TAKE 1 TABLET BY MOUTH EVERY DAY 30 tablet 2   No current facility-administered medications for this visit.    Allergies:   Sulfa antibiotics and Sulfonamide derivatives   Social History:  The patient  reports that he quit smoking about 21 years ago. His smoking use included cigarettes. He has never used smokeless tobacco. He reports that he does not drink alcohol and does not use drugs.   Family History:  The patient's family history includes Alzheimer's disease in his mother; Diabetes in his father; Heart disease in his father; Kidney disease in his father; Liver disease in his father; Stomach cancer in his mother.  ROS:  Please see the history of  present illness.  All other systems are reviewed and otherwise negative.   PHYSICAL EXAM:  VS:  There were no vitals taken for this visit. BMI: There is no height or weight on file to calculate BMI. Well nourished, well developed, in no acute distress  HEENT: normocephalic, atraumatic  Neck: no JVD, carotid bruits or masses Cardiac:  RRR; no significant murmurs, no rubs, or gallops Lungs:  CTA b/l, no wheezing, rhonchi or rales  Abd: soft, nontender MS: no deformity,  + age appropriate atrophy Ext:  no edema  Skin: warm and dry, no rash Neuro:  No gross deficits appreciated Psych: euthymic mood, full affect  ICD site is stable, no tethering or  discomfort.     EKG:  Done today and reviewed by myself SR, 1st degree AVB, IVCD One A paced beat PVC No significant changes  ICD interrogation done today and reviewed by myself:  Batter reached RRT 09/11/20 Lead measurements are stable NSVT noted   09/17/12: TTE Study Conclusion - Left ventricle: The cavity size was mildly dilated. Wall thickness was normal. Systolic function was severely reduced. The estimated ejection fraction was in the range of 20% to 25%. Diffuse hypokinesis. Doppler parameters are consistent with abnormal left ventricular relaxation (grade 1 diastolic dysfunction). - Aortic valve: There was no stenosis. - Mitral valve: Mildly calcified annulus. Trivial regurgitation. - Left atrium: The atrium was mildly dilated. - Right ventricle: The cavity size was normal. Pacer wire or catheter noted in right ventricle. Systolic function was mildly reduced. - Pulmonary arteries: No complete TR doppler jet so unable to estimate PA systolic pressure. - Inferior vena cava: The vessel was normal in size; the respirophasic diameter changes were in the normal range (= 50%); findings are consistent with normal central venous pressure. Impressions: - Mildly dilated left ventricle with severe  global hypokinesis, EF 20-25%. Normal RV size with mildly decreased systolic function. No significant valvular abnormalities.  Recent Labs: No results found for requested labs within last 8760 hours.  No results found for requested labs within last 8760 hours.   CrCl cannot be calculated (Patient's most recent lab result is older than the maximum 21 days allowed.).   Wt Readings from Last 3 Encounters:  05/17/20 158 lb 11.2 oz (72 kg)  04/26/20 158 lb 3.2 oz (71.8 kg)  04/25/20 165 lb (74.8 kg)     Other studies reviewed: Additional studies/records reviewed today include: summarized above  ASSESSMENT AND PLAN:  1. ICD     RRT on 08/2820  Discussed check ing an echo perhaps prior to generator replacement He has had appropriate therapies in setting of marked hypokalemia He and his so report that even if his EF was improved he would want the ICD replaced. We dicussed the procedure and potential risks/benefits. Particularly discussed increased infection risk given he is now on dialysis He is agreeable to proceed   2. NICM     volume managed with HD       3. HTN     Relative hypotension developed with HD     Off his coreg       4. VT/VF     No further therapies      Disposition: routine post procedure wound check and follow up    Current medicines are reviewed at length with the patient today.  The patient did not have any concerns regarding medicines.  Venetia Night, PA-C 09/18/2020 8:51 PM     [address] [facility] [place] [id number] [phone] (office)  [phone] (fax)

## 2020-09-19 DIAGNOSIS — D689 Coagulation defect, unspecified: Secondary | ICD-10-CM | POA: Diagnosis not present

## 2020-09-19 DIAGNOSIS — N186 End stage renal disease: Secondary | ICD-10-CM | POA: Diagnosis not present

## 2020-09-19 DIAGNOSIS — N2581 Secondary hyperparathyroidism of renal origin: Secondary | ICD-10-CM | POA: Diagnosis not present

## 2020-09-19 DIAGNOSIS — Z992 Dependence on renal dialysis: Secondary | ICD-10-CM | POA: Diagnosis not present

## 2020-09-19 DIAGNOSIS — D631 Anemia in chronic kidney disease: Secondary | ICD-10-CM | POA: Diagnosis not present

## 2020-09-20 ENCOUNTER — Encounter: Payer: Self-pay | Admitting: Physician Assistant

## 2020-09-20 ENCOUNTER — Other Ambulatory Visit: Payer: Self-pay

## 2020-09-20 ENCOUNTER — Ambulatory Visit: Payer: Medicare HMO | Admitting: Physician Assistant

## 2020-09-20 VITALS — BP 126/52 | HR 74 | Ht 67.0 in | Wt 175.0 lb

## 2020-09-20 DIAGNOSIS — I472 Ventricular tachycardia, unspecified: Secondary | ICD-10-CM

## 2020-09-20 DIAGNOSIS — I10 Essential (primary) hypertension: Secondary | ICD-10-CM

## 2020-09-20 DIAGNOSIS — Z95810 Presence of automatic (implantable) cardiac defibrillator: Secondary | ICD-10-CM | POA: Diagnosis not present

## 2020-09-20 DIAGNOSIS — I428 Other cardiomyopathies: Secondary | ICD-10-CM

## 2020-09-20 DIAGNOSIS — Z01818 Encounter for other preprocedural examination: Secondary | ICD-10-CM | POA: Diagnosis not present

## 2020-09-20 LAB — CUP PACEART INCLINIC DEVICE CHECK
Date Time Interrogation Session: 20220406131428
Implantable Lead Implant Date: 20091218
Implantable Lead Implant Date: 20091218
Implantable Lead Location: 753859
Implantable Lead Location: 753860
Implantable Lead Model: 158
Implantable Lead Serial Number: 131093
Implantable Pulse Generator Implant Date: 20091218
Lead Channel Pacing Threshold Amplitude: 0.6 V
Lead Channel Pacing Threshold Amplitude: 0.7 V
Lead Channel Pacing Threshold Pulse Width: 0.5 ms
Lead Channel Pacing Threshold Pulse Width: 0.5 ms
Lead Channel Sensing Intrinsic Amplitude: 3.7 mV
Lead Channel Sensing Intrinsic Amplitude: 4.8 mV
Pulse Gen Serial Number: 131093

## 2020-09-20 NOTE — Patient Instructions (Addendum)
Medication Instructions:    Your physician recommends that you continue on your current medications as directed. Please refer to the Current Medication list given to you today.\ *If you need a refill on your cardiac medications before your next appointment, please call your pharmacy*   Lab Work: BMET AND CBC TODAY   If you have labs (blood work) drawn today and your tests are completely normal, you will receive your results only by: Marland Kitchen MyChart Message (if you have MyChart) OR . A paper copy in the mail If you have any lab test that is abnormal or we need to change your treatment, we will call you to review the results.   Testing/Procedures:  SEE BELOW INSTRUCTIONS FOR DEVICE BATTERY CHANGEOUT     Follow-Up: At [facility], you and your health needs are our priority.  As part of our continuing mission to provide you with exceptional heart care, we have created designated Provider Care Teams.  These Care Teams include your primary Cardiologist (physician) and Advanced Practice Providers (APPs -  Physician Assistants and Nurse Practitioners) who all work together to provide you with the care you need, when you need it.  We recommend signing up for the patient portal called "MyChart".  Sign up information is provided on this After Visit Summary.  MyChart is used to connect with patients for Virtual Visits (Telemedicine).  Patients are able to view lab/test results, encounter notes, upcoming appointments, etc.  Non-urgent messages can be sent to your provider as well.   To learn more about what you can do with MyChart, go to [email or web address].    Your next appointment:  14 days after 11-06-20    [place]    3  month(s) with Dr. Sunnie Nielsen Defib Chk  Other Instructions   Implantable Device Instructions  You are scheduled for: ICD  BATTERY CHANGEOUT  11-06-20  with Dr. Caryl Comes  1.   Pre procedure testing-              A.  LAB WORK--- On 09-20-20  for  your pre procedure blood work.  You do NOT need to be fasting.              B. COVID TEST-- On 11-03-20 - This is a Drive Up Visit at [address] [place], [place] [address].  Someone will direct you to the appropriate testing line. Stay in your car and someone will be with you shortly.   After you are tested please go home and self quarantine until the day of your procedure.    2. On the day of your procedure 11-06-20 you will go to [facility] [phone]) at 7:30 am.  Dennis Bast will go to the main entrance [address] [employer]) and enter where the [employer] are.  You will check in at ADMITTING.  You may have one support person come in to the hospital with you.  They will be asked to wait in the waiting room.   3.   Do not eat or drink after midnight prior to your procedure.   4.   On the morning of your procedure do NOT take any medication.  5.  The night before your procedure and the morning of your procedure scrub your neck/chest with surgical scrub.  See instruction letter.   5.  Plan for an overnight stay, but you may be discharged home after your procedure. If you use  your phone frequently bring your phone charger, in case you have to stay.  If you are discharged after your procedure you will need someone to drive you home and be with your for 24 hours after your procedure.   6.  You will follow up with the [place] clinic 10-14 days after your procedure. You will follow up with Dr. Caryl Comes 91 days after your procedure.  These appointments will be made for you.   * If you have ANY questions after you get home, please call the office [phone] and ask for Sherri RN or send a MyChart message.

## 2020-09-21 DIAGNOSIS — Z992 Dependence on renal dialysis: Secondary | ICD-10-CM | POA: Diagnosis not present

## 2020-09-21 DIAGNOSIS — D689 Coagulation defect, unspecified: Secondary | ICD-10-CM | POA: Diagnosis not present

## 2020-09-21 DIAGNOSIS — D631 Anemia in chronic kidney disease: Secondary | ICD-10-CM | POA: Diagnosis not present

## 2020-09-21 DIAGNOSIS — N186 End stage renal disease: Secondary | ICD-10-CM | POA: Diagnosis not present

## 2020-09-21 DIAGNOSIS — N2581 Secondary hyperparathyroidism of renal origin: Secondary | ICD-10-CM | POA: Diagnosis not present

## 2020-09-21 LAB — CBC
Hematocrit: 37.7 % (ref 37.5–51.0)
Hemoglobin: 12.2 g/dL — ABNORMAL LOW (ref 13.0–17.7)
MCH: 28.8 pg (ref 26.6–33.0)
MCHC: 32.4 g/dL (ref 31.5–35.7)
MCV: 89 fL (ref 79–97)
Platelets: 109 10*3/uL — ABNORMAL LOW (ref 150–450)
RBC: 4.24 x10E6/uL (ref 4.14–5.80)
RDW: 16.1 % — ABNORMAL HIGH (ref 11.6–15.4)
WBC: 5.1 10*3/uL (ref 3.4–10.8)

## 2020-09-21 LAB — BASIC METABOLIC PANEL
BUN/Creatinine Ratio: 8 — ABNORMAL LOW (ref 10–24)
BUN: 36 mg/dL — ABNORMAL HIGH (ref 8–27)
CO2: 23 mmol/L (ref 20–29)
Calcium: 9.9 mg/dL (ref 8.6–10.2)
Chloride: 98 mmol/L (ref 96–106)
Creatinine, Ser: 4.79 mg/dL — ABNORMAL HIGH (ref 0.76–1.27)
Glucose: 91 mg/dL (ref 65–99)
Potassium: 4.4 mmol/L (ref 3.5–5.2)
Sodium: 143 mmol/L (ref 134–144)
eGFR: 12 mL/min/{1.73_m2} — ABNORMAL LOW (ref >59)

## 2020-09-23 DIAGNOSIS — Z992 Dependence on renal dialysis: Secondary | ICD-10-CM | POA: Diagnosis not present

## 2020-09-23 DIAGNOSIS — N186 End stage renal disease: Secondary | ICD-10-CM | POA: Diagnosis not present

## 2020-09-23 DIAGNOSIS — N2581 Secondary hyperparathyroidism of renal origin: Secondary | ICD-10-CM | POA: Diagnosis not present

## 2020-09-23 DIAGNOSIS — D631 Anemia in chronic kidney disease: Secondary | ICD-10-CM | POA: Diagnosis not present

## 2020-09-23 DIAGNOSIS — D689 Coagulation defect, unspecified: Secondary | ICD-10-CM | POA: Diagnosis not present

## 2020-09-25 NOTE — Progress Notes (Signed)
Remote ICD transmission.   

## 2020-09-26 DIAGNOSIS — E1122 Type 2 diabetes mellitus with diabetic chronic kidney disease: Secondary | ICD-10-CM | POA: Diagnosis not present

## 2020-09-26 DIAGNOSIS — D689 Coagulation defect, unspecified: Secondary | ICD-10-CM | POA: Diagnosis not present

## 2020-09-26 DIAGNOSIS — Z992 Dependence on renal dialysis: Secondary | ICD-10-CM | POA: Diagnosis not present

## 2020-09-26 DIAGNOSIS — N186 End stage renal disease: Secondary | ICD-10-CM | POA: Diagnosis not present

## 2020-09-26 DIAGNOSIS — N2581 Secondary hyperparathyroidism of renal origin: Secondary | ICD-10-CM | POA: Diagnosis not present

## 2020-09-28 DIAGNOSIS — E1122 Type 2 diabetes mellitus with diabetic chronic kidney disease: Secondary | ICD-10-CM | POA: Diagnosis not present

## 2020-09-28 DIAGNOSIS — D689 Coagulation defect, unspecified: Secondary | ICD-10-CM | POA: Diagnosis not present

## 2020-09-28 DIAGNOSIS — N2581 Secondary hyperparathyroidism of renal origin: Secondary | ICD-10-CM | POA: Diagnosis not present

## 2020-09-28 DIAGNOSIS — N186 End stage renal disease: Secondary | ICD-10-CM | POA: Diagnosis not present

## 2020-09-28 DIAGNOSIS — Z992 Dependence on renal dialysis: Secondary | ICD-10-CM | POA: Diagnosis not present

## 2020-09-30 ENCOUNTER — Other Ambulatory Visit: Payer: Self-pay | Admitting: Family Medicine

## 2020-09-30 DIAGNOSIS — E1122 Type 2 diabetes mellitus with diabetic chronic kidney disease: Secondary | ICD-10-CM | POA: Diagnosis not present

## 2020-09-30 DIAGNOSIS — Z992 Dependence on renal dialysis: Secondary | ICD-10-CM | POA: Diagnosis not present

## 2020-09-30 DIAGNOSIS — N186 End stage renal disease: Secondary | ICD-10-CM | POA: Diagnosis not present

## 2020-09-30 DIAGNOSIS — N2581 Secondary hyperparathyroidism of renal origin: Secondary | ICD-10-CM | POA: Diagnosis not present

## 2020-09-30 DIAGNOSIS — D689 Coagulation defect, unspecified: Secondary | ICD-10-CM | POA: Diagnosis not present

## 2020-10-03 DIAGNOSIS — D689 Coagulation defect, unspecified: Secondary | ICD-10-CM | POA: Diagnosis not present

## 2020-10-03 DIAGNOSIS — D509 Iron deficiency anemia, unspecified: Secondary | ICD-10-CM | POA: Diagnosis not present

## 2020-10-03 DIAGNOSIS — Z992 Dependence on renal dialysis: Secondary | ICD-10-CM | POA: Diagnosis not present

## 2020-10-03 DIAGNOSIS — N2581 Secondary hyperparathyroidism of renal origin: Secondary | ICD-10-CM | POA: Diagnosis not present

## 2020-10-03 DIAGNOSIS — D631 Anemia in chronic kidney disease: Secondary | ICD-10-CM | POA: Diagnosis not present

## 2020-10-03 DIAGNOSIS — N186 End stage renal disease: Secondary | ICD-10-CM | POA: Diagnosis not present

## 2020-10-05 DIAGNOSIS — D689 Coagulation defect, unspecified: Secondary | ICD-10-CM | POA: Diagnosis not present

## 2020-10-05 DIAGNOSIS — D509 Iron deficiency anemia, unspecified: Secondary | ICD-10-CM | POA: Diagnosis not present

## 2020-10-05 DIAGNOSIS — D631 Anemia in chronic kidney disease: Secondary | ICD-10-CM | POA: Diagnosis not present

## 2020-10-05 DIAGNOSIS — N2581 Secondary hyperparathyroidism of renal origin: Secondary | ICD-10-CM | POA: Diagnosis not present

## 2020-10-05 DIAGNOSIS — Z992 Dependence on renal dialysis: Secondary | ICD-10-CM | POA: Diagnosis not present

## 2020-10-05 DIAGNOSIS — N186 End stage renal disease: Secondary | ICD-10-CM | POA: Diagnosis not present

## 2020-10-07 DIAGNOSIS — D509 Iron deficiency anemia, unspecified: Secondary | ICD-10-CM | POA: Diagnosis not present

## 2020-10-07 DIAGNOSIS — N186 End stage renal disease: Secondary | ICD-10-CM | POA: Diagnosis not present

## 2020-10-07 DIAGNOSIS — Z992 Dependence on renal dialysis: Secondary | ICD-10-CM | POA: Diagnosis not present

## 2020-10-07 DIAGNOSIS — N2581 Secondary hyperparathyroidism of renal origin: Secondary | ICD-10-CM | POA: Diagnosis not present

## 2020-10-07 DIAGNOSIS — D689 Coagulation defect, unspecified: Secondary | ICD-10-CM | POA: Diagnosis not present

## 2020-10-07 DIAGNOSIS — D631 Anemia in chronic kidney disease: Secondary | ICD-10-CM | POA: Diagnosis not present

## 2020-10-09 ENCOUNTER — Telehealth: Payer: Self-pay

## 2020-10-09 NOTE — Progress Notes (Signed)
    Chronic Care Management Pharmacy Assistant   Name: Tyler Hahn  MRN: [id number] DOB: 29-Jun-1940  Reason for Encounter:General Adherence Call   Recent office visits:  No visits noted  Recent consult visits:  No visits noted  Hospital visits:  None in previous 6 months  Medications: Outpatient Encounter Medications as of 10/09/2020  Medication Sig  . allopurinol (ZYLOPRIM) 100 MG tablet TAKE 1 TABLET BY MOUTH EVERY DAY  . atorvastatin (LIPITOR) 20 MG tablet TAKE 1 TABLET BY MOUTH EVERY DAY  . levothyroxine (SYNTHROID) 25 MCG tablet TAKE 1 TABLET BY MOUTH EVERY DAY BEFORE BREAKFAST  . sertraline (ZOLOFT) 100 MG tablet TAKE 1 TABLET BY MOUTH EVERY DAY   No facility-administered encounter medications on file as of 10/09/2020.   Have you had any problems recently with your health? Son stated his father's memory has began to noticeably decline. He stated he made an appointment for 05/02 for his father to see his  PCP regarding this. Otherwise [name] health remains the same. His son has been taking him to family soccer and basketball games.  Have you had any problems with your pharmacy? Patient's son stated no problems with pharmacy cuurently  What issues or side effects are you having with your medications? Patient's son stated no issues with medications currently  What would you like me to pass along to Burtrum Potts,CPP for them to help you with?  Patient's son did not have anything to pass along  What can we do to take care of you better? Patient's son had no suggestions.   Star Rating Drugs: Atorvastatin 20 mg- 30 DS last filled 09/25/20  Wilford Sports CPA, CMA

## 2020-10-10 ENCOUNTER — Ambulatory Visit (INDEPENDENT_AMBULATORY_CARE_PROVIDER_SITE_OTHER): Payer: Medicare HMO

## 2020-10-10 DIAGNOSIS — Z992 Dependence on renal dialysis: Secondary | ICD-10-CM | POA: Diagnosis not present

## 2020-10-10 DIAGNOSIS — N186 End stage renal disease: Secondary | ICD-10-CM | POA: Diagnosis not present

## 2020-10-10 DIAGNOSIS — D689 Coagulation defect, unspecified: Secondary | ICD-10-CM | POA: Diagnosis not present

## 2020-10-10 DIAGNOSIS — I429 Cardiomyopathy, unspecified: Secondary | ICD-10-CM

## 2020-10-10 DIAGNOSIS — D509 Iron deficiency anemia, unspecified: Secondary | ICD-10-CM | POA: Diagnosis not present

## 2020-10-10 DIAGNOSIS — N2581 Secondary hyperparathyroidism of renal origin: Secondary | ICD-10-CM | POA: Diagnosis not present

## 2020-10-10 LAB — CUP PACEART REMOTE DEVICE CHECK
Brady Statistic RA Percent Paced: 1 %
Brady Statistic RV Percent Paced: 3 %
Date Time Interrogation Session: 20220426023100
HighPow Impedance: 50 Ohm
Implantable Lead Implant Date: 20091218
Implantable Lead Implant Date: 20091218
Implantable Lead Location: 753859
Implantable Lead Location: 753860
Implantable Lead Model: 158
Implantable Lead Serial Number: 131093
Implantable Pulse Generator Implant Date: 20091218
Lead Channel Impedance Value: 388 Ohm
Lead Channel Impedance Value: 482 Ohm
Lead Channel Pacing Threshold Amplitude: 0.6 V
Lead Channel Pacing Threshold Amplitude: 0.7 V
Lead Channel Pacing Threshold Pulse Width: 0.5 ms
Lead Channel Pacing Threshold Pulse Width: 0.5 ms
Lead Channel Setting Pacing Amplitude: 2 V
Lead Channel Setting Pacing Amplitude: 2.4 V
Lead Channel Setting Pacing Pulse Width: 0.5 ms
Lead Channel Setting Sensing Sensitivity: 0.6 mV
Pulse Gen Serial Number: 131093

## 2020-10-12 DIAGNOSIS — Z992 Dependence on renal dialysis: Secondary | ICD-10-CM | POA: Diagnosis not present

## 2020-10-12 DIAGNOSIS — D509 Iron deficiency anemia, unspecified: Secondary | ICD-10-CM | POA: Diagnosis not present

## 2020-10-12 DIAGNOSIS — N186 End stage renal disease: Secondary | ICD-10-CM | POA: Diagnosis not present

## 2020-10-12 DIAGNOSIS — N2581 Secondary hyperparathyroidism of renal origin: Secondary | ICD-10-CM | POA: Diagnosis not present

## 2020-10-12 DIAGNOSIS — D689 Coagulation defect, unspecified: Secondary | ICD-10-CM | POA: Diagnosis not present

## 2020-10-12 NOTE — Progress Notes (Signed)
Subjective:   Tyler Hahn is a 80 y.o. male who presents for Medicare Annual/Subsequent preventive examination.  Review of Systems     Cardiac Risk Factors include: diabetes mellitus;dyslipidemia;hypertension;male gender     Objective:    Today's Vitals   10/16/20 1013  BP: 112/68  Pulse: 71  Resp: 18  Temp: 98.2 F (36.8 C)  TempSrc: Temporal  Weight: 176 lb (79.8 kg)  Height: 5\' 7"  (1.702 m)   Body mass index is 27.57 kg/m.  Advanced Directives 10/16/2020 04/25/2020 09/11/2019 08/30/2019 08/17/2019 09/04/2017 06/27/2017  Does Patient Have a Medical Advance Directive? No No No No No Yes No  Type of Advance Directive - - - - - Living will -  Does patient want to make changes to medical advance directive? - - - - - Yes (MAU/Ambulatory/Procedural Areas - Information given) -  Copy of [place] in Chart? - - - - - - -  Would patient like information on creating a medical advance directive? No - Patient declined No - Patient declined No - Patient declined No - Patient declined No - Patient declined - No - Patient declined  Pre-existing out of facility DNR order (yellow form or pink MOST form) - - - - - - -    Current Medications (verified) Outpatient Encounter Medications as of 10/16/2020  Medication Sig  . allopurinol (ZYLOPRIM) 100 MG tablet TAKE 1 TABLET BY MOUTH EVERY DAY  . atorvastatin (LIPITOR) 20 MG tablet TAKE 1 TABLET BY MOUTH EVERY DAY  . donepezil (ARICEPT) 5 MG tablet Take 1 tablet (5 mg total) by mouth at bedtime.  Marland Kitchen levothyroxine (SYNTHROID) 25 MCG tablet TAKE 1 TABLET BY MOUTH EVERY DAY BEFORE BREAKFAST  . sertraline (ZOLOFT) 100 MG tablet TAKE 1 TABLET BY MOUTH EVERY DAY   No facility-administered encounter medications on file as of 10/16/2020.    Allergies (verified) Sulfa antibiotics and Sulfonamide derivatives   History: Past Medical History:  Diagnosis Date  . Anxiety   . Automatic implantable cardioverter-defibrillator in situ     greg taylor  . CHF (congestive heart failure) ([place])    2000  . Diabetes mellitus    no meds  . Fatty liver   . Gout    "bout 2-3 months ago"-meds helped.  . Hyperlipidemia   . Hypertension   . Hyperthyroidism   . Kidney cysts   . PONV (postoperative nausea and vomiting)   . Renal insufficiency    Past Surgical History:  Procedure Laterality Date  . AV FISTULA PLACEMENT Right 08/23/2019   Procedure: ARTERIOVENOUS GORTEX GRAFT RIGHT ARM;  Surgeon: Rosetta Posner, MD;  Location: [facility];  Service: Vascular;  Laterality: Right;  . CARDIAC DEFIBRILLATOR PLACEMENT    . COLONOSCOPY WITH PROPOFOL N/A 10/03/2015   Procedure: COLONOSCOPY WITH PROPOFOL;  Surgeon: Ladene Artist, MD;  Location: [facility];  Service: Endoscopy;  Laterality: N/A;  . DIAGNOSTIC LAPAROSCOPY  01/27/2014   Dr Brantley Stage  . ENTEROSCOPY N/A 11/25/2013   Procedure: ENTEROSCOPY;  Surgeon: Ladene Artist, MD;  Location: [facility];  Service: Endoscopy;  Laterality: N/A;  . ICD,[place]    . LAPAROSCOPY N/A 01/27/2014   Procedure: LAPAROSCOPY DIAGNOSTIC;  Surgeon: Joyice Faster. Cornett, MD;  Location: [facility];  Service: General;  Laterality: N/A;  . polyp removal throat     difficulty speaking   Family History  Problem Relation Age of Onset  . Stomach cancer Mother   . Alzheimer's disease Mother   .  Diabetes Father   . Heart disease Father   . Liver disease Father   . Kidney disease Father    Social History   Socioeconomic History  . Marital status: Widowed    Spouse name: Not on file  . Number of children: 4  . Years of education: Not on file  . Highest education level: Not on file  Occupational History  . Occupation: Retired    Fish farm manager: RETIRED  Tobacco Use  . Smoking status: Former Smoker    Types: Cigarettes    Quit date: 11/26/1998    Years since quitting: 21.9  . Smokeless tobacco: Never Used  Vaping Use  . Vaping Use: Never used  Substance and Sexual Activity  . Alcohol use: No     Alcohol/week: 0.0 standard drinks  . Drug use: No  . Sexual activity: Not on file  Other Topics Concern  . Not on file  Social History Narrative  . Not on file   Social Determinants of Health   Financial Resource Strain: Low Risk   . Difficulty of Paying Living Expenses: Not hard at all  Food Insecurity: No Food Insecurity  . Worried About Charity fundraiser in the Last Year: Never true  . Ran Out of Food in the Last Year: Never true  Transportation Needs: No Transportation Needs  . Lack of Transportation (Medical): No  . Lack of Transportation (Non-Medical): No  Physical Activity: Sufficiently Active  . Days of Exercise per Week: 7 days  . Minutes of Exercise per Session: 30 min  Stress: No Stress Concern Present  . Feeling of Stress : Not at all  Social Connections: Moderately Integrated  . Frequency of Communication with Friends and Family: More than three times a week  . Frequency of Social Gatherings with Friends and Family: More than three times a week  . Attends Religious Services: 1 to 4 times per year  . Active Member of Clubs or Organizations: Yes  . Attends Archivist Meetings: 1 to 4 times per year  . Marital Status: Widowed    Tobacco Counseling Counseling given: Not Answered   Clinical Intake:  Pre-visit preparation completed: Yes  Pain : No/denies pain     Nutritional Status: BMI 25 -29 Overweight Nutritional Risks: None Diabetes: Yes CBG done?: No Did pt. bring in CBG monitor from home?: No  How often do you need to have someone help you when you read instructions, pamphlets, or other written materials from your doctor or pharmacy?: 1 - Never  Diabetes:  Is the patient diabetic?  Yes  If diabetic, was a CBG obtained today?  No  Did the patient bring in their glucometer from home?  No  How often do you monitor your CBG's? daily.   Financial Strains and Diabetes Management:  Are you having any financial strains with the device,  your supplies or your medication? No .  Does the patient want to be seen by Chronic Care Management for management of their diabetes?  No  Would the patient like to be referred to a Nutritionist or for Diabetic Management?  No   Diabetic Exams:  Diabetic Eye Exam: Completed 05/2020. Requested notes be sent to PCP  Diabetic Foot Exam: Pt has been advised about the importance in completing this exam. To be completed by PCP    Interpreter Needed?: No  Information entered by :: Caroleen Hamman LPN   Activities of Daily Living In your present state of health, do you have any  difficulty performing the following activities: 10/16/2020 10/16/2020  Hearing? N N  Vision? N N  Difficulty concentrating or making decisions? Y N  Comment occasionally -  Walking or climbing stairs? N N  Dressing or bathing? N N  Doing errands, shopping? N N  Preparing Food and eating ? N -  Using the Toilet? N -  In the past six months, have you accidently leaked urine? N -  Do you have problems with loss of bowel control? N -  Managing your Medications? N -  Managing your Finances? N -  Housekeeping or managing your Housekeeping? N -  Some recent data might be hidden    Patient Care Team: [name], MD as PCP - General [name], MD as Consulting Physician (Cardiology) Renato Shin, MD as Consulting Physician (Endocrinology) Ladene Artist, MD as Consulting Physician (Gastroenterology) Rosita Fire, MD as Consulting Physician (Nephrology) [name], [facility] as Pharmacist (Pharmacist)  Indicate any recent Medical Services you may have received from other than [facility] providers in the past year (date may be approximate).     Assessment:   This is a routine wellness examination for Gwyndolyn Saxon.  Hearing/Vision screen  Hearing Screening   125Hz  250Hz  500Hz  1000Hz  2000Hz  3000Hz  4000Hz  6000Hz  8000Hz   Right ear:           Left ear:           Comments: No issues  Vision Screening  Comments: Wears glasses Last eye exam-05/2020  Dietary issues and exercise activities discussed: Current Exercise Habits: Home exercise routine, Type of exercise: walking, Time (Minutes): 35, Frequency (Times/Week): 7, Weekly Exercise (Minutes/Week): 245, Intensity: Mild, Exercise limited by: None identified  Goals    . Patient Stated     Maintain healthy active lifestyle    . PharmD Care Plan     CARE PLAN ENTRY (see longitudinal plan of care for additional care plan information)  Current Barriers:  . Chronic Disease Management support, education, and care coordination needs related to Hypertension, Hyperlipidemia, Diabetes, Depression, and Gout.    Hypertension  BP Readings from Last 3 Encounters:  09/29/19 133/68  09/22/19 (!) 122/54  09/11/19 112/61   . Pharmacist Clinical Goal(s): o Over the next 180 days, patient will work with PharmD and providers to maintain BP goal <140/90 . Current regimen:  o No medications at this time o HD tu/th/sat . Interventions: o Continue current management . Patient self care activities - Over the next 180 days, patient will: o Check BP as directed, document, and provide at future appointments o Ensure daily salt intake < 2300 mg/day  Hyperlipidemia Lab Results  Component Value Date/Time   LDLCALC 79 08/16/2019 02:24 PM   [place] 83 07/04/2017 03:55 PM   LDLDIRECT 97.0 06/18/2018 03:31 PM   . Pharmacist Clinical Goal(s): o Over the next 180 days, patient will work with PharmD and providers to maintain LDL goal < 100 . Current regimen:  o Atorvastatin 20 mg once daily  . Interventions: o Continue current management . Patient self care activities - Over the next 180 days, patient will: o Continue current management Diabetes Lab Results  Component Value Date/Time   HGBA1C 6.8 (H) 08/16/2019 02:24 PM   HGBA1C 6.7 (H) 02/18/2019 03:07 PM   . Pharmacist Clinical Goal(s): o Over the next 180 days, patient will work with PharmD and  providers to maintain A1c goal <7% . Current regimen:  o No diabetes medications at this time  . Interventions: o Continue  current management . Patient self care activities - Over the next 180 days, patient will: o Check blood sugar as directed, document, and provide at future appointments o Contact provider with any episodes of hypoglycemia  Gout . Pharmacist Clinical Goal(s) o Over the next 180 days, patient will work with PharmD and providers to minimize symptoms of gout  . Current regimen:  o Allopurinol 100 mg once daily  . Interventions: o Continue current management o Possibly reduce dose if gout controlled at follow-up visit.  . Patient self care activities - Over the next 180 days, patient will: o Continue current management  Gout . Pharmacist Clinical Goal(s) o Over the next 180 days, patient will work with PharmD and providers to minimize symptoms of depression . Current regimen:  o Sertraline 100 mg once daily . Interventions: o Continue current management . Patient self care activities - Over the next 180 days, patient will: o Continue current management  Medication management . Pharmacist Clinical Goal(s): o Over the next 180 days, patient will work with PharmD and providers to maintain optimal medication adherence . Current pharmacy: [facility]  . Interventions o Comprehensive medication review performed. o Continue current medication management strategy . Patient self care activities - Over the next 180 days, patient will: o Focus on medication adherence by Continue current management o Take medications as prescribed o Report any questions or concerns to PharmD and/or provider(s)  Initial goal documentation.      Depression Screen PHQ 2/9 Scores 10/16/2020 10/16/2020 04/26/2020 02/16/2020 12/22/2019 09/20/2019 09/10/2019  PHQ - 2 Score 0 1 0 4 0 0 1  PHQ- 9 Score - 8 0 4 - 0 1    Fall Risk Fall Risk  10/16/2020 10/16/2020 04/26/2020 02/16/2020 09/20/2019   Falls in the past year? 0 0 0 0 0  Number falls in past yr: 0 0 0 0 0  Injury with Fall? 0 0 0 0 0  Comment - - - - -  Risk for fall due to : - No Fall Risks No Fall Risks - -  Follow up Falls prevention discussed - - Falls evaluation completed Falls evaluation completed    [place]:  Any stairs in or around the home? Yes  If so, are there any without handrails? No  Home free of loose throw rugs in walkways, pet beds, electrical cords, etc? Yes  Adequate lighting in your home to reduce risk of falls? Yes   ASSISTIVE DEVICES UTILIZED TO PREVENT FALLS:  Life alert? No  Use of a cane, walker or w/c? No  Grab bars in the bathroom? Yes  Shower chair or bench in shower? No  Elevated toilet seat or a handicapped toilet? No   TIMED UP AND GO:  Was the test performed? Yes .  Length of time to ambulate 10 feet: 10 sec.   Gait slow and steady without use of assistive device  Cognitive Function:Patient saw PCP today for memory loss. MMSE - Mini Mental State Exam 02/14/2015  Orientation to time 5  Orientation to Place 5  Registration 3  Attention/ Calculation 4  Recall 2  Language- name 2 objects 2  Language- repeat 1  Language- follow 3 step command 3  Language- read & follow direction 1  Write a sentence 1  Copy design 1  Total score 28     6CIT Screen 10/16/2020  What Year? 4 points  What month? 3 points  What time? 3 points  Count back from 20 0 points  Months in reverse 4 points  Repeat phrase 10 points  Total Score 24    Immunizations Immunization History  Administered Date(s) Administered  . Fluad Quad(high Dose 65+) 02/18/2019, 02/16/2020  . Influenza Whole 04/04/2010, 06/17/2012  . Influenza, High Dose Seasonal PF 03/18/2018  . Influenza,inj,Quad PF,6+ Mos 03/24/2014, 05/18/2015, 07/04/2017  . Moderna Sars-Covid-2 Vaccination 07/20/2019, 08/21/2019  . Pneumococcal Conjugate-13 02/14/2015  . Pneumococcal Polysaccharide-23  08/15/2011    TDAP status: Due, Education has been provided regarding the importance of this vaccine. Advised may receive this vaccine at local pharmacy or Health Dept. Aware to provide a copy of the vaccination record if obtained from local pharmacy or Health Dept. Verbalized acceptance and understanding.  Flu Vaccine status: Up to date  Pneumococcal vaccine status: Up to date  Covid-19 vaccine status: Information provided on how to obtain vaccines. Booster due  Qualifies for Shingles Vaccine? Yes   Zostavax completed No   Shingrix Completed?: No.    Education has been provided regarding the importance of this vaccine. Patient has been advised to call insurance company to determine out of pocket expense if they have not yet received this vaccine. Advised may also receive vaccine at local pharmacy or Health Dept. Verbalized acceptance and understanding.  Screening Tests Health Maintenance  Topic Date Due  . TETANUS/TDAP  06/17/2017  . OPHTHALMOLOGY EXAM  08/25/2017  . HEMOGLOBIN A1C  02/16/2020  . COVID-19 Vaccine (3 - Booster for Moderna series) 02/21/2020  . FOOT EXAM  08/15/2020  . INFLUENZA VACCINE  01/15/2021  . Hepatitis C Screening  Completed  . PNA vac Low Risk Adult  Completed  . HPV VACCINES  Aged Out    Health Maintenance  Health Maintenance Due  Topic Date Due  . TETANUS/TDAP  06/17/2017  . OPHTHALMOLOGY EXAM  08/25/2017  . HEMOGLOBIN A1C  02/16/2020  . COVID-19 Vaccine (3 - Booster for Moderna series) 02/21/2020  . FOOT EXAM  08/15/2020    Colorectal cancer screening: No longer required.   Lung Cancer Screening: (Low Dose CT Chest recommended if Age 77-80 years, 30 pack-year currently smoking OR have quit w/in 15years.) does not qualify.    Additional Screening:   Hepatitis C Screening: does not qualify  Vision Screening: Recommended annual ophthalmology exams for early detection of glaucoma and other disorders of the eye. Is the patient up to date with  their annual eye exam?  Yes  Who is the provider or what is the name of the office in which the patient attends annual eye exams? Unsure of name   Dental Screening: Recommended annual dental exams for proper oral hygiene  Community Resource Referral / Chronic Care Management: CRR required this visit?  No   CCM required this visit?  No      Plan:     I have personally reviewed and noted the following in the patient's chart:   . Medical and social history . Use of alcohol, tobacco or illicit drugs  . Current medications and supplements . Functional ability and status . Nutritional status . Physical activity . Advanced directives . List of other physicians . Hospitalizations, surgeries, and ER visits in previous 12 months . Vitals . Screenings to include cognitive, depression, and falls . Referrals and appointments  In addition, I have reviewed and discussed with patient certain preventive protocols, quality metrics, and best practice recommendations. A written personalized care plan for preventive services as well as general preventive health recommendations were provided to  patient.     Marta Antu, LPN   08/20/9221  Nurse Health Advisor  Nurse Notes: None

## 2020-10-14 DIAGNOSIS — N186 End stage renal disease: Secondary | ICD-10-CM | POA: Diagnosis not present

## 2020-10-14 DIAGNOSIS — N2581 Secondary hyperparathyroidism of renal origin: Secondary | ICD-10-CM | POA: Diagnosis not present

## 2020-10-14 DIAGNOSIS — D689 Coagulation defect, unspecified: Secondary | ICD-10-CM | POA: Diagnosis not present

## 2020-10-14 DIAGNOSIS — Z992 Dependence on renal dialysis: Secondary | ICD-10-CM | POA: Diagnosis not present

## 2020-10-14 DIAGNOSIS — I129 Hypertensive chronic kidney disease with stage 1 through stage 4 chronic kidney disease, or unspecified chronic kidney disease: Secondary | ICD-10-CM | POA: Diagnosis not present

## 2020-10-14 DIAGNOSIS — D509 Iron deficiency anemia, unspecified: Secondary | ICD-10-CM | POA: Diagnosis not present

## 2020-10-16 ENCOUNTER — Other Ambulatory Visit: Payer: Self-pay

## 2020-10-16 ENCOUNTER — Encounter: Payer: Self-pay | Admitting: Family Medicine

## 2020-10-16 ENCOUNTER — Ambulatory Visit (INDEPENDENT_AMBULATORY_CARE_PROVIDER_SITE_OTHER): Payer: Medicare HMO

## 2020-10-16 ENCOUNTER — Ambulatory Visit (INDEPENDENT_AMBULATORY_CARE_PROVIDER_SITE_OTHER): Payer: Medicare HMO | Admitting: Family Medicine

## 2020-10-16 VITALS — BP 112/68 | HR 71 | Temp 98.2°F | Resp 18 | Ht 67.0 in | Wt 176.0 lb

## 2020-10-16 VITALS — BP 112/68 | HR 71 | Temp 98.2°F | Resp 18 | Ht 67.0 in | Wt 176.2 lb

## 2020-10-16 DIAGNOSIS — E1122 Type 2 diabetes mellitus with diabetic chronic kidney disease: Secondary | ICD-10-CM | POA: Diagnosis not present

## 2020-10-16 DIAGNOSIS — R413 Other amnesia: Secondary | ICD-10-CM | POA: Diagnosis not present

## 2020-10-16 DIAGNOSIS — Z0000 Encounter for general adult medical examination without abnormal findings: Secondary | ICD-10-CM

## 2020-10-16 DIAGNOSIS — N186 End stage renal disease: Secondary | ICD-10-CM

## 2020-10-16 LAB — HEPATIC FUNCTION PANEL
ALT: 13 U/L (ref 0–53)
AST: 15 U/L (ref 0–37)
Albumin: 4.1 g/dL (ref 3.5–5.2)
Alkaline Phosphatase: 69 U/L (ref 39–117)
Bilirubin, Direct: 0.1 mg/dL (ref 0.0–0.3)
Total Bilirubin: 0.5 mg/dL (ref 0.2–1.2)
Total Protein: 7.1 g/dL (ref 6.0–8.3)

## 2020-10-16 LAB — LIPID PANEL
Cholesterol: 208 mg/dL — ABNORMAL HIGH (ref 0–200)
HDL: 39.6 mg/dL (ref >39.00)
LDL Cholesterol: 134 mg/dL — ABNORMAL HIGH (ref 0–99)
NonHDL: 168.71
Total CHOL/HDL Ratio: 5
Triglycerides: 175 mg/dL — ABNORMAL HIGH (ref 0.0–149.0)
VLDL: 35 mg/dL (ref 0.0–40.0)

## 2020-10-16 LAB — BASIC METABOLIC PANEL
BUN: 72 mg/dL — ABNORMAL HIGH (ref 6–23)
CO2: 28 mEq/L (ref 19–32)
Calcium: 9.3 mg/dL (ref 8.4–10.5)
Chloride: 99 mEq/L (ref 96–112)
Creatinine, Ser: 6.94 mg/dL (ref 0.40–1.50)
GFR: 7.05 mL/min — CL (ref >60.00)
Glucose, Bld: 92 mg/dL (ref 70–99)
Potassium: 4.7 mEq/L (ref 3.5–5.1)
Sodium: 141 mEq/L (ref 135–145)

## 2020-10-16 LAB — CBC WITH DIFFERENTIAL/PLATELET
Basophils Absolute: 0.1 10*3/uL (ref 0.0–0.1)
Basophils Relative: 1.2 % (ref 0.0–3.0)
Eosinophils Absolute: 0.3 10*3/uL (ref 0.0–0.7)
Eosinophils Relative: 6 % — ABNORMAL HIGH (ref 0.0–5.0)
HCT: 36 % — ABNORMAL LOW (ref 39.0–52.0)
Hemoglobin: 11.5 g/dL — ABNORMAL LOW (ref 13.0–17.0)
Lymphocytes Relative: 29 % (ref 12.0–46.0)
Lymphs Abs: 1.2 10*3/uL (ref 0.7–4.0)
MCHC: 32.1 g/dL (ref 30.0–36.0)
MCV: 89.9 fl (ref 78.0–100.0)
Monocytes Absolute: 0.7 10*3/uL (ref 0.1–1.0)
Monocytes Relative: 16.2 % — ABNORMAL HIGH (ref 3.0–12.0)
Neutro Abs: 2 10*3/uL (ref 1.4–7.7)
Neutrophils Relative %: 47.6 % (ref 43.0–77.0)
Platelets: 98 10*3/uL — ABNORMAL LOW (ref 150.0–400.0)
RBC: 4 Mil/uL — ABNORMAL LOW (ref 4.22–5.81)
RDW: 16.5 % — ABNORMAL HIGH (ref 11.5–15.5)
WBC: 4.1 10*3/uL (ref 4.0–10.5)

## 2020-10-16 LAB — HEMOGLOBIN A1C: Hgb A1c MFr Bld: 6 % (ref 4.6–6.5)

## 2020-10-16 LAB — TSH: TSH: 3.32 u[IU]/mL (ref 0.35–4.50)

## 2020-10-16 MED ORDER — DONEPEZIL HCL 5 MG PO TABS: 5.0000 mg | ORAL_TABLET | Freq: Every day | ORAL | 3 refills | Status: DC

## 2020-10-16 NOTE — Assessment & Plan Note (Signed)
Pt has had memory issues for a few years.  He stopped driving when he got lost in [place].  However son reports that recently his memory has worsened and this is most notable on HD days.  He is able to eat, take medications, dress, bathe independently but is having issue remember what day it is or when he has appointments.  He will wake up from naps confused and look for his wife (who is deceased).  Discussed that typically memory loss doesn't follow a Tues/Thurs/Sat pattern and it seems that something going on at HD is triggering or worsening his memory issues.  Discussed neuropsych testing but family isn't interested at this time.  Will start low dose Aricept to preserve remaining memory.  Son will talk w/ dialysis to see if he needs a new dry weight or a change in electrolytes.  Will follow.

## 2020-10-16 NOTE — Progress Notes (Signed)
   Subjective:    Patient ID: Tyler Hahn, male    DOB: 19-Feb-1941, 80 y.o.   MRN: [id number]  HPI Memory issues- pt reports he is forgetting appts, gets confused as to what day it is.  Son reports that after HD pt will be tired, take a nap, and then wake up confused looking for his deceased wife.  Son notes that he 'talks in loops'.  Pt is not driving.  sxs are most notable on dialysis days.  Pt is able to manage meds w/ help of a reminder alarm.  DM- chronic problem, currently diet controlled.  Last A1C 6.8%.  Due for eye exam, due for foot exam.  No need for microalbumin due to HD.  No CP, SOB above baseline, HAs, visual changes.  No sores on feet.  Some numbness of feet intermittently  ESRD- pt reports extreme fatigue after dialysis but rare dizziness.   Review of Systems For ROS see HPI   This visit occurred during the SARS-CoV-2 public health emergency.  Safety protocols were in place, including screening questions prior to the visit, additional usage of staff PPE, and extensive cleaning of exam room while observing appropriate contact time as indicated for disinfecting solutions.       Objective:   Physical Exam Constitutional:      General: He is not in acute distress.    Appearance: Normal appearance. He is well-developed. He is not ill-appearing.  HENT:     Head: Normocephalic and atraumatic.  Eyes:     Conjunctiva/sclera: Conjunctivae normal.     Pupils: Pupils are equal, round, and reactive to light.  Neck:     Thyroid: No thyromegaly.  Cardiovascular:     Rate and Rhythm: Normal rate and regular rhythm.     Pulses: Normal pulses.     Heart sounds: Normal heart sounds. No murmur heard.   Pulmonary:     Effort: Pulmonary effort is normal. No respiratory distress.     Breath sounds: Normal breath sounds.  Abdominal:     General: Bowel sounds are normal. There is no distension.     Palpations: Abdomen is soft.  Musculoskeletal:     Cervical back: Normal range  of motion and neck supple.     Right lower leg: No edema.     Left lower leg: No edema.  Lymphadenopathy:     Cervical: No cervical adenopathy.  Skin:    General: Skin is warm and dry.  Neurological:     Mental Status: He is alert and oriented to person, place, and time.     Cranial Nerves: No cranial nerve deficit.  Psychiatric:        Mood and Affect: Mood normal.        Behavior: Behavior normal.        Thought Content: Thought content normal.           Assessment & Plan:

## 2020-10-16 NOTE — Assessment & Plan Note (Signed)
Ongoing issue for pt.  Given the description of his fatigue and memory issues on dialysis days, there needs to be a change.  I am not sure whether it is an electrolyte imbalance or too much fluid is being pulled off, but an adjustment needs to be made.  Son plans to speak w/ dialysis center and come up w/ a plan.

## 2020-10-16 NOTE — Assessment & Plan Note (Signed)
Ongoing issue for pt.  Typically very well controlled w/o medication.  Last A1C 6.8%.  Due for eye exam, foot exam.  Currently asymptomatic.  Check labs and adjust tx plan prn.

## 2020-10-16 NOTE — Patient Instructions (Signed)
Follow up in 1 month to recheck memory We'll notify you of your lab results and make any changes if needed START the Donepezil (Aricept) once daily Continue to eat and drink regularly Talk to dialysis about the trend you have noticed (that things are worse on HD days) Call with any questions or concerns Hang in there!!!

## 2020-10-16 NOTE — Patient Instructions (Signed)
Tyler Hahn , Thank you for taking time to come for your Medicare Wellness Visit. I appreciate your ongoing commitment to your health goals. Please review the following plan we discussed and let me know if I can assist you in the future.   Screening recommendations/referrals: Colonoscopy: No longer required Recommended yearly ophthalmology/optometry visit for glaucoma screening and checkup Recommended yearly dental visit for hygiene and checkup  Vaccinations: Influenza vaccine: Up to date Pneumococcal vaccine: Completed vaccines Tdap vaccine: Discuss with pharmacy Shingles vaccine:Discuss with pharmacy   Covid-19: Booster due  Advanced directives: Please bring a copy for your chart once available  Conditions/risks identified: See problem list  Next appointment: Follow up in one year for your annual wellness visit.   Preventive Care 79 Years and Older, Male Preventive care refers to lifestyle choices and visits with your health care provider that can promote health and wellness. What does preventive care include?  A yearly physical exam. This is also called an annual well check.  Dental exams once or twice a year.  Routine eye exams. Ask your health care provider how often you should have your eyes checked.  Personal lifestyle choices, including:  Daily care of your teeth and gums.  Regular physical activity.  Eating a healthy diet.  Avoiding tobacco and drug use.  Limiting alcohol use.  Practicing safe sex.  Taking low doses of aspirin every day.  Taking vitamin and mineral supplements as recommended by your health care provider. What happens during an annual well check? The services and screenings done by your health care provider during your annual well check will depend on your age, overall health, lifestyle risk factors, and family history of disease. Counseling  Your health care provider may ask you questions about your:  Alcohol use.  Tobacco use.  Drug  use.  Emotional well-being.  Home and relationship well-being.  Sexual activity.  Eating habits.  History of falls.  Memory and ability to understand (cognition).  Work and work Statistician. Screening  You may have the following tests or measurements:  Height, weight, and BMI.  Blood pressure.  Lipid and cholesterol levels. These may be checked every 5 years, or more frequently if you are over 5 years old.  Skin check.  Lung cancer screening. You may have this screening every year starting at age 79 if you have a 30-pack-year history of smoking and currently smoke or have quit within the past 15 years.  Fecal occult blood test (FOBT) of the stool. You may have this test every year starting at age 79.  Flexible sigmoidoscopy or colonoscopy. You may have a sigmoidoscopy every 5 years or a colonoscopy every 10 years starting at age 79.  Prostate cancer screening. Recommendations will vary depending on your family history and other risks.  Hepatitis C blood test.  Hepatitis B blood test.  Sexually transmitted disease (STD) testing.  Diabetes screening. This is done by checking your blood sugar (glucose) after you have not eaten for a while (fasting). You may have this done every 1-3 years.  Abdominal aortic aneurysm (AAA) screening. You may need this if you are a current or former smoker.  Osteoporosis. You may be screened starting at age 79 if you are at high risk. Talk with your health care provider about your test results, treatment options, and if necessary, the need for more tests. Vaccines  Your health care provider may recommend certain vaccines, such as:  Influenza vaccine. This is recommended every year.  Tetanus, diphtheria, and acellular  pertussis (Tdap, Td) vaccine. You may need a Td booster every 10 years.  Zoster vaccine. You may need this after age 79.  Pneumococcal 13-valent conjugate (PCV13) vaccine. One dose is recommended after age  79.  Pneumococcal polysaccharide (PPSV23) vaccine. One dose is recommended after age 79. Talk to your health care provider about which screenings and vaccines you need and how often you need them. This information is not intended to replace advice given to you by your health care provider. Make sure you discuss any questions you have with your health care provider. Document Released: 06/30/2015 Document Revised: 02/21/2016 Document Reviewed: 04/04/2015 Elsevier Interactive Patient Education  2017 [place] Prevention in the Home Falls can cause injuries. They can happen to people of all ages. There are many things you can do to make your home safe and to help prevent falls. What can I do on the outside of my home?  Regularly fix the edges of walkways and driveways and fix any cracks.  Remove anything that might make you trip as you walk through a door, such as a raised step or threshold.  Trim any bushes or trees on the path to your home.  Use bright outdoor lighting.  Clear any walking paths of anything that might make someone trip, such as rocks or tools.  Regularly check to see if handrails are loose or broken. Make sure that both sides of any steps have handrails.  Any raised decks and porches should have guardrails on the edges.  Have any leaves, snow, or ice cleared regularly.  Use sand or salt on walking paths during winter.  Clean up any spills in your garage right away. This includes oil or grease spills. What can I do in the bathroom?  Use night lights.  Install grab bars by the toilet and in the tub and shower. Do not use towel bars as grab bars.  Use non-skid mats or decals in the tub or shower.  If you need to sit down in the shower, use a plastic, non-slip stool.  Keep the floor dry. Clean up any water that spills on the floor as soon as it happens.  Remove soap buildup in the tub or shower regularly.  Attach bath mats securely with double-sided  non-slip rug tape.  Do not have throw rugs and other things on the floor that can make you trip. What can I do in the bedroom?  Use night lights.  Make sure that you have a light by your bed that is easy to reach.  Do not use any sheets or blankets that are too big for your bed. They should not hang down onto the floor.  Have a firm chair that has side arms. You can use this for support while you get dressed.  Do not have throw rugs and other things on the floor that can make you trip. What can I do in the kitchen?  Clean up any spills right away.  Avoid walking on wet floors.  Keep items that you use a lot in easy-to-reach places.  If you need to reach something above you, use a strong step stool that has a grab bar.  Keep electrical cords out of the way.  Do not use floor polish or wax that makes floors slippery. If you must use wax, use non-skid floor wax.  Do not have throw rugs and other things on the floor that can make you trip. What can I do with my stairs?  Do not leave any items on the stairs.  Make sure that there are handrails on both sides of the stairs and use them. Fix handrails that are broken or loose. Make sure that handrails are as long as the stairways.  Check any carpeting to make sure that it is firmly attached to the stairs. Fix any carpet that is loose or worn.  Avoid having throw rugs at the top or bottom of the stairs. If you do have throw rugs, attach them to the floor with carpet tape.  Make sure that you have a light switch at the top of the stairs and the bottom of the stairs. If you do not have them, ask someone to add them for you. What else can I do to help prevent falls?  Wear shoes that:  Do not have high heels.  Have rubber bottoms.  Are comfortable and fit you well.  Are closed at the toe. Do not wear sandals.  If you use a stepladder:  Make sure that it is fully opened. Do not climb a closed stepladder.  Make sure that both  sides of the stepladder are locked into place.  Ask someone to hold it for you, if possible.  Clearly mark and make sure that you can see:  Any grab bars or handrails.  First and last steps.  Where the edge of each step is.  Use tools that help you move around (mobility aids) if they are needed. These include:  Canes.  Walkers.  Scooters.  Crutches.  Turn on the lights when you go into a dark area. Replace any light bulbs as soon as they burn out.  Set up your furniture so you have a clear path. Avoid moving your furniture around.  If any of your floors are uneven, fix them.  If there are any pets around you, be aware of where they are.  Review your medicines with your doctor. Some medicines can make you feel dizzy. This can increase your chance of falling. Ask your doctor what other things that you can do to help prevent falls. This information is not intended to replace advice given to you by your health care provider. Make sure you discuss any questions you have with your health care provider. Document Released: 03/30/2009 Document Revised: 11/09/2015 Document Reviewed: 07/08/2014 Elsevier Interactive Patient Education  2017 [employer].

## 2020-10-17 DIAGNOSIS — Z992 Dependence on renal dialysis: Secondary | ICD-10-CM | POA: Diagnosis not present

## 2020-10-17 DIAGNOSIS — D509 Iron deficiency anemia, unspecified: Secondary | ICD-10-CM | POA: Diagnosis not present

## 2020-10-17 DIAGNOSIS — D689 Coagulation defect, unspecified: Secondary | ICD-10-CM | POA: Diagnosis not present

## 2020-10-17 DIAGNOSIS — N186 End stage renal disease: Secondary | ICD-10-CM | POA: Diagnosis not present

## 2020-10-17 DIAGNOSIS — N2581 Secondary hyperparathyroidism of renal origin: Secondary | ICD-10-CM | POA: Diagnosis not present

## 2020-10-19 DIAGNOSIS — N2581 Secondary hyperparathyroidism of renal origin: Secondary | ICD-10-CM | POA: Diagnosis not present

## 2020-10-19 DIAGNOSIS — Z992 Dependence on renal dialysis: Secondary | ICD-10-CM | POA: Diagnosis not present

## 2020-10-19 DIAGNOSIS — D689 Coagulation defect, unspecified: Secondary | ICD-10-CM | POA: Diagnosis not present

## 2020-10-19 DIAGNOSIS — N186 End stage renal disease: Secondary | ICD-10-CM | POA: Diagnosis not present

## 2020-10-19 DIAGNOSIS — D509 Iron deficiency anemia, unspecified: Secondary | ICD-10-CM | POA: Diagnosis not present

## 2020-10-21 DIAGNOSIS — Z992 Dependence on renal dialysis: Secondary | ICD-10-CM | POA: Diagnosis not present

## 2020-10-21 DIAGNOSIS — D689 Coagulation defect, unspecified: Secondary | ICD-10-CM | POA: Diagnosis not present

## 2020-10-21 DIAGNOSIS — N2581 Secondary hyperparathyroidism of renal origin: Secondary | ICD-10-CM | POA: Diagnosis not present

## 2020-10-21 DIAGNOSIS — D509 Iron deficiency anemia, unspecified: Secondary | ICD-10-CM | POA: Diagnosis not present

## 2020-10-21 DIAGNOSIS — N186 End stage renal disease: Secondary | ICD-10-CM | POA: Diagnosis not present

## 2020-10-24 DIAGNOSIS — D509 Iron deficiency anemia, unspecified: Secondary | ICD-10-CM | POA: Diagnosis not present

## 2020-10-24 DIAGNOSIS — Z992 Dependence on renal dialysis: Secondary | ICD-10-CM | POA: Diagnosis not present

## 2020-10-24 DIAGNOSIS — N2581 Secondary hyperparathyroidism of renal origin: Secondary | ICD-10-CM | POA: Diagnosis not present

## 2020-10-24 DIAGNOSIS — N186 End stage renal disease: Secondary | ICD-10-CM | POA: Diagnosis not present

## 2020-10-24 DIAGNOSIS — D689 Coagulation defect, unspecified: Secondary | ICD-10-CM | POA: Diagnosis not present

## 2020-10-26 DIAGNOSIS — N186 End stage renal disease: Secondary | ICD-10-CM | POA: Diagnosis not present

## 2020-10-26 DIAGNOSIS — N2581 Secondary hyperparathyroidism of renal origin: Secondary | ICD-10-CM | POA: Diagnosis not present

## 2020-10-26 DIAGNOSIS — D509 Iron deficiency anemia, unspecified: Secondary | ICD-10-CM | POA: Diagnosis not present

## 2020-10-26 DIAGNOSIS — D689 Coagulation defect, unspecified: Secondary | ICD-10-CM | POA: Diagnosis not present

## 2020-10-26 DIAGNOSIS — Z992 Dependence on renal dialysis: Secondary | ICD-10-CM | POA: Diagnosis not present

## 2020-10-28 DIAGNOSIS — N2581 Secondary hyperparathyroidism of renal origin: Secondary | ICD-10-CM | POA: Diagnosis not present

## 2020-10-28 DIAGNOSIS — D509 Iron deficiency anemia, unspecified: Secondary | ICD-10-CM | POA: Diagnosis not present

## 2020-10-28 DIAGNOSIS — D689 Coagulation defect, unspecified: Secondary | ICD-10-CM | POA: Diagnosis not present

## 2020-10-28 DIAGNOSIS — N186 End stage renal disease: Secondary | ICD-10-CM | POA: Diagnosis not present

## 2020-10-28 DIAGNOSIS — Z992 Dependence on renal dialysis: Secondary | ICD-10-CM | POA: Diagnosis not present

## 2020-10-31 ENCOUNTER — Other Ambulatory Visit: Payer: Self-pay | Admitting: Family Medicine

## 2020-10-31 DIAGNOSIS — Z992 Dependence on renal dialysis: Secondary | ICD-10-CM | POA: Diagnosis not present

## 2020-10-31 DIAGNOSIS — D509 Iron deficiency anemia, unspecified: Secondary | ICD-10-CM | POA: Diagnosis not present

## 2020-10-31 DIAGNOSIS — N2581 Secondary hyperparathyroidism of renal origin: Secondary | ICD-10-CM | POA: Diagnosis not present

## 2020-10-31 DIAGNOSIS — N186 End stage renal disease: Secondary | ICD-10-CM | POA: Diagnosis not present

## 2020-10-31 DIAGNOSIS — D689 Coagulation defect, unspecified: Secondary | ICD-10-CM | POA: Diagnosis not present

## 2020-11-01 NOTE — Progress Notes (Signed)
Remote ICD transmission.   

## 2020-11-02 DIAGNOSIS — N2581 Secondary hyperparathyroidism of renal origin: Secondary | ICD-10-CM | POA: Diagnosis not present

## 2020-11-02 DIAGNOSIS — D509 Iron deficiency anemia, unspecified: Secondary | ICD-10-CM | POA: Diagnosis not present

## 2020-11-02 DIAGNOSIS — D689 Coagulation defect, unspecified: Secondary | ICD-10-CM | POA: Diagnosis not present

## 2020-11-02 DIAGNOSIS — N186 End stage renal disease: Secondary | ICD-10-CM | POA: Diagnosis not present

## 2020-11-02 DIAGNOSIS — Z992 Dependence on renal dialysis: Secondary | ICD-10-CM | POA: Diagnosis not present

## 2020-11-03 ENCOUNTER — Other Ambulatory Visit (HOSPITAL_COMMUNITY)
Admission: RE | Admit: 2020-11-03 | Discharge: 2020-11-03 | Disposition: A | Discharge status: Home / Self Care | Payer: Medicare HMO | Source: Ambulatory Visit | Attending: Internal Medicine | Admitting: Internal Medicine

## 2020-11-03 DIAGNOSIS — Z01812 Encounter for preprocedural laboratory examination: Secondary | ICD-10-CM | POA: Diagnosis not present

## 2020-11-03 DIAGNOSIS — Z20822 Contact with and (suspected) exposure to covid-19: Secondary | ICD-10-CM | POA: Diagnosis not present

## 2020-11-03 LAB — SARS CORONAVIRUS 2 (TAT 6-24 HRS): SARS Coronavirus 2: NEGATIVE

## 2020-11-03 NOTE — Pre-Procedure Instructions (Signed)
Attempted to call patient regarding procedure instructions for Monday.  Left voice mail on the following items: Arrival time 0730 Nothing to eat or drink after midnight No meds AM of procedure Responsible person to drive you home and stay with you for 24 hrs Wash with special soap night before and morning of procedure

## 2020-11-04 DIAGNOSIS — N2581 Secondary hyperparathyroidism of renal origin: Secondary | ICD-10-CM | POA: Diagnosis not present

## 2020-11-04 DIAGNOSIS — D689 Coagulation defect, unspecified: Secondary | ICD-10-CM | POA: Diagnosis not present

## 2020-11-04 DIAGNOSIS — D509 Iron deficiency anemia, unspecified: Secondary | ICD-10-CM | POA: Diagnosis not present

## 2020-11-04 DIAGNOSIS — Z992 Dependence on renal dialysis: Secondary | ICD-10-CM | POA: Diagnosis not present

## 2020-11-04 DIAGNOSIS — N186 End stage renal disease: Secondary | ICD-10-CM | POA: Diagnosis not present

## 2020-11-06 ENCOUNTER — Ambulatory Visit (HOSPITAL_COMMUNITY)
Admission: RE | Disposition: A | Discharge status: Home / Self Care | Payer: Medicare HMO | Source: Home / Self Care | Attending: Internal Medicine

## 2020-11-06 ENCOUNTER — Ambulatory Visit (HOSPITAL_COMMUNITY)
Admission: RE | Admit: 2020-11-06 | Discharge: 2020-11-06 | Disposition: A | Discharge status: Home / Self Care | Payer: Medicare HMO | Attending: Internal Medicine | Admitting: Internal Medicine

## 2020-11-06 ENCOUNTER — Encounter (HOSPITAL_COMMUNITY): Payer: Self-pay | Admitting: Internal Medicine

## 2020-11-06 DIAGNOSIS — Z8379 Family history of other diseases of the digestive system: Secondary | ICD-10-CM | POA: Insufficient documentation

## 2020-11-06 DIAGNOSIS — I428 Other cardiomyopathies: Secondary | ICD-10-CM

## 2020-11-06 DIAGNOSIS — Z79899 Other long term (current) drug therapy: Secondary | ICD-10-CM | POA: Diagnosis not present

## 2020-11-06 DIAGNOSIS — Z800 Family history of malignant neoplasm of digestive organs: Secondary | ICD-10-CM | POA: Insufficient documentation

## 2020-11-06 DIAGNOSIS — E785 Hyperlipidemia, unspecified: Secondary | ICD-10-CM | POA: Diagnosis not present

## 2020-11-06 DIAGNOSIS — Z4502 Encounter for adjustment and management of automatic implantable cardiac defibrillator: Secondary | ICD-10-CM | POA: Insufficient documentation

## 2020-11-06 DIAGNOSIS — Z87891 Personal history of nicotine dependence: Secondary | ICD-10-CM | POA: Diagnosis not present

## 2020-11-06 DIAGNOSIS — E876 Hypokalemia: Secondary | ICD-10-CM | POA: Diagnosis not present

## 2020-11-06 DIAGNOSIS — F0390 Unspecified dementia without behavioral disturbance: Secondary | ICD-10-CM | POA: Insufficient documentation

## 2020-11-06 DIAGNOSIS — Z841 Family history of disorders of kidney and ureter: Secondary | ICD-10-CM | POA: Insufficient documentation

## 2020-11-06 DIAGNOSIS — Z833 Family history of diabetes mellitus: Secondary | ICD-10-CM | POA: Insufficient documentation

## 2020-11-06 DIAGNOSIS — K760 Fatty (change of) liver, not elsewhere classified: Secondary | ICD-10-CM | POA: Insufficient documentation

## 2020-11-06 DIAGNOSIS — I132 Hypertensive heart and chronic kidney disease with heart failure and with stage 5 chronic kidney disease, or end stage renal disease: Secondary | ICD-10-CM | POA: Insufficient documentation

## 2020-11-06 DIAGNOSIS — I5022 Chronic systolic (congestive) heart failure: Secondary | ICD-10-CM | POA: Diagnosis not present

## 2020-11-06 DIAGNOSIS — Z8249 Family history of ischemic heart disease and other diseases of the circulatory system: Secondary | ICD-10-CM | POA: Insufficient documentation

## 2020-11-06 DIAGNOSIS — N186 End stage renal disease: Secondary | ICD-10-CM | POA: Diagnosis not present

## 2020-11-06 DIAGNOSIS — Z882 Allergy status to sulfonamides status: Secondary | ICD-10-CM | POA: Diagnosis not present

## 2020-11-06 DIAGNOSIS — Z820 Family history of epilepsy and other diseases of the nervous system: Secondary | ICD-10-CM | POA: Diagnosis not present

## 2020-11-06 DIAGNOSIS — Z992 Dependence on renal dialysis: Secondary | ICD-10-CM | POA: Diagnosis not present

## 2020-11-06 DIAGNOSIS — R69 Illness, unspecified: Secondary | ICD-10-CM | POA: Diagnosis not present

## 2020-11-06 DIAGNOSIS — E1122 Type 2 diabetes mellitus with diabetic chronic kidney disease: Secondary | ICD-10-CM | POA: Insufficient documentation

## 2020-11-06 DIAGNOSIS — Z95828 Presence of other vascular implants and grafts: Secondary | ICD-10-CM | POA: Diagnosis not present

## 2020-11-06 HISTORY — PX: ICD GENERATOR CHANGEOUT: EP1231

## 2020-11-06 SURGERY — ICD GENERATOR CHANGEOUT

## 2020-11-06 MED ORDER — SODIUM CHLORIDE 0.9 % IV SOLN: INTRAVENOUS | Status: DC

## 2020-11-06 MED ORDER — LIDOCAINE HCL (PF) 1 % IJ SOLN
INTRAMUSCULAR | Status: AC
  Filled 2020-11-06: qty 60

## 2020-11-06 MED ORDER — CEFAZOLIN SODIUM-DEXTROSE 2-4 GM/100ML-% IV SOLN
2.0000 g | INTRAVENOUS | Status: AC
  Administered 2020-11-06: 2 g via INTRAVENOUS

## 2020-11-06 MED ORDER — CEFAZOLIN SODIUM-DEXTROSE 2-4 GM/100ML-% IV SOLN
INTRAVENOUS | Status: AC
  Filled 2020-11-06: qty 100

## 2020-11-06 MED ORDER — SODIUM CHLORIDE 0.9 % IV SOLN
INTRAVENOUS | Status: AC
  Filled 2020-11-06: qty 2

## 2020-11-06 MED ORDER — LIDOCAINE HCL (PF) 1 % IJ SOLN
INTRAMUSCULAR | Status: DC | PRN
  Administered 2020-11-06: 60 mL

## 2020-11-06 MED ORDER — ACETAMINOPHEN 325 MG PO TABS
325.0000 mg | ORAL_TABLET | ORAL | Status: DC | PRN
  Filled 2020-11-06: qty 2

## 2020-11-06 MED ORDER — CHLORHEXIDINE GLUCONATE 4 % EX LIQD
4.0000 | Freq: Once | CUTANEOUS | Status: DC
Start: 2020-11-06 — End: 2020-11-06

## 2020-11-06 MED ORDER — SODIUM CHLORIDE 0.9 % IV SOLN
80.0000 mg | INTRAVENOUS | Status: AC
  Administered 2020-11-06: 80 mg

## 2020-11-06 SURGICAL SUPPLY — 5 items
CABLE SURGICAL S-101-97-12 (CABLE) ×2 IMPLANT
HEMOSTAT SURGICEL 2X4 FIBR (HEMOSTASIS) ×2 IMPLANT
ICD MOMENTUM D121 (ICD Generator) ×2 IMPLANT
PAD PRO RADIOLUCENT 2001M-C (PAD) ×2 IMPLANT
TRAY PACEMAKER INSERTION (PACKS) ×2 IMPLANT

## 2020-11-06 NOTE — Discharge Instructions (Signed)

## 2020-11-06 NOTE — H&P (Addendum)
ICD Criteria  Current LVEF:20-25%. Within 12 months prior to implant: No   Heart failure history: Yes, Class II  Cardiomyopathy history: Yes, Non-Ischemic Cardiomyopathy.  Atrial Fibrillation/Atrial Flutter: No.  Ventricular tachycardia history: No.  Cardiac arrest history: No.  History of syndromes with risk of sudden death: No.  Previous ICD: Yes, Reason for ICD:  Primary prevention.  Current ICD indication: Primary  PPM indication: No.  Class I or II Bradycardia indication present: No  Beta Blocker therapy for 3 or more months: No, medical reason.  Ace Inhibitor/ARB therapy for 3 or more months: No, medical reason.   I have seen Tyler Hahn is a 80 y.o. malepre-procedural and has been referred for consideration of ICD reimplant for primary prevention of sudden death.  The patient's chart has been reviewed and they meet criteria for ICD implant.  I have had a thorough discussion with the patient reviewing options.  The patient and their family (if available) have had opportunities to ask questions and have them answered. The patient and I have decided together through the [employer] to reimplant ICD at this time.  Risks, benefits, alternatives to ICD implantation were discussed in detail with the patient today. The patient  understands that the risks include but are not limited to bleeding, infection, pneumothorax, perforation, tamponade, vascular damage, renal failure, MI, stroke, death, inappropriate shocks, and lead dislodgement and   wishes to proceed.       Patient Care Team: [name], MD as PCP - General [name], MD as Consulting Physician (Cardiology) Renato Shin, MD as Consulting Physician (Endocrinology) Ladene Artist, MD as Consulting Physician (Gastroenterology) Rosita Fire, MD as Consulting Physician (Nephrology) [name], [facility] as Pharmacist (Pharmacist)   HPI  Tyler Hahn is a  80 y.o. male ADMITTED for ICD generator replacement initially implanted 20 for primary prevention.  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   Hyperlipidemia    Hypertension    Hyperthyroidism    Kidney cysts    PONV (postoperative nausea and vomiting)    Renal insufficiency     Past Surgical History:  Procedure Laterality Date   AV FISTULA PLACEMENT Right 08/23/2019   Procedure: ARTERIOVENOUS GORTEX GRAFT RIGHT ARM;  Surgeon: Rosetta Posner, MD;  Location: [facility] OR;  Service: Vascular;  Laterality: Right;   CARDIAC DEFIBRILLATOR PLACEMENT     COLONOSCOPY WITH PROPOFOL N/A 10/03/2015   Procedure: COLONOSCOPY WITH PROPOFOL;  Surgeon: Ladene Artist, MD;  Location: [facility] ENDOSCOPY;  Service: Endoscopy;  Laterality: N/A;   DIAGNOSTIC LAPAROSCOPY  01/27/2014   Dr Brantley Stage   ENTEROSCOPY N/A 11/25/2013   Procedure: ENTEROSCOPY;  Surgeon: Ladene Artist, MD;  Location: [facility] ENDOSCOPY;  Service: Endoscopy;  Laterality: N/A;   ICD,[employer]     LAPAROSCOPY N/A 01/27/2014   Procedure: LAPAROSCOPY DIAGNOSTIC;  Surgeon:  Thomas A. Cornett, MD;  Location: [facility] OR;  Service: General;  Laterality: N/A;   polyp removal throat     difficulty speaking    Current Facility-Administered Medications  Medication Dose Route Frequency Provider Last Rate Last Admin   0.9 %  sodium chloride infusion   Intravenous Continuous Baldwin Jamaica, PA-C 50 mL/hr at  11/06/20 1740 New Bag at 11/06/20 0817   ceFAZolin (ANCEF) IVPB 2g/100 mL premix  2 g Intravenous On Call Baldwin Jamaica, PA-C       chlorhexidine (HIBICLENS) 4 % liquid 4 application  4 application Topical Once Baldwin Jamaica, PA-C       gentamicin (GARAMYCIN) 80 mg in sodium chloride 0.9 % 500 mL irrigation  80 mg Irrigation On Call Baldwin Jamaica, PA-C        Allergies  Allergen Reactions   Sulfa Antibiotics Itching and Rash   Sulfonamide Derivatives Itching and Rash      Social History   Tobacco Use   Smoking status: Former Smoker    Types: Cigarettes    Quit date: 11/26/1998    Years since quitting: 21.9   Smokeless tobacco: Never Used  Vaping Use   Vaping Use: Never used  Substance Use Topics   Alcohol use: No    Alcohol/week: 0.0 standard drinks   Drug use: No     Family History  Problem Relation Age of Onset   Stomach cancer Mother    Alzheimer's disease Mother    Diabetes Father    Heart disease Father    Liver disease Father    Kidney disease Father      Current Meds  Medication Sig   atorvastatin (LIPITOR) 20 MG tablet TAKE 1 TABLET BY MOUTH EVERY DAY (Patient taking differently: Take 20 mg by mouth daily.)   AURYXIA 1 GM 210 MG(Fe) tablet Take 210 mg by mouth 3 (three) times daily.   donepezil (ARICEPT) 5 MG tablet Take 1 tablet (5 mg total) by mouth at bedtime.   levothyroxine (SYNTHROID) 25 MCG tablet TAKE 1 TABLET BY MOUTH EVERY DAY BEFORE BREAKFAST   lidocaine-prilocaine (EMLA) cream Apply 1 application topically once.   Multiple Vitamins-Minerals (MULTIVITAMIN WITH MINERALS) tablet Take 1 tablet by mouth daily.   OVER THE COUNTER MEDICATION Take 1 capsule by mouth daily. Brain Food   sertraline (ZOLOFT) 100 MG tablet TAKE 1 TABLET BY MOUTH EVERY DAY (Patient taking differently: Take 100 mg by mouth daily.)   [DISCONTINUED] allopurinol (ZYLOPRIM) 100 MG tablet TAKE 1 TABLET BY MOUTH EVERY DAY (Patient taking differently: Take 100 mg by mouth  every Monday, Wednesday, and Friday.)   [DISCONTINUED] levothyroxine (SYNTHROID) 25 MCG tablet TAKE 1 TABLET BY MOUTH EVERY DAY BEFORE BREAKFAST (Patient taking differently: Take 25 mcg by mouth daily before breakfast.)     Review of Systems negative except from HPI and PMH  Physical Exam BP 131/65   Pulse 75   Temp 97.7 F (36.5 C) (Oral)   Ht 5\' 7"  (1.702 m)   Wt 81.2 kg   SpO2 99%   BMI 28.04 kg/m  Well developed and well nourished in no acute distress HENT normal E scleral and icterus clear Neck Supple Clear to ausculation  Regular rate and rhythm, no murmurs gallops or rub Soft with active bowel sounds No clubbing cyanosis Trace Edema Alert and oriented x 1 , grossly normal motor and sensory function Skin Warm and Dry    Assessment and  Plan   Nonischemic cardiomyopathy  Congestive heart failure-chronic-systolic      Hypertension    VT/VF ( setting of hypokalemia)  ESRD on hemodialysis     Implantable defibrillator-[employer] dual-chamber @ ERI   Dementia   The patient has elected with discussions with son to proceed with ICD implantation, this despite comorbidities.  We will review options of device inactivation as situations progress.  We have reviewed the benefits and risks of generator replacement.  These include but are not limited to lead fracture and infection.  The patient understands, agrees and is willing to proceed.

## 2020-11-07 DIAGNOSIS — Z992 Dependence on renal dialysis: Secondary | ICD-10-CM | POA: Diagnosis not present

## 2020-11-07 DIAGNOSIS — D509 Iron deficiency anemia, unspecified: Secondary | ICD-10-CM | POA: Diagnosis not present

## 2020-11-07 DIAGNOSIS — D689 Coagulation defect, unspecified: Secondary | ICD-10-CM | POA: Diagnosis not present

## 2020-11-07 DIAGNOSIS — N2581 Secondary hyperparathyroidism of renal origin: Secondary | ICD-10-CM | POA: Diagnosis not present

## 2020-11-07 DIAGNOSIS — N186 End stage renal disease: Secondary | ICD-10-CM | POA: Diagnosis not present

## 2020-11-07 MED FILL — Gentamicin Sulfate Inj 40 MG/ML: INTRAMUSCULAR | Qty: 80 | Status: AC

## 2020-11-07 MED FILL — Lidocaine HCl Local Preservative Free (PF) Inj 1%: INTRAMUSCULAR | Qty: 30 | Status: AC

## 2020-11-07 MED FILL — Cefazolin Sodium-Dextrose IV Solution 2 GM/100ML-4%: INTRAVENOUS | Qty: 100 | Status: AC

## 2020-11-09 DIAGNOSIS — Z992 Dependence on renal dialysis: Secondary | ICD-10-CM | POA: Diagnosis not present

## 2020-11-09 DIAGNOSIS — D509 Iron deficiency anemia, unspecified: Secondary | ICD-10-CM | POA: Diagnosis not present

## 2020-11-09 DIAGNOSIS — D689 Coagulation defect, unspecified: Secondary | ICD-10-CM | POA: Diagnosis not present

## 2020-11-09 DIAGNOSIS — N186 End stage renal disease: Secondary | ICD-10-CM | POA: Diagnosis not present

## 2020-11-09 DIAGNOSIS — N2581 Secondary hyperparathyroidism of renal origin: Secondary | ICD-10-CM | POA: Diagnosis not present

## 2020-11-11 DIAGNOSIS — D509 Iron deficiency anemia, unspecified: Secondary | ICD-10-CM | POA: Diagnosis not present

## 2020-11-11 DIAGNOSIS — N186 End stage renal disease: Secondary | ICD-10-CM | POA: Diagnosis not present

## 2020-11-11 DIAGNOSIS — N2581 Secondary hyperparathyroidism of renal origin: Secondary | ICD-10-CM | POA: Diagnosis not present

## 2020-11-11 DIAGNOSIS — D689 Coagulation defect, unspecified: Secondary | ICD-10-CM | POA: Diagnosis not present

## 2020-11-11 DIAGNOSIS — Z992 Dependence on renal dialysis: Secondary | ICD-10-CM | POA: Diagnosis not present

## 2020-11-14 DIAGNOSIS — I129 Hypertensive chronic kidney disease with stage 1 through stage 4 chronic kidney disease, or unspecified chronic kidney disease: Secondary | ICD-10-CM | POA: Diagnosis not present

## 2020-11-14 DIAGNOSIS — D689 Coagulation defect, unspecified: Secondary | ICD-10-CM | POA: Diagnosis not present

## 2020-11-14 DIAGNOSIS — N186 End stage renal disease: Secondary | ICD-10-CM | POA: Diagnosis not present

## 2020-11-14 DIAGNOSIS — Z992 Dependence on renal dialysis: Secondary | ICD-10-CM | POA: Diagnosis not present

## 2020-11-14 DIAGNOSIS — N2581 Secondary hyperparathyroidism of renal origin: Secondary | ICD-10-CM | POA: Diagnosis not present

## 2020-11-16 DIAGNOSIS — N186 End stage renal disease: Secondary | ICD-10-CM | POA: Diagnosis not present

## 2020-11-16 DIAGNOSIS — Z992 Dependence on renal dialysis: Secondary | ICD-10-CM | POA: Diagnosis not present

## 2020-11-16 DIAGNOSIS — N2581 Secondary hyperparathyroidism of renal origin: Secondary | ICD-10-CM | POA: Diagnosis not present

## 2020-11-16 DIAGNOSIS — D509 Iron deficiency anemia, unspecified: Secondary | ICD-10-CM | POA: Diagnosis not present

## 2020-11-16 DIAGNOSIS — D689 Coagulation defect, unspecified: Secondary | ICD-10-CM | POA: Diagnosis not present

## 2020-11-17 ENCOUNTER — Telehealth: Payer: Self-pay | Admitting: Family Medicine

## 2020-11-17 ENCOUNTER — Telehealth: Payer: Medicare HMO

## 2020-11-17 NOTE — Telephone Encounter (Signed)
Deidre with [name] called in asking for verbal orders to do a speech therapy eval . She would like to use the dx code of dementia if Dr. Birdie Riddle is ok with that.  She would like the last office visit with med list faxed to [phone]  [facility] to lm if no answer at [phone]

## 2020-11-17 NOTE — Telephone Encounter (Signed)
DeBary for speech eval- dx dementia.  Ok to fax med list and last OV

## 2020-11-17 NOTE — Telephone Encounter (Signed)
Called to give verbal order. Verbal order given to Deidre, would also like to know if they can use dx of dementia.

## 2020-11-18 DIAGNOSIS — N186 End stage renal disease: Secondary | ICD-10-CM | POA: Diagnosis not present

## 2020-11-18 DIAGNOSIS — D689 Coagulation defect, unspecified: Secondary | ICD-10-CM | POA: Diagnosis not present

## 2020-11-18 DIAGNOSIS — D509 Iron deficiency anemia, unspecified: Secondary | ICD-10-CM | POA: Diagnosis not present

## 2020-11-18 DIAGNOSIS — Z992 Dependence on renal dialysis: Secondary | ICD-10-CM | POA: Diagnosis not present

## 2020-11-18 DIAGNOSIS — N2581 Secondary hyperparathyroidism of renal origin: Secondary | ICD-10-CM | POA: Diagnosis not present

## 2020-11-20 ENCOUNTER — Other Ambulatory Visit: Payer: Self-pay

## 2020-11-20 ENCOUNTER — Ambulatory Visit: Payer: Medicare HMO | Admitting: Student

## 2020-11-20 ENCOUNTER — Telehealth: Payer: Self-pay | Admitting: Family Medicine

## 2020-11-20 DIAGNOSIS — I472 Ventricular tachycardia, unspecified: Secondary | ICD-10-CM

## 2020-11-20 DIAGNOSIS — I428 Other cardiomyopathies: Secondary | ICD-10-CM

## 2020-11-20 DIAGNOSIS — I429 Cardiomyopathy, unspecified: Secondary | ICD-10-CM

## 2020-11-20 LAB — CUP PACEART INCLINIC DEVICE CHECK
Date Time Interrogation Session: 20220606101334
HighPow Impedance: 42 Ohm
Implantable Lead Implant Date: 20091218
Implantable Lead Implant Date: 20091218
Implantable Lead Location: 753859
Implantable Lead Location: 753860
Implantable Lead Model: 158
Implantable Lead Serial Number: 131093
Implantable Pulse Generator Implant Date: 20220523
Lead Channel Impedance Value: 407 Ohm
Lead Channel Impedance Value: 468 Ohm
Lead Channel Pacing Threshold Amplitude: 0.7 V
Lead Channel Pacing Threshold Amplitude: 0.7 V
Lead Channel Pacing Threshold Pulse Width: 0.4 ms
Lead Channel Pacing Threshold Pulse Width: 0.4 ms
Lead Channel Sensing Intrinsic Amplitude: 3.9 mV
Lead Channel Sensing Intrinsic Amplitude: 5.3 mV
Lead Channel Setting Pacing Amplitude: 2 V
Lead Channel Setting Pacing Amplitude: 2.4 V
Lead Channel Setting Pacing Pulse Width: 0.4 ms
Lead Channel Setting Sensing Sensitivity: 0.5 mV
Pulse Gen Serial Number: 232629

## 2020-11-20 NOTE — Telephone Encounter (Signed)
Yes.  This is the 3rd time they have asked the same question.  The answer is yes.  Use dementia as the diagnosis

## 2020-11-20 NOTE — Telephone Encounter (Signed)
Spoke with Deidra with [name] to give pcp information.

## 2020-11-20 NOTE — Telephone Encounter (Signed)
Tyler Hahn needs to know if it is okay to use the treatment diagnosis of dementia?  Please advise.

## 2020-11-20 NOTE — Telephone Encounter (Signed)
Yes- dx of dementia is appropriate

## 2020-11-20 NOTE — Progress Notes (Signed)
Wound check appointment. Excess dermabond removed. Wound without redness or edema. Incision edges approximated, wound well healed. Normal device function. Thresholds, sensing, and impedances consistent with implant measurements. Thresholds consistent with chronic settings for his chronic leads.  Occasional PMT asymptomatic and terminated by device. No changes made today. Patient educated about wound care, arm mobility, lifting restrictions, shock plan. ROV in 3 months with Dr. Caryl Comes

## 2020-11-21 DIAGNOSIS — E1122 Type 2 diabetes mellitus with diabetic chronic kidney disease: Secondary | ICD-10-CM | POA: Diagnosis not present

## 2020-11-21 DIAGNOSIS — N186 End stage renal disease: Secondary | ICD-10-CM | POA: Diagnosis not present

## 2020-11-21 DIAGNOSIS — Z992 Dependence on renal dialysis: Secondary | ICD-10-CM | POA: Diagnosis not present

## 2020-11-21 DIAGNOSIS — D689 Coagulation defect, unspecified: Secondary | ICD-10-CM | POA: Diagnosis not present

## 2020-11-21 DIAGNOSIS — N2581 Secondary hyperparathyroidism of renal origin: Secondary | ICD-10-CM | POA: Diagnosis not present

## 2020-11-21 DIAGNOSIS — D509 Iron deficiency anemia, unspecified: Secondary | ICD-10-CM | POA: Diagnosis not present

## 2020-11-22 ENCOUNTER — Telehealth: Payer: Self-pay | Admitting: Family Medicine

## 2020-11-22 ENCOUNTER — Ambulatory Visit (INDEPENDENT_AMBULATORY_CARE_PROVIDER_SITE_OTHER): Payer: Self-pay

## 2020-11-22 DIAGNOSIS — M1990 Unspecified osteoarthritis, unspecified site: Secondary | ICD-10-CM | POA: Diagnosis not present

## 2020-11-22 DIAGNOSIS — N186 End stage renal disease: Secondary | ICD-10-CM | POA: Diagnosis not present

## 2020-11-22 DIAGNOSIS — E1122 Type 2 diabetes mellitus with diabetic chronic kidney disease: Secondary | ICD-10-CM

## 2020-11-22 DIAGNOSIS — Z9181 History of falling: Secondary | ICD-10-CM | POA: Diagnosis not present

## 2020-11-22 DIAGNOSIS — I4891 Unspecified atrial fibrillation: Secondary | ICD-10-CM | POA: Diagnosis not present

## 2020-11-22 DIAGNOSIS — G3184 Mild cognitive impairment, so stated: Secondary | ICD-10-CM | POA: Diagnosis not present

## 2020-11-22 DIAGNOSIS — E785 Hyperlipidemia, unspecified: Secondary | ICD-10-CM

## 2020-11-22 NOTE — Telephone Encounter (Signed)
Ok to give verbal order and I believe he was just recently seen for an appt so that should qualify

## 2020-11-22 NOTE — Patient Instructions (Signed)
Tyler Hahn,  Thank you for talking with me today. I have included our care plan/goals in the following pages.   Please review and call me at [phone] with any questions.  Thanks! Ellin Mayhew, Pharm.D., BCGP Clinical Pharmacist [facility] [place] [phone] Patient Care Plan: [place] Plan    Problem Identified: DMII ESRD CHF (systolic) Hypothyroidism Memory loss   Priority: High    Long-Range Goal: Disease Management   Start Date: 11/22/2020  Expected End Date: 11/22/2021  This Visit's Progress: On track  Priority: High  Note:   Current Barriers:  . elevated cholesterol 10/2020 labs  Pharmacist Clinical Goal(s):  Marland Kitchen Patient will contact provider office for questions/concerns as evidenced notation of same in electronic health record through collaboration with PharmD and provider.   Interventions: . 1:1 collaboration with [name], MD regarding development and update of comprehensive plan of care as evidenced by provider attestation and co-signature . Inter-disciplinary care team collaboration (see longitudinal plan of care) . Comprehensive medication review performed; medication list updated in electronic medical record . No Rx Changes  Hyperlipidemia: (LDL goal < 70) -Not ideally controlled  -Cholesterol elevated 10/16/20 otherwise has been well controlled. Possibly due to some inconsistencies in medication administration while patients son was out of town. Atorvastatin poorly dialyzable / unlikely to contribute.  -Current treatment: . Atorvastatin 20 mg once daily  -Current exercise habits: able to get out and go shopping or for walks with son without issue  -Current meal patterns: consistent meal pattern including post dialysis. No GI issues following donepezil start per son.  -Educated on Cholesterol goals;  consistent administration  -Counseled on diet and exercise extensively Recommended to  continue current medication  Diabetes (A1c goal <7%) -Controlled -ESRD - Thrice weekly incenter HD -Current medications: . No current medications  -Current home glucose readings: not routinely testing due to well controlled. . fasting glucose: n/a . post prandial glucose: n/a -Denies hypoglycemic/hyperglycemic symptoms -Educated on A1c and blood sugar goals; -Counseled to check feet daily and get yearly eye exams -Recommended to continue current medication  Hyperphosphatemia (goal: ensure tx tolerability and affordability) -Controlled -Current medications:  Aurxyia 1 gram three times daily (New) -Reviewed tolerability - no problems. Son denies price being an issue -Does not appear to be enrolled in patient assistance program -Recommended to continue current medication and reach back out if cost becomes a concern     The patient verbalized understanding of instructions provided today and agreed to receive a mychart copy of patient instruction and/or educational materials. Telephone follow up appointment with pharmacy team member scheduled for: See next appointment with "Care Management Staff" under "What's Next" below.

## 2020-11-22 NOTE — Progress Notes (Signed)
Chronic Care Management Pharmacy Note  11/22/2020 Name:  Tyler Hahn MRN:  [id number] DOB:  1941-05-31  Recommendations/Changes made from today's visit: None.   Subjective: Tyler Hahn is an 80 y.o. year old male who is a primary patient of Tabori, Aundra Millet, MD.  The CCM team was consulted for assistance with disease management and care coordination needs.    Engaged with patient by telephone for follow up visit in response to provider referral for pharmacy case management and/or care coordination services. Spoke with patient's son, [name].    Consent to Services:  The patient was given information about Chronic Care Management services, agreed to services, and gave verbal consent prior to initiation of services.  Please see initial visit note for detailed documentation.   Patient Care Team: [name], MD as PCP - General [name], MD as Consulting Physician (Cardiology) Renato Shin, MD as Consulting Physician (Endocrinology) Ladene Artist, MD as Consulting Physician (Gastroenterology) Rosita Fire, MD as Consulting Physician (Nephrology) [name], [facility] as Pharmacist (Pharmacist)  Objective:  Lab Results  Component Value Date   CREATININE 6.94 [facility]) 10/16/2020   CREATININE 4.79 (H) 09/20/2020   CREATININE 2.63 (H) 09/11/2019    Lab Results  Component Value Date   HGBA1C 6.0 10/16/2020   Last diabetic Eye exam:  Lab Results  Component Value Date/Time   HMDIABEYEEXA No Retinopathy 08/25/2016 12:00 AM    Last diabetic Foot exam: No results found for: HMDIABFOOTEX      Component Value Date/Time   CHOL 208 (H) 10/16/2020 0949   TRIG 175.0 (H) 10/16/2020 0949   HDL 39.60 10/16/2020 0949   CHOLHDL 5 10/16/2020 0949   VLDL 35.0 10/16/2020 0949   LDLCALC 134 (H) 10/16/2020 0949   LDLCALC 83 07/04/2017 1555   LDLDIRECT 97.0 06/18/2018 1531    Hepatic Function Latest Ref Rng & Units 10/16/2020 09/11/2019 09/03/2019  Total  Protein 6.0 - 8.3 g/dL 7.1 6.7 -  Albumin 3.5 - 5.2 g/dL 4.1 3.1(L) 3.1(L)  AST 0 - 37 U/L 15 33 -  ALT 0 - 53 U/L 13 18 -  Alk Phosphatase 39 - 117 U/L 69 45 -  Total Bilirubin 0.2 - 1.2 mg/dL 0.5 0.8 -  Bilirubin, Direct 0.0 - 0.3 mg/dL 0.1 - -    Lab Results  Component Value Date/Time   TSH 3.32 10/16/2020 09:49 AM   TSH 4.183 08/30/2019 04:02 PM   TSH 2.91 08/16/2019 02:24 PM   FREET4 0.91 08/30/2019 04:02 PM   FREET4 0.68 09/18/2015 01:28 PM    CBC Latest Ref Rng & Units 10/16/2020 09/20/2020 09/11/2019  WBC 4.0 - 10.5 K/uL 4.1 5.1 7.0  Hemoglobin 13.0 - 17.0 g/dL 11.5(L) 12.2(L) 8.1(L)  Hematocrit 39.0 - 52.0 % 36.0(L) 37.7 25.8(L)  Platelets 150.0 - 400.0 K/uL 98.0(L) 109(L) 147(L)    Lab Results  Component Value Date/Time   VD25OH 80.44 03/24/2014 03:41 PM   VD25OH 10 (L) 07/07/2013 02:17 PM    Clinical ASCVD:  The 10-year ASCVD risk score Mikey Bussing DC Jr., et al., 2013) is: 17%   Values used to calculate the score:     Age: 84 years     Sex: Male     Is Non-Hispanic African American: Yes     Diabetic: Yes     Tobacco smoker: No     Systolic Blood Pressure: 92 mmHg     Is BP treated: No     HDL Cholesterol: 39.6 mg/dL  Total Cholesterol: 208 mg/dL    Other: (CHADS2VASc if Afib, PHQ9 if depression, MMRC or CAT for COPD, ACT, DEXA)  Social History   Tobacco Use  Smoking Status Former Smoker  . Types: Cigarettes  . Quit date: 11/26/1998  . Years since quitting: 22.0  Smokeless Tobacco Never Used   BP Readings from Last 3 Encounters:  11/06/20 92/75  10/16/20 112/68  10/16/20 112/68   Pulse Readings from Last 3 Encounters:  11/06/20 71  10/16/20 71  10/16/20 71   Wt Readings from Last 3 Encounters:  11/06/20 179 lb (81.2 kg)  10/16/20 176 lb (79.8 kg)  10/16/20 176 lb 3.2 oz (79.9 kg)    Assessment: Review of patient past medical history, allergies, medications, health status, including review of consultants reports, laboratory and other test data,  was performed as part of comprehensive evaluation and provision of chronic care management services.   SDOH:  (Social Determinants of Health) assessments and interventions performed:    CCM Care Plan  Allergies  Allergen Reactions  . Sulfa Antibiotics Itching and Rash  . Sulfonamide Derivatives Itching and Rash    Medications Reviewed Today    Reviewed by Doneen Poisson, RN (Registered Nurse) on 11/06/20 at [phone]  Med List Status: Complete  Medication Order Taking? Sig Documenting Provider Last Dose Status Informant  allopurinol (ZYLOPRIM) 100 MG tablet [id number]  TAKE 1 TABLET BY MOUTH EVERY DAY [name], MD  Active   atorvastatin (LIPITOR) 20 MG tablet [id number] Yes TAKE 1 TABLET BY MOUTH EVERY DAY  Patient taking differently: Take 20 mg by mouth daily.   [name], MD 11/05/2020 Unknown time Active Self  AURYXIA 1 GM 210 MG(Fe) tablet [id number] Yes Take 210 mg by mouth 3 (three) times daily. [provider] 11/05/2020 Unknown time Active Self           Med Note Gentry Roch   Fri Oct 27, 2020 12:24 PM) unsure  donepezil (ARICEPT) 5 MG tablet [id number] Yes Take 1 tablet (5 mg total) by mouth at bedtime. [name], MD 11/05/2020 Unknown time Active Self  levothyroxine (SYNTHROID) 25 MCG tablet [id number] Yes TAKE 1 TABLET BY MOUTH EVERY DAY BEFORE BREAKFAST [name], MD 11/05/2020 Unknown time Active   lidocaine-prilocaine (EMLA) cream [id number] Yes Apply 1 application topically once. [provider]  Active Self  Multiple Vitamins-Minerals (MULTIVITAMIN WITH MINERALS) tablet [id number] Yes Take 1 tablet by mouth daily. [provider] 11/05/2020 Unknown time Active Self  OVER THE COUNTER MEDICATION [id number] Yes Take 1 capsule by mouth daily. Brain Food [provider] 11/05/2020 Unknown time Active Self  sertraline (ZOLOFT) 100 MG tablet [id number] Yes TAKE 1 TABLET BY MOUTH EVERY DAY  Patient taking  differently: Take 100 mg by mouth daily.   [name], MD 11/05/2020 Unknown time Active Self          Patient Active Problem List   Diagnosis Date Noted  . Memory loss 10/16/2020  . 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 Weakness   . ESRD (end stage renal disease) ([place]) 08/30/2019  . History of anemia due to CKD 08/18/2019  . Hyperkalemia 08/17/2019  . Hypothyroid 08/16/2019  . Physical exam 02/18/2019  . Greater trochanteric bursitis of left hip 08/04/2018  . Osteoarthritis 09/25/2016  . Screen for colon cancer   . Benign neoplasm of transverse colon   . Edema 07/28/2014  . Adjustment disorder with depressed mood 03/24/2014  . Small bowel  mass 12/20/2013  . Myalgia 07/07/2013  . Loss of weight 04/19/2013  . Polyarthralgia 04/19/2013  . Loss of appetite 04/19/2013  . 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 SYSTOLIC HEART FAILURE, CHRONIC 06/07/2010  . TESTICULAR MASS 04/04/2010  . 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[place]) 03/07/2010  . Hyperlipidemia 03/07/2010  . GOUT 03/07/2010  . Essential hypertension 03/07/2010  . IMPLANTABLE CARDIAC DEFIBRILLATOR -[employer]-SINGLE 03/07/2010    Immunization History  Administered Date(s) Administered  . Fluad Quad(high Dose 65+) 02/18/2019, 02/16/2020  . Influenza Whole 04/04/2010, 06/17/2012  . Influenza, High Dose Seasonal PF 03/18/2018  . Influenza,inj,Quad PF,6+ Mos 03/24/2014, 05/18/2015, 07/04/2017  . Moderna Sars-Covid-2 Vaccination 07/20/2019, 08/21/2019  . Pneumococcal Conjugate-13 02/14/2015  . Pneumococcal Polysaccharide-23 08/15/2011    Conditions to be addressed/monitored: DMII ESRD CHF (systolic) Hypothyroidism Memory loss   Care Plan : [place]  Updates made by [name], [facility] since 11/22/2020 12:00 AM    Problem: DMII ESRD CHF (systolic) Hypothyroidism Memory loss   Priority: High     Long-Range Goal: Disease Management   Start Date: 11/22/2020  Expected End Date: 11/22/2021  This Visit's Progress: On track  Priority: High  Note:   Current Barriers:  . elevated cholesterol 10/2020 labs  Pharmacist Clinical Goal(s):  Marland Kitchen Patient will contact provider office for questions/concerns as evidenced notation of same in electronic health record through collaboration with PharmD and provider.   Interventions: . 1:1 collaboration with [name], MD regarding development and update of comprehensive plan of care as evidenced by provider attestation and co-signature . Inter-disciplinary care team collaboration (see longitudinal plan of care) . Comprehensive medication review performed; medication list updated in electronic medical record . No Rx Changes  Hyperlipidemia: (LDL goal < 70) -Not ideally controlled  -Cholesterol elevated 10/16/20 otherwise has been well controlled. Possibly due to some inconsistencies in medication administration while patients son was out of town. Atorvastatin IHD dialyzability poor / unlikely to contribute.  -Current treatment: . Atorvastatin 20 mg once daily  -Current exercise habits: able to get out and go shopping or for walks with son without issue  -Current meal patterns: consistent meal pattern including post dialysis. No GI issues following donepezil start per son.  -Educated on Cholesterol goals;  consistent administration  -Counseled on diet and exercise extensively Recommended to continue current medication  Diabetes (A1c goal <7%) -Controlled -ESRD - Thrice weekly incenter HD -Current medications: . No current medications  -Current home glucose readings: not routinely testing due to well controlled. . fasting glucose: n/a . post prandial glucose: n/a -Denies hypoglycemic/hyperglycemic symptoms -Educated on A1c and blood sugar goals; -Counseled to check feet daily and get yearly eye exams -Recommended to continue current  medication  Hyperphosphatemia (goal: ensure tx tolerability and affordability) -Controlled -Current medications:  Aurxyia 1 gram three times daily (New) -Reviewed tolerability - no problems. Son denies price being an issue -Does not appear to be enrolled in patient assistance program -Recommended to continue current medication and reach back out if cost becomes a concern    Patient Goals/Self-Care Activities . Patient will:  - take medications as prescribed  Medication Assistance: None required.  Patient affirms current coverage meets needs. Patient's preferred pharmacy is:  [id number] [place], [place] - [address] AT [facility] [address] [place] [address] Phone: [phone] Fax: [phone]  [id number] [place] [address] Phone: [phone] Fax: [phone]  Uses pill box? Yes  Follow Up:  Patient agrees to Care Plan and Follow-up.  Plan: 6 month rph f/u telephone call or sooner if needed  Future Appointments  Date Time Provider [place]  02/06/2021  7:00 AM [facility] DEVICE REMOTES [facility]  03/14/2021  1:45 PM [name], MD [facility]  05/08/2021  7:00 AM [facility] DEVICE REMOTES [facility]  05/23/2021 10:30 AM [facility] CCM PHARMACIST [facility]  08/07/2021  7:00 AM [facility] DEVICE REMOTES [facility]  11/06/2021  7:00 AM [facility] DEVICE REMOTES [facility]  02/05/2022  7:00 AM [facility] DEVICE REMOTES [facility]  05/07/2022  7:00 AM [facility] DEVICE REMOTES [facility]  [name], PharmD, CPP Clinical Pharmacist Practitioner  [facility]  [phone]

## 2020-11-22 NOTE — Telephone Encounter (Signed)
Arbie Cookey from [facility] called requesting a verbal order for the following. OT/PT skilled, SW,ST dx: cognitive impairment. Also PTeval for balance and strength.  Therapy is needed for 1x a week for 4 weeks and a F2F w/in last 30 days.

## 2020-11-22 NOTE — Telephone Encounter (Signed)
..  Home Health Verbal Orders  Agency:  [facility]  Caller:Carol Contact and title   Requesting OT/ PT/ Skilled nursing/ Social Work/ Speech:  - speech therapy Reason for Request:  Cognitive Impairment Frequency:  1 x week for 4 weeks   Also requesting a PT Evaluation for balance and strenth  HH needs F2F w/in last 30 days

## 2020-11-22 NOTE — Telephone Encounter (Signed)
Called and gave Arbie Cookey with [facility] verbal orders for therapy for patient. Arbie Cookey voiced understanding. No further concerns at this time.

## 2020-11-23 DIAGNOSIS — N2581 Secondary hyperparathyroidism of renal origin: Secondary | ICD-10-CM | POA: Diagnosis not present

## 2020-11-23 DIAGNOSIS — N186 End stage renal disease: Secondary | ICD-10-CM | POA: Diagnosis not present

## 2020-11-23 DIAGNOSIS — D509 Iron deficiency anemia, unspecified: Secondary | ICD-10-CM | POA: Diagnosis not present

## 2020-11-23 DIAGNOSIS — E1122 Type 2 diabetes mellitus with diabetic chronic kidney disease: Secondary | ICD-10-CM | POA: Diagnosis not present

## 2020-11-23 DIAGNOSIS — Z992 Dependence on renal dialysis: Secondary | ICD-10-CM | POA: Diagnosis not present

## 2020-11-23 DIAGNOSIS — D689 Coagulation defect, unspecified: Secondary | ICD-10-CM | POA: Diagnosis not present

## 2020-11-24 ENCOUNTER — Ambulatory Visit (INDEPENDENT_AMBULATORY_CARE_PROVIDER_SITE_OTHER): Payer: Medicare HMO | Admitting: Family Medicine

## 2020-11-24 ENCOUNTER — Encounter: Payer: Self-pay | Admitting: Family Medicine

## 2020-11-24 ENCOUNTER — Other Ambulatory Visit: Payer: Self-pay

## 2020-11-24 VITALS — BP 100/60 | HR 73 | Temp 98.2°F | Resp 18 | Ht 67.0 in | Wt 172.6 lb

## 2020-11-24 DIAGNOSIS — E1122 Type 2 diabetes mellitus with diabetic chronic kidney disease: Secondary | ICD-10-CM

## 2020-11-24 DIAGNOSIS — R413 Other amnesia: Secondary | ICD-10-CM

## 2020-11-24 DIAGNOSIS — N186 End stage renal disease: Secondary | ICD-10-CM | POA: Diagnosis not present

## 2020-11-24 NOTE — Patient Instructions (Addendum)
Schedule your complete physical for September No need for labs today!!! Continue the Aricept daily Call with any questions or concerns Stay Safe!  Stay Healthy! Have a great summer!!!

## 2020-11-24 NOTE — Progress Notes (Signed)
   Subjective:    Patient ID: Tyler Hahn, male    DOB: Nov 11, 1940, 80 y.o.   MRN: [id number]  HPI Memory loss- pt was started on Aricept 5mg  at last OV.  There were obvious memory issues after he had his defibrillator replaced.  Things have improved in the last week or so.  Tyler Hahn was concerned about him 'getting his days mixed up'.  Son feels Aricept is helping.  Confusion after HD has improved.  DM- due for foot exam, eye exam.  Eye exam is upcoming.  No sores or blisters on feet.  Pt denies numbness/tingling of hands/feet.   Review of Systems For ROS see HPI  This visit occurred during the SARS-CoV-2 public health emergency.  Safety protocols were in place, including screening questions prior to the visit, additional usage of staff PPE, and extensive cleaning of exam room while observing appropriate contact time as indicated for disinfecting solutions.      Objective:   Physical Exam Vitals reviewed.  Constitutional:      General: He is not in acute distress.    Appearance: Normal appearance. He is not ill-appearing.  HENT:     Head: Normocephalic and atraumatic.  Cardiovascular:     Pulses: Normal pulses.  Musculoskeletal:     Right lower leg: Edema (trace LE edema) present.     Left lower leg: Edema (trace LE edema) present.  Skin:    General: Skin is warm and dry.  Neurological:     Mental Status: He is alert and oriented to person, place, and time. Mental status is at baseline.  Psychiatric:     Comments: Much more interactive today- laughing, joking, answering questions          Assessment & Plan:

## 2020-11-24 NOTE — Assessment & Plan Note (Signed)
Son to schedule eye exam.  Foot exam done today.

## 2020-11-24 NOTE — Assessment & Plan Note (Signed)
Son reports sxs acutely worsened after defibrillator replacement- likely due to anesthesia.  Thankfully he is back to his baseline and actually is doing better after his HD sessions.  No longer having confusion after his treatments.  Son feels Aricept has been helpful and pt does seem much more like his old self today- laughing, joking, answering questions.  Will continue to follow.

## 2020-11-25 DIAGNOSIS — Z992 Dependence on renal dialysis: Secondary | ICD-10-CM | POA: Diagnosis not present

## 2020-11-25 DIAGNOSIS — E1122 Type 2 diabetes mellitus with diabetic chronic kidney disease: Secondary | ICD-10-CM | POA: Diagnosis not present

## 2020-11-25 DIAGNOSIS — D689 Coagulation defect, unspecified: Secondary | ICD-10-CM | POA: Diagnosis not present

## 2020-11-25 DIAGNOSIS — N186 End stage renal disease: Secondary | ICD-10-CM | POA: Diagnosis not present

## 2020-11-25 DIAGNOSIS — D509 Iron deficiency anemia, unspecified: Secondary | ICD-10-CM | POA: Diagnosis not present

## 2020-11-25 DIAGNOSIS — N2581 Secondary hyperparathyroidism of renal origin: Secondary | ICD-10-CM | POA: Diagnosis not present

## 2020-11-28 DIAGNOSIS — D509 Iron deficiency anemia, unspecified: Secondary | ICD-10-CM | POA: Diagnosis not present

## 2020-11-28 DIAGNOSIS — N2581 Secondary hyperparathyroidism of renal origin: Secondary | ICD-10-CM | POA: Diagnosis not present

## 2020-11-28 DIAGNOSIS — N186 End stage renal disease: Secondary | ICD-10-CM | POA: Diagnosis not present

## 2020-11-28 DIAGNOSIS — D689 Coagulation defect, unspecified: Secondary | ICD-10-CM | POA: Diagnosis not present

## 2020-11-28 DIAGNOSIS — Z992 Dependence on renal dialysis: Secondary | ICD-10-CM | POA: Diagnosis not present

## 2020-11-29 DIAGNOSIS — E1122 Type 2 diabetes mellitus with diabetic chronic kidney disease: Secondary | ICD-10-CM | POA: Diagnosis not present

## 2020-11-29 DIAGNOSIS — I4891 Unspecified atrial fibrillation: Secondary | ICD-10-CM | POA: Diagnosis not present

## 2020-11-29 DIAGNOSIS — M1990 Unspecified osteoarthritis, unspecified site: Secondary | ICD-10-CM | POA: Diagnosis not present

## 2020-11-29 DIAGNOSIS — Z9181 History of falling: Secondary | ICD-10-CM | POA: Diagnosis not present

## 2020-11-29 DIAGNOSIS — G3184 Mild cognitive impairment, so stated: Secondary | ICD-10-CM | POA: Diagnosis not present

## 2020-11-29 DIAGNOSIS — N186 End stage renal disease: Secondary | ICD-10-CM | POA: Diagnosis not present

## 2020-11-30 DIAGNOSIS — Z992 Dependence on renal dialysis: Secondary | ICD-10-CM | POA: Diagnosis not present

## 2020-11-30 DIAGNOSIS — N2581 Secondary hyperparathyroidism of renal origin: Secondary | ICD-10-CM | POA: Diagnosis not present

## 2020-11-30 DIAGNOSIS — N186 End stage renal disease: Secondary | ICD-10-CM | POA: Diagnosis not present

## 2020-11-30 DIAGNOSIS — D689 Coagulation defect, unspecified: Secondary | ICD-10-CM | POA: Diagnosis not present

## 2020-11-30 DIAGNOSIS — D509 Iron deficiency anemia, unspecified: Secondary | ICD-10-CM | POA: Diagnosis not present

## 2020-12-01 ENCOUNTER — Telehealth: Payer: Self-pay | Admitting: Family Medicine

## 2020-12-01 DIAGNOSIS — I4891 Unspecified atrial fibrillation: Secondary | ICD-10-CM | POA: Diagnosis not present

## 2020-12-01 DIAGNOSIS — E1122 Type 2 diabetes mellitus with diabetic chronic kidney disease: Secondary | ICD-10-CM | POA: Diagnosis not present

## 2020-12-01 DIAGNOSIS — N186 End stage renal disease: Secondary | ICD-10-CM | POA: Diagnosis not present

## 2020-12-01 DIAGNOSIS — G3184 Mild cognitive impairment, so stated: Secondary | ICD-10-CM | POA: Diagnosis not present

## 2020-12-01 DIAGNOSIS — Z9181 History of falling: Secondary | ICD-10-CM | POA: Diagnosis not present

## 2020-12-01 DIAGNOSIS — M1990 Unspecified osteoarthritis, unspecified site: Secondary | ICD-10-CM | POA: Diagnosis not present

## 2020-12-01 NOTE — Telephone Encounter (Signed)
Verbal orders given  

## 2020-12-01 NOTE — Telephone Encounter (Signed)
OK verbal orders given

## 2020-12-01 NOTE — Telephone Encounter (Signed)
..  Home Health Verbal Orders  Agency:  [facility] Caller:Tyler Contact and title   Requesting OT/ PT/ Skilled nursing/ Social Work/ Speech:  Physical therapy   Reason for Request:  Balance and strengthening  Frequency:  1 time weekly for 4 weeks   HH needs F2F w/in last 30 days    He said that you can leave a message on his voicemail if he does not answer

## 2020-12-01 NOTE — Telephone Encounter (Signed)
Ok for verbal orders ?

## 2020-12-02 DIAGNOSIS — Z992 Dependence on renal dialysis: Secondary | ICD-10-CM | POA: Diagnosis not present

## 2020-12-02 DIAGNOSIS — D509 Iron deficiency anemia, unspecified: Secondary | ICD-10-CM | POA: Diagnosis not present

## 2020-12-02 DIAGNOSIS — D689 Coagulation defect, unspecified: Secondary | ICD-10-CM | POA: Diagnosis not present

## 2020-12-02 DIAGNOSIS — N186 End stage renal disease: Secondary | ICD-10-CM | POA: Diagnosis not present

## 2020-12-02 DIAGNOSIS — N2581 Secondary hyperparathyroidism of renal origin: Secondary | ICD-10-CM | POA: Diagnosis not present

## 2020-12-05 DIAGNOSIS — D509 Iron deficiency anemia, unspecified: Secondary | ICD-10-CM | POA: Diagnosis not present

## 2020-12-05 DIAGNOSIS — N186 End stage renal disease: Secondary | ICD-10-CM | POA: Diagnosis not present

## 2020-12-05 DIAGNOSIS — N2581 Secondary hyperparathyroidism of renal origin: Secondary | ICD-10-CM | POA: Diagnosis not present

## 2020-12-05 DIAGNOSIS — Z992 Dependence on renal dialysis: Secondary | ICD-10-CM | POA: Diagnosis not present

## 2020-12-05 DIAGNOSIS — D689 Coagulation defect, unspecified: Secondary | ICD-10-CM | POA: Diagnosis not present

## 2020-12-06 ENCOUNTER — Telehealth: Payer: Self-pay | Admitting: Family Medicine

## 2020-12-06 DIAGNOSIS — Z9181 History of falling: Secondary | ICD-10-CM | POA: Diagnosis not present

## 2020-12-06 DIAGNOSIS — M1990 Unspecified osteoarthritis, unspecified site: Secondary | ICD-10-CM | POA: Diagnosis not present

## 2020-12-06 DIAGNOSIS — N186 End stage renal disease: Secondary | ICD-10-CM | POA: Diagnosis not present

## 2020-12-06 DIAGNOSIS — E1122 Type 2 diabetes mellitus with diabetic chronic kidney disease: Secondary | ICD-10-CM | POA: Diagnosis not present

## 2020-12-06 DIAGNOSIS — I4891 Unspecified atrial fibrillation: Secondary | ICD-10-CM | POA: Diagnosis not present

## 2020-12-06 DIAGNOSIS — G3184 Mild cognitive impairment, so stated: Secondary | ICD-10-CM | POA: Diagnosis not present

## 2020-12-06 NOTE — Telephone Encounter (Signed)
Ok to call back with verbal order?

## 2020-12-06 NOTE — Telephone Encounter (Signed)
Tyler Hahn from [facility] called in asking for a verbal to use Dx code G31.84, she states they can't use the dx code for dementia.  Ok to [facility] if no answer with the ok

## 2020-12-07 DIAGNOSIS — D509 Iron deficiency anemia, unspecified: Secondary | ICD-10-CM | POA: Diagnosis not present

## 2020-12-07 DIAGNOSIS — N186 End stage renal disease: Secondary | ICD-10-CM | POA: Diagnosis not present

## 2020-12-07 DIAGNOSIS — N2581 Secondary hyperparathyroidism of renal origin: Secondary | ICD-10-CM | POA: Diagnosis not present

## 2020-12-07 DIAGNOSIS — D689 Coagulation defect, unspecified: Secondary | ICD-10-CM | POA: Diagnosis not present

## 2020-12-07 DIAGNOSIS — Z992 Dependence on renal dialysis: Secondary | ICD-10-CM | POA: Diagnosis not present

## 2020-12-07 NOTE — Telephone Encounter (Signed)
[place] for verbal order to change dx code

## 2020-12-07 NOTE — Telephone Encounter (Signed)
Spoke with Arbie Cookey to give verbal order to change dx code. She voiced understanding.

## 2020-12-08 DIAGNOSIS — I4891 Unspecified atrial fibrillation: Secondary | ICD-10-CM | POA: Diagnosis not present

## 2020-12-08 DIAGNOSIS — M1990 Unspecified osteoarthritis, unspecified site: Secondary | ICD-10-CM | POA: Diagnosis not present

## 2020-12-08 DIAGNOSIS — E1122 Type 2 diabetes mellitus with diabetic chronic kidney disease: Secondary | ICD-10-CM | POA: Diagnosis not present

## 2020-12-08 DIAGNOSIS — G3184 Mild cognitive impairment, so stated: Secondary | ICD-10-CM | POA: Diagnosis not present

## 2020-12-08 DIAGNOSIS — Z9181 History of falling: Secondary | ICD-10-CM | POA: Diagnosis not present

## 2020-12-08 DIAGNOSIS — N186 End stage renal disease: Secondary | ICD-10-CM | POA: Diagnosis not present

## 2020-12-09 DIAGNOSIS — Z992 Dependence on renal dialysis: Secondary | ICD-10-CM | POA: Diagnosis not present

## 2020-12-09 DIAGNOSIS — D689 Coagulation defect, unspecified: Secondary | ICD-10-CM | POA: Diagnosis not present

## 2020-12-09 DIAGNOSIS — N2581 Secondary hyperparathyroidism of renal origin: Secondary | ICD-10-CM | POA: Diagnosis not present

## 2020-12-09 DIAGNOSIS — N186 End stage renal disease: Secondary | ICD-10-CM | POA: Diagnosis not present

## 2020-12-09 DIAGNOSIS — D509 Iron deficiency anemia, unspecified: Secondary | ICD-10-CM | POA: Diagnosis not present

## 2020-12-11 DIAGNOSIS — Z9181 History of falling: Secondary | ICD-10-CM | POA: Diagnosis not present

## 2020-12-11 DIAGNOSIS — N186 End stage renal disease: Secondary | ICD-10-CM | POA: Diagnosis not present

## 2020-12-11 DIAGNOSIS — I4891 Unspecified atrial fibrillation: Secondary | ICD-10-CM | POA: Diagnosis not present

## 2020-12-11 DIAGNOSIS — M1990 Unspecified osteoarthritis, unspecified site: Secondary | ICD-10-CM | POA: Diagnosis not present

## 2020-12-11 DIAGNOSIS — G3184 Mild cognitive impairment, so stated: Secondary | ICD-10-CM | POA: Diagnosis not present

## 2020-12-11 DIAGNOSIS — E1122 Type 2 diabetes mellitus with diabetic chronic kidney disease: Secondary | ICD-10-CM | POA: Diagnosis not present

## 2020-12-12 ENCOUNTER — Telehealth: Payer: Self-pay | Admitting: Family Medicine

## 2020-12-12 DIAGNOSIS — N186 End stage renal disease: Secondary | ICD-10-CM | POA: Diagnosis not present

## 2020-12-12 DIAGNOSIS — Z992 Dependence on renal dialysis: Secondary | ICD-10-CM | POA: Diagnosis not present

## 2020-12-12 DIAGNOSIS — D689 Coagulation defect, unspecified: Secondary | ICD-10-CM | POA: Diagnosis not present

## 2020-12-12 DIAGNOSIS — D509 Iron deficiency anemia, unspecified: Secondary | ICD-10-CM | POA: Diagnosis not present

## 2020-12-12 DIAGNOSIS — N2581 Secondary hyperparathyroidism of renal origin: Secondary | ICD-10-CM | POA: Diagnosis not present

## 2020-12-12 NOTE — Telephone Encounter (Signed)
Encounter made in error. 

## 2020-12-12 NOTE — Telephone Encounter (Signed)
..  Home Health Certification or Plan of Care Tracking  Is this a Certification or Plan of Care?  Yes  [place] Agency:  [name]  Order Number:  [phone]  Has charge sheet been attached? Yes  Where has form been placed:   Up front in Dr. Virgil Benedict bin

## 2020-12-13 ENCOUNTER — Encounter: Payer: Self-pay | Admitting: *Deleted

## 2020-12-13 DIAGNOSIS — I4891 Unspecified atrial fibrillation: Secondary | ICD-10-CM

## 2020-12-13 DIAGNOSIS — Z9181 History of falling: Secondary | ICD-10-CM

## 2020-12-13 DIAGNOSIS — N186 End stage renal disease: Secondary | ICD-10-CM | POA: Diagnosis not present

## 2020-12-13 DIAGNOSIS — E1122 Type 2 diabetes mellitus with diabetic chronic kidney disease: Secondary | ICD-10-CM | POA: Diagnosis not present

## 2020-12-13 DIAGNOSIS — M1990 Unspecified osteoarthritis, unspecified site: Secondary | ICD-10-CM | POA: Diagnosis not present

## 2020-12-13 DIAGNOSIS — G3184 Mild cognitive impairment, so stated: Secondary | ICD-10-CM | POA: Diagnosis not present

## 2020-12-13 NOTE — Telephone Encounter (Signed)
Picked up from the back faxed back to [facility] and have sent to scan and charge entry.

## 2020-12-14 ENCOUNTER — Telehealth: Payer: Self-pay

## 2020-12-14 DIAGNOSIS — D689 Coagulation defect, unspecified: Secondary | ICD-10-CM | POA: Diagnosis not present

## 2020-12-14 DIAGNOSIS — Z992 Dependence on renal dialysis: Secondary | ICD-10-CM | POA: Diagnosis not present

## 2020-12-14 DIAGNOSIS — N2581 Secondary hyperparathyroidism of renal origin: Secondary | ICD-10-CM | POA: Diagnosis not present

## 2020-12-14 DIAGNOSIS — D509 Iron deficiency anemia, unspecified: Secondary | ICD-10-CM | POA: Diagnosis not present

## 2020-12-14 DIAGNOSIS — I129 Hypertensive chronic kidney disease with stage 1 through stage 4 chronic kidney disease, or unspecified chronic kidney disease: Secondary | ICD-10-CM | POA: Diagnosis not present

## 2020-12-14 DIAGNOSIS — N186 End stage renal disease: Secondary | ICD-10-CM | POA: Diagnosis not present

## 2020-12-14 NOTE — Chronic Care Management (AMB) (Signed)
    Chronic Care Management Pharmacy Assistant   Name: Tyler Hahn  MRN: [id number] DOB: 1941-02-07  Reason for Encounter: Chart Review  Medications: Outpatient Encounter Medications as of 12/14/2020  Medication Sig Note   allopurinol (ZYLOPRIM) 100 MG tablet TAKE 1 TABLET BY MOUTH EVERY DAY    atorvastatin (LIPITOR) 20 MG tablet TAKE 1 TABLET BY MOUTH EVERY DAY (Patient taking differently: Take 20 mg by mouth daily.)    AURYXIA 1 GM 210 MG(Fe) tablet Take 210 mg by mouth 3 (three) times daily. 10/27/2020: unsure   donepezil (ARICEPT) 5 MG tablet Take 1 tablet (5 mg total) by mouth at bedtime.    levothyroxine (SYNTHROID) 25 MCG tablet TAKE 1 TABLET BY MOUTH EVERY DAY BEFORE BREAKFAST    Multiple Vitamins-Minerals (MULTIVITAMIN WITH MINERALS) tablet Take 1 tablet by mouth daily.    OVER THE COUNTER MEDICATION Take 1 capsule by mouth daily. Brain Food    sertraline (ZOLOFT) 100 MG tablet TAKE 1 TABLET BY MOUTH EVERY DAY (Patient taking differently: Take 100 mg by mouth daily.)    No facility-administered encounter medications on file as of 12/14/2020.   Reviewed chart for medication changes and adherence.   Recent OV, Consult or Hospital visit:  11/24/20- [name], MD- seen for memory loss, no changes  No medication changes indicated  No gaps in adherence identified. Patient has follow up scheduled with pharmacy team. No further action required.  Wilford Sports CPA, CMA

## 2020-12-16 DIAGNOSIS — Z992 Dependence on renal dialysis: Secondary | ICD-10-CM | POA: Diagnosis not present

## 2020-12-16 DIAGNOSIS — D689 Coagulation defect, unspecified: Secondary | ICD-10-CM | POA: Diagnosis not present

## 2020-12-16 DIAGNOSIS — N186 End stage renal disease: Secondary | ICD-10-CM | POA: Diagnosis not present

## 2020-12-16 DIAGNOSIS — N2581 Secondary hyperparathyroidism of renal origin: Secondary | ICD-10-CM | POA: Diagnosis not present

## 2020-12-19 DIAGNOSIS — D509 Iron deficiency anemia, unspecified: Secondary | ICD-10-CM | POA: Diagnosis not present

## 2020-12-19 DIAGNOSIS — D689 Coagulation defect, unspecified: Secondary | ICD-10-CM | POA: Diagnosis not present

## 2020-12-19 DIAGNOSIS — N186 End stage renal disease: Secondary | ICD-10-CM | POA: Diagnosis not present

## 2020-12-19 DIAGNOSIS — N2581 Secondary hyperparathyroidism of renal origin: Secondary | ICD-10-CM | POA: Diagnosis not present

## 2020-12-19 DIAGNOSIS — Z992 Dependence on renal dialysis: Secondary | ICD-10-CM | POA: Diagnosis not present

## 2020-12-20 DIAGNOSIS — M1990 Unspecified osteoarthritis, unspecified site: Secondary | ICD-10-CM | POA: Diagnosis not present

## 2020-12-20 DIAGNOSIS — G3184 Mild cognitive impairment, so stated: Secondary | ICD-10-CM | POA: Diagnosis not present

## 2020-12-20 DIAGNOSIS — N186 End stage renal disease: Secondary | ICD-10-CM | POA: Diagnosis not present

## 2020-12-20 DIAGNOSIS — Z9181 History of falling: Secondary | ICD-10-CM | POA: Diagnosis not present

## 2020-12-20 DIAGNOSIS — E1122 Type 2 diabetes mellitus with diabetic chronic kidney disease: Secondary | ICD-10-CM | POA: Diagnosis not present

## 2020-12-20 DIAGNOSIS — I4891 Unspecified atrial fibrillation: Secondary | ICD-10-CM | POA: Diagnosis not present

## 2020-12-21 DIAGNOSIS — Z992 Dependence on renal dialysis: Secondary | ICD-10-CM | POA: Diagnosis not present

## 2020-12-21 DIAGNOSIS — D509 Iron deficiency anemia, unspecified: Secondary | ICD-10-CM | POA: Diagnosis not present

## 2020-12-21 DIAGNOSIS — N186 End stage renal disease: Secondary | ICD-10-CM | POA: Diagnosis not present

## 2020-12-21 DIAGNOSIS — N2581 Secondary hyperparathyroidism of renal origin: Secondary | ICD-10-CM | POA: Diagnosis not present

## 2020-12-21 DIAGNOSIS — D689 Coagulation defect, unspecified: Secondary | ICD-10-CM | POA: Diagnosis not present

## 2020-12-23 DIAGNOSIS — Z992 Dependence on renal dialysis: Secondary | ICD-10-CM | POA: Diagnosis not present

## 2020-12-23 DIAGNOSIS — N2581 Secondary hyperparathyroidism of renal origin: Secondary | ICD-10-CM | POA: Diagnosis not present

## 2020-12-23 DIAGNOSIS — D509 Iron deficiency anemia, unspecified: Secondary | ICD-10-CM | POA: Diagnosis not present

## 2020-12-23 DIAGNOSIS — N186 End stage renal disease: Secondary | ICD-10-CM | POA: Diagnosis not present

## 2020-12-23 DIAGNOSIS — D689 Coagulation defect, unspecified: Secondary | ICD-10-CM | POA: Diagnosis not present

## 2020-12-25 ENCOUNTER — Other Ambulatory Visit: Payer: Self-pay | Admitting: Family Medicine

## 2020-12-26 DIAGNOSIS — D689 Coagulation defect, unspecified: Secondary | ICD-10-CM | POA: Diagnosis not present

## 2020-12-26 DIAGNOSIS — D509 Iron deficiency anemia, unspecified: Secondary | ICD-10-CM | POA: Diagnosis not present

## 2020-12-26 DIAGNOSIS — N2581 Secondary hyperparathyroidism of renal origin: Secondary | ICD-10-CM | POA: Diagnosis not present

## 2020-12-26 DIAGNOSIS — Z992 Dependence on renal dialysis: Secondary | ICD-10-CM | POA: Diagnosis not present

## 2020-12-26 DIAGNOSIS — N186 End stage renal disease: Secondary | ICD-10-CM | POA: Diagnosis not present

## 2020-12-28 DIAGNOSIS — Z992 Dependence on renal dialysis: Secondary | ICD-10-CM | POA: Diagnosis not present

## 2020-12-28 DIAGNOSIS — N186 End stage renal disease: Secondary | ICD-10-CM | POA: Diagnosis not present

## 2020-12-28 DIAGNOSIS — D689 Coagulation defect, unspecified: Secondary | ICD-10-CM | POA: Diagnosis not present

## 2020-12-28 DIAGNOSIS — N2581 Secondary hyperparathyroidism of renal origin: Secondary | ICD-10-CM | POA: Diagnosis not present

## 2020-12-28 DIAGNOSIS — D509 Iron deficiency anemia, unspecified: Secondary | ICD-10-CM | POA: Diagnosis not present

## 2020-12-29 ENCOUNTER — Other Ambulatory Visit: Payer: Self-pay | Admitting: Family Medicine

## 2020-12-29 DIAGNOSIS — I4891 Unspecified atrial fibrillation: Secondary | ICD-10-CM | POA: Diagnosis not present

## 2020-12-29 DIAGNOSIS — Z9181 History of falling: Secondary | ICD-10-CM | POA: Diagnosis not present

## 2020-12-29 DIAGNOSIS — E1122 Type 2 diabetes mellitus with diabetic chronic kidney disease: Secondary | ICD-10-CM | POA: Diagnosis not present

## 2020-12-29 DIAGNOSIS — N186 End stage renal disease: Secondary | ICD-10-CM | POA: Diagnosis not present

## 2020-12-29 DIAGNOSIS — M1990 Unspecified osteoarthritis, unspecified site: Secondary | ICD-10-CM | POA: Diagnosis not present

## 2020-12-29 DIAGNOSIS — G3184 Mild cognitive impairment, so stated: Secondary | ICD-10-CM | POA: Diagnosis not present

## 2020-12-30 DIAGNOSIS — N186 End stage renal disease: Secondary | ICD-10-CM | POA: Diagnosis not present

## 2020-12-30 DIAGNOSIS — D509 Iron deficiency anemia, unspecified: Secondary | ICD-10-CM | POA: Diagnosis not present

## 2020-12-30 DIAGNOSIS — D689 Coagulation defect, unspecified: Secondary | ICD-10-CM | POA: Diagnosis not present

## 2020-12-30 DIAGNOSIS — N2581 Secondary hyperparathyroidism of renal origin: Secondary | ICD-10-CM | POA: Diagnosis not present

## 2020-12-30 DIAGNOSIS — Z992 Dependence on renal dialysis: Secondary | ICD-10-CM | POA: Diagnosis not present

## 2021-01-02 DIAGNOSIS — N186 End stage renal disease: Secondary | ICD-10-CM | POA: Diagnosis not present

## 2021-01-02 DIAGNOSIS — Z992 Dependence on renal dialysis: Secondary | ICD-10-CM | POA: Diagnosis not present

## 2021-01-02 DIAGNOSIS — N2581 Secondary hyperparathyroidism of renal origin: Secondary | ICD-10-CM | POA: Diagnosis not present

## 2021-01-02 DIAGNOSIS — D689 Coagulation defect, unspecified: Secondary | ICD-10-CM | POA: Diagnosis not present

## 2021-01-04 DIAGNOSIS — Z992 Dependence on renal dialysis: Secondary | ICD-10-CM | POA: Diagnosis not present

## 2021-01-04 DIAGNOSIS — D689 Coagulation defect, unspecified: Secondary | ICD-10-CM | POA: Diagnosis not present

## 2021-01-04 DIAGNOSIS — N2581 Secondary hyperparathyroidism of renal origin: Secondary | ICD-10-CM | POA: Diagnosis not present

## 2021-01-04 DIAGNOSIS — N186 End stage renal disease: Secondary | ICD-10-CM | POA: Diagnosis not present

## 2021-01-06 DIAGNOSIS — N186 End stage renal disease: Secondary | ICD-10-CM | POA: Diagnosis not present

## 2021-01-06 DIAGNOSIS — N2581 Secondary hyperparathyroidism of renal origin: Secondary | ICD-10-CM | POA: Diagnosis not present

## 2021-01-06 DIAGNOSIS — Z992 Dependence on renal dialysis: Secondary | ICD-10-CM | POA: Diagnosis not present

## 2021-01-06 DIAGNOSIS — D689 Coagulation defect, unspecified: Secondary | ICD-10-CM | POA: Diagnosis not present

## 2021-01-09 DIAGNOSIS — D689 Coagulation defect, unspecified: Secondary | ICD-10-CM | POA: Diagnosis not present

## 2021-01-09 DIAGNOSIS — N186 End stage renal disease: Secondary | ICD-10-CM | POA: Diagnosis not present

## 2021-01-09 DIAGNOSIS — N2581 Secondary hyperparathyroidism of renal origin: Secondary | ICD-10-CM | POA: Diagnosis not present

## 2021-01-09 DIAGNOSIS — Z992 Dependence on renal dialysis: Secondary | ICD-10-CM | POA: Diagnosis not present

## 2021-01-11 DIAGNOSIS — N2581 Secondary hyperparathyroidism of renal origin: Secondary | ICD-10-CM | POA: Diagnosis not present

## 2021-01-11 DIAGNOSIS — D689 Coagulation defect, unspecified: Secondary | ICD-10-CM | POA: Diagnosis not present

## 2021-01-11 DIAGNOSIS — Z992 Dependence on renal dialysis: Secondary | ICD-10-CM | POA: Diagnosis not present

## 2021-01-11 DIAGNOSIS — N186 End stage renal disease: Secondary | ICD-10-CM | POA: Diagnosis not present

## 2021-01-13 DIAGNOSIS — Z992 Dependence on renal dialysis: Secondary | ICD-10-CM | POA: Diagnosis not present

## 2021-01-13 DIAGNOSIS — N186 End stage renal disease: Secondary | ICD-10-CM | POA: Diagnosis not present

## 2021-01-13 DIAGNOSIS — N2581 Secondary hyperparathyroidism of renal origin: Secondary | ICD-10-CM | POA: Diagnosis not present

## 2021-01-13 DIAGNOSIS — D689 Coagulation defect, unspecified: Secondary | ICD-10-CM | POA: Diagnosis not present

## 2021-01-14 DIAGNOSIS — Z992 Dependence on renal dialysis: Secondary | ICD-10-CM | POA: Diagnosis not present

## 2021-01-14 DIAGNOSIS — I129 Hypertensive chronic kidney disease with stage 1 through stage 4 chronic kidney disease, or unspecified chronic kidney disease: Secondary | ICD-10-CM | POA: Diagnosis not present

## 2021-01-14 DIAGNOSIS — N186 End stage renal disease: Secondary | ICD-10-CM | POA: Diagnosis not present

## 2021-01-16 DIAGNOSIS — N186 End stage renal disease: Secondary | ICD-10-CM | POA: Diagnosis not present

## 2021-01-16 DIAGNOSIS — D689 Coagulation defect, unspecified: Secondary | ICD-10-CM | POA: Diagnosis not present

## 2021-01-16 DIAGNOSIS — Z992 Dependence on renal dialysis: Secondary | ICD-10-CM | POA: Diagnosis not present

## 2021-01-16 DIAGNOSIS — N2581 Secondary hyperparathyroidism of renal origin: Secondary | ICD-10-CM | POA: Diagnosis not present

## 2021-01-18 DIAGNOSIS — D689 Coagulation defect, unspecified: Secondary | ICD-10-CM | POA: Diagnosis not present

## 2021-01-18 DIAGNOSIS — N186 End stage renal disease: Secondary | ICD-10-CM | POA: Diagnosis not present

## 2021-01-18 DIAGNOSIS — N2581 Secondary hyperparathyroidism of renal origin: Secondary | ICD-10-CM | POA: Diagnosis not present

## 2021-01-18 DIAGNOSIS — Z992 Dependence on renal dialysis: Secondary | ICD-10-CM | POA: Diagnosis not present

## 2021-01-20 DIAGNOSIS — D689 Coagulation defect, unspecified: Secondary | ICD-10-CM | POA: Diagnosis not present

## 2021-01-20 DIAGNOSIS — N186 End stage renal disease: Secondary | ICD-10-CM | POA: Diagnosis not present

## 2021-01-20 DIAGNOSIS — N2581 Secondary hyperparathyroidism of renal origin: Secondary | ICD-10-CM | POA: Diagnosis not present

## 2021-01-20 DIAGNOSIS — Z992 Dependence on renal dialysis: Secondary | ICD-10-CM | POA: Diagnosis not present

## 2021-01-23 ENCOUNTER — Telehealth: Payer: Self-pay

## 2021-01-23 ENCOUNTER — Other Ambulatory Visit: Payer: Self-pay | Admitting: Family Medicine

## 2021-01-23 DIAGNOSIS — N186 End stage renal disease: Secondary | ICD-10-CM | POA: Diagnosis not present

## 2021-01-23 DIAGNOSIS — Z992 Dependence on renal dialysis: Secondary | ICD-10-CM | POA: Diagnosis not present

## 2021-01-23 DIAGNOSIS — N2581 Secondary hyperparathyroidism of renal origin: Secondary | ICD-10-CM | POA: Diagnosis not present

## 2021-01-23 DIAGNOSIS — D689 Coagulation defect, unspecified: Secondary | ICD-10-CM | POA: Diagnosis not present

## 2021-01-23 DIAGNOSIS — E1122 Type 2 diabetes mellitus with diabetic chronic kidney disease: Secondary | ICD-10-CM | POA: Diagnosis not present

## 2021-01-23 NOTE — Progress Notes (Signed)
    Chronic Care Management Pharmacy Assistant   Name: Tyler Hahn  MRN: [id number] DOB: [birth year]   Reason for Encounter: Disease State - General Adherence     Recent office visits:  11/24/20 [name], MD (PCP) - Family Medicine - Memory loss - No medication changes. Foot exam done in office. Follow up with physical in September.   Recent consult visits:  None noted.   Hospital visits:  Medication Reconciliation was completed by comparing discharge summary, patient's EMR and Pharmacy list, and upon discussion with patient.  Admitted to the hospital on 11/06/20 due to ICD Generator Change out.Marland Kitchen Discharge date was 11/06/20. Discharged from [facility]?Medications Started at [facility] Discharge:?? No new medications were noted.   Medication Changes at Hospital Discharge: No changes in medications noted.   Medications Discontinued at Hospital Discharge: Np medications were discontinued at discharge.   Medications that remain the same after Hospital Discharge:??  All other medications will remain the same.    Medications: Outpatient Encounter Medications as of 01/23/2021  Medication Sig Note   allopurinol (ZYLOPRIM) 100 MG tablet TAKE 1 TABLET BY MOUTH DAILY    atorvastatin (LIPITOR) 20 MG tablet TAKE 1 TABLET BY MOUTH EVERY DAY (Patient taking differently: Take 20 mg by mouth daily.)    AURYXIA 1 GM 210 MG(Fe) tablet Take 210 mg by mouth 3 (three) times daily. 10/27/2020: unsure   donepezil (ARICEPT) 5 MG tablet Take 1 tablet (5 mg total) by mouth at bedtime.    levothyroxine (SYNTHROID) 25 MCG tablet TAKE 1 TABLET BY MOUTH EVERY DAY BEFORE BREAKFAST    Multiple Vitamins-Minerals (MULTIVITAMIN WITH MINERALS) tablet Take 1 tablet by mouth daily.    OVER THE COUNTER MEDICATION Take 1 capsule by mouth daily. Brain Food    sertraline (ZOLOFT) 100 MG tablet TAKE 1 TABLET BY MOUTH EVERY DAY    No facility-administered encounter medications on file as of  01/23/2021.   Have you had any problems recently with your health? Patient's son reported no current concerns with patient's health.  Have you had any problems with your pharmacy? Patient's son reported no issues with patient's current pharmacy.  What issues or side effects are you having with your medications? Patient's son denied any side effects or issues with patient's current medications.   What would you like me to pass along to [name], CPP for them to help you with?  Patient's son did not have anything at this time he would like to discuss.  What can we do to take care of you better? Patient's son had no current suggestions and reported he was satisfied with his father's current level of care.   Star Rating Drugs: atorvastatin (LIPITOR) 20 MG tablet - last filled 01/23/21 30 days    Jobe Gibbon, [facility] Clinical Pharmacist Assistant  [phone]  Time Spent: 20 minutes

## 2021-01-25 DIAGNOSIS — N2581 Secondary hyperparathyroidism of renal origin: Secondary | ICD-10-CM | POA: Diagnosis not present

## 2021-01-25 DIAGNOSIS — D689 Coagulation defect, unspecified: Secondary | ICD-10-CM | POA: Diagnosis not present

## 2021-01-25 DIAGNOSIS — N186 End stage renal disease: Secondary | ICD-10-CM | POA: Diagnosis not present

## 2021-01-25 DIAGNOSIS — Z992 Dependence on renal dialysis: Secondary | ICD-10-CM | POA: Diagnosis not present

## 2021-01-25 DIAGNOSIS — E1122 Type 2 diabetes mellitus with diabetic chronic kidney disease: Secondary | ICD-10-CM | POA: Diagnosis not present

## 2021-01-27 DIAGNOSIS — D689 Coagulation defect, unspecified: Secondary | ICD-10-CM | POA: Diagnosis not present

## 2021-01-27 DIAGNOSIS — E1122 Type 2 diabetes mellitus with diabetic chronic kidney disease: Secondary | ICD-10-CM | POA: Diagnosis not present

## 2021-01-27 DIAGNOSIS — Z992 Dependence on renal dialysis: Secondary | ICD-10-CM | POA: Diagnosis not present

## 2021-01-27 DIAGNOSIS — N2581 Secondary hyperparathyroidism of renal origin: Secondary | ICD-10-CM | POA: Diagnosis not present

## 2021-01-27 DIAGNOSIS — N186 End stage renal disease: Secondary | ICD-10-CM | POA: Diagnosis not present

## 2021-01-30 DIAGNOSIS — N2581 Secondary hyperparathyroidism of renal origin: Secondary | ICD-10-CM | POA: Diagnosis not present

## 2021-01-30 DIAGNOSIS — N186 End stage renal disease: Secondary | ICD-10-CM | POA: Diagnosis not present

## 2021-01-30 DIAGNOSIS — D631 Anemia in chronic kidney disease: Secondary | ICD-10-CM | POA: Diagnosis not present

## 2021-01-30 DIAGNOSIS — Z992 Dependence on renal dialysis: Secondary | ICD-10-CM | POA: Diagnosis not present

## 2021-01-30 DIAGNOSIS — D689 Coagulation defect, unspecified: Secondary | ICD-10-CM | POA: Diagnosis not present

## 2021-02-01 DIAGNOSIS — D689 Coagulation defect, unspecified: Secondary | ICD-10-CM | POA: Diagnosis not present

## 2021-02-01 DIAGNOSIS — Z992 Dependence on renal dialysis: Secondary | ICD-10-CM | POA: Diagnosis not present

## 2021-02-01 DIAGNOSIS — N186 End stage renal disease: Secondary | ICD-10-CM | POA: Diagnosis not present

## 2021-02-01 DIAGNOSIS — N2581 Secondary hyperparathyroidism of renal origin: Secondary | ICD-10-CM | POA: Diagnosis not present

## 2021-02-01 DIAGNOSIS — D631 Anemia in chronic kidney disease: Secondary | ICD-10-CM | POA: Diagnosis not present

## 2021-02-03 ENCOUNTER — Other Ambulatory Visit: Payer: Self-pay | Admitting: Family Medicine

## 2021-02-03 DIAGNOSIS — D689 Coagulation defect, unspecified: Secondary | ICD-10-CM | POA: Diagnosis not present

## 2021-02-03 DIAGNOSIS — Z992 Dependence on renal dialysis: Secondary | ICD-10-CM | POA: Diagnosis not present

## 2021-02-03 DIAGNOSIS — N186 End stage renal disease: Secondary | ICD-10-CM | POA: Diagnosis not present

## 2021-02-03 DIAGNOSIS — N2581 Secondary hyperparathyroidism of renal origin: Secondary | ICD-10-CM | POA: Diagnosis not present

## 2021-02-03 DIAGNOSIS — D631 Anemia in chronic kidney disease: Secondary | ICD-10-CM | POA: Diagnosis not present

## 2021-02-06 DIAGNOSIS — D689 Coagulation defect, unspecified: Secondary | ICD-10-CM | POA: Diagnosis not present

## 2021-02-06 DIAGNOSIS — N186 End stage renal disease: Secondary | ICD-10-CM | POA: Diagnosis not present

## 2021-02-06 DIAGNOSIS — N2581 Secondary hyperparathyroidism of renal origin: Secondary | ICD-10-CM | POA: Diagnosis not present

## 2021-02-06 DIAGNOSIS — Z992 Dependence on renal dialysis: Secondary | ICD-10-CM | POA: Diagnosis not present

## 2021-02-08 DIAGNOSIS — Z992 Dependence on renal dialysis: Secondary | ICD-10-CM | POA: Diagnosis not present

## 2021-02-08 DIAGNOSIS — N186 End stage renal disease: Secondary | ICD-10-CM | POA: Diagnosis not present

## 2021-02-08 DIAGNOSIS — D689 Coagulation defect, unspecified: Secondary | ICD-10-CM | POA: Diagnosis not present

## 2021-02-08 DIAGNOSIS — N2581 Secondary hyperparathyroidism of renal origin: Secondary | ICD-10-CM | POA: Diagnosis not present

## 2021-02-10 DIAGNOSIS — Z992 Dependence on renal dialysis: Secondary | ICD-10-CM | POA: Diagnosis not present

## 2021-02-10 DIAGNOSIS — D689 Coagulation defect, unspecified: Secondary | ICD-10-CM | POA: Diagnosis not present

## 2021-02-10 DIAGNOSIS — N2581 Secondary hyperparathyroidism of renal origin: Secondary | ICD-10-CM | POA: Diagnosis not present

## 2021-02-10 DIAGNOSIS — N186 End stage renal disease: Secondary | ICD-10-CM | POA: Diagnosis not present

## 2021-02-12 ENCOUNTER — Ambulatory Visit (INDEPENDENT_AMBULATORY_CARE_PROVIDER_SITE_OTHER): Payer: Medicare HMO

## 2021-02-12 DIAGNOSIS — I472 Ventricular tachycardia, unspecified: Secondary | ICD-10-CM

## 2021-02-12 LAB — CUP PACEART REMOTE DEVICE CHECK
Battery Remaining Longevity: 156 mo
Battery Remaining Percentage: 100 %
Brady Statistic RA Percent Paced: 2 %
Brady Statistic RV Percent Paced: 6 %
Date Time Interrogation Session: 20220828201000
HighPow Impedance: 45 Ohm
Implantable Lead Implant Date: 20091218
Implantable Lead Implant Date: 20091218
Implantable Lead Location: 753859
Implantable Lead Location: 753860
Implantable Lead Model: 158
Implantable Lead Serial Number: 131093
Implantable Pulse Generator Implant Date: 20220523
Lead Channel Impedance Value: 431 Ohm
Lead Channel Impedance Value: 490 Ohm
Lead Channel Setting Pacing Amplitude: 2 V
Lead Channel Setting Pacing Amplitude: 2.4 V
Lead Channel Setting Pacing Pulse Width: 0.4 ms
Lead Channel Setting Sensing Sensitivity: 0.5 mV
Pulse Gen Serial Number: 232629

## 2021-02-13 DIAGNOSIS — N2581 Secondary hyperparathyroidism of renal origin: Secondary | ICD-10-CM | POA: Diagnosis not present

## 2021-02-13 DIAGNOSIS — Z992 Dependence on renal dialysis: Secondary | ICD-10-CM | POA: Diagnosis not present

## 2021-02-13 DIAGNOSIS — N186 End stage renal disease: Secondary | ICD-10-CM | POA: Diagnosis not present

## 2021-02-13 DIAGNOSIS — D689 Coagulation defect, unspecified: Secondary | ICD-10-CM | POA: Diagnosis not present

## 2021-02-14 DIAGNOSIS — I129 Hypertensive chronic kidney disease with stage 1 through stage 4 chronic kidney disease, or unspecified chronic kidney disease: Secondary | ICD-10-CM | POA: Diagnosis not present

## 2021-02-14 DIAGNOSIS — Z992 Dependence on renal dialysis: Secondary | ICD-10-CM | POA: Diagnosis not present

## 2021-02-14 DIAGNOSIS — N186 End stage renal disease: Secondary | ICD-10-CM | POA: Diagnosis not present

## 2021-02-15 DIAGNOSIS — Z992 Dependence on renal dialysis: Secondary | ICD-10-CM | POA: Diagnosis not present

## 2021-02-15 DIAGNOSIS — D689 Coagulation defect, unspecified: Secondary | ICD-10-CM | POA: Diagnosis not present

## 2021-02-15 DIAGNOSIS — N186 End stage renal disease: Secondary | ICD-10-CM | POA: Diagnosis not present

## 2021-02-15 DIAGNOSIS — N2581 Secondary hyperparathyroidism of renal origin: Secondary | ICD-10-CM | POA: Diagnosis not present

## 2021-02-17 DIAGNOSIS — N2581 Secondary hyperparathyroidism of renal origin: Secondary | ICD-10-CM | POA: Diagnosis not present

## 2021-02-17 DIAGNOSIS — Z992 Dependence on renal dialysis: Secondary | ICD-10-CM | POA: Diagnosis not present

## 2021-02-17 DIAGNOSIS — N186 End stage renal disease: Secondary | ICD-10-CM | POA: Diagnosis not present

## 2021-02-17 DIAGNOSIS — D689 Coagulation defect, unspecified: Secondary | ICD-10-CM | POA: Diagnosis not present

## 2021-02-20 DIAGNOSIS — N186 End stage renal disease: Secondary | ICD-10-CM | POA: Diagnosis not present

## 2021-02-20 DIAGNOSIS — Z992 Dependence on renal dialysis: Secondary | ICD-10-CM | POA: Diagnosis not present

## 2021-02-20 DIAGNOSIS — D689 Coagulation defect, unspecified: Secondary | ICD-10-CM | POA: Diagnosis not present

## 2021-02-20 DIAGNOSIS — N2581 Secondary hyperparathyroidism of renal origin: Secondary | ICD-10-CM | POA: Diagnosis not present

## 2021-02-21 ENCOUNTER — Other Ambulatory Visit: Payer: Self-pay | Admitting: Family Medicine

## 2021-02-22 DIAGNOSIS — N186 End stage renal disease: Secondary | ICD-10-CM | POA: Diagnosis not present

## 2021-02-22 DIAGNOSIS — N2581 Secondary hyperparathyroidism of renal origin: Secondary | ICD-10-CM | POA: Diagnosis not present

## 2021-02-22 DIAGNOSIS — Z992 Dependence on renal dialysis: Secondary | ICD-10-CM | POA: Diagnosis not present

## 2021-02-22 DIAGNOSIS — D689 Coagulation defect, unspecified: Secondary | ICD-10-CM | POA: Diagnosis not present

## 2021-02-23 NOTE — Progress Notes (Signed)
Remote ICD transmission.   

## 2021-02-24 DIAGNOSIS — Z992 Dependence on renal dialysis: Secondary | ICD-10-CM | POA: Diagnosis not present

## 2021-02-24 DIAGNOSIS — N2581 Secondary hyperparathyroidism of renal origin: Secondary | ICD-10-CM | POA: Diagnosis not present

## 2021-02-24 DIAGNOSIS — N186 End stage renal disease: Secondary | ICD-10-CM | POA: Diagnosis not present

## 2021-02-24 DIAGNOSIS — D689 Coagulation defect, unspecified: Secondary | ICD-10-CM | POA: Diagnosis not present

## 2021-02-26 ENCOUNTER — Encounter: Payer: Self-pay | Admitting: Family Medicine

## 2021-02-26 ENCOUNTER — Ambulatory Visit (INDEPENDENT_AMBULATORY_CARE_PROVIDER_SITE_OTHER): Payer: Medicare HMO | Admitting: Family Medicine

## 2021-02-26 ENCOUNTER — Other Ambulatory Visit: Payer: Self-pay

## 2021-02-26 VITALS — BP 122/70 | HR 74 | Temp 98.0°F | Resp 20 | Ht 66.0 in | Wt 172.2 lb

## 2021-02-26 DIAGNOSIS — Z0000 Encounter for general adult medical examination without abnormal findings: Secondary | ICD-10-CM | POA: Diagnosis not present

## 2021-02-26 DIAGNOSIS — Z23 Encounter for immunization: Secondary | ICD-10-CM

## 2021-02-26 DIAGNOSIS — Z125 Encounter for screening for malignant neoplasm of prostate: Secondary | ICD-10-CM

## 2021-02-26 DIAGNOSIS — N186 End stage renal disease: Secondary | ICD-10-CM | POA: Diagnosis not present

## 2021-02-26 DIAGNOSIS — E1122 Type 2 diabetes mellitus with diabetic chronic kidney disease: Secondary | ICD-10-CM | POA: Diagnosis not present

## 2021-02-26 DIAGNOSIS — E785 Hyperlipidemia, unspecified: Secondary | ICD-10-CM | POA: Diagnosis not present

## 2021-02-26 LAB — CBC WITH DIFFERENTIAL/PLATELET
Basophils Absolute: 0.1 10*3/uL (ref 0.0–0.1)
Basophils Relative: 1.2 % (ref 0.0–3.0)
Eosinophils Absolute: 0.2 10*3/uL (ref 0.0–0.7)
Eosinophils Relative: 4.4 % (ref 0.0–5.0)
HCT: 33.1 % — ABNORMAL LOW (ref 39.0–52.0)
Hemoglobin: 10.7 g/dL — ABNORMAL LOW (ref 13.0–17.0)
Lymphocytes Relative: 22.1 % (ref 12.0–46.0)
Lymphs Abs: 1 10*3/uL (ref 0.7–4.0)
MCHC: 32.4 g/dL (ref 30.0–36.0)
MCV: 88.8 fl (ref 78.0–100.0)
Monocytes Absolute: 0.5 10*3/uL (ref 0.1–1.0)
Monocytes Relative: 10.9 % (ref 3.0–12.0)
Neutro Abs: 2.9 10*3/uL (ref 1.4–7.7)
Neutrophils Relative %: 61.4 % (ref 43.0–77.0)
Platelets: 135 10*3/uL — ABNORMAL LOW (ref 150.0–400.0)
RBC: 3.72 Mil/uL — ABNORMAL LOW (ref 4.22–5.81)
RDW: 15.2 % (ref 11.5–15.5)
WBC: 4.7 10*3/uL (ref 4.0–10.5)

## 2021-02-26 LAB — BASIC METABOLIC PANEL
BUN: 55 mg/dL — ABNORMAL HIGH (ref 6–23)
CO2: 29 mEq/L (ref 19–32)
Calcium: 9.7 mg/dL (ref 8.4–10.5)
Chloride: 94 mEq/L — ABNORMAL LOW (ref 96–112)
Creatinine, Ser: 6.57 mg/dL (ref 0.40–1.50)
GFR: 7.51 mL/min — CL (ref >60.00)
Glucose, Bld: 82 mg/dL (ref 70–99)
Potassium: 4.6 mEq/L (ref 3.5–5.1)
Sodium: 138 mEq/L (ref 135–145)

## 2021-02-26 LAB — LIPID PANEL
Cholesterol: 169 mg/dL (ref 0–200)
HDL: 48.1 mg/dL (ref >39.00)
LDL Cholesterol: 91 mg/dL (ref 0–99)
NonHDL: 120.96
Total CHOL/HDL Ratio: 4
Triglycerides: 151 mg/dL — ABNORMAL HIGH (ref 0.0–149.0)
VLDL: 30.2 mg/dL (ref 0.0–40.0)

## 2021-02-26 LAB — HEPATIC FUNCTION PANEL
ALT: 13 U/L (ref 0–53)
AST: 15 U/L (ref 0–37)
Albumin: 4.3 g/dL (ref 3.5–5.2)
Alkaline Phosphatase: 68 U/L (ref 39–117)
Bilirubin, Direct: 0.1 mg/dL (ref 0.0–0.3)
Total Bilirubin: 0.4 mg/dL (ref 0.2–1.2)
Total Protein: 7.5 g/dL (ref 6.0–8.3)

## 2021-02-26 LAB — HEMOGLOBIN A1C: Hgb A1c MFr Bld: 5.8 % (ref 4.6–6.5)

## 2021-02-26 LAB — PSA, MEDICARE: PSA: 0.4 ng/ml (ref 0.10–4.00)

## 2021-02-26 LAB — TSH: TSH: 3.32 u[IU]/mL (ref 0.35–5.50)

## 2021-02-26 NOTE — Patient Instructions (Signed)
Follow up in 6 months to recheck sugar, BP, and cholesterol We'll notify you of your lab results and make any changes if needed Please get your eye exam and have them send me the results Continue to make healthy food choices and stay active Get your COVID booster at your convenience Call with any questions or concerns Happy Fall!!!

## 2021-02-26 NOTE — Assessment & Plan Note (Signed)
Chronic problem.  Has recently been diet controlled.  UTD on foot exam.  Due for eye exam.  No need for microalbumin due to ESRD.  Check labs.  Restart meds prn.

## 2021-02-26 NOTE — Assessment & Plan Note (Signed)
Pt's PE unchanged from previous.  Again encouraged him to get eye exam.  Pt got flu shot today.  Will get COVID booster at pharmacy.  Check labs.  Anticipatory guidance provided.

## 2021-02-26 NOTE — Progress Notes (Signed)
Subjective:    Patient ID: Tyler Hahn, male    DOB: 07-16-1940, 80 y.o.   MRN: [id number]  HPI CPE- UTD on colonoscopy, foot exam.  Due for eye exam.  UTD on pneumonia vaccines.  Due for flu.  Pt needs COVID booster.  Patient Care Team    Relationship Specialty Notifications Start End  [name], MD PCP - General   05/30/10   [name], MD Consulting Physician Cardiology  02/14/15   Renato Shin, MD Consulting Physician Endocrinology  02/14/15   Ladene Artist, MD Consulting Physician Gastroenterology  05/18/15   Rosita Fire, MD Consulting Physician Nephrology  08/17/19   [name], [facility] Pharmacist Pharmacist Admissions 09/30/19    Comment: PHONE NUMBER [phone]    Health Maintenance  Topic Date Due   Zoster Vaccines- Shingrix (1 of 2) Never done   TETANUS/TDAP  06/17/2017   OPHTHALMOLOGY EXAM  08/25/2017   COVID-19 Vaccine (3 - Moderna risk series) 09/18/2019   INFLUENZA VACCINE  01/15/2021   HEMOGLOBIN A1C  04/18/2021   FOOT EXAM  11/24/2021   Hepatitis C Screening  Completed   PNA vac Low Risk Adult  Completed   HPV VACCINES  Aged Out      Review of Systems Patient reports no vision/hearing changes, anorexia, fever ,adenopathy, persistant/recurrent hoarseness, swallowing issues, chest pain, palpitations, edema, persistant/recurrent cough, hemoptysis, dyspnea (rest,exertional, paroxysmal nocturnal), gastrointestinal  bleeding (melena, rectal bleeding), abdominal pain, excessive heart burn, GU symptoms (dysuria, hematuria, voiding/incontinence issues) syncope, focal weakness, memory loss, skin/hair/nail changes, depression, anxiety, abnormal bruising/bleeding, musculoskeletal symptoms/signs.   + numbness of feet intermittently  This visit occurred during the SARS-CoV-2 public health emergency.  Safety protocols were in place, including screening questions prior to the visit, additional usage of staff PPE, and extensive cleaning of exam room  while observing appropriate contact time as indicated for disinfecting solutions.      Objective:   Physical Exam General Appearance:    Alert, cooperative, no distress, appears stated age  Head:    Normocephalic, without obvious abnormality, atraumatic  Eyes:    PERRL, conjunctiva/corneas clear, EOM's intact, fundi    benign, both eyes       Ears:    Normal TM's and external ear canals, both ears  Nose:   Nares normal, septum midline, mucosa normal, no drainage   or sinus tenderness  Throat:   Lips, mucosa, and tongue normal; teeth and gums normal  Neck:   Supple, symmetrical, trachea midline, no adenopathy;       thyroid:  No enlargement/tenderness/nodules  Back:     Symmetric, no curvature, ROM normal, no CVA tenderness  Lungs:     Clear to auscultation bilaterally, respirations unlabored  Chest wall:    No tenderness or deformity  Heart:    Regular rate and rhythm, S1 and S2 normal, no murmur, rub   or gallop  Abdomen:     Soft, non-tender, bowel sounds active all four quadrants,    no masses, no organomegaly  Genitalia:    Deferred  Rectal:    Extremities:   Extremities normal, atraumatic, no cyanosis or edema  Pulses:   2+ and symmetric all extremities  Skin:   Skin color, texture, turgor normal, no rashes or lesions  Lymph nodes:   Cervical, supraclavicular, and axillary nodes normal  Neurologic:   CNII-XII intact. Normal strength, sensation and reflexes      throughout          Assessment &  Plan:

## 2021-02-26 NOTE — Assessment & Plan Note (Signed)
Chronic problem.  Check labs.  Adjust meds prn  

## 2021-02-27 DIAGNOSIS — Z992 Dependence on renal dialysis: Secondary | ICD-10-CM | POA: Diagnosis not present

## 2021-02-27 DIAGNOSIS — N186 End stage renal disease: Secondary | ICD-10-CM | POA: Diagnosis not present

## 2021-02-27 DIAGNOSIS — D631 Anemia in chronic kidney disease: Secondary | ICD-10-CM | POA: Diagnosis not present

## 2021-02-27 DIAGNOSIS — D689 Coagulation defect, unspecified: Secondary | ICD-10-CM | POA: Diagnosis not present

## 2021-02-27 DIAGNOSIS — N2581 Secondary hyperparathyroidism of renal origin: Secondary | ICD-10-CM | POA: Diagnosis not present

## 2021-02-27 DIAGNOSIS — E1122 Type 2 diabetes mellitus with diabetic chronic kidney disease: Secondary | ICD-10-CM | POA: Diagnosis not present

## 2021-03-01 DIAGNOSIS — E1122 Type 2 diabetes mellitus with diabetic chronic kidney disease: Secondary | ICD-10-CM | POA: Diagnosis not present

## 2021-03-01 DIAGNOSIS — D689 Coagulation defect, unspecified: Secondary | ICD-10-CM | POA: Diagnosis not present

## 2021-03-01 DIAGNOSIS — D631 Anemia in chronic kidney disease: Secondary | ICD-10-CM | POA: Diagnosis not present

## 2021-03-01 DIAGNOSIS — N186 End stage renal disease: Secondary | ICD-10-CM | POA: Diagnosis not present

## 2021-03-01 DIAGNOSIS — N2581 Secondary hyperparathyroidism of renal origin: Secondary | ICD-10-CM | POA: Diagnosis not present

## 2021-03-01 DIAGNOSIS — Z992 Dependence on renal dialysis: Secondary | ICD-10-CM | POA: Diagnosis not present

## 2021-03-03 DIAGNOSIS — E1122 Type 2 diabetes mellitus with diabetic chronic kidney disease: Secondary | ICD-10-CM | POA: Diagnosis not present

## 2021-03-03 DIAGNOSIS — Z992 Dependence on renal dialysis: Secondary | ICD-10-CM | POA: Diagnosis not present

## 2021-03-03 DIAGNOSIS — N186 End stage renal disease: Secondary | ICD-10-CM | POA: Diagnosis not present

## 2021-03-03 DIAGNOSIS — N2581 Secondary hyperparathyroidism of renal origin: Secondary | ICD-10-CM | POA: Diagnosis not present

## 2021-03-03 DIAGNOSIS — D689 Coagulation defect, unspecified: Secondary | ICD-10-CM | POA: Diagnosis not present

## 2021-03-03 DIAGNOSIS — D631 Anemia in chronic kidney disease: Secondary | ICD-10-CM | POA: Diagnosis not present

## 2021-03-05 ENCOUNTER — Other Ambulatory Visit: Payer: Self-pay | Admitting: Family Medicine

## 2021-03-06 DIAGNOSIS — D689 Coagulation defect, unspecified: Secondary | ICD-10-CM | POA: Diagnosis not present

## 2021-03-06 DIAGNOSIS — N186 End stage renal disease: Secondary | ICD-10-CM | POA: Diagnosis not present

## 2021-03-06 DIAGNOSIS — N2581 Secondary hyperparathyroidism of renal origin: Secondary | ICD-10-CM | POA: Diagnosis not present

## 2021-03-06 DIAGNOSIS — Z992 Dependence on renal dialysis: Secondary | ICD-10-CM | POA: Diagnosis not present

## 2021-03-08 DIAGNOSIS — Z992 Dependence on renal dialysis: Secondary | ICD-10-CM | POA: Diagnosis not present

## 2021-03-08 DIAGNOSIS — N2581 Secondary hyperparathyroidism of renal origin: Secondary | ICD-10-CM | POA: Diagnosis not present

## 2021-03-08 DIAGNOSIS — N186 End stage renal disease: Secondary | ICD-10-CM | POA: Diagnosis not present

## 2021-03-08 DIAGNOSIS — D689 Coagulation defect, unspecified: Secondary | ICD-10-CM | POA: Diagnosis not present

## 2021-03-10 DIAGNOSIS — Z992 Dependence on renal dialysis: Secondary | ICD-10-CM | POA: Diagnosis not present

## 2021-03-10 DIAGNOSIS — D689 Coagulation defect, unspecified: Secondary | ICD-10-CM | POA: Diagnosis not present

## 2021-03-10 DIAGNOSIS — N186 End stage renal disease: Secondary | ICD-10-CM | POA: Diagnosis not present

## 2021-03-10 DIAGNOSIS — N2581 Secondary hyperparathyroidism of renal origin: Secondary | ICD-10-CM | POA: Diagnosis not present

## 2021-03-12 DIAGNOSIS — I472 Ventricular tachycardia, unspecified: Secondary | ICD-10-CM | POA: Insufficient documentation

## 2021-03-13 DIAGNOSIS — N2581 Secondary hyperparathyroidism of renal origin: Secondary | ICD-10-CM | POA: Diagnosis not present

## 2021-03-13 DIAGNOSIS — D509 Iron deficiency anemia, unspecified: Secondary | ICD-10-CM | POA: Diagnosis not present

## 2021-03-13 DIAGNOSIS — N186 End stage renal disease: Secondary | ICD-10-CM | POA: Diagnosis not present

## 2021-03-13 DIAGNOSIS — D689 Coagulation defect, unspecified: Secondary | ICD-10-CM | POA: Diagnosis not present

## 2021-03-13 DIAGNOSIS — Z992 Dependence on renal dialysis: Secondary | ICD-10-CM | POA: Diagnosis not present

## 2021-03-14 ENCOUNTER — Other Ambulatory Visit: Payer: Self-pay

## 2021-03-14 ENCOUNTER — Ambulatory Visit: Payer: Medicare HMO | Admitting: Internal Medicine

## 2021-03-14 ENCOUNTER — Encounter: Payer: Self-pay | Admitting: Internal Medicine

## 2021-03-14 VITALS — BP 122/70 | HR 66 | Ht 66.0 in | Wt 165.0 lb

## 2021-03-14 DIAGNOSIS — I5022 Chronic systolic (congestive) heart failure: Secondary | ICD-10-CM

## 2021-03-14 DIAGNOSIS — I472 Ventricular tachycardia, unspecified: Secondary | ICD-10-CM

## 2021-03-14 DIAGNOSIS — Z95810 Presence of automatic (implantable) cardiac defibrillator: Secondary | ICD-10-CM | POA: Diagnosis not present

## 2021-03-14 DIAGNOSIS — I429 Cardiomyopathy, unspecified: Secondary | ICD-10-CM

## 2021-03-14 NOTE — Progress Notes (Signed)
Patient Care Team: [name], MD as PCP - Smitty Knudsen, MD as Consulting Physician (Cardiology) Renato Shin, MD as Consulting Physician (Endocrinology) Ladene Artist, MD as Consulting Physician (Gastroenterology) Rosita Fire, MD as Consulting Physician (Nephrology) [name], [facility] as Pharmacist (Pharmacist)   HPI  Tyler Hahn is a 80 y.o. male Seen in follow-up for a [employer] ICD implanted 2009 for primary prevention in the setting of nonischemic cardiomyopathy with subsequent appropriate therapy for VT VF, albeit in the context of hypokalemia and ongoing generator change 6/22  End-stage renal disease on renal replacement therapy  Guideline directed medical therapy limited by ESRD and hypotension previusly intolerant of even low dose carvedilol  DATE TEST EF   4/14 Echo   20-25 %         Date Cr K Hgb  9//22 6.57 4.6 10.7           Records and Results Reviewed   Past Medical History:  Diagnosis Date   Anxiety    Automatic implantable cardioverter-defibrillator in situ    greg taylor   CHF (congestive heart failure) ([place])    2000   Diabetes mellitus    no meds   Fatty liver    Gout    "bout 2-3 months ago"-meds helped.   Hyperlipidemia    Hypertension    Hyperthyroidism    Kidney cysts    PONV (postoperative nausea and vomiting)    Renal insufficiency     Past Surgical History:  Procedure Laterality Date   AV FISTULA PLACEMENT Right 08/23/2019   Procedure: ARTERIOVENOUS GORTEX GRAFT RIGHT ARM;  Surgeon: Rosetta Posner, MD;  Location: [facility];  Service: Vascular;  Laterality: Right;   CARDIAC DEFIBRILLATOR PLACEMENT     COLONOSCOPY WITH PROPOFOL N/A 10/03/2015   Procedure: COLONOSCOPY WITH PROPOFOL;  Surgeon: Ladene Artist, MD;  Location: [facility];  Service: Endoscopy;  Laterality: N/A;   DIAGNOSTIC LAPAROSCOPY  01/27/2014   Dr Brantley Stage   ENTEROSCOPY N/A 11/25/2013   Procedure: ENTEROSCOPY;  Surgeon:  Ladene Artist, MD;  Location: [facility];  Service: Endoscopy;  Laterality: N/A;   ICD GENERATOR CHANGEOUT N/A 11/06/2020   Procedure: ICD GENERATOR CHANGEOUT;  Surgeon: [name], MD;  Location: [facility];  Service: Cardiovascular;  Laterality: N/A;   ICD,[employer]     LAPAROSCOPY N/A 01/27/2014   Procedure: LAPAROSCOPY DIAGNOSTIC;  Surgeon: Joyice Faster. Cornett, MD;  Location: [facility] OR;  Service: General;  Laterality: N/A;   polyp removal throat     difficulty speaking    Current Meds  Medication Sig   allopurinol (ZYLOPRIM) 100 MG tablet TAKE 1 TABLET BY MOUTH EVERY DAY (Patient taking differently: Pt reports taking 3 times a week)   atorvastatin (LIPITOR) 20 MG tablet TAKE 1 TABLET BY MOUTH EVERY DAY   donepezil (ARICEPT) 5 MG tablet TAKE 1 TABLET BY MOUTH EVERYDAY AT BEDTIME   levothyroxine (SYNTHROID) 25 MCG tablet TAKE 1 TABLET BY MOUTH EVERY DAY BEFORE BREAKFAST   Multiple Vitamins-Minerals (MULTIVITAMIN WITH MINERALS) tablet Take 1 tablet by mouth daily.   OVER THE COUNTER MEDICATION Take 1 capsule by mouth daily. Brain Food   sertraline (ZOLOFT) 100 MG tablet TAKE 1 TABLET BY MOUTH EVERY DAY    Allergies  Allergen Reactions   Sulfa Antibiotics Itching and Rash   Sulfonamide Derivatives Itching and Rash      Review of Systems negative except from HPI and PMH  Physical Exam BP 122/70   Pulse 66   Ht 5\' 6"  (1.676 m)   Wt 165 lb (74.8 kg)   SpO2 96%   BMI 26.63 kg/m  Well developed and well nourished in no acute distress HENT normal E scleral and icterus clear Neck Supple JVP flat; carotids brisk and full Clear to ausculation Regular rate and rhythm, no murmurs gallops or rub Soft with active bowel sounds No clubbing cyanosis  Edema Alert and oriented, grossly normal motor and sensory function Skin Warm and Dry  ECG sinus  @ 66 24/11/50  Estimated Creatinine Clearance: 8.4 mL/min (A) (by C-G formula based on SCr of 6.57 mg/dL  [facility])).   Assessment and  Plan  Nonischemic cardiomyopathy  Continue   Congestive heart failure-chronic-systolic      Hypotension   vt-NS no intercurrent VT    Implantable defibrillator-[employer] dual-chamber   Renal failure  RRT   Euvolemic.  Guideline directed medical therapy has been limited by hypotension.  Device function is normal  Current medicines are reviewed at length with the patient today .  The patient does not  have concerns regarding medicines.

## 2021-03-14 NOTE — Patient Instructions (Signed)
Medication Instructions:  Your physician recommends that you continue on your current medications as directed. Please refer to the Current Medication list given to you today.  *If you need a refill on your cardiac medications before your next appointment, please call your pharmacy*   Lab Work: None ordered.  If you have labs (blood work) drawn today and your tests are completely normal, you will receive your results only by: MyChart Message (if you have MyChart) OR A paper copy in the mail If you have any lab test that is abnormal or we need to change your treatment, we will call you to review the results.   Testing/Procedures: None ordered.    Follow-Up: At [facility], you and your health needs are our priority.  As part of our continuing mission to provide you with exceptional heart care, we have created designated Provider Care Teams.  These Care Teams include your primary Cardiologist (physician) and Advanced Practice Providers (APPs -  Physician Assistants and Nurse Practitioners) who all work together to provide you with the care you need, when you need it.  We recommend signing up for the patient portal called "MyChart".  Sign up information is provided on this After Visit Summary.  MyChart is used to connect with patients for Virtual Visits (Telemedicine).  Patients are able to view lab/test results, encounter notes, upcoming appointments, etc.  Non-urgent messages can be sent to your provider as well.   To learn more about what you can do with MyChart, go to [email or web address].    Your next appointment:   9 month(s)  The format for your next appointment:   In Person  Provider:   You will see one of the following Advanced Practice Providers on your designated Care Team:   Renee Ursuy, PA-C Michael "Andy" Tillery, PA-C  

## 2021-03-15 DIAGNOSIS — D509 Iron deficiency anemia, unspecified: Secondary | ICD-10-CM | POA: Diagnosis not present

## 2021-03-15 DIAGNOSIS — N186 End stage renal disease: Secondary | ICD-10-CM | POA: Diagnosis not present

## 2021-03-15 DIAGNOSIS — N2581 Secondary hyperparathyroidism of renal origin: Secondary | ICD-10-CM | POA: Diagnosis not present

## 2021-03-15 DIAGNOSIS — Z992 Dependence on renal dialysis: Secondary | ICD-10-CM | POA: Diagnosis not present

## 2021-03-15 DIAGNOSIS — D689 Coagulation defect, unspecified: Secondary | ICD-10-CM | POA: Diagnosis not present

## 2021-03-16 DIAGNOSIS — Z992 Dependence on renal dialysis: Secondary | ICD-10-CM | POA: Diagnosis not present

## 2021-03-16 DIAGNOSIS — I129 Hypertensive chronic kidney disease with stage 1 through stage 4 chronic kidney disease, or unspecified chronic kidney disease: Secondary | ICD-10-CM | POA: Diagnosis not present

## 2021-03-16 DIAGNOSIS — N186 End stage renal disease: Secondary | ICD-10-CM | POA: Diagnosis not present

## 2021-03-17 DIAGNOSIS — D689 Coagulation defect, unspecified: Secondary | ICD-10-CM | POA: Diagnosis not present

## 2021-03-17 DIAGNOSIS — D509 Iron deficiency anemia, unspecified: Secondary | ICD-10-CM | POA: Diagnosis not present

## 2021-03-17 DIAGNOSIS — N2581 Secondary hyperparathyroidism of renal origin: Secondary | ICD-10-CM | POA: Diagnosis not present

## 2021-03-17 DIAGNOSIS — N186 End stage renal disease: Secondary | ICD-10-CM | POA: Diagnosis not present

## 2021-03-17 DIAGNOSIS — Z992 Dependence on renal dialysis: Secondary | ICD-10-CM | POA: Diagnosis not present

## 2021-03-20 DIAGNOSIS — D509 Iron deficiency anemia, unspecified: Secondary | ICD-10-CM | POA: Diagnosis not present

## 2021-03-20 DIAGNOSIS — D689 Coagulation defect, unspecified: Secondary | ICD-10-CM | POA: Diagnosis not present

## 2021-03-20 DIAGNOSIS — N2581 Secondary hyperparathyroidism of renal origin: Secondary | ICD-10-CM | POA: Diagnosis not present

## 2021-03-20 DIAGNOSIS — Z992 Dependence on renal dialysis: Secondary | ICD-10-CM | POA: Diagnosis not present

## 2021-03-20 DIAGNOSIS — N186 End stage renal disease: Secondary | ICD-10-CM | POA: Diagnosis not present

## 2021-03-22 DIAGNOSIS — N2581 Secondary hyperparathyroidism of renal origin: Secondary | ICD-10-CM | POA: Diagnosis not present

## 2021-03-22 DIAGNOSIS — D689 Coagulation defect, unspecified: Secondary | ICD-10-CM | POA: Diagnosis not present

## 2021-03-22 DIAGNOSIS — D509 Iron deficiency anemia, unspecified: Secondary | ICD-10-CM | POA: Diagnosis not present

## 2021-03-22 DIAGNOSIS — Z992 Dependence on renal dialysis: Secondary | ICD-10-CM | POA: Diagnosis not present

## 2021-03-22 DIAGNOSIS — N186 End stage renal disease: Secondary | ICD-10-CM | POA: Diagnosis not present

## 2021-03-23 ENCOUNTER — Other Ambulatory Visit: Payer: Self-pay | Admitting: Family Medicine

## 2021-03-24 DIAGNOSIS — Z992 Dependence on renal dialysis: Secondary | ICD-10-CM | POA: Diagnosis not present

## 2021-03-24 DIAGNOSIS — N186 End stage renal disease: Secondary | ICD-10-CM | POA: Diagnosis not present

## 2021-03-24 DIAGNOSIS — D509 Iron deficiency anemia, unspecified: Secondary | ICD-10-CM | POA: Diagnosis not present

## 2021-03-24 DIAGNOSIS — N2581 Secondary hyperparathyroidism of renal origin: Secondary | ICD-10-CM | POA: Diagnosis not present

## 2021-03-24 DIAGNOSIS — D689 Coagulation defect, unspecified: Secondary | ICD-10-CM | POA: Diagnosis not present

## 2021-03-25 ENCOUNTER — Other Ambulatory Visit: Payer: Self-pay | Admitting: Family Medicine

## 2021-03-27 ENCOUNTER — Telehealth: Payer: Self-pay

## 2021-03-27 DIAGNOSIS — D509 Iron deficiency anemia, unspecified: Secondary | ICD-10-CM | POA: Diagnosis not present

## 2021-03-27 DIAGNOSIS — D689 Coagulation defect, unspecified: Secondary | ICD-10-CM | POA: Diagnosis not present

## 2021-03-27 DIAGNOSIS — E1122 Type 2 diabetes mellitus with diabetic chronic kidney disease: Secondary | ICD-10-CM | POA: Diagnosis not present

## 2021-03-27 DIAGNOSIS — N2581 Secondary hyperparathyroidism of renal origin: Secondary | ICD-10-CM | POA: Diagnosis not present

## 2021-03-27 DIAGNOSIS — N186 End stage renal disease: Secondary | ICD-10-CM | POA: Diagnosis not present

## 2021-03-27 DIAGNOSIS — Z992 Dependence on renal dialysis: Secondary | ICD-10-CM | POA: Diagnosis not present

## 2021-03-27 NOTE — Progress Notes (Signed)
Chronic Care Management Pharmacy Assistant   Name: Tyler Hahn  MRN: [id number] DOB: [birth year]   Reason for Encounter: Disease State - General Adherence Call    Recent office visits:  02/26/21 [name], MD (PCP) - Family Medicine - Physical Exam - Labs were ordered. No medication changes. Follow up in 6 months.   Recent consult visits:  03/14/21 Virl Axe, MD - Cardiology - Systolic Heart Failure - EKG done and pacemaker check performed. No medication changes. Follow up in 9 months.   2/40/97 [employer] [place] - ESRD - no notes available.   3/53/29 [employer] [place] - ESRD - no notes available.   Hospital visits: 11/06/20  Medication Reconciliation was completed by comparing discharge summary, patient's EMR and Pharmacy list, and upon discussion with patient.   Admitted to the hospital on 11/06/20 due to ICD Generator Change out.Marland Kitchen Discharge date was 11/06/20. Discharged from [facility]?Medications Started at [facility] Discharge:?? No new medications were noted.    Medication Changes at Hospital Discharge: No changes in medications noted.    Medications Discontinued at Hospital Discharge: Np medications were discontinued at discharge.    Medications that remain the same after Hospital Discharge:??  All other medications will remain the same.   Medications: Outpatient Encounter Medications as of 03/27/2021  Medication Sig   allopurinol (ZYLOPRIM) 100 MG tablet Pt reports taking 3 times a week   atorvastatin (LIPITOR) 20 MG tablet TAKE 1 TABLET BY MOUTH EVERY DAY   donepezil (ARICEPT) 5 MG tablet TAKE 1 TABLET BY MOUTH EVERYDAY AT BEDTIME   levothyroxine (SYNTHROID) 25 MCG tablet TAKE 1 TABLET BY MOUTH EVERY DAY BEFORE BREAKFAST   Multiple Vitamins-Minerals (MULTIVITAMIN WITH MINERALS) tablet Take 1 tablet by mouth daily.   OVER THE COUNTER MEDICATION Take 1 capsule by mouth daily. Brain Food   sertraline (ZOLOFT)  100 MG tablet TAKE 1 TABLET BY MOUTH EVERY DAY   No facility-administered encounter medications on file as of 03/27/2021.    Have you had any problems recently with your health? Tyler Hahn  denied any problems with his health recently.  Have you had any problems with your pharmacy? Tyler Hahn denied any problems with his current pharmacy.  What issues or side effects are you having with your medications? Patient's Hahn denied any issues or side effects with his current medications.   What would you like me to pass along to [name], CPP for them to help you with?  Patient's Hahn did not have anything to pass along to CPP at this time other than some memory concerns lately. He feels patient has become more forgetful lately.   What can we do to take care of you better? Patient's Hahn did not have any suggestions at this time.    Care Gaps  AWV: done 02/26/21  Colonoscopy: done 10/03/15 DM Eye Exam: overdue 08/25/17 DM Foot Exam: due 11/24/21 Microalbumin: N/A HbgAIC:done 02/26/21 (5.8) DEXA: N/A Mammogram: N/A   Star Rating Drugs: Atorvastatin (LIPITOR) 20 MG tablet - last filled 01/23/21 30 days  Future Appointments  Date Time Provider [place]  05/14/2021  7:20 AM [facility] DEVICE REMOTES [facility]  05/23/2021 10:30 AM [facility] C[facility] PHARMACIST [facility]  08/13/2021  7:20 AM [facility] DEVICE REMOTES [facility]  02/11/2022  7:20 AM [facility] DEVICE REMOTES [facility]  05/13/2022  7:20 AM [facility] DEVICE REMOTES [facility]   Jobe Gibbon, [place] Clinical Pharmacist Assistant  [phone]  Time Spent: 38 minutes

## 2021-03-29 ENCOUNTER — Other Ambulatory Visit: Payer: Self-pay

## 2021-03-29 DIAGNOSIS — N2581 Secondary hyperparathyroidism of renal origin: Secondary | ICD-10-CM | POA: Diagnosis not present

## 2021-03-29 DIAGNOSIS — D689 Coagulation defect, unspecified: Secondary | ICD-10-CM | POA: Diagnosis not present

## 2021-03-29 DIAGNOSIS — D509 Iron deficiency anemia, unspecified: Secondary | ICD-10-CM | POA: Diagnosis not present

## 2021-03-29 DIAGNOSIS — Z992 Dependence on renal dialysis: Secondary | ICD-10-CM | POA: Diagnosis not present

## 2021-03-29 DIAGNOSIS — N186 End stage renal disease: Secondary | ICD-10-CM | POA: Diagnosis not present

## 2021-03-29 DIAGNOSIS — E1122 Type 2 diabetes mellitus with diabetic chronic kidney disease: Secondary | ICD-10-CM | POA: Diagnosis not present

## 2021-03-29 MED ORDER — ALLOPURINOL 100 MG PO TABS: ORAL_TABLET | ORAL | 0 refills | Status: DC

## 2021-03-29 NOTE — Telephone Encounter (Signed)
Pharmacy calling for clarification on medication  Should he be taking 3 times a weekly or daily

## 2021-03-31 DIAGNOSIS — E1122 Type 2 diabetes mellitus with diabetic chronic kidney disease: Secondary | ICD-10-CM | POA: Diagnosis not present

## 2021-03-31 DIAGNOSIS — N2581 Secondary hyperparathyroidism of renal origin: Secondary | ICD-10-CM | POA: Diagnosis not present

## 2021-03-31 DIAGNOSIS — D689 Coagulation defect, unspecified: Secondary | ICD-10-CM | POA: Diagnosis not present

## 2021-03-31 DIAGNOSIS — D509 Iron deficiency anemia, unspecified: Secondary | ICD-10-CM | POA: Diagnosis not present

## 2021-03-31 DIAGNOSIS — N186 End stage renal disease: Secondary | ICD-10-CM | POA: Diagnosis not present

## 2021-03-31 DIAGNOSIS — Z992 Dependence on renal dialysis: Secondary | ICD-10-CM | POA: Diagnosis not present

## 2021-04-03 DIAGNOSIS — D689 Coagulation defect, unspecified: Secondary | ICD-10-CM | POA: Diagnosis not present

## 2021-04-03 DIAGNOSIS — D509 Iron deficiency anemia, unspecified: Secondary | ICD-10-CM | POA: Diagnosis not present

## 2021-04-03 DIAGNOSIS — N2581 Secondary hyperparathyroidism of renal origin: Secondary | ICD-10-CM | POA: Diagnosis not present

## 2021-04-03 DIAGNOSIS — Z992 Dependence on renal dialysis: Secondary | ICD-10-CM | POA: Diagnosis not present

## 2021-04-03 DIAGNOSIS — N186 End stage renal disease: Secondary | ICD-10-CM | POA: Diagnosis not present

## 2021-04-04 ENCOUNTER — Other Ambulatory Visit: Payer: Self-pay | Admitting: Family Medicine

## 2021-04-05 DIAGNOSIS — N186 End stage renal disease: Secondary | ICD-10-CM | POA: Diagnosis not present

## 2021-04-05 DIAGNOSIS — D689 Coagulation defect, unspecified: Secondary | ICD-10-CM | POA: Diagnosis not present

## 2021-04-05 DIAGNOSIS — D509 Iron deficiency anemia, unspecified: Secondary | ICD-10-CM | POA: Diagnosis not present

## 2021-04-05 DIAGNOSIS — Z992 Dependence on renal dialysis: Secondary | ICD-10-CM | POA: Diagnosis not present

## 2021-04-05 DIAGNOSIS — N2581 Secondary hyperparathyroidism of renal origin: Secondary | ICD-10-CM | POA: Diagnosis not present

## 2021-04-07 DIAGNOSIS — D509 Iron deficiency anemia, unspecified: Secondary | ICD-10-CM | POA: Diagnosis not present

## 2021-04-07 DIAGNOSIS — N2581 Secondary hyperparathyroidism of renal origin: Secondary | ICD-10-CM | POA: Diagnosis not present

## 2021-04-07 DIAGNOSIS — D689 Coagulation defect, unspecified: Secondary | ICD-10-CM | POA: Diagnosis not present

## 2021-04-07 DIAGNOSIS — N186 End stage renal disease: Secondary | ICD-10-CM | POA: Diagnosis not present

## 2021-04-07 DIAGNOSIS — Z992 Dependence on renal dialysis: Secondary | ICD-10-CM | POA: Diagnosis not present

## 2021-04-10 DIAGNOSIS — N186 End stage renal disease: Secondary | ICD-10-CM | POA: Diagnosis not present

## 2021-04-10 DIAGNOSIS — D689 Coagulation defect, unspecified: Secondary | ICD-10-CM | POA: Diagnosis not present

## 2021-04-10 DIAGNOSIS — N2581 Secondary hyperparathyroidism of renal origin: Secondary | ICD-10-CM | POA: Diagnosis not present

## 2021-04-10 DIAGNOSIS — Z992 Dependence on renal dialysis: Secondary | ICD-10-CM | POA: Diagnosis not present

## 2021-04-10 DIAGNOSIS — D631 Anemia in chronic kidney disease: Secondary | ICD-10-CM | POA: Diagnosis not present

## 2021-04-10 DIAGNOSIS — D509 Iron deficiency anemia, unspecified: Secondary | ICD-10-CM | POA: Diagnosis not present

## 2021-04-12 DIAGNOSIS — N2581 Secondary hyperparathyroidism of renal origin: Secondary | ICD-10-CM | POA: Diagnosis not present

## 2021-04-12 DIAGNOSIS — N186 End stage renal disease: Secondary | ICD-10-CM | POA: Diagnosis not present

## 2021-04-12 DIAGNOSIS — Z992 Dependence on renal dialysis: Secondary | ICD-10-CM | POA: Diagnosis not present

## 2021-04-12 DIAGNOSIS — D689 Coagulation defect, unspecified: Secondary | ICD-10-CM | POA: Diagnosis not present

## 2021-04-12 DIAGNOSIS — D509 Iron deficiency anemia, unspecified: Secondary | ICD-10-CM | POA: Diagnosis not present

## 2021-04-12 DIAGNOSIS — D631 Anemia in chronic kidney disease: Secondary | ICD-10-CM | POA: Diagnosis not present

## 2021-04-14 DIAGNOSIS — N2581 Secondary hyperparathyroidism of renal origin: Secondary | ICD-10-CM | POA: Diagnosis not present

## 2021-04-14 DIAGNOSIS — N186 End stage renal disease: Secondary | ICD-10-CM | POA: Diagnosis not present

## 2021-04-14 DIAGNOSIS — D689 Coagulation defect, unspecified: Secondary | ICD-10-CM | POA: Diagnosis not present

## 2021-04-14 DIAGNOSIS — Z992 Dependence on renal dialysis: Secondary | ICD-10-CM | POA: Diagnosis not present

## 2021-04-14 DIAGNOSIS — D509 Iron deficiency anemia, unspecified: Secondary | ICD-10-CM | POA: Diagnosis not present

## 2021-04-14 DIAGNOSIS — D631 Anemia in chronic kidney disease: Secondary | ICD-10-CM | POA: Diagnosis not present

## 2021-04-16 DIAGNOSIS — I129 Hypertensive chronic kidney disease with stage 1 through stage 4 chronic kidney disease, or unspecified chronic kidney disease: Secondary | ICD-10-CM | POA: Diagnosis not present

## 2021-04-16 DIAGNOSIS — N186 End stage renal disease: Secondary | ICD-10-CM | POA: Diagnosis not present

## 2021-04-16 DIAGNOSIS — Z992 Dependence on renal dialysis: Secondary | ICD-10-CM | POA: Diagnosis not present

## 2021-04-17 DIAGNOSIS — D689 Coagulation defect, unspecified: Secondary | ICD-10-CM | POA: Diagnosis not present

## 2021-04-17 DIAGNOSIS — N186 End stage renal disease: Secondary | ICD-10-CM | POA: Diagnosis not present

## 2021-04-17 DIAGNOSIS — Z992 Dependence on renal dialysis: Secondary | ICD-10-CM | POA: Diagnosis not present

## 2021-04-17 DIAGNOSIS — N2581 Secondary hyperparathyroidism of renal origin: Secondary | ICD-10-CM | POA: Diagnosis not present

## 2021-04-19 DIAGNOSIS — N2581 Secondary hyperparathyroidism of renal origin: Secondary | ICD-10-CM | POA: Diagnosis not present

## 2021-04-19 DIAGNOSIS — Z992 Dependence on renal dialysis: Secondary | ICD-10-CM | POA: Diagnosis not present

## 2021-04-19 DIAGNOSIS — N186 End stage renal disease: Secondary | ICD-10-CM | POA: Diagnosis not present

## 2021-04-19 DIAGNOSIS — D689 Coagulation defect, unspecified: Secondary | ICD-10-CM | POA: Diagnosis not present

## 2021-04-21 DIAGNOSIS — Z992 Dependence on renal dialysis: Secondary | ICD-10-CM | POA: Diagnosis not present

## 2021-04-21 DIAGNOSIS — D689 Coagulation defect, unspecified: Secondary | ICD-10-CM | POA: Diagnosis not present

## 2021-04-21 DIAGNOSIS — N2581 Secondary hyperparathyroidism of renal origin: Secondary | ICD-10-CM | POA: Diagnosis not present

## 2021-04-21 DIAGNOSIS — N186 End stage renal disease: Secondary | ICD-10-CM | POA: Diagnosis not present

## 2021-04-23 DIAGNOSIS — H5213 Myopia, bilateral: Secondary | ICD-10-CM | POA: Diagnosis not present

## 2021-04-23 DIAGNOSIS — H25813 Combined forms of age-related cataract, bilateral: Secondary | ICD-10-CM | POA: Diagnosis not present

## 2021-04-24 DIAGNOSIS — N186 End stage renal disease: Secondary | ICD-10-CM | POA: Diagnosis not present

## 2021-04-24 DIAGNOSIS — D689 Coagulation defect, unspecified: Secondary | ICD-10-CM | POA: Diagnosis not present

## 2021-04-24 DIAGNOSIS — E1122 Type 2 diabetes mellitus with diabetic chronic kidney disease: Secondary | ICD-10-CM | POA: Diagnosis not present

## 2021-04-24 DIAGNOSIS — Z992 Dependence on renal dialysis: Secondary | ICD-10-CM | POA: Diagnosis not present

## 2021-04-24 DIAGNOSIS — N2581 Secondary hyperparathyroidism of renal origin: Secondary | ICD-10-CM | POA: Diagnosis not present

## 2021-04-26 DIAGNOSIS — D689 Coagulation defect, unspecified: Secondary | ICD-10-CM | POA: Diagnosis not present

## 2021-04-26 DIAGNOSIS — N2581 Secondary hyperparathyroidism of renal origin: Secondary | ICD-10-CM | POA: Diagnosis not present

## 2021-04-26 DIAGNOSIS — N186 End stage renal disease: Secondary | ICD-10-CM | POA: Diagnosis not present

## 2021-04-26 DIAGNOSIS — E1122 Type 2 diabetes mellitus with diabetic chronic kidney disease: Secondary | ICD-10-CM | POA: Diagnosis not present

## 2021-04-26 DIAGNOSIS — Z992 Dependence on renal dialysis: Secondary | ICD-10-CM | POA: Diagnosis not present

## 2021-04-28 DIAGNOSIS — N2581 Secondary hyperparathyroidism of renal origin: Secondary | ICD-10-CM | POA: Diagnosis not present

## 2021-04-28 DIAGNOSIS — E1122 Type 2 diabetes mellitus with diabetic chronic kidney disease: Secondary | ICD-10-CM | POA: Diagnosis not present

## 2021-04-28 DIAGNOSIS — N186 End stage renal disease: Secondary | ICD-10-CM | POA: Diagnosis not present

## 2021-04-28 DIAGNOSIS — D689 Coagulation defect, unspecified: Secondary | ICD-10-CM | POA: Diagnosis not present

## 2021-04-28 DIAGNOSIS — Z992 Dependence on renal dialysis: Secondary | ICD-10-CM | POA: Diagnosis not present

## 2021-05-01 DIAGNOSIS — N186 End stage renal disease: Secondary | ICD-10-CM | POA: Diagnosis not present

## 2021-05-01 DIAGNOSIS — D689 Coagulation defect, unspecified: Secondary | ICD-10-CM | POA: Diagnosis not present

## 2021-05-01 DIAGNOSIS — Z992 Dependence on renal dialysis: Secondary | ICD-10-CM | POA: Diagnosis not present

## 2021-05-01 DIAGNOSIS — N2581 Secondary hyperparathyroidism of renal origin: Secondary | ICD-10-CM | POA: Diagnosis not present

## 2021-05-03 DIAGNOSIS — Z992 Dependence on renal dialysis: Secondary | ICD-10-CM | POA: Diagnosis not present

## 2021-05-03 DIAGNOSIS — N2581 Secondary hyperparathyroidism of renal origin: Secondary | ICD-10-CM | POA: Diagnosis not present

## 2021-05-03 DIAGNOSIS — D689 Coagulation defect, unspecified: Secondary | ICD-10-CM | POA: Diagnosis not present

## 2021-05-03 DIAGNOSIS — N186 End stage renal disease: Secondary | ICD-10-CM | POA: Diagnosis not present

## 2021-05-05 ENCOUNTER — Other Ambulatory Visit: Payer: Self-pay | Admitting: Family Medicine

## 2021-05-05 DIAGNOSIS — D689 Coagulation defect, unspecified: Secondary | ICD-10-CM | POA: Diagnosis not present

## 2021-05-05 DIAGNOSIS — N2581 Secondary hyperparathyroidism of renal origin: Secondary | ICD-10-CM | POA: Diagnosis not present

## 2021-05-05 DIAGNOSIS — N186 End stage renal disease: Secondary | ICD-10-CM | POA: Diagnosis not present

## 2021-05-05 DIAGNOSIS — Z992 Dependence on renal dialysis: Secondary | ICD-10-CM | POA: Diagnosis not present

## 2021-05-08 DIAGNOSIS — D689 Coagulation defect, unspecified: Secondary | ICD-10-CM | POA: Diagnosis not present

## 2021-05-08 DIAGNOSIS — Z992 Dependence on renal dialysis: Secondary | ICD-10-CM | POA: Diagnosis not present

## 2021-05-08 DIAGNOSIS — N186 End stage renal disease: Secondary | ICD-10-CM | POA: Diagnosis not present

## 2021-05-08 DIAGNOSIS — N2581 Secondary hyperparathyroidism of renal origin: Secondary | ICD-10-CM | POA: Diagnosis not present

## 2021-05-11 DIAGNOSIS — D689 Coagulation defect, unspecified: Secondary | ICD-10-CM | POA: Diagnosis not present

## 2021-05-11 DIAGNOSIS — Z992 Dependence on renal dialysis: Secondary | ICD-10-CM | POA: Diagnosis not present

## 2021-05-11 DIAGNOSIS — N2581 Secondary hyperparathyroidism of renal origin: Secondary | ICD-10-CM | POA: Diagnosis not present

## 2021-05-11 DIAGNOSIS — N186 End stage renal disease: Secondary | ICD-10-CM | POA: Diagnosis not present

## 2021-05-13 DIAGNOSIS — Z992 Dependence on renal dialysis: Secondary | ICD-10-CM | POA: Diagnosis not present

## 2021-05-13 DIAGNOSIS — N186 End stage renal disease: Secondary | ICD-10-CM | POA: Diagnosis not present

## 2021-05-13 DIAGNOSIS — N2581 Secondary hyperparathyroidism of renal origin: Secondary | ICD-10-CM | POA: Diagnosis not present

## 2021-05-13 DIAGNOSIS — D689 Coagulation defect, unspecified: Secondary | ICD-10-CM | POA: Diagnosis not present

## 2021-05-14 ENCOUNTER — Ambulatory Visit (INDEPENDENT_AMBULATORY_CARE_PROVIDER_SITE_OTHER): Payer: Medicare HMO

## 2021-05-14 DIAGNOSIS — I429 Cardiomyopathy, unspecified: Secondary | ICD-10-CM | POA: Diagnosis not present

## 2021-05-15 DIAGNOSIS — D689 Coagulation defect, unspecified: Secondary | ICD-10-CM | POA: Diagnosis not present

## 2021-05-15 DIAGNOSIS — N186 End stage renal disease: Secondary | ICD-10-CM | POA: Diagnosis not present

## 2021-05-15 DIAGNOSIS — N2581 Secondary hyperparathyroidism of renal origin: Secondary | ICD-10-CM | POA: Diagnosis not present

## 2021-05-15 DIAGNOSIS — Z992 Dependence on renal dialysis: Secondary | ICD-10-CM | POA: Diagnosis not present

## 2021-05-16 DIAGNOSIS — I129 Hypertensive chronic kidney disease with stage 1 through stage 4 chronic kidney disease, or unspecified chronic kidney disease: Secondary | ICD-10-CM | POA: Diagnosis not present

## 2021-05-16 DIAGNOSIS — N186 End stage renal disease: Secondary | ICD-10-CM | POA: Diagnosis not present

## 2021-05-16 DIAGNOSIS — Z992 Dependence on renal dialysis: Secondary | ICD-10-CM | POA: Diagnosis not present

## 2021-05-17 DIAGNOSIS — N2581 Secondary hyperparathyroidism of renal origin: Secondary | ICD-10-CM | POA: Diagnosis not present

## 2021-05-17 DIAGNOSIS — Z992 Dependence on renal dialysis: Secondary | ICD-10-CM | POA: Diagnosis not present

## 2021-05-17 DIAGNOSIS — N186 End stage renal disease: Secondary | ICD-10-CM | POA: Diagnosis not present

## 2021-05-17 DIAGNOSIS — D689 Coagulation defect, unspecified: Secondary | ICD-10-CM | POA: Diagnosis not present

## 2021-05-17 LAB — CUP PACEART REMOTE DEVICE CHECK
Battery Remaining Longevity: 156 mo
Battery Remaining Percentage: 100 %
Brady Statistic RA Percent Paced: 2 %
Brady Statistic RV Percent Paced: 1 %
Date Time Interrogation Session: 20221201050900
HighPow Impedance: 42 Ohm
Implantable Lead Implant Date: 20091218
Implantable Lead Implant Date: 20091218
Implantable Lead Location: 753859
Implantable Lead Location: 753860
Implantable Lead Model: 158
Implantable Lead Serial Number: 131093
Implantable Pulse Generator Implant Date: 20220523
Lead Channel Impedance Value: 388 Ohm
Lead Channel Impedance Value: 451 Ohm
Lead Channel Setting Pacing Amplitude: 2 V
Lead Channel Setting Pacing Amplitude: 2.4 V
Lead Channel Setting Pacing Pulse Width: 0.4 ms
Lead Channel Setting Sensing Sensitivity: 0.5 mV
Pulse Gen Serial Number: 232629

## 2021-05-19 DIAGNOSIS — Z992 Dependence on renal dialysis: Secondary | ICD-10-CM | POA: Diagnosis not present

## 2021-05-19 DIAGNOSIS — N2581 Secondary hyperparathyroidism of renal origin: Secondary | ICD-10-CM | POA: Diagnosis not present

## 2021-05-19 DIAGNOSIS — N186 End stage renal disease: Secondary | ICD-10-CM | POA: Diagnosis not present

## 2021-05-19 DIAGNOSIS — D689 Coagulation defect, unspecified: Secondary | ICD-10-CM | POA: Diagnosis not present

## 2021-05-21 NOTE — Progress Notes (Signed)
Chronic Care Management Pharmacy Note  05/23/2021 Name:  Tyler Hahn MRN:  [id number] DOB:  21-Jul-1940  Recommendations/Changes made from today's visit:  Patient's son mentions he has noticed decline in [facility] memory.  He asked if there was anything else we could do for this.  We discussed possibility of increased dose of donepezil.  He is tolerating well with no signs of GI adverse effects.   Subjective: Tyler Hahn is an 80 y.o. year old male who is a primary patient of Tabori, Aundra Millet, MD.  The CCM team was consulted for assistance with disease management and care coordination needs.    Engaged with patient by telephone for follow up visit in response to provider referral for pharmacy case management and/or care coordination services. Spoke with patient's son, [name].    Consent to Services:  The patient was given information about Chronic Care Management services, agreed to services, and gave verbal consent prior to initiation of services.  Please see initial visit note for detailed documentation.   Patient Care Team: [name], MD as PCP - Smitty Knudsen, MD as Consulting Physician (Cardiology) Renato Shin, MD as Consulting Physician (Endocrinology) Ladene Artist, MD as Consulting Physician (Gastroenterology) Rosita Fire, MD as Consulting Physician (Nephrology) Edythe Clarity, [facility] (Pharmacist)  Objective:  Lab Results  Component Value Date   CREATININE 6.57 [facility]) 02/26/2021   CREATININE 6.94 ([place]) 10/16/2020   CREATININE 4.79 (H) 09/20/2020    Lab Results  Component Value Date   HGBA1C 5.8 02/26/2021   Last diabetic Eye exam:  Lab Results  Component Value Date/Time   HMDIABEYEEXA No Retinopathy 08/25/2016 12:00 AM    Last diabetic Foot exam: No results found for: HMDIABFOOTEX      Component Value Date/Time   CHOL 169 02/26/2021 1042   TRIG 151.0 (H) 02/26/2021 1042   HDL 48.10 02/26/2021 1042   CHOLHDL 4  02/26/2021 1042   VLDL 30.2 02/26/2021 1042   LDLCALC 91 02/26/2021 1042   [place] 83 07/04/2017 1555   LDLDIRECT 97.0 06/18/2018 1531    Hepatic Function Latest Ref Rng & Units 02/26/2021 10/16/2020 09/11/2019  Total Protein 6.0 - 8.3 g/dL 7.5 7.1 6.7  Albumin 3.5 - 5.2 g/dL 4.3 4.1 3.1(L)  AST 0 - 37 U/L 15 15 33  ALT 0 - 53 U/L _0 Alk Phosphatase 39 - 117 U/L 68 69 45  Total Bilirubin 0.2 - 1.2 mg/dL 0.4 0.5 0.8  Bilirubin, Direct 0.0 - 0.3 mg/dL 0.1 0.1 -    Lab Results  Component Value Date/Time   TSH 3.32 02/26/2021 10:42 AM   TSH 3.32 10/16/2020 09:49 AM   FREET4 0.91 08/30/2019 04:02 PM   FREET4 0.68 09/18/2015 01:28 PM    CBC Latest Ref Rng & Units 02/26/2021 10/16/2020 09/20/2020  WBC 4.0 - 10.5 K/uL 4.7 4.1 5.1  Hemoglobin 13.0 - 17.0 g/dL 10.7(L) 11.5(L) 12.2(L)  Hematocrit 39.0 - 52.0 % 33.1(L) 36.0(L) 37.7  Platelets 150.0 - 400.0 K/uL 135.0(L) 98.0(L) 109(L)    Lab Results  Component Value Date/Time   VD25OH 80.44 03/24/2014 03:41 PM   VD25OH 10 (L) 07/07/2013 02:17 PM    Clinical ASCVD:  The 10-year ASCVD risk score (Arnett DK, et al., 2019) is: 24.7%   Values used to calculate the score:     Age: 38 years     Sex: Male     Is Non-Hispanic African American: Yes     Diabetic:  Yes     Tobacco smoker: No     Systolic Blood Pressure: 749 mmHg     Is BP treated: No     HDL Cholesterol: 48.1 mg/dL     Total Cholesterol: 169 mg/dL    Other: (CHADS2VASc if Afib, PHQ9 if depression, MMRC or CAT for COPD, ACT, DEXA)  Social History   Tobacco Use  Smoking Status Former   Types: Cigarettes   Quit date: 11/26/1998   Years since quitting: 22.5  Smokeless Tobacco Never   BP Readings from Last 3 Encounters:  03/14/21 122/70  02/26/21 122/70  11/24/20 100/60   Pulse Readings from Last 3 Encounters:  03/14/21 66  02/26/21 74  11/24/20 73   Wt Readings from Last 3 Encounters:  03/14/21 165 lb (74.8 kg)  02/26/21 172 lb 3.2 oz (78.1 kg)  11/24/20  172 lb 9.6 oz (78.3 kg)    Assessment: Review of patient past medical history, allergies, medications, health status, including review of consultants reports, laboratory and other test data, was performed as part of comprehensive evaluation and provision of chronic care management services.   SDOH:  (Social Determinants of Health) assessments and interventions performed:    CCM Care Plan  Allergies  Allergen Reactions   Sulfa Antibiotics Itching and Rash   Sulfonamide Derivatives Itching and Rash    Medications Reviewed Today     Reviewed by Edythe Clarity, [facility] (Pharmacist) on 05/23/21 at 1343  Med List Status: <None>   Medication Order Taking? Sig Documenting Provider Last Dose Status Informant  allopurinol (ZYLOPRIM) 100 MG tablet [id number] Yes 1 tab po 3x/week [name], MD Taking Active   atorvastatin (LIPITOR) 20 MG tablet [id number] Yes TAKE 1 TABLET BY MOUTH EVERY DAY [name], MD Taking Active   donepezil (ARICEPT) 5 MG tablet [id number] Yes TAKE 1 TABLET BY MOUTH EVERYDAY AT BEDTIME [name], MD Taking Active   levothyroxine (SYNTHROID) 25 MCG tablet [id number] Yes TAKE 1 TABLET BY MOUTH BEFORE BREAKFAST [name], MD Taking Active   Multiple Vitamins-Minerals (MULTIVITAMIN WITH MINERALS) tablet [id number] Yes Take 1 tablet by mouth daily. [provider] Taking Active Self  OVER THE COUNTER MEDICATION [id number] Yes Take 1 capsule by mouth daily. 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 03/02/2020   Weakness    ESRD (end stage renal disease) ([place]) 08/30/2019   History of anemia due to CKD 08/18/2019    Hyperkalemia 08/17/2019   Hypothyroid 08/16/2019   Physical exam 02/18/2019   Greater trochanteric bursitis of left hip 08/04/2018   Osteoarthritis 09/25/2016   Screen for colon cancer    Benign neoplasm of transverse colon    Edema 07/28/2014   Adjustment disorder with depressed mood 03/24/2014   Small bowel mass 12/20/2013   Myalgia 07/07/2013   Loss of weight 04/19/2013   Polyarthralgia 04/19/2013   Loss of appetite 04/19/2013   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   Secondary cardiomyopathy ([place]) 81/15/7262   SYSTOLIC HEART FAILURE, CHRONIC 06/07/2010   TESTICULAR MASS 04/04/2010   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[place]) 03/07/2010   Hyperlipidemia 03/07/2010  GOUT 03/07/2010   Essential hypertension 03/07/2010   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    Immunization History  Administered Date(s) Administered   Fluad Quad(high Dose 65+) 02/18/2019, 02/16/2020, 02/26/2021   Influenza Whole 04/04/2010, 06/17/2012   Influenza, High Dose Seasonal PF 03/18/2018   Influenza,inj,Quad PF,6+ Mos 03/24/2014, 05/18/2015, 07/04/2017   Moderna Sars-Covid-2 Vaccination 07/20/2019, 08/21/2019   Pneumococcal Conjugate-13 02/14/2015   Pneumococcal Polysaccharide-23 08/15/2011    Conditions to be addressed/monitored: DMII ESRD CHF (systolic) Hypothyroidism Memory loss   There are no care plans that you recently modified to display for this patient.  Patient Goals/Self-Care Activities Patient will:  - take medications as prescribed  Medication Assistance: None required.  Patient affirms current coverage meets needs. Patient's preferred pharmacy is:  [id number] [place], [place] - [address] AT [facility] [address] [place] [address] Phone: [phone] Fax: [phone]  [id number] [place] [id number]: [phone]: [phone] Uses pill box? Yes  Follow Up:  Patient agrees to Care Plan and Follow-up.  Plan:  6 month rph f/u telephone call or sooner if needed  Future Appointments  Date Time Provider [place] 08/13/2021  7:20 AM [facility] DEVICE REMOTES [facility]  02/11/2022  7:20 AM [facility] DEVICE REMOTES [facility]  05/13/2022  7:20 AM [facility] DEVICE REMOTES [facility]    //

## 2021-05-22 DIAGNOSIS — N2581 Secondary hyperparathyroidism of renal origin: Secondary | ICD-10-CM | POA: Diagnosis not present

## 2021-05-22 DIAGNOSIS — Z992 Dependence on renal dialysis: Secondary | ICD-10-CM | POA: Diagnosis not present

## 2021-05-22 DIAGNOSIS — D689 Coagulation defect, unspecified: Secondary | ICD-10-CM | POA: Diagnosis not present

## 2021-05-22 DIAGNOSIS — N186 End stage renal disease: Secondary | ICD-10-CM | POA: Diagnosis not present

## 2021-05-22 NOTE — Progress Notes (Signed)
Remote ICD transmission.   

## 2021-05-23 ENCOUNTER — Ambulatory Visit: Payer: Medicare HMO | Admitting: Pharmacist

## 2021-05-23 ENCOUNTER — Telehealth: Payer: Self-pay | Admitting: Pharmacist

## 2021-05-23 DIAGNOSIS — E785 Hyperlipidemia, unspecified: Secondary | ICD-10-CM

## 2021-05-23 DIAGNOSIS — R413 Other amnesia: Secondary | ICD-10-CM

## 2021-05-23 MED ORDER — DONEPEZIL HCL 10 MG PO TABS: 10.0000 mg | ORAL_TABLET | Freq: Every day | ORAL | 3 refills | Status: DC

## 2021-05-23 NOTE — Chronic Care Management (AMB) (Signed)
Called in rx for Donepezil 10mg  hs.  Will call patient's so to inform.  Beverly Milch, PharmD Clinical Pharmacist  [facility] [phone]

## 2021-05-23 NOTE — Patient Instructions (Addendum)
Visit Information   Goals Addressed             This Visit's Progress    Manage My Medicine       Timeframe:  Long-Range Goal Priority:  High Start Date:  05/23/21                           Expected End Date:  11/21/21                     Follow Up Date 08/21/21    - call for medicine refill 2 or 3 days before it runs out - call if I am sick and can't take my medicine - keep a list of all the medicines I take; vitamins and herbals too    Why is this important?   These steps will help you keep on track with your medicines.   Notes:        Patient Care Plan: CCM Pharmacy Care Plan     Problem Identified: DMII ESRD CHF (systolic) Hypothyroidism Memory loss   Priority: High     Long-Range Goal: Disease Management   Start Date: 11/22/2020  Expected End Date: 11/22/2021  Recent Progress: On track  Priority: High  Note:   Current Barriers:  Dementia possibly progressing  Pharmacist Clinical Goal(s):  Patient will contact provider office for questions/concerns as evidenced notation of same in electronic health record through collaboration with PharmD and provider.   Interventions: 1:1 collaboration with [name], MD regarding development and update of comprehensive plan of care as evidenced by provider attestation and co-signature Inter-disciplinary care team collaboration (see longitudinal plan of care) Comprehensive medication review performed; medication list updated in electronic medical record  Hyperlipidemia: (LDL goal < 70) -Not ideally controlled  -Cholesterol elevated 10/16/20 otherwise has been well controlled. Possibly due to some inconsistencies in medication administration while patients son was out of town. Atorvastatin IHD dialyzability poor / unlikely to contribute.  -Current treatment: Atorvastatin 20 mg once daily  -Current exercise habits: able to get out and go shopping or for walks with son without issue  -Current meal patterns: consistent  meal pattern including post dialysis. No GI issues following donepezil start per son.  -Educated on Cholesterol goals;  consistent administration  -Counseled on diet and exercise extensively Recommended to continue current medication  Update 05/23/21 Most recent labs were excellent with the exception of kidney numbers which are being handled with dialysis.  Son does mention that he has seen decline in cognitive function and has asked if there is anything we can do with his Aricept. Will consult with PCP on increased dose to Donepezil 10mg  daily. No issues with statin, he is active and still able to get out with son for walks, etc. Continue current medications at this time.  Diabetes (A1c goal <7%) -Controlled -ESRD - Thrice weekly incenter HD -Current medications: No current medications  -Current home glucose readings: not routinely testing due to well controlled. fasting glucose: n/a post prandial glucose: n/a -Denies hypoglycemic/hyperglycemic symptoms -Educated on A1c and blood sugar goals; -Counseled to check feet daily and get yearly eye exams -Recommended to continue current medication  Hyperphosphatemia (goal: ensure tx tolerability and affordability) -Controlled -Current medications: Aurxyia 1 gram three times daily (New) - son reports not taking -Reviewed tolerability - no problems. Son denies price being an issue -Does not appear to be enrolled in patient assistance program -Recommended to continue current medication and  reach back out if cost becomes a concern  Update 05/23/21 Son not sure if he was still taking this - he was going to reach out to dialysis. Not on current medication list - he reports that dialysis mentioned something about him needing this a few weeks ago. He will reach out to dialysis center and let us know if anything to be done on our end.         Patient verbalizes understanding of instructions provided today and agrees to view in [place].   Telephone follow up appointment with pharmacy team member scheduled for: 6 months  Edythe Clarity, [place], PharmD Clinical Pharmacist  [facility] [phone]

## 2021-05-23 NOTE — Telephone Encounter (Signed)
-----   Message from [name], MD sent at 05/23/2021  2:46 PM EST ----- Absolutely.  We started at the lower dose bc of his renal failure but he's on HD so it should be ok.  Thanks for checking! Anda Kraft ----- Message ----- From: Edythe Clarity, [facility] Sent: 05/23/2021   1:59 PM EST To: [name], MD  Dr. Birdie Riddle, Patient's son mentioned in visit today that he has noticed his fathers mental status declining and asked about donepezil.  We discussed possibility of increasing donepezil to 10mg  daily.  Do you feel that would be appropriate for him?

## 2021-05-24 ENCOUNTER — Other Ambulatory Visit: Payer: Self-pay | Admitting: Family Medicine

## 2021-05-24 DIAGNOSIS — Z992 Dependence on renal dialysis: Secondary | ICD-10-CM | POA: Diagnosis not present

## 2021-05-24 DIAGNOSIS — N2581 Secondary hyperparathyroidism of renal origin: Secondary | ICD-10-CM | POA: Diagnosis not present

## 2021-05-24 DIAGNOSIS — D689 Coagulation defect, unspecified: Secondary | ICD-10-CM | POA: Diagnosis not present

## 2021-05-24 DIAGNOSIS — N186 End stage renal disease: Secondary | ICD-10-CM | POA: Diagnosis not present

## 2021-05-26 DIAGNOSIS — N2581 Secondary hyperparathyroidism of renal origin: Secondary | ICD-10-CM | POA: Diagnosis not present

## 2021-05-26 DIAGNOSIS — D689 Coagulation defect, unspecified: Secondary | ICD-10-CM | POA: Diagnosis not present

## 2021-05-26 DIAGNOSIS — Z992 Dependence on renal dialysis: Secondary | ICD-10-CM | POA: Diagnosis not present

## 2021-05-26 DIAGNOSIS — N186 End stage renal disease: Secondary | ICD-10-CM | POA: Diagnosis not present

## 2021-05-29 DIAGNOSIS — N2581 Secondary hyperparathyroidism of renal origin: Secondary | ICD-10-CM | POA: Diagnosis not present

## 2021-05-29 DIAGNOSIS — E1122 Type 2 diabetes mellitus with diabetic chronic kidney disease: Secondary | ICD-10-CM | POA: Diagnosis not present

## 2021-05-29 DIAGNOSIS — D689 Coagulation defect, unspecified: Secondary | ICD-10-CM | POA: Diagnosis not present

## 2021-05-29 DIAGNOSIS — Z992 Dependence on renal dialysis: Secondary | ICD-10-CM | POA: Diagnosis not present

## 2021-05-29 DIAGNOSIS — N186 End stage renal disease: Secondary | ICD-10-CM | POA: Diagnosis not present

## 2021-05-31 DIAGNOSIS — E1122 Type 2 diabetes mellitus with diabetic chronic kidney disease: Secondary | ICD-10-CM | POA: Diagnosis not present

## 2021-05-31 DIAGNOSIS — N186 End stage renal disease: Secondary | ICD-10-CM | POA: Diagnosis not present

## 2021-05-31 DIAGNOSIS — Z992 Dependence on renal dialysis: Secondary | ICD-10-CM | POA: Diagnosis not present

## 2021-05-31 DIAGNOSIS — D689 Coagulation defect, unspecified: Secondary | ICD-10-CM | POA: Diagnosis not present

## 2021-05-31 DIAGNOSIS — N2581 Secondary hyperparathyroidism of renal origin: Secondary | ICD-10-CM | POA: Diagnosis not present

## 2021-06-02 DIAGNOSIS — N2581 Secondary hyperparathyroidism of renal origin: Secondary | ICD-10-CM | POA: Diagnosis not present

## 2021-06-02 DIAGNOSIS — E1122 Type 2 diabetes mellitus with diabetic chronic kidney disease: Secondary | ICD-10-CM | POA: Diagnosis not present

## 2021-06-02 DIAGNOSIS — Z992 Dependence on renal dialysis: Secondary | ICD-10-CM | POA: Diagnosis not present

## 2021-06-02 DIAGNOSIS — N186 End stage renal disease: Secondary | ICD-10-CM | POA: Diagnosis not present

## 2021-06-02 DIAGNOSIS — D689 Coagulation defect, unspecified: Secondary | ICD-10-CM | POA: Diagnosis not present

## 2021-06-05 DIAGNOSIS — D631 Anemia in chronic kidney disease: Secondary | ICD-10-CM | POA: Diagnosis not present

## 2021-06-05 DIAGNOSIS — D689 Coagulation defect, unspecified: Secondary | ICD-10-CM | POA: Diagnosis not present

## 2021-06-05 DIAGNOSIS — N2581 Secondary hyperparathyroidism of renal origin: Secondary | ICD-10-CM | POA: Diagnosis not present

## 2021-06-05 DIAGNOSIS — N186 End stage renal disease: Secondary | ICD-10-CM | POA: Diagnosis not present

## 2021-06-05 DIAGNOSIS — Z992 Dependence on renal dialysis: Secondary | ICD-10-CM | POA: Diagnosis not present

## 2021-06-07 DIAGNOSIS — Z992 Dependence on renal dialysis: Secondary | ICD-10-CM | POA: Diagnosis not present

## 2021-06-07 DIAGNOSIS — N2581 Secondary hyperparathyroidism of renal origin: Secondary | ICD-10-CM | POA: Diagnosis not present

## 2021-06-07 DIAGNOSIS — D689 Coagulation defect, unspecified: Secondary | ICD-10-CM | POA: Diagnosis not present

## 2021-06-07 DIAGNOSIS — N186 End stage renal disease: Secondary | ICD-10-CM | POA: Diagnosis not present

## 2021-06-07 DIAGNOSIS — D631 Anemia in chronic kidney disease: Secondary | ICD-10-CM | POA: Diagnosis not present

## 2021-06-09 DIAGNOSIS — N2581 Secondary hyperparathyroidism of renal origin: Secondary | ICD-10-CM | POA: Diagnosis not present

## 2021-06-09 DIAGNOSIS — N186 End stage renal disease: Secondary | ICD-10-CM | POA: Diagnosis not present

## 2021-06-09 DIAGNOSIS — Z992 Dependence on renal dialysis: Secondary | ICD-10-CM | POA: Diagnosis not present

## 2021-06-09 DIAGNOSIS — D689 Coagulation defect, unspecified: Secondary | ICD-10-CM | POA: Diagnosis not present

## 2021-06-09 DIAGNOSIS — D631 Anemia in chronic kidney disease: Secondary | ICD-10-CM | POA: Diagnosis not present

## 2021-06-12 DIAGNOSIS — N2581 Secondary hyperparathyroidism of renal origin: Secondary | ICD-10-CM | POA: Diagnosis not present

## 2021-06-12 DIAGNOSIS — D689 Coagulation defect, unspecified: Secondary | ICD-10-CM | POA: Diagnosis not present

## 2021-06-12 DIAGNOSIS — N186 End stage renal disease: Secondary | ICD-10-CM | POA: Diagnosis not present

## 2021-06-12 DIAGNOSIS — Z992 Dependence on renal dialysis: Secondary | ICD-10-CM | POA: Diagnosis not present

## 2021-06-14 DIAGNOSIS — N186 End stage renal disease: Secondary | ICD-10-CM | POA: Diagnosis not present

## 2021-06-14 DIAGNOSIS — N2581 Secondary hyperparathyroidism of renal origin: Secondary | ICD-10-CM | POA: Diagnosis not present

## 2021-06-14 DIAGNOSIS — D689 Coagulation defect, unspecified: Secondary | ICD-10-CM | POA: Diagnosis not present

## 2021-06-14 DIAGNOSIS — Z992 Dependence on renal dialysis: Secondary | ICD-10-CM | POA: Diagnosis not present

## 2021-06-16 DIAGNOSIS — D689 Coagulation defect, unspecified: Secondary | ICD-10-CM | POA: Diagnosis not present

## 2021-06-16 DIAGNOSIS — N186 End stage renal disease: Secondary | ICD-10-CM | POA: Diagnosis not present

## 2021-06-16 DIAGNOSIS — N2581 Secondary hyperparathyroidism of renal origin: Secondary | ICD-10-CM | POA: Diagnosis not present

## 2021-06-16 DIAGNOSIS — Z992 Dependence on renal dialysis: Secondary | ICD-10-CM | POA: Diagnosis not present

## 2021-06-16 DIAGNOSIS — I129 Hypertensive chronic kidney disease with stage 1 through stage 4 chronic kidney disease, or unspecified chronic kidney disease: Secondary | ICD-10-CM | POA: Diagnosis not present

## 2021-06-19 DIAGNOSIS — N186 End stage renal disease: Secondary | ICD-10-CM | POA: Diagnosis not present

## 2021-06-19 DIAGNOSIS — D689 Coagulation defect, unspecified: Secondary | ICD-10-CM | POA: Diagnosis not present

## 2021-06-19 DIAGNOSIS — Z992 Dependence on renal dialysis: Secondary | ICD-10-CM | POA: Diagnosis not present

## 2021-06-19 DIAGNOSIS — N2581 Secondary hyperparathyroidism of renal origin: Secondary | ICD-10-CM | POA: Diagnosis not present

## 2021-06-21 DIAGNOSIS — N186 End stage renal disease: Secondary | ICD-10-CM | POA: Diagnosis not present

## 2021-06-21 DIAGNOSIS — D689 Coagulation defect, unspecified: Secondary | ICD-10-CM | POA: Diagnosis not present

## 2021-06-21 DIAGNOSIS — N2581 Secondary hyperparathyroidism of renal origin: Secondary | ICD-10-CM | POA: Diagnosis not present

## 2021-06-21 DIAGNOSIS — Z992 Dependence on renal dialysis: Secondary | ICD-10-CM | POA: Diagnosis not present

## 2021-06-23 DIAGNOSIS — Z992 Dependence on renal dialysis: Secondary | ICD-10-CM | POA: Diagnosis not present

## 2021-06-23 DIAGNOSIS — N2581 Secondary hyperparathyroidism of renal origin: Secondary | ICD-10-CM | POA: Diagnosis not present

## 2021-06-23 DIAGNOSIS — N186 End stage renal disease: Secondary | ICD-10-CM | POA: Diagnosis not present

## 2021-06-23 DIAGNOSIS — D689 Coagulation defect, unspecified: Secondary | ICD-10-CM | POA: Diagnosis not present

## 2021-06-26 DIAGNOSIS — D689 Coagulation defect, unspecified: Secondary | ICD-10-CM | POA: Diagnosis not present

## 2021-06-26 DIAGNOSIS — Z992 Dependence on renal dialysis: Secondary | ICD-10-CM | POA: Diagnosis not present

## 2021-06-26 DIAGNOSIS — N186 End stage renal disease: Secondary | ICD-10-CM | POA: Diagnosis not present

## 2021-06-26 DIAGNOSIS — E1122 Type 2 diabetes mellitus with diabetic chronic kidney disease: Secondary | ICD-10-CM | POA: Diagnosis not present

## 2021-06-26 DIAGNOSIS — N2581 Secondary hyperparathyroidism of renal origin: Secondary | ICD-10-CM | POA: Diagnosis not present

## 2021-06-28 DIAGNOSIS — Z992 Dependence on renal dialysis: Secondary | ICD-10-CM | POA: Diagnosis not present

## 2021-06-28 DIAGNOSIS — E1122 Type 2 diabetes mellitus with diabetic chronic kidney disease: Secondary | ICD-10-CM | POA: Diagnosis not present

## 2021-06-28 DIAGNOSIS — N2581 Secondary hyperparathyroidism of renal origin: Secondary | ICD-10-CM | POA: Diagnosis not present

## 2021-06-28 DIAGNOSIS — D689 Coagulation defect, unspecified: Secondary | ICD-10-CM | POA: Diagnosis not present

## 2021-06-28 DIAGNOSIS — N186 End stage renal disease: Secondary | ICD-10-CM | POA: Diagnosis not present

## 2021-06-30 DIAGNOSIS — N2581 Secondary hyperparathyroidism of renal origin: Secondary | ICD-10-CM | POA: Diagnosis not present

## 2021-06-30 DIAGNOSIS — N186 End stage renal disease: Secondary | ICD-10-CM | POA: Diagnosis not present

## 2021-06-30 DIAGNOSIS — E1122 Type 2 diabetes mellitus with diabetic chronic kidney disease: Secondary | ICD-10-CM | POA: Diagnosis not present

## 2021-06-30 DIAGNOSIS — Z992 Dependence on renal dialysis: Secondary | ICD-10-CM | POA: Diagnosis not present

## 2021-06-30 DIAGNOSIS — D689 Coagulation defect, unspecified: Secondary | ICD-10-CM | POA: Diagnosis not present

## 2021-07-03 ENCOUNTER — Other Ambulatory Visit: Payer: Self-pay | Admitting: Family Medicine

## 2021-07-03 DIAGNOSIS — N2581 Secondary hyperparathyroidism of renal origin: Secondary | ICD-10-CM | POA: Diagnosis not present

## 2021-07-03 DIAGNOSIS — Z992 Dependence on renal dialysis: Secondary | ICD-10-CM | POA: Diagnosis not present

## 2021-07-03 DIAGNOSIS — D689 Coagulation defect, unspecified: Secondary | ICD-10-CM | POA: Diagnosis not present

## 2021-07-03 DIAGNOSIS — N186 End stage renal disease: Secondary | ICD-10-CM | POA: Diagnosis not present

## 2021-07-05 DIAGNOSIS — D689 Coagulation defect, unspecified: Secondary | ICD-10-CM | POA: Diagnosis not present

## 2021-07-05 DIAGNOSIS — Z992 Dependence on renal dialysis: Secondary | ICD-10-CM | POA: Diagnosis not present

## 2021-07-05 DIAGNOSIS — N2581 Secondary hyperparathyroidism of renal origin: Secondary | ICD-10-CM | POA: Diagnosis not present

## 2021-07-05 DIAGNOSIS — N186 End stage renal disease: Secondary | ICD-10-CM | POA: Diagnosis not present

## 2021-07-07 DIAGNOSIS — N2581 Secondary hyperparathyroidism of renal origin: Secondary | ICD-10-CM | POA: Diagnosis not present

## 2021-07-07 DIAGNOSIS — N186 End stage renal disease: Secondary | ICD-10-CM | POA: Diagnosis not present

## 2021-07-07 DIAGNOSIS — D689 Coagulation defect, unspecified: Secondary | ICD-10-CM | POA: Diagnosis not present

## 2021-07-07 DIAGNOSIS — Z992 Dependence on renal dialysis: Secondary | ICD-10-CM | POA: Diagnosis not present

## 2021-07-10 DIAGNOSIS — D689 Coagulation defect, unspecified: Secondary | ICD-10-CM | POA: Diagnosis not present

## 2021-07-10 DIAGNOSIS — N2581 Secondary hyperparathyroidism of renal origin: Secondary | ICD-10-CM | POA: Diagnosis not present

## 2021-07-10 DIAGNOSIS — N186 End stage renal disease: Secondary | ICD-10-CM | POA: Diagnosis not present

## 2021-07-10 DIAGNOSIS — Z992 Dependence on renal dialysis: Secondary | ICD-10-CM | POA: Diagnosis not present

## 2021-07-12 DIAGNOSIS — Z992 Dependence on renal dialysis: Secondary | ICD-10-CM | POA: Diagnosis not present

## 2021-07-12 DIAGNOSIS — N186 End stage renal disease: Secondary | ICD-10-CM | POA: Diagnosis not present

## 2021-07-12 DIAGNOSIS — N2581 Secondary hyperparathyroidism of renal origin: Secondary | ICD-10-CM | POA: Diagnosis not present

## 2021-07-12 DIAGNOSIS — D689 Coagulation defect, unspecified: Secondary | ICD-10-CM | POA: Diagnosis not present

## 2021-07-14 DIAGNOSIS — Z992 Dependence on renal dialysis: Secondary | ICD-10-CM | POA: Diagnosis not present

## 2021-07-14 DIAGNOSIS — N2581 Secondary hyperparathyroidism of renal origin: Secondary | ICD-10-CM | POA: Diagnosis not present

## 2021-07-14 DIAGNOSIS — N186 End stage renal disease: Secondary | ICD-10-CM | POA: Diagnosis not present

## 2021-07-14 DIAGNOSIS — D689 Coagulation defect, unspecified: Secondary | ICD-10-CM | POA: Diagnosis not present

## 2021-07-17 DIAGNOSIS — N186 End stage renal disease: Secondary | ICD-10-CM | POA: Diagnosis not present

## 2021-07-17 DIAGNOSIS — N2581 Secondary hyperparathyroidism of renal origin: Secondary | ICD-10-CM | POA: Diagnosis not present

## 2021-07-17 DIAGNOSIS — I129 Hypertensive chronic kidney disease with stage 1 through stage 4 chronic kidney disease, or unspecified chronic kidney disease: Secondary | ICD-10-CM | POA: Diagnosis not present

## 2021-07-17 DIAGNOSIS — D689 Coagulation defect, unspecified: Secondary | ICD-10-CM | POA: Diagnosis not present

## 2021-07-17 DIAGNOSIS — Z992 Dependence on renal dialysis: Secondary | ICD-10-CM | POA: Diagnosis not present

## 2021-07-18 ENCOUNTER — Telehealth: Payer: Self-pay | Admitting: Pharmacist

## 2021-07-18 NOTE — Progress Notes (Signed)
° ° °  Chronic Care Management Pharmacy Assistant   Name: Tyler Hahn  MRN: [id number] DOB: December 29, 1940   Reason for Encounter: Disease State - General Adherence Call     Recent office visits:  None noted.   Recent consult visits:  None noted.   Hospital visits:  None in previous 6 months  Medications: Outpatient Encounter Medications as of 07/18/2021  Medication Sig   allopurinol (ZYLOPRIM) 100 MG tablet TAKE 1 TABLET BY MOUTH 3 TIMES A WEEK ON MONDAY, WEDNESDAY AND FRIDAY   atorvastatin (LIPITOR) 20 MG tablet TAKE 1 TABLET BY MOUTH EVERY DAY   donepezil (ARICEPT) 10 MG tablet Take 1 tablet (10 mg total) by mouth at bedtime.   levothyroxine (SYNTHROID) 25 MCG tablet TAKE 1 TABLET BY MOUTH BEFORE BREAKFAST   Multiple Vitamins-Minerals (MULTIVITAMIN WITH MINERALS) tablet Take 1 tablet by mouth daily.   OVER THE COUNTER MEDICATION Take 1 capsule by mouth daily. Brain Food   sertraline (ZOLOFT) 100 MG tablet TAKE 1 TABLET BY MOUTH EVERY DAY   No facility-administered encounter medications on file as of 07/18/2021.   Have you had any problems recently with your health? Patients sons reported patient is doing well current and no concerns at this time.   Have you had any problems with your pharmacy? Patient's son denied any concerns or problems with current pharmacy.  What issues or side effects are you having with your medications? Patient's son denied any issues or side effects from current medications.  What would you like me to pass along to [name], CPP for them to help you with?  Patient's son did not have anything to pass along to CPP at this time. He reported patient is doing well.   What can we do to take care of you better? Patient's son had no recommendations at this time.    Care Gaps  AWV: done 10/16/20 Colonoscopy: done 10/03/15 DM Eye Exam: due 08/25/17 DM Foot Exam: due 11/24/21 Microalbumin: unknown  HbgAIC: done 02/26/21 (5.8) DEXA: N/A Mammogram:  N/A   Star Rating Drugs: atorvastatin (LIPITOR) 20 MG tablet - last filled 06/22/21 30 days   Future Appointments  Date Time Provider [place]  08/13/2021  7:20 AM [facility] DEVICE REMOTES [facility]  12/12/2021  3:00 PM [facility] CCM PHARMACIST [facility]  02/11/2022  7:20 AM [facility] DEVICE REMOTES [facility]  05/13/2022  7:20 AM [facility] DEVICE REMOTES [facility]    Jobe Gibbon, [place] Pharmacist Assistant  [phone]

## 2021-07-19 DIAGNOSIS — N2581 Secondary hyperparathyroidism of renal origin: Secondary | ICD-10-CM | POA: Diagnosis not present

## 2021-07-19 DIAGNOSIS — D689 Coagulation defect, unspecified: Secondary | ICD-10-CM | POA: Diagnosis not present

## 2021-07-19 DIAGNOSIS — Z992 Dependence on renal dialysis: Secondary | ICD-10-CM | POA: Diagnosis not present

## 2021-07-19 DIAGNOSIS — N186 End stage renal disease: Secondary | ICD-10-CM | POA: Diagnosis not present

## 2021-07-21 DIAGNOSIS — N186 End stage renal disease: Secondary | ICD-10-CM | POA: Diagnosis not present

## 2021-07-21 DIAGNOSIS — N2581 Secondary hyperparathyroidism of renal origin: Secondary | ICD-10-CM | POA: Diagnosis not present

## 2021-07-21 DIAGNOSIS — D689 Coagulation defect, unspecified: Secondary | ICD-10-CM | POA: Diagnosis not present

## 2021-07-21 DIAGNOSIS — Z992 Dependence on renal dialysis: Secondary | ICD-10-CM | POA: Diagnosis not present

## 2021-07-24 DIAGNOSIS — D689 Coagulation defect, unspecified: Secondary | ICD-10-CM | POA: Diagnosis not present

## 2021-07-24 DIAGNOSIS — N186 End stage renal disease: Secondary | ICD-10-CM | POA: Diagnosis not present

## 2021-07-24 DIAGNOSIS — D509 Iron deficiency anemia, unspecified: Secondary | ICD-10-CM | POA: Diagnosis not present

## 2021-07-24 DIAGNOSIS — Z992 Dependence on renal dialysis: Secondary | ICD-10-CM | POA: Diagnosis not present

## 2021-07-24 DIAGNOSIS — E1122 Type 2 diabetes mellitus with diabetic chronic kidney disease: Secondary | ICD-10-CM | POA: Diagnosis not present

## 2021-07-24 DIAGNOSIS — N2581 Secondary hyperparathyroidism of renal origin: Secondary | ICD-10-CM | POA: Diagnosis not present

## 2021-07-26 DIAGNOSIS — E1122 Type 2 diabetes mellitus with diabetic chronic kidney disease: Secondary | ICD-10-CM | POA: Diagnosis not present

## 2021-07-26 DIAGNOSIS — N2581 Secondary hyperparathyroidism of renal origin: Secondary | ICD-10-CM | POA: Diagnosis not present

## 2021-07-26 DIAGNOSIS — Z992 Dependence on renal dialysis: Secondary | ICD-10-CM | POA: Diagnosis not present

## 2021-07-26 DIAGNOSIS — N186 End stage renal disease: Secondary | ICD-10-CM | POA: Diagnosis not present

## 2021-07-26 DIAGNOSIS — D509 Iron deficiency anemia, unspecified: Secondary | ICD-10-CM | POA: Diagnosis not present

## 2021-07-26 DIAGNOSIS — D689 Coagulation defect, unspecified: Secondary | ICD-10-CM | POA: Diagnosis not present

## 2021-07-28 DIAGNOSIS — D509 Iron deficiency anemia, unspecified: Secondary | ICD-10-CM | POA: Diagnosis not present

## 2021-07-28 DIAGNOSIS — D689 Coagulation defect, unspecified: Secondary | ICD-10-CM | POA: Diagnosis not present

## 2021-07-28 DIAGNOSIS — E1122 Type 2 diabetes mellitus with diabetic chronic kidney disease: Secondary | ICD-10-CM | POA: Diagnosis not present

## 2021-07-28 DIAGNOSIS — N2581 Secondary hyperparathyroidism of renal origin: Secondary | ICD-10-CM | POA: Diagnosis not present

## 2021-07-28 DIAGNOSIS — N186 End stage renal disease: Secondary | ICD-10-CM | POA: Diagnosis not present

## 2021-07-28 DIAGNOSIS — Z992 Dependence on renal dialysis: Secondary | ICD-10-CM | POA: Diagnosis not present

## 2021-07-31 DIAGNOSIS — D689 Coagulation defect, unspecified: Secondary | ICD-10-CM | POA: Diagnosis not present

## 2021-07-31 DIAGNOSIS — N2581 Secondary hyperparathyroidism of renal origin: Secondary | ICD-10-CM | POA: Diagnosis not present

## 2021-07-31 DIAGNOSIS — Z992 Dependence on renal dialysis: Secondary | ICD-10-CM | POA: Diagnosis not present

## 2021-07-31 DIAGNOSIS — N186 End stage renal disease: Secondary | ICD-10-CM | POA: Diagnosis not present

## 2021-08-02 ENCOUNTER — Other Ambulatory Visit: Payer: Self-pay | Admitting: Family Medicine

## 2021-08-02 DIAGNOSIS — N2581 Secondary hyperparathyroidism of renal origin: Secondary | ICD-10-CM | POA: Diagnosis not present

## 2021-08-02 DIAGNOSIS — Z992 Dependence on renal dialysis: Secondary | ICD-10-CM | POA: Diagnosis not present

## 2021-08-02 DIAGNOSIS — D689 Coagulation defect, unspecified: Secondary | ICD-10-CM | POA: Diagnosis not present

## 2021-08-02 DIAGNOSIS — N186 End stage renal disease: Secondary | ICD-10-CM | POA: Diagnosis not present

## 2021-08-03 ENCOUNTER — Telehealth: Payer: Self-pay | Admitting: Family Medicine

## 2021-08-03 ENCOUNTER — Encounter: Payer: Self-pay | Admitting: Family Medicine

## 2021-08-03 ENCOUNTER — Ambulatory Visit (INDEPENDENT_AMBULATORY_CARE_PROVIDER_SITE_OTHER): Payer: Medicare HMO | Admitting: Family Medicine

## 2021-08-03 VITALS — BP 100/60 | HR 72 | Temp 98.8°F | Resp 16 | Wt 168.6 lb

## 2021-08-03 DIAGNOSIS — R600 Localized edema: Secondary | ICD-10-CM

## 2021-08-03 DIAGNOSIS — R413 Other amnesia: Secondary | ICD-10-CM

## 2021-08-03 DIAGNOSIS — R296 Repeated falls: Secondary | ICD-10-CM

## 2021-08-03 DIAGNOSIS — N186 End stage renal disease: Secondary | ICD-10-CM

## 2021-08-03 DIAGNOSIS — E1122 Type 2 diabetes mellitus with diabetic chronic kidney disease: Secondary | ICD-10-CM

## 2021-08-03 DIAGNOSIS — L729 Follicular cyst of the skin and subcutaneous tissue, unspecified: Secondary | ICD-10-CM | POA: Diagnosis not present

## 2021-08-03 LAB — HEPATIC FUNCTION PANEL
ALT: 16 U/L (ref 0–53)
AST: 22 U/L (ref 0–37)
Albumin: 4.5 g/dL (ref 3.5–5.2)
Alkaline Phosphatase: 65 U/L (ref 39–117)
Bilirubin, Direct: 0.1 mg/dL (ref 0.0–0.3)
Total Bilirubin: 0.4 mg/dL (ref 0.2–1.2)
Total Protein: 7.5 g/dL (ref 6.0–8.3)

## 2021-08-03 LAB — CBC WITH DIFFERENTIAL/PLATELET
Basophils Absolute: 0 10*3/uL (ref 0.0–0.1)
Basophils Relative: 0.9 % (ref 0.0–3.0)
Eosinophils Absolute: 0.2 10*3/uL (ref 0.0–0.7)
Eosinophils Relative: 5.1 % — ABNORMAL HIGH (ref 0.0–5.0)
HCT: 34 % — ABNORMAL LOW (ref 39.0–52.0)
Hemoglobin: 11 g/dL — ABNORMAL LOW (ref 13.0–17.0)
Lymphocytes Relative: 24.2 % (ref 12.0–46.0)
Lymphs Abs: 1.1 10*3/uL (ref 0.7–4.0)
MCHC: 32.4 g/dL (ref 30.0–36.0)
MCV: 89.8 fl (ref 78.0–100.0)
Monocytes Absolute: 0.5 10*3/uL (ref 0.1–1.0)
Monocytes Relative: 11.9 % (ref 3.0–12.0)
Neutro Abs: 2.6 10*3/uL (ref 1.4–7.7)
Neutrophils Relative %: 57.9 % (ref 43.0–77.0)
Platelets: 102 10*3/uL — ABNORMAL LOW (ref 150.0–400.0)
RBC: 3.78 Mil/uL — ABNORMAL LOW (ref 4.22–5.81)
RDW: 18.9 % — ABNORMAL HIGH (ref 11.5–15.5)
WBC: 4.5 10*3/uL (ref 4.0–10.5)

## 2021-08-03 LAB — BASIC METABOLIC PANEL
BUN: 31 mg/dL — ABNORMAL HIGH (ref 6–23)
CO2: 34 mEq/L — ABNORMAL HIGH (ref 19–32)
Calcium: 8.9 mg/dL (ref 8.4–10.5)
Chloride: 94 mEq/L — ABNORMAL LOW (ref 96–112)
Creatinine, Ser: 5.01 mg/dL (ref 0.40–1.50)
GFR: 10.37 mL/min — CL (ref >60.00)
Glucose, Bld: 77 mg/dL (ref 70–99)
Potassium: 3.8 mEq/L (ref 3.5–5.1)
Sodium: 136 mEq/L (ref 135–145)

## 2021-08-03 LAB — TSH: TSH: 4.04 u[IU]/mL (ref 0.35–5.50)

## 2021-08-03 LAB — LIPID PANEL
Cholesterol: 183 mg/dL (ref 0–200)
HDL: 48.4 mg/dL (ref >39.00)
NonHDL: 134.78
Total CHOL/HDL Ratio: 4
Triglycerides: 306 mg/dL — ABNORMAL HIGH (ref 0.0–149.0)
VLDL: 61.2 mg/dL — ABNORMAL HIGH (ref 0.0–40.0)

## 2021-08-03 LAB — LDL CHOLESTEROL, DIRECT: Direct LDL: 94 mg/dL

## 2021-08-03 LAB — HEMOGLOBIN A1C: Hgb A1c MFr Bld: 5.7 % (ref 4.6–6.5)

## 2021-08-03 NOTE — Telephone Encounter (Signed)
Call from answering service regarding critical lab results with creatinine 5.01, eGFR 10.37.  Known history of end-stage renal disease, and improved from previous creatinine of 6.57 with eGFR of 7.5 in September 2022.  Phone note routed to PCP as FYI. °

## 2021-08-03 NOTE — Progress Notes (Signed)
° °  Subjective:    Patient ID: Tyler Hahn, male    DOB: May 21, 1941, 81 y.o.   MRN: [id number]  HPI Memory loss- ongoing issue for pt.  Currently on Aricept 10mg  daily.  Pt feels memory is 'pretty good'.  Pt admits that family is concerned.  Sxs seem to be progressively worsening in the last month.  Denies urinary sxs- currently on dialysis.  No fevers.  + cough- intermittently productive.  Caregiver is with him 4-5 days/week.  He is currently in independent living in a community.  Will be moving to a regular apartment and caregiver will be moving in w/ him to provide 24 hr care.  [name] (son) is very involved.  'my feet and legs don't coordinate'- pt reports 2-3 falls in the last 3 weeks.  Caregiver is not aware of falls.  Denies injury, 'i was just sore for a minute'.  Pt denies tripping and states he 'just went down'.  Has 'bad knees'.  Pt reports falls are related to kneecap subluxing.  Pt has not seen anyone regarding knees.  Knot on head- first noticed about 1 week ago.  Not painful.  Has not enlarged or changed in size/shape.  No drainage.  No known injury.   Review of Systems For ROS see HPI   This visit occurred during the SARS-CoV-2 public health emergency.  Safety protocols were in place, including screening questions prior to the visit, additional usage of staff PPE, and extensive cleaning of exam room while observing appropriate contact time as indicated for disinfecting solutions.      Objective:   Physical Exam Vitals reviewed.  Constitutional:      General: He is not in acute distress.    Appearance: Normal appearance. He is not ill-appearing.  HENT:     Head: Normocephalic and atraumatic.  Eyes:     Extraocular Movements: Extraocular movements intact.     Conjunctiva/sclera: Conjunctivae normal.     Pupils: Pupils are equal, round, and reactive to light.  Cardiovascular:     Rate and Rhythm: Normal rate and regular rhythm.     Pulses: Normal pulses.     Heart  sounds: Normal heart sounds.  Pulmonary:     Effort: Pulmonary effort is normal. No respiratory distress.     Breath sounds: Normal breath sounds. No wheezing, rhonchi or rales.  Abdominal:     General: Abdomen is flat. There is no distension.     Palpations: Abdomen is soft.     Tenderness: There is no abdominal tenderness. There is no guarding or rebound.  Musculoskeletal:     Cervical back: Normal range of motion and neck supple. No rigidity.     Right lower leg: No edema.     Left lower leg: No edema.  Skin:    General: Skin is warm and dry.     Comments: <0.5 cm cyst on forehead above R eyebrow  Neurological:     Mental Status: He is alert. Mental status is at baseline.  Psychiatric:        Mood and Affect: Mood normal.        Behavior: Behavior normal.          Assessment & Plan:  Simple cyst- new.  On R forehead above eyebrow.  No TTP.  No erythema or induration.  No fluctuance or drainage.  Very small.  Reviewed benign nature w/ pt and caregiver.

## 2021-08-03 NOTE — Patient Instructions (Addendum)
We'll notify you of your lab results and determine the next steps Home health should call to get things set up The area of concern on his head is a small cyst.  This may come to the surface/pop w/ warm compresses but as long as it's not painful or infected, that's ok If there is no obvious lab cause for his acute change in memory, we can add Namenda to the Aricept that he's already taking Make sure you are eating and drinking regularly Call with any questions or concerns Stay Safe!  Stay Healthy!

## 2021-08-04 DIAGNOSIS — N186 End stage renal disease: Secondary | ICD-10-CM | POA: Diagnosis not present

## 2021-08-04 DIAGNOSIS — D689 Coagulation defect, unspecified: Secondary | ICD-10-CM | POA: Diagnosis not present

## 2021-08-04 DIAGNOSIS — N2581 Secondary hyperparathyroidism of renal origin: Secondary | ICD-10-CM | POA: Diagnosis not present

## 2021-08-04 DIAGNOSIS — Z992 Dependence on renal dialysis: Secondary | ICD-10-CM | POA: Diagnosis not present

## 2021-08-06 ENCOUNTER — Telehealth: Payer: Self-pay | Admitting: Family Medicine

## 2021-08-06 ENCOUNTER — Telehealth: Payer: Self-pay

## 2021-08-06 NOTE — Telephone Encounter (Signed)
Chief Complaint Lab Result (Critical or Stat) Call Type Lab Send to RN Reason for Call Report lab results Initial Comment Caller states she is Hope from [employer] and she has a critical Lab. Translation No Nurse Assessment Nurse: Louie Boston, RN, Barnetta Chapel Date/Time (Eastern Time): 08/03/2021 5:03:20 PM Is there an on-call provider listed? ---Yes Please list name of person reporting value (Lab Employee) and a contact number. ---Ralene Bathe lab Please document the following items: Lab name Lab value (read back to lab to verify) Reference range for lab value Date and time blood was drawn Collect time of birth for bilirubin results ---creatinine is 5.01-critical; GFR is10.37 critical; collected today 112pm; last results 02/26/21 creatinine was 6.57 and GFR was 7.51 Disp. Time Eilene Ghazi Time) Disposition Final User 08/03/2021 5:10:31 PM Called On-Call Provider Louie Boston, RN, Barnetta Chapel 08/03/2021 5:12:47 PM Lab Call Louie Boston, RN, Barnetta Chapel Reason: critical results 08/03/2021 5:12:53 PM Clinical Call Yes Louie Boston, RN, Mamie Nick NOTE: All timestamps contained within this report are represented as [place] Standard Time. CONFIDENTIALTY NOTICE: This fax transmission is intended only for the addressee. It contains information that is legally privileged, confidential or otherwise protected from use or disclosure. If you are not the intended recipient, you are strictly prohibited from reviewing, disclosing, copying using or disseminating any of this information or taking any action in reliance on or regarding this information. If you have received this fax in error, please notify us immediately by telephone so that we can arrange for its return to [place]. Phone: [phone], Toll-Free: [phone], Fax: [phone] Page: 2 of 2 Call Id: [id number] Paging DoctorName Phone DateTime Result/ Outcome Message Type Notes Merri Ray- MD [phone] 08/03/2021 5:10:31 PM Called On Call  Provider - Reached Doctor Paged Merri Ray- MD 08/03/2021 5:12:13 PM Spoke with On Call - General Message Result Notified dr of critical results

## 2021-08-06 NOTE — Assessment & Plan Note (Signed)
New.  Unclear if this actually occurred as caregiver and family were unaware of falls.  He has no injuries at this time.  Will refer for a home safety evaluation and PT evaluation for gait training and HEP.  Will follow.

## 2021-08-06 NOTE — Assessment & Plan Note (Signed)
Deteriorated per pt's caregiver.  She reports this has worsened over the past ~3 weeks.  Given his ESRD, it is hard to know if he has urinary sxs.  They do report a cough so will get CBC to assess for possible infection- although lungs are clear today.  Discussed that this may just be the natural course of his disease progression.  If no metabolic or infectious trigger is found, we can discuss adding Namenda as long as it is not cost prohibitive for the family.

## 2021-08-06 NOTE — Assessment & Plan Note (Signed)
Ongoing issue for pt.  Given his increased state of confusion will check labs to make sure that high sugar levels aren't contributing.

## 2021-08-06 NOTE — Telephone Encounter (Signed)
Spoke to son and informed him. He states patient is doing well. Will call with any other update

## 2021-08-06 NOTE — Assessment & Plan Note (Signed)
Chronic problem for pt.  Minimal swelling today.  We have discussed this before that this is due to his ESRD and his volume status is determined by HD.  I don't see any reason to change his dry weight today.

## 2021-08-06 NOTE — Telephone Encounter (Signed)
-----   Message from [name], MD sent at 08/06/2021  7:30 AM EST ----- Labs are all stable w/ exception of Triglycerides (fatty part of blood) which have increased considerably.  But given the timing of his appt, I suspect he had just eaten lunch.  No evidence of infection or other metabolic cause of his confusion.  Please let me know how his cough is doing b/c if it continues, we need to order a chest xray

## 2021-08-07 DIAGNOSIS — Z992 Dependence on renal dialysis: Secondary | ICD-10-CM | POA: Diagnosis not present

## 2021-08-07 DIAGNOSIS — N186 End stage renal disease: Secondary | ICD-10-CM | POA: Diagnosis not present

## 2021-08-07 DIAGNOSIS — N2581 Secondary hyperparathyroidism of renal origin: Secondary | ICD-10-CM | POA: Diagnosis not present

## 2021-08-07 DIAGNOSIS — D689 Coagulation defect, unspecified: Secondary | ICD-10-CM | POA: Diagnosis not present

## 2021-08-09 DIAGNOSIS — Z992 Dependence on renal dialysis: Secondary | ICD-10-CM | POA: Diagnosis not present

## 2021-08-09 DIAGNOSIS — D689 Coagulation defect, unspecified: Secondary | ICD-10-CM | POA: Diagnosis not present

## 2021-08-09 DIAGNOSIS — N186 End stage renal disease: Secondary | ICD-10-CM | POA: Diagnosis not present

## 2021-08-09 DIAGNOSIS — N2581 Secondary hyperparathyroidism of renal origin: Secondary | ICD-10-CM | POA: Diagnosis not present

## 2021-08-10 ENCOUNTER — Telehealth: Payer: Self-pay

## 2021-08-10 DIAGNOSIS — R419 Unspecified symptoms and signs involving cognitive functions and awareness: Secondary | ICD-10-CM | POA: Diagnosis not present

## 2021-08-10 DIAGNOSIS — E1169 Type 2 diabetes mellitus with other specified complication: Secondary | ICD-10-CM | POA: Diagnosis not present

## 2021-08-10 DIAGNOSIS — N186 End stage renal disease: Secondary | ICD-10-CM | POA: Diagnosis not present

## 2021-08-10 DIAGNOSIS — R296 Repeated falls: Secondary | ICD-10-CM | POA: Diagnosis not present

## 2021-08-10 DIAGNOSIS — L720 Epidermal cyst: Secondary | ICD-10-CM | POA: Diagnosis not present

## 2021-08-10 DIAGNOSIS — Z992 Dependence on renal dialysis: Secondary | ICD-10-CM | POA: Diagnosis not present

## 2021-08-10 DIAGNOSIS — E1122 Type 2 diabetes mellitus with diabetic chronic kidney disease: Secondary | ICD-10-CM | POA: Diagnosis not present

## 2021-08-10 DIAGNOSIS — R609 Edema, unspecified: Secondary | ICD-10-CM | POA: Diagnosis not present

## 2021-08-10 NOTE — Telephone Encounter (Signed)
Caller name:Quran Rowe Robert  (April [place])  On DPR? :Yes  Call back [phone]  Provider they see: Birdie Riddle   Reason for call: 1 x 6 weeks for medication, education, and fall prevention

## 2021-08-11 DIAGNOSIS — D689 Coagulation defect, unspecified: Secondary | ICD-10-CM | POA: Diagnosis not present

## 2021-08-11 DIAGNOSIS — N2581 Secondary hyperparathyroidism of renal origin: Secondary | ICD-10-CM | POA: Diagnosis not present

## 2021-08-11 DIAGNOSIS — N186 End stage renal disease: Secondary | ICD-10-CM | POA: Diagnosis not present

## 2021-08-11 DIAGNOSIS — Z992 Dependence on renal dialysis: Secondary | ICD-10-CM | POA: Diagnosis not present

## 2021-08-13 ENCOUNTER — Ambulatory Visit (INDEPENDENT_AMBULATORY_CARE_PROVIDER_SITE_OTHER): Payer: Medicare HMO

## 2021-08-13 DIAGNOSIS — I429 Cardiomyopathy, unspecified: Secondary | ICD-10-CM

## 2021-08-13 LAB — CUP PACEART REMOTE DEVICE CHECK
Battery Remaining Longevity: 156 mo
Battery Remaining Percentage: 100 %
Brady Statistic RA Percent Paced: 2 %
Brady Statistic RV Percent Paced: 2 %
Date Time Interrogation Session: 20230227043400
HighPow Impedance: 47 Ohm
Implantable Lead Implant Date: 20091218
Implantable Lead Implant Date: 20091218
Implantable Lead Location: 753859
Implantable Lead Location: 753860
Implantable Lead Model: 158
Implantable Lead Serial Number: 131093
Implantable Pulse Generator Implant Date: 20220523
Lead Channel Impedance Value: 426 Ohm
Lead Channel Impedance Value: 549 Ohm
Lead Channel Setting Pacing Amplitude: 2 V
Lead Channel Setting Pacing Amplitude: 2.4 V
Lead Channel Setting Pacing Pulse Width: 0.4 ms
Lead Channel Setting Sensing Sensitivity: 0.5 mV
Pulse Gen Serial Number: 232629

## 2021-08-13 NOTE — Telephone Encounter (Signed)
Verbal orders given  

## 2021-08-13 NOTE — Telephone Encounter (Signed)
Ok to proceed w/ verbal order

## 2021-08-14 DIAGNOSIS — I129 Hypertensive chronic kidney disease with stage 1 through stage 4 chronic kidney disease, or unspecified chronic kidney disease: Secondary | ICD-10-CM | POA: Diagnosis not present

## 2021-08-14 DIAGNOSIS — D689 Coagulation defect, unspecified: Secondary | ICD-10-CM | POA: Diagnosis not present

## 2021-08-14 DIAGNOSIS — Z992 Dependence on renal dialysis: Secondary | ICD-10-CM | POA: Diagnosis not present

## 2021-08-14 DIAGNOSIS — N186 End stage renal disease: Secondary | ICD-10-CM | POA: Diagnosis not present

## 2021-08-14 DIAGNOSIS — N2581 Secondary hyperparathyroidism of renal origin: Secondary | ICD-10-CM | POA: Diagnosis not present

## 2021-08-15 ENCOUNTER — Telehealth: Payer: Self-pay | Admitting: Family Medicine

## 2021-08-15 DIAGNOSIS — Z992 Dependence on renal dialysis: Secondary | ICD-10-CM | POA: Diagnosis not present

## 2021-08-15 DIAGNOSIS — R609 Edema, unspecified: Secondary | ICD-10-CM | POA: Diagnosis not present

## 2021-08-15 DIAGNOSIS — N186 End stage renal disease: Secondary | ICD-10-CM | POA: Diagnosis not present

## 2021-08-15 DIAGNOSIS — L720 Epidermal cyst: Secondary | ICD-10-CM | POA: Diagnosis not present

## 2021-08-15 DIAGNOSIS — E1122 Type 2 diabetes mellitus with diabetic chronic kidney disease: Secondary | ICD-10-CM | POA: Diagnosis not present

## 2021-08-15 DIAGNOSIS — R419 Unspecified symptoms and signs involving cognitive functions and awareness: Secondary | ICD-10-CM | POA: Diagnosis not present

## 2021-08-15 DIAGNOSIS — R296 Repeated falls: Secondary | ICD-10-CM | POA: Diagnosis not present

## 2021-08-15 NOTE — Telephone Encounter (Signed)
Verbal provided to andrew ?

## 2021-08-15 NOTE — Telephone Encounter (Signed)
Mitzi Hansen called in asking for verbal orders to see pt for physical therapy 1x a wk for 7wks. ? ?Ok to LM if no answer on vm  ? ?Please advise  ?

## 2021-08-16 DIAGNOSIS — Z992 Dependence on renal dialysis: Secondary | ICD-10-CM | POA: Diagnosis not present

## 2021-08-16 DIAGNOSIS — N186 End stage renal disease: Secondary | ICD-10-CM | POA: Diagnosis not present

## 2021-08-16 DIAGNOSIS — N2581 Secondary hyperparathyroidism of renal origin: Secondary | ICD-10-CM | POA: Diagnosis not present

## 2021-08-16 DIAGNOSIS — D689 Coagulation defect, unspecified: Secondary | ICD-10-CM | POA: Diagnosis not present

## 2021-08-18 DIAGNOSIS — D689 Coagulation defect, unspecified: Secondary | ICD-10-CM | POA: Diagnosis not present

## 2021-08-18 DIAGNOSIS — N186 End stage renal disease: Secondary | ICD-10-CM | POA: Diagnosis not present

## 2021-08-18 DIAGNOSIS — N2581 Secondary hyperparathyroidism of renal origin: Secondary | ICD-10-CM | POA: Diagnosis not present

## 2021-08-18 DIAGNOSIS — Z992 Dependence on renal dialysis: Secondary | ICD-10-CM | POA: Diagnosis not present

## 2021-08-19 ENCOUNTER — Emergency Department (HOSPITAL_BASED_OUTPATIENT_CLINIC_OR_DEPARTMENT_OTHER): Payer: Medicare HMO

## 2021-08-19 ENCOUNTER — Emergency Department (HOSPITAL_BASED_OUTPATIENT_CLINIC_OR_DEPARTMENT_OTHER)
Admission: EM | Admit: 2021-08-19 | Discharge: 2021-08-19 | Disposition: A | Discharge status: Home / Self Care | Payer: Medicare HMO | Attending: Emergency Medicine | Admitting: Emergency Medicine

## 2021-08-19 ENCOUNTER — Ambulatory Visit (HOSPITAL_COMMUNITY): Admission: EM | Admit: 2021-08-19 | Discharge: 2021-08-19 | Payer: Medicare HMO

## 2021-08-19 ENCOUNTER — Encounter (HOSPITAL_BASED_OUTPATIENT_CLINIC_OR_DEPARTMENT_OTHER): Payer: Self-pay | Admitting: Emergency Medicine

## 2021-08-19 ENCOUNTER — Other Ambulatory Visit: Payer: Self-pay

## 2021-08-19 ENCOUNTER — Encounter (HOSPITAL_COMMUNITY): Payer: Self-pay

## 2021-08-19 DIAGNOSIS — N186 End stage renal disease: Secondary | ICD-10-CM | POA: Diagnosis not present

## 2021-08-19 DIAGNOSIS — E1122 Type 2 diabetes mellitus with diabetic chronic kidney disease: Secondary | ICD-10-CM | POA: Insufficient documentation

## 2021-08-19 DIAGNOSIS — M79644 Pain in right finger(s): Secondary | ICD-10-CM | POA: Insufficient documentation

## 2021-08-19 DIAGNOSIS — R600 Localized edema: Secondary | ICD-10-CM | POA: Diagnosis not present

## 2021-08-19 DIAGNOSIS — L600 Ingrowing nail: Secondary | ICD-10-CM | POA: Diagnosis not present

## 2021-08-19 DIAGNOSIS — L03011 Cellulitis of right finger: Secondary | ICD-10-CM | POA: Diagnosis not present

## 2021-08-19 DIAGNOSIS — Z992 Dependence on renal dialysis: Secondary | ICD-10-CM | POA: Diagnosis not present

## 2021-08-19 DIAGNOSIS — L03019 Cellulitis of unspecified finger: Secondary | ICD-10-CM

## 2021-08-19 MED ORDER — CEPHALEXIN 500 MG PO CAPS: 500.0000 mg | ORAL_CAPSULE | Freq: Two times a day (BID) | ORAL | 0 refills | Status: DC

## 2021-08-19 MED ORDER — LIDOCAINE HCL (PF) 1 % IJ SOLN
10.0000 mL | Freq: Once | INTRAMUSCULAR | Status: AC
  Administered 2021-08-19: 10 mL
  Filled 2021-08-19: qty 10

## 2021-08-19 NOTE — ED Notes (Signed)
Rounded on pt. Pt currently resting in bed with family at bedside. No signs of distress noted.  ? ?Suture tray and suture cart at bedside.  ?

## 2021-08-19 NOTE — ED Triage Notes (Signed)
Pt c/o possible ingrown nail to right 3rd finger.  ?

## 2021-08-19 NOTE — Discharge Instructions (Signed)
As we discussed, your finger abscess was drained today in the ER.  I have given you an antibiotic to take as well to prevent additional infection.  Please take this in its entirety as prescribed.  Also recommend soaking your finger in soap and water every day to ensure adequate drainage.  I have also given you some topical bacitracin to put on the wound.  Follow-up with your primary care doctor in the next few days for continued evaluation and management. ? ?Return if development of any new or worsening symptoms. ?

## 2021-08-19 NOTE — ED Provider Notes (Signed)
?[place] ? ? ? ?CSN: [id number] ?Arrival date & time: 08/19/21  1639 ? ? ?  ? ?History   ?Chief Complaint ?Chief Complaint  ?Patient presents with  ? Hand Pain  ? ? ?HPI ?Tyler Hahn is a 81 y.o. male.  ? ?Patient presents with pain, swelling and tenderness to the right middle finger for 3 days.  Endorses that there was concern about a ingrown nail over the course of 1 week.  Went to a nail salon for grooming 3 days ago, endorses that the ingrown nail was not removed at that time .  Denies fever, chills or drainage.  Has not attempted further treatment of symptoms. ? ?Past Medical History:  ?Diagnosis Date  ? Anxiety   ? Automatic implantable cardioverter-defibrillator in situ   ? greg taylor  ? CHF (congestive heart failure) ([place])   ? 2000  ? Diabetes mellitus   ? no meds  ? Fatty liver   ? Gout   ? "bout 2-3 months ago"-meds helped.  ? Hyperlipidemia   ? Hypertension   ? Hyperthyroidism   ? Kidney cysts   ? PONV (postoperative nausea and vomiting)   ? Renal insufficiency   ? ? ?Patient Active Problem List  ? Diagnosis Date Noted  ? Multiple falls 08/03/2021  ? VT (ventricular tachycardia) 03/12/2021  ? Memory loss 10/16/2020  ? 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 Weakness   ? ESRD (end stage renal disease) ([place]) 08/30/2019  ? History of anemia due to CKD 08/18/2019  ? Hyperkalemia 08/17/2019  ? Hypothyroid 08/16/2019  ? Physical exam 02/18/2019  ? Greater trochanteric bursitis of left hip 08/04/2018  ? Osteoarthritis 09/25/2016  ? Screen for colon cancer   ? Benign neoplasm of transverse colon   ? Edema 07/28/2014  ? Adjustment disorder with depressed mood 03/24/2014  ? Small bowel mass 12/20/2013  ? Myalgia 07/07/2013  ? Loss of weight 04/19/2013  ? Polyarthralgia 04/19/2013  ? Loss of appetite 04/19/2013  ? 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 SYSTOLIC HEART FAILURE, CHRONIC 06/07/2010  ? TESTICULAR MASS 04/04/2010  ? Type  2 diabetes mellitus with ESRD (end-stage renal disease) ([place]) 03/07/2010  ? Hyperlipidemia 03/07/2010  ? GOUT 03/07/2010  ? Essential hypertension 03/07/2010  ? 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  ? ? ?Past Surgical History:  ?Procedure Laterality Date  ? AV FISTULA PLACEMENT Right 08/23/2019  ? Procedure: ARTERIOVENOUS GORTEX GRAFT RIGHT ARM;  Surgeon: Rosetta Posner, MD;  Location: [facility];  Service: Vascular;  Laterality: Right;  ? CARDIAC DEFIBRILLATOR PLACEMENT    ? COLONOSCOPY WITH PROPOFOL N/A 10/03/2015  ? Procedure: COLONOSCOPY WITH PROPOFOL;  Surgeon: Ladene Artist, MD;  Location: [facility];  Service: Endoscopy;  Laterality: N/A;  ? DIAGNOSTIC LAPAROSCOPY  01/27/2014  ? Dr Brantley Stage  ? ENTEROSCOPY N/A 11/25/2013  ? Procedure: ENTEROSCOPY;  Surgeon: Ladene Artist, MD;  Location: [facility];  Service: Endoscopy;  Laterality: N/A;  ? ICD GENERATOR CHANGEOUT N/A 11/06/2020  ? Procedure: ICD GENERATOR CHANGEOUT;  Surgeon: [name], MD;  Location: [facility];  Service: Cardiovascular;  Laterality: N/A;  ? ICD,[place] Scentific    ? LAPAROSCOPY N/A 01/27/2014  ? Procedure: LAPAROSCOPY DIAGNOSTIC;  Surgeon: Joyice Faster. Cornett, MD;  Location: [facility];  Service: General;  Laterality: N/A;  ? polyp removal throat    ? difficulty speaking  ? ? ? ? ? ?Home Medications   ? ?  Prior to Admission medications   ?Medication Sig Start Date End Date Taking? Authorizing Provider  ?allopurinol (ZYLOPRIM) 100 MG tablet TAKE 1 TABLET BY MOUTH 3 TIMES A WEEK ON MONDAY, [facility] AND FRIDAY 08/02/21   [name], MD  ?atorvastatin (LIPITOR) 20 MG tablet TAKE 1 TABLET BY MOUTH EVERY DAY 04/04/21   [name], MD  ?donepezil (ARICEPT) 10 MG tablet Take 1 tablet (10 mg total) by mouth at bedtime. 05/23/21   [name], MD  ?levothyroxine (SYNTHROID) 25 MCG tablet TAKE 1 TABLET BY MOUTH BEFORE BREAKFAST 05/07/21   [name], MD  ?Multiple Vitamins-Minerals  (MULTIVITAMIN WITH MINERALS) tablet Take 1 tablet by mouth daily.    [provider]  ?OVER THE COUNTER MEDICATION Take 1 capsule by mouth daily. Brain Food    [provider]  ?sertraline (ZOLOFT) 100 MG tablet TAKE 1 TABLET BY MOUTH EVERY DAY 07/03/21   [name], MD  ? ? ?Family History ?Family History  ?Problem Relation Age of Onset  ? Stomach cancer Mother   ? Alzheimer's disease Mother   ? Diabetes Father   ? Heart disease Father   ? Liver disease Father   ? Kidney disease Father   ? ? ?Social History ?Social History  ? ?Tobacco Use  ? Smoking status: Former  ?  Types: Cigarettes  ?  Quit date: 11/26/1998  ?  Years since quitting: 22.7  ? Smokeless tobacco: Never  ?Vaping Use  ? Vaping Use: Never used  ?Substance Use Topics  ? Alcohol use: No  ?  Alcohol/week: 0.0 standard drinks  ? Drug use: No  ? ? ? ?Allergies   ?Sulfa antibiotics and Sulfonamide derivatives ? ? ?Review of Systems ?Review of Systems ? ? ?Physical Exam ?Triage Vital Signs ?ED Triage Vitals  ?Enc Vitals Group  ?   BP 08/19/21 1707 130/68  ?   Pulse Rate 08/19/21 1707 75  ?   Resp 08/19/21 1707 18  ?   Temp 08/19/21 1707 98.9 ?F (37.2 ?C)  ?   Temp Source 08/19/21 1707 Oral  ?   SpO2 08/19/21 1707 98 %  ?   Weight --   ?   Height --   ?   Head Circumference --   ?   Peak Flow --   ?   Pain Score 08/19/21 1706 8  ?   Pain Loc --   ?   Pain Edu? --   ?   Excl. in [place]? --   ? ?No data found. ? ?Updated Vital Signs ?BP 130/68 (BP Location: Right Arm)   Pulse 75   Temp 98.9 ?F (37.2 ?C) (Oral)   Resp 18   SpO2 98%  ? ?Visual Acuity ?Right Eye Distance:   ?Left Eye Distance:   ?Bilateral Distance:   ? ?Right Eye Near:   ?Left Eye Near:    ?Bilateral Near:    ? ?Physical Exam ? ? ?UC Treatments / Results  ?Labs ?(all labs ordered are listed, but only abnormal results are displayed) ?Labs Reviewed - No data to display ? ?EKG ? ? ?Radiology ?No results found. ? ?Procedures ?Procedures (including critical care  time) ? ?Medications Ordered in UC ?Medications - No data to display ? ?Initial Impression / Assessment and Plan / UC Course  ?I have reviewed the triage vital signs and the nursing notes. ? ?Pertinent labs & imaging results that were available during my care of the patient were reviewed by me  and considered in my medical decision making (see chart for details). ? ?Felon of finger ? ?Patient sent to the nearest emergency department for evaluation of felon and infection of the joint space at the distal phalanx of the right middle finger, to be escorted by family member ?Final Clinical Impressions(s) / UC Diagnoses  ? ?Final diagnoses:  ?None  ? ?Discharge Instructions   ?None ?  ? ?ED Prescriptions   ?None ?  ? ?PDMP not reviewed this encounter. ?  ?Hans [place], NP ?08/19/21 1722 ? ?

## 2021-08-19 NOTE — ED Provider Notes (Signed)
?[place] EMERGENC[facility] DEPT ?Provider Note ? ? ?CSN: [id number] ?Arrival date & time: 08/19/21  1740 ? ?  ? ?History ? ?Chief Complaint  ?Patient presents with  ? Hand Pain  ? ? ?Tyler Hahn is a 81 y.o. male. ? 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 He endorses that 1 week ago he had some concerns about an ingrown fingernail and his son took him to the nail salon 3 days ago and a hangnail was removed. He states that 1 day later he developed swelling to the finger that has been progressively worsening. He was seen at urgent care prior and they sent him here for further evaluation and management. He denies any fevers or chills or sensation changes to the finger. ? ?The history is provided by the patient. No language interpreter was used.  ?Hand Pain ? ? ?  ? ?Home Medications ?Prior to Admission medications   ?Medication Sig Start Date End Date Taking? Authorizing Provider  ?allopurinol (ZYLOPRIM) 100 MG tablet TAKE 1 TABLET BY MOUTH 3 TIMES A WEEK ON MONDAY, [facility] AND FRIDAY 08/02/21   [name], MD  ?atorvastatin (LIPITOR) 20 MG tablet TAKE 1 TABLET BY MOUTH EVERY DAY 04/04/21   [name], MD  ?donepezil (ARICEPT) 10 MG tablet Take 1 tablet (10 mg total) by mouth at bedtime. 05/23/21   [name], MD  ?levothyroxine (SYNTHROID) 25 MCG tablet TAKE 1 TABLET BY MOUTH BEFORE BREAKFAST 05/07/21   [name], MD  ?Multiple Vitamins-Minerals (MULTIVITAMIN WITH MINERALS) tablet Take 1 tablet by mouth daily.    [provider]  ?OVER THE COUNTER MEDICATION Take 1 capsule by mouth daily. Brain Food    [provider]  ?sertraline (ZOLOFT) 100 MG tablet TAKE 1 TABLET BY MOUTH EVERY DAY 07/03/21   [name], MD  ?   ? ?Allergies    ?Sulfa antibiotics and Sulfonamide derivatives   ? ?Review of Systems   ?Review of Systems  ?Constitutional:  Negative  for chills and fever.  ?Skin:  Positive for wound.  ?All other systems reviewed and are negative. ? ?Physical Exam ?Updated Vital Signs ?BP (!) 151/62 (BP Location: Left Arm)   Pulse 75   Temp 98 ?F (36.7 ?C)   Resp 16   Ht 5' 7.75" (1.721 m)   Wt 74.8 kg   SpO2 100%   BMI 25.27 kg/m?  ?Physical Exam ?Vitals and nursing note reviewed.  ?Constitutional:   ?   General: He is not in acute distress. ?   Appearance: Normal appearance. He is normal weight. He is not ill-appearing, toxic-appearing or diaphoretic.  ?   Comments: Patient resting comfortably in bed in no acute distress  ?HENT:  ?   Head: Normocephalic and atraumatic.  ?Cardiovascular:  ?   Rate and Rhythm: Normal rate.  ?Pulmonary:  ?   Effort: Pulmonary effort is normal. No respiratory distress.  ?Musculoskeletal:     ?   General: Normal range of motion.  ?   Cervical back: Normal range of motion.  ?   Comments: Area of fluctuance with surrounding induration to the lateral edge of the right middle finger. Some surrounding swelling and erythema noted. Patient with distal sensation intact, full ROM noted to the finger with pain. Capillary refill less than 2 seconds. Tenderness noted to the area of fluctuance, minimal tenderness noted to the pad of the  finger. No tenderness, swelling, or erythema noted to other fingers or to the hand  ?Skin: ?   General: Skin is warm and dry.  ?Neurological:  ?   General: No focal deficit present.  ?   Mental Status: He is alert.  ?Psychiatric:     ?   Mood and Affect: Mood normal.     ?   Behavior: Behavior normal.  ? ? ?ED Results / Procedures / Treatments   ?Labs ?(all labs ordered are listed, but only abnormal results are displayed) ?Labs Reviewed - No data to display ? ?EKG ?None ? ?Radiology ?DG Finger Middle Right ? ?Result Date: 08/19/2021 ?CLINICAL DATA:  Pain and swelling, ingrown nail EXAM: RIGHT MIDDLE FINGER 2+V COMPARISON:  None. FINDINGS: There is no evidence of fracture or dislocation. There is no evidence  of arthropathy or other focal bone abnormality. Soft tissue edema about the distal long finger. IMPRESSION: No fracture or dislocation of the right long finger. Soft tissue edema about the distal digit. Electronically Signed   By: Delanna Ahmadi M.D.   On: 08/19/2021 18:43   ? ?Procedures ?Drain paronychia ? ?Date/Time: 08/19/2021 8:34 PM ?Performed by: Bud Face, PA-C ?Authorized by: Bud Face, PA-C  ?Consent: Verbal consent obtained. Written consent not obtained. ?Risks and benefits: risks, benefits and alternatives were discussed ?Consent given by: patient ?Patient understanding: patient states understanding of the procedure being performed ?Patient consent: the patient's understanding of the procedure matches consent given ?Procedure consent: procedure consent matches procedure scheduled ?Imaging studies: imaging studies available ?Required items: required blood products, implants, devices, and special equipment available ?Patient identity confirmed: verbally with patient ?Local anesthesia used: yes ?Anesthesia: nerve block ? ?Anesthesia: ?Local anesthesia used: yes ?Local Anesthetic: lidocaine 1% without epinephrine ?Anesthetic total: 5 mL ? ?Sedation: ?Patient sedated: no ? ?Patient tolerance: patient tolerated the procedure well with no immediate complications ? ?  ? ? ?Medications Ordered in ED ?Medications  ?lidocaine (PF) (XYLOCAINE) 1 % injection 10 mL (has no administration in time range)  ? ? ?ED Course/ Medical Decision Making/ A&P ?  ?                        ?Medical Decision Making ?Amount and/or Complexity of Data Reviewed ?Radiology: ordered. ? ?Risk ?Prescription drug management. ? ? ?Patient presents today with paronychia present on his right middle finger. ? ?I, Lavonna Rua, PA-C, personally reviewed and evaluated these image results supported by medical decision making ? ?X-ray imaging of the finger reveals no fracture or dislocation of the right long finger. Soft tissue edema about the  distal digit. I agree with radiologist interpretation. ? ?Upon examination, patient had area of fluctuance noted to the lateral portion of the nail. He was significantly more tender in this area than anywhere else. This was much more significant for paronychia. However, given that patient was sent here for felon drainage from urgent care and he did have some swelling and tenderness to the base of the finger, I did offer to do more invasive technique and attempt felon drainage. Patient declined this and opted for supportive care and watching and waiting with paronychia drainage. This was successfully drained with a significant amount of purulent drainage. He does also have some surrounding cellulitis, therefore will also treat with Keflex. He is stable for discharge at this time, educated on red flag symptoms that would prompt immediate return. Also educated on the importance for following up with his PCP in  the next few days for wound check. Patient is understanding and amenable with plan. Discharged in stable condition. ? ? ?This is a shared visit with supervising physician Dr. Sherry Ruffing who has independently evaluated patient & provided guidance in evaluation/management/disposition, in agreement with care  ? ? ?Final Clinical Impression(s) / ED Diagnoses ?Final diagnoses:  ?Paronychia of finger, right  ? ? ?Rx / DC Orders ?ED Discharge Orders   ? ?      Ordered  ?  cephALEXin (KEFLEX) 500 MG capsule  2 times daily       ? 08/19/21 2037  ? ?  ?  ? ?  ?An After Visit Summary was printed and given to the patient. ? ? ?  ?Bud Face, PA-C ?08/20/21 2223 ? ?  ?Tegeler, Gwenyth Allegra, MD ?08/22/21 1325 ? ?

## 2021-08-19 NOTE — ED Notes (Signed)
Patient is being discharged from the Urgent Care and sent to the Emergency Department via personal vehicle . Per Provider Lowella Petties, patient is in need of higher level of care due to infection in hand. Patient is aware and verbalizes understanding of plan of care.  ? ?Vitals:  ? 08/19/21 1707  ?BP: 130/68  ?Pulse: 75  ?Resp: 18  ?Temp: 98.9 ?F (37.2 ?C)  ?SpO2: 98%  ?  ?

## 2021-08-19 NOTE — ED Triage Notes (Signed)
Pt had ingrown nail to middle finger on right hand Wednesday. Pt went to nail salon on Friday. Pt now has swelling to finger.  ?

## 2021-08-20 ENCOUNTER — Telehealth: Payer: Self-pay

## 2021-08-20 NOTE — Progress Notes (Signed)
Remote ICD transmission.   

## 2021-08-20 NOTE — Telephone Encounter (Signed)
Caller name:Bhavik Rowe Robert ([place] )  ? ?On DPR? :Yes ? ?Call back number:[phone] fax number [phone] ? ?Provider they see: Birdie Riddle  ? ?Reason for call:Rhonda is calling wanting addend from last apt  on 02/17 what home health is needed for? Pt has recent falls, weak need something in detailed ? ?

## 2021-08-20 NOTE — Telephone Encounter (Signed)
Spoke to Tyler Hahn to ask what is needed further. She stated that more specific info is needed pertaining to why he needs home health. I read to her what the visit from 2/17 said and she stated that she needs something more for the nurse. Such as edema, diabetes etc.  ?

## 2021-08-21 ENCOUNTER — Ambulatory Visit: Payer: Medicare HMO | Admitting: Family Medicine

## 2021-08-21 DIAGNOSIS — N186 End stage renal disease: Secondary | ICD-10-CM | POA: Diagnosis not present

## 2021-08-21 DIAGNOSIS — E1122 Type 2 diabetes mellitus with diabetic chronic kidney disease: Secondary | ICD-10-CM | POA: Diagnosis not present

## 2021-08-21 DIAGNOSIS — Z992 Dependence on renal dialysis: Secondary | ICD-10-CM | POA: Diagnosis not present

## 2021-08-21 DIAGNOSIS — D689 Coagulation defect, unspecified: Secondary | ICD-10-CM | POA: Diagnosis not present

## 2021-08-21 DIAGNOSIS — N2581 Secondary hyperparathyroidism of renal origin: Secondary | ICD-10-CM | POA: Diagnosis not present

## 2021-08-21 NOTE — Telephone Encounter (Signed)
Suanne Marker called back with a fax number to fax completed paperwork to [phone] for the detailed visit information  ?

## 2021-08-22 ENCOUNTER — Other Ambulatory Visit: Payer: Self-pay

## 2021-08-22 ENCOUNTER — Encounter (HOSPITAL_BASED_OUTPATIENT_CLINIC_OR_DEPARTMENT_OTHER): Payer: Self-pay | Admitting: Emergency Medicine

## 2021-08-22 ENCOUNTER — Ambulatory Visit (INDEPENDENT_AMBULATORY_CARE_PROVIDER_SITE_OTHER): Payer: Medicare HMO | Admitting: Family Medicine

## 2021-08-22 ENCOUNTER — Emergency Department (HOSPITAL_BASED_OUTPATIENT_CLINIC_OR_DEPARTMENT_OTHER)
Admission: EM | Admit: 2021-08-22 | Discharge: 2021-08-22 | Disposition: A | Discharge status: Home / Self Care | Payer: Medicare HMO | Attending: Emergency Medicine | Admitting: Emergency Medicine

## 2021-08-22 ENCOUNTER — Encounter: Payer: Self-pay | Admitting: Family Medicine

## 2021-08-22 VITALS — BP 102/70 | HR 70 | Temp 98.1°F | Resp 16 | Wt 159.6 lb

## 2021-08-22 DIAGNOSIS — L03011 Cellulitis of right finger: Secondary | ICD-10-CM | POA: Diagnosis not present

## 2021-08-22 DIAGNOSIS — R413 Other amnesia: Secondary | ICD-10-CM | POA: Diagnosis not present

## 2021-08-22 DIAGNOSIS — L03019 Cellulitis of unspecified finger: Secondary | ICD-10-CM

## 2021-08-22 MED ORDER — DOXYCYCLINE HYCLATE 100 MG PO CAPS: 100.0000 mg | ORAL_CAPSULE | Freq: Two times a day (BID) | ORAL | 0 refills | Status: AC

## 2021-08-22 MED ORDER — BACITRACIN ZINC 500 UNIT/GM EX OINT: 1.0000 "application " | TOPICAL_OINTMENT | Freq: Two times a day (BID) | CUTANEOUS | 0 refills | Status: DC

## 2021-08-22 MED ORDER — BACITRACIN ZINC 500 UNIT/GM EX OINT
TOPICAL_OINTMENT | Freq: Once | CUTANEOUS | Status: AC
  Administered 2021-08-22: 1 via TOPICAL
  Filled 2021-08-22: qty 28.35

## 2021-08-22 MED ORDER — LIDOCAINE HCL 2 % IJ SOLN
10.0000 mL | Freq: Once | INTRAMUSCULAR | Status: AC
  Administered 2021-08-22: 200 mg
  Filled 2021-08-22: qty 20

## 2021-08-22 NOTE — ED Triage Notes (Signed)
Still having issues with rt middle finger , saw dr today , pt is still on antiobiotics , but finger is more swollen, saw dr today and was to see hand spec but they saw a pic and was told come here  ?

## 2021-08-22 NOTE — Telephone Encounter (Signed)
Letter printed with qualifying conditions ?

## 2021-08-22 NOTE — Assessment & Plan Note (Signed)
This has seemingly stabilized since last visit.  Son feels that he is doing well w/ exception of his current finger infection (he doesn't remember that this was drained and keeps removing bandage).  No med changes at this time. ?

## 2021-08-22 NOTE — Patient Instructions (Signed)
We'll let you know about your hand specialist appt ?Continue the Keflex twice daily ?Keep finger clean and dry ?Try not to pick or remove bandages ?Call with any questions or concerns ?Hang in there!!! ?

## 2021-08-22 NOTE — Progress Notes (Signed)
? ?  Subjective:  ? ? Patient ID: Tyler Hahn, male    DOB: 1941-05-26, 81 y.o.   MRN: [id number] ? ?HPI ?Paronychia- R middle finger.  Was seen in ED on 3/5.  Infection was drained and a 'significant amount of purulent drainage' was expressed.  He was sent home w/ Keflex.  Son reports area is again swollen.  Is on 3rd day of abx.  No longer draining.  Pt reports finger is 'aching'. ? ?Memory loss- son reports he had been doing pretty well until this finger infection and he 'kept forgetting what he had done'.  Family feels he has been doing better. ? ? ?Review of Systems ?For ROS see HPI  ? ?This visit occurred during the SARS-CoV-2 public health emergency.  Safety protocols were in place, including screening questions prior to the visit, additional usage of staff PPE, and extensive cleaning of exam room while observing appropriate contact time as indicated for disinfecting solutions.   ?   ?Objective:  ? Physical Exam ?Vitals reviewed.  ?Constitutional:   ?   General: He is not in acute distress. ?   Appearance: Normal appearance. He is not ill-appearing.  ?HENT:  ?   Head: Normocephalic and atraumatic.  ?Skin: ?   General: Skin is warm and dry.  ?   Comments: R middle finger w/ small incision along nail edge.  Finger is swollen, taut, TTP particularly at tip of finger.  ?Neurological:  ?   Mental Status: He is alert. Mental status is at baseline.  ?Psychiatric:     ?   Mood and Affect: Mood normal.     ?   Behavior: Behavior normal.  ? ? ? ? ? ?   ?Assessment & Plan:  ? ?Paronychia- deteriorated.  Pt's finger is again swollen and very tender.  Situation has seemingly worsened since ER drainage on 3/5.  He is currently on Keflex BID x10 days but I don't think this will adequately treat this infection- especially given his complicated medical issues.  Given how swollen and taut his finger is, will refer to Hand specialist to determine if more extensive drainage is needed.  Pt and son expressed understanding and  agreement. ?

## 2021-08-22 NOTE — Discharge Instructions (Addendum)
Be sure to soak your finger in warm/soapy water multiple times per day for about 10-15 minutes.  After this apply the topical antibiotics.  We are also prescribing a new antibiotic to take. Stop the Keflex you were given. ? ?Follow-up with the hand specialist on 3/10.  Call their office today to set this up.  If at any point you develop any new or worsening condition or symptoms then return to the ER.  Ideally we would have you return to [facility] to see a hand specialist but she can return to any ER or call 911. ?

## 2021-08-22 NOTE — ED Provider Notes (Signed)
?[place] EMER[facility] DEPT ?Provider Note ? ? ?CSN: [id number] ?Arrival date & time: 08/22/21  1220 ? ?  ? ?History ? ?Chief Complaint  ?Patient presents with  ? Finger Injury  ? ? ?Tyler Hahn is a 81 y.o. male. ? ?HPI ?81 year old male presents with finger infection.  It is right middle finger.  He had a hangnail that seem to get infected.  Was here couple days ago and got a paronychia drainage with some relief.  However he still has pain and swelling to the tip of his finger.  Went to his PCP who try to get him into a hand specialist but the hand specialist referred him back to the emergency department.  Overall the swelling more proximal in his finger seems to be better since antibiotics (Keflex) and the drainage but he is still having palmar finger pain. ? ?Home Medications ?Prior to Admission medications   ?Medication Sig Start Date End Date Taking? Authorizing Provider  ?bacitracin ointment Apply 1 application. topically 2 (two) times daily. 08/22/21  Yes Sherwood Gambler, MD  ?doxycycline (VIBRAMYCIN) 100 MG capsule Take 1 capsule (100 mg total) by mouth 2 (two) times daily for 5 days. 08/22/21 08/27/21 Yes Sherwood Gambler, MD  ?allopurinol (ZYLOPRIM) 100 MG tablet TAKE 1 TABLET BY MOUTH 3 TIMES A WEEK ON MONDAY, [facility] AND FRIDAY 08/02/21   [name], MD  ?atorvastatin (LIPITOR) 20 MG tablet TAKE 1 TABLET BY MOUTH EVERY DAY 04/04/21   [name], MD  ?donepezil (ARICEPT) 10 MG tablet Take 1 tablet (10 mg total) by mouth at bedtime. 05/23/21   [name], MD  ?levothyroxine (SYNTHROID) 25 MCG tablet TAKE 1 TABLET BY MOUTH BEFORE BREAKFAST 05/07/21   [name], MD  ?Multiple Vitamins-Minerals (MULTIVITAMIN WITH MINERALS) tablet Take 1 tablet by mouth daily.    [provider]  ?OVER THE COUNTER MEDICATION Take 1 capsule by mouth daily. Brain Food    [provider]  ?sertraline (ZOLOFT) 100 MG tablet TAKE 1 TABLET BY MOUTH EVERY DAY 07/03/21    [name], MD  ?   ? ?Allergies    ?Sulfa antibiotics and Sulfonamide derivatives   ? ?Review of Systems   ?Review of Systems  ?Skin:  Positive for color change.  ? ?Physical Exam ?Updated Vital Signs ?BP (!) 104/56   Pulse 65   Temp 97.7 ?F (36.5 ?C)   Resp 18   Ht '5\' 7"'$  (1.702 m)   Wt 72.4 kg   SpO2 98%   BMI 25.00 kg/m?  ?Physical Exam ?Vitals and nursing note reviewed.  ?Constitutional:   ?   Appearance: He is well-developed.  ?HENT:  ?   Head: Normocephalic and atraumatic.  ?Pulmonary:  ?   Effort: Pulmonary effort is normal.  ?Musculoskeletal:  ?   Right hand: Swelling and tenderness present.  ?   Comments: The pad of the right middle finger has swelling and tenderness.  Mild erythema.  Has residual hangnail injury though no purulent drainage from this.  Small wound from the paronychia drainage that is not currently draining. ?The middle finger is held in slight flexion (as are others) and thus he cannot fully extend it.  However he can fully flex this finger.  There is no swelling or tenderness proximal to the DIP.  ?Skin: ?   General: Skin is warm and dry.  ?Neurological:  ?   Mental Status: He is alert.  ? ? ?ED Results / Procedures / Treatments   ?  Labs ?(all labs ordered are listed, but only abnormal results are displayed) ?Labs Reviewed - No data to display ? ?EKG ?None ? ?Radiology ?No results found. ? ?Procedures ?Marland Kitchen.Incision and Drainage ? ?Date/Time: 08/22/2021 1:53 PM ?Performed by: Sherwood Gambler, MD ?Authorized by: Sherwood Gambler, MD  ? ?Consent:  ?  Consent obtained:  Verbal ?  Consent given by:  Patient (and son) ?  Risks, benefits, and alternatives were discussed: yes   ?  Risks discussed:  Bleeding, damage to other organs, infection, incomplete drainage and pain ?Location:  ?  Type:  Abscess ?  Size:  1 cm ?  Location:  Upper extremity ?  Upper extremity location:  Finger ?  Finger location:  R long finger ?Pre-procedure details:  ?  Skin preparation:   Povidone-iodine ?Anesthesia:  ?  Anesthesia method:  Local infiltration ?  Local anesthetic:  Lidocaine 1% w/o epi (digital block) ?Procedure type:  ?  Complexity:  Simple ?Procedure details:  ?  Incision types:  Single straight ?  Wound management:  Probed and deloculated ?  Drainage:  Bloody ?  Drainage amount:  Scant ?  Wound treatment:  Wound left open ?Post-procedure details:  ?  Procedure completion:  Tolerated well, no immediate complications ?Comments:  ?   Area of maximum tenderness preanesthesia was incised.  However minimal to no bloody drainage and no pus.  After this I went a little more distal but still no drainage.  ? ? ?Medications Ordered in ED ?Medications  ?bacitracin ointment (has no administration in time range)  ?lidocaine (XYLOCAINE) 2 % (with pres) injection 200 mg (200 mg Infiltration Given by Other 08/22/21 1255)  ? ? ?ED Course/ Medical Decision Making/ A&P ?  ?                        ?Medical Decision Making ?Risk ?OTC drugs. ?Prescription drug management. ? ? ?Clinically patient appears to have a felon.  I marked the area that was maximum and tenderness and swelling.  After verbal consent, digital block, and Betadine, it was incised as above.  However nothing besides a small amount of blood came out.  I tried to open it up a little bit and there was still no obvious drainage.  Possible this is just cellulitis but it seemed fairly convincing for felon.  Thus I went to another location and did another small incision but still no drainage.  At this point I think he will need to aggressively use soap and water soaks, will change his antibiotics from Keflex to doxycycline (he cannot tolerate sulfa drugs) and I did briefly discuss with hand surgery on-call, Dr. Tempie Donning.  He can see him in 2 days.  We will place a picture in the chart to see what it looks like today compared to when they see him.  Otherwise, patient appears stable for discharge home.  Of note, his fingers held in slight flexion  but sore multiple other fingers and this has been a chronic problem and he has no other proximal pain/tenderness that would suggest a worse infection such as a flexor tenosynovitis. ? ? ? ? ? ? ? ?Final Clinical Impression(s) / ED Diagnoses ?Final diagnoses:  ?Felon of finger of right hand  ? ? ?Rx / DC Orders ?ED Discharge Orders   ? ?      Ordered  ?  doxycycline (VIBRAMYCIN) 100 MG capsule  2 times daily       ? 08/22/21 1348  ?  bacitracin ointment  2 times daily       ? 08/22/21 1352  ? ?  ?  ? ?  ? ? ?  ?Sherwood Gambler, MD ?08/22/21 1422 ? ?

## 2021-08-23 DIAGNOSIS — Z992 Dependence on renal dialysis: Secondary | ICD-10-CM | POA: Diagnosis not present

## 2021-08-23 DIAGNOSIS — N186 End stage renal disease: Secondary | ICD-10-CM | POA: Diagnosis not present

## 2021-08-23 DIAGNOSIS — E1122 Type 2 diabetes mellitus with diabetic chronic kidney disease: Secondary | ICD-10-CM | POA: Diagnosis not present

## 2021-08-23 DIAGNOSIS — N2581 Secondary hyperparathyroidism of renal origin: Secondary | ICD-10-CM | POA: Diagnosis not present

## 2021-08-23 DIAGNOSIS — D689 Coagulation defect, unspecified: Secondary | ICD-10-CM | POA: Diagnosis not present

## 2021-08-23 NOTE — Telephone Encounter (Signed)
Letter faxed.

## 2021-08-24 ENCOUNTER — Other Ambulatory Visit: Payer: Self-pay

## 2021-08-24 ENCOUNTER — Encounter: Payer: Self-pay | Admitting: Orthopedic Surgery

## 2021-08-24 ENCOUNTER — Ambulatory Visit (INDEPENDENT_AMBULATORY_CARE_PROVIDER_SITE_OTHER): Payer: Medicare HMO | Admitting: Orthopedic Surgery

## 2021-08-24 DIAGNOSIS — L03019 Cellulitis of unspecified finger: Secondary | ICD-10-CM | POA: Diagnosis not present

## 2021-08-24 DIAGNOSIS — R296 Repeated falls: Secondary | ICD-10-CM | POA: Diagnosis not present

## 2021-08-24 DIAGNOSIS — Z992 Dependence on renal dialysis: Secondary | ICD-10-CM | POA: Diagnosis not present

## 2021-08-24 DIAGNOSIS — L720 Epidermal cyst: Secondary | ICD-10-CM | POA: Diagnosis not present

## 2021-08-24 DIAGNOSIS — N186 End stage renal disease: Secondary | ICD-10-CM | POA: Diagnosis not present

## 2021-08-24 DIAGNOSIS — R419 Unspecified symptoms and signs involving cognitive functions and awareness: Secondary | ICD-10-CM | POA: Diagnosis not present

## 2021-08-24 DIAGNOSIS — R609 Edema, unspecified: Secondary | ICD-10-CM | POA: Diagnosis not present

## 2021-08-24 DIAGNOSIS — E1122 Type 2 diabetes mellitus with diabetic chronic kidney disease: Secondary | ICD-10-CM | POA: Diagnosis not present

## 2021-08-24 NOTE — Progress Notes (Signed)
? ?Office Visit Note ?  ?Patient: Tyler Hahn           ?Date of Birth: January 04, 1941           ?MRN: [id number] ?Visit Date: 08/24/2021 ?             ?Requested by: [name], MD ?[address] ?[place],  [place] [address] ?PCP: [name], MD ? ? ?Assessment & Plan: ?Visit Diagnoses:  ?1. Felon of finger   ? ? ?Plan: Patient presents for ER follow-up of a felon.  Patient and his family note that the finger is much improved since switching from Keflex to doxycycline this week.  He also underwent repeat I&D of the finger in the ER on Wednesday.  Patient still has some soreness but this seems to be improving.  Discussed with patient and son that given his significant symptom improvement since his ER visit, we will continue to monitor him through the weekend and next week.  Discussed doing twice daily soaks of the finger and diluted Hibiclens solution with dry dressing changes.  I will see him back next week if he still having trouble. ? ?Follow-Up Instructions: No follow-ups on file.  ? ?Orders:  ?No orders of the defined types were placed in this encounter. ? ?No orders of the defined types were placed in this encounter. ? ? ? ? Procedures: ?No procedures performed ? ? ?Clinical Data: ?No additional findings. ? ? ?Subjective: ?Chief Complaint  ?Patient presents with  ? Right Middle Finger - New Patient (Initial Visit)  ? ? ?This is a 81 year old right-hand-dominant male who presents for ER follow-up of a right middle finger felon.  He notes that this started approximately 2 weeks ago with a hangnail that got infected.  Seen in the ER where he was thought to have a paronychia and this was opened.  He was put on p.o. Keflex.  He continued to  develop pain in the finger with tense swelling of the middle finger pad.  He was seen in the ER this week where a repeat I&D of the finger pad was performed.  There was minimal purulence obtained but the patient and his son note that his finger is much less  swollen today than it was previously.  He was also switched from Keflex to doxycycline.  Patient and his son are happy with his improvement so far.  He denies any systemic symptoms today. ? ? ?Review of Systems ? ? ?Objective: ?Vital Signs: There were no vitals taken for this visit. ? ?Physical Exam ?Constitutional:   ?   Appearance: Normal appearance.  ?Cardiovascular:  ?   Rate and Rhythm: Normal rate.  ?   Pulses: Normal pulses.  ?Pulmonary:  ?   Effort: Pulmonary effort is normal.  ?Skin: ?   General: Skin is warm and dry.  ?   Capillary Refill: Capillary refill takes less than 2 seconds.  ?Neurological:  ?   Mental Status: He is alert.  ? ? ?Right Hand Exam  ? ?Tenderness  ?Right hand tenderness location: TTP at middle finger pad around incision. ? ?Other  ?Erythema: absent ?Sensation: normal ?Pulse: present ? ?Comments:  No drainage.  Finger pad swollen but not tense.  No erythema.  No TTP along flexor tendon sheath.  ? ? ? ? ?Specialty Comments:  ?No specialty comments available. ? ?Imaging: ?No results found. ? ? ?PMFS History: ?Patient Active Problem List  ? Diagnosis Date Noted  ? Felon  of finger 08/24/2021  ? Multiple falls 08/03/2021  ? VT (ventricular tachycardia) 03/12/2021  ? Memory loss 10/16/2020  ? 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 Weakness   ? ESRD (end stage renal disease) ([facility]) 08/30/2019  ? History of anemia due to CKD 08/18/2019  ? Hyperkalemia 08/17/2019  ? Hypothyroid 08/16/2019  ? Physical exam 02/18/2019  ? Greater trochanteric bursitis of left hip 08/04/2018  ? Osteoarthritis 09/25/2016  ? Screen for colon cancer   ? Benign neoplasm of transverse colon   ? Edema 07/28/2014  ? Adjustment disorder with depressed mood 03/24/2014  ? Small bowel mass 12/20/2013  ? Myalgia 07/07/2013  ? Loss of weight 04/19/2013  ? Polyarthralgia 04/19/2013  ? Loss of appetite 04/19/2013  ? 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 SYSTOLIC  HEART FAILURE, CHRONIC 06/07/2010  ? TESTICULAR MASS 04/04/2010  ? 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[facility]) 03/07/2010  ? Hyperlipidemia 03/07/2010  ? GOUT 03/07/2010  ? Essential hypertension 03/07/2010  ? 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  ? ?Past Medical History:  ?Diagnosis Date  ? Anxiety   ? Automatic implantable cardioverter-defibrillator in situ   ? greg taylor  ? CHF (congestive heart failure) ([facility])   ? 2000  ? Diabetes mellitus   ? no meds  ? Fatty liver   ? Gout   ? "bout 2-3 months ago"-meds helped.  ? Hyperlipidemia   ? Hypertension   ? Hyperthyroidism   ? Kidney cysts   ? PONV (postoperative nausea and vomiting)   ? Renal insufficiency   ?  ?Family History  ?Problem Relation Age of Onset  ? Stomach cancer Mother   ? Alzheimer's disease Mother   ? Diabetes Father   ? Heart disease Father   ? Liver disease Father   ? Kidney disease Father   ?  ?Past Surgical History:  ?Procedure Laterality Date  ? AV FISTULA PLACEMENT Right 08/23/2019  ? Procedure: ARTERIOVENOUS GORTEX GRAFT RIGHT ARM;  Surgeon: Rosetta Posner, MD;  Location: [facility];  Service: Vascular;  Laterality: Right;  ? CARDIAC DEFIBRILLATOR PLACEMENT    ? COLONOSCOPY WITH PROPOFOL N/A 10/03/2015  ? Procedure: COLONOSCOPY WITH PROPOFOL;  Surgeon: Ladene Artist, MD;  [facility]: [facility];  Service: Endoscopy;  Laterality: N/A;  ? DIAGNOSTIC LAPAROSCOPY  01/27/2014  ? Dr Brantley Stage  ? ENTEROSCOPY N/A 11/25/2013  ? Procedure: ENTEROSCOPY;  Surgeon: Ladene Artist, MD;  [facility];  Service: Endoscopy;  Laterality: N/A;  ? ICD GENERATOR CHANGEOUT N/A 11/06/2020  ? Procedure: ICD GENERATOR CHANGEOUT;  Surgeon: [name], MD;  Location: [facility];  Service: Cardiovascular;  Laterality: N/A;  ? ICD,[place] [employer]    ? LAPAROSCOPY N/A 01/27/2014  ? Procedure: LAPAROSCOPY DIAGNOSTIC;  Surgeon: Joyice Faster. Cornett, MD;  Location: [facility];  Service: General;  Laterality: N/A;  ? polyp  removal throat    ? difficulty speaking  ? ?Social History  ? ?Occupational History  ? Occupation: Retired  ?  Employer: RETIRED  ?Tobacco Use  ? Smoking status: Former  ?  Types: Cigarettes  ?  Quit date: 11/26/1998  ?  Years since quitting: 22.7  ? Smokeless tobacco: Never  ?Vaping Use  ? Vaping Use: Never used  ?Substance and Sexual Activity  ? Alcohol use: No  ?  Alcohol/week: 0.0 standard drinks  ? Drug use: No  ? Sexual activity: Not on file  ? ? ? ? ? ? ?

## 2021-08-25 DIAGNOSIS — N186 End stage renal disease: Secondary | ICD-10-CM | POA: Diagnosis not present

## 2021-08-25 DIAGNOSIS — Z992 Dependence on renal dialysis: Secondary | ICD-10-CM | POA: Diagnosis not present

## 2021-08-25 DIAGNOSIS — N2581 Secondary hyperparathyroidism of renal origin: Secondary | ICD-10-CM | POA: Diagnosis not present

## 2021-08-25 DIAGNOSIS — E1122 Type 2 diabetes mellitus with diabetic chronic kidney disease: Secondary | ICD-10-CM | POA: Diagnosis not present

## 2021-08-25 DIAGNOSIS — D689 Coagulation defect, unspecified: Secondary | ICD-10-CM | POA: Diagnosis not present

## 2021-08-28 DIAGNOSIS — Z992 Dependence on renal dialysis: Secondary | ICD-10-CM | POA: Diagnosis not present

## 2021-08-28 DIAGNOSIS — N186 End stage renal disease: Secondary | ICD-10-CM | POA: Diagnosis not present

## 2021-08-28 DIAGNOSIS — N2581 Secondary hyperparathyroidism of renal origin: Secondary | ICD-10-CM | POA: Diagnosis not present

## 2021-08-28 DIAGNOSIS — D689 Coagulation defect, unspecified: Secondary | ICD-10-CM | POA: Diagnosis not present

## 2021-08-29 DIAGNOSIS — I871 Compression of vein: Secondary | ICD-10-CM | POA: Diagnosis not present

## 2021-08-29 DIAGNOSIS — T82858A Stenosis of vascular prosthetic devices, implants and grafts, initial encounter: Secondary | ICD-10-CM | POA: Diagnosis not present

## 2021-08-29 DIAGNOSIS — Z992 Dependence on renal dialysis: Secondary | ICD-10-CM | POA: Diagnosis not present

## 2021-08-29 DIAGNOSIS — N186 End stage renal disease: Secondary | ICD-10-CM | POA: Diagnosis not present

## 2021-08-30 DIAGNOSIS — N186 End stage renal disease: Secondary | ICD-10-CM | POA: Diagnosis not present

## 2021-08-30 DIAGNOSIS — N2581 Secondary hyperparathyroidism of renal origin: Secondary | ICD-10-CM | POA: Diagnosis not present

## 2021-08-30 DIAGNOSIS — D689 Coagulation defect, unspecified: Secondary | ICD-10-CM | POA: Diagnosis not present

## 2021-08-30 DIAGNOSIS — Z992 Dependence on renal dialysis: Secondary | ICD-10-CM | POA: Diagnosis not present

## 2021-08-31 ENCOUNTER — Encounter (HOSPITAL_COMMUNITY): Payer: Self-pay | Admitting: Internal Medicine

## 2021-08-31 DIAGNOSIS — R296 Repeated falls: Secondary | ICD-10-CM | POA: Diagnosis not present

## 2021-08-31 DIAGNOSIS — E1122 Type 2 diabetes mellitus with diabetic chronic kidney disease: Secondary | ICD-10-CM | POA: Diagnosis not present

## 2021-08-31 DIAGNOSIS — R419 Unspecified symptoms and signs involving cognitive functions and awareness: Secondary | ICD-10-CM | POA: Diagnosis not present

## 2021-08-31 DIAGNOSIS — L720 Epidermal cyst: Secondary | ICD-10-CM | POA: Diagnosis not present

## 2021-08-31 DIAGNOSIS — Z992 Dependence on renal dialysis: Secondary | ICD-10-CM | POA: Diagnosis not present

## 2021-08-31 DIAGNOSIS — R609 Edema, unspecified: Secondary | ICD-10-CM | POA: Diagnosis not present

## 2021-08-31 DIAGNOSIS — N186 End stage renal disease: Secondary | ICD-10-CM | POA: Diagnosis not present

## 2021-09-01 DIAGNOSIS — D689 Coagulation defect, unspecified: Secondary | ICD-10-CM | POA: Diagnosis not present

## 2021-09-01 DIAGNOSIS — N2581 Secondary hyperparathyroidism of renal origin: Secondary | ICD-10-CM | POA: Diagnosis not present

## 2021-09-01 DIAGNOSIS — Z992 Dependence on renal dialysis: Secondary | ICD-10-CM | POA: Diagnosis not present

## 2021-09-01 DIAGNOSIS — N186 End stage renal disease: Secondary | ICD-10-CM | POA: Diagnosis not present

## 2021-09-03 DIAGNOSIS — R609 Edema, unspecified: Secondary | ICD-10-CM | POA: Diagnosis not present

## 2021-09-03 DIAGNOSIS — E1122 Type 2 diabetes mellitus with diabetic chronic kidney disease: Secondary | ICD-10-CM | POA: Diagnosis not present

## 2021-09-03 DIAGNOSIS — Z992 Dependence on renal dialysis: Secondary | ICD-10-CM | POA: Diagnosis not present

## 2021-09-03 DIAGNOSIS — L720 Epidermal cyst: Secondary | ICD-10-CM | POA: Diagnosis not present

## 2021-09-03 DIAGNOSIS — R419 Unspecified symptoms and signs involving cognitive functions and awareness: Secondary | ICD-10-CM | POA: Diagnosis not present

## 2021-09-03 DIAGNOSIS — R296 Repeated falls: Secondary | ICD-10-CM | POA: Diagnosis not present

## 2021-09-03 DIAGNOSIS — N186 End stage renal disease: Secondary | ICD-10-CM | POA: Diagnosis not present

## 2021-09-04 ENCOUNTER — Telehealth: Payer: Self-pay

## 2021-09-04 DIAGNOSIS — N2581 Secondary hyperparathyroidism of renal origin: Secondary | ICD-10-CM | POA: Diagnosis not present

## 2021-09-04 DIAGNOSIS — Z992 Dependence on renal dialysis: Secondary | ICD-10-CM | POA: Diagnosis not present

## 2021-09-04 DIAGNOSIS — N186 End stage renal disease: Secondary | ICD-10-CM | POA: Diagnosis not present

## 2021-09-04 DIAGNOSIS — D689 Coagulation defect, unspecified: Secondary | ICD-10-CM | POA: Diagnosis not present

## 2021-09-06 ENCOUNTER — Telehealth: Payer: Self-pay

## 2021-09-06 DIAGNOSIS — N186 End stage renal disease: Secondary | ICD-10-CM | POA: Diagnosis not present

## 2021-09-06 DIAGNOSIS — Z992 Dependence on renal dialysis: Secondary | ICD-10-CM | POA: Diagnosis not present

## 2021-09-06 DIAGNOSIS — N2581 Secondary hyperparathyroidism of renal origin: Secondary | ICD-10-CM | POA: Diagnosis not present

## 2021-09-06 DIAGNOSIS — D689 Coagulation defect, unspecified: Secondary | ICD-10-CM | POA: Diagnosis not present

## 2021-09-06 NOTE — Telephone Encounter (Signed)
Form viewed, placed in folder to sign ?

## 2021-09-06 NOTE — Telephone Encounter (Signed)
Order and Plan of Care placed in bin to be signed from [place]  ?

## 2021-09-07 DIAGNOSIS — L720 Epidermal cyst: Secondary | ICD-10-CM

## 2021-09-07 DIAGNOSIS — E1122 Type 2 diabetes mellitus with diabetic chronic kidney disease: Secondary | ICD-10-CM | POA: Diagnosis not present

## 2021-09-07 DIAGNOSIS — R609 Edema, unspecified: Secondary | ICD-10-CM

## 2021-09-07 DIAGNOSIS — R419 Unspecified symptoms and signs involving cognitive functions and awareness: Secondary | ICD-10-CM

## 2021-09-07 DIAGNOSIS — R296 Repeated falls: Secondary | ICD-10-CM | POA: Diagnosis not present

## 2021-09-07 DIAGNOSIS — N186 End stage renal disease: Secondary | ICD-10-CM | POA: Diagnosis not present

## 2021-09-07 DIAGNOSIS — Z992 Dependence on renal dialysis: Secondary | ICD-10-CM | POA: Diagnosis not present

## 2021-09-07 NOTE — Telephone Encounter (Signed)
Paper work has been faxed

## 2021-09-07 NOTE — Telephone Encounter (Signed)
Form completed and placed in basket  

## 2021-09-07 NOTE — Telephone Encounter (Signed)
Has been faxed.

## 2021-09-08 DIAGNOSIS — N186 End stage renal disease: Secondary | ICD-10-CM | POA: Diagnosis not present

## 2021-09-08 DIAGNOSIS — Z992 Dependence on renal dialysis: Secondary | ICD-10-CM | POA: Diagnosis not present

## 2021-09-08 DIAGNOSIS — N2581 Secondary hyperparathyroidism of renal origin: Secondary | ICD-10-CM | POA: Diagnosis not present

## 2021-09-08 DIAGNOSIS — D689 Coagulation defect, unspecified: Secondary | ICD-10-CM | POA: Diagnosis not present

## 2021-09-10 DIAGNOSIS — E1122 Type 2 diabetes mellitus with diabetic chronic kidney disease: Secondary | ICD-10-CM | POA: Diagnosis not present

## 2021-09-10 DIAGNOSIS — R419 Unspecified symptoms and signs involving cognitive functions and awareness: Secondary | ICD-10-CM | POA: Diagnosis not present

## 2021-09-10 DIAGNOSIS — Z992 Dependence on renal dialysis: Secondary | ICD-10-CM | POA: Diagnosis not present

## 2021-09-10 DIAGNOSIS — R609 Edema, unspecified: Secondary | ICD-10-CM | POA: Diagnosis not present

## 2021-09-10 DIAGNOSIS — R296 Repeated falls: Secondary | ICD-10-CM | POA: Diagnosis not present

## 2021-09-10 DIAGNOSIS — L720 Epidermal cyst: Secondary | ICD-10-CM | POA: Diagnosis not present

## 2021-09-10 DIAGNOSIS — N186 End stage renal disease: Secondary | ICD-10-CM | POA: Diagnosis not present

## 2021-09-11 DIAGNOSIS — Z992 Dependence on renal dialysis: Secondary | ICD-10-CM | POA: Diagnosis not present

## 2021-09-11 DIAGNOSIS — D689 Coagulation defect, unspecified: Secondary | ICD-10-CM | POA: Diagnosis not present

## 2021-09-11 DIAGNOSIS — N186 End stage renal disease: Secondary | ICD-10-CM | POA: Diagnosis not present

## 2021-09-11 DIAGNOSIS — N2581 Secondary hyperparathyroidism of renal origin: Secondary | ICD-10-CM | POA: Diagnosis not present

## 2021-09-13 DIAGNOSIS — D689 Coagulation defect, unspecified: Secondary | ICD-10-CM | POA: Diagnosis not present

## 2021-09-13 DIAGNOSIS — N2581 Secondary hyperparathyroidism of renal origin: Secondary | ICD-10-CM | POA: Diagnosis not present

## 2021-09-13 DIAGNOSIS — Z992 Dependence on renal dialysis: Secondary | ICD-10-CM | POA: Diagnosis not present

## 2021-09-13 DIAGNOSIS — N186 End stage renal disease: Secondary | ICD-10-CM | POA: Diagnosis not present

## 2021-09-14 DIAGNOSIS — Z992 Dependence on renal dialysis: Secondary | ICD-10-CM | POA: Diagnosis not present

## 2021-09-14 DIAGNOSIS — N186 End stage renal disease: Secondary | ICD-10-CM | POA: Diagnosis not present

## 2021-09-14 DIAGNOSIS — I129 Hypertensive chronic kidney disease with stage 1 through stage 4 chronic kidney disease, or unspecified chronic kidney disease: Secondary | ICD-10-CM | POA: Diagnosis not present

## 2021-09-15 DIAGNOSIS — N2581 Secondary hyperparathyroidism of renal origin: Secondary | ICD-10-CM | POA: Diagnosis not present

## 2021-09-15 DIAGNOSIS — N186 End stage renal disease: Secondary | ICD-10-CM | POA: Diagnosis not present

## 2021-09-15 DIAGNOSIS — D689 Coagulation defect, unspecified: Secondary | ICD-10-CM | POA: Diagnosis not present

## 2021-09-15 DIAGNOSIS — Z992 Dependence on renal dialysis: Secondary | ICD-10-CM | POA: Diagnosis not present

## 2021-09-17 DIAGNOSIS — E1122 Type 2 diabetes mellitus with diabetic chronic kidney disease: Secondary | ICD-10-CM | POA: Diagnosis not present

## 2021-09-17 DIAGNOSIS — N186 End stage renal disease: Secondary | ICD-10-CM | POA: Diagnosis not present

## 2021-09-17 DIAGNOSIS — Z992 Dependence on renal dialysis: Secondary | ICD-10-CM | POA: Diagnosis not present

## 2021-09-17 DIAGNOSIS — R296 Repeated falls: Secondary | ICD-10-CM | POA: Diagnosis not present

## 2021-09-17 DIAGNOSIS — L720 Epidermal cyst: Secondary | ICD-10-CM | POA: Diagnosis not present

## 2021-09-17 DIAGNOSIS — R609 Edema, unspecified: Secondary | ICD-10-CM | POA: Diagnosis not present

## 2021-09-17 DIAGNOSIS — R419 Unspecified symptoms and signs involving cognitive functions and awareness: Secondary | ICD-10-CM | POA: Diagnosis not present

## 2021-09-18 DIAGNOSIS — D631 Anemia in chronic kidney disease: Secondary | ICD-10-CM | POA: Diagnosis not present

## 2021-09-18 DIAGNOSIS — D689 Coagulation defect, unspecified: Secondary | ICD-10-CM | POA: Diagnosis not present

## 2021-09-18 DIAGNOSIS — Z992 Dependence on renal dialysis: Secondary | ICD-10-CM | POA: Diagnosis not present

## 2021-09-18 DIAGNOSIS — N2581 Secondary hyperparathyroidism of renal origin: Secondary | ICD-10-CM | POA: Diagnosis not present

## 2021-09-18 DIAGNOSIS — N186 End stage renal disease: Secondary | ICD-10-CM | POA: Diagnosis not present

## 2021-09-20 DIAGNOSIS — N186 End stage renal disease: Secondary | ICD-10-CM | POA: Diagnosis not present

## 2021-09-20 DIAGNOSIS — N2581 Secondary hyperparathyroidism of renal origin: Secondary | ICD-10-CM | POA: Diagnosis not present

## 2021-09-20 DIAGNOSIS — D689 Coagulation defect, unspecified: Secondary | ICD-10-CM | POA: Diagnosis not present

## 2021-09-20 DIAGNOSIS — Z992 Dependence on renal dialysis: Secondary | ICD-10-CM | POA: Diagnosis not present

## 2021-09-20 DIAGNOSIS — D631 Anemia in chronic kidney disease: Secondary | ICD-10-CM | POA: Diagnosis not present

## 2021-09-22 DIAGNOSIS — N2581 Secondary hyperparathyroidism of renal origin: Secondary | ICD-10-CM | POA: Diagnosis not present

## 2021-09-22 DIAGNOSIS — N186 End stage renal disease: Secondary | ICD-10-CM | POA: Diagnosis not present

## 2021-09-22 DIAGNOSIS — D631 Anemia in chronic kidney disease: Secondary | ICD-10-CM | POA: Diagnosis not present

## 2021-09-22 DIAGNOSIS — Z992 Dependence on renal dialysis: Secondary | ICD-10-CM | POA: Diagnosis not present

## 2021-09-22 DIAGNOSIS — D689 Coagulation defect, unspecified: Secondary | ICD-10-CM | POA: Diagnosis not present

## 2021-09-25 DIAGNOSIS — N2581 Secondary hyperparathyroidism of renal origin: Secondary | ICD-10-CM | POA: Diagnosis not present

## 2021-09-25 DIAGNOSIS — Z992 Dependence on renal dialysis: Secondary | ICD-10-CM | POA: Diagnosis not present

## 2021-09-25 DIAGNOSIS — D689 Coagulation defect, unspecified: Secondary | ICD-10-CM | POA: Diagnosis not present

## 2021-09-25 DIAGNOSIS — N186 End stage renal disease: Secondary | ICD-10-CM | POA: Diagnosis not present

## 2021-09-26 DIAGNOSIS — N186 End stage renal disease: Secondary | ICD-10-CM | POA: Diagnosis not present

## 2021-09-26 DIAGNOSIS — R419 Unspecified symptoms and signs involving cognitive functions and awareness: Secondary | ICD-10-CM | POA: Diagnosis not present

## 2021-09-26 DIAGNOSIS — L720 Epidermal cyst: Secondary | ICD-10-CM | POA: Diagnosis not present

## 2021-09-26 DIAGNOSIS — Z992 Dependence on renal dialysis: Secondary | ICD-10-CM | POA: Diagnosis not present

## 2021-09-26 DIAGNOSIS — R296 Repeated falls: Secondary | ICD-10-CM | POA: Diagnosis not present

## 2021-09-26 DIAGNOSIS — R609 Edema, unspecified: Secondary | ICD-10-CM | POA: Diagnosis not present

## 2021-09-26 DIAGNOSIS — E1122 Type 2 diabetes mellitus with diabetic chronic kidney disease: Secondary | ICD-10-CM | POA: Diagnosis not present

## 2021-09-27 DIAGNOSIS — N2581 Secondary hyperparathyroidism of renal origin: Secondary | ICD-10-CM | POA: Diagnosis not present

## 2021-09-27 DIAGNOSIS — N186 End stage renal disease: Secondary | ICD-10-CM | POA: Diagnosis not present

## 2021-09-27 DIAGNOSIS — Z992 Dependence on renal dialysis: Secondary | ICD-10-CM | POA: Diagnosis not present

## 2021-09-27 DIAGNOSIS — D689 Coagulation defect, unspecified: Secondary | ICD-10-CM | POA: Diagnosis not present

## 2021-09-29 ENCOUNTER — Ambulatory Visit (HOSPITAL_COMMUNITY)
Admission: EM | Admit: 2021-09-29 | Discharge: 2021-09-29 | Disposition: A | Discharge status: Home / Self Care | Payer: Medicare HMO | Attending: Emergency Medicine | Admitting: Emergency Medicine

## 2021-09-29 DIAGNOSIS — H5789 Other specified disorders of eye and adnexa: Secondary | ICD-10-CM | POA: Diagnosis not present

## 2021-09-29 DIAGNOSIS — R21 Rash and other nonspecific skin eruption: Secondary | ICD-10-CM

## 2021-09-29 DIAGNOSIS — D689 Coagulation defect, unspecified: Secondary | ICD-10-CM | POA: Diagnosis not present

## 2021-09-29 DIAGNOSIS — N186 End stage renal disease: Secondary | ICD-10-CM | POA: Diagnosis not present

## 2021-09-29 DIAGNOSIS — Z992 Dependence on renal dialysis: Secondary | ICD-10-CM | POA: Diagnosis not present

## 2021-09-29 DIAGNOSIS — N2581 Secondary hyperparathyroidism of renal origin: Secondary | ICD-10-CM | POA: Diagnosis not present

## 2021-09-29 MED ORDER — POLYMYXIN B-TRIMETHOPRIM 10000-0.1 UNIT/ML-% OP SOLN: 1.0000 [drp] | Freq: Two times a day (BID) | OPHTHALMIC | 0 refills | Status: DC

## 2021-09-29 MED ORDER — LORATADINE 10 MG PO TABS: 10.0000 mg | ORAL_TABLET | Freq: Every day | ORAL | 0 refills | Status: DC

## 2021-09-29 MED ORDER — HYDROCORTISONE 1 % EX CREA: TOPICAL_CREAM | CUTANEOUS | 0 refills | Status: DC

## 2021-09-29 NOTE — ED Triage Notes (Signed)
C/o rash on face and this am eyes had swelling. Pt had dialysis this morning. ?

## 2021-09-29 NOTE — Discharge Instructions (Signed)
For your eyes ? ?-You may wipe eyes with cool compresses or placing his eyes for comfort ?-You may use Tylenol every 6 hours just for general comfort ?-Begin use of Claritin daily, as symptoms may be related to exposure to pollen ?-Begin use Polytrim which is an antibiotic eyedrop prophylactically, use once every morning and once every evening for the next 7 days ?-You may follow-up with urgent care if symptoms continue to persist ? ?For your rash ? ?-The cause of your rash is unknown at this time and we will move forward with conservative treatment ?- Apply hydrocortisone cream twice daily until rash has cleared ?- You may follow-up with urgent care or with your primary doctor in 1 to 2 weeks for reevaluation if no improvement seen ?

## 2021-09-29 NOTE — ED Provider Notes (Signed)
CSN: [id number] Arrival date & time: 09/29/21  1730      History   Chief Complaint Chief Complaint  Patient presents with   Rash    HPI Tyler Hahn is a 81 y.o. male.   Concerned with bilateral eye redness, mild itching, swelling and light sensitivity causing blurry vision for 3 to 4 days.  Wears glasses.  Has not attempted treatment of symptoms.  No known sick contacts.  Denies drainage, fever, chills, URI symptoms.  Patient concerned with rash for 1-1/2 weeks.  Began on forehead and has spread to the lower half of face.  Denies itching, drainage, fevers, chills.  Recently changed his laundry treated but unsure if symptoms occurred prior to or after.  Denies shortness of breath, wheezing, coughing, itchy throat, difficulty swallowing.    Past Medical History:  Diagnosis Date   Anxiety    Automatic implantable cardioverter-defibrillator in situ    Tyler Hahn   CHF (congestive heart failure) ([facility])    2000   Diabetes mellitus    no meds   Fatty liver    Gout    "bout 2-3 months ago"-meds helped.   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 03/02/2020   Weakness    ESRD (end stage renal disease) ([facility]) 08/30/2019   History of anemia due to CKD 08/18/2019   Hyperkalemia 08/17/2019   Hypothyroid 08/16/2019   Physical exam 02/18/2019   Greater trochanteric bursitis of left hip 08/04/2018   Osteoarthritis 09/25/2016   Screen for colon cancer    Benign neoplasm of transverse colon    Edema 07/28/2014   Adjustment disorder with depressed mood 03/24/2014   Small bowel mass 12/20/2013   Myalgia 07/07/2013   Loss of weight  04/19/2013   Polyarthralgia 04/19/2013   Loss of appetite 04/19/2013   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   SYSTOLIC HEART FAILURE, CHRONIC 06/07/2010   TESTICULAR MASS 04/04/2010   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[facility]) 03/07/2010   Hyperlipidemia 03/07/2010   GOUT 03/07/2010   Essential hypertension 03/07/2010   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    Past Surgical History:  Procedure Laterality Date   AV FISTULA PLACEMENT Right 08/23/2019   Procedure: ARTERIOVENOUS GORTEX GRAFT RIGHT ARM;  Surgeon: Larina Earthly, MD;  [facility];  Service: Vascular;  Laterality: Right;   CARDIAC DEFIBRILLATOR PLACEMENT     COLONOSCOPY WITH PROPOFOL N/A 10/03/2015   Procedure: COLONOSCOPY WITH PROPOFOL;  Surgeon: Meryl Dare, MD;  [facility];  Service: Endoscopy;  Laterality: N/A;   DIAGNOSTIC LAPAROSCOPY  01/27/2014   Dr Luisa Hart   ENTEROSCOPY N/A 11/25/2013   Procedure: ENTEROSCOPY;  Surgeon: Meryl Dare, MD;  [facility];  Service: Endoscopy;  Laterality: N/A;   ICD GENERATOR CHANGEOUT N/A 11/06/2020   Procedure: ICD GENERATOR CHANGEOUT;  Surgeon: Duke Salvia, MD;  Location: [facility];  Service: Cardiovascular;  Laterality: N/A;   ICD,[place] [employer]     LAPAROSCOPY N/A 01/27/2014   Procedure: LAPAROSCOPY DIAGNOSTIC;  Surgeon: Clovis Pu. Cornett,  MD;  Location: [facility];  Service: General;  Laterality: N/A;   polyp removal throat     difficulty speaking       Home Medications    Prior to Admission medications   Medication Sig Start Date End Date Taking? Authorizing Provider  allopurinol (ZYLOPRIM) 100 MG tablet TAKE 1 TABLET BY MOUTH 3 TIMES A WEEK ON MONDAY, [facility] AND FRIDAY 08/02/21   Sheliah Hatch, MD  atorvastatin (LIPITOR) 20 MG tablet TAKE 1 TABLET BY MOUTH EVERY DAY 04/04/21   Sheliah Hatch, MD  bacitracin ointment Apply 1  application. topically 2 (two) times daily. 08/22/21   Pricilla Loveless, MD  donepezil (ARICEPT) 10 MG tablet Take 1 tablet (10 mg total) by mouth at bedtime. 05/23/21   Sheliah Hatch, MD  levothyroxine (SYNTHROID) 25 MCG tablet TAKE 1 TABLET BY MOUTH BEFORE BREAKFAST 05/07/21   Sheliah Hatch, MD  Multiple Vitamins-Minerals (MULTIVITAMIN WITH MINERALS) tablet Take 1 tablet by mouth daily.    [provider]  OVER THE COUNTER MEDICATION Take 1 capsule by mouth daily. Brain Food    [provider]  sertraline (ZOLOFT) 100 MG tablet TAKE 1 TABLET BY MOUTH EVERY DAY 07/03/21   Sheliah Hatch, MD    Family History Family History  Problem Relation Age of Onset   Stomach cancer Mother    Alzheimer's disease Mother    Diabetes Father    Heart disease Father    Liver disease Father    Kidney disease Father     Social History Social History   Tobacco Use   Smoking status: Former    Types: Cigarettes    Quit date: 11/26/1998    Years since quitting: 22.8   Smokeless tobacco: Never  Vaping Use   Vaping Use: Never used  Substance Use Topics   Alcohol use: No    Alcohol/week: 0.0 standard drinks   Drug use: No     Allergies   Sulfa antibiotics and Sulfonamide derivatives   Review of Systems Review of Systems Defer to HPI    Physical Exam Triage Vital Signs ED Triage Vitals  Enc Vitals Group     BP 09/29/21 1817 (!) 128/58     Pulse Rate 09/29/21 1817 75     Resp 09/29/21 1817 18     Temp 09/29/21 1817 98.3 F (36.8 C)     Temp Source 09/29/21 1817 Oral     SpO2 09/29/21 1817 95 %     Weight --      Height --      Head Circumference --      Peak Flow --      Pain Score 09/29/21 1820 0     Pain Loc --      Pain Edu? --      Excl. in GC? --    No data found.  Updated Vital Signs BP (!) 128/58 (BP Location: Left Arm)   Pulse 75   Temp 98.3 F (36.8 C) (Oral)   Resp 18   SpO2 95%   Visual Acuity Right Eye Distance:   Left Eye  Distance:   Bilateral Distance:    Right Eye Near:   Left Eye Near:    Bilateral Near:     Physical Exam Constitutional:      Appearance: Normal appearance.  HENT:     Head: Normocephalic.  Eyes:     Comments: Erythema noted to the bilateral conjunctiva and sclera, eyes tearing but no drainage  noted, vision grossly intact, extraocular movements intact, mild swelling to the left upper periorbital region  Pulmonary:     Effort: Pulmonary effort is normal.  Skin:    Comments: Cracking to the forehead and bilateral cheeks, no erythema or drainage noted  Neurological:     Mental Status: He is alert and oriented to person, place, and time. Mental status is at baseline.  Psychiatric:        Mood and Affect: Mood normal.        Behavior: Behavior normal.     UC Treatments / Results  Labs (all labs ordered are listed, but only abnormal results are displayed) Labs Reviewed - No data to display  EKG   Radiology No results found.  Procedures Procedures (including critical care time)  Medications Ordered in UC Medications - No data to display  Initial Impression / Assessment and Plan / UC Course  I have reviewed the triage vital signs and the nursing notes.  Pertinent labs & imaging results that were available during my care of the patient were reviewed by me and considered in my medical decision making (see chart for details).  Eye irritation Rash  Etiology of symptoms are allergic versus bacterial, will move forward with coverage for both, prescribed Claritin daily and prescribed a 7-day course of Polytrim, discussed admission stretching, Polytrim to be administered twice daily as this is a more manageable regimen for patient, recommended cool compresses to the eye and Tylenol or ibuprofen for comfort, may follow-up with urgent care as needed if symptoms continue to persist  Unknown etiology of rash, will move forward with conservative management, recommended hydrocortisone  twice daily to the affected area until resolution, may follow-up with urgent care or primary care doctor in 1 to 2 weeks after consistent medication use if symptoms have not improved   Final diagnoses:  None   Discharge Instructions   None    ED Prescriptions   None    PDMP not reviewed this encounter.   Valinda Hoar, [place] 09/30/21 [phone]

## 2021-10-01 ENCOUNTER — Other Ambulatory Visit: Payer: Self-pay | Admitting: Family Medicine

## 2021-10-01 ENCOUNTER — Telehealth: Payer: Self-pay

## 2021-10-01 NOTE — Telephone Encounter (Signed)
BSC alert Accelerated ventricular arrhythmia episode. ?Sep 29, 2021 16:11 - Antitachycardia pacing (ATP) therapy delivered to convert arrhythmia. ?Sep 29, 2021 16:11 - Ventricular shock therapy delivered to convert arrhythmia. ?EGM shows sustained VT, ATP delivered x1, rhythm deteriorates to VF, HV therapy 41J delivered slowing to VT below detection zone ?HL=40 ?Presenting rhythm regular  AS/VS ?Route to triage ?LA ? ?Successful telephone encounter to patients son (on [place]) who confirms patient had an "episode" at dialysis Friday afternoon. States dialysis staff Reviewed medications with son however patient is not on an antiarrythmic. Does not drive. Shock plan reviewed with son. Routing to Dr. Caryl Comes for review and advisement.  ? ? ? ? ? ? ? ? ? ? ? ?

## 2021-10-01 NOTE — Telephone Encounter (Signed)
I thought I answered this this am--but would have them do his dialysis on a higher k bath if possible--the sequence is long-short and thus concerning for TdP   thanks  ?

## 2021-10-02 DIAGNOSIS — D689 Coagulation defect, unspecified: Secondary | ICD-10-CM | POA: Diagnosis not present

## 2021-10-02 DIAGNOSIS — Z992 Dependence on renal dialysis: Secondary | ICD-10-CM | POA: Diagnosis not present

## 2021-10-02 DIAGNOSIS — N2581 Secondary hyperparathyroidism of renal origin: Secondary | ICD-10-CM | POA: Diagnosis not present

## 2021-10-02 DIAGNOSIS — N186 End stage renal disease: Secondary | ICD-10-CM | POA: Diagnosis not present

## 2021-10-03 ENCOUNTER — Telehealth: Payer: Self-pay

## 2021-10-03 DIAGNOSIS — I472 Ventricular tachycardia, unspecified: Secondary | ICD-10-CM

## 2021-10-03 NOTE — Telephone Encounter (Signed)
Erroneous encounter

## 2021-10-03 NOTE — Telephone Encounter (Signed)
Unsuccessful telephone encounter to patients son (on [place]) in attempt to obtain patient's dialysis center contact. [employer] compliant VM message left requesting call back to [phone]. ? ?After significant chart review, noted patient's dialysis center is [facility] dialysis. Successful call to center to discuss Dr. Aquilla Hacker recommendation to increase patient's K bath to prevent further torsades de point post dialysis. Spoke with Elberta Fortis, clinic manager who recalls patient's event on 09/29/21. States patient had been taken off machine and RN was attempting to remove needles when patient has an "almost seizure like event". BP, blood sugar, all other vitals were WNL. Patient was discharged home. Elberta Fortis informed that patient has received shock for VT/VF and Dr. Caryl Comes recommendations discussed. Elberta Fortis states patient is receiving highest K bath (3 K bath) however he may be able to order a 4 K bath from supplier if Dr. Carolin Sicks agrees. Meliton Rattan that notes and recommendations could be reviewed in Epic under telephone encounter 10/01/21 by Truddie Crumble E. Rollene Rotunda, RN/Cardiology.  ?

## 2021-10-04 ENCOUNTER — Encounter (HOSPITAL_COMMUNITY): Payer: Self-pay

## 2021-10-04 ENCOUNTER — Emergency Department (HOSPITAL_COMMUNITY)
Admission: EM | Admit: 2021-10-04 | Discharge: 2021-10-04 | Disposition: A | Discharge status: Home / Self Care | Payer: Medicare HMO | Attending: Emergency Medicine | Admitting: Emergency Medicine

## 2021-10-04 ENCOUNTER — Emergency Department (HOSPITAL_COMMUNITY): Payer: Medicare HMO

## 2021-10-04 DIAGNOSIS — N186 End stage renal disease: Secondary | ICD-10-CM | POA: Diagnosis not present

## 2021-10-04 DIAGNOSIS — E1122 Type 2 diabetes mellitus with diabetic chronic kidney disease: Secondary | ICD-10-CM | POA: Insufficient documentation

## 2021-10-04 DIAGNOSIS — Y69 Unspecified misadventure during surgical and medical care: Secondary | ICD-10-CM | POA: Insufficient documentation

## 2021-10-04 DIAGNOSIS — D689 Coagulation defect, unspecified: Secondary | ICD-10-CM | POA: Diagnosis not present

## 2021-10-04 DIAGNOSIS — T82598A Other mechanical complication of other cardiac and vascular devices and implants, initial encounter: Secondary | ICD-10-CM | POA: Insufficient documentation

## 2021-10-04 DIAGNOSIS — R410 Disorientation, unspecified: Secondary | ICD-10-CM | POA: Diagnosis not present

## 2021-10-04 DIAGNOSIS — N2581 Secondary hyperparathyroidism of renal origin: Secondary | ICD-10-CM | POA: Diagnosis not present

## 2021-10-04 DIAGNOSIS — H1013 Acute atopic conjunctivitis, bilateral: Secondary | ICD-10-CM

## 2021-10-04 DIAGNOSIS — Z992 Dependence on renal dialysis: Secondary | ICD-10-CM | POA: Insufficient documentation

## 2021-10-04 DIAGNOSIS — R079 Chest pain, unspecified: Secondary | ICD-10-CM | POA: Diagnosis not present

## 2021-10-04 DIAGNOSIS — J9811 Atelectasis: Secondary | ICD-10-CM | POA: Diagnosis not present

## 2021-10-04 DIAGNOSIS — T829XXA Unspecified complication of cardiac and vascular prosthetic device, implant and graft, initial encounter: Secondary | ICD-10-CM

## 2021-10-04 DIAGNOSIS — I959 Hypotension, unspecified: Secondary | ICD-10-CM | POA: Diagnosis not present

## 2021-10-04 DIAGNOSIS — I509 Heart failure, unspecified: Secondary | ICD-10-CM | POA: Diagnosis not present

## 2021-10-04 DIAGNOSIS — R42 Dizziness and giddiness: Secondary | ICD-10-CM | POA: Diagnosis not present

## 2021-10-04 DIAGNOSIS — T82897A Other specified complication of cardiac prosthetic devices, implants and grafts, initial encounter: Secondary | ICD-10-CM | POA: Diagnosis not present

## 2021-10-04 DIAGNOSIS — Z743 Need for continuous supervision: Secondary | ICD-10-CM | POA: Diagnosis not present

## 2021-10-04 LAB — BASIC METABOLIC PANEL
Anion gap: 12 (ref 5–15)
BUN: 9 mg/dL (ref 8–23)
CO2: 27 mmol/L (ref 22–32)
Calcium: 8.9 mg/dL (ref 8.9–10.3)
Chloride: 98 mmol/L (ref 98–111)
Creatinine, Ser: 2.95 mg/dL — ABNORMAL HIGH (ref 0.61–1.24)
GFR, Estimated: 21 mL/min — ABNORMAL LOW (ref >60)
Glucose, Bld: 72 mg/dL (ref 70–99)
Potassium: 3.7 mmol/L (ref 3.5–5.1)
Sodium: 137 mmol/L (ref 135–145)

## 2021-10-04 LAB — CBC
HCT: 35.7 % — ABNORMAL LOW (ref 39.0–52.0)
Hemoglobin: 11.6 g/dL — ABNORMAL LOW (ref 13.0–17.0)
MCH: 29.1 pg (ref 26.0–34.0)
MCHC: 32.5 g/dL (ref 30.0–36.0)
MCV: 89.7 fL (ref 80.0–100.0)
Platelets: 102 10*3/uL — ABNORMAL LOW (ref 150–400)
RBC: 3.98 MIL/uL — ABNORMAL LOW (ref 4.22–5.81)
RDW: 18.6 % — ABNORMAL HIGH (ref 11.5–15.5)
WBC: 4.6 10*3/uL (ref 4.0–10.5)
nRBC: 0 % (ref 0.0–0.2)

## 2021-10-04 LAB — TROPONIN I (HIGH SENSITIVITY): Troponin I (High Sensitivity): 54 ng/L — ABNORMAL HIGH (ref <18)

## 2021-10-04 MED ORDER — POTASSIUM CHLORIDE CRYS ER 20 MEQ PO TBCR
20.0000 meq | EXTENDED_RELEASE_TABLET | Freq: Once | ORAL | Status: AC
  Administered 2021-10-04: 20 meq via ORAL
  Filled 2021-10-04: qty 1

## 2021-10-04 MED ORDER — AZELASTINE HCL 0.05 % OP SOLN: 1.0000 [drp] | Freq: Two times a day (BID) | OPHTHALMIC | 1 refills | Status: AC

## 2021-10-04 NOTE — ED Notes (Signed)
Faxed [facility] interrogation report given to EDP  ?

## 2021-10-04 NOTE — ED Provider Notes (Signed)
?  Face-to-face evaluation ? ? ?History: Patient was at dialysis today, when he stood up felt weak and his defibrillator fired.  He apparently sat back down but did not lose consciousness.  He was transferred by EMS for evaluation.  His right upper arm fistula was still accessed, on arrival here.  Patient states he is feeling better now, almost back to normal.  He ate some food at dialysis, is not hungry now.  Patient's son is with him and states that the dialysis team told him that the patient was, cleansed, but fluid was not taken off.  Apparently his weight has been lower than expected therefore additional fluid was not removed. ? ?Physical exam: Elderly alert calm cooperative. ? ?MDM: Evaluation for  ?Chief Complaint  ?Patient presents with  ? AICD Problem  ?  ? ?Dialysis patient, with defibrillator firing.  He also had a recent defibrillation several days ago.  Patient is otherwise been doing well, without periods of syncope, nausea, vomiting, fever, chills, focal weakness or paresthesia.  Interrogation reveals single shock for ventricular rate to 65.  Successful conversion to normal rhythm.  No recurrence of symptoms.  Patient has secondary allergy complaints of itchy eyes left greater than right and dryness of the skin of his face.  Prescription will be given for antihistamine ophthalmic medication. ? ?Consultation with external physicians: ?1-cardiologist, reviewed record and recommends okay to discharge, but treat with potassium 20 mEq prior to discharge. ?2-nephrologist, checked that patient is on appropriate potassium bath, and he has that order in currently.  Patient was discharged ? ?Medical screening examination/treatment/procedure(s) were conducted as a shared visit with non-physician practitioner(s) and myself.  I personally evaluated the patient during the encounter ? ?  ?Daleen Bo, MD ?10/05/21 0009 ? ?

## 2021-10-04 NOTE — Discharge Instructions (Signed)
Continue dialysis as usual.  We sent a prescription for some eyedrops to treat the discomfort, which is probably from allergies.  Make sure you are eating and drinking regularly. ?

## 2021-10-04 NOTE — ED Provider Notes (Signed)
?[place] ?Provider Note ? ? ?CSN: [id number] ?Arrival date & time: 10/04/21  1658 ? ?  ? ?History ? ?Chief Complaint  ?Patient presents with  ? AICD Problem  ? ? ?Tyler Hahn is a 81 y.o. male with a past medical history of CHF, diabetes, ESRD, presents to the emergency department complaining of AICD problem onset today.  Patient notes that he was at dialysis he notes that he felt lightheaded and he thinks that he "fell asleep" for couple seconds.  Also noted generalized weakness and that the defibrillator fired.  This was witnessed by the nurse at the dialysis center today.  Patient notes that he feels close to his baseline currently.  He goes to dialysis on Tuesday, Thursday, Saturday.  His cardiologist is Dr. Caryl Comes.  He denies anticoagulants at this time.  Denies dizziness, shortness of breath, chest pain, abdominal pain, nausea, vomiting. ? ? ?The history is provided by the patient. No language interpreter was used.  ? ?  ? ?Home Medications ?Prior to Admission medications   ?Medication Sig Start Date End Date Taking? Authorizing Provider  ?allopurinol (ZYLOPRIM) 100 MG tablet TAKE 1 TABLET BY MOUTH 3 TIMES A WEEK ON MONDAY, [facility] AND FRIDAY 08/02/21   [name], MD  ?atorvastatin (LIPITOR) 20 MG tablet TAKE 1 TABLET BY MOUTH EVERY DAY 10/01/21   [name], MD  ?bacitracin ointment Apply 1 application. topically 2 (two) times daily. 08/22/21   Sherwood Gambler, MD  ?donepezil (ARICEPT) 10 MG tablet Take 1 tablet (10 mg total) by mouth at bedtime. 05/23/21   [name], MD  ?hydrocortisone cream 1 % Apply to affected area 2 times daily 09/29/21   Hans Eden, NP  ?levothyroxine (SYNTHROID) 25 MCG tablet TAKE 1 TABLET BY MOUTH BEFORE BREAKFAST 05/07/21   [name], MD  ?loratadine (CLARITIN) 10 MG tablet Take 1 tablet (10 mg total) by mouth daily. 09/29/21   Hans Eden, NP  ?Multiple Vitamins-Minerals (MULTIVITAMIN WITH  MINERALS) tablet Take 1 tablet by mouth daily.    [provider]  ?OVER THE COUNTER MEDICATION Take 1 capsule by mouth daily. Brain Food    [provider]  ?sertraline (ZOLOFT) 100 MG tablet TAKE 1 TABLET BY MOUTH EVERY DAY 10/01/21   [name], MD  ?trimethoprim-polymyxin b (POLYTRIM) ophthalmic solution Place 1 drop into both eyes 2 (two) times daily. 09/29/21   Hans Eden, NP  ?   ? ?Allergies    ?Sulfa antibiotics and Sulfonamide derivatives   ? ?Review of Systems   ?Review of Systems  ?Respiratory:  Negative for shortness of breath.   ?Cardiovascular:  Negative for chest pain.  ?Gastrointestinal:  Negative for abdominal pain, nausea and vomiting.  ?Neurological:  Positive for syncope and light-headedness. Negative for dizziness.  ?All other systems reviewed and are negative. ? ?Physical Exam ?Updated Vital Signs ?BP 104/84   Pulse 84   Temp 98.1 ?F (36.7 ?C) (Oral)   Resp 19   SpO2 100%  ?Physical Exam ?Vitals and nursing note reviewed.  ?Constitutional:   ?   General: He is not in acute distress. ?   Appearance: He is not diaphoretic.  ?HENT:  ?   Head: Normocephalic and atraumatic.  ?   Mouth/Throat:  ?   Pharynx: No oropharyngeal exudate.  ?Eyes:  ?   General: No scleral icterus. ?   Conjunctiva/sclera: Conjunctivae normal.  ?Cardiovascular:  ?   Rate and Rhythm: Normal  rate and regular rhythm.  ?   Pulses: Normal pulses.  ?   Heart sounds: Normal heart sounds.  ?Pulmonary:  ?   Effort: Pulmonary effort is normal. No respiratory distress.  ?   Breath sounds: Normal breath sounds. No wheezing.  ?Abdominal:  ?   General: Bowel sounds are normal.  ?   Palpations: Abdomen is soft. There is no mass.  ?   Tenderness: There is no abdominal tenderness. There is no guarding or rebound.  ?Musculoskeletal:     ?   General: Normal range of motion.  ?   Cervical back: Normal range of motion and neck supple.  ?   Comments: AV fistula access in place to right upper arm.   ?Skin: ?    General: Skin is warm and dry.  ?Neurological:  ?   Mental Status: He is alert.  ?Psychiatric:     ?   Behavior: Behavior normal.  ? ? ?ED Results / Procedures / Treatments   ?Labs ?(all labs ordered are listed, but only abnormal results are displayed) ?Labs Reviewed  ?CBC - Abnormal; Notable for the following components:  ?    Result Value  ? RBC 3.98 (*)   ? Hemoglobin 11.6 (*)   ? HCT 35.7 (*)   ? RDW 18.6 (*)   ? Platelets 102 (*)   ? All other components within normal limits  ?BASIC METABOLIC PANEL  ?TROPONIN I (HIGH SENSITIVITY)  ?TROPONIN I (HIGH SENSITIVITY)  ? ? ?EKG ?None ? ?Radiology ?DG Chest Port 1 View ? ?Result Date: 10/04/2021 ?CLINICAL DATA:  Chest pain. EXAM: PORTABLE CHEST 1 VIEW COMPARISON:  Chest x-ray 09/11/2019. FINDINGS: The heart is enlarged, unchanged. Left-sided pacemaker is again seen. Lung volumes are low. There is minimal bibasilar atelectasis. There is no pleural effusion or pneumothorax. No acute fractures are seen. IMPRESSION: 1. Low lung volumes with bibasilar atelectasis. 2. Stable cardiomegaly. Electronically Signed   By: Ronney Asters M.D.   On: 10/04/2021 17:55   ? ?Procedures ?Procedures  ? ? ?Medications Ordered in ED ?Medications - No data to display ? ?ED Course/ Medical Decision Making/ A&P ?  ?                        ?Medical Decision Making ?Amount and/or Complexity of Data Reviewed ?Labs: ordered. ?Radiology: ordered. ? ? ?Pt presents with concerns for AICD problem onset prior to arrival.  Patient was at dialysis when he began to feel lightheaded and per patient he feels that he may have passed out.  Denies chest pain, shortness of breath, palpitations, dizziness.  Patient notes that he feels back at his baseline currently.  Vital signs stable, patient afebrile, not tachycardic or hypoxic.  On exam patient without acute cardiovascular, respiratory, abdominal exam findings.  Differential diagnosis includes arrhythmia, AICD problem, ACS. ? ? ?Labs:  ?I ordered, and  personally interpreted labs.  The pertinent results include:   ?Troponin, BNP ordered and pending at time of signout. ?CBC overall unremarkable. ? ?Imaging: ?I ordered imaging studies including chest x-ray ?I independently visualized and interpreted imaging which showed:  ?1. Low lung volumes with bibasilar atelectasis.  ?2. Stable cardiomegaly.  ? ?I agree with the radiologist interpretation ? ?Patient case discussed with Dr. Eulis Foster, at sign-out. Plan at sign-out is pending labs, imaging, AICD interrogation and likely consult with cardiology for recommendations. Patient care transferred at sign out.  ? ? ?This chart was dictated using voice recognition software, Dragon.  Despite the best efforts of this provider to proofread and correct errors, errors may still occur which can change documentation meaning. ? ? ?Final Clinical Impression(s) / ED Diagnoses ?Final diagnoses:  ?AICD problem  ? ? ?Rx / DC Orders ?ED Discharge Orders   ? ? None  ? ?  ? ? ?  ?Ski Polich A, PA-C ?10/04/21 1919 ? ?  ?Daleen Bo, MD ?10/05/21 0010 ? ?

## 2021-10-04 NOTE — ED Triage Notes (Signed)
Pt BIB [facility] from dialysis after reporting dizziness followed by sharp chest pain that felt like he had been shot. EMS reports previous episode of same during which pt's ICD fired twice. Pt in NAD, reports no pain at this time. HR 88 en route, BP 128/96 ?

## 2021-10-05 ENCOUNTER — Ambulatory Visit: Payer: Medicare HMO | Admitting: Family Medicine

## 2021-10-06 DIAGNOSIS — N186 End stage renal disease: Secondary | ICD-10-CM | POA: Diagnosis not present

## 2021-10-06 DIAGNOSIS — N2581 Secondary hyperparathyroidism of renal origin: Secondary | ICD-10-CM | POA: Diagnosis not present

## 2021-10-06 DIAGNOSIS — Z992 Dependence on renal dialysis: Secondary | ICD-10-CM | POA: Diagnosis not present

## 2021-10-06 DIAGNOSIS — D689 Coagulation defect, unspecified: Secondary | ICD-10-CM | POA: Diagnosis not present

## 2021-10-10 DIAGNOSIS — N2581 Secondary hyperparathyroidism of renal origin: Secondary | ICD-10-CM | POA: Diagnosis not present

## 2021-10-10 DIAGNOSIS — N186 End stage renal disease: Secondary | ICD-10-CM | POA: Diagnosis not present

## 2021-10-10 DIAGNOSIS — E876 Hypokalemia: Secondary | ICD-10-CM | POA: Diagnosis not present

## 2021-10-10 DIAGNOSIS — Z992 Dependence on renal dialysis: Secondary | ICD-10-CM | POA: Diagnosis not present

## 2021-10-10 DIAGNOSIS — D689 Coagulation defect, unspecified: Secondary | ICD-10-CM | POA: Diagnosis not present

## 2021-10-11 DIAGNOSIS — Z992 Dependence on renal dialysis: Secondary | ICD-10-CM | POA: Diagnosis not present

## 2021-10-11 DIAGNOSIS — E876 Hypokalemia: Secondary | ICD-10-CM | POA: Diagnosis not present

## 2021-10-11 DIAGNOSIS — D689 Coagulation defect, unspecified: Secondary | ICD-10-CM | POA: Diagnosis not present

## 2021-10-11 DIAGNOSIS — N186 End stage renal disease: Secondary | ICD-10-CM | POA: Diagnosis not present

## 2021-10-11 DIAGNOSIS — N2581 Secondary hyperparathyroidism of renal origin: Secondary | ICD-10-CM | POA: Diagnosis not present

## 2021-10-13 DIAGNOSIS — Z992 Dependence on renal dialysis: Secondary | ICD-10-CM | POA: Diagnosis not present

## 2021-10-13 DIAGNOSIS — E876 Hypokalemia: Secondary | ICD-10-CM | POA: Diagnosis not present

## 2021-10-13 DIAGNOSIS — N186 End stage renal disease: Secondary | ICD-10-CM | POA: Diagnosis not present

## 2021-10-13 DIAGNOSIS — N2581 Secondary hyperparathyroidism of renal origin: Secondary | ICD-10-CM | POA: Diagnosis not present

## 2021-10-13 DIAGNOSIS — D689 Coagulation defect, unspecified: Secondary | ICD-10-CM | POA: Diagnosis not present

## 2021-10-14 DIAGNOSIS — I129 Hypertensive chronic kidney disease with stage 1 through stage 4 chronic kidney disease, or unspecified chronic kidney disease: Secondary | ICD-10-CM | POA: Diagnosis not present

## 2021-10-14 DIAGNOSIS — Z992 Dependence on renal dialysis: Secondary | ICD-10-CM | POA: Diagnosis not present

## 2021-10-14 DIAGNOSIS — N186 End stage renal disease: Secondary | ICD-10-CM | POA: Diagnosis not present

## 2021-10-15 NOTE — Telephone Encounter (Signed)
I also spoke with patients primary nephrologist regarding K support to try to influence VT burden  ?Thanks SK   ?

## 2021-10-16 DIAGNOSIS — D689 Coagulation defect, unspecified: Secondary | ICD-10-CM | POA: Diagnosis not present

## 2021-10-16 DIAGNOSIS — N186 End stage renal disease: Secondary | ICD-10-CM | POA: Diagnosis not present

## 2021-10-16 DIAGNOSIS — N2581 Secondary hyperparathyroidism of renal origin: Secondary | ICD-10-CM | POA: Diagnosis not present

## 2021-10-16 DIAGNOSIS — E876 Hypokalemia: Secondary | ICD-10-CM | POA: Diagnosis not present

## 2021-10-16 DIAGNOSIS — Z992 Dependence on renal dialysis: Secondary | ICD-10-CM | POA: Diagnosis not present

## 2021-10-18 DIAGNOSIS — D689 Coagulation defect, unspecified: Secondary | ICD-10-CM | POA: Diagnosis not present

## 2021-10-18 DIAGNOSIS — E876 Hypokalemia: Secondary | ICD-10-CM | POA: Diagnosis not present

## 2021-10-18 DIAGNOSIS — N2581 Secondary hyperparathyroidism of renal origin: Secondary | ICD-10-CM | POA: Diagnosis not present

## 2021-10-18 DIAGNOSIS — N186 End stage renal disease: Secondary | ICD-10-CM | POA: Diagnosis not present

## 2021-10-18 DIAGNOSIS — Z992 Dependence on renal dialysis: Secondary | ICD-10-CM | POA: Diagnosis not present

## 2021-10-20 DIAGNOSIS — Z992 Dependence on renal dialysis: Secondary | ICD-10-CM | POA: Diagnosis not present

## 2021-10-20 DIAGNOSIS — D689 Coagulation defect, unspecified: Secondary | ICD-10-CM | POA: Diagnosis not present

## 2021-10-20 DIAGNOSIS — E876 Hypokalemia: Secondary | ICD-10-CM | POA: Diagnosis not present

## 2021-10-20 DIAGNOSIS — N186 End stage renal disease: Secondary | ICD-10-CM | POA: Diagnosis not present

## 2021-10-20 DIAGNOSIS — N2581 Secondary hyperparathyroidism of renal origin: Secondary | ICD-10-CM | POA: Diagnosis not present

## 2021-10-23 DIAGNOSIS — D689 Coagulation defect, unspecified: Secondary | ICD-10-CM | POA: Diagnosis not present

## 2021-10-23 DIAGNOSIS — Z992 Dependence on renal dialysis: Secondary | ICD-10-CM | POA: Diagnosis not present

## 2021-10-23 DIAGNOSIS — N2581 Secondary hyperparathyroidism of renal origin: Secondary | ICD-10-CM | POA: Diagnosis not present

## 2021-10-23 DIAGNOSIS — N186 End stage renal disease: Secondary | ICD-10-CM | POA: Diagnosis not present

## 2021-10-25 DIAGNOSIS — D689 Coagulation defect, unspecified: Secondary | ICD-10-CM | POA: Diagnosis not present

## 2021-10-25 DIAGNOSIS — N186 End stage renal disease: Secondary | ICD-10-CM | POA: Diagnosis not present

## 2021-10-25 DIAGNOSIS — Z992 Dependence on renal dialysis: Secondary | ICD-10-CM | POA: Diagnosis not present

## 2021-10-25 DIAGNOSIS — N2581 Secondary hyperparathyroidism of renal origin: Secondary | ICD-10-CM | POA: Diagnosis not present

## 2021-10-27 DIAGNOSIS — Z992 Dependence on renal dialysis: Secondary | ICD-10-CM | POA: Diagnosis not present

## 2021-10-27 DIAGNOSIS — N2581 Secondary hyperparathyroidism of renal origin: Secondary | ICD-10-CM | POA: Diagnosis not present

## 2021-10-27 DIAGNOSIS — D689 Coagulation defect, unspecified: Secondary | ICD-10-CM | POA: Diagnosis not present

## 2021-10-27 DIAGNOSIS — N186 End stage renal disease: Secondary | ICD-10-CM | POA: Diagnosis not present

## 2021-10-30 ENCOUNTER — Ambulatory Visit: Payer: Medicare HMO | Admitting: Orthopedic Surgery

## 2021-10-30 ENCOUNTER — Encounter: Payer: Self-pay | Admitting: Orthopedic Surgery

## 2021-10-30 DIAGNOSIS — D689 Coagulation defect, unspecified: Secondary | ICD-10-CM | POA: Diagnosis not present

## 2021-10-30 DIAGNOSIS — E876 Hypokalemia: Secondary | ICD-10-CM | POA: Diagnosis not present

## 2021-10-30 DIAGNOSIS — S61302A Unspecified open wound of right middle finger with damage to nail, initial encounter: Secondary | ICD-10-CM | POA: Diagnosis not present

## 2021-10-30 DIAGNOSIS — N186 End stage renal disease: Secondary | ICD-10-CM | POA: Diagnosis not present

## 2021-10-30 DIAGNOSIS — Z992 Dependence on renal dialysis: Secondary | ICD-10-CM | POA: Diagnosis not present

## 2021-10-30 DIAGNOSIS — N2581 Secondary hyperparathyroidism of renal origin: Secondary | ICD-10-CM | POA: Diagnosis not present

## 2021-10-30 NOTE — Progress Notes (Signed)
? ?Office Visit Note ?  ?Patient: Tyler Hahn           ?Date of Birth: 1941-05-17           ?MRN: [id number] ?Visit Date: 10/30/2021 ?             ?Requested by: [name], MD ?[address] ?[place],  [place] [address] ?PCP: [name], MD ? ? ?Assessment & Plan: ?Visit Diagnoses:  ?1. Avulsion of nail of right middle finger   ? ? ?Plan: The majority of patient's right middle finger nail has fallen off.  His previous felon/paronychia has completely resolved.  He has no swelling, erythema, induration, or drainage.  The remainder of the fingernail was removed completely under digital block.  He can follow-up again as needed. ? ?Follow-Up Instructions: No follow-ups on file.  ? ?Orders:  ?No orders of the defined types were placed in this encounter. ? ?No orders of the defined types were placed in this encounter. ? ? ? ? Procedures: ?No procedures performed ? ? ?Clinical Data: ?No additional findings. ? ? ?Subjective: ?Chief Complaint  ?Patient presents with  ? Right Middle Finger - Pain  ?  Paronychia of finger  ? ? ?This is a 81 year old male who presents for follow-up of a right middle finger paronychia.  His last seen in March at which time he had a paronychia/felon which was drained multiple times by the ER.  He was treated with oral antibiotics and daily soaks.  The paronychia/felon is completely resolved.  He has no pain or swelling of the fingertip or nail apparatus.  He has no erythema.  The nail has started to fall off.  He is missing the proximal and radial half of the nail with a small portion of nail remaining adherent to the ulnar nail fold. ? ? ?Review of Systems ? ? ?Objective: ?Vital Signs: BP (!) 149/69 (BP Location: Left Arm, Patient Position: Sitting)   Pulse 87   Ht 5' 7.75" (1.721 m)   Wt 160 lb (72.6 kg)   BMI 24.51 kg/m?  ? ?Physical Exam ? ?Right Hand Exam  ? ?Tenderness  ?The patient is experiencing no tenderness.  ? ?Other  ?Erythema: absent ?Sensation:  normal ?Pulse: present ? ?Comments:  No swelling, erythema, induration of the fingertip.  Missing proximal and radial half of the nail plate.  Normal appearing nail folds/nail bed.  ? ? ? ? ?Specialty Comments:  ?No specialty comments available. ? ?Imaging: ?No results found. ? ? ?PMFS History: ?Patient Active Problem List  ? Diagnosis Date Noted  ? Avulsion of nail of right middle finger 10/30/2021  ? Felon of finger 08/24/2021  ? Multiple falls 08/03/2021  ? VT (ventricular tachycardia) ([place]) 03/12/2021  ? Memory loss 10/16/2020  ? 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 Weakness   ? ESRD (end stage renal disease) ([place]) 08/30/2019  ? History of anemia due to CKD 08/18/2019  ? Hyperkalemia 08/17/2019  ? Hypothyroid 08/16/2019  ? Physical exam 02/18/2019  ? Greater trochanteric bursitis of left hip 08/04/2018  ? Osteoarthritis 09/25/2016  ? Screen for colon cancer   ? Benign neoplasm of transverse colon   ? Edema 07/28/2014  ? Adjustment disorder with depressed mood 03/24/2014  ? Small bowel mass 12/20/2013  ? Myalgia 07/07/2013  ? Loss of weight 04/19/2013  ? Polyarthralgia 04/19/2013  ? Loss of appetite 04/19/2013  ? 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 SYSTOLIC HEART FAILURE, CHRONIC 06/07/2010  ? TESTICULAR MASS 04/04/2010  ? 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[place]) 03/07/2010  ? Hyperlipidemia 03/07/2010  ? GOUT 03/07/2010  ? Essential hypertension 03/07/2010  ? 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  ? ?Past Medical History:  ?Diagnosis Date  ? Anxiety   ? Automatic implantable cardioverter-defibrillator in situ   ? greg taylor  ? CHF (congestive heart failure) ([place])   ? 2000  ? Diabetes mellitus   ? no meds  ? Fatty liver   ? Gout   ? "bout 2-3 months ago"-meds helped.  ? Hyperlipidemia   ? Hypertension   ? Hyperthyroidism   ? Kidney cysts   ? PONV (postoperative nausea and vomiting)   ?  Renal insufficiency   ?  ?Family History  ?Problem Relation Age of Onset  ? Stomach cancer Mother   ? Alzheimer's disease Mother   ? Diabetes Father   ? Heart disease Father   ? Liver disease Father   ? Kidney disease Father   ?  ?Past Surgical History:  ?Procedure Laterality Date  ? AV FISTULA PLACEMENT Right 08/23/2019  ? Procedure: ARTERIOVENOUS GORTEX GRAFT RIGHT ARM;  Surgeon: Rosetta Posner, MD;  Location: [facility];  Service: Vascular;  Laterality: Right;  ? CARDIAC DEFIBRILLATOR PLACEMENT    ? COLONOSCOPY WITH PROPOFOL N/A 10/03/2015  ? Procedure: COLONOSCOPY WITH PROPOFOL;  Surgeon: Ladene Artist, MD;  Location: [facility];  Service: Endoscopy;  Laterality: N/A;  ? DIAGNOSTIC LAPAROSCOPY  01/27/2014  ? Dr Brantley Stage  ? ENTEROSCOPY N/A 11/25/2013  ? Procedure: ENTEROSCOPY;  Surgeon: Ladene Artist, MD;  Location: [facility];  Service: Endoscopy;  Laterality: N/A;  ? ICD GENERATOR CHANGEOUT N/A 11/06/2020  ? Procedure: ICD GENERATOR CHANGEOUT;  Surgeon: [name], MD;  Location: [facility];  Service: Cardiovascular;  Laterality: N/A;  ? ICD,[place] [employer]    ? LAPAROSCOPY N/A 01/27/2014  ? Procedure: LAPAROSCOPY DIAGNOSTIC;  Surgeon: Joyice Faster. Cornett, MD;  Location: [facility];  Service: General;  Laterality: N/A;  ? polyp removal throat    ? difficulty speaking  ? ?Social History  ? ?Occupational History  ? Occupation: Retired  ?  Employer: RETIRED  ?Tobacco Use  ? Smoking status: Former  ?  Types: Cigarettes  ?  Quit date: 11/26/1998  ?  Years since quitting: 22.9  ? Smokeless tobacco: Never  ?Vaping Use  ? Vaping Use: Never used  ?Substance and Sexual Activity  ? Alcohol use: No  ?  Alcohol/week: 0.0 standard drinks  ? Drug use: No  ? Sexual activity: Not on file  ? ? ? ? ? ? ?

## 2021-11-01 DIAGNOSIS — N186 End stage renal disease: Secondary | ICD-10-CM | POA: Diagnosis not present

## 2021-11-01 DIAGNOSIS — N2581 Secondary hyperparathyroidism of renal origin: Secondary | ICD-10-CM | POA: Diagnosis not present

## 2021-11-01 DIAGNOSIS — E876 Hypokalemia: Secondary | ICD-10-CM | POA: Diagnosis not present

## 2021-11-01 DIAGNOSIS — Z992 Dependence on renal dialysis: Secondary | ICD-10-CM | POA: Diagnosis not present

## 2021-11-01 DIAGNOSIS — D689 Coagulation defect, unspecified: Secondary | ICD-10-CM | POA: Diagnosis not present

## 2021-11-03 DIAGNOSIS — N2581 Secondary hyperparathyroidism of renal origin: Secondary | ICD-10-CM | POA: Diagnosis not present

## 2021-11-03 DIAGNOSIS — D689 Coagulation defect, unspecified: Secondary | ICD-10-CM | POA: Diagnosis not present

## 2021-11-03 DIAGNOSIS — Z992 Dependence on renal dialysis: Secondary | ICD-10-CM | POA: Diagnosis not present

## 2021-11-03 DIAGNOSIS — N186 End stage renal disease: Secondary | ICD-10-CM | POA: Diagnosis not present

## 2021-11-03 DIAGNOSIS — E876 Hypokalemia: Secondary | ICD-10-CM | POA: Diagnosis not present

## 2021-11-06 DIAGNOSIS — Z992 Dependence on renal dialysis: Secondary | ICD-10-CM | POA: Diagnosis not present

## 2021-11-06 DIAGNOSIS — N186 End stage renal disease: Secondary | ICD-10-CM | POA: Diagnosis not present

## 2021-11-06 DIAGNOSIS — D631 Anemia in chronic kidney disease: Secondary | ICD-10-CM | POA: Diagnosis not present

## 2021-11-06 DIAGNOSIS — N2581 Secondary hyperparathyroidism of renal origin: Secondary | ICD-10-CM | POA: Diagnosis not present

## 2021-11-06 DIAGNOSIS — D689 Coagulation defect, unspecified: Secondary | ICD-10-CM | POA: Diagnosis not present

## 2021-11-06 DIAGNOSIS — E876 Hypokalemia: Secondary | ICD-10-CM | POA: Diagnosis not present

## 2021-11-08 DIAGNOSIS — D631 Anemia in chronic kidney disease: Secondary | ICD-10-CM | POA: Diagnosis not present

## 2021-11-08 DIAGNOSIS — Z992 Dependence on renal dialysis: Secondary | ICD-10-CM | POA: Diagnosis not present

## 2021-11-08 DIAGNOSIS — E876 Hypokalemia: Secondary | ICD-10-CM | POA: Diagnosis not present

## 2021-11-08 DIAGNOSIS — N2581 Secondary hyperparathyroidism of renal origin: Secondary | ICD-10-CM | POA: Diagnosis not present

## 2021-11-08 DIAGNOSIS — D689 Coagulation defect, unspecified: Secondary | ICD-10-CM | POA: Diagnosis not present

## 2021-11-08 DIAGNOSIS — N186 End stage renal disease: Secondary | ICD-10-CM | POA: Diagnosis not present

## 2021-11-10 DIAGNOSIS — Z992 Dependence on renal dialysis: Secondary | ICD-10-CM | POA: Diagnosis not present

## 2021-11-10 DIAGNOSIS — E876 Hypokalemia: Secondary | ICD-10-CM | POA: Diagnosis not present

## 2021-11-10 DIAGNOSIS — D689 Coagulation defect, unspecified: Secondary | ICD-10-CM | POA: Diagnosis not present

## 2021-11-10 DIAGNOSIS — N2581 Secondary hyperparathyroidism of renal origin: Secondary | ICD-10-CM | POA: Diagnosis not present

## 2021-11-10 DIAGNOSIS — N186 End stage renal disease: Secondary | ICD-10-CM | POA: Diagnosis not present

## 2021-11-10 DIAGNOSIS — D631 Anemia in chronic kidney disease: Secondary | ICD-10-CM | POA: Diagnosis not present

## 2021-11-13 DIAGNOSIS — N2581 Secondary hyperparathyroidism of renal origin: Secondary | ICD-10-CM | POA: Diagnosis not present

## 2021-11-13 DIAGNOSIS — N186 End stage renal disease: Secondary | ICD-10-CM | POA: Diagnosis not present

## 2021-11-13 DIAGNOSIS — Z992 Dependence on renal dialysis: Secondary | ICD-10-CM | POA: Diagnosis not present

## 2021-11-13 DIAGNOSIS — D689 Coagulation defect, unspecified: Secondary | ICD-10-CM | POA: Diagnosis not present

## 2021-11-14 DIAGNOSIS — Z992 Dependence on renal dialysis: Secondary | ICD-10-CM | POA: Diagnosis not present

## 2021-11-14 DIAGNOSIS — N186 End stage renal disease: Secondary | ICD-10-CM | POA: Diagnosis not present

## 2021-11-14 DIAGNOSIS — I129 Hypertensive chronic kidney disease with stage 1 through stage 4 chronic kidney disease, or unspecified chronic kidney disease: Secondary | ICD-10-CM | POA: Diagnosis not present

## 2021-11-15 DIAGNOSIS — Z992 Dependence on renal dialysis: Secondary | ICD-10-CM | POA: Diagnosis not present

## 2021-11-15 DIAGNOSIS — H25813 Combined forms of age-related cataract, bilateral: Secondary | ICD-10-CM | POA: Diagnosis not present

## 2021-11-15 DIAGNOSIS — D689 Coagulation defect, unspecified: Secondary | ICD-10-CM | POA: Diagnosis not present

## 2021-11-15 DIAGNOSIS — N2581 Secondary hyperparathyroidism of renal origin: Secondary | ICD-10-CM | POA: Diagnosis not present

## 2021-11-15 DIAGNOSIS — N186 End stage renal disease: Secondary | ICD-10-CM | POA: Diagnosis not present

## 2021-11-15 DIAGNOSIS — E876 Hypokalemia: Secondary | ICD-10-CM | POA: Diagnosis not present

## 2021-11-17 DIAGNOSIS — D689 Coagulation defect, unspecified: Secondary | ICD-10-CM | POA: Diagnosis not present

## 2021-11-17 DIAGNOSIS — Z992 Dependence on renal dialysis: Secondary | ICD-10-CM | POA: Diagnosis not present

## 2021-11-17 DIAGNOSIS — N186 End stage renal disease: Secondary | ICD-10-CM | POA: Diagnosis not present

## 2021-11-17 DIAGNOSIS — E876 Hypokalemia: Secondary | ICD-10-CM | POA: Diagnosis not present

## 2021-11-17 DIAGNOSIS — N2581 Secondary hyperparathyroidism of renal origin: Secondary | ICD-10-CM | POA: Diagnosis not present

## 2021-11-20 DIAGNOSIS — E876 Hypokalemia: Secondary | ICD-10-CM | POA: Diagnosis not present

## 2021-11-20 DIAGNOSIS — D689 Coagulation defect, unspecified: Secondary | ICD-10-CM | POA: Diagnosis not present

## 2021-11-20 DIAGNOSIS — N2581 Secondary hyperparathyroidism of renal origin: Secondary | ICD-10-CM | POA: Diagnosis not present

## 2021-11-20 DIAGNOSIS — Z992 Dependence on renal dialysis: Secondary | ICD-10-CM | POA: Diagnosis not present

## 2021-11-20 DIAGNOSIS — N186 End stage renal disease: Secondary | ICD-10-CM | POA: Diagnosis not present

## 2021-11-22 DIAGNOSIS — N186 End stage renal disease: Secondary | ICD-10-CM | POA: Diagnosis not present

## 2021-11-22 DIAGNOSIS — D689 Coagulation defect, unspecified: Secondary | ICD-10-CM | POA: Diagnosis not present

## 2021-11-22 DIAGNOSIS — Z992 Dependence on renal dialysis: Secondary | ICD-10-CM | POA: Diagnosis not present

## 2021-11-22 DIAGNOSIS — N2581 Secondary hyperparathyroidism of renal origin: Secondary | ICD-10-CM | POA: Diagnosis not present

## 2021-11-22 DIAGNOSIS — E876 Hypokalemia: Secondary | ICD-10-CM | POA: Diagnosis not present

## 2021-11-24 DIAGNOSIS — E876 Hypokalemia: Secondary | ICD-10-CM | POA: Diagnosis not present

## 2021-11-24 DIAGNOSIS — N2581 Secondary hyperparathyroidism of renal origin: Secondary | ICD-10-CM | POA: Diagnosis not present

## 2021-11-24 DIAGNOSIS — Z992 Dependence on renal dialysis: Secondary | ICD-10-CM | POA: Diagnosis not present

## 2021-11-24 DIAGNOSIS — D689 Coagulation defect, unspecified: Secondary | ICD-10-CM | POA: Diagnosis not present

## 2021-11-24 DIAGNOSIS — N186 End stage renal disease: Secondary | ICD-10-CM | POA: Diagnosis not present

## 2021-11-26 ENCOUNTER — Encounter: Payer: Self-pay | Admitting: Family Medicine

## 2021-11-26 ENCOUNTER — Other Ambulatory Visit: Payer: Medicare HMO

## 2021-11-26 ENCOUNTER — Ambulatory Visit (INDEPENDENT_AMBULATORY_CARE_PROVIDER_SITE_OTHER): Payer: Medicare HMO | Admitting: Family Medicine

## 2021-11-26 ENCOUNTER — Telehealth: Payer: Self-pay

## 2021-11-26 VITALS — BP 138/80 | HR 74 | Temp 98.6°F | Resp 16 | Ht 67.75 in | Wt 169.2 lb

## 2021-11-26 DIAGNOSIS — R443 Hallucinations, unspecified: Secondary | ICD-10-CM

## 2021-11-26 DIAGNOSIS — R1111 Vomiting without nausea: Secondary | ICD-10-CM

## 2021-11-26 DIAGNOSIS — R413 Other amnesia: Secondary | ICD-10-CM | POA: Diagnosis not present

## 2021-11-26 DIAGNOSIS — K219 Gastro-esophageal reflux disease without esophagitis: Secondary | ICD-10-CM | POA: Diagnosis not present

## 2021-11-26 LAB — CBC WITH DIFFERENTIAL/PLATELET
Basophils Absolute: 0 K/uL (ref 0.0–0.1)
Basophils Relative: 1 % (ref 0.0–3.0)
Eosinophils Absolute: 0.2 K/uL (ref 0.0–0.7)
Eosinophils Relative: 3.6 % (ref 0.0–5.0)
HCT: 38.5 % — ABNORMAL LOW (ref 39.0–52.0)
Hemoglobin: 12.4 g/dL — ABNORMAL LOW (ref 13.0–17.0)
Lymphocytes Relative: 15.5 % (ref 12.0–46.0)
Lymphs Abs: 0.7 K/uL (ref 0.7–4.0)
MCHC: 32.2 g/dL (ref 30.0–36.0)
MCV: 92 fl (ref 78.0–100.0)
Monocytes Absolute: 0.5 K/uL (ref 0.1–1.0)
Monocytes Relative: 9.7 % (ref 3.0–12.0)
Neutro Abs: 3.3 K/uL (ref 1.4–7.7)
Neutrophils Relative %: 70.2 % (ref 43.0–77.0)
Platelets: 123 K/uL — ABNORMAL LOW (ref 150.0–400.0)
RBC: 4.18 Mil/uL — ABNORMAL LOW (ref 4.22–5.81)
RDW: 20.4 % — ABNORMAL HIGH (ref 11.5–15.5)
WBC: 4.7 K/uL (ref 4.0–10.5)

## 2021-11-26 LAB — HEPATIC FUNCTION PANEL
ALT: 13 U/L (ref 0–53)
AST: 21 U/L (ref 0–37)
Albumin: 4.3 g/dL (ref 3.5–5.2)
Alkaline Phosphatase: 74 U/L (ref 39–117)
Bilirubin, Direct: 0.1 mg/dL (ref 0.0–0.3)
Total Bilirubin: 0.6 mg/dL (ref 0.2–1.2)
Total Protein: 8.3 g/dL (ref 6.0–8.3)

## 2021-11-26 LAB — BASIC METABOLIC PANEL WITH GFR
BUN: 51 mg/dL — ABNORMAL HIGH (ref 6–23)
CO2: 28 meq/L (ref 19–32)
Calcium: 9.4 mg/dL (ref 8.4–10.5)
Chloride: 87 meq/L — ABNORMAL LOW (ref 96–112)
Creatinine, Ser: 7.15 mg/dL (ref 0.40–1.50)
GFR: 6.75 mL/min — CL
Glucose, Bld: 129 mg/dL — ABNORMAL HIGH (ref 70–99)
Potassium: 4 meq/L (ref 3.5–5.1)
Sodium: 130 meq/L — ABNORMAL LOW (ref 135–145)

## 2021-11-26 LAB — TSH: TSH: 4.64 u[IU]/mL (ref 0.35–5.50)

## 2021-11-26 LAB — AMMONIA: Ammonia: 50 umol/L — ABNORMAL HIGH (ref 11–35)

## 2021-11-26 MED ORDER — OMEPRAZOLE 40 MG PO CPDR: 40.0000 mg | DELAYED_RELEASE_CAPSULE | Freq: Every day | ORAL | 3 refills | Status: DC

## 2021-11-26 NOTE — Progress Notes (Signed)
   Subjective:    Patient ID: Tyler Hahn, male    DOB: 05-06-1941, 81 y.o.   MRN: [id number]  HPI Hallucinations- pt was scheduled for cataract surgery on Friday but could not remember which eye was having surgery so they would not do it.  Pt is convinced there are flies around him.  Will try and pick them off or hit himself.  Sxs first started ~3 weeks ago.  Was sudden onset.  Seems to worsen when sitting down to eat- both at home and when eating out.  Saw Dr Katy Fitch ~2 weeks ago but no comment was made about retinal tears or other abnormalities.  Pt reports he will see crawling bugs/animals but 'very seldom'.  Notes that he will sometimes see people in the apartment- including his wife (who is not there)  Vomiting- caregiver reports that he will be going about his day and then suddenly vomit.  Occurs 'maybe once every 2 weeks'.  At first only occurred after HD but had an episode this morning while eating breakfast and he hasn't had HD since Saturday.  Pt reports he does have acid reflux.  Caregiver reports that he will at times wake up choking and coughing.   Review of Systems For ROS see HPI     Objective:   Physical Exam        Assessment & Plan:   Hallucinations- new.  Pt reported seeing bugs during his appt (none present).  Unclear if his hallucinations are due to UTI or metabolic cause or advancing dementia process.  Will get UA and labs to assess.  In addition to bugs, he will also see people in the apartment at times.  Will refer to Neuro for complete evaluation in case no metabolic or infectious cause found.  Vomiting- new.  Can occur w/ eating.  Also happens after HD.  Unclear as to cause but given that he will cough and choke, I feel it's important to get a swallow study.  Pt and caregiver are in agreement.  GERD- given pt's report of reflux sxs will start Omeprazole once daily.  Hopefully this will improve his sxs and also his vomiting.

## 2021-11-26 NOTE — Telephone Encounter (Signed)
Hope called from the [facility] critical lab values,  Creatnin = 7.15  GFR = 6.75

## 2021-11-26 NOTE — Patient Instructions (Addendum)
Follow up in 2-3 months to recheck how things are going We'll notify you of your lab results and make any changes if needed START the Omeprazole once daily We'll call you with your Neurology appt and hopefully the swallow study Call with any questions or concerns Hang in there!!!

## 2021-11-26 NOTE — Telephone Encounter (Signed)
Noted.  Pt is on dialysis so this is not of concern

## 2021-11-27 ENCOUNTER — Telehealth: Payer: Self-pay

## 2021-11-27 DIAGNOSIS — N186 End stage renal disease: Secondary | ICD-10-CM | POA: Diagnosis not present

## 2021-11-27 DIAGNOSIS — Z992 Dependence on renal dialysis: Secondary | ICD-10-CM | POA: Diagnosis not present

## 2021-11-27 DIAGNOSIS — N2581 Secondary hyperparathyroidism of renal origin: Secondary | ICD-10-CM | POA: Diagnosis not present

## 2021-11-27 DIAGNOSIS — D689 Coagulation defect, unspecified: Secondary | ICD-10-CM | POA: Diagnosis not present

## 2021-11-27 NOTE — Telephone Encounter (Signed)
-----   Message from [name], MD sent at 11/27/2021  8:29 AM EDT ----- Sodium and chloride are mildly low.  This is something that should be addressed at dialysis.  His ammonia level is mildly elevated.  I suspect this is b/c he has not had dialysis since Saturday.  This level is not high enough to cause hallucinations  Remainder of labs are stable

## 2021-11-28 ENCOUNTER — Ambulatory Visit (INDEPENDENT_AMBULATORY_CARE_PROVIDER_SITE_OTHER): Payer: Medicare HMO

## 2021-11-28 ENCOUNTER — Ambulatory Visit: Payer: Medicare HMO | Admitting: Family Medicine

## 2021-11-28 DIAGNOSIS — I428 Other cardiomyopathies: Secondary | ICD-10-CM

## 2021-11-28 NOTE — Progress Notes (Signed)
Chronic Care Management Pharmacy Note  11/28/2021 Name:  Coufal STRACENER MRN:  [id number] DOB:  10/11/1940  Recommendations/Changes made from today's visit:  Improving memory loss with increased dose of donepezil.  Patient now has around the clock care.  Recommended eye exam.  Will get foot exam at upcoming visit with Dr. Birdie Riddle.  No changes - due for foot exam at upcoming CPE!!  FU in 6 months CMA in 3 months  Subjective: Tyler Hahn is an 81 y.o. year old male who is a primary patient of Tabori, Aundra Millet, MD.  The CCM team was consulted for assistance with disease management and care coordination needs.    Engaged with patient by telephone for follow up visit in response to provider referral for pharmacy case management and/or care coordination services. Spoke with patient's son, [name].    Consent to Services:  The patient was given information about Chronic Care Management services, agreed to services, and gave verbal consent prior to initiation of services.  Please see initial visit note for detailed documentation.   Patient Care Team: [name], MD as PCP - General [name], MD as Consulting Physician (Cardiology) Renato Shin, MD (Inactive) as Consulting Physician (Endocrinology) Ladene Artist, MD as Consulting Physician (Gastroenterology) Rosita Fire, MD as Consulting Physician (Nephrology) Edythe Clarity, [facility] (Pharmacist)  Recent office visits:  None noted.    Recent consult visits:  None noted.    Hospital visits:  None in previous 6 months  Objective:  Lab Results  Component Value Date   CREATININE 7.15 (HH) 11/26/2021   CREATININE 2.95 (H) 10/04/2021   CREATININE 5.01 (HH) 08/03/2021    Lab Results  Component Value Date   HGBA1C 5.7 08/03/2021   Last diabetic Eye exam:  Lab Results  Component Value Date/Time   HMDIABEYEEXA No Retinopathy 08/25/2016 12:00 AM    Last diabetic Foot exam: No results  found for: "HMDIABFOOTEX"      Component Value Date/Time   CHOL 183 08/03/2021 1312   TRIG 306.0 (H) 08/03/2021 1312   HDL 48.40 08/03/2021 1312   CHOLHDL 4 08/03/2021 1312   VLDL 61.2 (H) 08/03/2021 1312   LDLCALC 91 02/26/2021 1042   LDLCALC 83 07/04/2017 1555   LDLDIRECT 94.0 08/03/2021 1312       Latest Ref Rng & Units 11/26/2021   12:04 PM 08/03/2021    1:12 PM 02/26/2021   10:42 AM  Hepatic Function  Total Protein 6.0 - 8.3 g/dL 8.3  7.5  7.5   Albumin 3.5 - 5.2 g/dL 4.3  4.5  4.3   AST 0 - 37 U/L 21  22  15    ALT 0 - 53 U/L 13  16  13    Alk Phosphatase 39 - 117 U/L 74  65  68   Total Bilirubin 0.2 - 1.2 mg/dL 0.6  0.4  0.4   Bilirubin, Direct 0.0 - 0.3 mg/dL 0.1  0.1  0.1     Lab Results  Component Value Date/Time   TSH 4.64 11/26/2021 12:04 PM   TSH 4.04 08/03/2021 01:12 PM   FREET4 0.91 08/30/2019 04:02 PM   FREET4 0.68 09/18/2015 01:28 PM       Latest Ref Rng & Units 11/26/2021   12:04 PM 10/04/2021    5:35 PM 08/03/2021    1:12 PM  CBC  WBC 4.0 - 10.5 K/uL 4.7  4.6  4.5   Hemoglobin 13.0 - 17.0 g/dL 12.4  11.6  11.0   Hematocrit 39.0 - 52.0 % 38.5  35.7  34.0   Platelets 150.0 - 400.0 K/uL 123.0  102  102.0     Lab Results  Component Value Date/Time   VD25OH 80.44 03/24/2014 03:41 PM   VD25OH 10 (L) 07/07/2013 02:17 PM    Clinical ASCVD:  The 10-year ASCVD risk score (Arnett DK, et al., 2019) is: 30.4%   Values used to calculate the score:     Age: 72 years     Sex: Male     Is Non-Hispanic African American: Yes     Diabetic: Yes     Tobacco smoker: No     Systolic Blood Pressure: 160 mmHg     Is BP treated: No     HDL Cholesterol: 48.4 mg/dL     Total Cholesterol: 183 mg/dL    Other: (CHADS2VASc if Afib, PHQ9 if depression, MMRC or CAT for COPD, ACT, DEXA)  Social History   Tobacco Use  Smoking Status Former   Types: Cigarettes   Quit date: 11/26/1998   Years since quitting: 23.0  Smokeless Tobacco Never   BP Readings from Last 3  Encounters:  11/26/21 138/80  10/30/21 (!) 149/69  10/04/21 (!) 116/99   Pulse Readings from Last 3 Encounters:  11/26/21 74  10/30/21 87  10/04/21 74   Wt Readings from Last 3 Encounters:  11/26/21 169 lb 3 oz (76.7 kg)  10/30/21 160 lb (72.6 kg)  08/22/21 159 lb 9.5 oz (72.4 kg)    Assessment: Review of patient past medical history, allergies, medications, health status, including review of consultants reports, laboratory and other test data, was performed as part of comprehensive evaluation and provision of chronic care management services.   SDOH:  (Social Determinants of Health) assessments and interventions performed:    CCM Care Plan  Allergies  Allergen Reactions   Sulfa Antibiotics Itching and Rash   Sulfonamide Derivatives Itching and Rash    Medications Reviewed Today     Reviewed by Eli Phillips, CMA (Certified Medical Assistant) on 11/26/21 at 1118  Med List Status: <None>   Medication Order Taking? Sig Documenting Provider Last Dose Status Informant  allopurinol (ZYLOPRIM) 100 MG tablet [id number] Yes TAKE 1 TABLET BY MOUTH 3 TIMES A WEEK ON MONDAY, WEDNESDAY AND FRIDAY [name], MD Taking Active   atorvastatin (LIPITOR) 20 MG tablet [id number] Yes TAKE 1 TABLET BY MOUTH EVERY DAY [name], MD Taking Active   bacitracin ointment [id number] Yes Apply 1 application. topically 2 (two) times daily. Sherwood Gambler, MD Taking Active   donepezil (ARICEPT) 10 MG tablet [id number] Yes Take 1 tablet (10 mg total) by mouth at bedtime. [name], MD Taking Active   hydrocortisone cream 1 % [id number] Yes Apply to affected area 2 times daily Hans Eden, NP Taking Active   levothyroxine (SYNTHROID) 25 MCG tablet [id number] Yes TAKE 1 TABLET BY MOUTH BEFORE BREAKFAST [name], MD Taking Active   loratadine (CLARITIN) 10 MG tablet [id number] Yes Take 1 tablet (10 mg total) by mouth daily. Hans Eden, NP Taking Active    Multiple Vitamins-Minerals (MULTIVITAMIN WITH MINERALS) tablet [id number] Yes Take 1 tablet by mouth daily. [provider] Taking Active Self  OVER THE COUNTER MEDICATION [id number] Yes Take 1 capsule by mouth daily. Brain Food [provider] Taking Active   sertraline (ZOLOFT) 100 MG tablet [id number] Yes TAKE 1 TABLET BY MOUTH EVERY DAY [name], MD Taking Active  trimethoprim-polymyxin b (POLYTRIM) ophthalmic solution [id number] Yes Place 1 drop into both eyes 2 (two) times daily. 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 03/02/2020   Weakness    ESRD (end stage renal disease) ([place]) 08/30/2019   History of anemia due to CKD 08/18/2019   Hyperkalemia 08/17/2019   Hypothyroid 08/16/2019   Physical exam 02/18/2019   Greater trochanteric bursitis of left hip 08/04/2018   Osteoarthritis 09/25/2016   Screen for colon cancer    Benign neoplasm of transverse colon    Edema 07/28/2014   Adjustment disorder with depressed mood 03/24/2014   Small bowel mass 12/20/2013   Myalgia 07/07/2013   Loss of weight 04/19/2013   Polyarthralgia 04/19/2013   Loss of appetite 04/19/2013   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   Secondary cardiomyopathy ([place]) 63/84/5364   SYSTOLIC HEART FAILURE, CHRONIC 06/07/2010   TESTICULAR MASS 04/04/2010   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[place]) 03/07/2010   Hyperlipidemia 03/07/2010   GOUT 03/07/2010   Essential hypertension 03/07/2010   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   Patient Goals/Self-Care Activities Patient will:  - take medications as prescribed  Medication Assistance: None required.  Patient affirms current coverage meets needs. Patient's preferred pharmacy is:  [facility] [phone] - Closed - [place], [place] - [address] AT [facility] [address] [place] [address] Phone: [phone] Fax: [phone]  [id number] [place] [address] [place]Phone: [phone]: [phone] [facility] [facility]Phone: [phone]: [phone] [facility] [address] [address] [facility] [address]: [phone]: [phone] Compliance/Adherence/Medication fill history: Care Gaps: Eye exam - will set up with in office eye exam  Star-[facility] Drugs: Atorvastatin 237m06/21/23 30ds  Uses pill box? Yes  Follow Up:  Patient agrees to Care Plan and Follow-up.  Plan:  6 month rph f/u telephone call or sooner if needed  Future Appointments  Date Time Provider De[place]na6/21/2023 11:00 AM [facility] [place]6/21/2023 11:00 AM [facility]R/F 2 [facility]6/28/2023  3:00 PM [facility] CCM PHARMACIST  [facility] PEC  02/11/2022  7:20 AM CVD-[facility] DEVICE REMOTES [facility]  02/25/2022 10:20 AM TaMidge MiniumMD [facility] PEC  05/13/2022  7:20 AM [facility] DEVICE REMOTES [facility]   ChBeverly MilchPharmD Clinical Pharmacist  [place]  [phone]  Current Barriers:  Dementia   Pharmacist Clinical Goal(s):  Patient will contact provider office for questions/concerns as evidenced notation of same in electronic health record through collaboration with PharmD and provider.   Interventions: 1:1 collaboration with [name], MD regarding development and update of comprehensive plan of care as evidenced by provider attestation and co-signature Inter-disciplinary care team collaboration (see longitudinal plan of care) Comprehensive medication review performed; medication list updated in electronic medical record   Memory loss (Goal: Prevent decline) -Controlled -Current treatment  Donepezil 32m Appropriate, Effective, Safe, Accessible -Medications previously tried: none noted (recently increased dose of donepezil)  -Son reports that he has noticed a difference since increasing the dose of donepezil. He has around the clock care now and they report no concerns for safety.  He does not cook or drive.  Caretaker is there during the day and then family after working hours. Denies any GI concerns related to dose increase. -Recommended to continue current medication.  Hyperlipidemia: (LDL goal < 70) 12/13/21 Not at ideal goal last LDL 94 -Current treatment: Atorvastatin 20 mg once daily  -Current exercise habits: able to get out and go shopping or for walks with son without issue  -Current meal patterns: consistent meal pattern including post dialysis. No GI issues following donepezil start per son.  -Educated on Cholesterol goals;  consistent administration  -Counseled on diet and exercise extensively Recommended to  continue current medication, not going to make any changes due to patient age and memory loss.  Update 05/23/21 Most recent labs were excellent with the exception of kidney numbers which are being handled with dialysis.  Son does mention that he has seen decline in cognitive function and has asked if there is anything we can do with his Aricept. Will consult with PCP on increased dose to Donepezil 162mdaily. No issues with statin, he is active and still able to get out with son for walks, etc. Continue current medications at this time.  Diabetes (A1c goal <7%) -Controlled, not assessed today -ESRD - Thrice weekly incenter HD -Current medications: No current medications  -Current home glucose readings: not routinely testing due to well controlled. fasting glucose: n/a post prandial glucose: n/a -Denies hypoglycemic/hyperglycemic symptoms -Educated on A1c and blood sugar goals; -Counseled to check feet daily and get yearly eye exams -Recommended to continue current medication  Hyperphosphatemia (goal: ensure tx tolerability and affordability) -Controlled, not assessed today -Current medications: Aurxyia 1 gram three times daily (New) - son reports not taking -Reviewed tolerability - no problems. Son denies price being an issue -Does not appear to be enrolled in patient assistance program -Recommended to continue current medication and reach back out if cost becomes a concern  Update 05/23/21 Son not sure if he was still taking this - he was going to reach out to dialysis. Not on current medication list - he reports that dialysis mentioned something about him needing this a few weeks ago. He will reach out to dialysis center and let usKoreanow if anything to be done on our end.

## 2021-11-29 DIAGNOSIS — D689 Coagulation defect, unspecified: Secondary | ICD-10-CM | POA: Diagnosis not present

## 2021-11-29 DIAGNOSIS — N186 End stage renal disease: Secondary | ICD-10-CM | POA: Diagnosis not present

## 2021-11-29 DIAGNOSIS — N2581 Secondary hyperparathyroidism of renal origin: Secondary | ICD-10-CM | POA: Diagnosis not present

## 2021-11-29 DIAGNOSIS — Z992 Dependence on renal dialysis: Secondary | ICD-10-CM | POA: Diagnosis not present

## 2021-12-01 DIAGNOSIS — Z992 Dependence on renal dialysis: Secondary | ICD-10-CM | POA: Diagnosis not present

## 2021-12-01 DIAGNOSIS — N2581 Secondary hyperparathyroidism of renal origin: Secondary | ICD-10-CM | POA: Diagnosis not present

## 2021-12-01 DIAGNOSIS — D689 Coagulation defect, unspecified: Secondary | ICD-10-CM | POA: Diagnosis not present

## 2021-12-01 DIAGNOSIS — N186 End stage renal disease: Secondary | ICD-10-CM | POA: Diagnosis not present

## 2021-12-01 LAB — CUP PACEART REMOTE DEVICE CHECK
Battery Remaining Longevity: 156 mo
Battery Remaining Percentage: 100 %
Brady Statistic RA Percent Paced: 1 %
Brady Statistic RV Percent Paced: 2 %
Date Time Interrogation Session: 20230615005300
HighPow Impedance: 46 Ohm
Implantable Lead Implant Date: 20091218
Implantable Lead Implant Date: 20091218
Implantable Lead Location: 753859
Implantable Lead Location: 753860
Implantable Lead Model: 158
Implantable Lead Serial Number: 131093
Implantable Pulse Generator Implant Date: 20220523
Lead Channel Impedance Value: 436 Ohm
Lead Channel Impedance Value: 532 Ohm
Lead Channel Setting Pacing Amplitude: 2 V
Lead Channel Setting Pacing Amplitude: 2.4 V
Lead Channel Setting Pacing Pulse Width: 0.4 ms
Lead Channel Setting Sensing Sensitivity: 0.5 mV
Pulse Gen Serial Number: 232629

## 2021-12-04 DIAGNOSIS — N186 End stage renal disease: Secondary | ICD-10-CM | POA: Diagnosis not present

## 2021-12-04 DIAGNOSIS — D689 Coagulation defect, unspecified: Secondary | ICD-10-CM | POA: Diagnosis not present

## 2021-12-04 DIAGNOSIS — Z992 Dependence on renal dialysis: Secondary | ICD-10-CM | POA: Diagnosis not present

## 2021-12-04 DIAGNOSIS — N2581 Secondary hyperparathyroidism of renal origin: Secondary | ICD-10-CM | POA: Diagnosis not present

## 2021-12-05 ENCOUNTER — Ambulatory Visit (HOSPITAL_COMMUNITY)
Admission: RE | Admit: 2021-12-05 | Discharge: 2021-12-05 | Disposition: A | Discharge status: Home / Self Care | Payer: Medicare HMO | Source: Ambulatory Visit | Attending: Family Medicine | Admitting: Family Medicine

## 2021-12-05 DIAGNOSIS — R1312 Dysphagia, oropharyngeal phase: Secondary | ICD-10-CM | POA: Insufficient documentation

## 2021-12-05 DIAGNOSIS — K219 Gastro-esophageal reflux disease without esophagitis: Secondary | ICD-10-CM | POA: Diagnosis not present

## 2021-12-05 DIAGNOSIS — R1310 Dysphagia, unspecified: Secondary | ICD-10-CM | POA: Diagnosis not present

## 2021-12-05 DIAGNOSIS — R059 Cough, unspecified: Secondary | ICD-10-CM | POA: Diagnosis not present

## 2021-12-05 DIAGNOSIS — R1111 Vomiting without nausea: Secondary | ICD-10-CM

## 2021-12-05 NOTE — Therapy (Signed)
Modified Barium Swallow Progress Note  Patient Details  Name: Tyler Hahn MRN: [id number] Date of Birth: Jun 13, 1941  Today's Date: 12/05/2021  Modified Barium Swallow completed.  Full report located under Chart Review in the Imaging Section.  Brief recommendations include the following:  Clinical Impression  Patient presents with a mild oropharyngeal dysphagia as per this MBS. During oral phase, he exhibited delays in mastication and anterior to posterior transit of mechanical soft solids. During pharyngeal phase of swallow, patient exhibited swallow initiation delays at level of vallecular sinus with purees and mechanical soft solids and to level of pyriform sinus with thin liquids. No penetration or aspiration observed. Barium tablet transited pharyngeally and through UES without difficulty; esophageal sweep did not reveal any barium stasis or significant delays in transit but questionable stricture in distal esophagus (radiology PA noted dilation of esophagus above suspected stricture. SLP discussed results with patient and caregiver, recommending continue with current diet but provided them with information on mechanical soft solids, ensure patietn sitting upright during meals, to not eat quickly. No further SLP intervention or testing required but if patient continues with vomiting/regurgitation of food during or after eating, recommended consult with PCP and consider GI/esophageal assessment.   Swallow Evaluation Recommendations   Recommended Consults: Consider esophageal assessment;Consider GI evaluation   SLP Diet Recommendations: Regular solids;Dysphagia 3 (Mech soft) solids;Thin liquid   Liquid Administration via: Straw;Cup   Medication Administration: Whole meds with liquid       Compensations: Minimize environmental distractions;Slow rate;Small sips/bites   Postural Changes: Seated upright at 90 degrees   Oral Care Recommendations: Oral care BID        Sonia Baller, MA, CCC-SLP Speech Therapy

## 2021-12-06 ENCOUNTER — Other Ambulatory Visit: Payer: Self-pay | Admitting: Family Medicine

## 2021-12-06 DIAGNOSIS — N2581 Secondary hyperparathyroidism of renal origin: Secondary | ICD-10-CM | POA: Diagnosis not present

## 2021-12-06 DIAGNOSIS — D689 Coagulation defect, unspecified: Secondary | ICD-10-CM | POA: Diagnosis not present

## 2021-12-06 DIAGNOSIS — Z992 Dependence on renal dialysis: Secondary | ICD-10-CM | POA: Diagnosis not present

## 2021-12-06 DIAGNOSIS — N186 End stage renal disease: Secondary | ICD-10-CM | POA: Diagnosis not present

## 2021-12-07 ENCOUNTER — Other Ambulatory Visit: Payer: Self-pay | Admitting: Family Medicine

## 2021-12-08 DIAGNOSIS — Z992 Dependence on renal dialysis: Secondary | ICD-10-CM | POA: Diagnosis not present

## 2021-12-08 DIAGNOSIS — N2581 Secondary hyperparathyroidism of renal origin: Secondary | ICD-10-CM | POA: Diagnosis not present

## 2021-12-08 DIAGNOSIS — D689 Coagulation defect, unspecified: Secondary | ICD-10-CM | POA: Diagnosis not present

## 2021-12-08 DIAGNOSIS — N186 End stage renal disease: Secondary | ICD-10-CM | POA: Diagnosis not present

## 2021-12-11 DIAGNOSIS — N2581 Secondary hyperparathyroidism of renal origin: Secondary | ICD-10-CM | POA: Diagnosis not present

## 2021-12-11 DIAGNOSIS — E876 Hypokalemia: Secondary | ICD-10-CM | POA: Diagnosis not present

## 2021-12-11 DIAGNOSIS — Z992 Dependence on renal dialysis: Secondary | ICD-10-CM | POA: Diagnosis not present

## 2021-12-11 DIAGNOSIS — D689 Coagulation defect, unspecified: Secondary | ICD-10-CM | POA: Diagnosis not present

## 2021-12-11 DIAGNOSIS — N186 End stage renal disease: Secondary | ICD-10-CM | POA: Diagnosis not present

## 2021-12-11 NOTE — Progress Notes (Signed)
Remote ICD transmission.   

## 2021-12-12 ENCOUNTER — Ambulatory Visit (INDEPENDENT_AMBULATORY_CARE_PROVIDER_SITE_OTHER): Payer: Medicare HMO | Admitting: Pharmacist

## 2021-12-12 DIAGNOSIS — R413 Other amnesia: Secondary | ICD-10-CM

## 2021-12-12 DIAGNOSIS — E785 Hyperlipidemia, unspecified: Secondary | ICD-10-CM

## 2021-12-13 DIAGNOSIS — D689 Coagulation defect, unspecified: Secondary | ICD-10-CM | POA: Diagnosis not present

## 2021-12-13 DIAGNOSIS — Z992 Dependence on renal dialysis: Secondary | ICD-10-CM | POA: Diagnosis not present

## 2021-12-13 DIAGNOSIS — N2581 Secondary hyperparathyroidism of renal origin: Secondary | ICD-10-CM | POA: Diagnosis not present

## 2021-12-13 DIAGNOSIS — E876 Hypokalemia: Secondary | ICD-10-CM | POA: Diagnosis not present

## 2021-12-13 DIAGNOSIS — N186 End stage renal disease: Secondary | ICD-10-CM | POA: Diagnosis not present

## 2021-12-13 NOTE — Patient Instructions (Addendum)
Visit Information   Goals Addressed             This Visit's Progress    Manage My Medicine   On track    Timeframe:  Long-Range Goal Priority:  High Start Date:  05/23/21                           Expected End Date:  11/21/21                     Follow Up Date 08/21/21    - call for medicine refill 2 or 3 days before it runs out - call if I am sick and can't take my medicine - keep a list of all the medicines I take; vitamins and herbals too    Why is this important?   These steps will help you keep on track with your medicines.   Notes:        Patient Care Plan: CCM Pharmacy Care Plan     Problem Identified: DMII ESRD CHF (systolic) Hypothyroidism Memory loss   Priority: High     Long-Range Goal: Disease Management   Start Date: 11/22/2020  Expected End Date: 11/22/2021  Recent Progress: On track  Priority: High  Note:   Current Barriers:  Dementia   Pharmacist Clinical Goal(s):  Patient will contact provider office for questions/concerns as evidenced notation of same in electronic health record through collaboration with PharmD and provider.   Interventions: 1:1 collaboration with [name], MD regarding development and update of comprehensive plan of care as evidenced by provider attestation and co-signature Inter-disciplinary care team collaboration (see longitudinal plan of care) Comprehensive medication review performed; medication list updated in electronic medical record   Memory loss (Goal: Prevent decline) -Controlled -Current treatment  Donepezil '10mg'$  Appropriate, Effective, Safe, Accessible -Medications previously tried: none noted (recently increased dose of donepezil)  -Son reports that he has noticed a difference since increasing the dose of donepezil. He has around the clock care now and they report no concerns for safety.  He does not cook or drive.  Caretaker is there during the day and then family after working hours. Denies any  GI concerns related to dose increase. -Recommended to continue current medication.  Hyperlipidemia: (LDL goal < 70) 12/13/21 Not at ideal goal last LDL 94 -Current treatment: Atorvastatin 20 mg once daily  -Current exercise habits: able to get out and go shopping or for walks with son without issue  -Current meal patterns: consistent meal pattern including post dialysis. No GI issues following donepezil start per son.  -Educated on Cholesterol goals;  consistent administration  -Counseled on diet and exercise extensively Recommended to continue current medication, not going to make any changes due to patient age and memory loss.  Update 05/23/21 Most recent labs were excellent with the exception of kidney numbers which are being handled with dialysis.  Son does mention that he has seen decline in cognitive function and has asked if there is anything we can do with his Aricept. Will consult with PCP on increased dose to Donepezil '10mg'$  daily. No issues with statin, he is active and still able to get out with son for walks, etc. Continue current medications at this time.  Diabetes (A1c goal <7%) -Controlled, not assessed today -ESRD - Thrice weekly incenter HD -Current medications: No current medications  -Current home glucose readings: not routinely testing due to well controlled. fasting glucose: n/a post prandial  glucose: n/a -Denies hypoglycemic/hyperglycemic symptoms -Educated on A1c and blood sugar goals; -Counseled to check feet daily and get yearly eye exams -Recommended to continue current medication  Hyperphosphatemia (goal: ensure tx tolerability and affordability) -Controlled, not assessed today -Current medications: Aurxyia 1 gram three times daily (New) - son reports not taking -Reviewed tolerability - no problems. Son denies price being an issue -Does not appear to be enrolled in patient assistance program -Recommended to continue current medication and reach back  out if cost becomes a concern  Update 05/23/21 Son not sure if he was still taking this - he was going to reach out to dialysis. Not on current medication list - he reports that dialysis mentioned something about him needing this a few weeks ago. He will reach out to dialysis center and let us know if anything to be done on our end.         The patient verbalized understanding of instructions, educational materials, and care plan provided today and DECLINED offer to receive copy of patient instructions, educational materials, and care plan.  Telephone follow up appointment with pharmacy team member scheduled for: 6 months  Edythe Clarity, [place], PharmD Clinical Pharmacist  [facility] [phone]

## 2021-12-14 DIAGNOSIS — E785 Hyperlipidemia, unspecified: Secondary | ICD-10-CM | POA: Diagnosis not present

## 2021-12-14 DIAGNOSIS — E1169 Type 2 diabetes mellitus with other specified complication: Secondary | ICD-10-CM

## 2021-12-14 DIAGNOSIS — N186 End stage renal disease: Secondary | ICD-10-CM | POA: Diagnosis not present

## 2021-12-14 DIAGNOSIS — I129 Hypertensive chronic kidney disease with stage 1 through stage 4 chronic kidney disease, or unspecified chronic kidney disease: Secondary | ICD-10-CM | POA: Diagnosis not present

## 2021-12-14 DIAGNOSIS — Z992 Dependence on renal dialysis: Secondary | ICD-10-CM | POA: Diagnosis not present

## 2021-12-15 DIAGNOSIS — Z992 Dependence on renal dialysis: Secondary | ICD-10-CM | POA: Diagnosis not present

## 2021-12-15 DIAGNOSIS — N186 End stage renal disease: Secondary | ICD-10-CM | POA: Diagnosis not present

## 2021-12-15 DIAGNOSIS — D689 Coagulation defect, unspecified: Secondary | ICD-10-CM | POA: Diagnosis not present

## 2021-12-15 DIAGNOSIS — N2581 Secondary hyperparathyroidism of renal origin: Secondary | ICD-10-CM | POA: Diagnosis not present

## 2021-12-18 DIAGNOSIS — Z992 Dependence on renal dialysis: Secondary | ICD-10-CM | POA: Diagnosis not present

## 2021-12-18 DIAGNOSIS — N2581 Secondary hyperparathyroidism of renal origin: Secondary | ICD-10-CM | POA: Diagnosis not present

## 2021-12-18 DIAGNOSIS — N186 End stage renal disease: Secondary | ICD-10-CM | POA: Diagnosis not present

## 2021-12-18 DIAGNOSIS — E876 Hypokalemia: Secondary | ICD-10-CM | POA: Diagnosis not present

## 2021-12-18 DIAGNOSIS — D689 Coagulation defect, unspecified: Secondary | ICD-10-CM | POA: Diagnosis not present

## 2021-12-20 DIAGNOSIS — Z992 Dependence on renal dialysis: Secondary | ICD-10-CM | POA: Diagnosis not present

## 2021-12-20 DIAGNOSIS — N2581 Secondary hyperparathyroidism of renal origin: Secondary | ICD-10-CM | POA: Diagnosis not present

## 2021-12-20 DIAGNOSIS — D689 Coagulation defect, unspecified: Secondary | ICD-10-CM | POA: Diagnosis not present

## 2021-12-20 DIAGNOSIS — E876 Hypokalemia: Secondary | ICD-10-CM | POA: Diagnosis not present

## 2021-12-20 DIAGNOSIS — N186 End stage renal disease: Secondary | ICD-10-CM | POA: Diagnosis not present

## 2021-12-22 DIAGNOSIS — D689 Coagulation defect, unspecified: Secondary | ICD-10-CM | POA: Diagnosis not present

## 2021-12-22 DIAGNOSIS — Z992 Dependence on renal dialysis: Secondary | ICD-10-CM | POA: Diagnosis not present

## 2021-12-22 DIAGNOSIS — N2581 Secondary hyperparathyroidism of renal origin: Secondary | ICD-10-CM | POA: Diagnosis not present

## 2021-12-22 DIAGNOSIS — N186 End stage renal disease: Secondary | ICD-10-CM | POA: Diagnosis not present

## 2021-12-22 DIAGNOSIS — E876 Hypokalemia: Secondary | ICD-10-CM | POA: Diagnosis not present

## 2021-12-25 DIAGNOSIS — D689 Coagulation defect, unspecified: Secondary | ICD-10-CM | POA: Diagnosis not present

## 2021-12-25 DIAGNOSIS — N2581 Secondary hyperparathyroidism of renal origin: Secondary | ICD-10-CM | POA: Diagnosis not present

## 2021-12-25 DIAGNOSIS — Z992 Dependence on renal dialysis: Secondary | ICD-10-CM | POA: Diagnosis not present

## 2021-12-25 DIAGNOSIS — E1122 Type 2 diabetes mellitus with diabetic chronic kidney disease: Secondary | ICD-10-CM | POA: Diagnosis not present

## 2021-12-25 DIAGNOSIS — D631 Anemia in chronic kidney disease: Secondary | ICD-10-CM | POA: Diagnosis not present

## 2021-12-25 DIAGNOSIS — N186 End stage renal disease: Secondary | ICD-10-CM | POA: Diagnosis not present

## 2021-12-26 ENCOUNTER — Ambulatory Visit (INDEPENDENT_AMBULATORY_CARE_PROVIDER_SITE_OTHER): Payer: Medicare HMO | Admitting: Neurology

## 2021-12-26 ENCOUNTER — Ambulatory Visit (INDEPENDENT_AMBULATORY_CARE_PROVIDER_SITE_OTHER): Payer: Medicare HMO

## 2021-12-26 ENCOUNTER — Encounter: Payer: Self-pay | Admitting: Neurology

## 2021-12-26 VITALS — BP 112/61 | HR 71 | Ht 67.0 in | Wt 173.0 lb

## 2021-12-26 DIAGNOSIS — Z0000 Encounter for general adult medical examination without abnormal findings: Secondary | ICD-10-CM | POA: Diagnosis not present

## 2021-12-26 DIAGNOSIS — F02B0 Dementia in other diseases classified elsewhere, moderate, without behavioral disturbance, psychotic disturbance, mood disturbance, and anxiety: Secondary | ICD-10-CM | POA: Diagnosis not present

## 2021-12-26 DIAGNOSIS — G301 Alzheimer's disease with late onset: Secondary | ICD-10-CM

## 2021-12-26 DIAGNOSIS — R69 Illness, unspecified: Secondary | ICD-10-CM | POA: Diagnosis not present

## 2021-12-26 MED ORDER — MEMANTINE HCL 10 MG PO TABS: 10.0000 mg | ORAL_TABLET | Freq: Two times a day (BID) | ORAL | 11 refills | Status: DC

## 2021-12-26 NOTE — Patient Instructions (Addendum)
Continue current medications  Start Namenda 10 mg twice daily, start with 1 tablet daily for 2 weeks then increase to 1 tab twice daily  Continue to follow up with PCP  Return to clinic in 1 year of sooner if worse    There are well-accepted and sensible ways to reduce risk for Alzheimers disease and other degenerative brain disorders .  Exercise Daily Walk A daily 20 minute walk should be part of your routine. Disease related apathy can be a significant roadblock to exercise and the only way to overcome this is to make it a daily routine and perhaps have a reward at the end (something your loved one loves to eat or drink perhaps) or a personal trainer coming to the home can also be very useful. Most importantly, the patient is much more likely to exercise if the caregiver / spouse does it with him/her. In general a structured, repetitive schedule is best.  General Health: Any diseases which effect your body will effect your brain such as a pneumonia, urinary infection, blood clot, heart attack or stroke. Keep contact with your primary care doctor for regular follow ups.  Sleep. A good nights sleep is healthy for the brain. Seven hours is recommended. If you have insomnia or poor sleep habits we can give you some instructions. If you have sleep apnea wear your mask.  Diet: Eating a heart healthy diet is also a good idea; fish and poultry instead of red meat, nuts (mostly non-peanuts), vegetables, fruits, olive oil or canola oil (instead of butter), minimal salt (use other spices to flavor foods), whole grain rice, bread, cereal and pasta and wine in moderation.Research is now showing that the MIND diet, which is a combination of The Mediterranean diet and the DASH diet, is beneficial for cognitive processing and longevity. Information about this diet can be found in The MIND Diet, a book by Doyne Keel, MS, RDN, and online at NotebookDistributors.si  Finances, Power of Attorney  and Advance Directives: You should consider putting legal safeguards in place with regard to financial and medical decision making. While the spouse always has power of attorney for medical and financial issues in the absence of any form, you should consider what you want in case the spouse / caregiver is no longer around or capable of making decisions.

## 2021-12-26 NOTE — Progress Notes (Signed)
Subjective:   Tyler Hahn is a 81 y.o. male who presents for an Subsequent  Medicare Annual Wellness Visit.    I connected with Tyler Hahn  today by telephone and verified that I am speaking with the correct person using two identifiers. Location patient: home Location provider: work Persons participating in the virtual visit: patient, provider.   I discussed the limitations, risks, security and privacy concerns of performing an evaluation and management service by telephone and the availability of in person appointments. I also discussed with the patient that there may be a patient responsible charge related to this service. The patient expressed understanding and verbally consented to this telephonic visit.    Interactive audio and video telecommunications were attempted between this provider and patient, however failed, due to patient having technical difficulties OR patient did not have access to video capability.  We continued and completed visit with audio only.    Review of Systems     Cardiac Risk Factors include: advanced age (>65mn, >>56women);male gender     Objective:    Today's Vitals   There is no height or weight on file to calculate BMI.     12/26/2021   10:02 AM 11/06/2020    8:18 AM 10/16/2020    9:59 AM 04/25/2020    1:06 AM 09/11/2019    1:56 PM 08/30/2019    4:00 PM 08/17/2019    4:35 PM  Advanced Directives  Does Patient Have a Medical Advance Directive? Yes No No No No No No  Type of [place]        Copy of [place]? No - copy requested        Would patient like information on creating a medical advance directive?  No - Patient declined No - Patient declined No - Patient declined No - Patient declined No - Patient declined No - Patient declined    Current Medications (verified) Outpatient Encounter Medications as of 12/26/2021  Medication Sig   allopurinol (ZYLOPRIM)  100 MG tablet TAKE 1 TABLET BY MOUTH 3 TIMES A WEEK ON MONDAY, WEDNESDAY AND FRIDAY   atorvastatin (LIPITOR) 20 MG tablet TAKE 1 TABLET BY MOUTH EVERY DAY   bacitracin ointment Apply 1 application. topically 2 (two) times daily.   donepezil (ARICEPT) 10 MG tablet Take 1 tablet (10 mg total) by mouth at bedtime.   hydrocortisone cream 1 % Apply to affected area 2 times daily   levothyroxine (SYNTHROID) 25 MCG tablet TAKE 1 TABLET BY MOUTH EVERY DAY BEFORE BREAKFAST   loratadine (CLARITIN) 10 MG tablet Take 1 tablet (10 mg total) by mouth daily.   Multiple Vitamins-Minerals (MULTIVITAMIN WITH MINERALS) tablet Take 1 tablet by mouth daily.   omeprazole (PRILOSEC) 40 MG capsule Take 1 capsule (40 mg total) by mouth daily.   OVER THE COUNTER MEDICATION Take 1 capsule by mouth daily. Brain Food   sertraline (ZOLOFT) 100 MG tablet TAKE 1 TABLET BY MOUTH EVERY DAY   trimethoprim-polymyxin b (POLYTRIM) ophthalmic solution Place 1 drop into both eyes 2 (two) times daily.   No facility-administered encounter medications on file as of 12/26/2021.    Allergies (verified) Sulfa antibiotics and Sulfonamide derivatives   History: Past Medical History:  Diagnosis Date   Anxiety    Automatic implantable cardioverter-defibrillator in situ    greg taylor   CHF (congestive heart failure) ([place]    2000   Diabetes mellitus    no  meds   Fatty liver    Gout    "bout 2-3 months ago"-meds helped.   Hyperlipidemia    Hypertension    Hyperthyroidism    Kidney cysts    PONV (postoperative nausea and vomiting)    Renal insufficiency    Past Surgical History:  Procedure Laterality Date   AV FISTULA PLACEMENT Right 08/23/2019   Procedure: ARTERIOVENOUS GORTEX GRAFT RIGHT ARM;  Surgeon: Tyler Posner, MD;  Location: [facility] OR;  Service: Vascular;  Laterality: Right;   CARDIAC DEFIBRILLATOR PLACEMENT     COLONOSCOPY WITH PROPOFOL N/A 10/03/2015   Procedure: COLONOSCOPY WITH PROPOFOL;  Surgeon: Ladene Artist, MD;   Location: [facility];  Service: Endoscopy;  Laterality: N/A;   DIAGNOSTIC LAPAROSCOPY  01/27/2014   Dr Brantley Stage   ENTEROSCOPY N/A 11/25/2013   Procedure: ENTEROSCOPY;  Surgeon: Ladene Artist, MD;  Location: [facility];  Service: Endoscopy;  Laterality: N/A;   ICD GENERATOR CHANGEOUT N/A 11/06/2020   Procedure: ICD GENERATOR CHANGEOUT;  Surgeon: [name], MD;  Location: [place] [facility];  Service: Cardiovascular;  Laterality: N/A;   ICD,[place] [employer]     LAPAROSCOPY N/A 01/27/2014   Procedure: LAPAROSCOPY DIAGNOSTIC;  Surgeon: Joyice Faster. Cornett, MD;  Location: [place] OR;  Service: General;  Laterality: N/A;   polyp removal throat     difficulty speaking   Family History  Problem Relation Age of Onset   Stomach cancer Mother    Alzheimer's disease Mother    Diabetes Father    Heart disease Father    Liver disease Father    Kidney disease Father    Social History   Socioeconomic History   Marital status: Widowed    Spouse name: Not on file   Number of children: 4   Years of education: Not on file   Highest education level: Not on file  Occupational History   Occupation: Retired    Fish farm manager: RETIRED  Tobacco Use   Smoking status: Former    Types: Cigarettes    Quit date: 11/26/1998    Years since quitting: 23.0   Smokeless tobacco: Never  Vaping Use   Vaping Use: Never used  Substance and Sexual Activity   Alcohol use: No    Alcohol/week: 0.0 standard drinks of alcohol   Drug use: No   Sexual activity: Not on file  Other Topics Concern   Not on file  Social History Narrative   Not on file   Social Determinants of Health   Financial Resource Strain: Low Risk  (12/26/2021)   Overall Financial Resource Strain (CARDIA)    Difficulty of Paying Living Expenses: Not hard at all  Food Insecurity: No Food Insecurity (12/26/2021)   Hunger Vital Sign    Worried About Running Out of Food in the Last Year: Never true    [place] in the Last Year: Never true   Transportation Needs: No Transportation Needs (12/26/2021)   PRAPARE - Hydrologist (Medical): No    Lack of Transportation (Non-Medical): No  Physical Activity: Insufficiently Active (12/26/2021)   Exercise Vital Sign    Days of Exercise per Week: 3 days    Minutes of Exercise per Session: 30 min  Stress: No Stress Concern Present (12/26/2021)   [place]    Feeling of Stress : Not at all  Social Connections: Moderately Integrated (12/26/2021)   Social Connection and Isolation Panel [NHANES]  Frequency of Communication with Friends and Family: Three times a week    Frequency of Social Gatherings with Friends and Family: Three times a week    Attends Religious Services: 1 to 4 times per year    Active Member of Clubs or Organizations: Yes    Attends Archivist Meetings: 1 to 4 times per year    Marital Status: Widowed    Tobacco Counseling Counseling given: Not Answered   Clinical Intake:  Pre-visit preparation completed: Yes  Pain : No/denies pain     Nutritional Risks: None Diabetes: No  How often do you need to have someone help you when you read instructions, pamphlets, or other written materials from your doctor or pharmacy?: 1 - Never What is the last grade level you completed in school?: college  Diabetic?no   Interpreter Needed?: No  Information entered by :: L.Elvina Bosch,LPN   Activities of Daily Living    12/26/2021   10:05 AM 02/26/2021   10:11 AM  In your present state of health, do you have any difficulty performing the following activities:  Hearing? 0 0  Vision? 0 1  Difficulty concentrating or making decisions? 0 0  Walking or climbing stairs? 0 0  Dressing or bathing? 0 0  Doing errands, shopping? 0 0  Preparing Food and eating ? N   Using the Toilet? N   In the past six months, have you accidently leaked urine? N   Do you have problems with  loss of bowel control? N   Managing your Medications? N   Managing your Finances? N   Housekeeping or managing your Housekeeping? N     Patient Care Team: [name], MD as PCP - General [name], MD as Consulting Physician (Cardiology) Renato Shin, MD (Inactive) as Consulting Physician (Endocrinology) Ladene Artist, MD as Consulting Physician (Gastroenterology) Rosita Fire, MD as Consulting Physician (Nephrology) Edythe Clarity, [facility] (Pharmacist)  Indicate any recent Medical Services you may have received from other than [facility] providers in the past year (date may be approximate).     Assessment:   This is a routine wellness examination for Jarred.  Hearing/Vision screen Vision Screening - Comments:: Annual eye exams wear glasses   Dietary issues and exercise activities discussed: Current Exercise Habits: The patient does not participate in regular exercise at present, Exercise limited by: None identified   Goals Addressed   None    Depression Screen    12/26/2021   10:03 AM 12/26/2021   10:01 AM 11/26/2021   11:17 AM 08/03/2021   12:32 PM 02/26/2021   10:11 AM 11/24/2020    8:36 AM 10/16/2020   10:00 AM  PHQ 2/9 Scores  PHQ - 2 Score 0 0 1 0 0 0 0  PHQ- 9 Score   3 2 0 0     Fall Risk    12/26/2021   10:02 AM 11/26/2021   11:18 AM 02/26/2021   10:11 AM 11/24/2020    8:36 AM 10/16/2020   10:00 AM  Fall Risk   Falls in the past year? 0 1 0 0 0  Number falls in past yr: 0 0 0 0 0  Injury with Fall? 0 0 0 0 0  Risk for fall due to : No Fall Risks History of fall(s) No Fall Risks No Fall Risks   Follow up Falls evaluation completed;Education provided Falls evaluation completed   Falls prevention discussed    FALL RISK PREVENTION PERTAINING TO THE  HOME:  Any stairs in or around the home? No  If so, are there any without handrails? No  Home free of loose throw rugs in walkways, pet beds, electrical cords, etc? Yes  Adequate lighting in  your home to reduce risk of falls? Yes   ASSISTIVE DEVICES UTILIZED TO PREVENT FALLS:  Life alert? No  Use of a cane, walker or w/c? No  Grab bars in the bathroom? Yes  Shower chair or bench in shower? No  Elevated toilet seat or a handicapped toilet? No   Cognitive Function: Normal cognitive status assessed by telephone conversation by this Nurse Health Advisor. No abnormalities found.       02/14/2015   10:17 AM  MMSE - Mini Mental State Exam  Orientation to time 5  Orientation to Place 5  Registration 3  Attention/ Calculation 4  Recall 2  Language- name 2 objects 2  Language- repeat 1  Language- follow 3 step command 3  Language- read & follow direction 1  Write a sentence 1  Copy design 1  Total score 28        10/16/2020   10:05 AM  6CIT Screen  What Year? 4 points  What month? 3 points  What time? 3 points  Count back from 20 0 points  Months in reverse 4 points  Repeat phrase 10 points  Total Score 24 points    Immunizations Immunization History  Administered Date(s) Administered   Fluad Quad(high Dose 65+) 02/18/2019, 02/16/2020, 02/26/2021   Influenza Whole 04/04/2010, 06/17/2012   Influenza, High Dose Seasonal PF 03/18/2018   Influenza,inj,Quad PF,6+ Mos 03/24/2014, 05/18/2015, 07/04/2017   Moderna Sars-Covid-2 Vaccination 07/20/2019, 08/21/2019   Pneumococcal Conjugate-13 02/14/2015   Pneumococcal Polysaccharide-23 08/15/2011    TDAP status: Due, Education has been provided regarding the importance of this vaccine. Advised may receive this vaccine at local pharmacy or Health Dept. Aware to provide a copy of the vaccination record if obtained from local pharmacy or Health Dept. Verbalized acceptance and understanding.  Flu Vaccine status: Up to date  Pneumococcal vaccine status: Up to date  Covid-19 vaccine status: Completed vaccines  Qualifies for Shingles Vaccine? Yes   Zostavax completed No   Shingrix Completed?: No.    Education has been  provided regarding the importance of this vaccine. Patient has been advised to call insurance company to determine out of pocket expense if they have not yet received this vaccine. Advised may also receive vaccine at local pharmacy or Health Dept. Verbalized acceptance and understanding.  Screening Tests Health Maintenance  Topic Date Due   OPHTHALMOLOGY EXAM  08/25/2017   FOOT EXAM  11/24/2021   INFLUENZA VACCINE  01/15/2022   HEMOGLOBIN A1C  01/31/2022   Pneumonia Vaccine 18+ Years old  Completed   Hepatitis C Screening  Completed   HPV VACCINES  Aged Out   TETANUS/TDAP  Discontinued   COVID-19 Vaccine  Discontinued   Zoster Vaccines- Shingrix  Discontinued    Health Maintenance  Health Maintenance Due  Topic Date Due   OPHTHALMOLOGY EXAM  08/25/2017   FOOT EXAM  11/24/2021    Colorectal cancer screening: No longer required.   Lung Cancer Screening: (Low Dose CT Chest recommended if Age 69-80 years, 30 pack-year currently smoking OR have quit w/in 15years.) does not qualify.   Lung Cancer Screening Referral: n/a  Additional Screening:  Hepatitis C Screening: does not qualify;   Vision Screening: Recommended annual ophthalmology exams for early detection of glaucoma and other disorders of  the eye. Is the patient up to date with their annual eye exam?  Yes  Who is the provider or what is the name of the office in which the patient attends annual eye exams? Dr.Groat  If pt is not established with a provider, would they like to be referred to a provider to establish care? No .   Dental Screening: Recommended annual dental exams for proper oral hygiene  Community Resource Referral / Chronic Care Management: CRR required this visit?  No   CCM required this visit?  No      Plan:     I have personally reviewed and noted the following in the patient's chart:   Medical and social history Use of alcohol, tobacco or illicit drugs  Current medications and supplements  including opioid prescriptions. Patient is not currently taking opioid prescriptions. Functional ability and status Nutritional status Physical activity Advanced directives List of other physicians Hospitalizations, surgeries, and ER visits in previous 12 months Vitals Screenings to include cognitive, depression, and falls Referrals and appointments  In addition, I have reviewed and discussed with patient certain preventive protocols, quality metrics, and best practice recommendations. A written personalized care plan for preventive services as well as general preventive health recommendations were provided to patient.     Randel Pigg, LPN   [id number]   Nurse Notes: none

## 2021-12-26 NOTE — Patient Instructions (Signed)
Tyler Hahn , Thank you for taking time to come for your Medicare Wellness Visit. I appreciate your ongoing commitment to your health goals. Please review the following plan we discussed and let me know if I can assist you in the future.   Screening recommendations/referrals: Colonoscopy: no longer required  Recommended yearly ophthalmology/optometry visit for glaucoma screening and checkup Recommended yearly dental visit for hygiene and checkup  Vaccinations: Influenza vaccine: declined  Pneumococcal vaccine: completed  Tdap vaccine: due  Shingles vaccine: will consider     Advanced directives: yes   Conditions/risks identified: none   Next appointment: none   Preventive Care 48 Years and Older, Male Preventive care refers to lifestyle choices and visits with your health care provider that can promote health and wellness. What does preventive care include? A yearly physical exam. This is also called an annual well check. Dental exams once or twice a year. Routine eye exams. Ask your health care provider how often you should have your eyes checked. Personal lifestyle choices, including: Daily care of your teeth and gums. Regular physical activity. Eating a healthy diet. Avoiding tobacco and drug use. Limiting alcohol use. Practicing safe sex. Taking low doses of aspirin every day. Taking vitamin and mineral supplements as recommended by your health care provider. What happens during an annual well check? The services and screenings done by your health care provider during your annual well check will depend on your age, overall health, lifestyle risk factors, and family history of disease. Counseling  Your health care provider may ask you questions about your: Alcohol use. Tobacco use. Drug use. Emotional well-being. Home and relationship well-being. Sexual activity. Eating habits. History of falls. Memory and ability to understand (cognition). Work and work  Statistician. Screening  You may have the following tests or measurements: Height, weight, and BMI. Blood pressure. Lipid and cholesterol levels. These may be checked every 5 years, or more frequently if you are over 46 years old. Skin check. Lung cancer screening. You may have this screening every year starting at age 70 if you have a 30-pack-year history of smoking and currently smoke or have quit within the past 15 years. Fecal occult blood test (FOBT) of the stool. You may have this test every year starting at age 61. Flexible sigmoidoscopy or colonoscopy. You may have a sigmoidoscopy every 5 years or a colonoscopy every 10 years starting at age 66. Prostate cancer screening. Recommendations will vary depending on your family history and other risks. Hepatitis C blood test. Hepatitis B blood test. Sexually transmitted disease (STD) testing. Diabetes screening. This is done by checking your blood sugar (glucose) after you have not eaten for a while (fasting). You may have this done every 1-3 years. Abdominal aortic aneurysm (AAA) screening. You may need this if you are a current or former smoker. Osteoporosis. You may be screened starting at age 19 if you are at high risk. Talk with your health care provider about your test results, treatment options, and if necessary, the need for more tests. Vaccines  Your health care provider may recommend certain vaccines, such as: Influenza vaccine. This is recommended every year. Tetanus, diphtheria, and acellular pertussis (Tdap, Td) vaccine. You may need a Td booster every 10 years. Zoster vaccine. You may need this after age 64. Pneumococcal 13-valent conjugate (PCV13) vaccine. One dose is recommended after age 41. Pneumococcal polysaccharide (PPSV23) vaccine. One dose is recommended after age 28. Talk to your health care provider about which screenings and vaccines you need  and how often you need them. This information is not intended to replace  advice given to you by your health care provider. Make sure you discuss any questions you have with your health care provider. Document Released: 06/30/2015 Document Revised: 02/21/2016 Document Reviewed: 04/04/2015 Elsevier Interactive Patient Education  2017 [place] Prevention in the Home Falls can cause injuries. They can happen to people of all ages. There are many things you can do to make your home safe and to help prevent falls. What can I do on the outside of my home? Regularly fix the edges of walkways and driveways and fix any cracks. Remove anything that might make you trip as you walk through a door, such as a raised step or threshold. Trim any bushes or trees on the path to your home. Use bright outdoor lighting. Clear any walking paths of anything that might make someone trip, such as rocks or tools. Regularly check to see if handrails are loose or broken. Make sure that both sides of any steps have handrails. Any raised decks and porches should have guardrails on the edges. Have any leaves, snow, or ice cleared regularly. Use sand or salt on walking paths during winter. Clean up any spills in your garage right away. This includes oil or grease spills. What can I do in the bathroom? Use night lights. Install grab bars by the toilet and in the tub and shower. Do not use towel bars as grab bars. Use non-skid mats or decals in the tub or shower. If you need to sit down in the shower, use a plastic, non-slip stool. Keep the floor dry. Clean up any water that spills on the floor as soon as it happens. Remove soap buildup in the tub or shower regularly. Attach bath mats securely with double-sided non-slip rug tape. Do not have throw rugs and other things on the floor that can make you trip. What can I do in the bedroom? Use night lights. Make sure that you have a light by your bed that is easy to reach. Do not use any sheets or blankets that are too big for your bed.  They should not hang down onto the floor. Have a firm chair that has side arms. You can use this for support while you get dressed. Do not have throw rugs and other things on the floor that can make you trip. What can I do in the kitchen? Clean up any spills right away. Avoid walking on wet floors. Keep items that you use a lot in easy-to-reach places. If you need to reach something above you, use a strong step stool that has a grab bar. Keep electrical cords out of the way. Do not use floor polish or wax that makes floors slippery. If you must use wax, use non-skid floor wax. Do not have throw rugs and other things on the floor that can make you trip. What can I do with my stairs? Do not leave any items on the stairs. Make sure that there are handrails on both sides of the stairs and use them. Fix handrails that are broken or loose. Make sure that handrails are as long as the stairways. Check any carpeting to make sure that it is firmly attached to the stairs. Fix any carpet that is loose or worn. Avoid having throw rugs at the top or bottom of the stairs. If you do have throw rugs, attach them to the floor with carpet tape. Make sure that you  have a light switch at the top of the stairs and the bottom of the stairs. If you do not have them, ask someone to add them for you. What else can I do to help prevent falls? Wear shoes that: Do not have high heels. Have rubber bottoms. Are comfortable and fit you well. Are closed at the toe. Do not wear sandals. If you use a stepladder: Make sure that it is fully opened. Do not climb a closed stepladder. Make sure that both sides of the stepladder are locked into place. Ask someone to hold it for you, if possible. Clearly mark and make sure that you can see: Any grab bars or handrails. First and last steps. Where the edge of each step is. Use tools that help you move around (mobility aids) if they are needed. These  include: Canes. Walkers. Scooters. Crutches. Turn on the lights when you go into a dark area. Replace any light bulbs as soon as they burn out. Set up your furniture so you have a clear path. Avoid moving your furniture around. If any of your floors are uneven, fix them. If there are any pets around you, be aware of where they are. Review your medicines with your doctor. Some medicines can make you feel dizzy. This can increase your chance of falling. Ask your doctor what other things that you can do to help prevent falls. This information is not intended to replace advice given to you by your health care provider. Make sure you discuss any questions you have with your health care provider. Document Released: 03/30/2009 Document Revised: 11/09/2015 Document Reviewed: 07/08/2014 Elsevier Interactive Patient Education  2017 [employer].

## 2021-12-26 NOTE — Progress Notes (Signed)
[facility]  PATIENT: Tyler Hahn DOB: 1940-12-01  REQUESTING CLINICIAN: [name], MD HISTORY FROM: Patient and caregiver REASON FOR VISIT: Worsening memory    HISTORICAL  CHIEF COMPLAINT:  Chief Complaint  Patient presents with   New Patient (Initial Visit)    Rm 17, with caretaker  NP Internal referral for memory loss and hallucinations    HISTORY OF PRESENT ILLNESS:  This is a 81 year old man with past medical history of hypothyroidism, memory deficit, hyperlipidemia and depression who is presenting with worsening memory.  Per caregiver, patient had memory problem for the past 4 years, getting worse in the last 6 months.  He has difficulty remembering recent conversations, misplacing items, he needs constant reminder.  He does not drive, the last time he drove was more than 4 years.  Caregiver reports that patient's wife recently passed away 2 years ago but he still talks about her like she is still alive.  She does report that at times patient will hear the wife voices or see her in the house.  He also has been complaining of seeing flies in his vision field but caregiver thinks these may be floaters.  He did follow-up with a ophthalmologist, diagnosed with bilateral cataracts and and set up to have cataract surgery next month.    TBI:   No past history of TBI Stroke:   no past history of stroke Seizures:   no past history of seizures Sleep:   no history of sleep apnea.  Mood: Yes,  patient has a diagnosis of depression  Functional status: Dependent in most ADLs and IADLs Patient lives alone but has a 24 hrs care 5 days a week Cooking: no Cleaning: no Shopping: no Bathing: patient  Toileting: patient Driving: no Bills: son  Ever left the stove on by accident?: Not applicable  Forget how to use items around the house?: Yes  Getting lost going to familiar places?: Yes  Forgetting loved ones names?: No  Word finding difficulty? Yes   Sleep: good    OTHER MEDICAL CONDITIONS: Dementia, hypothyroidism, hyperlipidemia and depression    REVIEW OF SYSTEMS: Full 14 system review of systems performed and negative with exception of: as noted in the HPI.  ALLERGIES: Allergies  Allergen Reactions   Sulfa Antibiotics Itching and Rash   Sulfonamide Derivatives Itching and Rash    HOME MEDICATIONS: Outpatient Medications Prior to Visit  Medication Sig Dispense Refill   allopurinol (ZYLOPRIM) 100 MG tablet TAKE 1 TABLET BY MOUTH 3 TIMES A WEEK ON MONDAY, WEDNESDAY AND FRIDAY 12 tablet 2   atorvastatin (LIPITOR) 20 MG tablet TAKE 1 TABLET BY MOUTH EVERY DAY 30 tablet 5   bacitracin ointment Apply 1 application. topically 2 (two) times daily. 120 g 0   donepezil (ARICEPT) 10 MG tablet Take 1 tablet (10 mg total) by mouth at bedtime. 90 tablet 3   hydrocortisone cream 1 % Apply to affected area 2 times daily 15 g 0   levothyroxine (SYNTHROID) 25 MCG tablet TAKE 1 TABLET BY MOUTH EVERY DAY BEFORE BREAKFAST 30 tablet 5   loratadine (CLARITIN) 10 MG tablet Take 1 tablet (10 mg total) by mouth daily. 30 tablet 0   Multiple Vitamins-Minerals (MULTIVITAMIN WITH MINERALS) tablet Take 1 tablet by mouth daily.     omeprazole (PRILOSEC) 40 MG capsule Take 1 capsule (40 mg total) by mouth daily. 30 capsule 3   OVER THE COUNTER MEDICATION Take 1 capsule by mouth daily. Brain Food  sertraline (ZOLOFT) 100 MG tablet TAKE 1 TABLET BY MOUTH EVERY DAY 30 tablet 2   trimethoprim-polymyxin b (POLYTRIM) ophthalmic solution Place 1 drop into both eyes 2 (two) times daily. 10 mL 0   No facility-administered medications prior to visit.    PAST MEDICAL HISTORY: Past Medical History:  Diagnosis Date   Anxiety    Automatic implantable cardioverter-defibrillator in situ    greg taylor   CHF (congestive heart failure) ([place])    2000   Diabetes mellitus    no meds   Fatty liver    Gout    "bout 2-3 months ago"-meds helped.   Hyperlipidemia     Hypertension    Hyperthyroidism    Kidney cysts    PONV (postoperative nausea and vomiting)    Renal insufficiency     PAST SURGICAL HISTORY: Past Surgical History:  Procedure Laterality Date   AV FISTULA PLACEMENT Right 08/23/2019   Procedure: ARTERIOVENOUS GORTEX GRAFT RIGHT ARM;  Surgeon: Rosetta Posner, MD;  [facility]: [facility];  Service: Vascular;  Laterality: Right;   CARDIAC DEFIBRILLATOR PLACEMENT     COLONOSCOPY WITH PROPOFOL N/A 10/03/2015   Procedure: COLONOSCOPY WITH PROPOFOL;  Surgeon: Ladene Artist, MD;  [facility]: [facility];  Service: Endoscopy;  Laterality: N/A;   DIAGNOSTIC LAPAROSCOPY  01/27/2014   Dr Brantley Stage   ENTEROSCOPY N/A 11/25/2013   Procedure: ENTEROSCOPY;  Surgeon: Ladene Artist, MD;  [facility];  Service: Endoscopy;  Laterality: N/A;   ICD GENERATOR CHANGEOUT N/A 11/06/2020   Procedure: ICD GENERATOR CHANGEOUT;  Surgeon: [name], MD;  Location: [facility];  Service: Cardiovascular;  Laterality: N/A;   ICD,[place] [employer]     LAPAROSCOPY N/A 01/27/2014   Procedure: LAPAROSCOPY DIAGNOSTIC;  Surgeon: Joyice Faster. Cornett, MD;  Location: [facility];  Service: General;  Laterality: N/A;   polyp removal throat     difficulty speaking    FAMILY HISTORY: Family History  Problem Relation Age of Onset   Stomach cancer Mother    Alzheimer's disease Mother    Diabetes Father    Heart disease Father    Liver disease Father    Kidney disease Father     SOCIAL HISTORY: Social History   Socioeconomic History   Marital status: Widowed    Spouse name: Not on file   Number of children: 4   Years of education: Not on file   Highest education level: Not on file  Occupational History   Occupation: Retired    Fish farm manager: RETIRED  Tobacco Use   Smoking status: Former    Types: Cigarettes    Quit date: 11/26/1998    Years since quitting: 23.0   Smokeless tobacco: Never  Vaping Use   Vaping Use: Never used  Substance and Sexual Activity    Alcohol use: No    Alcohol/week: 0.0 standard drinks of alcohol   Drug use: No   Sexual activity: Not on file  Other Topics Concern   Not on file  Social History Narrative   Not on file   Social Determinants of Health   Financial Resource Strain: Low Risk  (12/26/2021)   Overall Financial Resource Strain (CARDIA)    Difficulty of Paying Living Expenses: Not hard at all  Food Insecurity: No Food Insecurity (12/26/2021)   Hunger Vital Sign    Worried About Running Out of Food in the Last Year: Never true    [place] in the Last Year: Never true  Transportation Needs:  No Transportation Needs (12/26/2021)   PRAPARE - Hydrologist (Medical): No    Lack of Transportation (Non-Medical): No  Physical Activity: Insufficiently Active (12/26/2021)   Exercise Vital Sign    Days of Exercise per Week: 3 days    Minutes of Exercise per Session: 30 min  Stress: No Stress Concern Present (12/26/2021)   [place]    Feeling of Stress : Not at all  Social Connections: Moderately Integrated (12/26/2021)   Social Connection and Isolation Panel [NHANES]    Frequency of Communication with Friends and Family: Three times a week    Frequency of Social Gatherings with Friends and Family: Three times a week    Attends Religious Services: 1 to 4 times per year    Active Member of Clubs or Organizations: Yes    Attends Archivist Meetings: 1 to 4 times per year    Marital Status: Widowed  Intimate Partner Violence: Not At Risk (12/26/2021)   Humiliation, Afraid, Rape, and Kick questionnaire    Fear of Current or Ex-Partner: No    Emotionally Abused: No    Physically Abused: No    Sexually Abused: No    PHYSICAL EXAM  GENERAL EXAM/CONSTITUTIONAL: Vitals:  Vitals:   12/26/21 1513  BP: 112/61  Pulse: 71  Weight: 173 lb (78.5 kg)  Height: '5\' 7"'$  (1.702 m)   Body mass index is 27.1 kg/m. Wt  Readings from Last 3 Encounters:  12/26/21 173 lb (78.5 kg)  11/26/21 169 lb 3 oz (76.7 kg)  10/30/21 160 lb (72.6 kg)   Patient is in no distress; well developed, nourished and groomed; neck is supple  EYES: Pupils round and reactive to light, Visual fields full to confrontation, Extraocular movements intacts,   MUSCULOSKELETAL: Gait, strength, tone, movements noted in Neurologic exam below  NEUROLOGIC: MENTAL STATUS:     02/14/2015   10:17 AM  MMSE - Mini Mental State Exam  Orientation to time 5  Orientation to Place 5  Registration 3  Attention/ Calculation 4  Recall 2  Language- name 2 objects 2  Language- repeat 1  Language- follow 3 step command 3  Language- read & follow direction 1  Write a sentence 1  Copy design 1  Total score 28      12/26/2021    3:21 PM  Montreal Cognitive Assessment   Visuospatial/ Executive (0/5) 1  Naming (0/3) 3  Attention: Read list of digits (0/2) 1  Attention: Read list of letters (0/1) 1  Attention: Serial 7 subtraction starting at 100 (0/3) 0  Language: Repeat phrase (0/2) 1  Language : Fluency (0/1) 1  Abstraction (0/2) 1  Delayed Recall (0/5) 0  Orientation (0/6) 1  Total 10     CRANIAL NERVE:  2nd, 3rd, 4th, 6th - pupils equal and reactive to light, visual fields full to confrontation, extraocular muscles intact, no nystagmus 5th - facial sensation symmetric 7th - facial strength symmetric 8th - hearing intact 9th - palate elevates symmetrically, uvula midline 11th - shoulder shrug symmetric 12th - tongue protrusion midline  MOTOR:  normal bulk and tone, full strength in the BUE, BLE, bilateral thenar and hypothenar atrophy   SENSORY:  normal and symmetric to light touch  COORDINATION:  finger-nose-finger, fine finger movements normal  REFLEXES:  deep tendon reflexes present and symmetric  GAIT/STATION:  Antalgic gait      DIAGNOSTIC DATA (LABS, IMAGING, TESTING) - I  reviewed patient records, labs,  notes, testing and imaging myself where available.  Lab Results  Component Value Date   WBC 4.7 11/26/2021   HGB 12.4 (L) 11/26/2021   HCT 38.5 (L) 11/26/2021   MCV 92.0 11/26/2021   PLT 123.0 (L) 11/26/2021      Component Value Date/Time   NA 130 (L) 11/26/2021 1204   NA 143 09/20/2020 1331   K 4.0 11/26/2021 1204   CL 87 (L) 11/26/2021 1204   CO2 28 11/26/2021 1204   GLUCOSE 129 (H) 11/26/2021 1204   GLUCOSE 81 07/06/2010 0000   BUN 51 (H) 11/26/2021 1204   BUN 36 (H) 09/20/2020 1331   CREATININE 7.15 (HH) 11/26/2021 1204   CREATININE 3.63 (H) 07/04/2017 1555   CALCIUM 9.4 11/26/2021 1204   PROT 8.3 11/26/2021 1204   ALBUMIN 4.3 11/26/2021 1204   AST 21 11/26/2021 1204   ALT 13 11/26/2021 1204   ALKPHOS 74 11/26/2021 1204   BILITOT 0.6 11/26/2021 1204   GFRNONAA 21 (L) 10/04/2021 1934   GFRAA 26 (L) 09/11/2019 1402   Lab Results  Component Value Date   CHOL 183 08/03/2021   HDL 48.40 08/03/2021   LDLCALC 91 02/26/2021   LDLDIRECT 94.0 08/03/2021   TRIG 306.0 (H) 08/03/2021   CHOLHDL 4 08/03/2021   Lab Results  Component Value Date   HGBA1C 5.7 08/03/2021   No results found for: "VITAMINB12" Lab Results  Component Value Date   TSH 4.64 11/26/2021      ASSESSMENT AND PLAN  81 y.o. year old male with likely dementia, hypothyroidism, hyperlipidemia and depression who is presenting with worsening memory and reported hallucinations but per caregiver patient does report seeing flies in his peripheral vision field.  Caregiver thinks these might be related to seeing floaters. He does have a diagnosis of cataract and is pending surgery scheduled for next month.  On top of that, he does also hear and see his deceased wife.  But these do not cause any behavioral disturbance.  At this time due to worsening memory, I will add Namenda 10 mg twice daily.  We will continue to monitor these hallucinations but I did inform caregiver that if these hallucinations get worse or  cause behavioral disturbance, to let me know and at that time we will try patient on low-dose Seroquel.  Continue to follow-up with the PCP and return in 1 year or sooner if worse.   1. 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  Exercise Daily Walk A daily 20 minute walk should be part of your routine. Disease related apathy can be a significant roadblock to exercise and the only way to overcome this is to make it a daily routine and perhaps have a reward at the end (something your loved one loves to eat or drink perhaps) or a personal trainer coming to the home can also be very useful. Most importantly, the patient is much more likely to exercise if the caregiver / spouse does it with him/her. In general a structured, repetitive  schedule is best.  General Health: Any diseases which effect your body will effect your brain such as a pneumonia, urinary infection, blood clot, heart attack or stroke. Keep contact with your primary care doctor for regular follow ups.  Sleep. A good nights sleep is healthy for the brain. Seven hours is recommended. If you have insomnia or poor sleep habits we can give you some instructions. If you have sleep apnea wear your mask.  Diet: Eating a heart healthy diet is also a good idea; fish and poultry instead of red meat, nuts (mostly non-peanuts), vegetables, fruits, olive oil or canola oil (instead of butter), minimal salt (use other spices to flavor foods), whole grain rice, bread, cereal and pasta and wine in  moderation.Research is now showing that the MIND diet, which is a combination of The Mediterranean diet and the DASH diet, is beneficial for cognitive processing and longevity. Information about this diet can be found in The MIND Diet, a book by Doyne Keel, MS, RDN, and online at [employer]bookDistributors.si  Finances, Power of Attorney and Advance Directives: You should consider putting legal safeguards in place with regard to financial and medical decision making. While the spouse always has power of attorney for medical and financial issues in the absence of any form, you should consider what you want in case the spouse / caregiver is no longer around or capable of making decisions.     No orders of the defined types were placed in this encounter.   Meds ordered this encounter  Medications   memantine (NAMENDA) 10 MG tablet    Sig: Take 1 tablet (10 mg total) by mouth 2 (two) times daily.    Dispense:  60 tablet    Refill:  11    Return in about 1 year (around 12/27/2022).  I have spent a total of 62 minutes dedicated to this patient today, preparing to see patient, performing a medically appropriate examination and evaluation, ordering tests and/or medications and procedures, and counseling and educating the patient/family/caregiver; independently interpreting result and communicating results to the family/patient/caregiver; and documenting clinical information in the electronic medical record.   Alric Ran, MD 12/26/2021, 4:09 PM  [facility] [address], [place], [place] [address] [phone]

## 2021-12-27 DIAGNOSIS — Z992 Dependence on renal dialysis: Secondary | ICD-10-CM | POA: Diagnosis not present

## 2021-12-27 DIAGNOSIS — E1122 Type 2 diabetes mellitus with diabetic chronic kidney disease: Secondary | ICD-10-CM | POA: Diagnosis not present

## 2021-12-27 DIAGNOSIS — N186 End stage renal disease: Secondary | ICD-10-CM | POA: Diagnosis not present

## 2021-12-27 DIAGNOSIS — D631 Anemia in chronic kidney disease: Secondary | ICD-10-CM | POA: Diagnosis not present

## 2021-12-27 DIAGNOSIS — N2581 Secondary hyperparathyroidism of renal origin: Secondary | ICD-10-CM | POA: Diagnosis not present

## 2021-12-27 DIAGNOSIS — D689 Coagulation defect, unspecified: Secondary | ICD-10-CM | POA: Diagnosis not present

## 2021-12-29 DIAGNOSIS — D631 Anemia in chronic kidney disease: Secondary | ICD-10-CM | POA: Diagnosis not present

## 2021-12-29 DIAGNOSIS — N2581 Secondary hyperparathyroidism of renal origin: Secondary | ICD-10-CM | POA: Diagnosis not present

## 2021-12-29 DIAGNOSIS — E1122 Type 2 diabetes mellitus with diabetic chronic kidney disease: Secondary | ICD-10-CM | POA: Diagnosis not present

## 2021-12-29 DIAGNOSIS — D689 Coagulation defect, unspecified: Secondary | ICD-10-CM | POA: Diagnosis not present

## 2021-12-29 DIAGNOSIS — N186 End stage renal disease: Secondary | ICD-10-CM | POA: Diagnosis not present

## 2021-12-29 DIAGNOSIS — Z992 Dependence on renal dialysis: Secondary | ICD-10-CM | POA: Diagnosis not present

## 2022-01-01 DIAGNOSIS — D689 Coagulation defect, unspecified: Secondary | ICD-10-CM | POA: Diagnosis not present

## 2022-01-01 DIAGNOSIS — N186 End stage renal disease: Secondary | ICD-10-CM | POA: Diagnosis not present

## 2022-01-01 DIAGNOSIS — E876 Hypokalemia: Secondary | ICD-10-CM | POA: Diagnosis not present

## 2022-01-01 DIAGNOSIS — Z992 Dependence on renal dialysis: Secondary | ICD-10-CM | POA: Diagnosis not present

## 2022-01-01 DIAGNOSIS — N2581 Secondary hyperparathyroidism of renal origin: Secondary | ICD-10-CM | POA: Diagnosis not present

## 2022-01-03 DIAGNOSIS — Z992 Dependence on renal dialysis: Secondary | ICD-10-CM | POA: Diagnosis not present

## 2022-01-03 DIAGNOSIS — D689 Coagulation defect, unspecified: Secondary | ICD-10-CM | POA: Diagnosis not present

## 2022-01-03 DIAGNOSIS — N186 End stage renal disease: Secondary | ICD-10-CM | POA: Diagnosis not present

## 2022-01-03 DIAGNOSIS — N2581 Secondary hyperparathyroidism of renal origin: Secondary | ICD-10-CM | POA: Diagnosis not present

## 2022-01-03 DIAGNOSIS — E876 Hypokalemia: Secondary | ICD-10-CM | POA: Diagnosis not present

## 2022-01-04 DIAGNOSIS — H25811 Combined forms of age-related cataract, right eye: Secondary | ICD-10-CM | POA: Diagnosis not present

## 2022-01-05 DIAGNOSIS — E876 Hypokalemia: Secondary | ICD-10-CM | POA: Diagnosis not present

## 2022-01-05 DIAGNOSIS — Z992 Dependence on renal dialysis: Secondary | ICD-10-CM | POA: Diagnosis not present

## 2022-01-05 DIAGNOSIS — D689 Coagulation defect, unspecified: Secondary | ICD-10-CM | POA: Diagnosis not present

## 2022-01-05 DIAGNOSIS — N2581 Secondary hyperparathyroidism of renal origin: Secondary | ICD-10-CM | POA: Diagnosis not present

## 2022-01-05 DIAGNOSIS — N186 End stage renal disease: Secondary | ICD-10-CM | POA: Diagnosis not present

## 2022-01-08 DIAGNOSIS — N2581 Secondary hyperparathyroidism of renal origin: Secondary | ICD-10-CM | POA: Diagnosis not present

## 2022-01-08 DIAGNOSIS — Z992 Dependence on renal dialysis: Secondary | ICD-10-CM | POA: Diagnosis not present

## 2022-01-08 DIAGNOSIS — D631 Anemia in chronic kidney disease: Secondary | ICD-10-CM | POA: Diagnosis not present

## 2022-01-08 DIAGNOSIS — E876 Hypokalemia: Secondary | ICD-10-CM | POA: Diagnosis not present

## 2022-01-08 DIAGNOSIS — N186 End stage renal disease: Secondary | ICD-10-CM | POA: Diagnosis not present

## 2022-01-08 DIAGNOSIS — D689 Coagulation defect, unspecified: Secondary | ICD-10-CM | POA: Diagnosis not present

## 2022-01-10 DIAGNOSIS — D631 Anemia in chronic kidney disease: Secondary | ICD-10-CM | POA: Diagnosis not present

## 2022-01-10 DIAGNOSIS — N186 End stage renal disease: Secondary | ICD-10-CM | POA: Diagnosis not present

## 2022-01-10 DIAGNOSIS — E876 Hypokalemia: Secondary | ICD-10-CM | POA: Diagnosis not present

## 2022-01-10 DIAGNOSIS — N2581 Secondary hyperparathyroidism of renal origin: Secondary | ICD-10-CM | POA: Diagnosis not present

## 2022-01-10 DIAGNOSIS — D689 Coagulation defect, unspecified: Secondary | ICD-10-CM | POA: Diagnosis not present

## 2022-01-10 DIAGNOSIS — Z992 Dependence on renal dialysis: Secondary | ICD-10-CM | POA: Diagnosis not present

## 2022-01-12 DIAGNOSIS — N2581 Secondary hyperparathyroidism of renal origin: Secondary | ICD-10-CM | POA: Diagnosis not present

## 2022-01-12 DIAGNOSIS — D689 Coagulation defect, unspecified: Secondary | ICD-10-CM | POA: Diagnosis not present

## 2022-01-12 DIAGNOSIS — Z992 Dependence on renal dialysis: Secondary | ICD-10-CM | POA: Diagnosis not present

## 2022-01-12 DIAGNOSIS — E876 Hypokalemia: Secondary | ICD-10-CM | POA: Diagnosis not present

## 2022-01-12 DIAGNOSIS — N186 End stage renal disease: Secondary | ICD-10-CM | POA: Diagnosis not present

## 2022-01-12 DIAGNOSIS — D631 Anemia in chronic kidney disease: Secondary | ICD-10-CM | POA: Diagnosis not present

## 2022-01-14 DIAGNOSIS — Z992 Dependence on renal dialysis: Secondary | ICD-10-CM | POA: Diagnosis not present

## 2022-01-14 DIAGNOSIS — I129 Hypertensive chronic kidney disease with stage 1 through stage 4 chronic kidney disease, or unspecified chronic kidney disease: Secondary | ICD-10-CM | POA: Diagnosis not present

## 2022-01-14 DIAGNOSIS — N186 End stage renal disease: Secondary | ICD-10-CM | POA: Diagnosis not present

## 2022-01-15 DIAGNOSIS — E876 Hypokalemia: Secondary | ICD-10-CM | POA: Diagnosis not present

## 2022-01-15 DIAGNOSIS — Z992 Dependence on renal dialysis: Secondary | ICD-10-CM | POA: Diagnosis not present

## 2022-01-15 DIAGNOSIS — D689 Coagulation defect, unspecified: Secondary | ICD-10-CM | POA: Diagnosis not present

## 2022-01-15 DIAGNOSIS — N186 End stage renal disease: Secondary | ICD-10-CM | POA: Diagnosis not present

## 2022-01-15 DIAGNOSIS — N2581 Secondary hyperparathyroidism of renal origin: Secondary | ICD-10-CM | POA: Diagnosis not present

## 2022-01-17 DIAGNOSIS — Z992 Dependence on renal dialysis: Secondary | ICD-10-CM | POA: Diagnosis not present

## 2022-01-17 DIAGNOSIS — N2581 Secondary hyperparathyroidism of renal origin: Secondary | ICD-10-CM | POA: Diagnosis not present

## 2022-01-17 DIAGNOSIS — E876 Hypokalemia: Secondary | ICD-10-CM | POA: Diagnosis not present

## 2022-01-17 DIAGNOSIS — N186 End stage renal disease: Secondary | ICD-10-CM | POA: Diagnosis not present

## 2022-01-17 DIAGNOSIS — D689 Coagulation defect, unspecified: Secondary | ICD-10-CM | POA: Diagnosis not present

## 2022-01-19 DIAGNOSIS — D689 Coagulation defect, unspecified: Secondary | ICD-10-CM | POA: Diagnosis not present

## 2022-01-19 DIAGNOSIS — Z992 Dependence on renal dialysis: Secondary | ICD-10-CM | POA: Diagnosis not present

## 2022-01-19 DIAGNOSIS — N2581 Secondary hyperparathyroidism of renal origin: Secondary | ICD-10-CM | POA: Diagnosis not present

## 2022-01-19 DIAGNOSIS — E876 Hypokalemia: Secondary | ICD-10-CM | POA: Diagnosis not present

## 2022-01-19 DIAGNOSIS — N186 End stage renal disease: Secondary | ICD-10-CM | POA: Diagnosis not present

## 2022-01-22 DIAGNOSIS — D689 Coagulation defect, unspecified: Secondary | ICD-10-CM | POA: Diagnosis not present

## 2022-01-22 DIAGNOSIS — D631 Anemia in chronic kidney disease: Secondary | ICD-10-CM | POA: Diagnosis not present

## 2022-01-22 DIAGNOSIS — Z992 Dependence on renal dialysis: Secondary | ICD-10-CM | POA: Diagnosis not present

## 2022-01-22 DIAGNOSIS — N2581 Secondary hyperparathyroidism of renal origin: Secondary | ICD-10-CM | POA: Diagnosis not present

## 2022-01-22 DIAGNOSIS — N186 End stage renal disease: Secondary | ICD-10-CM | POA: Diagnosis not present

## 2022-01-24 DIAGNOSIS — N2581 Secondary hyperparathyroidism of renal origin: Secondary | ICD-10-CM | POA: Diagnosis not present

## 2022-01-24 DIAGNOSIS — D689 Coagulation defect, unspecified: Secondary | ICD-10-CM | POA: Diagnosis not present

## 2022-01-24 DIAGNOSIS — D631 Anemia in chronic kidney disease: Secondary | ICD-10-CM | POA: Diagnosis not present

## 2022-01-24 DIAGNOSIS — Z992 Dependence on renal dialysis: Secondary | ICD-10-CM | POA: Diagnosis not present

## 2022-01-24 DIAGNOSIS — N186 End stage renal disease: Secondary | ICD-10-CM | POA: Diagnosis not present

## 2022-01-25 ENCOUNTER — Other Ambulatory Visit: Payer: Self-pay | Admitting: Family Medicine

## 2022-01-26 DIAGNOSIS — N2581 Secondary hyperparathyroidism of renal origin: Secondary | ICD-10-CM | POA: Diagnosis not present

## 2022-01-26 DIAGNOSIS — N186 End stage renal disease: Secondary | ICD-10-CM | POA: Diagnosis not present

## 2022-01-26 DIAGNOSIS — D631 Anemia in chronic kidney disease: Secondary | ICD-10-CM | POA: Diagnosis not present

## 2022-01-26 DIAGNOSIS — Z992 Dependence on renal dialysis: Secondary | ICD-10-CM | POA: Diagnosis not present

## 2022-01-26 DIAGNOSIS — D689 Coagulation defect, unspecified: Secondary | ICD-10-CM | POA: Diagnosis not present

## 2022-01-29 DIAGNOSIS — E876 Hypokalemia: Secondary | ICD-10-CM | POA: Diagnosis not present

## 2022-01-29 DIAGNOSIS — D689 Coagulation defect, unspecified: Secondary | ICD-10-CM | POA: Diagnosis not present

## 2022-01-29 DIAGNOSIS — N2581 Secondary hyperparathyroidism of renal origin: Secondary | ICD-10-CM | POA: Diagnosis not present

## 2022-01-29 DIAGNOSIS — N186 End stage renal disease: Secondary | ICD-10-CM | POA: Diagnosis not present

## 2022-01-29 DIAGNOSIS — Z992 Dependence on renal dialysis: Secondary | ICD-10-CM | POA: Diagnosis not present

## 2022-01-30 DIAGNOSIS — Z87891 Personal history of nicotine dependence: Secondary | ICD-10-CM | POA: Diagnosis not present

## 2022-01-30 DIAGNOSIS — N186 End stage renal disease: Secondary | ICD-10-CM | POA: Diagnosis not present

## 2022-01-30 DIAGNOSIS — E785 Hyperlipidemia, unspecified: Secondary | ICD-10-CM | POA: Diagnosis not present

## 2022-01-30 DIAGNOSIS — R69 Illness, unspecified: Secondary | ICD-10-CM | POA: Diagnosis not present

## 2022-01-30 DIAGNOSIS — Z992 Dependence on renal dialysis: Secondary | ICD-10-CM | POA: Diagnosis not present

## 2022-01-30 DIAGNOSIS — K219 Gastro-esophageal reflux disease without esophagitis: Secondary | ICD-10-CM | POA: Diagnosis not present

## 2022-01-30 DIAGNOSIS — E039 Hypothyroidism, unspecified: Secondary | ICD-10-CM | POA: Diagnosis not present

## 2022-01-30 DIAGNOSIS — R030 Elevated blood-pressure reading, without diagnosis of hypertension: Secondary | ICD-10-CM | POA: Diagnosis not present

## 2022-01-30 DIAGNOSIS — J309 Allergic rhinitis, unspecified: Secondary | ICD-10-CM | POA: Diagnosis not present

## 2022-01-30 DIAGNOSIS — M109 Gout, unspecified: Secondary | ICD-10-CM | POA: Diagnosis not present

## 2022-01-30 DIAGNOSIS — E1122 Type 2 diabetes mellitus with diabetic chronic kidney disease: Secondary | ICD-10-CM | POA: Diagnosis not present

## 2022-01-30 DIAGNOSIS — H109 Unspecified conjunctivitis: Secondary | ICD-10-CM | POA: Diagnosis not present

## 2022-01-31 DIAGNOSIS — E876 Hypokalemia: Secondary | ICD-10-CM | POA: Diagnosis not present

## 2022-01-31 DIAGNOSIS — D689 Coagulation defect, unspecified: Secondary | ICD-10-CM | POA: Diagnosis not present

## 2022-01-31 DIAGNOSIS — N2581 Secondary hyperparathyroidism of renal origin: Secondary | ICD-10-CM | POA: Diagnosis not present

## 2022-01-31 DIAGNOSIS — Z992 Dependence on renal dialysis: Secondary | ICD-10-CM | POA: Diagnosis not present

## 2022-01-31 DIAGNOSIS — N186 End stage renal disease: Secondary | ICD-10-CM | POA: Diagnosis not present

## 2022-02-02 DIAGNOSIS — E876 Hypokalemia: Secondary | ICD-10-CM | POA: Diagnosis not present

## 2022-02-02 DIAGNOSIS — N2581 Secondary hyperparathyroidism of renal origin: Secondary | ICD-10-CM | POA: Diagnosis not present

## 2022-02-02 DIAGNOSIS — N186 End stage renal disease: Secondary | ICD-10-CM | POA: Diagnosis not present

## 2022-02-02 DIAGNOSIS — D689 Coagulation defect, unspecified: Secondary | ICD-10-CM | POA: Diagnosis not present

## 2022-02-02 DIAGNOSIS — Z992 Dependence on renal dialysis: Secondary | ICD-10-CM | POA: Diagnosis not present

## 2022-02-05 DIAGNOSIS — Z992 Dependence on renal dialysis: Secondary | ICD-10-CM | POA: Diagnosis not present

## 2022-02-05 DIAGNOSIS — N186 End stage renal disease: Secondary | ICD-10-CM | POA: Diagnosis not present

## 2022-02-05 DIAGNOSIS — D631 Anemia in chronic kidney disease: Secondary | ICD-10-CM | POA: Diagnosis not present

## 2022-02-05 DIAGNOSIS — D689 Coagulation defect, unspecified: Secondary | ICD-10-CM | POA: Diagnosis not present

## 2022-02-05 DIAGNOSIS — N2581 Secondary hyperparathyroidism of renal origin: Secondary | ICD-10-CM | POA: Diagnosis not present

## 2022-02-05 DIAGNOSIS — E876 Hypokalemia: Secondary | ICD-10-CM | POA: Diagnosis not present

## 2022-02-07 DIAGNOSIS — Z992 Dependence on renal dialysis: Secondary | ICD-10-CM | POA: Diagnosis not present

## 2022-02-07 DIAGNOSIS — N2581 Secondary hyperparathyroidism of renal origin: Secondary | ICD-10-CM | POA: Diagnosis not present

## 2022-02-07 DIAGNOSIS — D689 Coagulation defect, unspecified: Secondary | ICD-10-CM | POA: Diagnosis not present

## 2022-02-07 DIAGNOSIS — N186 End stage renal disease: Secondary | ICD-10-CM | POA: Diagnosis not present

## 2022-02-07 DIAGNOSIS — E876 Hypokalemia: Secondary | ICD-10-CM | POA: Diagnosis not present

## 2022-02-07 DIAGNOSIS — D631 Anemia in chronic kidney disease: Secondary | ICD-10-CM | POA: Diagnosis not present

## 2022-02-09 DIAGNOSIS — D689 Coagulation defect, unspecified: Secondary | ICD-10-CM | POA: Diagnosis not present

## 2022-02-09 DIAGNOSIS — E876 Hypokalemia: Secondary | ICD-10-CM | POA: Diagnosis not present

## 2022-02-09 DIAGNOSIS — N186 End stage renal disease: Secondary | ICD-10-CM | POA: Diagnosis not present

## 2022-02-09 DIAGNOSIS — N2581 Secondary hyperparathyroidism of renal origin: Secondary | ICD-10-CM | POA: Diagnosis not present

## 2022-02-09 DIAGNOSIS — Z992 Dependence on renal dialysis: Secondary | ICD-10-CM | POA: Diagnosis not present

## 2022-02-09 DIAGNOSIS — D631 Anemia in chronic kidney disease: Secondary | ICD-10-CM | POA: Diagnosis not present

## 2022-02-12 DIAGNOSIS — D689 Coagulation defect, unspecified: Secondary | ICD-10-CM | POA: Diagnosis not present

## 2022-02-12 DIAGNOSIS — Z992 Dependence on renal dialysis: Secondary | ICD-10-CM | POA: Diagnosis not present

## 2022-02-12 DIAGNOSIS — N186 End stage renal disease: Secondary | ICD-10-CM | POA: Diagnosis not present

## 2022-02-12 DIAGNOSIS — N2581 Secondary hyperparathyroidism of renal origin: Secondary | ICD-10-CM | POA: Diagnosis not present

## 2022-02-13 NOTE — Progress Notes (Deleted)
Electrophysiology Office Note Date: 02/13/2022  ID:  Tyler Hahn, Tyler Hahn 1940-10-23, MRN [id number]  PCP: Tyler Minium, MD Primary Cardiologist: None Electrophysiologist: Tyler Axe, MD ***  CC: Routine ICD follow-up  Tyler Hahn is a 81 y.o. male seen today for Tyler Axe, MD for {Blank single:19197::"cardiac clearance","post hospital follow up","acute visit due to ***","routine electrophysiology followup"}.  Since {Blank single:19197::"last being seen in our clinic","discharge from hospital"} the patient reports doing ***.  he denies chest pain, palpitations, dyspnea, PND, orthopnea, nausea, vomiting, dizziness, syncope, edema, weight gain, or early satiety. {He/she (caps):30048} has not had ICD shocks.   Device History: BSCi dual chamber ICD, implanted  06/03/08, gen cahnge 10/2020 + appropriate  Therapies March 2021 in environment of marked hypokalemia  Past Medical History:  Diagnosis Date   Anxiety    Automatic implantable cardioverter-defibrillator in situ    Tyler Hahn   CHF (congestive heart failure) ([place])    2000   Diabetes mellitus    no meds   Fatty liver    Gout    "bout 2-3 months ago"-meds helped.   Hyperlipidemia    Hypertension    Hyperthyroidism    Kidney cysts    PONV (postoperative nausea and vomiting)    Renal insufficiency    Past Surgical History:  Procedure Laterality Date   AV FISTULA PLACEMENT Right 08/23/2019   Procedure: ARTERIOVENOUS GORTEX GRAFT RIGHT ARM;  Surgeon: Tyler Posner, MD;  [facility];  Service: Vascular;  Laterality: Right;   CARDIAC DEFIBRILLATOR PLACEMENT     COLONOSCOPY WITH PROPOFOL N/A 10/03/2015   Procedure: COLONOSCOPY WITH PROPOFOL;  Surgeon: Tyler Artist, MD;  [facility];  Service: Endoscopy;  Laterality: N/A;   DIAGNOSTIC LAPAROSCOPY  01/27/2014   Dr Tyler Hahn   ENTEROSCOPY N/A 11/25/2013   Procedure: ENTEROSCOPY;  Surgeon: Tyler Artist, MD;  [facility];  Service:  Endoscopy;  Laterality: N/A;   ICD GENERATOR CHANGEOUT N/A 11/06/2020   Procedure: ICD GENERATOR CHANGEOUT;  Surgeon: Tyler Sprang, MD;  Location: [facility];  Service: Cardiovascular;  Laterality: N/A;   ICD,[employer]     LAPAROSCOPY N/A 01/27/2014   Procedure: LAPAROSCOPY DIAGNOSTIC;  Surgeon: Tyler Faster. Cornett, MD;  [facility];  Service: General;  Laterality: N/A;   polyp removal throat     difficulty speaking    Current Outpatient Medications  Medication Sig Dispense Refill   allopurinol (ZYLOPRIM) 100 MG tablet TAKE 1 TABLET BY MOUTH 3 TIMES A WEEK ON MONDAY, WEDNESDAY AND FRIDAY 12 tablet 2   atorvastatin (LIPITOR) 20 MG tablet TAKE 1 TABLET BY MOUTH EVERY DAY 30 tablet 5   bacitracin ointment Apply 1 application. topically 2 (two) times daily. 120 g 0   donepezil (ARICEPT) 10 MG tablet Take 1 tablet (10 mg total) by mouth at bedtime. 90 tablet 3   hydrocortisone cream 1 % Apply to affected area 2 times daily 15 g 0   levothyroxine (SYNTHROID) 25 MCG tablet TAKE 1 TABLET BY MOUTH EVERY DAY BEFORE BREAKFAST 30 tablet 5   loratadine (CLARITIN) 10 MG tablet Take 1 tablet (10 mg total) by mouth daily. 30 tablet 0   memantine (NAMENDA) 10 MG tablet Take 1 tablet (10 mg total) by mouth 2 (two) times daily. 60 tablet 11   Multiple Vitamins-Minerals (MULTIVITAMIN WITH MINERALS) tablet Take 1 tablet by mouth daily.     omeprazole (PRILOSEC) 40 MG capsule Take 1 capsule (40 mg total) by mouth  daily. 30 capsule 3   OVER THE COUNTER MEDICATION Take 1 capsule by mouth daily. Brain Food     sertraline (ZOLOFT) 100 MG tablet TAKE 1 TABLET BY MOUTH EVERY DAY 30 tablet 2   trimethoprim-polymyxin b (POLYTRIM) ophthalmic solution Place 1 drop into both eyes 2 (two) times daily. 10 mL 0   No current facility-administered medications for this visit.    Allergies:   Sulfa antibiotics and Sulfonamide derivatives   Social History: Social History   Socioeconomic History   Marital  status: Widowed    Spouse name: Not on file   Number of children: 4   Years of education: Not on file   Highest education level: Not on file  Occupational History   Occupation: Retired    Fish farm manager: RETIRED  Tobacco Use   Smoking status: Former    Types: Cigarettes    Quit date: 11/26/1998    Years since quitting: 23.2   Smokeless tobacco: Never  Vaping Use   Vaping Use: Never used  Substance and Sexual Activity   Alcohol use: No    Alcohol/week: 0.0 standard drinks of alcohol   Drug use: No   Sexual activity: Not on file  Other Topics Concern   Not on file  Social History Narrative   Not on file   Social Determinants of Health   Financial Resource Strain: Low Risk  (12/26/2021)   Overall Financial Resource Strain (CARDIA)    Difficulty of Paying Living Expenses: Not hard at all  Food Insecurity: No Food Insecurity (12/26/2021)   Hunger Vital Sign    Worried About Running Out of Food in the Last Year: Never true    [place] in the Last Year: Never true  Transportation Needs: No Transportation Needs (12/26/2021)   PRAPARE - Hydrologist (Medical): No    Lack of Transportation (Non-Medical): No  Physical Activity: Insufficiently Active (12/26/2021)   Exercise Vital Sign    Days of Exercise per Week: 3 days    Minutes of Exercise per Session: 30 min  Stress: No Stress Concern Present (12/26/2021)   [place]    Feeling of Stress : Not at all  Social Connections: Moderately Integrated (12/26/2021)   Social Connection and Isolation Panel [NHANES]    Frequency of Communication with Friends and Family: Three times a week    Frequency of Social Gatherings with Friends and Family: Three times a week    Attends Religious Services: 1 to 4 times per year    Active Member of Clubs or Organizations: Yes    Attends Archivist Meetings: 1 to 4 times per year    Marital Status:  Widowed  Intimate Partner Violence: Not At Risk (12/26/2021)   Humiliation, Afraid, Rape, and Kick questionnaire    Fear of Current or Ex-Partner: No    Emotionally Abused: No    Physically Abused: No    Sexually Abused: No    Family History: Family History  Problem Relation Age of Onset   Stomach cancer Mother    Alzheimer's disease Mother    Diabetes Father    Heart disease Father    Liver disease Father    Kidney disease Father     Review of Systems: All other systems reviewed and are otherwise negative except as noted above.   Physical Exam: There were no vitals filed for this visit.   GEN- The patient is well appearing,  alert and oriented x 3 today.   HEENT: normocephalic, atraumatic; sclera clear, conjunctiva pink; hearing intact; oropharynx clear; neck supple, no JVP Lymph- no cervical lymphadenopathy Lungs- Clear to ausculation bilaterally, normal work of breathing.  No wheezes, rales, rhonchi Heart- Regular rate and rhythm, no murmurs, rubs or gallops, PMI not laterally displaced GI- soft, non-tender, non-distended, bowel sounds present, no hepatosplenomegaly Extremities- no clubbing or cyanosis. No edema; DP/PT/radial pulses 2+ bilaterally MS- no significant deformity or atrophy Skin- warm and dry, no rash or lesion; ICD pocket well healed Psych- euthymic mood, full affect Neuro- strength and sensation are intact  ICD interrogation- reviewed in detail today,  See PACEART report  EKG:  EKG is not ordered today. Personal review of EKG ordered  10/04/2021  shows ***  Recent Labs: 11/26/2021: ALT 13; BUN 51; Creatinine, Ser 7.15; Hemoglobin 12.4; Platelets 123.0; Potassium 4.0; Sodium 130; TSH 4.64   Wt Readings from Last 3 Encounters:  12/26/21 173 lb (78.5 kg)  11/26/21 169 lb 3 oz (76.7 kg)  10/30/21 160 lb (72.6 kg)     Other studies Reviewed: Additional studies/ records that were reviewed today include: Previous EP office notes.   Assessment and  Plan:  1.  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 HTN Stable on current regimen   4. H/o VT/VF Labs today.  Quescent  Current medicines are reviewed at length with the patient today.   =  Labs/ tests ordered today include: *** No orders of the defined types were placed in this encounter.    Disposition:   Follow up with {Blank single:19197::"Dr. Allred","Dr. Arlan Organ. Klein","Dr. Camnitz","Dr. Lambert","EP APP"} in {Blank single:19197::"2 weeks","4 weeks","3 months","6 months","12 months","as usual post gen change"}    Signed, Annamaria Helling  02/13/2022 9:27 PM  [place] [address] [phone] (office) [phone] (fax)

## 2022-02-14 DIAGNOSIS — Z992 Dependence on renal dialysis: Secondary | ICD-10-CM | POA: Diagnosis not present

## 2022-02-14 DIAGNOSIS — D689 Coagulation defect, unspecified: Secondary | ICD-10-CM | POA: Diagnosis not present

## 2022-02-14 DIAGNOSIS — N186 End stage renal disease: Secondary | ICD-10-CM | POA: Diagnosis not present

## 2022-02-14 DIAGNOSIS — N2581 Secondary hyperparathyroidism of renal origin: Secondary | ICD-10-CM | POA: Diagnosis not present

## 2022-02-14 DIAGNOSIS — I129 Hypertensive chronic kidney disease with stage 1 through stage 4 chronic kidney disease, or unspecified chronic kidney disease: Secondary | ICD-10-CM | POA: Diagnosis not present

## 2022-02-15 ENCOUNTER — Encounter: Payer: Medicare HMO | Admitting: Student

## 2022-02-15 DIAGNOSIS — I472 Ventricular tachycardia, unspecified: Secondary | ICD-10-CM

## 2022-02-15 DIAGNOSIS — I5022 Chronic systolic (congestive) heart failure: Secondary | ICD-10-CM

## 2022-02-15 DIAGNOSIS — I428 Other cardiomyopathies: Secondary | ICD-10-CM

## 2022-02-15 DIAGNOSIS — I10 Essential (primary) hypertension: Secondary | ICD-10-CM

## 2022-02-16 DIAGNOSIS — N186 End stage renal disease: Secondary | ICD-10-CM | POA: Diagnosis not present

## 2022-02-16 DIAGNOSIS — N2581 Secondary hyperparathyroidism of renal origin: Secondary | ICD-10-CM | POA: Diagnosis not present

## 2022-02-16 DIAGNOSIS — D689 Coagulation defect, unspecified: Secondary | ICD-10-CM | POA: Diagnosis not present

## 2022-02-16 DIAGNOSIS — Z992 Dependence on renal dialysis: Secondary | ICD-10-CM | POA: Diagnosis not present

## 2022-02-19 DIAGNOSIS — Z992 Dependence on renal dialysis: Secondary | ICD-10-CM | POA: Diagnosis not present

## 2022-02-19 DIAGNOSIS — D689 Coagulation defect, unspecified: Secondary | ICD-10-CM | POA: Diagnosis not present

## 2022-02-19 DIAGNOSIS — N2581 Secondary hyperparathyroidism of renal origin: Secondary | ICD-10-CM | POA: Diagnosis not present

## 2022-02-19 DIAGNOSIS — N186 End stage renal disease: Secondary | ICD-10-CM | POA: Diagnosis not present

## 2022-02-20 ENCOUNTER — Other Ambulatory Visit: Payer: Self-pay | Admitting: Family Medicine

## 2022-02-21 DIAGNOSIS — N2581 Secondary hyperparathyroidism of renal origin: Secondary | ICD-10-CM | POA: Diagnosis not present

## 2022-02-21 DIAGNOSIS — N186 End stage renal disease: Secondary | ICD-10-CM | POA: Diagnosis not present

## 2022-02-21 DIAGNOSIS — Z992 Dependence on renal dialysis: Secondary | ICD-10-CM | POA: Diagnosis not present

## 2022-02-21 DIAGNOSIS — D689 Coagulation defect, unspecified: Secondary | ICD-10-CM | POA: Diagnosis not present

## 2022-02-22 DIAGNOSIS — H25812 Combined forms of age-related cataract, left eye: Secondary | ICD-10-CM | POA: Diagnosis not present

## 2022-02-23 DIAGNOSIS — N2581 Secondary hyperparathyroidism of renal origin: Secondary | ICD-10-CM | POA: Diagnosis not present

## 2022-02-23 DIAGNOSIS — Z992 Dependence on renal dialysis: Secondary | ICD-10-CM | POA: Diagnosis not present

## 2022-02-23 DIAGNOSIS — D689 Coagulation defect, unspecified: Secondary | ICD-10-CM | POA: Diagnosis not present

## 2022-02-23 DIAGNOSIS — N186 End stage renal disease: Secondary | ICD-10-CM | POA: Diagnosis not present

## 2022-02-25 ENCOUNTER — Other Ambulatory Visit: Payer: Self-pay

## 2022-02-25 ENCOUNTER — Ambulatory Visit (INDEPENDENT_AMBULATORY_CARE_PROVIDER_SITE_OTHER): Payer: Medicare HMO | Admitting: Family Medicine

## 2022-02-25 ENCOUNTER — Encounter: Payer: Self-pay | Admitting: Family Medicine

## 2022-02-25 VITALS — BP 120/60 | HR 66 | Temp 98.1°F | Resp 18 | Ht 67.0 in | Wt 180.2 lb

## 2022-02-25 DIAGNOSIS — E785 Hyperlipidemia, unspecified: Secondary | ICD-10-CM

## 2022-02-25 DIAGNOSIS — E039 Hypothyroidism, unspecified: Secondary | ICD-10-CM

## 2022-02-25 DIAGNOSIS — E1122 Type 2 diabetes mellitus with diabetic chronic kidney disease: Secondary | ICD-10-CM

## 2022-02-25 DIAGNOSIS — N186 End stage renal disease: Secondary | ICD-10-CM

## 2022-02-25 DIAGNOSIS — Z23 Encounter for immunization: Secondary | ICD-10-CM | POA: Diagnosis not present

## 2022-02-25 LAB — BASIC METABOLIC PANEL
BUN: 49 mg/dL — ABNORMAL HIGH (ref 6–23)
CO2: 30 mEq/L (ref 19–32)
Calcium: 9.3 mg/dL (ref 8.4–10.5)
Chloride: 95 mEq/L — ABNORMAL LOW (ref 96–112)
Creatinine, Ser: 7.73 mg/dL (ref 0.40–1.50)
GFR: 6.14 mL/min — CL (ref >60.00)
Glucose, Bld: 131 mg/dL — ABNORMAL HIGH (ref 70–99)
Potassium: 4.8 mEq/L (ref 3.5–5.1)
Sodium: 136 mEq/L (ref 135–145)

## 2022-02-25 LAB — LIPID PANEL
Cholesterol: 145 mg/dL (ref 0–200)
HDL: 39.2 mg/dL (ref >39.00)
LDL Cholesterol: 66 mg/dL (ref 0–99)
NonHDL: 105.98
Total CHOL/HDL Ratio: 4
Triglycerides: 200 mg/dL — ABNORMAL HIGH (ref 0.0–149.0)
VLDL: 40 mg/dL (ref 0.0–40.0)

## 2022-02-25 LAB — CBC WITH DIFFERENTIAL/PLATELET
Basophils Absolute: 0 10*3/uL (ref 0.0–0.1)
Basophils Relative: 1.2 % (ref 0.0–3.0)
Eosinophils Absolute: 0.2 10*3/uL (ref 0.0–0.7)
Eosinophils Relative: 4.3 % (ref 0.0–5.0)
HCT: 35.1 % — ABNORMAL LOW (ref 39.0–52.0)
Hemoglobin: 11.4 g/dL — ABNORMAL LOW (ref 13.0–17.0)
Lymphocytes Relative: 27.5 % (ref 12.0–46.0)
Lymphs Abs: 1.2 10*3/uL (ref 0.7–4.0)
MCHC: 32.5 g/dL (ref 30.0–36.0)
MCV: 91.3 fl (ref 78.0–100.0)
Monocytes Absolute: 0.5 10*3/uL (ref 0.1–1.0)
Monocytes Relative: 10.6 % (ref 3.0–12.0)
Neutro Abs: 2.4 10*3/uL (ref 1.4–7.7)
Neutrophils Relative %: 56.4 % (ref 43.0–77.0)
Platelets: 124 10*3/uL — ABNORMAL LOW (ref 150.0–400.0)
RBC: 3.85 Mil/uL — ABNORMAL LOW (ref 4.22–5.81)
RDW: 18.8 % — ABNORMAL HIGH (ref 11.5–15.5)
WBC: 4.3 10*3/uL (ref 4.0–10.5)

## 2022-02-25 LAB — TSH: TSH: 5.07 u[IU]/mL (ref 0.35–5.50)

## 2022-02-25 LAB — HEPATIC FUNCTION PANEL
ALT: 17 U/L (ref 0–53)
AST: 18 U/L (ref 0–37)
Albumin: 4.1 g/dL (ref 3.5–5.2)
Alkaline Phosphatase: 91 U/L (ref 39–117)
Bilirubin, Direct: 0.1 mg/dL (ref 0.0–0.3)
Total Bilirubin: 0.4 mg/dL (ref 0.2–1.2)
Total Protein: 8.1 g/dL (ref 6.0–8.3)

## 2022-02-25 LAB — HEMOGLOBIN A1C: Hgb A1c MFr Bld: 5.8 % (ref 4.6–6.5)

## 2022-02-25 MED ORDER — LORATADINE 10 MG PO TABS: 10.0000 mg | ORAL_TABLET | Freq: Every day | ORAL | 3 refills | Status: DC

## 2022-02-25 NOTE — Patient Instructions (Signed)
Follow up in 3-4 months to recheck sugar We'll notify you of your lab results and make any changes if needed No med changes at this time- you look great! Call with any questions or concerns Stay Safe!  Stay Healthy! Hang in there!!!

## 2022-02-25 NOTE — Progress Notes (Signed)
   Subjective:    Patient ID: Tyler Hahn, male    DOB: 09-01-1940, 81 y.o.   MRN: [id number]  HPI DM- chronic problem, pt has been able to control w/ diet.  Last A1C 5.7%.  Pt has chronic renal failure and goes to HD Tues/Thurs/Sat.  No SOB above baseline.  Occasional numbness/tingling of hands/feet.  Hyperlipidemia- chronic problem, on Lipitor '20mg'$  daily.  No CP, abd pain, N/V.  Hypothyroid- chronic problem, on Levothyroxine 20mg daily.  Some days w/ increased fatigue/need for sleep.     Review of Systems For ROS see HPI     Objective:   Physical Exam Vitals reviewed.  Constitutional:      General: He is not in acute distress.    Appearance: Normal appearance. He is well-developed. He is not ill-appearing.  HENT:     Head: Normocephalic and atraumatic.  Eyes:     Extraocular Movements: Extraocular movements intact.     Conjunctiva/sclera: Conjunctivae normal.     Pupils: Pupils are equal, round, and reactive to light.  Neck:     Thyroid: No thyromegaly.  Cardiovascular:     Rate and Rhythm: Normal rate and regular rhythm.     Pulses: Normal pulses.     Heart sounds: Normal heart sounds. No murmur heard. Pulmonary:     Effort: Pulmonary effort is normal. No respiratory distress.     Breath sounds: Normal breath sounds.  Abdominal:     General: Bowel sounds are normal. There is no distension.     Palpations: Abdomen is soft.  Musculoskeletal:     Cervical back: Normal range of motion and neck supple.     Right lower leg: Edema (trace) present.     Left lower leg: Edema (trace) present.  Lymphadenopathy:     Cervical: No cervical adenopathy.  Skin:    General: Skin is warm and dry.  Neurological:     General: No focal deficit present.     Mental Status: He is alert and oriented to person, place, and time.     Cranial Nerves: No cranial nerve deficit.  Psychiatric:        Mood and Affect: Mood normal.        Behavior: Behavior normal.            Assessment & Plan:

## 2022-02-25 NOTE — Assessment & Plan Note (Signed)
Chronic problem.  Currently has HD Tues/Thurs/Sat.  Doing well per pt and caregiver.

## 2022-02-25 NOTE — Assessment & Plan Note (Signed)
Chronic problem.  On Lipitor 20mg daily w/o difficulty.  Check labs.  Adjust meds prn  

## 2022-02-25 NOTE — Assessment & Plan Note (Signed)
Chronic problem.  Some days has increased fatigue but this is usually after spending the day or weekend w/ family and likely overdoing it.  Check labs.  Adjust meds prn

## 2022-02-25 NOTE — Addendum Note (Signed)
Addended by: Veneda Melter on: 02/25/2022 10:52 AM   Modules accepted: Orders

## 2022-02-25 NOTE — Assessment & Plan Note (Signed)
Chronic problem.  Has been diet controlled recently.  Last A1C 5.7%  Currently asymptomatic w/ exception of occasional numbness/tingling of hands/feet.  Due for eye exam- family to schedule.  Check labs.  Adjust tx plan prn.

## 2022-02-26 DIAGNOSIS — N2581 Secondary hyperparathyroidism of renal origin: Secondary | ICD-10-CM | POA: Diagnosis not present

## 2022-02-26 DIAGNOSIS — N186 End stage renal disease: Secondary | ICD-10-CM | POA: Diagnosis not present

## 2022-02-26 DIAGNOSIS — D509 Iron deficiency anemia, unspecified: Secondary | ICD-10-CM | POA: Diagnosis not present

## 2022-02-26 DIAGNOSIS — Z992 Dependence on renal dialysis: Secondary | ICD-10-CM | POA: Diagnosis not present

## 2022-02-26 DIAGNOSIS — D689 Coagulation defect, unspecified: Secondary | ICD-10-CM | POA: Diagnosis not present

## 2022-02-27 ENCOUNTER — Ambulatory Visit (INDEPENDENT_AMBULATORY_CARE_PROVIDER_SITE_OTHER): Payer: Medicare HMO

## 2022-02-27 DIAGNOSIS — I428 Other cardiomyopathies: Secondary | ICD-10-CM

## 2022-02-28 DIAGNOSIS — N186 End stage renal disease: Secondary | ICD-10-CM | POA: Diagnosis not present

## 2022-02-28 DIAGNOSIS — N2581 Secondary hyperparathyroidism of renal origin: Secondary | ICD-10-CM | POA: Diagnosis not present

## 2022-02-28 DIAGNOSIS — Z992 Dependence on renal dialysis: Secondary | ICD-10-CM | POA: Diagnosis not present

## 2022-02-28 DIAGNOSIS — D509 Iron deficiency anemia, unspecified: Secondary | ICD-10-CM | POA: Diagnosis not present

## 2022-02-28 DIAGNOSIS — D689 Coagulation defect, unspecified: Secondary | ICD-10-CM | POA: Diagnosis not present

## 2022-02-28 LAB — CUP PACEART REMOTE DEVICE CHECK
Battery Remaining Longevity: 150 mo
Battery Remaining Percentage: 100 %
Brady Statistic RA Percent Paced: 1 %
Brady Statistic RV Percent Paced: 4 %
Date Time Interrogation Session: 20230913050200
HighPow Impedance: 39 Ohm
Implantable Lead Implant Date: 20091218
Implantable Lead Implant Date: 20091218
Implantable Lead Location: 753859
Implantable Lead Location: 753860
Implantable Lead Model: 158
Implantable Lead Serial Number: 131093
Implantable Pulse Generator Implant Date: 20220523
Lead Channel Impedance Value: 363 Ohm
Lead Channel Impedance Value: 414 Ohm
Lead Channel Setting Pacing Amplitude: 2 V
Lead Channel Setting Pacing Amplitude: 2.4 V
Lead Channel Setting Pacing Pulse Width: 0.4 ms
Lead Channel Setting Sensing Sensitivity: 0.5 mV
Pulse Gen Serial Number: 232629

## 2022-03-01 ENCOUNTER — Ambulatory Visit: Payer: Medicare HMO | Attending: Student | Admitting: Student

## 2022-03-01 ENCOUNTER — Encounter: Payer: Self-pay | Admitting: Student

## 2022-03-01 VITALS — BP 122/64 | HR 83 | Ht 67.0 in | Wt 171.0 lb

## 2022-03-01 DIAGNOSIS — I428 Other cardiomyopathies: Secondary | ICD-10-CM

## 2022-03-01 DIAGNOSIS — I472 Ventricular tachycardia, unspecified: Secondary | ICD-10-CM

## 2022-03-01 DIAGNOSIS — N186 End stage renal disease: Secondary | ICD-10-CM

## 2022-03-01 LAB — CUP PACEART INCLINIC DEVICE CHECK
Date Time Interrogation Session: 20230915112147
HighPow Impedance: 39 Ohm
HighPow Impedance: 40 Ohm
Implantable Lead Implant Date: 20091218
Implantable Lead Implant Date: 20091218
Implantable Lead Location: 753859
Implantable Lead Location: 753860
Implantable Lead Model: 158
Implantable Lead Serial Number: 131093
Implantable Pulse Generator Implant Date: 20220523
Lead Channel Impedance Value: 380 Ohm
Lead Channel Impedance Value: 434 Ohm
Lead Channel Pacing Threshold Amplitude: 0.9 V
Lead Channel Pacing Threshold Amplitude: 1 V
Lead Channel Pacing Threshold Pulse Width: 0.4 ms
Lead Channel Pacing Threshold Pulse Width: 0.4 ms
Lead Channel Sensing Intrinsic Amplitude: 4.5 mV
Lead Channel Sensing Intrinsic Amplitude: 5.2 mV
Lead Channel Setting Pacing Amplitude: 2 V
Lead Channel Setting Pacing Amplitude: 2.4 V
Lead Channel Setting Pacing Pulse Width: 0.4 ms
Lead Channel Setting Sensing Sensitivity: 0.5 mV
Pulse Gen Serial Number: 232629

## 2022-03-01 NOTE — Progress Notes (Signed)
Electrophysiology Office Note Date: 03/01/2022  ID:  Tyler Hahn, Tyler Hahn 01/03/1941, MRN [id number]  PCP: [name], MD Primary Cardiologist: None Electrophysiologist: Virl Axe, MD   CC: Routine ICD follow-up  Tyler Hahn is a 81 y.o. male seen today for Virl Axe, MD for routine electrophysiology followup. Since last being seen in our clinic the patient reports doing very well from a cardiac perspective. K baths have increased at HD, and he has had no further therapies since april.  he denies chest pain, palpitations, dyspnea, PND, orthopnea, nausea, vomiting, dizziness, syncope, edema, weight gain, or early satiety.   Device History: Engineer, agricultural ICD implanted 2009, gen change 10/2020 for CHF Appropriate therapy: yes, 09/2021 in setting of HD -> HypoK   Past Medical History:  Diagnosis Date   Anxiety    Automatic implantable cardioverter-defibrillator in situ    greg taylor   CHF (congestive heart failure) ([place])    2000   Diabetes mellitus    no meds   Fatty liver    Gout    "bout 2-3 months ago"-meds helped.   Hyperlipidemia    Hypertension    Hyperthyroidism    Kidney cysts    PONV (postoperative nausea and vomiting)    Renal insufficiency    Past Surgical History:  Procedure Laterality Date   AV FISTULA PLACEMENT Right 08/23/2019   Procedure: ARTERIOVENOUS GORTEX GRAFT RIGHT ARM;  Surgeon: Rosetta Posner, MD;  Location: [facility] OR;  Service: Vascular;  Laterality: Right;   CARDIAC DEFIBRILLATOR PLACEMENT     COLONOSCOPY WITH PROPOFOL N/A 10/03/2015   Procedure: COLONOSCOPY WITH PROPOFOL;  Surgeon: Ladene Artist, MD;  Location: [facility];  Service: Endoscopy;  Laterality: N/A;   DIAGNOSTIC LAPAROSCOPY  01/27/2014   Dr Brantley Stage   ENTEROSCOPY N/A 11/25/2013   Procedure: ENTEROSCOPY;  Surgeon: Ladene Artist, MD;  Location: [facility];  Service: Endoscopy;  Laterality: N/A;   ICD GENERATOR CHANGEOUT N/A 11/06/2020   Procedure:  ICD GENERATOR CHANGEOUT;  Surgeon: [name], MD;  Location: [facility];  Service: Cardiovascular;  Laterality: N/A;   ICD,[employer]     LAPAROSCOPY N/A 01/27/2014   Procedure: LAPAROSCOPY DIAGNOSTIC;  Surgeon: Joyice Faster. Cornett, MD;  Location: [facility] OR;  Service: General;  Laterality: N/A;   polyp removal throat     difficulty speaking    Current Outpatient Medications  Medication Sig Dispense Refill   allopurinol (ZYLOPRIM) 100 MG tablet TAKE 1 TABLET BY MOUTH 3 TIMES A WEEK ON MONDAY, WEDNESDAY AND FRIDAY 12 tablet 2   atorvastatin (LIPITOR) 20 MG tablet TAKE 1 TABLET BY MOUTH EVERY DAY 30 tablet 5   bacitracin ointment Apply 1 application. topically 2 (two) times daily. 120 g 0   donepezil (ARICEPT) 10 MG tablet Take 1 tablet (10 mg total) by mouth at bedtime. 90 tablet 3   hydrocortisone cream 1 % Apply to affected area 2 times daily 15 g 0   levothyroxine (SYNTHROID) 25 MCG tablet TAKE 1 TABLET BY MOUTH EVERY DAY BEFORE BREAKFAST 30 tablet 5   loratadine (CLARITIN) 10 MG tablet Take 1 tablet (10 mg total) by mouth daily. 90 tablet 3   Multiple Vitamins-Minerals (MULTIVITAMIN WITH MINERALS) tablet Take 1 tablet by mouth daily.     omeprazole (PRILOSEC) 40 MG capsule Take 1 capsule (40 mg total) by mouth daily. 30 capsule 3   OVER THE COUNTER MEDICATION Take 1 capsule by mouth daily. Brain Food  sertraline (ZOLOFT) 100 MG tablet TAKE 1 TABLET BY MOUTH EVERY DAY 30 tablet 2   trimethoprim-polymyxin b (POLYTRIM) ophthalmic solution Place 1 drop into both eyes 2 (two) times daily. 10 mL 0   No current facility-administered medications for this visit.    Allergies:   Sulfa antibiotics and Sulfonamide derivatives   Social History: Social History   Socioeconomic History   Marital status: Widowed    Spouse name: Not on file   Number of children: 4   Years of education: Not on file   Highest education level: Not on file  Occupational History   Occupation: Retired     Fish farm manager: RETIRED  Tobacco Use   Smoking status: Former    Types: Cigarettes    Quit date: 11/26/1998    Years since quitting: 23.2   Smokeless tobacco: Never  Vaping Use   Vaping Use: Never used  Substance and Sexual Activity   Alcohol use: No    Alcohol/week: 0.0 standard drinks of alcohol   Drug use: No   Sexual activity: Not on file  Other Topics Concern   Not on file  Social History Narrative   Not on file   Social Determinants of Health   Financial Resource Strain: Low Risk  (12/26/2021)   Overall Financial Resource Strain (CARDIA)    Difficulty of Paying Living Expenses: Not hard at all  Food Insecurity: No Food Insecurity (12/26/2021)   Hunger Vital Sign    Worried About Running Out of Food in the Last Year: Never true    [place] in the Last Year: Never true  Transportation Needs: No Transportation Needs (12/26/2021)   PRAPARE - Hydrologist (Medical): No    Lack of Transportation (Non-Medical): No  Physical Activity: Insufficiently Active (12/26/2021)   Exercise Vital Sign    Days of Exercise per Week: 3 days    Minutes of Exercise per Session: 30 min  Stress: No Stress Concern Present (12/26/2021)   [place]    Feeling of Stress : Not at all  Social Connections: Moderately Integrated (12/26/2021)   Social Connection and Isolation Panel [NHANES]    Frequency of Communication with Friends and Family: Three times a week    Frequency of Social Gatherings with Friends and Family: Three times a week    Attends Religious Services: 1 to 4 times per year    Active Member of Clubs or Organizations: Yes    Attends Archivist Meetings: 1 to 4 times per year    Marital Status: Widowed  Intimate Partner Violence: Not At Risk (12/26/2021)   Humiliation, Afraid, Rape, and Kick questionnaire    Fear of Current or Ex-Partner: No    Emotionally Abused: No     Physically Abused: No    Sexually Abused: No    Family History: Family History  Problem Relation Age of Onset   Stomach cancer Mother    Alzheimer's disease Mother    Diabetes Father    Heart disease Father    Liver disease Father    Kidney disease Father     Review of Systems: All other systems reviewed and are otherwise negative except as noted above.   Physical Exam: Vitals:   03/01/22 1048  BP: 122/64  Pulse: 83  SpO2: 96%  Weight: 171 lb (77.6 kg)  Height: '5\' 7"'$  (1.702 m)     GEN- The patient is well appearing, alert and  oriented x 3 today.   HEENT: normocephalic, atraumatic; sclera clear, conjunctiva pink; hearing intact; oropharynx clear; neck supple, no JVP Lymph- no cervical lymphadenopathy Lungs- Clear to ausculation bilaterally, normal work of breathing.  No wheezes, rales, rhonchi Heart- Regular  rate and rhythm, no murmurs, rubs or gallops, PMI not laterally displaced GI- soft, non-tender, non-distended, bowel sounds present, no hepatosplenomegaly Extremities- no clubbing or cyanosis. No peripheral edema; DP/PT/radial pulses 2+ bilaterally MS- no significant deformity or atrophy Skin- warm and dry, no rash or lesion; ICD pocket well healed Psych- euthymic mood, full affect Neuro- strength and sensation are intact  ICD interrogation- reviewed in detail today,  See PACEART report  EKG:  EKG is not ordered today. Personal review of EKG ordered  10/04/2021  shows NSR at 75 bpm  Recent Labs: 02/25/2022: ALT 17; BUN 49; Creatinine, Ser 7.73; Hemoglobin 11.4; Platelets 124.0; Potassium 4.8; Sodium 136; TSH 5.07   Wt Readings from Last 3 Encounters:  03/01/22 171 lb (77.6 kg)  02/25/22 180 lb 3.2 oz (81.7 kg)  12/26/21 173 lb (78.5 kg)     Other studies Reviewed: Additional studies/ records that were reviewed today include: Previous EP office notes.   Assessment and Plan:  1.  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  Pt does not drive.  If recurs, only AAD option would be amiodarone.    Current medicines are reviewed at length with the patient today.    Disposition:   Follow up with Dr. Caryl Comes in 12 months    Signed, Shirley Friar, PA-C  03/01/2022 11:23 AM  [facility] [address] [place] [id number] [phone] (office) [phone] (fax)

## 2022-03-01 NOTE — Patient Instructions (Signed)
Medication Instructions:  Your physician recommends that you continue on your current medications as directed. Please refer to the Current Medication list given to you today.  *If you need a refill on your cardiac medications before your next appointment, please call your pharmacy*   Lab Work: None If you have labs (blood work) drawn today and your tests are completely normal, you will receive your results only by: [place] (if you have MyChart) OR A paper copy in the mail If you have any lab test that is abnormal or we need to change your treatment, we will call you to review the results.   Follow-Up: At [facility], you and your health needs are our priority.  As part of our continuing mission to provide you with exceptional heart care, we have created designated Provider Care Teams.  These Care Teams include your primary Cardiologist (physician) and Advanced Practice Providers (APPs -  Physician Assistants and Nurse Practitioners) who all work together to provide you with the care you need, when you need it.  Your next appointment:   1 year(s)  The format for your next appointment:   In Person  Provider:   Virl Axe, MD

## 2022-03-02 DIAGNOSIS — Z992 Dependence on renal dialysis: Secondary | ICD-10-CM | POA: Diagnosis not present

## 2022-03-02 DIAGNOSIS — N2581 Secondary hyperparathyroidism of renal origin: Secondary | ICD-10-CM | POA: Diagnosis not present

## 2022-03-02 DIAGNOSIS — D689 Coagulation defect, unspecified: Secondary | ICD-10-CM | POA: Diagnosis not present

## 2022-03-02 DIAGNOSIS — N186 End stage renal disease: Secondary | ICD-10-CM | POA: Diagnosis not present

## 2022-03-02 DIAGNOSIS — D509 Iron deficiency anemia, unspecified: Secondary | ICD-10-CM | POA: Diagnosis not present

## 2022-03-05 DIAGNOSIS — N2581 Secondary hyperparathyroidism of renal origin: Secondary | ICD-10-CM | POA: Diagnosis not present

## 2022-03-05 DIAGNOSIS — D689 Coagulation defect, unspecified: Secondary | ICD-10-CM | POA: Diagnosis not present

## 2022-03-05 DIAGNOSIS — D631 Anemia in chronic kidney disease: Secondary | ICD-10-CM | POA: Diagnosis not present

## 2022-03-05 DIAGNOSIS — N186 End stage renal disease: Secondary | ICD-10-CM | POA: Diagnosis not present

## 2022-03-05 DIAGNOSIS — Z992 Dependence on renal dialysis: Secondary | ICD-10-CM | POA: Diagnosis not present

## 2022-03-07 DIAGNOSIS — D689 Coagulation defect, unspecified: Secondary | ICD-10-CM | POA: Diagnosis not present

## 2022-03-07 DIAGNOSIS — Z992 Dependence on renal dialysis: Secondary | ICD-10-CM | POA: Diagnosis not present

## 2022-03-07 DIAGNOSIS — D631 Anemia in chronic kidney disease: Secondary | ICD-10-CM | POA: Diagnosis not present

## 2022-03-07 DIAGNOSIS — N2581 Secondary hyperparathyroidism of renal origin: Secondary | ICD-10-CM | POA: Diagnosis not present

## 2022-03-07 DIAGNOSIS — N186 End stage renal disease: Secondary | ICD-10-CM | POA: Diagnosis not present

## 2022-03-09 DIAGNOSIS — D631 Anemia in chronic kidney disease: Secondary | ICD-10-CM | POA: Diagnosis not present

## 2022-03-09 DIAGNOSIS — Z992 Dependence on renal dialysis: Secondary | ICD-10-CM | POA: Diagnosis not present

## 2022-03-09 DIAGNOSIS — N186 End stage renal disease: Secondary | ICD-10-CM | POA: Diagnosis not present

## 2022-03-09 DIAGNOSIS — D689 Coagulation defect, unspecified: Secondary | ICD-10-CM | POA: Diagnosis not present

## 2022-03-09 DIAGNOSIS — N2581 Secondary hyperparathyroidism of renal origin: Secondary | ICD-10-CM | POA: Diagnosis not present

## 2022-03-12 DIAGNOSIS — N2581 Secondary hyperparathyroidism of renal origin: Secondary | ICD-10-CM | POA: Diagnosis not present

## 2022-03-12 DIAGNOSIS — D689 Coagulation defect, unspecified: Secondary | ICD-10-CM | POA: Diagnosis not present

## 2022-03-12 DIAGNOSIS — Z992 Dependence on renal dialysis: Secondary | ICD-10-CM | POA: Diagnosis not present

## 2022-03-12 DIAGNOSIS — N186 End stage renal disease: Secondary | ICD-10-CM | POA: Diagnosis not present

## 2022-03-12 DIAGNOSIS — E876 Hypokalemia: Secondary | ICD-10-CM | POA: Diagnosis not present

## 2022-03-13 DIAGNOSIS — I871 Compression of vein: Secondary | ICD-10-CM | POA: Diagnosis not present

## 2022-03-13 DIAGNOSIS — T82858A Stenosis of vascular prosthetic devices, implants and grafts, initial encounter: Secondary | ICD-10-CM | POA: Diagnosis not present

## 2022-03-13 DIAGNOSIS — N186 End stage renal disease: Secondary | ICD-10-CM | POA: Diagnosis not present

## 2022-03-13 DIAGNOSIS — Z992 Dependence on renal dialysis: Secondary | ICD-10-CM | POA: Diagnosis not present

## 2022-03-14 DIAGNOSIS — Z992 Dependence on renal dialysis: Secondary | ICD-10-CM | POA: Diagnosis not present

## 2022-03-14 DIAGNOSIS — N2581 Secondary hyperparathyroidism of renal origin: Secondary | ICD-10-CM | POA: Diagnosis not present

## 2022-03-14 DIAGNOSIS — N186 End stage renal disease: Secondary | ICD-10-CM | POA: Diagnosis not present

## 2022-03-14 DIAGNOSIS — E876 Hypokalemia: Secondary | ICD-10-CM | POA: Diagnosis not present

## 2022-03-14 DIAGNOSIS — D689 Coagulation defect, unspecified: Secondary | ICD-10-CM | POA: Diagnosis not present

## 2022-03-14 NOTE — Progress Notes (Signed)
Remote ICD transmission.   

## 2022-03-16 DIAGNOSIS — E876 Hypokalemia: Secondary | ICD-10-CM | POA: Diagnosis not present

## 2022-03-16 DIAGNOSIS — D689 Coagulation defect, unspecified: Secondary | ICD-10-CM | POA: Diagnosis not present

## 2022-03-16 DIAGNOSIS — Z992 Dependence on renal dialysis: Secondary | ICD-10-CM | POA: Diagnosis not present

## 2022-03-16 DIAGNOSIS — I129 Hypertensive chronic kidney disease with stage 1 through stage 4 chronic kidney disease, or unspecified chronic kidney disease: Secondary | ICD-10-CM | POA: Diagnosis not present

## 2022-03-16 DIAGNOSIS — N2581 Secondary hyperparathyroidism of renal origin: Secondary | ICD-10-CM | POA: Diagnosis not present

## 2022-03-16 DIAGNOSIS — N186 End stage renal disease: Secondary | ICD-10-CM | POA: Diagnosis not present

## 2022-03-19 DIAGNOSIS — D689 Coagulation defect, unspecified: Secondary | ICD-10-CM | POA: Diagnosis not present

## 2022-03-19 DIAGNOSIS — N186 End stage renal disease: Secondary | ICD-10-CM | POA: Diagnosis not present

## 2022-03-19 DIAGNOSIS — Z992 Dependence on renal dialysis: Secondary | ICD-10-CM | POA: Diagnosis not present

## 2022-03-19 DIAGNOSIS — N2581 Secondary hyperparathyroidism of renal origin: Secondary | ICD-10-CM | POA: Diagnosis not present

## 2022-03-21 DIAGNOSIS — Z992 Dependence on renal dialysis: Secondary | ICD-10-CM | POA: Diagnosis not present

## 2022-03-21 DIAGNOSIS — D689 Coagulation defect, unspecified: Secondary | ICD-10-CM | POA: Diagnosis not present

## 2022-03-21 DIAGNOSIS — N2581 Secondary hyperparathyroidism of renal origin: Secondary | ICD-10-CM | POA: Diagnosis not present

## 2022-03-21 DIAGNOSIS — N186 End stage renal disease: Secondary | ICD-10-CM | POA: Diagnosis not present

## 2022-03-23 DIAGNOSIS — D689 Coagulation defect, unspecified: Secondary | ICD-10-CM | POA: Diagnosis not present

## 2022-03-23 DIAGNOSIS — N186 End stage renal disease: Secondary | ICD-10-CM | POA: Diagnosis not present

## 2022-03-23 DIAGNOSIS — Z992 Dependence on renal dialysis: Secondary | ICD-10-CM | POA: Diagnosis not present

## 2022-03-23 DIAGNOSIS — N2581 Secondary hyperparathyroidism of renal origin: Secondary | ICD-10-CM | POA: Diagnosis not present

## 2022-03-26 DIAGNOSIS — D689 Coagulation defect, unspecified: Secondary | ICD-10-CM | POA: Diagnosis not present

## 2022-03-26 DIAGNOSIS — E1122 Type 2 diabetes mellitus with diabetic chronic kidney disease: Secondary | ICD-10-CM | POA: Diagnosis not present

## 2022-03-26 DIAGNOSIS — Z992 Dependence on renal dialysis: Secondary | ICD-10-CM | POA: Diagnosis not present

## 2022-03-26 DIAGNOSIS — N186 End stage renal disease: Secondary | ICD-10-CM | POA: Diagnosis not present

## 2022-03-26 DIAGNOSIS — N2581 Secondary hyperparathyroidism of renal origin: Secondary | ICD-10-CM | POA: Diagnosis not present

## 2022-03-28 DIAGNOSIS — N186 End stage renal disease: Secondary | ICD-10-CM | POA: Diagnosis not present

## 2022-03-28 DIAGNOSIS — Z992 Dependence on renal dialysis: Secondary | ICD-10-CM | POA: Diagnosis not present

## 2022-03-28 DIAGNOSIS — E1122 Type 2 diabetes mellitus with diabetic chronic kidney disease: Secondary | ICD-10-CM | POA: Diagnosis not present

## 2022-03-28 DIAGNOSIS — N2581 Secondary hyperparathyroidism of renal origin: Secondary | ICD-10-CM | POA: Diagnosis not present

## 2022-03-28 DIAGNOSIS — D689 Coagulation defect, unspecified: Secondary | ICD-10-CM | POA: Diagnosis not present

## 2022-03-30 DIAGNOSIS — N2581 Secondary hyperparathyroidism of renal origin: Secondary | ICD-10-CM | POA: Diagnosis not present

## 2022-03-30 DIAGNOSIS — N186 End stage renal disease: Secondary | ICD-10-CM | POA: Diagnosis not present

## 2022-03-30 DIAGNOSIS — D689 Coagulation defect, unspecified: Secondary | ICD-10-CM | POA: Diagnosis not present

## 2022-03-30 DIAGNOSIS — E1122 Type 2 diabetes mellitus with diabetic chronic kidney disease: Secondary | ICD-10-CM | POA: Diagnosis not present

## 2022-03-30 DIAGNOSIS — Z992 Dependence on renal dialysis: Secondary | ICD-10-CM | POA: Diagnosis not present

## 2022-04-01 ENCOUNTER — Other Ambulatory Visit: Payer: Self-pay | Admitting: Family Medicine

## 2022-04-02 DIAGNOSIS — Z992 Dependence on renal dialysis: Secondary | ICD-10-CM | POA: Diagnosis not present

## 2022-04-02 DIAGNOSIS — D689 Coagulation defect, unspecified: Secondary | ICD-10-CM | POA: Diagnosis not present

## 2022-04-02 DIAGNOSIS — N186 End stage renal disease: Secondary | ICD-10-CM | POA: Diagnosis not present

## 2022-04-02 DIAGNOSIS — D631 Anemia in chronic kidney disease: Secondary | ICD-10-CM | POA: Diagnosis not present

## 2022-04-02 DIAGNOSIS — N2581 Secondary hyperparathyroidism of renal origin: Secondary | ICD-10-CM | POA: Diagnosis not present

## 2022-04-03 DIAGNOSIS — H524 Presbyopia: Secondary | ICD-10-CM | POA: Diagnosis not present

## 2022-04-03 DIAGNOSIS — H52223 Regular astigmatism, bilateral: Secondary | ICD-10-CM | POA: Diagnosis not present

## 2022-04-04 DIAGNOSIS — N186 End stage renal disease: Secondary | ICD-10-CM | POA: Diagnosis not present

## 2022-04-04 DIAGNOSIS — N2581 Secondary hyperparathyroidism of renal origin: Secondary | ICD-10-CM | POA: Diagnosis not present

## 2022-04-04 DIAGNOSIS — D631 Anemia in chronic kidney disease: Secondary | ICD-10-CM | POA: Diagnosis not present

## 2022-04-04 DIAGNOSIS — D689 Coagulation defect, unspecified: Secondary | ICD-10-CM | POA: Diagnosis not present

## 2022-04-04 DIAGNOSIS — Z992 Dependence on renal dialysis: Secondary | ICD-10-CM | POA: Diagnosis not present

## 2022-04-06 ENCOUNTER — Other Ambulatory Visit: Payer: Self-pay | Admitting: Family Medicine

## 2022-04-06 DIAGNOSIS — D631 Anemia in chronic kidney disease: Secondary | ICD-10-CM | POA: Diagnosis not present

## 2022-04-06 DIAGNOSIS — N186 End stage renal disease: Secondary | ICD-10-CM | POA: Diagnosis not present

## 2022-04-06 DIAGNOSIS — D689 Coagulation defect, unspecified: Secondary | ICD-10-CM | POA: Diagnosis not present

## 2022-04-06 DIAGNOSIS — N2581 Secondary hyperparathyroidism of renal origin: Secondary | ICD-10-CM | POA: Diagnosis not present

## 2022-04-06 DIAGNOSIS — Z992 Dependence on renal dialysis: Secondary | ICD-10-CM | POA: Diagnosis not present

## 2022-04-08 ENCOUNTER — Encounter: Payer: Self-pay | Admitting: Family Medicine

## 2022-04-08 ENCOUNTER — Ambulatory Visit (INDEPENDENT_AMBULATORY_CARE_PROVIDER_SITE_OTHER): Payer: Medicare HMO | Admitting: Family Medicine

## 2022-04-08 VITALS — BP 112/58 | HR 57 | Temp 98.6°F | Ht 67.0 in | Wt 176.2 lb

## 2022-04-08 DIAGNOSIS — R0982 Postnasal drip: Secondary | ICD-10-CM | POA: Diagnosis not present

## 2022-04-08 DIAGNOSIS — R059 Cough, unspecified: Secondary | ICD-10-CM

## 2022-04-08 MED ORDER — FLUTICASONE PROPIONATE 50 MCG/ACT NA SUSP: 1.0000 | Freq: Every day | NASAL | 6 refills | Status: DC

## 2022-04-08 MED ORDER — IPRATROPIUM BROMIDE 0.06 % NA SOLN: 1.0000 | Freq: Four times a day (QID) | NASAL | 5 refills | Status: DC

## 2022-04-08 NOTE — Progress Notes (Signed)
Subjective:  Patient ID: Tyler Hahn, male    DOB: 1941-03-09  Age: 81 y.o. MRN: [id number]  CC:  Chief Complaint  Patient presents with   Cough    Dry cough at night pt son says it sounds like congestion in his chest, pt states the cough has been going on for 1 week, pt states he hasn't done anything for the cough   Depression    PHQ9 - 7    HPI Tyler Hahn presents for   Cough: Here with son. Cough past 10 days. Dry cough, no dyspnea. No chest pain. No orthopnea, no paroxysmal nocturnal dyspnea. No new edema.  No heartburn. Occasionally clearing throat.. some nasal congestion and postnasal drip. Taking claritin daily.  Worse at night lying down. No insomnia.  No fever, HA or bodyache.   Positive depression screening: Managed by primary care provider, does take sertraline. No SI, stable.     04/08/2022   11:17 AM 02/25/2022   10:23 AM 12/26/2021   10:03 AM 12/26/2021   10:01 AM 11/26/2021   11:17 AM  Depression screen PHQ 2/9  Decreased Interest 1 1 0 0 0  Down, Depressed, Hopeless 1 0 0 0 1  PHQ - 2 Score 2 1 0 0 1  Altered sleeping 1 3   0  Tired, decreased energy 1 1   0  Change in appetite 1 0   1  Feeling bad or failure about yourself  0 0   1  Trouble concentrating 2 0   0  Moving slowly or fidgety/restless 0 0   0  Suicidal thoughts 0 0   0  PHQ-9 Score '7 5   3  '$ Difficult doing work/chores  Not difficult at all   Not difficult at all     History Patient Active Problem List   Diagnosis Date Noted   Avulsion of nail of right middle finger 10/30/2021   Felon of finger 08/24/2021   Multiple falls 08/03/2021   VT (ventricular tachycardia) ([place]) 03/12/2021   Memory loss 10/16/2020   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 03/02/2020   Weakness    ESRD (end stage renal disease) ([place]) 08/30/2019   History of anemia due to CKD 08/18/2019   Hyperkalemia 08/17/2019   Hypothyroid 08/16/2019   Physical exam 02/18/2019   Greater trochanteric  bursitis of left hip 08/04/2018   Osteoarthritis 09/25/2016   Screen for colon cancer    Benign neoplasm of transverse colon    Edema 07/28/2014   Adjustment disorder with depressed mood 03/24/2014   Small bowel mass 12/20/2013   Myalgia 07/07/2013   Loss of weight 04/19/2013   Polyarthralgia 04/19/2013   Loss of appetite 04/19/2013   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) 92/04/9416   SYSTOLIC HEART FAILURE, CHRONIC 06/07/2010   TESTICULAR MASS 04/04/2010   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[place]) 03/07/2010   Hyperlipidemia 03/07/2010   GOUT 03/07/2010   Essential hypertension 03/07/2010   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   Past Medical History:  Diagnosis Date   Anxiety    Automatic implantable cardioverter-defibrillator in situ    greg taylor   CHF (congestive heart failure) ([place])    2000   Diabetes mellitus    no meds   Fatty liver    Gout    "bout 2-3 months ago"-meds helped.   Hyperlipidemia    Hypertension    Hyperthyroidism    Kidney cysts  PONV (postoperative nausea and vomiting)    Renal insufficiency    Past Surgical History:  Procedure Laterality Date   AV FISTULA PLACEMENT Right 08/23/2019   Procedure: ARTERIOVENOUS GORTEX GRAFT RIGHT ARM;  Surgeon: Rosetta Posner, MD;  Location: [facility] OR;  Service: Vascular;  Laterality: Right;   CARDIAC DEFIBRILLATOR PLACEMENT     COLONOSCOPY WITH PROPOFOL N/A 10/03/2015   Procedure: COLONOSCOPY WITH PROPOFOL;  Surgeon: Ladene Artist, MD;  Location: [facility];  Service: Endoscopy;  Laterality: N/A;   DIAGNOSTIC LAPAROSCOPY  01/27/2014   Dr Brantley Stage   ENTEROSCOPY N/A 11/25/2013   Procedure: ENTEROSCOPY;  Surgeon: Ladene Artist, MD;  Location: [facility];  Service: Endoscopy;  Laterality: N/A;   ICD GENERATOR CHANGEOUT N/A 11/06/2020   Procedure: ICD GENERATOR CHANGEOUT;  Surgeon: [name], MD;  Location: [facility];   Service: Cardiovascular;  Laterality: N/A;   ICD,[place] [employer]     LAPAROSCOPY N/A 01/27/2014   Procedure: LAPAROSCOPY DIAGNOSTIC;  Surgeon: Joyice Faster. Cornett, MD;  Location: [facility];  Service: General;  Laterality: N/A;   polyp removal throat     difficulty speaking   Allergies  Allergen Reactions   Sulfa Antibiotics Itching and Rash   Sulfonamide Derivatives Itching and Rash   Prior to Admission medications   Medication Sig Start Date End Date Taking? Authorizing Provider  allopurinol (ZYLOPRIM) 100 MG tablet TAKE 1 TABLET BY MOUTH 3 TIMES A WEEK ON MONDAY, [facility] AND FRIDAY 02/21/22  Yes [name], MD  atorvastatin (LIPITOR) 20 MG tablet TAKE 1 TABLET BY MOUTH EVERY DAY 10/01/21  Yes [name], MD  donepezil (ARICEPT) 10 MG tablet Take 1 tablet (10 mg total) by mouth at bedtime. 05/23/21  Yes [name], MD  levothyroxine (SYNTHROID) 25 MCG tablet TAKE 1 TABLET BY MOUTH EVERY DAY BEFORE BREAKFAST 12/06/21  Yes [name], MD  loratadine (CLARITIN) 10 MG tablet Take 1 tablet (10 mg total) by mouth daily. 02/25/22  Yes [name], MD  Multiple Vitamins-Minerals (MULTIVITAMIN WITH MINERALS) tablet Take 1 tablet by mouth daily.   Yes [provider]  omeprazole (PRILOSEC) 40 MG capsule TAKE 1 CAPSULE BY MOUTH DAILY. 04/01/22  Yes [name], MD  sertraline (ZOLOFT) 100 MG tablet TAKE 1 TABLET BY MOUTH EVERY DAY 04/08/22  Yes [name], MD  bacitracin ointment Apply 1 application. topically 2 (two) times daily. Patient not taking: Reported on 04/08/2022 08/22/21   Sherwood Gambler, MD  hydrocortisone cream 1 % Apply to affected area 2 times daily Patient not taking: Reported on 04/08/2022 09/29/21   Hans Eden, NP  OVER THE COUNTER MEDICATION Take 1 capsule by mouth daily. Brain Food Patient not taking: Reported on 04/08/2022    [provider]  trimethoprim-polymyxin b (POLYTRIM) ophthalmic solution Place 1  drop into both eyes 2 (two) times daily. Patient not taking: Reported on 04/08/2022 09/29/21   Hans Eden, NP   Social History   Socioeconomic History   Marital status: Widowed    Spouse name: Not on file   Number of children: 4   Years of education: Not on file   Highest education level: Not on file  Occupational History   Occupation: Retired    Fish farm manager: RETIRED  Tobacco Use   Smoking status: Former    Types: Cigarettes    Quit date: 11/26/1998    Years since quitting: 23.3   Smokeless tobacco: Never  Vaping Use  Vaping Use: Never used  Substance and Sexual Activity   Alcohol use: No    Alcohol/week: 0.0 standard drinks of alcohol   Drug use: No   Sexual activity: Not on file  Other Topics Concern   Not on file  Social History Narrative   Not on file   Social Determinants of Health   Financial Resource Strain: Low Risk  (12/26/2021)   Overall Financial Resource Strain (CARDIA)    Difficulty of Paying Living Expenses: Not hard at all  Food Insecurity: No Food Insecurity (12/26/2021)   Hunger Vital Sign    Worried About Running Out of Food in the Last Year: Never true    Ran Out of Food in the Last Year: Never true  Transportation Needs: No Transportation Needs (12/26/2021)   PRAPARE - Hydrologist (Medical): No    Lack of Transportation (Non-Medical): No  Physical Activity: Insufficiently Active (12/26/2021)   Exercise Vital Sign    Days of Exercise per Week: 3 days    Minutes of Exercise per Session: 30 min  Stress: No Stress Concern Present (12/26/2021)   [place]    Feeling of Stress : Not at all  Social Connections: Moderately Integrated (12/26/2021)   Social Connection and Isolation Panel [NHANES]    Frequency of Communication with Friends and Family: Three times a week    Frequency of Social Gatherings with Friends and Family: Three times a week    Attends  Religious Services: 1 to 4 times per year    Active Member of Clubs or Organizations: Yes    Attends Archivist Meetings: 1 to 4 times per year    Marital Status: Widowed  Intimate Partner Violence: Not At Risk (12/26/2021)   Humiliation, Afraid, Rape, and Kick questionnaire    Fear of Current or Ex-Partner: No    Emotionally Abused: No    Physically Abused: No    Sexually Abused: No    Review of Systems   Objective:   Vitals:   04/08/22 1122  BP: (!) 112/58  Pulse: (!) 57  Temp: 98.6 F (37 C)  SpO2: 97%  Weight: 176 lb 3.2 oz (79.9 kg)  Height: '5\' 7"'$  (1.702 m)     Physical Exam Vitals reviewed.  Constitutional:      Appearance: He is well-developed.  HENT:     Head: Normocephalic and atraumatic.     Right Ear: Tympanic membrane, ear canal and external ear normal.     Left Ear: Tympanic membrane, ear canal and external ear normal.     Nose: No congestion or rhinorrhea.     Mouth/Throat:     Pharynx: No oropharyngeal exudate or posterior oropharyngeal erythema.  Eyes:     Conjunctiva/sclera: Conjunctivae normal.     Pupils: Pupils are equal, round, and reactive to light.  Neck:     Vascular: No carotid bruit or JVD.  Cardiovascular:     Rate and Rhythm: Normal rate and regular rhythm.     Heart sounds: Normal heart sounds. No murmur heard. Pulmonary:     Effort: Pulmonary effort is normal. No respiratory distress.     Breath sounds: Normal breath sounds. No stridor. No wheezing, rhonchi or rales.  Abdominal:     Palpations: Abdomen is soft.     Tenderness: There is no abdominal tenderness.  Musculoskeletal:     Cervical back: Neck supple.     Right lower leg: No  edema.     Left lower leg: No edema.  Lymphadenopathy:     Cervical: No cervical adenopathy.  Skin:    General: Skin is warm and dry.     Findings: No rash.  Neurological:     Mental Status: He is alert and oriented to person, place, and time.  Psychiatric:        Mood and Affect:  Mood normal.        Behavior: Behavior normal.        Assessment & Plan:  HASSAN BLACKSHIRE is a 81 y.o. male . Postnasal drip - Plan: fluticasone (FLONASE) 50 MCG/ACT nasal spray, ipratropium (ATROVENT) 0.06 % nasal spray  Cough, unspecified type - Plan: fluticasone (FLONASE) 50 MCG/ACT nasal spray, ipratropium (ATROVENT) 0.06 % nasal spray Lungs clear.  No chest pain or dyspnea.  No other infectious symptoms.  Unlikely CHF, unlikely lower respiratory illness.   reassuring history, exam.  Suspected postnasal drip cough.  We will try Flonase, then option of Atrovent nasal spray if not effective.  No other med changes.  RTC precautions given.  If not improving in the next week to 10 days, check chest x-ray, sooner if worse.  Meds ordered this encounter  Medications   fluticasone (FLONASE) 50 MCG/ACT nasal spray    Sig: Place 1 spray into both nostrils daily.    Dispense:  16 g    Refill:  6   ipratropium (ATROVENT) 0.06 % nasal spray    Sig: Place 1-2 sprays into both nostrils 4 (four) times daily. As needed for nasal congestion    Dispense:  15 mL    Refill:  5   Patient Instructions  See below for cough info.  I suspect cough is from the postnasal drip or nasal congestion.  Try Flonase 1 spray in each nostril once per day for now but if that does not help in the next few days can try the Atrovent nasal spray.  If cough is still not improving in the next week to 10 days at the most let me know and I will order a chest x-ray.  If any worsening symptoms or new symptoms be seen sooner. Take care and thanks for coming in today.    Cough, Adult Coughing is a reflex that clears your throat and your airways (respiratory system). Coughing helps to heal and protect your lungs. It is normal to cough occasionally, but a cough that happens with other symptoms or lasts a long time may be a sign of a condition that needs treatment. An acute cough may only last 2-3 weeks, while a chronic cough may  last 8 or more weeks. Coughing is commonly caused by: Infection of the respiratory systemby viruses or bacteria. Breathing in substances that irritate your lungs. Allergies. Asthma. Mucus that runs down the back of your throat (postnasal drip). Smoking. Acid backing up from the stomach into the esophagus (gastroesophageal reflux). Certain medicines. Chronic lung problems. Other medical conditions such as heart failure or a blood clot in the lung (pulmonary embolism). Follow these instructions at home: Medicines Take over-the-counter and prescription medicines only as told by your health care provider. Talk with your health care provider before you take a cough suppressant medicine. Lifestyle  Avoid cigarette smoke. Do not use any products that contain nicotine or tobacco, such as cigarettes, e-cigarettes, and chewing tobacco. If you need help quitting, ask your health care provider. Drink enough fluid to keep your urine pale yellow. Avoid caffeine. Do not drink  alcohol if your health care provider tells you not to drink. General instructions  Pay close attention to changes in your cough. Tell your health care provider about them. Always cover your mouth when you cough. Avoid things that make you cough, such as perfume, candles, cleaning products, or campfire or tobacco smoke. If the air is dry, use a cool mist vaporizer or humidifier in your bedroom or your home to help loosen secretions. If your cough is worse at night, try to sleep in a semi-upright position. Rest as needed. Keep all follow-up visits as told by your health care provider. This is important. Contact a health care provider if you: Have new symptoms. Cough up pus. Have a cough that does not get better after 2-3 weeks or gets worse. Cannot control your cough with cough suppressant medicines and you are losing sleep. Have pain that gets worse or pain that is not helped with medicine. Have a fever. Have unexplained  weight loss. Have night sweats. Get help right away if: You cough up blood. You have difficulty breathing. Your heartbeat is very fast. These symptoms may represent a serious problem that is an emergency. Do not wait to see if the symptoms will go away. Get medical help right away. Call your local emergency services (911 in the [place] Do not drive yourself to the hospital. Summary Coughing is a reflex that clears your throat and your airways. It is normal to cough occasionally, but a cough that happens with other symptoms or lasts a long time may be a sign of a condition that needs treatment. Take over-the-counter and prescription medicines only as told by your health care provider. Always cover your mouth when you cough. Contact a health care provider if you have new symptoms or a cough that does not get better after 2-3 weeks or gets worse. This information is not intended to replace advice given to you by your health care provider. Make sure you discuss any questions you have with your health care provider. Document Revised: 06/22/2018 Document Reviewed: 06/22/2018 Elsevier Patient Education  [place],   Merri Ray, MD [place], [facility] 04/08/22 12:00 PM

## 2022-04-08 NOTE — Patient Instructions (Addendum)
See below for cough info.  I suspect cough is from the postnasal drip or nasal congestion.  Try Flonase 1 spray in each nostril once per day for now but if that does not help in the next few days can try the Atrovent nasal spray.  If cough is still not improving in the next week to 10 days at the most let me know and I will order a chest x-ray.  If any worsening symptoms or new symptoms be seen sooner. Take care and thanks for coming in today.    Cough, Adult Coughing is a reflex that clears your throat and your airways (respiratory system). Coughing helps to heal and protect your lungs. It is normal to cough occasionally, but a cough that happens with other symptoms or lasts a long time may be a sign of a condition that needs treatment. An acute cough may only last 2-3 weeks, while a chronic cough may last 8 or more weeks. Coughing is commonly caused by: Infection of the respiratory systemby viruses or bacteria. Breathing in substances that irritate your lungs. Allergies. Asthma. Mucus that runs down the back of your throat (postnasal drip). Smoking. Acid backing up from the stomach into the esophagus (gastroesophageal reflux). Certain medicines. Chronic lung problems. Other medical conditions such as heart failure or a blood clot in the lung (pulmonary embolism). Follow these instructions at home: Medicines Take over-the-counter and prescription medicines only as told by your health care provider. Talk with your health care provider before you take a cough suppressant medicine. Lifestyle  Avoid cigarette smoke. Do not use any products that contain nicotine or tobacco, such as cigarettes, e-cigarettes, and chewing tobacco. If you need help quitting, ask your health care provider. Drink enough fluid to keep your urine pale yellow. Avoid caffeine. Do not drink alcohol if your health care provider tells you not to drink. General instructions  Pay close attention to changes in your cough.  Tell your health care provider about them. Always cover your mouth when you cough. Avoid things that make you cough, such as perfume, candles, cleaning products, or campfire or tobacco smoke. If the air is dry, use a cool mist vaporizer or humidifier in your bedroom or your home to help loosen secretions. If your cough is worse at night, try to sleep in a semi-upright position. Rest as needed. Keep all follow-up visits as told by your health care provider. This is important. Contact a health care provider if you: Have new symptoms. Cough up pus. Have a cough that does not get better after 2-3 weeks or gets worse. Cannot control your cough with cough suppressant medicines and you are losing sleep. Have pain that gets worse or pain that is not helped with medicine. Have a fever. Have unexplained weight loss. Have night sweats. Get help right away if: You cough up blood. You have difficulty breathing. Your heartbeat is very fast. These symptoms may represent a serious problem that is an emergency. Do not wait to see if the symptoms will go away. Get medical help right away. Call your local emergency services (911 in the [place] Do not drive yourself to the hospital. Summary Coughing is a reflex that clears your throat and your airways. It is normal to cough occasionally, but a cough that happens with other symptoms or lasts a long time may be a sign of a condition that needs treatment. Take over-the-counter and prescription medicines only as told by your health care provider. Always cover your mouth when  you cough. Contact a health care provider if you have new symptoms or a cough that does not get better after 2-3 weeks or gets worse. This information is not intended to replace advice given to you by your health care provider. Make sure you discuss any questions you have with your health care provider. Document Revised: 06/22/2018 Document Reviewed: 06/22/2018 Elsevier Patient Education   [place].

## 2022-04-09 DIAGNOSIS — E876 Hypokalemia: Secondary | ICD-10-CM | POA: Diagnosis not present

## 2022-04-09 DIAGNOSIS — N2581 Secondary hyperparathyroidism of renal origin: Secondary | ICD-10-CM | POA: Diagnosis not present

## 2022-04-09 DIAGNOSIS — N186 End stage renal disease: Secondary | ICD-10-CM | POA: Diagnosis not present

## 2022-04-09 DIAGNOSIS — D689 Coagulation defect, unspecified: Secondary | ICD-10-CM | POA: Diagnosis not present

## 2022-04-09 DIAGNOSIS — Z992 Dependence on renal dialysis: Secondary | ICD-10-CM | POA: Diagnosis not present

## 2022-04-11 DIAGNOSIS — E876 Hypokalemia: Secondary | ICD-10-CM | POA: Diagnosis not present

## 2022-04-11 DIAGNOSIS — Z992 Dependence on renal dialysis: Secondary | ICD-10-CM | POA: Diagnosis not present

## 2022-04-11 DIAGNOSIS — N186 End stage renal disease: Secondary | ICD-10-CM | POA: Diagnosis not present

## 2022-04-11 DIAGNOSIS — D689 Coagulation defect, unspecified: Secondary | ICD-10-CM | POA: Diagnosis not present

## 2022-04-11 DIAGNOSIS — N2581 Secondary hyperparathyroidism of renal origin: Secondary | ICD-10-CM | POA: Diagnosis not present

## 2022-04-13 DIAGNOSIS — N2581 Secondary hyperparathyroidism of renal origin: Secondary | ICD-10-CM | POA: Diagnosis not present

## 2022-04-13 DIAGNOSIS — D689 Coagulation defect, unspecified: Secondary | ICD-10-CM | POA: Diagnosis not present

## 2022-04-13 DIAGNOSIS — N186 End stage renal disease: Secondary | ICD-10-CM | POA: Diagnosis not present

## 2022-04-13 DIAGNOSIS — Z992 Dependence on renal dialysis: Secondary | ICD-10-CM | POA: Diagnosis not present

## 2022-04-13 DIAGNOSIS — E876 Hypokalemia: Secondary | ICD-10-CM | POA: Diagnosis not present

## 2022-04-16 ENCOUNTER — Emergency Department (HOSPITAL_BASED_OUTPATIENT_CLINIC_OR_DEPARTMENT_OTHER): Payer: Medicare HMO

## 2022-04-16 ENCOUNTER — Other Ambulatory Visit: Payer: Self-pay

## 2022-04-16 ENCOUNTER — Emergency Department (HOSPITAL_BASED_OUTPATIENT_CLINIC_OR_DEPARTMENT_OTHER): Payer: Medicare HMO | Admitting: Radiology

## 2022-04-16 ENCOUNTER — Emergency Department (HOSPITAL_BASED_OUTPATIENT_CLINIC_OR_DEPARTMENT_OTHER)
Admission: EM | Admit: 2022-04-16 | Discharge: 2022-04-16 | Disposition: A | Discharge status: Home / Self Care | Payer: Medicare HMO | Attending: Emergency Medicine | Admitting: Emergency Medicine

## 2022-04-16 ENCOUNTER — Encounter (HOSPITAL_BASED_OUTPATIENT_CLINIC_OR_DEPARTMENT_OTHER): Payer: Self-pay | Admitting: Emergency Medicine

## 2022-04-16 DIAGNOSIS — R410 Disorientation, unspecified: Secondary | ICD-10-CM | POA: Diagnosis not present

## 2022-04-16 DIAGNOSIS — I120 Hypertensive chronic kidney disease with stage 5 chronic kidney disease or end stage renal disease: Secondary | ICD-10-CM | POA: Diagnosis not present

## 2022-04-16 DIAGNOSIS — I700 Atherosclerosis of aorta: Secondary | ICD-10-CM | POA: Diagnosis not present

## 2022-04-16 DIAGNOSIS — I129 Hypertensive chronic kidney disease with stage 1 through stage 4 chronic kidney disease, or unspecified chronic kidney disease: Secondary | ICD-10-CM | POA: Diagnosis not present

## 2022-04-16 DIAGNOSIS — K529 Noninfective gastroenteritis and colitis, unspecified: Secondary | ICD-10-CM

## 2022-04-16 DIAGNOSIS — Z20822 Contact with and (suspected) exposure to covid-19: Secondary | ICD-10-CM | POA: Diagnosis not present

## 2022-04-16 DIAGNOSIS — D689 Coagulation defect, unspecified: Secondary | ICD-10-CM | POA: Diagnosis not present

## 2022-04-16 DIAGNOSIS — Z992 Dependence on renal dialysis: Secondary | ICD-10-CM | POA: Diagnosis not present

## 2022-04-16 DIAGNOSIS — N2581 Secondary hyperparathyroidism of renal origin: Secondary | ICD-10-CM | POA: Diagnosis not present

## 2022-04-16 DIAGNOSIS — J189 Pneumonia, unspecified organism: Secondary | ICD-10-CM | POA: Diagnosis not present

## 2022-04-16 DIAGNOSIS — N186 End stage renal disease: Secondary | ICD-10-CM | POA: Diagnosis not present

## 2022-04-16 DIAGNOSIS — R059 Cough, unspecified: Secondary | ICD-10-CM | POA: Diagnosis not present

## 2022-04-16 DIAGNOSIS — R109 Unspecified abdominal pain: Secondary | ICD-10-CM | POA: Diagnosis not present

## 2022-04-16 LAB — RESP PANEL BY RT-PCR (FLU A&B, COVID) ARPGX2
Influenza A by PCR: NEGATIVE
Influenza B by PCR: NEGATIVE
SARS Coronavirus 2 by RT PCR: NEGATIVE

## 2022-04-16 LAB — CBC
HCT: 33 % — ABNORMAL LOW (ref 39.0–52.0)
Hemoglobin: 10.7 g/dL — ABNORMAL LOW (ref 13.0–17.0)
MCH: 28.7 pg (ref 26.0–34.0)
MCHC: 32.4 g/dL (ref 30.0–36.0)
MCV: 88.5 fL (ref 80.0–100.0)
Platelets: 129 10*3/uL — ABNORMAL LOW (ref 150–400)
RBC: 3.73 MIL/uL — ABNORMAL LOW (ref 4.22–5.81)
RDW: 18.4 % — ABNORMAL HIGH (ref 11.5–15.5)
WBC: 5.6 10*3/uL (ref 4.0–10.5)
nRBC: 0 % (ref 0.0–0.2)

## 2022-04-16 LAB — COMPREHENSIVE METABOLIC PANEL
ALT: 35 U/L (ref 0–44)
AST: 35 U/L (ref 15–41)
Albumin: 4.2 g/dL (ref 3.5–5.0)
Alkaline Phosphatase: 81 U/L (ref 38–126)
Anion gap: 17 — ABNORMAL HIGH (ref 5–15)
BUN: 50 mg/dL — ABNORMAL HIGH (ref 8–23)
CO2: 23 mmol/L (ref 22–32)
Calcium: 9.1 mg/dL (ref 8.9–10.3)
Chloride: 99 mmol/L (ref 98–111)
Creatinine, Ser: 7.36 mg/dL — ABNORMAL HIGH (ref 0.61–1.24)
GFR, Estimated: 7 mL/min — ABNORMAL LOW (ref >60)
Glucose, Bld: 110 mg/dL — ABNORMAL HIGH (ref 70–99)
Potassium: 5 mmol/L (ref 3.5–5.1)
Sodium: 139 mmol/L (ref 135–145)
Total Bilirubin: 0.5 mg/dL (ref 0.3–1.2)
Total Protein: 8.3 g/dL — ABNORMAL HIGH (ref 6.5–8.1)

## 2022-04-16 LAB — LIPASE, BLOOD: Lipase: 53 U/L — ABNORMAL HIGH (ref 11–51)

## 2022-04-16 MED ORDER — DOXYCYCLINE HYCLATE 100 MG PO TABS
100.0000 mg | ORAL_TABLET | Freq: Once | ORAL | Status: AC
  Administered 2022-04-16: 100 mg via ORAL
  Filled 2022-04-16: qty 1

## 2022-04-16 MED ORDER — DOXYCYCLINE HYCLATE 100 MG PO CAPS: 100.0000 mg | ORAL_CAPSULE | Freq: Two times a day (BID) | ORAL | 0 refills | Status: DC

## 2022-04-16 MED ORDER — IOHEXOL 300 MG/ML  SOLN
100.0000 mL | Freq: Once | INTRAMUSCULAR | Status: AC | PRN
  Administered 2022-04-16: 75 mL via INTRAVENOUS

## 2022-04-16 MED ORDER — ONDANSETRON 4 MG PO TBDP: ORAL_TABLET | ORAL | 0 refills | Status: DC

## 2022-04-16 NOTE — ED Triage Notes (Signed)
Pt presents for lower limb edema, N/V/D and epigastric pain that started yesterday. Copugh x1 week that family notes sounds "wetter" today. He has a defibrillator, no events associated with this complaint. Takes HD T-Th-Sat, no missed treatments.  Denies fevers, SOB

## 2022-04-16 NOTE — ED Provider Notes (Signed)
[facility] EMERGENCY DEPT  Provider Note  CSN: [id number] Arrival date & time: 04/16/22 0302  History Chief Complaint  Patient presents with   Abdominal Pain   Nausea    Tyler Hahn is a 81 y.o. 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 Not associated with fever but has had a productive cough for a couple of weeks. Family reports he sometimes wakes up confused but has otherwise been in his usual state of health. Has not missed any dialysis sessions, due for today's treatment around 11am. He has not had any vomiting since arriving to the ED. No longer complaining of abdominal pain. Patient is unclear on some of the details of his recent history but family helps fill in the blanks.    Home Medications Prior to Admission medications   Medication Sig Start Date End Date Taking? Authorizing Provider  doxycycline (VIBRAMYCIN) 100 MG capsule Take 1 capsule (100 mg total) by mouth 2 (two) times daily. 04/16/22  Yes Truddie Hidden, MD  allopurinol (ZYLOPRIM) 100 MG tablet TAKE 1 TABLET BY MOUTH 3 TIMES A WEEK ON MONDAY, [facility] AND FRIDAY 02/21/22   [name], MD  atorvastatin (LIPITOR) 20 MG tablet TAKE 1 TABLET BY MOUTH EVERY DAY 10/01/21   [name], MD  bacitracin ointment Apply 1 application. topically 2 (two) times daily. Patient not taking: Reported on 04/08/2022 08/22/21   Sherwood Gambler, MD  donepezil (ARICEPT) 10 MG tablet Take 1 tablet (10 mg total) by mouth at bedtime. 05/23/21   [name], MD  fluticasone (FLONASE) 50 MCG/ACT nasal spray Place 1 spray into both nostrils daily. 04/08/22   Wendie Agreste, MD  hydrocortisone cream 1 % Apply to affected area 2 times daily Patient not taking: Reported on 04/08/2022 09/29/21   Hans Eden, NP  ipratropium  (ATROVENT) 0.06 % nasal spray Place 1-2 sprays into both nostrils 4 (four) times daily. As needed for nasal congestion 04/08/22   Wendie Agreste, MD  levothyroxine (SYNTHROID) 25 MCG tablet TAKE 1 TABLET BY MOUTH EVERY DAY BEFORE BREAKFAST 12/06/21   [name], MD  loratadine (CLARITIN) 10 MG tablet Take 1 tablet (10 mg total) by mouth daily. 02/25/22   [name], MD  Multiple Vitamins-Minerals (MULTIVITAMIN WITH MINERALS) tablet Take 1 tablet by mouth daily.    [provider]  omeprazole (PRILOSEC) 40 MG capsule TAKE 1 CAPSULE BY MOUTH DAILY. 04/01/22   [name], MD  OVER THE COUNTER MEDICATION Take 1 capsule by mouth daily. Brain Food Patient not taking: Reported on 04/08/2022    [provider]  sertraline (ZOLOFT) 100 MG tablet TAKE 1 TABLET BY MOUTH EVERY DAY 04/08/22   [name], MD  trimethoprim-polymyxin b (POLYTRIM) ophthalmic solution Place 1 drop into both eyes 2 (two) times daily. Patient not taking: Reported on 04/08/2022 09/29/21   Hans Eden, NP     Allergies    Sulfa antibiotics and Sulfonamide derivatives   Review of Systems   Review of Systems Please see HPI for pertinent positives and negatives  Physical Exam BP 119/70   Pulse 92   Temp 97.9 F (36.6 C) (Oral)   Resp (!) 22   Wt 79 kg   SpO2 98%   BMI 27.28 kg/m   Physical Exam  Vitals and nursing note reviewed.  Constitutional:      Appearance: Normal appearance.  HENT:     Head: Normocephalic and atraumatic.     Nose: Nose normal.     Mouth/Throat:     Mouth: Mucous membranes are moist.  Eyes:     Extraocular Movements: Extraocular movements intact.     Conjunctiva/sclera: Conjunctivae normal.  Cardiovascular:     Rate and Rhythm: Normal rate.  Pulmonary:     Effort: Pulmonary effort is normal.     Breath sounds: Normal breath sounds.  Abdominal:     General: Abdomen is flat.     Palpations: Abdomen is soft.     Tenderness: There  is no abdominal tenderness. There is no guarding. Negative signs include Murphy's sign.  Musculoskeletal:        General: No swelling. Normal range of motion.     Cervical back: Neck supple.     Comments: Dialysis access in RUE with palpable thrill  Skin:    General: Skin is warm and dry.  Neurological:     General: No focal deficit present.     Mental Status: He is alert. He is disoriented.     Cranial Nerves: No cranial nerve deficit.     Sensory: No sensory deficit.     Motor: No weakness.  Psychiatric:        Mood and Affect: Mood normal.     ED Results / Procedures / Treatments   EKG EKG Interpretation  Date/Time:  Tuesday April 16 2022 03:25:20 EDT Ventricular Rate:  98 PR Interval:  244 QRS Duration: 102 QT Interval:  512 QTC Calculation: 653 R Axis:   116 Text Interpretation:   Critical Test Result: Long QTc Sinus rhythm with 1st degree A-V block Right atrial enlargement Left posterior fascicular block Possible Anterior infarct , age undetermined Prolonged QT Abnormal ECG When compared with ECG of 04-Oct-2021 19:29,  Rate faster  QT has lengthened Confirmed by Calvert Cantor [phone] on 04/16/2022 4:53:56 AM  Procedures Procedures  Medications Ordered in the ED Medications  doxycycline (VIBRA-TABS) tablet 100 mg (has no administration in time range)  iohexol (OMNIPAQUE) 300 MG/ML solution 100 mL (75 mLs Intravenous Contrast Given 04/16/22 0542)    Initial Impression and Plan  Patient here primarily for abdominal pain with N/V/D at home, seems to have improved since arrival here, has also had some cough in the last couple of weeks. Denies any pain or fever now. Resting comfortably in bed. Labs done in triage show CBC with anemia, about at baseline. CMP with CKD, no other concerning findings. I personally viewed the images from radiology studies and agree with radiologist interpretation: CXR with basilar infiltrate vs atelectasis, given his cough will need  treatment for infection. His abdomen is benign now, however given his numerous comorbidity and mild confusion, will send for CT to rule out any acute process.   ED Course   Clinical Course as of 04/16/22 0719  Tue Apr 16, 2022  0716 I personally viewed the images from radiology studies and agree with radiologist interpretation: CT neg for acute surgical process. Fluid levels consistent with history of diarrhea. Otherwise he looks well, no distress and no vomiting in the ED. Plan discharge with Rx for doxycycline for his cough/infiltrates. Recommend close outpatient follow up with PCP and proceed with dialysis today as scheduled.   [CS]    Clinical Course User Index [CS] Truddie Hidden, MD     MDM Rules/Calculators/A&P Medical Decision  Making Problems Addressed: Community acquired pneumonia, unspecified laterality: acute illness or injury ESRD on hemodialysis [facility]): chronic illness or injury with exacerbation, progression, or side effects of treatment Gastroenteritis: acute illness or injury  Amount and/or Complexity of Data Reviewed Labs: ordered. Radiology: ordered.  Risk Prescription drug management.    Final Clinical Impression(s) / ED Diagnoses Final diagnoses:  Gastroenteritis  Community acquired pneumonia, unspecified laterality  ESRD on hemodialysis ([place])    Rx / DC Orders ED Discharge Orders          Ordered    doxycycline (VIBRAMYCIN) 100 MG capsule  2 times daily        04/16/22 0719             Truddie Hidden, MD 04/16/22 [phone]

## 2022-04-18 DIAGNOSIS — N186 End stage renal disease: Secondary | ICD-10-CM | POA: Diagnosis not present

## 2022-04-18 DIAGNOSIS — Z992 Dependence on renal dialysis: Secondary | ICD-10-CM | POA: Diagnosis not present

## 2022-04-18 DIAGNOSIS — D689 Coagulation defect, unspecified: Secondary | ICD-10-CM | POA: Diagnosis not present

## 2022-04-18 DIAGNOSIS — N2581 Secondary hyperparathyroidism of renal origin: Secondary | ICD-10-CM | POA: Diagnosis not present

## 2022-04-20 DIAGNOSIS — D689 Coagulation defect, unspecified: Secondary | ICD-10-CM | POA: Diagnosis not present

## 2022-04-20 DIAGNOSIS — Z992 Dependence on renal dialysis: Secondary | ICD-10-CM | POA: Diagnosis not present

## 2022-04-20 DIAGNOSIS — N2581 Secondary hyperparathyroidism of renal origin: Secondary | ICD-10-CM | POA: Diagnosis not present

## 2022-04-20 DIAGNOSIS — N186 End stage renal disease: Secondary | ICD-10-CM | POA: Diagnosis not present

## 2022-04-22 ENCOUNTER — Encounter: Payer: Self-pay | Admitting: Family Medicine

## 2022-04-22 ENCOUNTER — Ambulatory Visit (INDEPENDENT_AMBULATORY_CARE_PROVIDER_SITE_OTHER): Payer: Medicare HMO | Admitting: Family Medicine

## 2022-04-22 ENCOUNTER — Telehealth: Payer: Self-pay | Admitting: Family Medicine

## 2022-04-22 ENCOUNTER — Ambulatory Visit (INDEPENDENT_AMBULATORY_CARE_PROVIDER_SITE_OTHER)
Admission: RE | Admit: 2022-04-22 | Discharge: 2022-04-22 | Disposition: A | Discharge status: Home / Self Care | Payer: Medicare HMO | Source: Ambulatory Visit | Attending: Family Medicine | Admitting: Family Medicine

## 2022-04-22 VITALS — BP 122/60 | HR 99 | Temp 97.6°F | Resp 18 | Ht 67.0 in | Wt 182.5 lb

## 2022-04-22 DIAGNOSIS — I5022 Chronic systolic (congestive) heart failure: Secondary | ICD-10-CM

## 2022-04-22 DIAGNOSIS — R600 Localized edema: Secondary | ICD-10-CM

## 2022-04-22 DIAGNOSIS — J189 Pneumonia, unspecified organism: Secondary | ICD-10-CM | POA: Diagnosis not present

## 2022-04-22 DIAGNOSIS — R062 Wheezing: Secondary | ICD-10-CM | POA: Diagnosis not present

## 2022-04-22 DIAGNOSIS — R0989 Other specified symptoms and signs involving the circulatory and respiratory systems: Secondary | ICD-10-CM

## 2022-04-22 DIAGNOSIS — R059 Cough, unspecified: Secondary | ICD-10-CM | POA: Diagnosis not present

## 2022-04-22 LAB — BASIC METABOLIC PANEL
BUN: 45 mg/dL — ABNORMAL HIGH (ref 6–23)
CO2: 25 mEq/L (ref 19–32)
Calcium: 8.6 mg/dL (ref 8.4–10.5)
Chloride: 95 mEq/L — ABNORMAL LOW (ref 96–112)
Creatinine, Ser: 6.39 mg/dL (ref 0.40–1.50)
GFR: 7.71 mL/min — CL (ref >60.00)
Glucose, Bld: 161 mg/dL — ABNORMAL HIGH (ref 70–99)
Potassium: 4 mEq/L (ref 3.5–5.1)
Sodium: 136 mEq/L (ref 135–145)

## 2022-04-22 LAB — CBC WITH DIFFERENTIAL/PLATELET
Basophils Absolute: 0 10*3/uL (ref 0.0–0.1)
Basophils Relative: 0.7 % (ref 0.0–3.0)
Eosinophils Absolute: 0.2 10*3/uL (ref 0.0–0.7)
Eosinophils Relative: 4.1 % (ref 0.0–5.0)
HCT: 32.7 % — ABNORMAL LOW (ref 39.0–52.0)
Hemoglobin: 10.5 g/dL — ABNORMAL LOW (ref 13.0–17.0)
Lymphocytes Relative: 20.2 % (ref 12.0–46.0)
Lymphs Abs: 1.2 10*3/uL (ref 0.7–4.0)
MCHC: 32.2 g/dL (ref 30.0–36.0)
MCV: 89.3 fl (ref 78.0–100.0)
Monocytes Absolute: 0.5 10*3/uL (ref 0.1–1.0)
Monocytes Relative: 8.1 % (ref 3.0–12.0)
Neutro Abs: 4 10*3/uL (ref 1.4–7.7)
Neutrophils Relative %: 66.9 % (ref 43.0–77.0)
Platelets: 129 10*3/uL — ABNORMAL LOW (ref 150.0–400.0)
RBC: 3.66 Mil/uL — ABNORMAL LOW (ref 4.22–5.81)
RDW: 18.7 % — ABNORMAL HIGH (ref 11.5–15.5)
WBC: 6 10*3/uL (ref 4.0–10.5)

## 2022-04-22 LAB — BRAIN NATRIURETIC PEPTIDE: Pro B Natriuretic peptide (BNP): 3685 pg/mL — ABNORMAL HIGH (ref 0.0–100.0)

## 2022-04-22 LAB — TSH: TSH: 7.64 u[IU]/mL — ABNORMAL HIGH (ref 0.35–5.50)

## 2022-04-22 NOTE — Telephone Encounter (Signed)
Received call from [facility] Lab critical lab Creatine 6.39 and GSR 7.71

## 2022-04-22 NOTE — Progress Notes (Signed)
   Subjective:    Patient ID: Tyler Hahn, male    DOB: 1940-09-27, 81 y.o.   MRN: [id number]  HPI ER f/u- pt was seen in ER on 10/31 w/ productive cough and vomiting and dx'd w/ PNA.  Tx'd w/ Doxycycline x10 days and told to f/u w/ PCP.  Care giver reports ongoing wet cough at night.  Some wheezing.  Pt reports intermittent SOB.  No recent fevers.  Vomiting has resolved.  + LE edema.  Has not missed any HD sessions.   Review of Systems For ROS see HPI     Objective:   Physical Exam Vitals reviewed.  Constitutional:      General: He is not in acute distress.    Appearance: Normal appearance. He is not ill-appearing.  HENT:     Head: Normocephalic and atraumatic.  Cardiovascular:     Rate and Rhythm: Normal rate and regular rhythm.  Pulmonary:     Effort: No respiratory distress.     Breath sounds: Rhonchi (bilateral crackles from bases to mid-lung) present.  Musculoskeletal:     Cervical back: Neck supple.     Right lower leg: Edema (1-2+) present.     Left lower leg: Edema (1-2+) present.  Lymphadenopathy:     Cervical: No cervical adenopathy.  Skin:    General: Skin is warm and dry.  Neurological:     Mental Status: He is alert. Mental status is at baseline.  Psychiatric:        Mood and Affect: Mood normal.        Behavior: Behavior normal.        Thought Content: Thought content normal.           Assessment & Plan:  Bibasilar crackles/localized edema- new.  Pt recently dx'd w/ PNA on 10/31 but pt's clinical picture today is more consistent w/ volume overload.  Will get BNP, CXR, and refer to Cards for urgent evaluation.  No recent ECHO on file.  Will start Lasix but given his dependence on HD, not sure if this will be beneficial or not.  Will make Cards and Nephrology aware of current situation.  Caregiver expressed understanding and agreement.  RLL PNA- new.  Per xray this was infxn vs atelectasis.  Finishing course of Doxy but continues to have wet cough,  wheezing, and SOB.  Suspect this is cardiac related and due to volume overload- see above.  Pt to complete Doxy as directed.

## 2022-04-22 NOTE — Patient Instructions (Signed)
Please go to [facility] and head to the basement for your chest xray We'll notify you of your lab results and make any changes if needed START the Furosemide to help w/ swelling (although I'm not sure how much of a response we'll get given his kidneys) FINISH the Doxycycline as directed Call with any questions or concerns Hang in there!!

## 2022-04-23 ENCOUNTER — Other Ambulatory Visit: Payer: Self-pay

## 2022-04-23 ENCOUNTER — Telehealth: Payer: Self-pay

## 2022-04-23 ENCOUNTER — Other Ambulatory Visit: Payer: Self-pay | Admitting: Family Medicine

## 2022-04-23 DIAGNOSIS — Z992 Dependence on renal dialysis: Secondary | ICD-10-CM | POA: Diagnosis not present

## 2022-04-23 DIAGNOSIS — D689 Coagulation defect, unspecified: Secondary | ICD-10-CM | POA: Diagnosis not present

## 2022-04-23 DIAGNOSIS — E039 Hypothyroidism, unspecified: Secondary | ICD-10-CM

## 2022-04-23 DIAGNOSIS — N186 End stage renal disease: Secondary | ICD-10-CM | POA: Diagnosis not present

## 2022-04-23 DIAGNOSIS — N2581 Secondary hyperparathyroidism of renal origin: Secondary | ICD-10-CM | POA: Diagnosis not present

## 2022-04-23 MED ORDER — LEVOTHYROXINE SODIUM 50 MCG PO TABS: 50.0000 ug | ORAL_TABLET | Freq: Every day | ORAL | 3 refills | Status: DC

## 2022-04-23 MED ORDER — FUROSEMIDE 20 MG PO TABS: 20.0000 mg | ORAL_TABLET | Freq: Every day | ORAL | 3 refills | Status: DC

## 2022-04-23 NOTE — Telephone Encounter (Signed)
Spoke with the pt son [name] and advised of lab results . Pt is coming in on Nov 15 for repeat TSH and his Levothyroxine 50 mg has been sent in . He expressed verbal understanding

## 2022-04-23 NOTE — Progress Notes (Signed)
Prescription sent to preferred pharmacy

## 2022-04-23 NOTE — Telephone Encounter (Signed)
Left pt a VM to call office in regards to his lab results . His repeat TSH order is in pt needs an lab only visit for one month and his Levothyroxine 50 mcg has been sent to pharmacy as well

## 2022-04-23 NOTE — Telephone Encounter (Signed)
Pt son(Jonathan) called back to get lab results for his dad. Call son on [phone].

## 2022-04-23 NOTE — Telephone Encounter (Signed)
-----   Message from [name], MD sent at 04/23/2022  7:26 AM EST ----- TSH is elevated at 7.64 which means your dose of Levothyroxine (thyroid medication) is too low.  We will increase this to 50mg daily (#30, 3 refills) and repeat your TSH level in 1 month  Your BNP (a marker of strain on the heart that is elevated when you are volume overloaded) is VERY high.  This is likely the cause of your wet cough and wheezing.  We started the Furosemide (Lasix) to help with the swelling but I'm not sure how effective this will be since you are on dialysis.  Cardiology and Nephrology will help uKoreamanage this fluid balance

## 2022-04-23 NOTE — Telephone Encounter (Signed)
Noted.  Pt is on dialysis so this is expected.

## 2022-04-24 ENCOUNTER — Other Ambulatory Visit: Payer: Self-pay

## 2022-04-24 MED ORDER — ATORVASTATIN CALCIUM 20 MG PO TABS: 20.0000 mg | ORAL_TABLET | Freq: Every day | ORAL | 3 refills | Status: DC

## 2022-04-25 ENCOUNTER — Ambulatory Visit: Payer: Medicare HMO | Admitting: Physician Assistant

## 2022-04-25 ENCOUNTER — Telehealth: Payer: Self-pay

## 2022-04-25 DIAGNOSIS — Z992 Dependence on renal dialysis: Secondary | ICD-10-CM | POA: Diagnosis not present

## 2022-04-25 DIAGNOSIS — N186 End stage renal disease: Secondary | ICD-10-CM | POA: Diagnosis not present

## 2022-04-25 DIAGNOSIS — D689 Coagulation defect, unspecified: Secondary | ICD-10-CM | POA: Diagnosis not present

## 2022-04-25 DIAGNOSIS — N2581 Secondary hyperparathyroidism of renal origin: Secondary | ICD-10-CM | POA: Diagnosis not present

## 2022-04-25 NOTE — Telephone Encounter (Signed)
Left pt  son VM that pneumonia has improved

## 2022-04-25 NOTE — Telephone Encounter (Signed)
-----   Message from [name], MD sent at 04/25/2022  7:25 AM EST ----- Pneumonia has improved.  This is great news!

## 2022-04-25 NOTE — Progress Notes (Signed)
Office Visit    Patient Name: Tyler Hahn Date of Encounter: 04/26/2022  PCP:  [name], MD   [place]  Cardiologist:  None  Advanced Practice Provider:  No care team member to display Electrophysiologist:  Virl Axe, MD      Chief Complaint    Tyler Hahn is a 81 y.o. male presents today for ED follow up   Past Medical History    Past Medical History:  Diagnosis Date   Anxiety    Automatic implantable cardioverter-defibrillator in situ    greg taylor   CHF (congestive heart failure) ([employer])    2000   Diabetes mellitus    no meds   Fatty liver    Gout    "bout 2-3 months ago"-meds helped.   Hyperlipidemia    Hypertension    Hyperthyroidism    Kidney cysts    PONV (postoperative nausea and vomiting)    Renal insufficiency    Past Surgical History:  Procedure Laterality Date   AV FISTULA PLACEMENT Right 08/23/2019   Procedure: ARTERIOVENOUS GORTEX GRAFT RIGHT ARM;  Surgeon: Rosetta Posner, MD;  Location: [facility] OR;  Service: Vascular;  Laterality: Right;   CARDIAC DEFIBRILLATOR PLACEMENT     catract     COLONOSCOPY WITH PROPOFOL N/A 10/03/2015   Procedure: COLONOSCOPY WITH PROPOFOL;  Surgeon: Ladene Artist, MD;  Location: [facility] ENDOSCOPY;  Service: Endoscopy;  Laterality: N/A;   DIAGNOSTIC LAPAROSCOPY  01/27/2014   Dr Brantley Stage   ENTEROSCOPY N/A 11/25/2013   Procedure: ENTEROSCOPY;  Surgeon: Ladene Artist, MD;  Location: [facility] ENDOSCOPY;  Service: Endoscopy;  Laterality: N/A;   ICD GENERATOR CHANGEOUT N/A 11/06/2020   Procedure: ICD GENERATOR CHANGEOUT;  Surgeon: [name], MD;  Location: [facility] LAB;  Service: Cardiovascular;  Laterality: N/A;   ICD,[place] [employer]     LAPAROSCOPY N/A 01/27/2014   Procedure: LAPAROSCOPY DIAGNOSTIC;  Surgeon: Joyice Faster. Cornett, MD;  Location: [facility] OR;  Service: General;  Laterality: N/A;   polyp removal throat     difficulty speaking    Allergies  Allergies  Allergen  Reactions   Sulfa Antibiotics Itching and Rash   Sulfonamide Derivatives Itching and Rash    History of Present Illness    Tyler Hahn is a 81 y.o. male with a hx of ESRD on HD, HFrEF/NICM s/p ICD (2009, generator change 10/2020), VT/PMVT, HTN, HLD, hypothyroidism, dementia, GERD last seen 03/01/22 by Oda Kilts, PA.  CHF initially diagnosed in 2000. Nuclear myoview with no evidence of ischemia nor infarction.  Most recent echo 2014 with LVEF 20-25%. Guideline directed medical therapy of his HF has been limited by ESRD and hypotension as intolerant of even low dose Carvedilol.  Previously had VT during dialysis in April. Felt to be TDp and has resolved with potassium supplementation with K bath. Per EP only AAD option would be Amiodarone.   ED visit 04/16/2022 due to productive cough and vomiting treated with  doxycycline for cough/infiltrates.  Saw primary care in follow-up 04/22/2022 with persistent wet cough, wheezing, shortness of breath.  He was referred for evaluation.  Primary care also started him on Lasix.  Labs 04/22/2022 BNP 3695, TSH 7.64 and levothyroxine adjusted, CBC hemoglobin 10.5.  He presents today for follow up accompanied by his son, [name]. Patient reports he is feeling much improved from his ED visit at the end of October. He is no longer coughing as much and denies shortness of breath.  He reports the usual amount of lower extremity edema, left greater than right, and son confirms. He has a mild amount of orthopnea that son notices more than patient. [name] reports he will notice him sounding a little wet or coughing a bit at night so he will prop him up with one pillow and it resolves. Patient reports he has no awareness of this. He was started on Lasix 20 mg by his PCP on 04/22/2022. He still makes urine and felt he had brisk urination initially but it has slowed down since. [name] is unsure if this is accurate. They have no questions or concerns today.    EKGs/Labs/Other Studies Reviewed:   The following studies were reviewed today:  Echo 09/17/12 - Left ventricle: The cavity size was mildly dilated. Wall    thickness was normal. Systolic function was severely    reduced. The estimated ejection fraction was in the range    of 20% to 25%. Diffuse hypokinesis. Doppler parameters are    consistent with abnormal left ventricular relaxation    (grade 1 diastolic dysfunction).  - Aortic valve: There was no stenosis.  - Mitral valve: Mildly calcified annulus. Trivial    regurgitation.  - Left atrium: The atrium was mildly dilated.  - Right ventricle: The cavity size was normal. Pacer wire or    catheter noted in right ventricle. Systolic function was    mildly reduced.  - Pulmonary arteries: No complete TR doppler jet so unable    to estimate PA systolic pressure.  - Inferior vena cava: The vessel was normal in size; the    respirophasic diameter changes were in the normal range (=    50%); findings are consistent with normal central venous    pressure.  Impressions:   - Mildly dilated left ventricle with severe global    hypokinesis, EF 20-25%. Normal RV size with mildly    decreased systolic function. No significant valvular    abnormalities.  EKG:  EKG is not ordered today.   Recent Labs: 04/16/2022: ALT 35 04/22/2022: BUN 45; Creatinine, Ser 6.39; Hemoglobin 10.5; Platelets 129.0; Potassium 4.0; Pro B Natriuretic peptide (BNP) 3,685.0; Sodium 136; TSH 7.64  Recent Lipid Panel    Component Value Date/Time   CHOL 145 02/25/2022 1051   TRIG 200.0 (H) 02/25/2022 1051   HDL 39.20 02/25/2022 1051   CHOLHDL 4 02/25/2022 1051   VLDL 40.0 02/25/2022 1051   LDLCALC 66 02/25/2022 1051   LDLCALC 83 07/04/2017 1555   LDLDIRECT 94.0 08/03/2021 1312    Home Medications   Current Meds  Medication Sig   allopurinol (ZYLOPRIM) 100 MG tablet TAKE 1 TABLET BY MOUTH 3 TIMES A WEEK ON MONDAY, WEDNESDAY AND FRIDAY   atorvastatin (LIPITOR)  20 MG tablet Take 1 tablet (20 mg total) by mouth daily.   donepezil (ARICEPT) 10 MG tablet Take 1 tablet (10 mg total) by mouth at bedtime.   doxycycline (VIBRAMYCIN) 100 MG capsule Take 1 capsule (100 mg total) by mouth 2 (two) times daily.   fluticasone (FLONASE) 50 MCG/ACT nasal spray Place 1 spray into both nostrils daily.   furosemide (LASIX) 20 MG tablet Take 1 tablet (20 mg total) by mouth daily.   hydrocortisone cream 1 % Apply to affected area 2 times daily   ipratropium (ATROVENT) 0.06 % nasal spray Place 1-2 sprays into both nostrils 4 (four) times daily. As needed for nasal congestion   levothyroxine (SYNTHROID) 50 MCG tablet Take 1 tablet (50 mcg total) by mouth  daily.   loratadine (CLARITIN) 10 MG tablet Take 1 tablet (10 mg total) by mouth daily.   memantine (NAMENDA) 10 MG tablet Take 10 mg by mouth 2 (two) times daily.   Multiple Vitamins-Minerals (MULTIVITAMIN WITH MINERALS) tablet Take 1 tablet by mouth daily.   omeprazole (PRILOSEC) 40 MG capsule TAKE 1 CAPSULE BY MOUTH DAILY.   ondansetron (ZOFRAN-ODT) 4 MG disintegrating tablet '4mg'$  ODT q4 hours prn nausea/vomit   OVER THE COUNTER MEDICATION Take 1 capsule by mouth daily. Brain Food   sertraline (ZOLOFT) 100 MG tablet TAKE 1 TABLET BY MOUTH EVERY DAY     Review of Systems   Positive for mild nighttime cough and mild lower extremity edema, L>R (usual for him).    All other systems reviewed and are otherwise negative except as noted above.  Physical Exam    VS:  BP 107/71   Pulse 98   Ht '5\' 7"'$  (1.702 m)   Wt 177 lb 9.6 oz (80.6 kg)   BMI 27.82 kg/m  , BMI Body mass index is 27.82 kg/m.  Wt Readings from Last 3 Encounters:  04/26/22 177 lb 9.6 oz (80.6 kg)  04/22/22 182 lb 8 oz (82.8 kg)  04/16/22 174 lb 2.6 oz (79 kg)    GEN: Well nourished, well developed, in no acute distress. HEENT: normal. Neck: Supple, no JVD, carotid bruits, or masses. Cardiac: RRR, no murmurs, rubs, or gallops. No clubbing, cyanosis.   1+ nonpitting edema, left greater than right, lower extremities. Radials/PT 2+ and equal bilaterally.  Respiratory:  Respirations regular and unlabored, clear to auscultation bilaterally. GI: Soft, nontender, nondistended. MS: No deformity or atrophy. Skin: Warm and dry, no rash. Neuro:  Strength and sensation are intact. Psych: Normal affect.  Assessment & Plan    HFrEF / Elevated BNP - 2014 LVEF 20-25%. Volume management per HD. He appears euvolemic today. Lungs are clear and he is not coughing or complaining of shortness of breath. Discussed that despite echo not being performed since 2014 repeat echo would not change current treatment. Patient and son are in agreement to forego repeat testing at this time. Low sodium diet encouraged. He was started on Lasix by his PCP. He reports increased urination initially but patient's son is unsure if this is accurate. Recommend follow-up with nephrology to determine if Lasix should be continued. GDMT limited by ESRD and hypotension.    VT / s/p ICD - Follows with EP Dr. Caryl Comes.  Patient denies palpitations, dizziness, or syncope.  Last clinic device check 03/01/2022 showed normal device function, no VT since April 2023.  HLD - 02/2022 LDL 66. Continue Atorvastatin. Last lipid panel 02/25/2022 should LDL 66, at goal. Triglycerides elevated. Continue to follow with PCP.  ESRD on HD - Patient on TuThSa schedule. He is encouraged to follow-up with his nephrologist, as he has not been seen in quite some time according to patient's son.   Hypothyroidism - Levothyroxine dose recently adjusted. Continue to follow with PCP. He is scheduled to see PCP next week to reevaluate medication changes.          Disposition: Follow up in 6 month(s) with Dr. Caryl Comes or EP APP.  Signed, Mayra Reel, NP 04/26/2022, 8:14 AM [place]

## 2022-04-26 ENCOUNTER — Encounter (HOSPITAL_BASED_OUTPATIENT_CLINIC_OR_DEPARTMENT_OTHER): Payer: Self-pay | Admitting: Family

## 2022-04-26 ENCOUNTER — Ambulatory Visit (INDEPENDENT_AMBULATORY_CARE_PROVIDER_SITE_OTHER): Payer: Medicare HMO | Admitting: Student

## 2022-04-26 VITALS — BP 107/71 | HR 98 | Ht 67.0 in | Wt 177.6 lb

## 2022-04-26 DIAGNOSIS — I5022 Chronic systolic (congestive) heart failure: Secondary | ICD-10-CM | POA: Diagnosis not present

## 2022-04-26 NOTE — Patient Instructions (Signed)
Medication Instructions:  Continue your current medications.   Recommend talking with nephrology about Lasix.   Management of volume will be at dialysis.   *If you need a refill on your cardiac medications before your next appointment, please call your pharmacy*   Lab Work/Testing/Procedures: None ordered today.    Follow-Up: At [facility], you and your health needs are our priority.  As part of our continuing mission to provide you with exceptional heart care, we have created designated Provider Care Teams.  These Care Teams include your primary Cardiologist (physician) and Advanced Practice Providers (APPs -  Physician Assistants and Nurse Practitioners) who all work together to provide you with the care you need, when you need it.  We recommend signing up for the patient portal called "MyChart".  Sign up information is provided on this After Visit Summary.  MyChart is used to connect with patients for Virtual Visits (Telemedicine).  Patients are able to view lab/test results, encounter notes, upcoming appointments, etc.  Non-urgent messages can be sent to your provider as well.   To learn more about what you can do with MyChart, go to [email or web address].    Your next appointment:   6 month(s)  The format for your next appointment:   In Person  Provider:   Dr. Caryl Comes   Other Instructions  To prevent or reduce lower extremity swelling: Eat a low salt diet. Salt makes the body hold onto extra fluid which causes swelling. Sit with legs elevated. For example, in the recliner or on an [place].  Wear knee-high compression stockings during the daytime. Ones labeled 15-20 mmHg provide good compression.  Heart Healthy Diet Recommendations: A low-salt diet is recommended. Meats should be grilled, baked, or boiled. Avoid fried foods. Focus on lean protein sources like fish or chicken with vegetables and fruits. The American Heart Association is a Microbiologist!  American  Heart Association Diet and Lifeystyle Recommendations

## 2022-04-27 DIAGNOSIS — N2581 Secondary hyperparathyroidism of renal origin: Secondary | ICD-10-CM | POA: Diagnosis not present

## 2022-04-27 DIAGNOSIS — Z992 Dependence on renal dialysis: Secondary | ICD-10-CM | POA: Diagnosis not present

## 2022-04-27 DIAGNOSIS — D689 Coagulation defect, unspecified: Secondary | ICD-10-CM | POA: Diagnosis not present

## 2022-04-27 DIAGNOSIS — N186 End stage renal disease: Secondary | ICD-10-CM | POA: Diagnosis not present

## 2022-04-30 DIAGNOSIS — D631 Anemia in chronic kidney disease: Secondary | ICD-10-CM | POA: Diagnosis not present

## 2022-04-30 DIAGNOSIS — N186 End stage renal disease: Secondary | ICD-10-CM | POA: Diagnosis not present

## 2022-04-30 DIAGNOSIS — Z992 Dependence on renal dialysis: Secondary | ICD-10-CM | POA: Diagnosis not present

## 2022-04-30 DIAGNOSIS — D689 Coagulation defect, unspecified: Secondary | ICD-10-CM | POA: Diagnosis not present

## 2022-04-30 DIAGNOSIS — N2581 Secondary hyperparathyroidism of renal origin: Secondary | ICD-10-CM | POA: Diagnosis not present

## 2022-05-01 ENCOUNTER — Other Ambulatory Visit (INDEPENDENT_AMBULATORY_CARE_PROVIDER_SITE_OTHER): Payer: Medicare HMO

## 2022-05-01 ENCOUNTER — Telehealth: Payer: Self-pay | Admitting: *Deleted

## 2022-05-01 DIAGNOSIS — E039 Hypothyroidism, unspecified: Secondary | ICD-10-CM | POA: Diagnosis not present

## 2022-05-01 LAB — TSH: TSH: 5.78 u[IU]/mL — ABNORMAL HIGH (ref 0.35–5.50)

## 2022-05-01 NOTE — Telephone Encounter (Signed)
        Patient  visited [facility] on 04/16/2022  for Treatment    Telephone encounter attempt :  1st  A HIPAA compliant voice message was left requesting a return call.  Instructed patient to call back at [phone].  [place] [phone] [address] , [place] [address] Email : [email or web address]

## 2022-05-02 DIAGNOSIS — D631 Anemia in chronic kidney disease: Secondary | ICD-10-CM | POA: Diagnosis not present

## 2022-05-02 DIAGNOSIS — D689 Coagulation defect, unspecified: Secondary | ICD-10-CM | POA: Diagnosis not present

## 2022-05-02 DIAGNOSIS — Z992 Dependence on renal dialysis: Secondary | ICD-10-CM | POA: Diagnosis not present

## 2022-05-02 DIAGNOSIS — N186 End stage renal disease: Secondary | ICD-10-CM | POA: Diagnosis not present

## 2022-05-02 DIAGNOSIS — N2581 Secondary hyperparathyroidism of renal origin: Secondary | ICD-10-CM | POA: Diagnosis not present

## 2022-05-04 DIAGNOSIS — D689 Coagulation defect, unspecified: Secondary | ICD-10-CM | POA: Diagnosis not present

## 2022-05-04 DIAGNOSIS — Z992 Dependence on renal dialysis: Secondary | ICD-10-CM | POA: Diagnosis not present

## 2022-05-04 DIAGNOSIS — N186 End stage renal disease: Secondary | ICD-10-CM | POA: Diagnosis not present

## 2022-05-04 DIAGNOSIS — N2581 Secondary hyperparathyroidism of renal origin: Secondary | ICD-10-CM | POA: Diagnosis not present

## 2022-05-04 DIAGNOSIS — D631 Anemia in chronic kidney disease: Secondary | ICD-10-CM | POA: Diagnosis not present

## 2022-05-06 DIAGNOSIS — D689 Coagulation defect, unspecified: Secondary | ICD-10-CM | POA: Diagnosis not present

## 2022-05-06 DIAGNOSIS — N186 End stage renal disease: Secondary | ICD-10-CM | POA: Diagnosis not present

## 2022-05-06 DIAGNOSIS — N2581 Secondary hyperparathyroidism of renal origin: Secondary | ICD-10-CM | POA: Diagnosis not present

## 2022-05-06 DIAGNOSIS — Z992 Dependence on renal dialysis: Secondary | ICD-10-CM | POA: Diagnosis not present

## 2022-05-08 DIAGNOSIS — D689 Coagulation defect, unspecified: Secondary | ICD-10-CM | POA: Diagnosis not present

## 2022-05-08 DIAGNOSIS — N186 End stage renal disease: Secondary | ICD-10-CM | POA: Diagnosis not present

## 2022-05-08 DIAGNOSIS — N2581 Secondary hyperparathyroidism of renal origin: Secondary | ICD-10-CM | POA: Diagnosis not present

## 2022-05-08 DIAGNOSIS — Z992 Dependence on renal dialysis: Secondary | ICD-10-CM | POA: Diagnosis not present

## 2022-05-11 DIAGNOSIS — Z992 Dependence on renal dialysis: Secondary | ICD-10-CM | POA: Diagnosis not present

## 2022-05-11 DIAGNOSIS — D689 Coagulation defect, unspecified: Secondary | ICD-10-CM | POA: Diagnosis not present

## 2022-05-11 DIAGNOSIS — N186 End stage renal disease: Secondary | ICD-10-CM | POA: Diagnosis not present

## 2022-05-11 DIAGNOSIS — N2581 Secondary hyperparathyroidism of renal origin: Secondary | ICD-10-CM | POA: Diagnosis not present

## 2022-05-13 ENCOUNTER — Telehealth: Payer: Self-pay

## 2022-05-13 NOTE — Telephone Encounter (Signed)
error 

## 2022-05-14 DIAGNOSIS — Z992 Dependence on renal dialysis: Secondary | ICD-10-CM | POA: Diagnosis not present

## 2022-05-14 DIAGNOSIS — E876 Hypokalemia: Secondary | ICD-10-CM | POA: Diagnosis not present

## 2022-05-14 DIAGNOSIS — N2581 Secondary hyperparathyroidism of renal origin: Secondary | ICD-10-CM | POA: Diagnosis not present

## 2022-05-14 DIAGNOSIS — D631 Anemia in chronic kidney disease: Secondary | ICD-10-CM | POA: Diagnosis not present

## 2022-05-14 DIAGNOSIS — N186 End stage renal disease: Secondary | ICD-10-CM | POA: Diagnosis not present

## 2022-05-14 DIAGNOSIS — D689 Coagulation defect, unspecified: Secondary | ICD-10-CM | POA: Diagnosis not present

## 2022-05-16 ENCOUNTER — Other Ambulatory Visit: Payer: Self-pay | Admitting: Family Medicine

## 2022-05-16 DIAGNOSIS — N186 End stage renal disease: Secondary | ICD-10-CM | POA: Diagnosis not present

## 2022-05-16 DIAGNOSIS — N2581 Secondary hyperparathyroidism of renal origin: Secondary | ICD-10-CM | POA: Diagnosis not present

## 2022-05-16 DIAGNOSIS — E876 Hypokalemia: Secondary | ICD-10-CM | POA: Diagnosis not present

## 2022-05-16 DIAGNOSIS — Z992 Dependence on renal dialysis: Secondary | ICD-10-CM | POA: Diagnosis not present

## 2022-05-16 DIAGNOSIS — I129 Hypertensive chronic kidney disease with stage 1 through stage 4 chronic kidney disease, or unspecified chronic kidney disease: Secondary | ICD-10-CM | POA: Diagnosis not present

## 2022-05-16 DIAGNOSIS — D631 Anemia in chronic kidney disease: Secondary | ICD-10-CM | POA: Diagnosis not present

## 2022-05-16 DIAGNOSIS — D689 Coagulation defect, unspecified: Secondary | ICD-10-CM | POA: Diagnosis not present

## 2022-05-18 DIAGNOSIS — Z992 Dependence on renal dialysis: Secondary | ICD-10-CM | POA: Diagnosis not present

## 2022-05-18 DIAGNOSIS — D689 Coagulation defect, unspecified: Secondary | ICD-10-CM | POA: Diagnosis not present

## 2022-05-18 DIAGNOSIS — N186 End stage renal disease: Secondary | ICD-10-CM | POA: Diagnosis not present

## 2022-05-18 DIAGNOSIS — N2581 Secondary hyperparathyroidism of renal origin: Secondary | ICD-10-CM | POA: Diagnosis not present

## 2022-05-21 DIAGNOSIS — N186 End stage renal disease: Secondary | ICD-10-CM | POA: Diagnosis not present

## 2022-05-21 DIAGNOSIS — N2581 Secondary hyperparathyroidism of renal origin: Secondary | ICD-10-CM | POA: Diagnosis not present

## 2022-05-21 DIAGNOSIS — E876 Hypokalemia: Secondary | ICD-10-CM | POA: Diagnosis not present

## 2022-05-21 DIAGNOSIS — Z992 Dependence on renal dialysis: Secondary | ICD-10-CM | POA: Diagnosis not present

## 2022-05-21 DIAGNOSIS — D689 Coagulation defect, unspecified: Secondary | ICD-10-CM | POA: Diagnosis not present

## 2022-05-22 NOTE — Progress Notes (Signed)
Chronic Care Management Pharmacy Note  05/29/2022 Name:  Tyler Hahn MRN:  [id number] DOB:  27-Dec-1940  Recommendations/Changes made from today's visit:  Patient's son reports wet cough.  He is still taking Lasix to help with fluid.  They are monitoring weights at dialysis.  Possibly fluid build up in lungs causing cough - he denies any SOB.  He is also not taking phosphate binder due to cost.  Fu with nephrology on phosphate binder - consider good rx.  Also please monitor weights and fluid and call us before it gets too bad!!  FU in 6 months CMA in 3 months  Subjective: Tyler Hahn is an 81 y.o. year old male who is a primary patient of Tabori, Aundra Millet, MD.  The CCM team was consulted for assistance with disease management and care coordination needs.    Engaged with patient by telephone for follow up visit in response to provider referral for pharmacy case management and/or care coordination services. Spoke with patient's son, Tyler Hahn.    Consent to Services:  The patient was given information about Chronic Care Management services, agreed to services, and gave verbal consent prior to initiation of services.  Please see initial visit note for detailed documentation.   Patient Care Team: [name], MD as PCP - General [name], MD as PCP - Electrophysiology (Cardiology) [name], MD as Consulting Physician (Cardiology) Renato Shin, MD (Inactive) as Consulting Physician (Endocrinology) Ladene Artist, MD as Consulting Physician (Gastroenterology) Rosita Fire, MD as Consulting Physician (Nephrology) Edythe Clarity, [facility] (Pharmacist)  Recent office visits:  None noted.    Recent consult visits:  None noted.    Hospital visits:  None in previous 6 months  Objective:  Lab Results  Component Value Date   CREATININE 6.39 (HH) 04/22/2022   CREATININE 7.36 (H) 04/16/2022   CREATININE 7.73 (HH) 02/25/2022    Lab  Results  Component Value Date   HGBA1C 5.8 02/25/2022   Last diabetic Eye exam:  Lab Results  Component Value Date/Time   HMDIABEYEEXA No Retinopathy 08/25/2016 12:00 AM    Last diabetic Foot exam: No results found for: "HMDIABFOOTEX"      Component Value Date/Time   CHOL 145 02/25/2022 1051   TRIG 200.0 (H) 02/25/2022 1051   HDL 39.20 02/25/2022 1051   CHOLHDL 4 02/25/2022 1051   VLDL 40.0 02/25/2022 1051   LDLCALC 66 02/25/2022 1051   LDLCALC 83 07/04/2017 1555   LDLDIRECT 94.0 08/03/2021 1312       Latest Ref Rng & Units 04/16/2022    3:13 AM 02/25/2022   10:51 AM 11/26/2021   12:04 PM  Hepatic Function  Total Protein 6.5 - 8.1 g/dL 8.3  8.1  8.3   Albumin 3.5 - 5.0 g/dL 4.2  4.1  4.3   AST 15 - 41 U/L 35  18  21   ALT 0 - 44 U/L 35  17  13   Alk Phosphatase 38 - 126 U/L 81  91  74   Total Bilirubin 0.3 - 1.2 mg/dL 0.5  0.4  0.6   Bilirubin, Direct 0.0 - 0.3 mg/dL  0.1  0.1     Lab Results  Component Value Date/Time   TSH 5.78 (H) 05/01/2022 11:14 AM   TSH 7.64 (H) 04/22/2022 11:14 AM   FREET4 0.91 08/30/2019 04:02 PM   FREET4 0.68 09/18/2015 01:28 PM       Latest Ref Rng & Units 04/22/2022  11:14 AM 04/16/2022    3:13 AM 02/25/2022   10:51 AM  CBC  WBC 4.0 - 10.5 K/uL 6.0  5.6  4.3   Hemoglobin 13.0 - 17.0 g/dL 10.5  10.7  11.4   Hematocrit 39.0 - 52.0 % 32.7  33.0  35.1   Platelets 150.0 - 400.0 K/uL 129.0  129  124.0     Lab Results  Component Value Date/Time   VD25OH 80.44 03/24/2014 03:41 PM   VD25OH 10 (L) 07/07/2013 02:17 PM    Clinical ASCVD:  The ASCVD Risk score (Arnett DK, et al., 2019) failed to calculate for the following reasons:   The 2019 ASCVD risk score is only valid for ages 47 to 39    Other: (CHADS2VASc if Afib, PHQ9 if depression, MMRC or CAT for COPD, ACT, DEXA)  Social History   Tobacco Use  Smoking Status Former   Types: Cigarettes   Quit date: 11/26/1998   Years since quitting: 23.5  Smokeless Tobacco Never   BP  Readings from Last 3 Encounters:  04/26/22 107/71  04/22/22 122/60  04/16/22 121/68   Pulse Readings from Last 3 Encounters:  04/26/22 98  04/22/22 99  04/16/22 91   Wt Readings from Last 3 Encounters:  04/26/22 177 lb 9.6 oz (80.6 kg)  04/22/22 182 lb 8 oz (82.8 kg)  04/16/22 174 lb 2.6 oz (79 kg)    Assessment: Review of patient past medical history, allergies, medications, health status, including review of consultants reports, laboratory and other test data, was performed as part of comprehensive evaluation and provision of chronic care management services.   SDOH:  (Social Determinants of Health) assessments and interventions performed: No SDOH Interventions    Flowsheet Row Clinical Support from 12/26/2021 in [place] Management from 05/05/2020 in [place] Management from 12/22/2019 in [place] Office Visit from 08/21/2018 in [place] [facility] [place] from 02/14/2015 in [place] [facility] [place] Interventions Intervention Not Indicated -- -- -- --  Housing Interventions Intervention Not Indicated -- -- -- --  Transportation Interventions Intervention Not Indicated Other (Comment) Intervention Not Indicated, Other (Comment) -- --  Depression Interventions/Treatment  -- -- -- Medication Medication  Financial Strain Interventions Intervention Not Indicated -- -- -- --  Physical Activity Interventions Intervention Not Indicated -- -- -- --  Stress Interventions Intervention Not Indicated -- -- -- --  Social Connections Interventions Intervention Not Indicated -- -- -- --      Emergency planning/management officer Strain: Low Risk  (12/26/2021)   Overall Financial Resource Strain (CARDIA)    Difficulty of Paying Living Expenses: Not hard at all   Food  Insecurity: No Food Insecurity (12/26/2021)   Hunger Vital Sign    Worried About Running Out of Food in the Last Year: Never true    Ran Out of Food in the Last Year: Never true     CCM Care Plan  Allergies  Allergen Reactions   Sulfa Antibiotics Itching and Rash   Sulfonamide Derivatives Itching and Rash    Medications Reviewed Today     Reviewed by Edythe Clarity, [facility] (Pharmacist) on 05/29/22 at 28  Med List Status: <None>   Medication Order Taking? Sig Documenting Provider Last Dose Status Informant  allopurinol (ZYLOPRIM) 100 MG tablet [id number] Yes TAKE 1 TABLET BY MOUTH 3 TIMES A WEEK  ON MONDAY, [facility] AND FRIDAY [name], MD Taking Active   atorvastatin (LIPITOR) 20 MG tablet [id number] Yes Take 1 tablet (20 mg total) by mouth daily. [name], MD Taking Active   donepezil (ARICEPT) 10 MG tablet [id number] Yes Take 1 tablet (10 mg total) by mouth at bedtime. [name], MD Taking Active   doxycycline (VIBRAMYCIN) 100 MG capsule [id number] Yes Take 1 capsule (100 mg total) by mouth 2 (two) times daily. Orpah Greek, MD Taking Active   fluticasone [facility]) 50 MCG/ACT nasal spray [id number] Yes Place 1 spray into both nostrils daily. Wendie Agreste, MD Taking Active   furosemide (LASIX) 20 MG tablet [id number] Yes Take 1 tablet (20 mg total) by mouth daily. [name], MD Taking Active   hydrocortisone cream 1 % [id number] Yes Apply to affected area 2 times daily Lowella Petties R, NP Taking Active   ipratropium (ATROVENT) 0.06 % nasal spray [id number] Yes Place 1-2 sprays into both nostrils 4 (four) times daily. As needed for nasal congestion Wendie Agreste, MD Taking Active   levothyroxine (SYNTHROID) 50 MCG tablet [id number] Yes Take 1 tablet (50 mcg total) by mouth daily. [name], MD Taking Active   loratadine (CLARITIN) 10 MG tablet [id number] Yes Take 1 tablet (10 mg total) by mouth daily. [name],  MD Taking Active   memantine [facility]) 10 MG tablet [id number] Yes Take 10 mg by mouth 2 (two) times daily. [provider] Taking Active   Multiple Vitamins-Minerals (MULTIVITAMIN WITH MINERALS) tablet [id number] Yes Take 1 tablet by mouth daily. [provider] Taking Active Self  omeprazole (PRILOSEC) 40 MG capsule [id number] Yes TAKE 1 CAPSULE BY MOUTH DAILY. [name], MD Taking Active   ondansetron (ZOFRAN-ODT) 4 MG disintegrating tablet [id number] Yes 51m ODT q4 hours prn nausea/vomit POrpah Greek MD Taking Active   OVER THE COUNTER MEDICATION [id number] Take 1 capsule by mouth daily. 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 03/02/2020   Weakness    ESRD (end stage renal disease) ([place] 08/30/2019   History of anemia due to CKD 08/18/2019   Hyperkalemia 08/17/2019   Hypothyroid 08/16/2019   Physical exam 02/18/2019   Greater trochanteric bursitis of left hip 08/04/2018   Osteoarthritis 09/25/2016   Screen for colon cancer    Benign neoplasm of transverse colon    Edema 07/28/2014   Adjustment disorder with depressed mood 03/24/2014   Small bowel mass 12/20/2013   Myalgia 07/07/2013   Loss of weight 04/19/2013   Polyarthralgia 04/19/2013   Loss of appetite 04/19/2013   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   Secondary cardiomyopathy ([place] 141/28/7867  SYSTOLIC HEART FAILURE, CHRONIC 06/07/2010   TESTICULAR MASS 04/04/2010   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[place] 03/07/2010    Hyperlipidemia 03/07/2010   GOUT 03/07/2010   Essential hypertension 03/07/2010   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    Immunization History  Administered Date(s) Administered  Fluad Quad(high Dose 65+) 02/18/2019, 02/16/2020, 02/26/2021, 02/25/2022   Influenza Whole 04/04/2010, 06/17/2012   Influenza, High Dose Seasonal PF 03/18/2018   Influenza,inj,Quad PF,6+ Mos 03/24/2014, 05/18/2015, 07/04/2017   Moderna Sars-Covid-2 Vaccination 07/20/2019, 08/21/2019   Pneumococcal Conjugate-13 02/14/2015   Pneumococcal Polysaccharide-23 08/15/2011    Conditions to be addressed/monitored: DMII ESRD CHF (systolic) Hypothyroidism Memory loss   Care Plan : [place]  Updates made by Edythe Clarity, RPH since 05/29/2022 12:00 AM     Problem: DMII ESRD CHF (systolic) Hypothyroidism Memory loss   Priority: High     Long-Range Goal: Disease Management   Start Date: 11/22/2020  Expected End Date: 11/22/2021  Recent Progress: On track  Priority: High  Note:   Current Barriers:  Fluid retention   Pharmacist Clinical Goal(s):  Patient will contact provider office for questions/concerns as evidenced notation of same in electronic health record through collaboration with PharmD and provider.   Interventions: 1:1 collaboration with [name], MD regarding development and update of comprehensive plan of care as evidenced by provider attestation and co-signature Inter-disciplinary care team collaboration (see longitudinal plan of care) Comprehensive medication review performed; medication list updated in electronic medical record   Memory loss (Goal: Prevent decline) 05/29/22 -Controlled -Current treatment  Donepezil 61m Appropriate, Effective, Safe, Accessible Memantine 122mBID Appropriate, Effective, Safe, Accessible -Medications previously tried: none noted (recently increased dose of donepezil)  -Son reports that he is remaining  stable with his cognitive impairment.  Continues to have around the clock care.  Dialysis schedule remains the same. Continue current meds. Denies any adverse effects at this time.  Hyperlipidemia: (LDL goal < 70) 12/13/21 Not at ideal goal last LDL 94, not assessed -Current treatment: Atorvastatin 20 mg once daily  -Current exercise habits: able to get out and go shopping or for walks with son without issue  -Current meal patterns: consistent meal pattern including post dialysis. No GI issues following donepezil start per son.  -Educated on Cholesterol goals;  consistent administration  -Counseled on diet and exercise extensively Recommended to continue current medication, not going to make any changes due to patient age and memory loss.  Update 05/23/21 Most recent labs were excellent with the exception of kidney numbers which are being handled with dialysis.  Son does mention that he has seen decline in cognitive function and has asked if there is anything we can do with his Aricept. Will consult with PCP on increased dose to Donepezil 1075maily. No issues with statin, he is active and still able to get out with son for walks, etc. Continue current medications at this time.  Heart Failure (Goal: manage symptoms and prevent exacerbations) 05/29/22 -Not ideally controlled -Last ejection fraction: 20-15-05%F type: Systolic -NYHA Class: II (slight limitation of activity) -AHA HF Stage: C (Heart disease and symptoms present) -Current treatment: Furosemide 24m35mpropriate, Effective, Safe, Accessible -Medications previously tried: carvedilol  -Current home BP/HR readings: controlled -Current home daily weights: controlled takes at dialyis -Educated on Benefits of medications for managing symptoms and prolonging life Importance of weighing daily; if you gain more than 3 pounds in one day or 5 pounds in one week, contact providers Proper diuretic administration and potassium  supplementation -Recommended to continue current medication He mentions a cough that is wet, could possible be fluid build up in the lungs. Continue meds at this time - monitor weight and swelling and please call if having shortness of breath or if weight gain is significant.  Diabetes (  A1c goal <7%) -Controlled, not assessed today -ESRD - Thrice weekly incenter HD -Current medications: No current medications  -Current home glucose readings: not routinely testing due to well controlled. fasting glucose: n/a post prandial glucose: n/a -Denies hypoglycemic/hyperglycemic symptoms -Educated on A1c and blood sugar goals; -Counseled to check feet daily and get yearly eye exams -Recommended to continue current medication  Hyperphosphatemia (goal: ensure tx tolerability and affordability) -Controlled, not assessed today -Current medications: Aurxyia 1 gram three times daily (New) - son reports not taking   Update 05/23/21 Son not sure if he was still taking this - he was going to reach out to dialysis. Not on current medication list - he reports that dialysis mentioned something about him needing this a few weeks ago. He will reach out to dialysis center and let us know if anything to be done on our end.        Patient Goals/Self-Care Activities Patient will:  - take medications as prescribed  Medication Assistance: None required.  Patient affirms current coverage meets needs. Patient's preferred pharmacy is:  [facility] [phone] - Closed - [place], [place] - [address] AT [facility] [address] [place] [address] Phone: [phone] Fax: [phone]  [id number] [place] [address] [place] [place]: [phone]: [phone] [place]Phone:  [phone]: [phone] [facility] [place] [address] [facility] [address]Phone: [phone]: [phone] [id number] [address] [id number]: [phone]: [phone]/Adherence/Medication fill history: Care Gaps: None  Star-Rating Drugs: Atorvastatin 2049m1/08/23 90ds  Uses pill box? Yes  Follow Up:  Patient agrees to Care Plan and Follow-up.  Plan:  6 month rph f/u telephone call or sooner if needed  Future Appointments  Date Time Provider DepMiddlebrook2/20/2023 10:40 AM TabMidge MiniumD [facility]  08/28/2022  7:00 AM [facility] DEVICE REMOTES [facility]  10/25/2022  1:45 PM KleDeboraha SprangD [facility]  12/30/2022 11:45 AM CamAlric RanD [facility] None   ChrBeverly MilchharmD Clinical Pharmacist  [phone]

## 2022-05-23 DIAGNOSIS — D689 Coagulation defect, unspecified: Secondary | ICD-10-CM | POA: Diagnosis not present

## 2022-05-23 DIAGNOSIS — Z992 Dependence on renal dialysis: Secondary | ICD-10-CM | POA: Diagnosis not present

## 2022-05-23 DIAGNOSIS — E876 Hypokalemia: Secondary | ICD-10-CM | POA: Diagnosis not present

## 2022-05-23 DIAGNOSIS — N186 End stage renal disease: Secondary | ICD-10-CM | POA: Diagnosis not present

## 2022-05-23 DIAGNOSIS — N2581 Secondary hyperparathyroidism of renal origin: Secondary | ICD-10-CM | POA: Diagnosis not present

## 2022-05-25 DIAGNOSIS — Z992 Dependence on renal dialysis: Secondary | ICD-10-CM | POA: Diagnosis not present

## 2022-05-25 DIAGNOSIS — N186 End stage renal disease: Secondary | ICD-10-CM | POA: Diagnosis not present

## 2022-05-25 DIAGNOSIS — E876 Hypokalemia: Secondary | ICD-10-CM | POA: Diagnosis not present

## 2022-05-25 DIAGNOSIS — N2581 Secondary hyperparathyroidism of renal origin: Secondary | ICD-10-CM | POA: Diagnosis not present

## 2022-05-25 DIAGNOSIS — D689 Coagulation defect, unspecified: Secondary | ICD-10-CM | POA: Diagnosis not present

## 2022-05-28 DIAGNOSIS — D689 Coagulation defect, unspecified: Secondary | ICD-10-CM | POA: Diagnosis not present

## 2022-05-28 DIAGNOSIS — N186 End stage renal disease: Secondary | ICD-10-CM | POA: Diagnosis not present

## 2022-05-28 DIAGNOSIS — Z992 Dependence on renal dialysis: Secondary | ICD-10-CM | POA: Diagnosis not present

## 2022-05-28 DIAGNOSIS — N2581 Secondary hyperparathyroidism of renal origin: Secondary | ICD-10-CM | POA: Diagnosis not present

## 2022-05-29 ENCOUNTER — Ambulatory Visit (INDEPENDENT_AMBULATORY_CARE_PROVIDER_SITE_OTHER): Payer: Medicare HMO

## 2022-05-29 ENCOUNTER — Ambulatory Visit: Payer: Medicare HMO | Admitting: Pharmacist

## 2022-05-29 DIAGNOSIS — I5022 Chronic systolic (congestive) heart failure: Secondary | ICD-10-CM

## 2022-05-29 DIAGNOSIS — R413 Other amnesia: Secondary | ICD-10-CM

## 2022-05-29 DIAGNOSIS — I428 Other cardiomyopathies: Secondary | ICD-10-CM

## 2022-05-29 LAB — CUP PACEART REMOTE DEVICE CHECK
Battery Remaining Longevity: 150 mo
Battery Remaining Percentage: 100 %
Brady Statistic RA Percent Paced: 0 %
Brady Statistic RV Percent Paced: 3 %
Date Time Interrogation Session: 20231213050200
HighPow Impedance: 37 Ohm
Implantable Lead Connection Status: 753985
Implantable Lead Connection Status: 753985
Implantable Lead Implant Date: 20091218
Implantable Lead Implant Date: 20091218
Implantable Lead Location: 753859
Implantable Lead Location: 753860
Implantable Lead Model: 158
Implantable Lead Serial Number: 131093
Implantable Pulse Generator Implant Date: 20220523
Lead Channel Impedance Value: 351 Ohm
Lead Channel Impedance Value: 424 Ohm
Lead Channel Setting Pacing Amplitude: 2 V
Lead Channel Setting Pacing Amplitude: 2.4 V
Lead Channel Setting Pacing Pulse Width: 0.4 ms
Lead Channel Setting Sensing Sensitivity: 0.5 mV
Pulse Gen Serial Number: 232629

## 2022-05-29 NOTE — Patient Instructions (Addendum)
Visit Information   Goals Addressed             This Visit's Progress    Manage My Medicine   On track    Timeframe:  Long-Range Goal Priority:  High Start Date:  05/23/21                           Expected End Date:  11/21/21                     Follow Up Date 08/21/21    - call for medicine refill 2 or 3 days before it runs out - call if I am sick and can't take my medicine - keep a list of all the medicines I take; vitamins and herbals too    Why is this important?   These steps will help you keep on track with your medicines.   Notes:        Patient Care Plan: CCM Pharmacy Care Plan     Problem Identified: DMII ESRD CHF (systolic) Hypothyroidism Memory loss   Priority: High     Long-Range Goal: Disease Management   Start Date: 11/22/2020  Expected End Date: 11/22/2021  Recent Progress: On track  Priority: High  Note:   Current Barriers:  Fluid retention   Pharmacist Clinical Goal(s):  Patient will contact provider office for questions/concerns as evidenced notation of same in electronic health record through collaboration with PharmD and provider.   Interventions: 1:1 collaboration with [name], MD regarding development and update of comprehensive plan of care as evidenced by provider attestation and co-signature Inter-disciplinary care team collaboration (see longitudinal plan of care) Comprehensive medication review performed; medication list updated in electronic medical record   Memory loss (Goal: Prevent decline) 05/29/22 -Controlled -Current treatment  Donepezil '10mg'$  Appropriate, Effective, Safe, Accessible Memantine '10mg'$  BID Appropriate, Effective, Safe, Accessible -Medications previously tried: none noted (recently increased dose of donepezil)  -Son reports that he is remaining stable with his cognitive impairment.  Continues to have around the clock care.  Dialysis schedule remains the same. Continue current meds. Denies any adverse  effects at this time.  Hyperlipidemia: (LDL goal < 70) 12/13/21 Not at ideal goal last LDL 94, not assessed -Current treatment: Atorvastatin 20 mg once daily  -Current exercise habits: able to get out and go shopping or for walks with son without issue  -Current meal patterns: consistent meal pattern including post dialysis. No GI issues following donepezil start per son.  -Educated on Cholesterol goals;  consistent administration  -Counseled on diet and exercise extensively Recommended to continue current medication, not going to make any changes due to patient age and memory loss.  Update 05/23/21 Most recent labs were excellent with the exception of kidney numbers which are being handled with dialysis.  Son does mention that he has seen decline in cognitive function and has asked if there is anything we can do with his Aricept. Will consult with PCP on increased dose to Donepezil '10mg'$  daily. No issues with statin, he is active and still able to get out with son for walks, etc. Continue current medications at this time.  Heart Failure (Goal: manage symptoms and prevent exacerbations) 05/29/22 -Not ideally controlled -Last ejection fraction: 93-23% -HF type: Systolic -NYHA Class: II (slight limitation of activity) -AHA HF Stage: C (Heart disease and symptoms present) -Current treatment: Furosemide '20mg'$  Appropriate, Effective, Safe, Accessible -Medications previously tried: carvedilol  -Current home BP/HR  readings: controlled -Current home daily weights: controlled takes at dialyis -Educated on Benefits of medications for managing symptoms and prolonging life Importance of weighing daily; if you gain more than 3 pounds in one day or 5 pounds in one week, contact providers Proper diuretic administration and potassium supplementation -Recommended to continue current medication He mentions a cough that is wet, could possible be fluid build up in the lungs. Continue meds at this time -  monitor weight and swelling and please call if having shortness of breath or if weight gain is significant.  Diabetes (A1c goal <7%) -Controlled, not assessed today -ESRD - Thrice weekly incenter HD -Current medications: No current medications  -Current home glucose readings: not routinely testing due to well controlled. fasting glucose: n/a post prandial glucose: n/a -Denies hypoglycemic/hyperglycemic symptoms -Educated on A1c and blood sugar goals; -Counseled to check feet daily and get yearly eye exams -Recommended to continue current medication  Hyperphosphatemia (goal: ensure tx tolerability and affordability) -Controlled, not assessed today -Current medications: Aurxyia 1 gram three times daily (New) - son reports not taking   Update 05/23/21 Son not sure if he was still taking this - he was going to reach out to dialysis. Not on current medication list - he reports that dialysis mentioned something about him needing this a few weeks ago. He will reach out to dialysis center and let us know if anything to be done on our end.         The patient verbalized understanding of instructions, educational materials, and care plan provided today and DECLINED offer to receive copy of patient instructions, educational materials, and care plan.  Telephone follow up appointment with pharmacy team member scheduled for: 6 months  Edythe Clarity, [place], PharmD Clinical Pharmacist  [facility] [phone]

## 2022-05-30 DIAGNOSIS — N186 End stage renal disease: Secondary | ICD-10-CM | POA: Diagnosis not present

## 2022-05-30 DIAGNOSIS — D689 Coagulation defect, unspecified: Secondary | ICD-10-CM | POA: Diagnosis not present

## 2022-05-30 DIAGNOSIS — Z992 Dependence on renal dialysis: Secondary | ICD-10-CM | POA: Diagnosis not present

## 2022-05-30 DIAGNOSIS — N2581 Secondary hyperparathyroidism of renal origin: Secondary | ICD-10-CM | POA: Diagnosis not present

## 2022-05-31 ENCOUNTER — Encounter (HOSPITAL_BASED_OUTPATIENT_CLINIC_OR_DEPARTMENT_OTHER): Payer: Self-pay | Admitting: Emergency Medicine

## 2022-05-31 ENCOUNTER — Emergency Department (HOSPITAL_BASED_OUTPATIENT_CLINIC_OR_DEPARTMENT_OTHER): Payer: Medicare HMO

## 2022-05-31 ENCOUNTER — Other Ambulatory Visit: Payer: Self-pay

## 2022-05-31 ENCOUNTER — Emergency Department (HOSPITAL_BASED_OUTPATIENT_CLINIC_OR_DEPARTMENT_OTHER)
Admission: EM | Admit: 2022-05-31 | Discharge: 2022-05-31 | Disposition: A | Discharge status: Home / Self Care | Payer: Medicare HMO | Attending: Emergency Medicine | Admitting: Emergency Medicine

## 2022-05-31 ENCOUNTER — Emergency Department (HOSPITAL_BASED_OUTPATIENT_CLINIC_OR_DEPARTMENT_OTHER): Payer: Medicare HMO | Admitting: Radiology

## 2022-05-31 DIAGNOSIS — E1122 Type 2 diabetes mellitus with diabetic chronic kidney disease: Secondary | ICD-10-CM | POA: Diagnosis not present

## 2022-05-31 DIAGNOSIS — F0390 Unspecified dementia without behavioral disturbance: Secondary | ICD-10-CM | POA: Diagnosis not present

## 2022-05-31 DIAGNOSIS — I509 Heart failure, unspecified: Secondary | ICD-10-CM | POA: Diagnosis not present

## 2022-05-31 DIAGNOSIS — M7989 Other specified soft tissue disorders: Secondary | ICD-10-CM

## 2022-05-31 DIAGNOSIS — R600 Localized edema: Secondary | ICD-10-CM | POA: Insufficient documentation

## 2022-05-31 DIAGNOSIS — R0602 Shortness of breath: Secondary | ICD-10-CM | POA: Insufficient documentation

## 2022-05-31 DIAGNOSIS — Z992 Dependence on renal dialysis: Secondary | ICD-10-CM | POA: Diagnosis not present

## 2022-05-31 DIAGNOSIS — I132 Hypertensive heart and chronic kidney disease with heart failure and with stage 5 chronic kidney disease, or end stage renal disease: Secondary | ICD-10-CM | POA: Diagnosis not present

## 2022-05-31 DIAGNOSIS — Z79899 Other long term (current) drug therapy: Secondary | ICD-10-CM | POA: Insufficient documentation

## 2022-05-31 DIAGNOSIS — R2240 Localized swelling, mass and lump, unspecified lower limb: Secondary | ICD-10-CM | POA: Diagnosis not present

## 2022-05-31 DIAGNOSIS — Z1152 Encounter for screening for COVID-19: Secondary | ICD-10-CM | POA: Insufficient documentation

## 2022-05-31 DIAGNOSIS — R059 Cough, unspecified: Secondary | ICD-10-CM | POA: Diagnosis not present

## 2022-05-31 DIAGNOSIS — N186 End stage renal disease: Secondary | ICD-10-CM | POA: Insufficient documentation

## 2022-05-31 DIAGNOSIS — R69 Illness, unspecified: Secondary | ICD-10-CM | POA: Diagnosis not present

## 2022-05-31 DIAGNOSIS — N281 Cyst of kidney, acquired: Secondary | ICD-10-CM | POA: Diagnosis not present

## 2022-05-31 DIAGNOSIS — J9811 Atelectasis: Secondary | ICD-10-CM | POA: Diagnosis not present

## 2022-05-31 LAB — CBC WITH DIFFERENTIAL/PLATELET
Abs Immature Granulocytes: 0.02 10*3/uL (ref 0.00–0.07)
Basophils Absolute: 0 10*3/uL (ref 0.0–0.1)
Basophils Relative: 1 %
Eosinophils Absolute: 0.2 10*3/uL (ref 0.0–0.5)
Eosinophils Relative: 3 %
HCT: 35.7 % — ABNORMAL LOW (ref 39.0–52.0)
Hemoglobin: 11.5 g/dL — ABNORMAL LOW (ref 13.0–17.0)
Immature Granulocytes: 0 %
Lymphocytes Relative: 9 %
Lymphs Abs: 0.5 10*3/uL — ABNORMAL LOW (ref 0.7–4.0)
MCH: 28.7 pg (ref 26.0–34.0)
MCHC: 32.2 g/dL (ref 30.0–36.0)
MCV: 89 fL (ref 80.0–100.0)
Monocytes Absolute: 0.7 10*3/uL (ref 0.1–1.0)
Monocytes Relative: 11 %
Neutro Abs: 4.5 10*3/uL (ref 1.7–7.7)
Neutrophils Relative %: 76 %
Platelets: 144 10*3/uL — ABNORMAL LOW (ref 150–400)
RBC: 4.01 MIL/uL — ABNORMAL LOW (ref 4.22–5.81)
RDW: 19.1 % — ABNORMAL HIGH (ref 11.5–15.5)
WBC: 6 10*3/uL (ref 4.0–10.5)
nRBC: 0 % (ref 0.0–0.2)

## 2022-05-31 LAB — BASIC METABOLIC PANEL
Anion gap: 16 — ABNORMAL HIGH (ref 5–15)
BUN: 34 mg/dL — ABNORMAL HIGH (ref 8–23)
CO2: 27 mmol/L (ref 22–32)
Calcium: 9.1 mg/dL (ref 8.9–10.3)
Chloride: 94 mmol/L — ABNORMAL LOW (ref 98–111)
Creatinine, Ser: 5.44 mg/dL — ABNORMAL HIGH (ref 0.61–1.24)
GFR, Estimated: 10 mL/min — ABNORMAL LOW (ref >60)
Glucose, Bld: 175 mg/dL — ABNORMAL HIGH (ref 70–99)
Potassium: 4.4 mmol/L (ref 3.5–5.1)
Sodium: 137 mmol/L (ref 135–145)

## 2022-05-31 LAB — TROPONIN I (HIGH SENSITIVITY)
Troponin I (High Sensitivity): 43 ng/L — ABNORMAL HIGH (ref <18)
Troponin I (High Sensitivity): 39 ng/L — ABNORMAL HIGH (ref <18)

## 2022-05-31 LAB — RESP PANEL BY RT-PCR (RSV, FLU A&B, COVID)  RVPGX2
Influenza A by PCR: NEGATIVE
Influenza B by PCR: NEGATIVE
Resp Syncytial Virus by PCR: NEGATIVE
SARS Coronavirus 2 by RT PCR: NEGATIVE

## 2022-05-31 LAB — BRAIN NATRIURETIC PEPTIDE: B Natriuretic Peptide: >4500 pg/mL — ABNORMAL HIGH (ref 0.0–100.0)

## 2022-05-31 MED ORDER — FUROSEMIDE 10 MG/ML IJ SOLN
40.0000 mg | Freq: Once | INTRAMUSCULAR | Status: AC
  Administered 2022-05-31: 40 mg via INTRAVENOUS
  Filled 2022-05-31: qty 4

## 2022-05-31 MED ORDER — IOHEXOL 350 MG/ML SOLN
100.0000 mL | Freq: Once | INTRAVENOUS | Status: AC | PRN
  Administered 2022-05-31: 69 mL via INTRAVENOUS

## 2022-05-31 MED ORDER — DOXYCYCLINE HYCLATE 100 MG PO CAPS: 100.0000 mg | ORAL_CAPSULE | Freq: Two times a day (BID) | ORAL | 0 refills | Status: DC

## 2022-05-31 NOTE — ED Provider Notes (Signed)
[facility] EMERGENCY DEPT Provider Note   CSN: [id number] Arrival date & time: 05/31/22  1523     History  Chief Complaint  Patient presents with   Leg Swelling    Tyler Hahn is a 81 y.o. male.  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  Caregiver states he has had some cough and congestion for the past several months ever since being diagnosed with pneumonia which has not improved.  Has intermittent cough, congestion and shortness of breath that does not improve with dialysis.  Has not missed any dialysis sessions.  Does have intermittent left-sided chest pain that comes and goes lasting for several minutes at a time.  Caregiver has noticed over the past several days he is at increased welling to his bilateral legs and ongoing shortness of breath and cough.  They feel has become more confused and have really got over his pneumonia.  No missed dialysis sessions.  Makes minimal urine.  No history of blood clots.  No blood thinner use.  Denies chest pain currently.  States he normally has a sleep propped up and cannot lie flat at baseline.  The history is provided by the patient and a caregiver.       Home Medications Prior to Admission medications   Medication Sig Start Date End Date Taking? Authorizing Provider  allopurinol (ZYLOPRIM) 100 MG tablet TAKE 1 TABLET BY MOUTH 3 TIMES A WEEK ON MONDAY, [facility] AND FRIDAY 05/16/22   [name], MD  atorvastatin (LIPITOR) 20 MG tablet Take 1 tablet (20 mg total) by mouth daily. 04/24/22   [name], MD  donepezil (ARICEPT) 10 MG tablet Take 1 tablet (10 mg total) by mouth at bedtime. 05/23/21   [name], MD  doxycycline (VIBRAMYCIN) 100 MG capsule Take 1 capsule (100 mg total) by mouth 2 (two) times daily. 04/16/22   Orpah Greek, MD   fluticasone (FLONASE) 50 MCG/ACT nasal spray Place 1 spray into both nostrils daily. 04/08/22   Wendie Agreste, MD  furosemide (LASIX) 20 MG tablet Take 1 tablet (20 mg total) by mouth daily. 04/23/22   [name], MD  hydrocortisone cream 1 % Apply to affected area 2 times daily 09/29/21   Lowella Petties R, NP  ipratropium (ATROVENT) 0.06 % nasal spray Place 1-2 sprays into both nostrils 4 (four) times daily. As needed for nasal congestion 04/08/22   Wendie Agreste, MD  levothyroxine (SYNTHROID) 50 MCG tablet Take 1 tablet (50 mcg total) by mouth daily. 04/23/22   [name], MD  loratadine (CLARITIN) 10 MG tablet Take 1 tablet (10 mg total) by mouth daily. 02/25/22   [name], MD  memantine (NAMENDA) 10 MG tablet Take 10 mg by mouth 2 (two) times daily. 02/26/22   [provider]  Multiple Vitamins-Minerals (MULTIVITAMIN WITH MINERALS) tablet Take 1 tablet by mouth daily.    [provider]  omeprazole (PRILOSEC) 40 MG capsule TAKE 1 CAPSULE BY MOUTH DAILY. 04/01/22   [name], MD  ondansetron (ZOFRAN-ODT) 4 MG disintegrating tablet '4mg'$  ODT q4 hours prn nausea/vomit 04/16/22   Pollina, Gwenyth Allegra, MD  OVER THE COUNTER MEDICATION Take 1 capsule by mouth daily. [employer]    [provider]  sertraline (ZOLOFT) 100 MG tablet TAKE 1 TABLET BY MOUTH EVERY DAY 04/08/22   [name],  MD      Allergies    Sulfa antibiotics and Sulfonamide derivatives    Review of Systems   Review of Systems  Constitutional:  Negative for activity change, appetite change and fever.  HENT:  Negative for congestion and rhinorrhea.   Respiratory:  Positive for cough, chest tightness and shortness of breath.   Cardiovascular:  Positive for chest pain and leg swelling.  Gastrointestinal:  Negative for abdominal pain, nausea and vomiting.  Genitourinary:  Negative for dysuria and hematuria.  Musculoskeletal:  Negative for arthralgias and  myalgias.  Skin:  Negative for rash.  Neurological:  Positive for weakness.   all other systems are negative except as noted in the HPI and PMH.    Physical Exam Updated Vital Signs BP 106/62 (BP Location: Left Arm)   Pulse (!) 103   Temp 98.8 F (37.1 C)   Resp 20   Ht '5\' 7"'$  (1.702 m)   Wt 80.6 kg   SpO2 96%   BMI 27.83 kg/m  Physical Exam Vitals and nursing note reviewed.  Constitutional:      General: He is not in acute distress.    Appearance: He is well-developed.  HENT:     Head: Normocephalic and atraumatic.     Mouth/Throat:     Pharynx: No oropharyngeal exudate.  Eyes:     Conjunctiva/sclera: Conjunctivae normal.     Pupils: Pupils are equal, round, and reactive to light.  Neck:     Comments: No meningismus. Cardiovascular:     Rate and Rhythm: Normal rate and regular rhythm.     Heart sounds: Normal heart sounds. No murmur heard. Pulmonary:     Effort: Pulmonary effort is normal. No respiratory distress.     Breath sounds: Rhonchi and rales present.     Comments: Bibasilar rhonchi Chest:     Chest wall: No tenderness.  Abdominal:     Palpations: Abdomen is soft.     Tenderness: There is no abdominal tenderness. There is no guarding or rebound.  Musculoskeletal:        General: No tenderness. Normal range of motion.     Cervical back: Normal range of motion and neck supple.     Right lower leg: Edema present.     Left lower leg: Edema present.     Comments: +2 edema to knees bilaterally.  Intact DP pulses  RUE fistula with thrill  Skin:    General: Skin is warm.  Neurological:     Mental Status: He is alert and oriented to person, place, and time.     Cranial Nerves: No cranial nerve deficit.     Motor: No abnormal muscle tone.     Coordination: Coordination normal.     Comments:  5/5 strength throughout. CN 2-12 intact.Equal grip strength.   Psychiatric:        Behavior: Behavior normal.     ED Results / Procedures / Treatments   Labs (all  labs ordered are listed, but only abnormal results are displayed) Labs Reviewed  CBC WITH DIFFERENTIAL/PLATELET - Abnormal; Notable for the following components:      Result Value   RBC 4.01 (*)    Hemoglobin 11.5 (*)    HCT 35.7 (*)    RDW 19.1 (*)    Platelets 144 (*)    Lymphs Abs 0.5 (*)    All other components within normal limits  BASIC METABOLIC PANEL - Abnormal; Notable for the following components:   Chloride 94 (*)  Glucose, Bld 175 (*)    BUN 34 (*)    Creatinine, Ser 5.44 (*)    GFR, Estimated 10 (*)    Anion gap 16 (*)    All other components within normal limits  TROPONIN I (HIGH SENSITIVITY) - Abnormal; Notable for the following components:   Troponin I (High Sensitivity) 43 (*)    All other components within normal limits  TROPONIN I (HIGH SENSITIVITY) - Abnormal; Notable for the following components:   Troponin I (High Sensitivity) 39 (*)    All other components within normal limits  RESP PANEL BY RT-PCR (RSV, FLU A&B, COVID)  RVPGX2  BRAIN NATRIURETIC PEPTIDE    EKG EKG Interpretation  Date/Time:  Friday May 31 2022 16:09:57 EST Ventricular Rate:  102 PR Interval:  216 QRS Duration: 102 QT Interval:  354 QTC Calculation: 461 R Axis:   75 Text Interpretation: Sinus tachycardia with 1st degree A-V block with Fusion complexes Possible Left atrial enlargement Possible Anterior infarct (cited on or before 31-May-2022) Abnormal ECG When compared with ECG of 16-Apr-2022 03:25, Fusion complexes are now Present Left posterior fascicular block is no longer Present Non-specific change in ST segment in Lateral leads QT has shortened Rate faster Artifact Confirmed by Ezequiel Essex [id number]) on 05/31/2022 6:38:03 PM  Radiology DG Chest 2 View  Result Date: 05/31/2022 CLINICAL DATA:  Cough. EXAM: CHEST - 2 VIEW COMPARISON:  April 22, 2022. FINDINGS: Stable cardiomegaly. Left-sided defibrillator is unchanged in position. Mild bibasilar subsegmental atelectasis  is noted. Bony thorax is unremarkable. IMPRESSION: Mild bibasilar subsegmental atelectasis. Electronically Signed   By: Marijo Conception M.D.   On: 05/31/2022 16:31    Procedures Procedures    Medications Ordered in ED Medications - No data to display  ED Course/ Medical Decision Making/ A&P                           Medical Decision Making Amount and/or Complexity of Data Reviewed Labs: ordered. Decision-making details documented in ED Course. Radiology: ordered and independent interpretation performed. Decision-making details documented in ED Course. ECG/medicine tests: ordered and independent interpretation performed. Decision-making details documented in ED Course.  Risk Prescription drug management.  Dialysis patient with persistent cough and congestion for the past several months and increased leg swelling over the past several days.  No fever.  No chest pain currently.  EKG shows no acute ischemia.  Chest x-ray shows basilar atelectasis.  Patient in triage are negative for DVT.  Patient has no increased work of breathing at rest.  Labs appear to be at baseline.  Patient able to ambulate without desaturation.  Troponin remains flat.  Low suspicion for ACS.  CT negative for pulmonary embolism but does show interstitial edema.  Suspect his cough, shortness of breath and leg swelling secondary to volume overload.  He is on Lasix by his PCP but is unclear whether this is effective given his ESRD status.  He makes minimal urine.  Discussed he should talk with his nephrologist to increase fluid removal at dialysis.  He is due for dialysis in the morning and encouraged to keep this appointment.  Low suspicion for ACS.  Recommend follow-up for dialysis as scheduled with follow-up with nephrology for increased removal of volume. Give additional course of antibiotics for component of possible bronchitis though favor most of his symptoms are due to volume overload.  Patient knows to go to  dialysis in the morning.  ED return precautions  given.        Final Clinical Impression(s) / ED Diagnoses Final diagnoses:  Leg swelling    Rx / DC Orders ED Discharge Orders     None         Houda Brau, Annie Main, MD 05/31/22 2254

## 2022-05-31 NOTE — ED Notes (Signed)
RN provided AVS using Teachback Method. Patient verbalizes understanding of Discharge Instructions. Opportunity for Questioning and Answers were provided by RN. Patient Discharged from ED ambulatory to Home with Family. ? ?

## 2022-05-31 NOTE — ED Notes (Signed)
Patient ambulated well on Pulse Oximetry with Pulse maintaining at 100 and Oxygen Saturation at 98% continuously. No New SOB or complaints while ambulating.

## 2022-05-31 NOTE — ED Notes (Signed)
Pt's IV initially leaked during 1st test bolus of saline at 4cc/sec for CTA Chest -- tech removed dressing, tightened hub of IV, replaced dressing & proceeded with a second test injection of saline with pt's arm at side -- tech felt as saline was injecting & pt denied any pain -- pt set up for bolus injection of contrast & pt made aware to say something if any pain, discomfort or leaking at the IV site -- pt started to say something & injection was immediately stopped -- pt had an extravasation of approx 50cc of Omni 350 in left arm -- pt taken back to room & RN notified of situation.

## 2022-05-31 NOTE — Discharge Instructions (Signed)
Follow-up for dialysis tomorrow as scheduled.  It appears your nephrologist should arrange to have more volume removed at your dialysis sessions.  No evidence of heart attack or blood clot in the lung today.  Return to the ED with exertional chest pain, worsening shortness of breath, intractable nausea vomiting or other concerns.

## 2022-05-31 NOTE — ED Triage Notes (Signed)
Pt presents to ED POV. Per caregiver pt c/o cough and LE edema.caregiver reports that pt has been very lethargic that past week and never gotten over pneumonia that began 14mago. Caregiver also report pt more confused than normal. Dialysis pt tu/th/sat. Reportedly had normal dialysis yesterday.

## 2022-05-31 NOTE — ED Notes (Signed)
US at the Bedside. °

## 2022-06-01 ENCOUNTER — Other Ambulatory Visit: Payer: Self-pay | Admitting: Family Medicine

## 2022-06-01 DIAGNOSIS — Z992 Dependence on renal dialysis: Secondary | ICD-10-CM | POA: Diagnosis not present

## 2022-06-01 DIAGNOSIS — N186 End stage renal disease: Secondary | ICD-10-CM | POA: Diagnosis not present

## 2022-06-01 DIAGNOSIS — D689 Coagulation defect, unspecified: Secondary | ICD-10-CM | POA: Diagnosis not present

## 2022-06-01 DIAGNOSIS — R413 Other amnesia: Secondary | ICD-10-CM

## 2022-06-01 DIAGNOSIS — N2581 Secondary hyperparathyroidism of renal origin: Secondary | ICD-10-CM | POA: Diagnosis not present

## 2022-06-04 DIAGNOSIS — N2581 Secondary hyperparathyroidism of renal origin: Secondary | ICD-10-CM | POA: Diagnosis not present

## 2022-06-04 DIAGNOSIS — D689 Coagulation defect, unspecified: Secondary | ICD-10-CM | POA: Diagnosis not present

## 2022-06-04 DIAGNOSIS — N186 End stage renal disease: Secondary | ICD-10-CM | POA: Diagnosis not present

## 2022-06-04 DIAGNOSIS — D631 Anemia in chronic kidney disease: Secondary | ICD-10-CM | POA: Diagnosis not present

## 2022-06-04 DIAGNOSIS — D509 Iron deficiency anemia, unspecified: Secondary | ICD-10-CM | POA: Diagnosis not present

## 2022-06-04 DIAGNOSIS — Z992 Dependence on renal dialysis: Secondary | ICD-10-CM | POA: Diagnosis not present

## 2022-06-04 DIAGNOSIS — E876 Hypokalemia: Secondary | ICD-10-CM | POA: Diagnosis not present

## 2022-06-05 ENCOUNTER — Telehealth: Payer: Self-pay

## 2022-06-05 ENCOUNTER — Ambulatory Visit (INDEPENDENT_AMBULATORY_CARE_PROVIDER_SITE_OTHER): Payer: Medicare HMO | Admitting: Family Medicine

## 2022-06-05 ENCOUNTER — Encounter: Payer: Self-pay | Admitting: Family Medicine

## 2022-06-05 VITALS — BP 136/60 | HR 79 | Temp 97.9°F | Resp 17 | Ht 67.0 in | Wt 179.0 lb

## 2022-06-05 DIAGNOSIS — F0390 Unspecified dementia without behavioral disturbance: Secondary | ICD-10-CM | POA: Insufficient documentation

## 2022-06-05 DIAGNOSIS — E1122 Type 2 diabetes mellitus with diabetic chronic kidney disease: Secondary | ICD-10-CM

## 2022-06-05 DIAGNOSIS — R69 Illness, unspecified: Secondary | ICD-10-CM | POA: Diagnosis not present

## 2022-06-05 DIAGNOSIS — N186 End stage renal disease: Secondary | ICD-10-CM | POA: Diagnosis not present

## 2022-06-05 DIAGNOSIS — J40 Bronchitis, not specified as acute or chronic: Secondary | ICD-10-CM | POA: Diagnosis not present

## 2022-06-05 DIAGNOSIS — G4733 Obstructive sleep apnea (adult) (pediatric): Secondary | ICD-10-CM

## 2022-06-05 DIAGNOSIS — F03B0 Unspecified dementia, moderate, without behavioral disturbance, psychotic disturbance, mood disturbance, and anxiety: Secondary | ICD-10-CM | POA: Diagnosis not present

## 2022-06-05 LAB — BASIC METABOLIC PANEL
BUN: 35 mg/dL — ABNORMAL HIGH (ref 6–23)
CO2: 31 mEq/L (ref 19–32)
Calcium: 9.5 mg/dL (ref 8.4–10.5)
Chloride: 95 mEq/L — ABNORMAL LOW (ref 96–112)
Creatinine, Ser: 5.59 mg/dL (ref 0.40–1.50)
GFR: 9.04 mL/min — CL (ref >60.00)
Glucose, Bld: 165 mg/dL — ABNORMAL HIGH (ref 70–99)
Potassium: 4.4 mEq/L (ref 3.5–5.1)
Sodium: 139 mEq/L (ref 135–145)

## 2022-06-05 LAB — HEMOGLOBIN A1C: Hgb A1c MFr Bld: 7.1 % — ABNORMAL HIGH (ref 4.6–6.5)

## 2022-06-05 MED ORDER — SERTRALINE HCL 100 MG PO TABS: 150.0000 mg | ORAL_TABLET | Freq: Every day | ORAL | 2 refills | Status: DC

## 2022-06-05 NOTE — Patient Instructions (Addendum)
Schedule your physical in 6 months We'll notify you of your lab results and make any changes if needed Stay on dialysis to help w/ the fluid situation We'll call you with your sleep referral FINISH the Doxycycline as directed CONTINUE the Furosemide even if it only helps a little INCREASE the Sertraline to 1.5 tabs daily Call with any questions or concerns Happy Holidays!

## 2022-06-05 NOTE — Assessment & Plan Note (Signed)
Ongoing issue for pt.  He did not do well on Namenda as this made him very cranky and irritable.  He has returned to baseline since stopping medication.  He is dealing w/ some sadness.  Will increase Sertraline to '150mg'$  daily.  Caregiver expressed understanding and agreement.

## 2022-06-05 NOTE — Progress Notes (Signed)
   Subjective:    Patient ID: Tyler Hahn, male    DOB: [birth year], 81 y.o.   MRN: [id number]  HPI DM- chronic problem, not currently on medication.  Last A1C 5.8%  Bronchitis- currently on Doxycycline per the ER.  Pt's family reports cough is improving- starting to loosen up and become productive.    Dementia- currently on Aricept.  Had to stop Namenda b/c it caused him to be very cranky.  Mood has normalized since stopping medication.  A lot of times mood will depend on how he is physically feeling.    Possible OSA- family was told in the hospital that he needs evaluation for possible OSA.  They reported that every time he fell asleep his sats would drop into the 80s.  Caregiver reports he will have breathing pauses- 'even when napping'.   Review of Systems For ROS see HPI     Objective:   Physical Exam Vitals reviewed.  Constitutional:      General: He is not in acute distress.    Appearance: Normal appearance. He is not ill-appearing.  HENT:     Head: Normocephalic and atraumatic.     Nose: No congestion or rhinorrhea.  Eyes:     Extraocular Movements: Extraocular movements intact.     Conjunctiva/sclera: Conjunctivae normal.  Cardiovascular:     Rate and Rhythm: Normal rate and regular rhythm.  Pulmonary:     Effort: Pulmonary effort is normal. No respiratory distress.     Breath sounds: Rales (initially had rales at both bases but this cleared w/ multiple deep breaths) present.  Abdominal:     General: There is no distension.     Palpations: Abdomen is soft.     Tenderness: There is no abdominal tenderness.  Musculoskeletal:     Cervical back: Normal range of motion.     Right lower leg: Edema (2+) present.     Left lower leg: Edema (2+) present.  Lymphadenopathy:     Cervical: No cervical adenopathy.  Skin:    General: Skin is warm and dry.  Neurological:     Mental Status: He is alert.  Psychiatric:        Mood and Affect: Mood normal.        Behavior:  Behavior normal.        Thought Content: Thought content normal.           Assessment & Plan:   Bronchitis- new.  Pt was seen in ER and started on Doxycycline.  Family reports he is doing better and cough is starting to loosen up.  He did have crackles over both lung bases but these improved w/ deep breaths.  Encouraged family to work with him on this every few hours to prevent atelectasis.  OSA- new.  ER recommended a formal evaluation given his O2 sats would fall when he was asleep.  Will refer for complete evaluation and tx prn.

## 2022-06-05 NOTE — Assessment & Plan Note (Signed)
Ongoing issue for pt.  He is currently off all medication and last A1C was 5.8%.  He is on HD and does not need a microalbumin.  UTD on eye exam and foot exam.  Check labs.  Adjust tx plan prn.

## 2022-06-05 NOTE — Telephone Encounter (Signed)
Tyler Hahn called form the lab to report a critical.   GFR 9.10  Creatinine 5.56

## 2022-06-06 DIAGNOSIS — D689 Coagulation defect, unspecified: Secondary | ICD-10-CM | POA: Diagnosis not present

## 2022-06-06 DIAGNOSIS — N2581 Secondary hyperparathyroidism of renal origin: Secondary | ICD-10-CM | POA: Diagnosis not present

## 2022-06-06 DIAGNOSIS — Z992 Dependence on renal dialysis: Secondary | ICD-10-CM | POA: Diagnosis not present

## 2022-06-06 DIAGNOSIS — D631 Anemia in chronic kidney disease: Secondary | ICD-10-CM | POA: Diagnosis not present

## 2022-06-06 DIAGNOSIS — D509 Iron deficiency anemia, unspecified: Secondary | ICD-10-CM | POA: Diagnosis not present

## 2022-06-06 DIAGNOSIS — N186 End stage renal disease: Secondary | ICD-10-CM | POA: Diagnosis not present

## 2022-06-06 DIAGNOSIS — E876 Hypokalemia: Secondary | ICD-10-CM | POA: Diagnosis not present

## 2022-06-06 NOTE — Telephone Encounter (Signed)
Noted.  Pt is on dialysis.  No cause for concern

## 2022-06-08 DIAGNOSIS — D631 Anemia in chronic kidney disease: Secondary | ICD-10-CM | POA: Diagnosis not present

## 2022-06-08 DIAGNOSIS — E876 Hypokalemia: Secondary | ICD-10-CM | POA: Diagnosis not present

## 2022-06-08 DIAGNOSIS — Z992 Dependence on renal dialysis: Secondary | ICD-10-CM | POA: Diagnosis not present

## 2022-06-08 DIAGNOSIS — N2581 Secondary hyperparathyroidism of renal origin: Secondary | ICD-10-CM | POA: Diagnosis not present

## 2022-06-08 DIAGNOSIS — N186 End stage renal disease: Secondary | ICD-10-CM | POA: Diagnosis not present

## 2022-06-08 DIAGNOSIS — D689 Coagulation defect, unspecified: Secondary | ICD-10-CM | POA: Diagnosis not present

## 2022-06-08 DIAGNOSIS — D509 Iron deficiency anemia, unspecified: Secondary | ICD-10-CM | POA: Diagnosis not present

## 2022-06-11 DIAGNOSIS — D509 Iron deficiency anemia, unspecified: Secondary | ICD-10-CM | POA: Diagnosis not present

## 2022-06-11 DIAGNOSIS — Z992 Dependence on renal dialysis: Secondary | ICD-10-CM | POA: Diagnosis not present

## 2022-06-11 DIAGNOSIS — D631 Anemia in chronic kidney disease: Secondary | ICD-10-CM | POA: Diagnosis not present

## 2022-06-11 DIAGNOSIS — D689 Coagulation defect, unspecified: Secondary | ICD-10-CM | POA: Diagnosis not present

## 2022-06-11 DIAGNOSIS — N2581 Secondary hyperparathyroidism of renal origin: Secondary | ICD-10-CM | POA: Diagnosis not present

## 2022-06-11 DIAGNOSIS — N186 End stage renal disease: Secondary | ICD-10-CM | POA: Diagnosis not present

## 2022-06-13 DIAGNOSIS — Z992 Dependence on renal dialysis: Secondary | ICD-10-CM | POA: Diagnosis not present

## 2022-06-13 DIAGNOSIS — D689 Coagulation defect, unspecified: Secondary | ICD-10-CM | POA: Diagnosis not present

## 2022-06-13 DIAGNOSIS — D509 Iron deficiency anemia, unspecified: Secondary | ICD-10-CM | POA: Diagnosis not present

## 2022-06-13 DIAGNOSIS — D631 Anemia in chronic kidney disease: Secondary | ICD-10-CM | POA: Diagnosis not present

## 2022-06-13 DIAGNOSIS — N186 End stage renal disease: Secondary | ICD-10-CM | POA: Diagnosis not present

## 2022-06-13 DIAGNOSIS — N2581 Secondary hyperparathyroidism of renal origin: Secondary | ICD-10-CM | POA: Diagnosis not present

## 2022-06-15 DIAGNOSIS — N186 End stage renal disease: Secondary | ICD-10-CM | POA: Diagnosis not present

## 2022-06-15 DIAGNOSIS — D509 Iron deficiency anemia, unspecified: Secondary | ICD-10-CM | POA: Diagnosis not present

## 2022-06-15 DIAGNOSIS — D689 Coagulation defect, unspecified: Secondary | ICD-10-CM | POA: Diagnosis not present

## 2022-06-15 DIAGNOSIS — Z992 Dependence on renal dialysis: Secondary | ICD-10-CM | POA: Diagnosis not present

## 2022-06-15 DIAGNOSIS — D631 Anemia in chronic kidney disease: Secondary | ICD-10-CM | POA: Diagnosis not present

## 2022-06-15 DIAGNOSIS — N2581 Secondary hyperparathyroidism of renal origin: Secondary | ICD-10-CM | POA: Diagnosis not present

## 2022-06-16 DIAGNOSIS — I129 Hypertensive chronic kidney disease with stage 1 through stage 4 chronic kidney disease, or unspecified chronic kidney disease: Secondary | ICD-10-CM | POA: Diagnosis not present

## 2022-06-16 DIAGNOSIS — Z992 Dependence on renal dialysis: Secondary | ICD-10-CM | POA: Diagnosis not present

## 2022-06-16 DIAGNOSIS — N186 End stage renal disease: Secondary | ICD-10-CM | POA: Diagnosis not present

## 2022-06-18 DIAGNOSIS — D689 Coagulation defect, unspecified: Secondary | ICD-10-CM | POA: Diagnosis not present

## 2022-06-18 DIAGNOSIS — Z992 Dependence on renal dialysis: Secondary | ICD-10-CM | POA: Diagnosis not present

## 2022-06-18 DIAGNOSIS — D509 Iron deficiency anemia, unspecified: Secondary | ICD-10-CM | POA: Diagnosis not present

## 2022-06-18 DIAGNOSIS — N186 End stage renal disease: Secondary | ICD-10-CM | POA: Diagnosis not present

## 2022-06-18 DIAGNOSIS — E876 Hypokalemia: Secondary | ICD-10-CM | POA: Diagnosis not present

## 2022-06-18 DIAGNOSIS — N2581 Secondary hyperparathyroidism of renal origin: Secondary | ICD-10-CM | POA: Diagnosis not present

## 2022-06-18 DIAGNOSIS — E1122 Type 2 diabetes mellitus with diabetic chronic kidney disease: Secondary | ICD-10-CM | POA: Diagnosis not present

## 2022-06-19 ENCOUNTER — Telehealth: Payer: Self-pay

## 2022-06-19 NOTE — Telephone Encounter (Signed)
Left lab results on son's VM he is on HIPP[employer]

## 2022-06-19 NOTE — Telephone Encounter (Signed)
-----   Message from [name], MD sent at 06/18/2022  4:39 PM EST ----- A1C (marker of diabetes control) has jumped from 5.8 --> 7.1%  This indicates poor sugar control but may be reflective of holiday eating.  Try and be mindful of a low carb/low sugar diet.  Everything else is stable

## 2022-06-20 DIAGNOSIS — E1122 Type 2 diabetes mellitus with diabetic chronic kidney disease: Secondary | ICD-10-CM | POA: Diagnosis not present

## 2022-06-20 DIAGNOSIS — E876 Hypokalemia: Secondary | ICD-10-CM | POA: Diagnosis not present

## 2022-06-20 DIAGNOSIS — N2581 Secondary hyperparathyroidism of renal origin: Secondary | ICD-10-CM | POA: Diagnosis not present

## 2022-06-20 DIAGNOSIS — D689 Coagulation defect, unspecified: Secondary | ICD-10-CM | POA: Diagnosis not present

## 2022-06-20 DIAGNOSIS — Z992 Dependence on renal dialysis: Secondary | ICD-10-CM | POA: Diagnosis not present

## 2022-06-20 DIAGNOSIS — N186 End stage renal disease: Secondary | ICD-10-CM | POA: Diagnosis not present

## 2022-06-20 DIAGNOSIS — D509 Iron deficiency anemia, unspecified: Secondary | ICD-10-CM | POA: Diagnosis not present

## 2022-06-21 ENCOUNTER — Encounter: Payer: Medicare HMO | Admitting: Nurse Practitioner

## 2022-06-21 ENCOUNTER — Encounter: Payer: Self-pay | Admitting: Family Medicine

## 2022-06-21 NOTE — Progress Notes (Signed)
Because of your age and recent illness with ongoing symptoms, I feel your condition warrants further evaluation and I recommend that you be seen in a face to face visit.  We do need to rule out pneumonia at this point and a chest Xray would be needed. We cannot order that through these services. I would suggest one of the urgent Care facilities below:    NOTE: There will be NO CHARGE for this eVisit   If you are having a true medical emergency please call 911.      For an urgent face to face visit, [facility] has seven urgent care centers for your convenience:     [facility] [place] [facility] [place] Get Driving Directions [phone] [place], [place] [address]    [place] [facility] [place] [facility]) Get Driving Directions [phone] [place], [place] [address]  [place] [facility] [place]) Get Driving Directions [phone] [address] [place],  [place]  [address]  [place] [facility] [place] [facility] - at [address] [place] Get Driving Directions  [phone] [address] [place],  [place] [address]   [facility] Get Driving Directions [phone] [place], [place], [place] [address]   [facility] Get Driving Directions  [phone] [address] [place], [place] [address]   [facility] [place] Get Driving Directions [phone] [address] [place], [place] [address]  Your MyChart E-visit questionnaire answers were reviewed by a board certified advanced clinical practitioner to complete your personal care plan based on your specific symptoms.  Thank you for using e-Visits.

## 2022-06-22 DIAGNOSIS — E1122 Type 2 diabetes mellitus with diabetic chronic kidney disease: Secondary | ICD-10-CM | POA: Diagnosis not present

## 2022-06-22 DIAGNOSIS — N186 End stage renal disease: Secondary | ICD-10-CM | POA: Diagnosis not present

## 2022-06-22 DIAGNOSIS — D509 Iron deficiency anemia, unspecified: Secondary | ICD-10-CM | POA: Diagnosis not present

## 2022-06-22 DIAGNOSIS — Z992 Dependence on renal dialysis: Secondary | ICD-10-CM | POA: Diagnosis not present

## 2022-06-22 DIAGNOSIS — D689 Coagulation defect, unspecified: Secondary | ICD-10-CM | POA: Diagnosis not present

## 2022-06-22 DIAGNOSIS — N2581 Secondary hyperparathyroidism of renal origin: Secondary | ICD-10-CM | POA: Diagnosis not present

## 2022-06-22 DIAGNOSIS — E876 Hypokalemia: Secondary | ICD-10-CM | POA: Diagnosis not present

## 2022-06-25 DIAGNOSIS — Z992 Dependence on renal dialysis: Secondary | ICD-10-CM | POA: Diagnosis not present

## 2022-06-25 DIAGNOSIS — D689 Coagulation defect, unspecified: Secondary | ICD-10-CM | POA: Diagnosis not present

## 2022-06-25 DIAGNOSIS — N2581 Secondary hyperparathyroidism of renal origin: Secondary | ICD-10-CM | POA: Diagnosis not present

## 2022-06-25 DIAGNOSIS — E876 Hypokalemia: Secondary | ICD-10-CM | POA: Diagnosis not present

## 2022-06-25 DIAGNOSIS — N186 End stage renal disease: Secondary | ICD-10-CM | POA: Diagnosis not present

## 2022-06-25 DIAGNOSIS — D509 Iron deficiency anemia, unspecified: Secondary | ICD-10-CM | POA: Diagnosis not present

## 2022-06-25 DIAGNOSIS — E1122 Type 2 diabetes mellitus with diabetic chronic kidney disease: Secondary | ICD-10-CM | POA: Diagnosis not present

## 2022-06-25 NOTE — Progress Notes (Signed)
Remote ICD transmission.   

## 2022-06-27 DIAGNOSIS — E876 Hypokalemia: Secondary | ICD-10-CM | POA: Diagnosis not present

## 2022-06-27 DIAGNOSIS — D689 Coagulation defect, unspecified: Secondary | ICD-10-CM | POA: Diagnosis not present

## 2022-06-27 DIAGNOSIS — Z992 Dependence on renal dialysis: Secondary | ICD-10-CM | POA: Diagnosis not present

## 2022-06-27 DIAGNOSIS — D509 Iron deficiency anemia, unspecified: Secondary | ICD-10-CM | POA: Diagnosis not present

## 2022-06-27 DIAGNOSIS — N2581 Secondary hyperparathyroidism of renal origin: Secondary | ICD-10-CM | POA: Diagnosis not present

## 2022-06-27 DIAGNOSIS — N186 End stage renal disease: Secondary | ICD-10-CM | POA: Diagnosis not present

## 2022-06-27 DIAGNOSIS — E1122 Type 2 diabetes mellitus with diabetic chronic kidney disease: Secondary | ICD-10-CM | POA: Diagnosis not present

## 2022-06-29 DIAGNOSIS — E876 Hypokalemia: Secondary | ICD-10-CM | POA: Diagnosis not present

## 2022-06-29 DIAGNOSIS — D509 Iron deficiency anemia, unspecified: Secondary | ICD-10-CM | POA: Diagnosis not present

## 2022-06-29 DIAGNOSIS — D689 Coagulation defect, unspecified: Secondary | ICD-10-CM | POA: Diagnosis not present

## 2022-06-29 DIAGNOSIS — N2581 Secondary hyperparathyroidism of renal origin: Secondary | ICD-10-CM | POA: Diagnosis not present

## 2022-06-29 DIAGNOSIS — E1122 Type 2 diabetes mellitus with diabetic chronic kidney disease: Secondary | ICD-10-CM | POA: Diagnosis not present

## 2022-06-29 DIAGNOSIS — Z992 Dependence on renal dialysis: Secondary | ICD-10-CM | POA: Diagnosis not present

## 2022-06-29 DIAGNOSIS — N186 End stage renal disease: Secondary | ICD-10-CM | POA: Diagnosis not present

## 2022-07-01 ENCOUNTER — Other Ambulatory Visit: Payer: Self-pay | Admitting: Family Medicine

## 2022-07-02 DIAGNOSIS — N186 End stage renal disease: Secondary | ICD-10-CM | POA: Diagnosis not present

## 2022-07-02 DIAGNOSIS — Z992 Dependence on renal dialysis: Secondary | ICD-10-CM | POA: Diagnosis not present

## 2022-07-02 DIAGNOSIS — E876 Hypokalemia: Secondary | ICD-10-CM | POA: Diagnosis not present

## 2022-07-02 DIAGNOSIS — E1122 Type 2 diabetes mellitus with diabetic chronic kidney disease: Secondary | ICD-10-CM | POA: Diagnosis not present

## 2022-07-02 DIAGNOSIS — D509 Iron deficiency anemia, unspecified: Secondary | ICD-10-CM | POA: Diagnosis not present

## 2022-07-02 DIAGNOSIS — N2581 Secondary hyperparathyroidism of renal origin: Secondary | ICD-10-CM | POA: Diagnosis not present

## 2022-07-02 DIAGNOSIS — D689 Coagulation defect, unspecified: Secondary | ICD-10-CM | POA: Diagnosis not present

## 2022-07-04 DIAGNOSIS — D509 Iron deficiency anemia, unspecified: Secondary | ICD-10-CM | POA: Diagnosis not present

## 2022-07-04 DIAGNOSIS — D689 Coagulation defect, unspecified: Secondary | ICD-10-CM | POA: Diagnosis not present

## 2022-07-04 DIAGNOSIS — E1122 Type 2 diabetes mellitus with diabetic chronic kidney disease: Secondary | ICD-10-CM | POA: Diagnosis not present

## 2022-07-04 DIAGNOSIS — E876 Hypokalemia: Secondary | ICD-10-CM | POA: Diagnosis not present

## 2022-07-04 DIAGNOSIS — Z992 Dependence on renal dialysis: Secondary | ICD-10-CM | POA: Diagnosis not present

## 2022-07-04 DIAGNOSIS — N186 End stage renal disease: Secondary | ICD-10-CM | POA: Diagnosis not present

## 2022-07-04 DIAGNOSIS — N2581 Secondary hyperparathyroidism of renal origin: Secondary | ICD-10-CM | POA: Diagnosis not present

## 2022-07-06 DIAGNOSIS — D689 Coagulation defect, unspecified: Secondary | ICD-10-CM | POA: Diagnosis not present

## 2022-07-06 DIAGNOSIS — Z992 Dependence on renal dialysis: Secondary | ICD-10-CM | POA: Diagnosis not present

## 2022-07-06 DIAGNOSIS — N2581 Secondary hyperparathyroidism of renal origin: Secondary | ICD-10-CM | POA: Diagnosis not present

## 2022-07-06 DIAGNOSIS — E1122 Type 2 diabetes mellitus with diabetic chronic kidney disease: Secondary | ICD-10-CM | POA: Diagnosis not present

## 2022-07-06 DIAGNOSIS — D509 Iron deficiency anemia, unspecified: Secondary | ICD-10-CM | POA: Diagnosis not present

## 2022-07-06 DIAGNOSIS — E876 Hypokalemia: Secondary | ICD-10-CM | POA: Diagnosis not present

## 2022-07-06 DIAGNOSIS — N186 End stage renal disease: Secondary | ICD-10-CM | POA: Diagnosis not present

## 2022-07-09 DIAGNOSIS — D689 Coagulation defect, unspecified: Secondary | ICD-10-CM | POA: Diagnosis not present

## 2022-07-09 DIAGNOSIS — E876 Hypokalemia: Secondary | ICD-10-CM | POA: Diagnosis not present

## 2022-07-09 DIAGNOSIS — D509 Iron deficiency anemia, unspecified: Secondary | ICD-10-CM | POA: Diagnosis not present

## 2022-07-09 DIAGNOSIS — E1122 Type 2 diabetes mellitus with diabetic chronic kidney disease: Secondary | ICD-10-CM | POA: Diagnosis not present

## 2022-07-09 DIAGNOSIS — Z992 Dependence on renal dialysis: Secondary | ICD-10-CM | POA: Diagnosis not present

## 2022-07-09 DIAGNOSIS — N2581 Secondary hyperparathyroidism of renal origin: Secondary | ICD-10-CM | POA: Diagnosis not present

## 2022-07-09 DIAGNOSIS — N186 End stage renal disease: Secondary | ICD-10-CM | POA: Diagnosis not present

## 2022-07-11 DIAGNOSIS — D509 Iron deficiency anemia, unspecified: Secondary | ICD-10-CM | POA: Diagnosis not present

## 2022-07-11 DIAGNOSIS — E876 Hypokalemia: Secondary | ICD-10-CM | POA: Diagnosis not present

## 2022-07-11 DIAGNOSIS — E1122 Type 2 diabetes mellitus with diabetic chronic kidney disease: Secondary | ICD-10-CM | POA: Diagnosis not present

## 2022-07-11 DIAGNOSIS — N186 End stage renal disease: Secondary | ICD-10-CM | POA: Diagnosis not present

## 2022-07-11 DIAGNOSIS — D689 Coagulation defect, unspecified: Secondary | ICD-10-CM | POA: Diagnosis not present

## 2022-07-11 DIAGNOSIS — Z992 Dependence on renal dialysis: Secondary | ICD-10-CM | POA: Diagnosis not present

## 2022-07-11 DIAGNOSIS — N2581 Secondary hyperparathyroidism of renal origin: Secondary | ICD-10-CM | POA: Diagnosis not present

## 2022-07-13 DIAGNOSIS — E876 Hypokalemia: Secondary | ICD-10-CM | POA: Diagnosis not present

## 2022-07-13 DIAGNOSIS — Z992 Dependence on renal dialysis: Secondary | ICD-10-CM | POA: Diagnosis not present

## 2022-07-13 DIAGNOSIS — N2581 Secondary hyperparathyroidism of renal origin: Secondary | ICD-10-CM | POA: Diagnosis not present

## 2022-07-13 DIAGNOSIS — N186 End stage renal disease: Secondary | ICD-10-CM | POA: Diagnosis not present

## 2022-07-13 DIAGNOSIS — D689 Coagulation defect, unspecified: Secondary | ICD-10-CM | POA: Diagnosis not present

## 2022-07-13 DIAGNOSIS — E1122 Type 2 diabetes mellitus with diabetic chronic kidney disease: Secondary | ICD-10-CM | POA: Diagnosis not present

## 2022-07-13 DIAGNOSIS — D509 Iron deficiency anemia, unspecified: Secondary | ICD-10-CM | POA: Diagnosis not present

## 2022-07-16 DIAGNOSIS — N186 End stage renal disease: Secondary | ICD-10-CM | POA: Diagnosis not present

## 2022-07-16 DIAGNOSIS — E876 Hypokalemia: Secondary | ICD-10-CM | POA: Diagnosis not present

## 2022-07-16 DIAGNOSIS — E1122 Type 2 diabetes mellitus with diabetic chronic kidney disease: Secondary | ICD-10-CM | POA: Diagnosis not present

## 2022-07-16 DIAGNOSIS — N2581 Secondary hyperparathyroidism of renal origin: Secondary | ICD-10-CM | POA: Diagnosis not present

## 2022-07-16 DIAGNOSIS — D689 Coagulation defect, unspecified: Secondary | ICD-10-CM | POA: Diagnosis not present

## 2022-07-16 DIAGNOSIS — Z992 Dependence on renal dialysis: Secondary | ICD-10-CM | POA: Diagnosis not present

## 2022-07-16 DIAGNOSIS — D509 Iron deficiency anemia, unspecified: Secondary | ICD-10-CM | POA: Diagnosis not present

## 2022-07-17 DIAGNOSIS — N186 End stage renal disease: Secondary | ICD-10-CM | POA: Diagnosis not present

## 2022-07-17 DIAGNOSIS — Z992 Dependence on renal dialysis: Secondary | ICD-10-CM | POA: Diagnosis not present

## 2022-07-17 DIAGNOSIS — I129 Hypertensive chronic kidney disease with stage 1 through stage 4 chronic kidney disease, or unspecified chronic kidney disease: Secondary | ICD-10-CM | POA: Diagnosis not present

## 2022-07-18 DIAGNOSIS — N2581 Secondary hyperparathyroidism of renal origin: Secondary | ICD-10-CM | POA: Diagnosis not present

## 2022-07-18 DIAGNOSIS — N186 End stage renal disease: Secondary | ICD-10-CM | POA: Diagnosis not present

## 2022-07-18 DIAGNOSIS — Z992 Dependence on renal dialysis: Secondary | ICD-10-CM | POA: Diagnosis not present

## 2022-07-18 DIAGNOSIS — D689 Coagulation defect, unspecified: Secondary | ICD-10-CM | POA: Diagnosis not present

## 2022-07-18 DIAGNOSIS — E876 Hypokalemia: Secondary | ICD-10-CM | POA: Diagnosis not present

## 2022-07-20 DIAGNOSIS — E876 Hypokalemia: Secondary | ICD-10-CM | POA: Diagnosis not present

## 2022-07-20 DIAGNOSIS — N2581 Secondary hyperparathyroidism of renal origin: Secondary | ICD-10-CM | POA: Diagnosis not present

## 2022-07-20 DIAGNOSIS — N186 End stage renal disease: Secondary | ICD-10-CM | POA: Diagnosis not present

## 2022-07-20 DIAGNOSIS — D689 Coagulation defect, unspecified: Secondary | ICD-10-CM | POA: Diagnosis not present

## 2022-07-20 DIAGNOSIS — Z992 Dependence on renal dialysis: Secondary | ICD-10-CM | POA: Diagnosis not present

## 2022-07-23 DIAGNOSIS — Z992 Dependence on renal dialysis: Secondary | ICD-10-CM | POA: Diagnosis not present

## 2022-07-23 DIAGNOSIS — N186 End stage renal disease: Secondary | ICD-10-CM | POA: Diagnosis not present

## 2022-07-23 DIAGNOSIS — N2581 Secondary hyperparathyroidism of renal origin: Secondary | ICD-10-CM | POA: Diagnosis not present

## 2022-07-23 DIAGNOSIS — E876 Hypokalemia: Secondary | ICD-10-CM | POA: Diagnosis not present

## 2022-07-23 DIAGNOSIS — D689 Coagulation defect, unspecified: Secondary | ICD-10-CM | POA: Diagnosis not present

## 2022-07-25 DIAGNOSIS — N2581 Secondary hyperparathyroidism of renal origin: Secondary | ICD-10-CM | POA: Diagnosis not present

## 2022-07-25 DIAGNOSIS — D689 Coagulation defect, unspecified: Secondary | ICD-10-CM | POA: Diagnosis not present

## 2022-07-25 DIAGNOSIS — N186 End stage renal disease: Secondary | ICD-10-CM | POA: Diagnosis not present

## 2022-07-25 DIAGNOSIS — E876 Hypokalemia: Secondary | ICD-10-CM | POA: Diagnosis not present

## 2022-07-25 DIAGNOSIS — Z992 Dependence on renal dialysis: Secondary | ICD-10-CM | POA: Diagnosis not present

## 2022-07-27 DIAGNOSIS — N2581 Secondary hyperparathyroidism of renal origin: Secondary | ICD-10-CM | POA: Diagnosis not present

## 2022-07-27 DIAGNOSIS — N186 End stage renal disease: Secondary | ICD-10-CM | POA: Diagnosis not present

## 2022-07-27 DIAGNOSIS — Z992 Dependence on renal dialysis: Secondary | ICD-10-CM | POA: Diagnosis not present

## 2022-07-27 DIAGNOSIS — E876 Hypokalemia: Secondary | ICD-10-CM | POA: Diagnosis not present

## 2022-07-27 DIAGNOSIS — D689 Coagulation defect, unspecified: Secondary | ICD-10-CM | POA: Diagnosis not present

## 2022-07-30 DIAGNOSIS — E876 Hypokalemia: Secondary | ICD-10-CM | POA: Diagnosis not present

## 2022-07-30 DIAGNOSIS — N2581 Secondary hyperparathyroidism of renal origin: Secondary | ICD-10-CM | POA: Diagnosis not present

## 2022-07-30 DIAGNOSIS — D689 Coagulation defect, unspecified: Secondary | ICD-10-CM | POA: Diagnosis not present

## 2022-07-30 DIAGNOSIS — Z992 Dependence on renal dialysis: Secondary | ICD-10-CM | POA: Diagnosis not present

## 2022-07-30 DIAGNOSIS — N186 End stage renal disease: Secondary | ICD-10-CM | POA: Diagnosis not present

## 2022-08-01 DIAGNOSIS — N2581 Secondary hyperparathyroidism of renal origin: Secondary | ICD-10-CM | POA: Diagnosis not present

## 2022-08-01 DIAGNOSIS — D689 Coagulation defect, unspecified: Secondary | ICD-10-CM | POA: Diagnosis not present

## 2022-08-01 DIAGNOSIS — Z992 Dependence on renal dialysis: Secondary | ICD-10-CM | POA: Diagnosis not present

## 2022-08-01 DIAGNOSIS — E876 Hypokalemia: Secondary | ICD-10-CM | POA: Diagnosis not present

## 2022-08-01 DIAGNOSIS — N186 End stage renal disease: Secondary | ICD-10-CM | POA: Diagnosis not present

## 2022-08-03 ENCOUNTER — Other Ambulatory Visit: Payer: Self-pay | Admitting: Family Medicine

## 2022-08-03 DIAGNOSIS — D689 Coagulation defect, unspecified: Secondary | ICD-10-CM | POA: Diagnosis not present

## 2022-08-03 DIAGNOSIS — Z992 Dependence on renal dialysis: Secondary | ICD-10-CM | POA: Diagnosis not present

## 2022-08-03 DIAGNOSIS — N2581 Secondary hyperparathyroidism of renal origin: Secondary | ICD-10-CM | POA: Diagnosis not present

## 2022-08-03 DIAGNOSIS — E876 Hypokalemia: Secondary | ICD-10-CM | POA: Diagnosis not present

## 2022-08-03 DIAGNOSIS — N186 End stage renal disease: Secondary | ICD-10-CM | POA: Diagnosis not present

## 2022-08-06 DIAGNOSIS — D689 Coagulation defect, unspecified: Secondary | ICD-10-CM | POA: Diagnosis not present

## 2022-08-06 DIAGNOSIS — E876 Hypokalemia: Secondary | ICD-10-CM | POA: Diagnosis not present

## 2022-08-06 DIAGNOSIS — Z992 Dependence on renal dialysis: Secondary | ICD-10-CM | POA: Diagnosis not present

## 2022-08-06 DIAGNOSIS — N186 End stage renal disease: Secondary | ICD-10-CM | POA: Diagnosis not present

## 2022-08-06 DIAGNOSIS — N2581 Secondary hyperparathyroidism of renal origin: Secondary | ICD-10-CM | POA: Diagnosis not present

## 2022-08-08 DIAGNOSIS — Z992 Dependence on renal dialysis: Secondary | ICD-10-CM | POA: Diagnosis not present

## 2022-08-08 DIAGNOSIS — E876 Hypokalemia: Secondary | ICD-10-CM | POA: Diagnosis not present

## 2022-08-08 DIAGNOSIS — D689 Coagulation defect, unspecified: Secondary | ICD-10-CM | POA: Diagnosis not present

## 2022-08-08 DIAGNOSIS — N186 End stage renal disease: Secondary | ICD-10-CM | POA: Diagnosis not present

## 2022-08-08 DIAGNOSIS — N2581 Secondary hyperparathyroidism of renal origin: Secondary | ICD-10-CM | POA: Diagnosis not present

## 2022-08-10 DIAGNOSIS — N186 End stage renal disease: Secondary | ICD-10-CM | POA: Diagnosis not present

## 2022-08-10 DIAGNOSIS — D689 Coagulation defect, unspecified: Secondary | ICD-10-CM | POA: Diagnosis not present

## 2022-08-10 DIAGNOSIS — Z992 Dependence on renal dialysis: Secondary | ICD-10-CM | POA: Diagnosis not present

## 2022-08-10 DIAGNOSIS — N2581 Secondary hyperparathyroidism of renal origin: Secondary | ICD-10-CM | POA: Diagnosis not present

## 2022-08-10 DIAGNOSIS — E876 Hypokalemia: Secondary | ICD-10-CM | POA: Diagnosis not present

## 2022-08-13 DIAGNOSIS — N186 End stage renal disease: Secondary | ICD-10-CM | POA: Diagnosis not present

## 2022-08-13 DIAGNOSIS — E876 Hypokalemia: Secondary | ICD-10-CM | POA: Diagnosis not present

## 2022-08-13 DIAGNOSIS — N2581 Secondary hyperparathyroidism of renal origin: Secondary | ICD-10-CM | POA: Diagnosis not present

## 2022-08-13 DIAGNOSIS — Z992 Dependence on renal dialysis: Secondary | ICD-10-CM | POA: Diagnosis not present

## 2022-08-13 DIAGNOSIS — D689 Coagulation defect, unspecified: Secondary | ICD-10-CM | POA: Diagnosis not present

## 2022-08-15 DIAGNOSIS — E876 Hypokalemia: Secondary | ICD-10-CM | POA: Diagnosis not present

## 2022-08-15 DIAGNOSIS — N186 End stage renal disease: Secondary | ICD-10-CM | POA: Diagnosis not present

## 2022-08-15 DIAGNOSIS — N2581 Secondary hyperparathyroidism of renal origin: Secondary | ICD-10-CM | POA: Diagnosis not present

## 2022-08-15 DIAGNOSIS — Z992 Dependence on renal dialysis: Secondary | ICD-10-CM | POA: Diagnosis not present

## 2022-08-15 DIAGNOSIS — D689 Coagulation defect, unspecified: Secondary | ICD-10-CM | POA: Diagnosis not present

## 2022-08-15 DIAGNOSIS — I129 Hypertensive chronic kidney disease with stage 1 through stage 4 chronic kidney disease, or unspecified chronic kidney disease: Secondary | ICD-10-CM | POA: Diagnosis not present

## 2022-08-16 ENCOUNTER — Other Ambulatory Visit: Payer: Self-pay | Admitting: Family Medicine

## 2022-08-17 DIAGNOSIS — N186 End stage renal disease: Secondary | ICD-10-CM | POA: Diagnosis not present

## 2022-08-17 DIAGNOSIS — N2581 Secondary hyperparathyroidism of renal origin: Secondary | ICD-10-CM | POA: Diagnosis not present

## 2022-08-17 DIAGNOSIS — D689 Coagulation defect, unspecified: Secondary | ICD-10-CM | POA: Diagnosis not present

## 2022-08-17 DIAGNOSIS — E876 Hypokalemia: Secondary | ICD-10-CM | POA: Diagnosis not present

## 2022-08-17 DIAGNOSIS — Z992 Dependence on renal dialysis: Secondary | ICD-10-CM | POA: Diagnosis not present

## 2022-08-20 DIAGNOSIS — Z992 Dependence on renal dialysis: Secondary | ICD-10-CM | POA: Diagnosis not present

## 2022-08-20 DIAGNOSIS — N2581 Secondary hyperparathyroidism of renal origin: Secondary | ICD-10-CM | POA: Diagnosis not present

## 2022-08-20 DIAGNOSIS — N186 End stage renal disease: Secondary | ICD-10-CM | POA: Diagnosis not present

## 2022-08-20 DIAGNOSIS — D689 Coagulation defect, unspecified: Secondary | ICD-10-CM | POA: Diagnosis not present

## 2022-08-20 DIAGNOSIS — E876 Hypokalemia: Secondary | ICD-10-CM | POA: Diagnosis not present

## 2022-08-22 DIAGNOSIS — N186 End stage renal disease: Secondary | ICD-10-CM | POA: Diagnosis not present

## 2022-08-22 DIAGNOSIS — E876 Hypokalemia: Secondary | ICD-10-CM | POA: Diagnosis not present

## 2022-08-22 DIAGNOSIS — N2581 Secondary hyperparathyroidism of renal origin: Secondary | ICD-10-CM | POA: Diagnosis not present

## 2022-08-22 DIAGNOSIS — D689 Coagulation defect, unspecified: Secondary | ICD-10-CM | POA: Diagnosis not present

## 2022-08-22 DIAGNOSIS — Z992 Dependence on renal dialysis: Secondary | ICD-10-CM | POA: Diagnosis not present

## 2022-08-24 DIAGNOSIS — D689 Coagulation defect, unspecified: Secondary | ICD-10-CM | POA: Diagnosis not present

## 2022-08-24 DIAGNOSIS — N2581 Secondary hyperparathyroidism of renal origin: Secondary | ICD-10-CM | POA: Diagnosis not present

## 2022-08-24 DIAGNOSIS — N186 End stage renal disease: Secondary | ICD-10-CM | POA: Diagnosis not present

## 2022-08-24 DIAGNOSIS — Z992 Dependence on renal dialysis: Secondary | ICD-10-CM | POA: Diagnosis not present

## 2022-08-24 DIAGNOSIS — E876 Hypokalemia: Secondary | ICD-10-CM | POA: Diagnosis not present

## 2022-08-26 ENCOUNTER — Telehealth: Payer: Self-pay | Admitting: Pharmacist

## 2022-08-26 NOTE — Progress Notes (Signed)
Care Management & Coordination Services Pharmacy Team   Reason for Encounter: Hypertension   Contacted patient to discuss hypertension disease state. Unsuccessful outreach. Left voicemail for patient to return call.    Current antihypertensive regimen:  Furosemide (LASIX) 20 MG tablet   Patient verbally confirms he is taking the above medications as directed.   How often are you checking your Blood Pressure?   he checks his blood pressure   taking his medication.  Current home BP readings:   DATE:             BP               PULSE   Wrist or arm cuff: Caffeine intake: Salt intake: OTC medications including pseudoephedrine or NSAIDs?  Any readings above 180/100?  If yes any symptoms of hypertensive emergency?   What recent interventions/DTPs have been made by any provider to improve Blood Pressure control since last CPP Visit:  Patient denied any recent medication changes since last visit with CPP  Any recent hospitalizations or ED visits since last visit with CPP? Yes  What diet changes have been made to improve Blood Pressure Control?    What exercise is being done to improve your Blood Pressure Control?    Adherence Review: Is the patient currently on ACE/ARB medication? No Does the patient have >5 day gap between last estimated fill dates? No  Star Rating Drugs:  Medication:   Last Fill: Day Supply  Atorvastatin 20 MG tablet  08/25/22 90   Chart Updates: Recent office visits:  06/05/22 [name], MD - Family Medicine - Diabetes - Labs were ordered. Referral to sleep study placed. Sertraline (ZOLOFT) 100 MG tablet  prescribed.   Recent consult visits:  [id number] [facility] - ESRD - No notes available.   [id number] [facility] - ESRD - No notes available.   Hospital visits: 05/31/22 - 06/04/22 Medication Reconciliation was completed by comparing discharge summary, patient's EMR and Pharmacy list, and upon discussion with  patient.  Admitted to the hospital on 05/31/22 due to Leg swelling. Discharge date was 06/04/22. Discharged from [facility]?Medications Started at [facility] Discharge:?? doxycycline  Medication Changes at Hospital Discharge: None noted  Medications Discontinued at Hospital Discharge: None noted  Medications that remain the same after Hospital Discharge:??  All other medications will remain the same.    Medications: Outpatient Encounter Medications as of 08/26/2022  Medication Sig   allopurinol (ZYLOPRIM) 100 MG tablet TAKE 1 TABLET BY MOUTH 3 TIMES A WEEK ON MONDAY, WEDNESDAY AND FRIDAY   atorvastatin (LIPITOR) 20 MG tablet Take 1 tablet (20 mg total) by mouth daily.   donepezil (ARICEPT) 10 MG tablet TAKE 1 TABLET BY MOUTH EVERYDAY AT BEDTIME   doxycycline (VIBRAMYCIN) 100 MG capsule Take 1 capsule (100 mg total) by mouth 2 (two) times daily.   furosemide (LASIX) 20 MG tablet TAKE 1 TABLET BY MOUTH EVERY DAY   ipratropium (ATROVENT) 0.06 % nasal spray Place 1-2 sprays into both nostrils 4 (four) times daily. As needed for nasal congestion   levothyroxine (SYNTHROID) 50 MCG tablet Take 1 tablet (50 mcg total) by mouth daily.   loratadine (CLARITIN) 10 MG tablet Take 1 tablet (10 mg total) by mouth daily.   Multiple Vitamins-Minerals (MULTIVITAMIN WITH MINERALS) tablet Take 1 tablet by mouth daily.   omeprazole (PRILOSEC) 40 MG capsule TAKE 1 CAPSULE BY MOUTH EVERY DAY   ondansetron (ZOFRAN-ODT) 4 MG disintegrating tablet 4mg  ODT  q4 hours prn nausea/vomit   sertraline (ZOLOFT) 100 MG tablet TAKE 1 TABLET BY MOUTH EVERY DAY   No facility-administered encounter medications on file as of 08/26/2022.    Recent Office Vitals: BP Readings from Last 3 Encounters:  06/05/22 136/60  05/31/22 110/71  04/26/22 107/71   Pulse Readings from Last 3 Encounters:  06/05/22 79  05/31/22 95  04/26/22 98    Wt Readings from Last 3 Encounters:  06/05/22 179 lb (81.2 kg)   05/31/22 177 lb 11.1 oz (80.6 kg)  04/26/22 177 lb 9.6 oz (80.6 kg)     Kidney Function Lab Results  Component Value Date/Time   CREATININE 5.59 (HH) 06/05/2022 11:18 AM   CREATININE 5.44 (H) 05/31/2022 03:49 PM   CREATININE 3.63 (H) 07/04/2017 03:55 PM   GFR 9.04 (LL) 06/05/2022 11:18 AM   GFRNONAA 10 (L) 05/31/2022 03:49 PM   GFRAA 26 (L) 09/11/2019 02:02 PM       Latest Ref Rng & Units 06/05/2022   11:18 AM 05/31/2022    3:49 PM 04/22/2022   11:14 AM  BMP  Glucose 70 - 99 mg/dL 165  175  161   BUN 6 - 23 mg/dL 35  34  45   Creatinine 0.40 - 1.50 mg/dL 5.59  5.44  6.39   Sodium 135 - 145 mEq/L 139  137  136   Potassium 3.5 - 5.1 mEq/L 4.4  4.4  4.0   Chloride 96 - 112 mEq/L 95  94  95   CO2 19 - 32 mEq/L 31  27  25    Calcium 8.4 - 10.5 mg/dL 9.5  9.1  8.6      Future Appointments  Date Time Provider [place]  08/28/2022  7:00 AM [facility] DEVICE REMOTES [facility]  10/25/2022  1:45 PM [name], MD [facility]  11/27/2022  7:00 AM [facility] DEVICE REMOTES [facility]  12/05/2022 10:00 AM [name], MD [facility]  12/30/2022 11:45 AM Alric Ran, MD [facility] None  02/26/2023  7:00 AM [facility] DEVICE REMOTES [facility]  05/28/2023  7:00 AM [facility] DEVICE REMOTES [facility]  08/27/2023  7:00 AM [facility] DEVICE REMOTES [facility]    [facility]

## 2022-08-27 DIAGNOSIS — E876 Hypokalemia: Secondary | ICD-10-CM | POA: Diagnosis not present

## 2022-08-27 DIAGNOSIS — Z992 Dependence on renal dialysis: Secondary | ICD-10-CM | POA: Diagnosis not present

## 2022-08-27 DIAGNOSIS — D689 Coagulation defect, unspecified: Secondary | ICD-10-CM | POA: Diagnosis not present

## 2022-08-27 DIAGNOSIS — N2581 Secondary hyperparathyroidism of renal origin: Secondary | ICD-10-CM | POA: Diagnosis not present

## 2022-08-27 DIAGNOSIS — N186 End stage renal disease: Secondary | ICD-10-CM | POA: Diagnosis not present

## 2022-08-28 ENCOUNTER — Ambulatory Visit (INDEPENDENT_AMBULATORY_CARE_PROVIDER_SITE_OTHER): Payer: Medicare HMO

## 2022-08-28 DIAGNOSIS — I428 Other cardiomyopathies: Secondary | ICD-10-CM

## 2022-08-29 DIAGNOSIS — E876 Hypokalemia: Secondary | ICD-10-CM | POA: Diagnosis not present

## 2022-08-29 DIAGNOSIS — N2581 Secondary hyperparathyroidism of renal origin: Secondary | ICD-10-CM | POA: Diagnosis not present

## 2022-08-29 DIAGNOSIS — N186 End stage renal disease: Secondary | ICD-10-CM | POA: Diagnosis not present

## 2022-08-29 DIAGNOSIS — Z992 Dependence on renal dialysis: Secondary | ICD-10-CM | POA: Diagnosis not present

## 2022-08-29 DIAGNOSIS — D689 Coagulation defect, unspecified: Secondary | ICD-10-CM | POA: Diagnosis not present

## 2022-08-29 LAB — CUP PACEART REMOTE DEVICE CHECK
Battery Remaining Longevity: 156 mo
Battery Remaining Percentage: 100 %
Brady Statistic RA Percent Paced: 0 %
Brady Statistic RV Percent Paced: 1 %
Date Time Interrogation Session: 20240313050200
HighPow Impedance: 35 Ohm
Implantable Lead Connection Status: 753985
Implantable Lead Connection Status: 753985
Implantable Lead Implant Date: 20091218
Implantable Lead Implant Date: 20091218
Implantable Lead Location: 753859
Implantable Lead Location: 753860
Implantable Lead Model: 158
Implantable Lead Serial Number: 131093
Implantable Pulse Generator Implant Date: 20220523
Lead Channel Impedance Value: 332 Ohm
Lead Channel Impedance Value: 435 Ohm
Lead Channel Setting Pacing Amplitude: 2 V
Lead Channel Setting Pacing Amplitude: 2.4 V
Lead Channel Setting Pacing Pulse Width: 0.4 ms
Lead Channel Setting Sensing Sensitivity: 0.5 mV
Pulse Gen Serial Number: 232629

## 2022-08-31 DIAGNOSIS — N186 End stage renal disease: Secondary | ICD-10-CM | POA: Diagnosis not present

## 2022-08-31 DIAGNOSIS — D689 Coagulation defect, unspecified: Secondary | ICD-10-CM | POA: Diagnosis not present

## 2022-08-31 DIAGNOSIS — Z992 Dependence on renal dialysis: Secondary | ICD-10-CM | POA: Diagnosis not present

## 2022-08-31 DIAGNOSIS — N2581 Secondary hyperparathyroidism of renal origin: Secondary | ICD-10-CM | POA: Diagnosis not present

## 2022-08-31 DIAGNOSIS — E876 Hypokalemia: Secondary | ICD-10-CM | POA: Diagnosis not present

## 2022-09-03 DIAGNOSIS — Z992 Dependence on renal dialysis: Secondary | ICD-10-CM | POA: Diagnosis not present

## 2022-09-03 DIAGNOSIS — D689 Coagulation defect, unspecified: Secondary | ICD-10-CM | POA: Diagnosis not present

## 2022-09-03 DIAGNOSIS — E876 Hypokalemia: Secondary | ICD-10-CM | POA: Diagnosis not present

## 2022-09-03 DIAGNOSIS — N186 End stage renal disease: Secondary | ICD-10-CM | POA: Diagnosis not present

## 2022-09-03 DIAGNOSIS — N2581 Secondary hyperparathyroidism of renal origin: Secondary | ICD-10-CM | POA: Diagnosis not present

## 2022-09-05 DIAGNOSIS — D689 Coagulation defect, unspecified: Secondary | ICD-10-CM | POA: Diagnosis not present

## 2022-09-05 DIAGNOSIS — Z992 Dependence on renal dialysis: Secondary | ICD-10-CM | POA: Diagnosis not present

## 2022-09-05 DIAGNOSIS — N186 End stage renal disease: Secondary | ICD-10-CM | POA: Diagnosis not present

## 2022-09-05 DIAGNOSIS — E876 Hypokalemia: Secondary | ICD-10-CM | POA: Diagnosis not present

## 2022-09-05 DIAGNOSIS — N2581 Secondary hyperparathyroidism of renal origin: Secondary | ICD-10-CM | POA: Diagnosis not present

## 2022-09-07 DIAGNOSIS — N2581 Secondary hyperparathyroidism of renal origin: Secondary | ICD-10-CM | POA: Diagnosis not present

## 2022-09-07 DIAGNOSIS — Z992 Dependence on renal dialysis: Secondary | ICD-10-CM | POA: Diagnosis not present

## 2022-09-07 DIAGNOSIS — N186 End stage renal disease: Secondary | ICD-10-CM | POA: Diagnosis not present

## 2022-09-07 DIAGNOSIS — E876 Hypokalemia: Secondary | ICD-10-CM | POA: Diagnosis not present

## 2022-09-07 DIAGNOSIS — D689 Coagulation defect, unspecified: Secondary | ICD-10-CM | POA: Diagnosis not present

## 2022-09-10 DIAGNOSIS — N186 End stage renal disease: Secondary | ICD-10-CM | POA: Diagnosis not present

## 2022-09-10 DIAGNOSIS — Z992 Dependence on renal dialysis: Secondary | ICD-10-CM | POA: Diagnosis not present

## 2022-09-10 DIAGNOSIS — E876 Hypokalemia: Secondary | ICD-10-CM | POA: Diagnosis not present

## 2022-09-10 DIAGNOSIS — D689 Coagulation defect, unspecified: Secondary | ICD-10-CM | POA: Diagnosis not present

## 2022-09-10 DIAGNOSIS — N2581 Secondary hyperparathyroidism of renal origin: Secondary | ICD-10-CM | POA: Diagnosis not present

## 2022-09-12 DIAGNOSIS — E876 Hypokalemia: Secondary | ICD-10-CM | POA: Diagnosis not present

## 2022-09-12 DIAGNOSIS — Z992 Dependence on renal dialysis: Secondary | ICD-10-CM | POA: Diagnosis not present

## 2022-09-12 DIAGNOSIS — N186 End stage renal disease: Secondary | ICD-10-CM | POA: Diagnosis not present

## 2022-09-12 DIAGNOSIS — N2581 Secondary hyperparathyroidism of renal origin: Secondary | ICD-10-CM | POA: Diagnosis not present

## 2022-09-12 DIAGNOSIS — D689 Coagulation defect, unspecified: Secondary | ICD-10-CM | POA: Diagnosis not present

## 2022-09-14 DIAGNOSIS — Z992 Dependence on renal dialysis: Secondary | ICD-10-CM | POA: Diagnosis not present

## 2022-09-14 DIAGNOSIS — N186 End stage renal disease: Secondary | ICD-10-CM | POA: Diagnosis not present

## 2022-09-14 DIAGNOSIS — E876 Hypokalemia: Secondary | ICD-10-CM | POA: Diagnosis not present

## 2022-09-14 DIAGNOSIS — D689 Coagulation defect, unspecified: Secondary | ICD-10-CM | POA: Diagnosis not present

## 2022-09-14 DIAGNOSIS — N2581 Secondary hyperparathyroidism of renal origin: Secondary | ICD-10-CM | POA: Diagnosis not present

## 2022-09-15 DIAGNOSIS — I129 Hypertensive chronic kidney disease with stage 1 through stage 4 chronic kidney disease, or unspecified chronic kidney disease: Secondary | ICD-10-CM | POA: Diagnosis not present

## 2022-09-15 DIAGNOSIS — N186 End stage renal disease: Secondary | ICD-10-CM | POA: Diagnosis not present

## 2022-09-15 DIAGNOSIS — Z992 Dependence on renal dialysis: Secondary | ICD-10-CM | POA: Diagnosis not present

## 2022-09-17 DIAGNOSIS — D631 Anemia in chronic kidney disease: Secondary | ICD-10-CM | POA: Diagnosis not present

## 2022-09-17 DIAGNOSIS — R52 Pain, unspecified: Secondary | ICD-10-CM | POA: Diagnosis not present

## 2022-09-17 DIAGNOSIS — E1122 Type 2 diabetes mellitus with diabetic chronic kidney disease: Secondary | ICD-10-CM | POA: Diagnosis not present

## 2022-09-17 DIAGNOSIS — N2581 Secondary hyperparathyroidism of renal origin: Secondary | ICD-10-CM | POA: Diagnosis not present

## 2022-09-17 DIAGNOSIS — Z992 Dependence on renal dialysis: Secondary | ICD-10-CM | POA: Diagnosis not present

## 2022-09-17 DIAGNOSIS — N186 End stage renal disease: Secondary | ICD-10-CM | POA: Diagnosis not present

## 2022-09-17 DIAGNOSIS — E876 Hypokalemia: Secondary | ICD-10-CM | POA: Diagnosis not present

## 2022-09-17 DIAGNOSIS — D689 Coagulation defect, unspecified: Secondary | ICD-10-CM | POA: Diagnosis not present

## 2022-09-19 DIAGNOSIS — R52 Pain, unspecified: Secondary | ICD-10-CM | POA: Diagnosis not present

## 2022-09-19 DIAGNOSIS — D631 Anemia in chronic kidney disease: Secondary | ICD-10-CM | POA: Diagnosis not present

## 2022-09-19 DIAGNOSIS — E1122 Type 2 diabetes mellitus with diabetic chronic kidney disease: Secondary | ICD-10-CM | POA: Diagnosis not present

## 2022-09-19 DIAGNOSIS — Z992 Dependence on renal dialysis: Secondary | ICD-10-CM | POA: Diagnosis not present

## 2022-09-19 DIAGNOSIS — N2581 Secondary hyperparathyroidism of renal origin: Secondary | ICD-10-CM | POA: Diagnosis not present

## 2022-09-19 DIAGNOSIS — D689 Coagulation defect, unspecified: Secondary | ICD-10-CM | POA: Diagnosis not present

## 2022-09-19 DIAGNOSIS — N186 End stage renal disease: Secondary | ICD-10-CM | POA: Diagnosis not present

## 2022-09-19 DIAGNOSIS — E876 Hypokalemia: Secondary | ICD-10-CM | POA: Diagnosis not present

## 2022-09-21 DIAGNOSIS — Z992 Dependence on renal dialysis: Secondary | ICD-10-CM | POA: Diagnosis not present

## 2022-09-21 DIAGNOSIS — N186 End stage renal disease: Secondary | ICD-10-CM | POA: Diagnosis not present

## 2022-09-21 DIAGNOSIS — D631 Anemia in chronic kidney disease: Secondary | ICD-10-CM | POA: Diagnosis not present

## 2022-09-21 DIAGNOSIS — E1122 Type 2 diabetes mellitus with diabetic chronic kidney disease: Secondary | ICD-10-CM | POA: Diagnosis not present

## 2022-09-21 DIAGNOSIS — N2581 Secondary hyperparathyroidism of renal origin: Secondary | ICD-10-CM | POA: Diagnosis not present

## 2022-09-21 DIAGNOSIS — E876 Hypokalemia: Secondary | ICD-10-CM | POA: Diagnosis not present

## 2022-09-21 DIAGNOSIS — R52 Pain, unspecified: Secondary | ICD-10-CM | POA: Diagnosis not present

## 2022-09-21 DIAGNOSIS — D689 Coagulation defect, unspecified: Secondary | ICD-10-CM | POA: Diagnosis not present

## 2022-09-24 DIAGNOSIS — R52 Pain, unspecified: Secondary | ICD-10-CM | POA: Diagnosis not present

## 2022-09-24 DIAGNOSIS — Z992 Dependence on renal dialysis: Secondary | ICD-10-CM | POA: Diagnosis not present

## 2022-09-24 DIAGNOSIS — D631 Anemia in chronic kidney disease: Secondary | ICD-10-CM | POA: Diagnosis not present

## 2022-09-24 DIAGNOSIS — E876 Hypokalemia: Secondary | ICD-10-CM | POA: Diagnosis not present

## 2022-09-24 DIAGNOSIS — N2581 Secondary hyperparathyroidism of renal origin: Secondary | ICD-10-CM | POA: Diagnosis not present

## 2022-09-24 DIAGNOSIS — D689 Coagulation defect, unspecified: Secondary | ICD-10-CM | POA: Diagnosis not present

## 2022-09-24 DIAGNOSIS — N186 End stage renal disease: Secondary | ICD-10-CM | POA: Diagnosis not present

## 2022-09-24 DIAGNOSIS — E1122 Type 2 diabetes mellitus with diabetic chronic kidney disease: Secondary | ICD-10-CM | POA: Diagnosis not present

## 2022-09-25 ENCOUNTER — Telehealth: Payer: Self-pay | Admitting: Pharmacist

## 2022-09-25 NOTE — Progress Notes (Signed)
Care Management & Coordination Services Pharmacy Team   Reason for Encounter: Hypertension   Contacted patient to discuss hypertension disease state. Spoke with patient on 09/25/2022 Spoke to patient's son.     Current antihypertensive regimen:  furosemide (LASIX) 20 MG tablet   Patient verbally confirms he is taking the above medications as directed. Yes  How often are you checking your Blood Pressure?  Patient gets his blood pressure checked dialysis.   he checks his blood pressure  during dialysis treatments  after taking his medication.  Current home BP readings: none to report at the time of the call but denies any concerns.   DATE:             BP               PULSE   Wrist or arm cuff: arm Caffeine intake: Salt intake: OTC medications including pseudoephedrine or NSAIDs?  Any readings above 180/100? No If yes any symptoms of hypertensive emergency? patient denies any symptoms of high blood pressure  What recent interventions/DTPs have been made by any provider to improve Blood Pressure control since last CPP Visit:  Patient has not had any medication changes since last visit with CPP   Any recent hospitalizations or ED visits since last visit with CPP? No  What diet changes have been made to improve Blood Pressure Control?  Patient reported  What exercise is being done to improve your Blood Pressure Control?  Patient reported  Adherence Review: Is the patient currently on ACE/ARB medication? No Does the patient have >5 day gap between last estimated fill dates? No  Star Rating Drugs:  Medication:   Last Fill: Day Supply  Atorvastatin 20mg   08/25/22 90  Chart Updates: Recent office visits:  None noted.   Recent consult visits:  None noted.   Hospital visits:  None in previous 6 months  Medications: Outpatient Encounter Medications as of 09/25/2022  Medication Sig   allopurinol (ZYLOPRIM) 100 MG tablet TAKE 1 TABLET BY MOUTH 3 TIMES A WEEK ON MONDAY,  WEDNESDAY AND FRIDAY   atorvastatin (LIPITOR) 20 MG tablet Take 1 tablet (20 mg total) by mouth daily.   donepezil (ARICEPT) 10 MG tablet TAKE 1 TABLET BY MOUTH EVERYDAY AT BEDTIME   doxycycline (VIBRAMYCIN) 100 MG capsule Take 1 capsule (100 mg total) by mouth 2 (two) times daily.   furosemide (LASIX) 20 MG tablet TAKE 1 TABLET BY MOUTH EVERY DAY   ipratropium (ATROVENT) 0.06 % nasal spray Place 1-2 sprays into both nostrils 4 (four) times daily. As needed for nasal congestion   levothyroxine (SYNTHROID) 50 MCG tablet Take 1 tablet (50 mcg total) by mouth daily.   loratadine (CLARITIN) 10 MG tablet Take 1 tablet (10 mg total) by mouth daily.   Multiple Vitamins-Minerals (MULTIVITAMIN WITH MINERALS) tablet Take 1 tablet by mouth daily.   omeprazole (PRILOSEC) 40 MG capsule TAKE 1 CAPSULE BY MOUTH EVERY DAY   ondansetron (ZOFRAN-ODT) 4 MG disintegrating tablet 4mg  ODT q4 hours prn nausea/vomit   sertraline (ZOLOFT) 100 MG tablet TAKE 1 TABLET BY MOUTH EVERY DAY   No facility-administered encounter medications on file as of 09/25/2022.    Recent Office Vitals: BP Readings from Last 3 Encounters:  06/05/22 136/60  05/31/22 110/71  04/26/22 107/71   Pulse Readings from Last 3 Encounters:  06/05/22 79  05/31/22 95  04/26/22 98    Wt Readings from Last 3 Encounters:  06/05/22 179 lb (81.2 kg)  05/31/22 177 lb 11.1  oz (80.6 kg)  04/26/22 177 lb 9.6 oz (80.6 kg)     Kidney Function Lab Results  Component Value Date/Time   CREATININE 5.59 (HH) 06/05/2022 11:18 AM   CREATININE 5.44 (H) 05/31/2022 03:49 PM   CREATININE 3.63 (H) 07/04/2017 03:55 PM   GFR 9.04 (LL) 06/05/2022 11:18 AM   GFRNONAA 10 (L) 05/31/2022 03:49 PM   GFRAA 26 (L) 09/11/2019 02:02 PM       Latest Ref Rng & Units 06/05/2022   11:18 AM 05/31/2022    3:49 PM 04/22/2022   11:14 AM  BMP  Glucose 70 - 99 mg/dL 338  250  539   BUN 6 - 23 mg/dL 35  34  45   Creatinine 0.40 - 1.50 mg/dL 7.67  3.41  9.37   Sodium  135 - 145 mEq/L 139  137  136   Potassium 3.5 - 5.1 mEq/L 4.4  4.4  4.0   Chloride 96 - 112 mEq/L 95  94  95   CO2 19 - 32 mEq/L 31  27  25    Calcium 8.4 - 10.5 mg/dL 9.5  9.1  8.6     Future Appointments  Date Time Provider Department Center  10/25/2022  1:45 PM Duke Salvia, MD [facility]  11/27/2022  7:00 AM [facility] DEVICE REMOTES [facility]  12/05/2022 10:00 AM Sheliah Hatch, MD [facility]  12/30/2022 11:45 AM Windell Norfolk, MD [facility] None  02/26/2023  7:00 AM [facility] DEVICE REMOTES [facility]  05/28/2023  7:00 AM [facility] DEVICE REMOTES [facility]  08/27/2023  7:00 AM [facility] DEVICE REMOTES [facility]    [employer]

## 2022-09-26 DIAGNOSIS — Z992 Dependence on renal dialysis: Secondary | ICD-10-CM | POA: Diagnosis not present

## 2022-09-26 DIAGNOSIS — D689 Coagulation defect, unspecified: Secondary | ICD-10-CM | POA: Diagnosis not present

## 2022-09-26 DIAGNOSIS — E1122 Type 2 diabetes mellitus with diabetic chronic kidney disease: Secondary | ICD-10-CM | POA: Diagnosis not present

## 2022-09-26 DIAGNOSIS — N2581 Secondary hyperparathyroidism of renal origin: Secondary | ICD-10-CM | POA: Diagnosis not present

## 2022-09-26 DIAGNOSIS — R52 Pain, unspecified: Secondary | ICD-10-CM | POA: Diagnosis not present

## 2022-09-26 DIAGNOSIS — N186 End stage renal disease: Secondary | ICD-10-CM | POA: Diagnosis not present

## 2022-09-26 DIAGNOSIS — D631 Anemia in chronic kidney disease: Secondary | ICD-10-CM | POA: Diagnosis not present

## 2022-09-26 DIAGNOSIS — E876 Hypokalemia: Secondary | ICD-10-CM | POA: Diagnosis not present

## 2022-09-28 DIAGNOSIS — E1122 Type 2 diabetes mellitus with diabetic chronic kidney disease: Secondary | ICD-10-CM | POA: Diagnosis not present

## 2022-09-28 DIAGNOSIS — Z992 Dependence on renal dialysis: Secondary | ICD-10-CM | POA: Diagnosis not present

## 2022-09-28 DIAGNOSIS — D689 Coagulation defect, unspecified: Secondary | ICD-10-CM | POA: Diagnosis not present

## 2022-09-28 DIAGNOSIS — D631 Anemia in chronic kidney disease: Secondary | ICD-10-CM | POA: Diagnosis not present

## 2022-09-28 DIAGNOSIS — E876 Hypokalemia: Secondary | ICD-10-CM | POA: Diagnosis not present

## 2022-09-28 DIAGNOSIS — N186 End stage renal disease: Secondary | ICD-10-CM | POA: Diagnosis not present

## 2022-09-28 DIAGNOSIS — N2581 Secondary hyperparathyroidism of renal origin: Secondary | ICD-10-CM | POA: Diagnosis not present

## 2022-09-28 DIAGNOSIS — R52 Pain, unspecified: Secondary | ICD-10-CM | POA: Diagnosis not present

## 2022-10-01 ENCOUNTER — Emergency Department (HOSPITAL_BASED_OUTPATIENT_CLINIC_OR_DEPARTMENT_OTHER): Payer: Medicare HMO

## 2022-10-01 ENCOUNTER — Encounter: Payer: Self-pay | Admitting: Family Medicine

## 2022-10-01 ENCOUNTER — Emergency Department (HOSPITAL_BASED_OUTPATIENT_CLINIC_OR_DEPARTMENT_OTHER)
Admission: EM | Admit: 2022-10-01 | Discharge: 2022-10-01 | Disposition: A | Discharge status: Home / Self Care | Payer: Medicare HMO | Attending: Emergency Medicine | Admitting: Emergency Medicine

## 2022-10-01 ENCOUNTER — Encounter (HOSPITAL_BASED_OUTPATIENT_CLINIC_OR_DEPARTMENT_OTHER): Payer: Self-pay | Admitting: Emergency Medicine

## 2022-10-01 ENCOUNTER — Other Ambulatory Visit: Payer: Self-pay

## 2022-10-01 DIAGNOSIS — D631 Anemia in chronic kidney disease: Secondary | ICD-10-CM | POA: Diagnosis not present

## 2022-10-01 DIAGNOSIS — M79661 Pain in right lower leg: Secondary | ICD-10-CM | POA: Diagnosis not present

## 2022-10-01 DIAGNOSIS — E1122 Type 2 diabetes mellitus with diabetic chronic kidney disease: Secondary | ICD-10-CM | POA: Diagnosis not present

## 2022-10-01 DIAGNOSIS — E119 Type 2 diabetes mellitus without complications: Secondary | ICD-10-CM | POA: Diagnosis not present

## 2022-10-01 DIAGNOSIS — Z87891 Personal history of nicotine dependence: Secondary | ICD-10-CM | POA: Diagnosis not present

## 2022-10-01 DIAGNOSIS — R600 Localized edema: Secondary | ICD-10-CM | POA: Diagnosis not present

## 2022-10-01 DIAGNOSIS — M79662 Pain in left lower leg: Secondary | ICD-10-CM | POA: Diagnosis not present

## 2022-10-01 DIAGNOSIS — M79604 Pain in right leg: Secondary | ICD-10-CM

## 2022-10-01 DIAGNOSIS — I132 Hypertensive heart and chronic kidney disease with heart failure and with stage 5 chronic kidney disease, or end stage renal disease: Secondary | ICD-10-CM | POA: Insufficient documentation

## 2022-10-01 DIAGNOSIS — E876 Hypokalemia: Secondary | ICD-10-CM | POA: Diagnosis not present

## 2022-10-01 DIAGNOSIS — Z95810 Presence of automatic (implantable) cardiac defibrillator: Secondary | ICD-10-CM | POA: Insufficient documentation

## 2022-10-01 DIAGNOSIS — R52 Pain, unspecified: Secondary | ICD-10-CM | POA: Diagnosis not present

## 2022-10-01 DIAGNOSIS — M25561 Pain in right knee: Secondary | ICD-10-CM | POA: Insufficient documentation

## 2022-10-01 DIAGNOSIS — D689 Coagulation defect, unspecified: Secondary | ICD-10-CM | POA: Diagnosis not present

## 2022-10-01 DIAGNOSIS — Z992 Dependence on renal dialysis: Secondary | ICD-10-CM | POA: Diagnosis not present

## 2022-10-01 DIAGNOSIS — I509 Heart failure, unspecified: Secondary | ICD-10-CM | POA: Insufficient documentation

## 2022-10-01 DIAGNOSIS — F0390 Unspecified dementia without behavioral disturbance: Secondary | ICD-10-CM | POA: Insufficient documentation

## 2022-10-01 DIAGNOSIS — N186 End stage renal disease: Secondary | ICD-10-CM | POA: Diagnosis not present

## 2022-10-01 DIAGNOSIS — R69 Illness, unspecified: Secondary | ICD-10-CM | POA: Diagnosis not present

## 2022-10-01 DIAGNOSIS — M79605 Pain in left leg: Secondary | ICD-10-CM | POA: Diagnosis not present

## 2022-10-01 DIAGNOSIS — N2581 Secondary hyperparathyroidism of renal origin: Secondary | ICD-10-CM | POA: Diagnosis not present

## 2022-10-01 LAB — BASIC METABOLIC PANEL
Anion gap: 16 — ABNORMAL HIGH (ref 5–15)
BUN: 44 mg/dL — ABNORMAL HIGH (ref 8–23)
CO2: 27 mmol/L (ref 22–32)
Calcium: 9.3 mg/dL (ref 8.9–10.3)
Chloride: 94 mmol/L — ABNORMAL LOW (ref 98–111)
Creatinine, Ser: 7.08 mg/dL — ABNORMAL HIGH (ref 0.61–1.24)
GFR, Estimated: 7 mL/min — ABNORMAL LOW (ref >60)
Glucose, Bld: 132 mg/dL — ABNORMAL HIGH (ref 70–99)
Potassium: 4.1 mmol/L (ref 3.5–5.1)
Sodium: 137 mmol/L (ref 135–145)

## 2022-10-01 LAB — CBC WITH DIFFERENTIAL/PLATELET
Abs Immature Granulocytes: 0.04 10*3/uL (ref 0.00–0.07)
Basophils Absolute: 0 10*3/uL (ref 0.0–0.1)
Basophils Relative: 1 %
Eosinophils Absolute: 0.2 10*3/uL (ref 0.0–0.5)
Eosinophils Relative: 4 %
HCT: 28.7 % — ABNORMAL LOW (ref 39.0–52.0)
Hemoglobin: 9.8 g/dL — ABNORMAL LOW (ref 13.0–17.0)
Immature Granulocytes: 1 %
Lymphocytes Relative: 14 %
Lymphs Abs: 0.7 10*3/uL (ref 0.7–4.0)
MCH: 30 pg (ref 26.0–34.0)
MCHC: 34.1 g/dL (ref 30.0–36.0)
MCV: 87.8 fL (ref 80.0–100.0)
Monocytes Absolute: 0.5 10*3/uL (ref 0.1–1.0)
Monocytes Relative: 9 %
Neutro Abs: 3.7 10*3/uL (ref 1.7–7.7)
Neutrophils Relative %: 71 %
Platelets: 93 10*3/uL — ABNORMAL LOW (ref 150–400)
RBC: 3.27 MIL/uL — ABNORMAL LOW (ref 4.22–5.81)
RDW: 17.7 % — ABNORMAL HIGH (ref 11.5–15.5)
WBC: 5.1 10*3/uL (ref 4.0–10.5)
nRBC: 0.4 % — ABNORMAL HIGH (ref 0.0–0.2)

## 2022-10-01 NOTE — ED Triage Notes (Signed)
Bilateral leg swelling and pain.notice by daughter tonight. Pain woke from sleep. Dialysis pt T, Th, Sat

## 2022-10-01 NOTE — ED Notes (Signed)
Discharge instructions provided by edp were discussed with pt and caregivers at bedside. Pt/caregivers verbalized understanding with no additional questions at this time. Pt wheelchair to car by this RN.

## 2022-10-01 NOTE — ED Provider Notes (Signed)
[facility] Provider Note: Lowella Dell, MD, FACEP  CSN: [id number] MRN: [id number] ARRIVAL: 10/01/22 at 0008 ROOM: DB015/DB015   CHIEF COMPLAINT  Leg Swelling   HISTORY OF PRESENT ILLNESS  10/01/22 2:03 AM Tyler Hahn is a 82 y.o. male with a history of end-stage renal disease on hemodialysis (Tuesday, Thursday, Saturday), gout, lower extremity edema and dementia.  His family brought him in this morning because he was having pain in his lower legs worse than usual.  He particularly had pain in his right knee.  On arrival he rated his pain as a 6 out of 10.  It has subsequently improved.  Both of his legs are edematous.  He is not having any shortness of breath.   Past Medical History:  Diagnosis Date   Anxiety    Automatic implantable cardioverter-defibrillator in situ    greg taylor   CHF (congestive heart failure)    2000   Diabetes mellitus    no meds   Fatty liver    Gout    "bout 2-3 months ago"-meds helped.   Hyperlipidemia    Hypertension    Hyperthyroidism    Kidney cysts    PONV (postoperative nausea and vomiting)    Renal insufficiency     Past Surgical History:  Procedure Laterality Date   AV FISTULA PLACEMENT Right 08/23/2019   Procedure: ARTERIOVENOUS GORTEX GRAFT RIGHT ARM;  Surgeon: Larina Earthly, MD;  Location: [facility] OR;  Service: Vascular;  Laterality: Right;   CARDIAC DEFIBRILLATOR PLACEMENT     catract     COLONOSCOPY WITH PROPOFOL N/A 10/03/2015   Procedure: COLONOSCOPY WITH PROPOFOL;  Surgeon: Meryl Dare, MD;  Location: [facility] ENDOSCOPY;  Service: Endoscopy;  Laterality: N/A;   DIAGNOSTIC LAPAROSCOPY  01/27/2014   Dr Luisa Hart   ENTEROSCOPY N/A 11/25/2013   Procedure: ENTEROSCOPY;  Surgeon: Meryl Dare, MD;  Location: [facility] ENDOSCOPY;  Service: Endoscopy;  Laterality: N/A;   ICD GENERATOR CHANGEOUT N/A 11/06/2020   Procedure: ICD GENERATOR CHANGEOUT;  Surgeon: Duke Salvia, MD;  Location: [facility] INVASIVE CV LAB;  Service:  Cardiovascular;  Laterality: N/A;   ICD,[place] [employer]     LAPAROSCOPY N/A 01/27/2014   Procedure: LAPAROSCOPY DIAGNOSTIC;  Surgeon: Clovis Pu. Cornett, MD;  Location: [facility] OR;  Service: General;  Laterality: N/A;   polyp removal throat     difficulty speaking    Family History  Problem Relation Age of Onset   Stomach cancer Mother    Alzheimer's disease Mother    Diabetes Father    Heart disease Father    Liver disease Father    Kidney disease Father     Social History   Tobacco Use   Smoking status: Former    Types: Cigarettes    Quit date: 11/26/1998    Years since quitting: 23.8   Smokeless tobacco: Never  Vaping Use   Vaping Use: Never used  Substance Use Topics   Alcohol use: No    Alcohol/week: 0.0 standard drinks of alcohol   Drug use: No    Prior to Admission medications   Medication Sig Start Date End Date Taking? Authorizing Provider  allopurinol (ZYLOPRIM) 100 MG tablet TAKE 1 TABLET BY MOUTH 3 TIMES A WEEK ON MONDAY, [facility] AND FRIDAY 08/16/22   Sheliah Hatch, MD  atorvastatin (LIPITOR) 20 MG tablet Take 1 tablet (20 mg total) by mouth daily. 04/24/22   Sheliah Hatch, MD  donepezil (ARICEPT) 10 MG tablet TAKE 1 TABLET BY  MOUTH EVERYDAY AT BEDTIME 06/03/22   Sheliah Hatch, MD  doxycycline (VIBRAMYCIN) 100 MG capsule Take 1 capsule (100 mg total) by mouth 2 (two) times daily. 05/31/22   Rancour, Jeannett Senior, MD  furosemide (LASIX) 20 MG tablet TAKE 1 TABLET BY MOUTH EVERY DAY 08/05/22   Sheliah Hatch, MD  ipratropium (ATROVENT) 0.06 % nasal spray Place 1-2 sprays into both nostrils 4 (four) times daily. As needed for nasal congestion 04/08/22   Shade Flood, MD  levothyroxine (SYNTHROID) 50 MCG tablet Take 1 tablet (50 mcg total) by mouth daily. 04/23/22   Sheliah Hatch, MD  loratadine (CLARITIN) 10 MG tablet Take 1 tablet (10 mg total) by mouth daily. 02/25/22   Sheliah Hatch, MD  Multiple Vitamins-Minerals (MULTIVITAMIN WITH  MINERALS) tablet Take 1 tablet by mouth daily.    [provider]  omeprazole (PRILOSEC) 40 MG capsule TAKE 1 CAPSULE BY MOUTH EVERY DAY 08/05/22   Sheliah Hatch, MD  ondansetron (ZOFRAN-ODT) 4 MG disintegrating tablet  ODT q4 hours prn nausea/vomit 04/16/22   Pollina, Canary Brim, MD  sertraline (ZOLOFT) 100 MG tablet TAKE 1 TABLET BY MOUTH EVERY DAY 07/01/22   Sheliah Hatch, MD    Allergies Sulfa antibiotics and Sulfonamide derivatives   REVIEW OF SYSTEMS  Level 5 caveat: Dementia   PHYSICAL EXAMINATION  Initial Vital Signs Blood pressure 103/64, pulse 88, temperature 97.8 F (36.6 C), resp. rate 20, SpO2 94 %.  Examination General: Well-developed, well-nourished male in no acute distress; appearance consistent with age of record HENT: normocephalic; atraumatic Eyes: Normal appearance Neck: supple Heart: regular rate and rhythm Lungs: clear to auscultation bilaterally Abdomen: soft; nondistended; nontender; bowel sounds present Extremities: No deformity; edema of lower extremities; no significant pain on passive range of motion Neurologic: Awake, alert; motor function intact in all extremities and symmetric; no facial droop Skin: Warm and dry Psychiatric: Normal mood and affect   RESULTS  Summary of this visit's results, reviewed and interpreted by myself:   EKG Interpretation  Date/Time:    Ventricular Rate:    PR Interval:    QRS Duration:   QT Interval:    QTC Calculation:   R Axis:     Text Interpretation:         Laboratory Studies: Results for orders placed or performed during the hospital encounter of 10/01/22 (from the past 24 hour(s))  CBC with Differential     Status: Abnormal   Collection Time: 10/01/22 12:34 AM  Result Value Ref Range   WBC 5.1 4.0 - 10.5 K/uL   RBC 3.27 (L) 4.22 - 5.81 MIL/uL   Hemoglobin 9.8 (L) 13.0 - 17.0 g/dL   HCT 40.9 (L) 81.1 - 91.4 %   MCV 87.8 80.0 - 100.0 fL   MCH 30.0 26.0 - 34.0 pg   MCHC  34.1 30.0 - 36.0 g/dL   RDW 78.2 (H) 95.6 - 21.3 %   Platelets 93 (L) 150 - 400 K/uL   nRBC 0.4 (H) 0.0 - 0.2 %   Neutrophils Relative % 71 %   Neutro Abs 3.7 1.7 - 7.7 K/uL   Lymphocytes Relative 14 %   Lymphs Abs 0.7 0.7 - 4.0 K/uL   Monocytes Relative 9 %   Monocytes Absolute 0.5 0.1 - 1.0 K/uL   Eosinophils Relative 4 %   Eosinophils Absolute 0.2 0.0 - 0.5 K/uL   Basophils Relative 1 %   Basophils Absolute 0.0 0.0 - 0.1 K/uL   Immature Granulocytes  1 %   Abs Immature Granulocytes 0.04 0.00 - 0.07 K/uL  Basic metabolic panel     Status: Abnormal   Collection Time: 10/01/22 12:34 AM  Result Value Ref Range   Sodium 137 135 - 145 mmol/L   Potassium 4.1 3.5 - 5.1 mmol/L   Chloride 94 (L) 98 - 111 mmol/L   CO2 27 22 - 32 mmol/L   Glucose, Bld 132 (H) 70 - 99 mg/dL   BUN 44 (H) 8 - 23 mg/dL   Creatinine, Ser 1.61 (H) 0.61 - 1.24 mg/dL   Calcium 9.3 8.9 - 09.6 mg/dL   GFR, Estimated 7 (L) >60 mL/min   Anion gap 16 (H) 5 - 15   Imaging Studies: US Venous Img Lower Bilateral (DVT)  Result Date: 10/01/2022 CLINICAL DATA:  Leg pain EXAM: BILATERAL LOWER EXTREMITY VENOUS DOPPLER ULTRASOUND TECHNIQUE: Gray-scale sonography with compression, as well as color and duplex ultrasound, were performed to evaluate the deep venous system(s) from the level of the common femoral vein through the popliteal and proximal calf veins. COMPARISON:  None Available. FINDINGS: VENOUS Normal compressibility of the common femoral, superficial femoral, and popliteal veins, as well as the visualized calf veins. Visualized portions of profunda femoral vein and great saphenous vein unremarkable. No filling defects to suggest DVT on grayscale or color Doppler imaging. Doppler waveforms show normal direction of venous flow, normal respiratory plasticity and response to augmentation. OTHER 3.7 x 0.6 x 1.4 cm right popliteal fossa cyst. 2.2 x 0.5 x 0.9 cm left popliteal fossa cyst. Subcutaneous edema in the bilateral  calves. Limitations: none IMPRESSION: Negative. Electronically Signed   By: Charline Bills M.D.   On: 10/01/2022 02:22    ED COURSE and MDM  Nursing notes, initial and subsequent vitals signs, including pulse oximetry, reviewed and interpreted by myself.  Vitals:   10/01/22 0023  BP: 103/64  Pulse: 88  Resp: 20  Temp: 97.8 F (36.6 C)  SpO2: 94%   Medications - No data to display  The cause of the patient's pain is unclear.  If it was gout I would expect significant pain on passive movement of his lower extremity joints, especially the right knee since that was what was hurting earlier.  He is not complaining of pain at the present time.  His leg edema is chronic and usually improves when he raises his legs.  I do not believe any specific treatment is indicated at the current time but his family was advised to return should symptoms worsen.  PROCEDURES  Procedures   ED DIAGNOSES     ICD-10-CM   1. Pain in both lower extremities  M79.604    M79.605          Briteny Fulghum, Jonny Ruiz, MD 10/01/22 [phone]

## 2022-10-02 NOTE — Progress Notes (Signed)
Remote ICD transmission.   

## 2022-10-02 NOTE — Telephone Encounter (Signed)
Spoke to pt son and he made an apt for Friday 10/04/22 @ 140 pm for his father

## 2022-10-03 DIAGNOSIS — D689 Coagulation defect, unspecified: Secondary | ICD-10-CM | POA: Diagnosis not present

## 2022-10-03 DIAGNOSIS — Z992 Dependence on renal dialysis: Secondary | ICD-10-CM | POA: Diagnosis not present

## 2022-10-03 DIAGNOSIS — E876 Hypokalemia: Secondary | ICD-10-CM | POA: Diagnosis not present

## 2022-10-03 DIAGNOSIS — N186 End stage renal disease: Secondary | ICD-10-CM | POA: Diagnosis not present

## 2022-10-03 DIAGNOSIS — D631 Anemia in chronic kidney disease: Secondary | ICD-10-CM | POA: Diagnosis not present

## 2022-10-03 DIAGNOSIS — R52 Pain, unspecified: Secondary | ICD-10-CM | POA: Diagnosis not present

## 2022-10-03 DIAGNOSIS — N2581 Secondary hyperparathyroidism of renal origin: Secondary | ICD-10-CM | POA: Diagnosis not present

## 2022-10-03 DIAGNOSIS — E1122 Type 2 diabetes mellitus with diabetic chronic kidney disease: Secondary | ICD-10-CM | POA: Diagnosis not present

## 2022-10-04 ENCOUNTER — Ambulatory Visit (INDEPENDENT_AMBULATORY_CARE_PROVIDER_SITE_OTHER): Payer: Medicare HMO | Admitting: Family Medicine

## 2022-10-04 ENCOUNTER — Encounter: Payer: Self-pay | Admitting: Family Medicine

## 2022-10-04 VITALS — BP 124/70 | HR 57 | Temp 98.1°F | Ht 67.0 in | Wt 182.8 lb

## 2022-10-04 DIAGNOSIS — G479 Sleep disorder, unspecified: Secondary | ICD-10-CM | POA: Insufficient documentation

## 2022-10-04 NOTE — Progress Notes (Signed)
   Subjective:    Patient ID: Tyler Hahn, male    DOB: December 24, 1940, 82 y.o.   MRN: [id number]  HPI Insomnia- son reports pt will get 2 hrs of rest and then 'pop back up'.  Seems to have days and nights mixed up.  Son reports he will get up and think it's morning and 'time to go'.  Will occasionally nap during the day but not always.  Sxs started ~2 months ago.  Doesn't have issues w/ agitation.  Son just recently started Melatonin and caregiver said he slept well last night.  They feel like his knees bother him at time and this wakes him from sleep.   Review of Systems For ROS see HPI     Objective:   Physical Exam Vitals reviewed.  Constitutional:      General: He is not in acute distress.    Appearance: Normal appearance. He is not ill-appearing.  Cardiovascular:     Rate and Rhythm: Normal rate and regular rhythm.  Pulmonary:     Effort: Pulmonary effort is normal. No respiratory distress.     Breath sounds: Normal breath sounds. No wheezing or rhonchi.  Abdominal:     General: There is no distension.     Palpations: Abdomen is soft.     Tenderness: There is no abdominal tenderness. There is no guarding or rebound.  Musculoskeletal:     Right lower leg: Edema present.     Left lower leg: Edema present.  Skin:    General: Skin is warm and dry.  Neurological:     Mental Status: He is alert. Mental status is at baseline.  Psychiatric:        Mood and Affect: Mood normal.        Behavior: Behavior normal.        Thought Content: Thought content normal.           Assessment & Plan:  Sleep disturbance- new.  Son and caregiver report that he will only sleep 2-3 hrs at a time and then wake up thinking it's morning.  They both deny agitation.  Given lack of agitation and high risk for falls, I don't want to give anything that would sedate him.  Since they just started melatonin I encouraged them to give that more time.  Also discussed a sunrise alarm clock so he doesn't  have to worry about what time it is, but knows he needs to stay in bed if the sun is not up.  Will try Voltaren gel for knees.  They are to reach out w/ any questions or concerns.  Total time spent w/ pt, son, and caregiver- 32 minutes, >50% spent counseling

## 2022-10-04 NOTE — Patient Instructions (Signed)
Follow up as needed or as scheduled CONTINUE the Melatonin ~1 hr before the desired bedtime Try the sun alarm clock and tell him he can be up when the sun is up but in bed when the sun is down Try OTC Voltaren gel before bed to help w/ any knee or joint pain Ask dialysis to monitor him more regularly and redirect/instruct when needed Call with any questions or concerns Hang in there! Have a great weekend!!

## 2022-10-05 ENCOUNTER — Other Ambulatory Visit: Payer: Self-pay | Admitting: Family Medicine

## 2022-10-05 DIAGNOSIS — D631 Anemia in chronic kidney disease: Secondary | ICD-10-CM | POA: Diagnosis not present

## 2022-10-05 DIAGNOSIS — Z992 Dependence on renal dialysis: Secondary | ICD-10-CM | POA: Diagnosis not present

## 2022-10-05 DIAGNOSIS — D689 Coagulation defect, unspecified: Secondary | ICD-10-CM | POA: Diagnosis not present

## 2022-10-05 DIAGNOSIS — E876 Hypokalemia: Secondary | ICD-10-CM | POA: Diagnosis not present

## 2022-10-05 DIAGNOSIS — R52 Pain, unspecified: Secondary | ICD-10-CM | POA: Diagnosis not present

## 2022-10-05 DIAGNOSIS — N2581 Secondary hyperparathyroidism of renal origin: Secondary | ICD-10-CM | POA: Diagnosis not present

## 2022-10-05 DIAGNOSIS — E1122 Type 2 diabetes mellitus with diabetic chronic kidney disease: Secondary | ICD-10-CM | POA: Diagnosis not present

## 2022-10-05 DIAGNOSIS — N186 End stage renal disease: Secondary | ICD-10-CM | POA: Diagnosis not present

## 2022-10-07 ENCOUNTER — Telehealth: Payer: Self-pay

## 2022-10-07 NOTE — Telephone Encounter (Signed)
        Patient  visited [facility] on 422     Telephone encounter attempt :  2nd  A HIPAA compliant voice message was left requesting a return call.  Instructed patient to call back     Lenard Forth [facility] Guide, [facility] [phone] [address] [place], [place], [place] [address] Phone: [phone] Email: [email or web address]

## 2022-10-07 NOTE — Telephone Encounter (Signed)
        Patient  visited [facility] on 4/16   Telephone encounter attempt :  1st  A HIPAA compliant voice message was left requesting a return call.  Instructed patient to call back     Tyler Hahn [facility] Guide, [place] [phone] [address], [place], [place] [address] Phone: [phone] Email: [email or web address]       

## 2022-10-08 DIAGNOSIS — E1122 Type 2 diabetes mellitus with diabetic chronic kidney disease: Secondary | ICD-10-CM | POA: Diagnosis not present

## 2022-10-08 DIAGNOSIS — E876 Hypokalemia: Secondary | ICD-10-CM | POA: Diagnosis not present

## 2022-10-08 DIAGNOSIS — R52 Pain, unspecified: Secondary | ICD-10-CM | POA: Diagnosis not present

## 2022-10-08 DIAGNOSIS — D631 Anemia in chronic kidney disease: Secondary | ICD-10-CM | POA: Diagnosis not present

## 2022-10-08 DIAGNOSIS — N186 End stage renal disease: Secondary | ICD-10-CM | POA: Diagnosis not present

## 2022-10-08 DIAGNOSIS — Z992 Dependence on renal dialysis: Secondary | ICD-10-CM | POA: Diagnosis not present

## 2022-10-08 DIAGNOSIS — D689 Coagulation defect, unspecified: Secondary | ICD-10-CM | POA: Diagnosis not present

## 2022-10-08 DIAGNOSIS — N2581 Secondary hyperparathyroidism of renal origin: Secondary | ICD-10-CM | POA: Diagnosis not present

## 2022-10-09 ENCOUNTER — Telehealth: Payer: Self-pay | Admitting: Family Medicine

## 2022-10-09 NOTE — Telephone Encounter (Signed)
Contacted Tyler Hahn to schedule their annual wellness visit. Appointment made for 10/16/2022.  Thank you,  [facility] Support [facility] Direct dial  [phone]

## 2022-10-10 DIAGNOSIS — N2581 Secondary hyperparathyroidism of renal origin: Secondary | ICD-10-CM | POA: Diagnosis not present

## 2022-10-10 DIAGNOSIS — E876 Hypokalemia: Secondary | ICD-10-CM | POA: Diagnosis not present

## 2022-10-10 DIAGNOSIS — D689 Coagulation defect, unspecified: Secondary | ICD-10-CM | POA: Diagnosis not present

## 2022-10-10 DIAGNOSIS — N186 End stage renal disease: Secondary | ICD-10-CM | POA: Diagnosis not present

## 2022-10-10 DIAGNOSIS — Z992 Dependence on renal dialysis: Secondary | ICD-10-CM | POA: Diagnosis not present

## 2022-10-10 DIAGNOSIS — D631 Anemia in chronic kidney disease: Secondary | ICD-10-CM | POA: Diagnosis not present

## 2022-10-10 DIAGNOSIS — R52 Pain, unspecified: Secondary | ICD-10-CM | POA: Diagnosis not present

## 2022-10-10 DIAGNOSIS — E1122 Type 2 diabetes mellitus with diabetic chronic kidney disease: Secondary | ICD-10-CM | POA: Diagnosis not present

## 2022-10-12 DIAGNOSIS — N186 End stage renal disease: Secondary | ICD-10-CM | POA: Diagnosis not present

## 2022-10-12 DIAGNOSIS — Z992 Dependence on renal dialysis: Secondary | ICD-10-CM | POA: Diagnosis not present

## 2022-10-12 DIAGNOSIS — D689 Coagulation defect, unspecified: Secondary | ICD-10-CM | POA: Diagnosis not present

## 2022-10-12 DIAGNOSIS — N2581 Secondary hyperparathyroidism of renal origin: Secondary | ICD-10-CM | POA: Diagnosis not present

## 2022-10-12 DIAGNOSIS — D631 Anemia in chronic kidney disease: Secondary | ICD-10-CM | POA: Diagnosis not present

## 2022-10-12 DIAGNOSIS — R52 Pain, unspecified: Secondary | ICD-10-CM | POA: Diagnosis not present

## 2022-10-12 DIAGNOSIS — E1122 Type 2 diabetes mellitus with diabetic chronic kidney disease: Secondary | ICD-10-CM | POA: Diagnosis not present

## 2022-10-12 DIAGNOSIS — E876 Hypokalemia: Secondary | ICD-10-CM | POA: Diagnosis not present

## 2022-10-15 DIAGNOSIS — R52 Pain, unspecified: Secondary | ICD-10-CM | POA: Diagnosis not present

## 2022-10-15 DIAGNOSIS — E876 Hypokalemia: Secondary | ICD-10-CM | POA: Diagnosis not present

## 2022-10-15 DIAGNOSIS — N2581 Secondary hyperparathyroidism of renal origin: Secondary | ICD-10-CM | POA: Diagnosis not present

## 2022-10-15 DIAGNOSIS — I129 Hypertensive chronic kidney disease with stage 1 through stage 4 chronic kidney disease, or unspecified chronic kidney disease: Secondary | ICD-10-CM | POA: Diagnosis not present

## 2022-10-15 DIAGNOSIS — E1122 Type 2 diabetes mellitus with diabetic chronic kidney disease: Secondary | ICD-10-CM | POA: Diagnosis not present

## 2022-10-15 DIAGNOSIS — N186 End stage renal disease: Secondary | ICD-10-CM | POA: Diagnosis not present

## 2022-10-15 DIAGNOSIS — D631 Anemia in chronic kidney disease: Secondary | ICD-10-CM | POA: Diagnosis not present

## 2022-10-15 DIAGNOSIS — D689 Coagulation defect, unspecified: Secondary | ICD-10-CM | POA: Diagnosis not present

## 2022-10-15 DIAGNOSIS — Z992 Dependence on renal dialysis: Secondary | ICD-10-CM | POA: Diagnosis not present

## 2022-10-17 DIAGNOSIS — N186 End stage renal disease: Secondary | ICD-10-CM | POA: Diagnosis not present

## 2022-10-17 DIAGNOSIS — D631 Anemia in chronic kidney disease: Secondary | ICD-10-CM | POA: Diagnosis not present

## 2022-10-17 DIAGNOSIS — E876 Hypokalemia: Secondary | ICD-10-CM | POA: Diagnosis not present

## 2022-10-17 DIAGNOSIS — Z992 Dependence on renal dialysis: Secondary | ICD-10-CM | POA: Diagnosis not present

## 2022-10-17 DIAGNOSIS — D689 Coagulation defect, unspecified: Secondary | ICD-10-CM | POA: Diagnosis not present

## 2022-10-17 DIAGNOSIS — N2581 Secondary hyperparathyroidism of renal origin: Secondary | ICD-10-CM | POA: Diagnosis not present

## 2022-10-19 DIAGNOSIS — E876 Hypokalemia: Secondary | ICD-10-CM | POA: Diagnosis not present

## 2022-10-19 DIAGNOSIS — D689 Coagulation defect, unspecified: Secondary | ICD-10-CM | POA: Diagnosis not present

## 2022-10-19 DIAGNOSIS — N186 End stage renal disease: Secondary | ICD-10-CM | POA: Diagnosis not present

## 2022-10-19 DIAGNOSIS — D631 Anemia in chronic kidney disease: Secondary | ICD-10-CM | POA: Diagnosis not present

## 2022-10-19 DIAGNOSIS — N2581 Secondary hyperparathyroidism of renal origin: Secondary | ICD-10-CM | POA: Diagnosis not present

## 2022-10-19 DIAGNOSIS — Z992 Dependence on renal dialysis: Secondary | ICD-10-CM | POA: Diagnosis not present

## 2022-10-22 DIAGNOSIS — E876 Hypokalemia: Secondary | ICD-10-CM | POA: Diagnosis not present

## 2022-10-22 DIAGNOSIS — N186 End stage renal disease: Secondary | ICD-10-CM | POA: Diagnosis not present

## 2022-10-22 DIAGNOSIS — D631 Anemia in chronic kidney disease: Secondary | ICD-10-CM | POA: Diagnosis not present

## 2022-10-22 DIAGNOSIS — Z992 Dependence on renal dialysis: Secondary | ICD-10-CM | POA: Diagnosis not present

## 2022-10-22 DIAGNOSIS — D689 Coagulation defect, unspecified: Secondary | ICD-10-CM | POA: Diagnosis not present

## 2022-10-22 DIAGNOSIS — N2581 Secondary hyperparathyroidism of renal origin: Secondary | ICD-10-CM | POA: Diagnosis not present

## 2022-10-24 DIAGNOSIS — Z992 Dependence on renal dialysis: Secondary | ICD-10-CM | POA: Diagnosis not present

## 2022-10-24 DIAGNOSIS — D631 Anemia in chronic kidney disease: Secondary | ICD-10-CM | POA: Diagnosis not present

## 2022-10-24 DIAGNOSIS — N2581 Secondary hyperparathyroidism of renal origin: Secondary | ICD-10-CM | POA: Diagnosis not present

## 2022-10-24 DIAGNOSIS — N186 End stage renal disease: Secondary | ICD-10-CM | POA: Diagnosis not present

## 2022-10-24 DIAGNOSIS — D689 Coagulation defect, unspecified: Secondary | ICD-10-CM | POA: Diagnosis not present

## 2022-10-24 DIAGNOSIS — E876 Hypokalemia: Secondary | ICD-10-CM | POA: Diagnosis not present

## 2022-10-25 ENCOUNTER — Ambulatory Visit: Payer: Medicare HMO | Attending: Internal Medicine | Admitting: Internal Medicine

## 2022-10-25 ENCOUNTER — Encounter: Payer: Self-pay | Admitting: Internal Medicine

## 2022-10-25 VITALS — BP 102/64 | HR 91 | Ht 67.0 in | Wt 180.8 lb

## 2022-10-25 DIAGNOSIS — I5022 Chronic systolic (congestive) heart failure: Secondary | ICD-10-CM | POA: Diagnosis not present

## 2022-10-25 DIAGNOSIS — I472 Ventricular tachycardia, unspecified: Secondary | ICD-10-CM | POA: Diagnosis not present

## 2022-10-25 DIAGNOSIS — Z95810 Presence of automatic (implantable) cardiac defibrillator: Secondary | ICD-10-CM | POA: Diagnosis not present

## 2022-10-25 NOTE — Patient Instructions (Signed)
Medication Instructions:  Your physician has recommended you make the following change in your medication:   ** Stop Furosemide  *If you need a refill on your cardiac medications before your next appointment, please call your pharmacy*   Lab Work: None ordered.  If you have labs (blood work) drawn today and your tests are completely normal, you will receive your results only by: MyChart Message (if you have MyChart) OR A paper copy in the mail If you have any lab test that is abnormal or we need to change your treatment, we will call you to review the results.   Testing/Procedures: None ordered.    Follow-Up: At [facility], you and your health needs are our priority.  As part of our continuing mission to provide you with exceptional heart care, we have created designated Provider Care Teams.  These Care Teams include your primary Cardiologist (physician) and Advanced Practice Providers (APPs -  Physician Assistants and Nurse Practitioners) who all work together to provide you with the care you need, when you need it.  We recommend signing up for the patient portal called "MyChart".  Sign up information is provided on this After Visit Summary.  MyChart is used to connect with patients for Virtual Visits (Telemedicine).  Patients are able to view lab/test results, encounter notes, upcoming appointments, etc.  Non-urgent messages can be sent to your provider as well.   To learn more about what you can do with MyChart, go to [email or web address].    Your next appointment:   12 months with Dr Graciela Husbands

## 2022-10-25 NOTE — Progress Notes (Signed)
Patient Care Team: Sheliah Hatch, MD as PCP - Isaiah Blakes, MD as PCP - Electrophysiology (Cardiology) Duke Salvia, MD as Consulting Physician (Cardiology) Romero Belling, MD (Inactive) as Consulting Physician (Endocrinology) Meryl Dare, MD as Consulting Physician (Gastroenterology) Maxie Barb, MD as Consulting Physician (Nephrology) Erroll Luna, [facility] (Pharmacist)   HPI  Tyler Hahn is a 82 y.o. male Seen in follow-up for a [employer] ICD implanted 2009 for primary prevention in the setting of nonischemic cardiomyopathy with subsequent appropriate therapy for VT VF, albeit in the context of hypokalemia and ongoing generator change 6/22  End-stage renal disease on renal replacement therapy  Guideline directed medical therapy limited by ESRD and hypotension previusly intolerant of even low dose carvedilol  Dialysis is complicated by his dementia prompting to pull out needles, hence with short dialysis times, and with fuller dialysis times hypotension   DATE TEST EF   4/14 Echo   20-25 %         Date Cr K Hgb  9//22 6.57 4.6 10.7   4/24  4.1 9.8     Records and Results Reviewed   Past Medical History:  Diagnosis Date   Anxiety    Automatic implantable cardioverter-defibrillator in situ    greg taylor   CHF (congestive heart failure) (HCC)    2000   Diabetes mellitus    no meds   Fatty liver    Gout    "bout 2-3 months ago"-meds helped.   Hyperlipidemia    Hypertension    Hyperthyroidism    Kidney cysts    PONV (postoperative nausea and vomiting)    Renal insufficiency     Past Surgical History:  Procedure Laterality Date   AV FISTULA PLACEMENT Right 08/23/2019   Procedure: ARTERIOVENOUS GORTEX GRAFT RIGHT ARM;  Surgeon: Larina Earthly, MD;  Location: [facility] OR;  Service: Vascular;  Laterality: Right;   CARDIAC DEFIBRILLATOR PLACEMENT     catract     COLONOSCOPY WITH PROPOFOL N/A 10/03/2015    Procedure: COLONOSCOPY WITH PROPOFOL;  Surgeon: Meryl Dare, MD;  Location: [facility] ENDOSCOPY;  Service: Endoscopy;  Laterality: N/A;   DIAGNOSTIC LAPAROSCOPY  01/27/2014   Dr Luisa Hart   ENTEROSCOPY N/A 11/25/2013   Procedure: ENTEROSCOPY;  Surgeon: Meryl Dare, MD;  Location: [facility] ENDOSCOPY;  Service: Endoscopy;  Laterality: N/A;   ICD GENERATOR CHANGEOUT N/A 11/06/2020   Procedure: ICD GENERATOR CHANGEOUT;  Surgeon: Duke Salvia, MD;  Location: [facility] INVASIVE CV LAB;  Service: Cardiovascular;  Laterality: N/A;   ICD,[employer]     LAPAROSCOPY N/A 01/27/2014   Procedure: LAPAROSCOPY DIAGNOSTIC;  Surgeon: Clovis Pu. Cornett, MD;  Location: [facility] OR;  Service: General;  Laterality: N/A;   polyp removal throat     difficulty speaking    Current Meds  Medication Sig   allopurinol (ZYLOPRIM) 100 MG tablet TAKE 1 TABLET BY MOUTH 3 TIMES A WEEK ON MONDAY, WEDNESDAY AND FRIDAY   atorvastatin (LIPITOR) 20 MG tablet Take 1 tablet (20 mg total) by mouth daily.   donepezil (ARICEPT) 10 MG tablet TAKE 1 TABLET BY MOUTH EVERYDAY AT BEDTIME   doxycycline (VIBRAMYCIN) 100 MG capsule Take 1 capsule (100 mg total) by mouth 2 (two) times daily.   furosemide (LASIX) 20 MG tablet TAKE 1 TABLET BY MOUTH EVERY DAY   levothyroxine (SYNTHROID) 50 MCG tablet Take 1 tablet (50 mcg total) by mouth daily.   loratadine (CLARITIN) 10 MG tablet  Take 1 tablet (10 mg total) by mouth daily.   Multiple Vitamins-Minerals (MULTIVITAMIN WITH MINERALS) tablet Take 1 tablet by mouth daily.   omeprazole (PRILOSEC) 40 MG capsule TAKE 1 CAPSULE BY MOUTH EVERY DAY   ondansetron (ZOFRAN-ODT) 4 MG disintegrating tablet 4mg  ODT q4 hours prn nausea/vomit   sertraline (ZOLOFT) 100 MG tablet TAKE 1.5 TABLETS (150MG  TOTAL) BY MOUTH DAILY   [DISCONTINUED] ipratropium (ATROVENT) 0.06 % nasal spray Place 1-2 sprays into both nostrils 4 (four) times daily. As needed for nasal congestion    Allergies  Allergen Reactions   Sulfa  Antibiotics Itching and Rash   Sulfonamide Derivatives Itching and Rash      Review of Systems negative except from HPI and PMH  Physical Exam BP 102/64   Pulse 91   Ht 5\' 7"  (1.702 m)   Wt 180 lb 12.8 oz (82 kg)   BMI 28.32 kg/m  Well developed and nourished in no acute distress HENT normal Neck supple Clear Device pocket well healed; without hematoma or erythema.  There is no tethering  Regular rate and rhythm, no murmurs or gallops Abd-soft with active BS No Clubbing cyanosis 2+ edema Skin-warm and dry A & Oriented  Grossly normal sensory and motor function  ECG sinus at 91 Room 26/10/39 Low voltage  CrCl cannot be calculated (Patient's most recent lab result is older than the maximum 21 days allowed.).   Assessment and  Plan  Nonischemic cardiomyopathy    Congestive heart failure-chronic-systolic      Hypotension   vt-NS no intercurrent VT    Implantable defibrillator-[employer] dual-chamber   Renal failure  RRT  Guideline directed therapy is limited by hypotension.  He is anuric, we will discontinue his furosemide.  I called and spoke to his nephrologist regarding volume overload.  Apparently has been a very difficult effort over the last year with dialysis for the reasons outlined above.  We did discuss end-of-life issues.  The family would like to proceed with DNR status, and he will review with his siblings inactivation of high-voltage therapies.

## 2022-10-26 DIAGNOSIS — N186 End stage renal disease: Secondary | ICD-10-CM | POA: Diagnosis not present

## 2022-10-26 DIAGNOSIS — N2581 Secondary hyperparathyroidism of renal origin: Secondary | ICD-10-CM | POA: Diagnosis not present

## 2022-10-26 DIAGNOSIS — D689 Coagulation defect, unspecified: Secondary | ICD-10-CM | POA: Diagnosis not present

## 2022-10-26 DIAGNOSIS — E876 Hypokalemia: Secondary | ICD-10-CM | POA: Diagnosis not present

## 2022-10-26 DIAGNOSIS — Z992 Dependence on renal dialysis: Secondary | ICD-10-CM | POA: Diagnosis not present

## 2022-10-26 DIAGNOSIS — D631 Anemia in chronic kidney disease: Secondary | ICD-10-CM | POA: Diagnosis not present

## 2022-10-29 DIAGNOSIS — E876 Hypokalemia: Secondary | ICD-10-CM | POA: Diagnosis not present

## 2022-10-29 DIAGNOSIS — N186 End stage renal disease: Secondary | ICD-10-CM | POA: Diagnosis not present

## 2022-10-29 DIAGNOSIS — D689 Coagulation defect, unspecified: Secondary | ICD-10-CM | POA: Diagnosis not present

## 2022-10-29 DIAGNOSIS — N2581 Secondary hyperparathyroidism of renal origin: Secondary | ICD-10-CM | POA: Diagnosis not present

## 2022-10-29 DIAGNOSIS — D631 Anemia in chronic kidney disease: Secondary | ICD-10-CM | POA: Diagnosis not present

## 2022-10-29 DIAGNOSIS — Z992 Dependence on renal dialysis: Secondary | ICD-10-CM | POA: Diagnosis not present

## 2022-10-31 DIAGNOSIS — Z992 Dependence on renal dialysis: Secondary | ICD-10-CM | POA: Diagnosis not present

## 2022-10-31 DIAGNOSIS — E876 Hypokalemia: Secondary | ICD-10-CM | POA: Diagnosis not present

## 2022-10-31 DIAGNOSIS — N2581 Secondary hyperparathyroidism of renal origin: Secondary | ICD-10-CM | POA: Diagnosis not present

## 2022-10-31 DIAGNOSIS — D689 Coagulation defect, unspecified: Secondary | ICD-10-CM | POA: Diagnosis not present

## 2022-10-31 DIAGNOSIS — N186 End stage renal disease: Secondary | ICD-10-CM | POA: Diagnosis not present

## 2022-10-31 DIAGNOSIS — D631 Anemia in chronic kidney disease: Secondary | ICD-10-CM | POA: Diagnosis not present

## 2022-11-02 DIAGNOSIS — N186 End stage renal disease: Secondary | ICD-10-CM | POA: Diagnosis not present

## 2022-11-02 DIAGNOSIS — D631 Anemia in chronic kidney disease: Secondary | ICD-10-CM | POA: Diagnosis not present

## 2022-11-02 DIAGNOSIS — E876 Hypokalemia: Secondary | ICD-10-CM | POA: Diagnosis not present

## 2022-11-02 DIAGNOSIS — N2581 Secondary hyperparathyroidism of renal origin: Secondary | ICD-10-CM | POA: Diagnosis not present

## 2022-11-02 DIAGNOSIS — D689 Coagulation defect, unspecified: Secondary | ICD-10-CM | POA: Diagnosis not present

## 2022-11-02 DIAGNOSIS — Z992 Dependence on renal dialysis: Secondary | ICD-10-CM | POA: Diagnosis not present

## 2022-11-05 DIAGNOSIS — Z992 Dependence on renal dialysis: Secondary | ICD-10-CM | POA: Diagnosis not present

## 2022-11-05 DIAGNOSIS — N2581 Secondary hyperparathyroidism of renal origin: Secondary | ICD-10-CM | POA: Diagnosis not present

## 2022-11-05 DIAGNOSIS — D631 Anemia in chronic kidney disease: Secondary | ICD-10-CM | POA: Diagnosis not present

## 2022-11-05 DIAGNOSIS — N186 End stage renal disease: Secondary | ICD-10-CM | POA: Diagnosis not present

## 2022-11-05 DIAGNOSIS — E876 Hypokalemia: Secondary | ICD-10-CM | POA: Diagnosis not present

## 2022-11-05 DIAGNOSIS — D689 Coagulation defect, unspecified: Secondary | ICD-10-CM | POA: Diagnosis not present

## 2022-11-06 ENCOUNTER — Encounter: Payer: Self-pay | Admitting: Family Medicine

## 2022-11-06 ENCOUNTER — Ambulatory Visit (INDEPENDENT_AMBULATORY_CARE_PROVIDER_SITE_OTHER): Payer: Medicare HMO | Admitting: Family Medicine

## 2022-11-06 VITALS — BP 122/68 | HR 81 | Temp 97.9°F | Resp 17 | Ht 67.0 in | Wt 181.4 lb

## 2022-11-06 DIAGNOSIS — I5022 Chronic systolic (congestive) heart failure: Secondary | ICD-10-CM | POA: Diagnosis not present

## 2022-11-06 DIAGNOSIS — Z515 Encounter for palliative care: Secondary | ICD-10-CM

## 2022-11-06 DIAGNOSIS — N186 End stage renal disease: Secondary | ICD-10-CM | POA: Diagnosis not present

## 2022-11-06 NOTE — Progress Notes (Signed)
   Subjective:    Patient ID: Tyler Hahn, male    DOB: 03-29-1941, 82 y.o.   MRN: [id number]  HPI ER f/u- pt went to ER on 4/16 w/ leg swelling and pain.  No obvious cause for either identified.  Son reports legs are 'tight and swollen'.  Can be painful at times.  No swelling of hands.  Denies SOB or wet cough.  Pt gets HD 3x/week- they report they remove 'as much fluid as his heart can take' (meaning blood pressure)  Son wants to know if there's anything else to do.  He talked w/ Cards about end of life issues and they are deciding whether to turn off his ICD.  Son states he is nodding off more frequently- in fact he slept through most of our appt   Review of Systems For ROS see HPI     Objective:   Physical Exam Vitals reviewed.  Constitutional:      General: He is not in acute distress.    Comments: Sleeping through most of visit  Cardiovascular:     Rate and Rhythm: Normal rate.  Pulmonary:     Effort: Pulmonary effort is normal. No respiratory distress.  Musculoskeletal:     Right lower leg: Edema (2+ pitting edema) present.     Left lower leg: Edema (2+ pitting edema) present.  Skin:    General: Skin is warm and dry.  Psychiatric:        Mood and Affect: Mood normal.           Assessment & Plan:  End of life care- new.  Son has had this conversation w/ cardiology and we had this again today.  He is likely going to turn off his defibrillator as pt wishes to be a DNR.  Son is understandably having a hard time with this but knows it is the right course of action.  Pt is at peace and our goal is comfort and quality over quantity.  Son to decide if a Hospice referral is desired/needed.  Happy to order at any time.  Will continue to follow along.

## 2022-11-06 NOTE — Patient Instructions (Addendum)
Follow up as needed or as scheduled Try and get the lowest degree of compression socks and keep feet elevated If his blood pressure is good on a dialysis day, maybe they can take more fluid Call with any questions or concerns Hang in there!!!

## 2022-11-07 DIAGNOSIS — E876 Hypokalemia: Secondary | ICD-10-CM | POA: Diagnosis not present

## 2022-11-07 DIAGNOSIS — N2581 Secondary hyperparathyroidism of renal origin: Secondary | ICD-10-CM | POA: Diagnosis not present

## 2022-11-07 DIAGNOSIS — D689 Coagulation defect, unspecified: Secondary | ICD-10-CM | POA: Diagnosis not present

## 2022-11-07 DIAGNOSIS — N186 End stage renal disease: Secondary | ICD-10-CM | POA: Diagnosis not present

## 2022-11-07 DIAGNOSIS — D631 Anemia in chronic kidney disease: Secondary | ICD-10-CM | POA: Diagnosis not present

## 2022-11-07 DIAGNOSIS — Z992 Dependence on renal dialysis: Secondary | ICD-10-CM | POA: Diagnosis not present

## 2022-11-08 NOTE — Assessment & Plan Note (Addendum)
Chronic problem.  HD is limited as to the amount of fluid they can pull based on hypotension.  Pt is nodding off regularly so unclear if he is becoming uremic.  Will continue to follow along.

## 2022-11-08 NOTE — Assessment & Plan Note (Addendum)
Ongoing issue for pt.  He is now in the tenuous position of being volume overloaded but w/ soft BP.  HD is limited in the amount of fluid they can pull due to hypotension.  Legs are 2+ bilaterally and TTP.  He is anuric so diuretics would not be beneficial.  Encouraged elevation when sitting/sleeping and if possible, light compression socks.  Son is aware that this is a difficult line to walk and that the end is approaching.  He has also had this conversation w/ Cardiology.  Will continue to provide support.

## 2022-11-09 DIAGNOSIS — E876 Hypokalemia: Secondary | ICD-10-CM | POA: Diagnosis not present

## 2022-11-09 DIAGNOSIS — N186 End stage renal disease: Secondary | ICD-10-CM | POA: Diagnosis not present

## 2022-11-09 DIAGNOSIS — D631 Anemia in chronic kidney disease: Secondary | ICD-10-CM | POA: Diagnosis not present

## 2022-11-09 DIAGNOSIS — N2581 Secondary hyperparathyroidism of renal origin: Secondary | ICD-10-CM | POA: Diagnosis not present

## 2022-11-09 DIAGNOSIS — Z992 Dependence on renal dialysis: Secondary | ICD-10-CM | POA: Diagnosis not present

## 2022-11-09 DIAGNOSIS — D689 Coagulation defect, unspecified: Secondary | ICD-10-CM | POA: Diagnosis not present

## 2022-11-12 DIAGNOSIS — D631 Anemia in chronic kidney disease: Secondary | ICD-10-CM | POA: Diagnosis not present

## 2022-11-12 DIAGNOSIS — N2581 Secondary hyperparathyroidism of renal origin: Secondary | ICD-10-CM | POA: Diagnosis not present

## 2022-11-12 DIAGNOSIS — D689 Coagulation defect, unspecified: Secondary | ICD-10-CM | POA: Diagnosis not present

## 2022-11-12 DIAGNOSIS — Z992 Dependence on renal dialysis: Secondary | ICD-10-CM | POA: Diagnosis not present

## 2022-11-12 DIAGNOSIS — N186 End stage renal disease: Secondary | ICD-10-CM | POA: Diagnosis not present

## 2022-11-12 DIAGNOSIS — E876 Hypokalemia: Secondary | ICD-10-CM | POA: Diagnosis not present

## 2022-11-14 DIAGNOSIS — D631 Anemia in chronic kidney disease: Secondary | ICD-10-CM | POA: Diagnosis not present

## 2022-11-14 DIAGNOSIS — E876 Hypokalemia: Secondary | ICD-10-CM | POA: Diagnosis not present

## 2022-11-14 DIAGNOSIS — N186 End stage renal disease: Secondary | ICD-10-CM | POA: Diagnosis not present

## 2022-11-14 DIAGNOSIS — N2581 Secondary hyperparathyroidism of renal origin: Secondary | ICD-10-CM | POA: Diagnosis not present

## 2022-11-14 DIAGNOSIS — Z992 Dependence on renal dialysis: Secondary | ICD-10-CM | POA: Diagnosis not present

## 2022-11-14 DIAGNOSIS — D689 Coagulation defect, unspecified: Secondary | ICD-10-CM | POA: Diagnosis not present

## 2022-11-15 DIAGNOSIS — Z992 Dependence on renal dialysis: Secondary | ICD-10-CM | POA: Diagnosis not present

## 2022-11-15 DIAGNOSIS — N186 End stage renal disease: Secondary | ICD-10-CM | POA: Diagnosis not present

## 2022-11-15 DIAGNOSIS — I129 Hypertensive chronic kidney disease with stage 1 through stage 4 chronic kidney disease, or unspecified chronic kidney disease: Secondary | ICD-10-CM | POA: Diagnosis not present

## 2022-11-16 DIAGNOSIS — N2581 Secondary hyperparathyroidism of renal origin: Secondary | ICD-10-CM | POA: Diagnosis not present

## 2022-11-16 DIAGNOSIS — E876 Hypokalemia: Secondary | ICD-10-CM | POA: Diagnosis not present

## 2022-11-16 DIAGNOSIS — Z992 Dependence on renal dialysis: Secondary | ICD-10-CM | POA: Diagnosis not present

## 2022-11-16 DIAGNOSIS — N186 End stage renal disease: Secondary | ICD-10-CM | POA: Diagnosis not present

## 2022-11-16 DIAGNOSIS — D689 Coagulation defect, unspecified: Secondary | ICD-10-CM | POA: Diagnosis not present

## 2022-11-16 DIAGNOSIS — D631 Anemia in chronic kidney disease: Secondary | ICD-10-CM | POA: Diagnosis not present

## 2022-11-19 DIAGNOSIS — D631 Anemia in chronic kidney disease: Secondary | ICD-10-CM | POA: Diagnosis not present

## 2022-11-19 DIAGNOSIS — E876 Hypokalemia: Secondary | ICD-10-CM | POA: Diagnosis not present

## 2022-11-19 DIAGNOSIS — N2581 Secondary hyperparathyroidism of renal origin: Secondary | ICD-10-CM | POA: Diagnosis not present

## 2022-11-19 DIAGNOSIS — Z992 Dependence on renal dialysis: Secondary | ICD-10-CM | POA: Diagnosis not present

## 2022-11-19 DIAGNOSIS — D689 Coagulation defect, unspecified: Secondary | ICD-10-CM | POA: Diagnosis not present

## 2022-11-19 DIAGNOSIS — N186 End stage renal disease: Secondary | ICD-10-CM | POA: Diagnosis not present

## 2022-11-21 DIAGNOSIS — Z992 Dependence on renal dialysis: Secondary | ICD-10-CM | POA: Diagnosis not present

## 2022-11-21 DIAGNOSIS — D631 Anemia in chronic kidney disease: Secondary | ICD-10-CM | POA: Diagnosis not present

## 2022-11-21 DIAGNOSIS — N186 End stage renal disease: Secondary | ICD-10-CM | POA: Diagnosis not present

## 2022-11-21 DIAGNOSIS — N2581 Secondary hyperparathyroidism of renal origin: Secondary | ICD-10-CM | POA: Diagnosis not present

## 2022-11-21 DIAGNOSIS — D689 Coagulation defect, unspecified: Secondary | ICD-10-CM | POA: Diagnosis not present

## 2022-11-21 DIAGNOSIS — E876 Hypokalemia: Secondary | ICD-10-CM | POA: Diagnosis not present

## 2022-11-23 ENCOUNTER — Other Ambulatory Visit: Payer: Self-pay | Admitting: Family Medicine

## 2022-11-23 DIAGNOSIS — Z992 Dependence on renal dialysis: Secondary | ICD-10-CM | POA: Diagnosis not present

## 2022-11-23 DIAGNOSIS — N186 End stage renal disease: Secondary | ICD-10-CM | POA: Diagnosis not present

## 2022-11-23 DIAGNOSIS — N2581 Secondary hyperparathyroidism of renal origin: Secondary | ICD-10-CM | POA: Diagnosis not present

## 2022-11-23 DIAGNOSIS — D689 Coagulation defect, unspecified: Secondary | ICD-10-CM | POA: Diagnosis not present

## 2022-11-23 DIAGNOSIS — D631 Anemia in chronic kidney disease: Secondary | ICD-10-CM | POA: Diagnosis not present

## 2022-11-23 DIAGNOSIS — E876 Hypokalemia: Secondary | ICD-10-CM | POA: Diagnosis not present

## 2022-11-26 DIAGNOSIS — Z992 Dependence on renal dialysis: Secondary | ICD-10-CM | POA: Diagnosis not present

## 2022-11-26 DIAGNOSIS — D631 Anemia in chronic kidney disease: Secondary | ICD-10-CM | POA: Diagnosis not present

## 2022-11-26 DIAGNOSIS — N2581 Secondary hyperparathyroidism of renal origin: Secondary | ICD-10-CM | POA: Diagnosis not present

## 2022-11-26 DIAGNOSIS — E876 Hypokalemia: Secondary | ICD-10-CM | POA: Diagnosis not present

## 2022-11-26 DIAGNOSIS — N186 End stage renal disease: Secondary | ICD-10-CM | POA: Diagnosis not present

## 2022-11-26 DIAGNOSIS — D689 Coagulation defect, unspecified: Secondary | ICD-10-CM | POA: Diagnosis not present

## 2022-11-27 ENCOUNTER — Telehealth: Payer: Self-pay | Admitting: Family Medicine

## 2022-11-27 ENCOUNTER — Ambulatory Visit (INDEPENDENT_AMBULATORY_CARE_PROVIDER_SITE_OTHER): Payer: Medicare HMO

## 2022-11-27 ENCOUNTER — Other Ambulatory Visit: Payer: Self-pay | Admitting: Family Medicine

## 2022-11-27 DIAGNOSIS — I428 Other cardiomyopathies: Secondary | ICD-10-CM | POA: Diagnosis not present

## 2022-11-27 DIAGNOSIS — K219 Gastro-esophageal reflux disease without esophagitis: Secondary | ICD-10-CM

## 2022-11-27 LAB — CUP PACEART REMOTE DEVICE CHECK
Battery Remaining Longevity: 150 mo
Battery Remaining Percentage: 100 %
Brady Statistic RA Percent Paced: 0 %
Brady Statistic RV Percent Paced: 2 %
Date Time Interrogation Session: 20240612050100
HighPow Impedance: 37 Ohm
Implantable Lead Connection Status: 753985
Implantable Lead Connection Status: 753985
Implantable Lead Implant Date: 20091218
Implantable Lead Implant Date: 20091218
Implantable Lead Location: 753859
Implantable Lead Location: 753860
Implantable Lead Model: 158
Implantable Lead Serial Number: 131093
Implantable Pulse Generator Implant Date: 20220523
Lead Channel Impedance Value: 324 Ohm
Lead Channel Impedance Value: 417 Ohm
Lead Channel Setting Pacing Amplitude: 2 V
Lead Channel Setting Pacing Amplitude: 2.4 V
Lead Channel Setting Pacing Pulse Width: 0.4 ms
Lead Channel Setting Sensing Sensitivity: 0.5 mV
Pulse Gen Serial Number: 232629

## 2022-11-27 MED ORDER — OMEPRAZOLE 20 MG PO CPDR: 20.0000 mg | DELAYED_RELEASE_CAPSULE | Freq: Every day | ORAL | 1 refills | Status: DC

## 2022-11-27 NOTE — Telephone Encounter (Signed)
Pt's son Evert Wenrich has called the office in regards to the pt's medication and would like to receive a call back.  Christiane [phone]

## 2022-11-27 NOTE — Telephone Encounter (Signed)
Ok to refill Prilosec (Omeprazole)- #90, 1 refill

## 2022-11-27 NOTE — Telephone Encounter (Signed)
Pt son called and states  that the cardiologist stopped his father's Furosamide and Prilosec . He state his father needs the Prilosec can we send in

## 2022-11-27 NOTE — Telephone Encounter (Signed)
Sent patients son informed

## 2022-11-28 DIAGNOSIS — E876 Hypokalemia: Secondary | ICD-10-CM | POA: Diagnosis not present

## 2022-11-28 DIAGNOSIS — N2581 Secondary hyperparathyroidism of renal origin: Secondary | ICD-10-CM | POA: Diagnosis not present

## 2022-11-28 DIAGNOSIS — N186 End stage renal disease: Secondary | ICD-10-CM | POA: Diagnosis not present

## 2022-11-28 DIAGNOSIS — D631 Anemia in chronic kidney disease: Secondary | ICD-10-CM | POA: Diagnosis not present

## 2022-11-28 DIAGNOSIS — Z992 Dependence on renal dialysis: Secondary | ICD-10-CM | POA: Diagnosis not present

## 2022-11-28 DIAGNOSIS — D689 Coagulation defect, unspecified: Secondary | ICD-10-CM | POA: Diagnosis not present

## 2022-11-30 DIAGNOSIS — D689 Coagulation defect, unspecified: Secondary | ICD-10-CM | POA: Diagnosis not present

## 2022-11-30 DIAGNOSIS — E876 Hypokalemia: Secondary | ICD-10-CM | POA: Diagnosis not present

## 2022-11-30 DIAGNOSIS — N2581 Secondary hyperparathyroidism of renal origin: Secondary | ICD-10-CM | POA: Diagnosis not present

## 2022-11-30 DIAGNOSIS — D631 Anemia in chronic kidney disease: Secondary | ICD-10-CM | POA: Diagnosis not present

## 2022-11-30 DIAGNOSIS — Z992 Dependence on renal dialysis: Secondary | ICD-10-CM | POA: Diagnosis not present

## 2022-11-30 DIAGNOSIS — N186 End stage renal disease: Secondary | ICD-10-CM | POA: Diagnosis not present

## 2022-12-01 ENCOUNTER — Other Ambulatory Visit: Payer: Self-pay | Admitting: Family Medicine

## 2022-12-02 NOTE — Telephone Encounter (Addendum)
Pt has appt for physical on 6/20, last refill 4/22 (45,2) for 90 day supply

## 2022-12-03 DIAGNOSIS — E876 Hypokalemia: Secondary | ICD-10-CM | POA: Diagnosis not present

## 2022-12-03 DIAGNOSIS — N2581 Secondary hyperparathyroidism of renal origin: Secondary | ICD-10-CM | POA: Diagnosis not present

## 2022-12-03 DIAGNOSIS — Z992 Dependence on renal dialysis: Secondary | ICD-10-CM | POA: Diagnosis not present

## 2022-12-03 DIAGNOSIS — D631 Anemia in chronic kidney disease: Secondary | ICD-10-CM | POA: Diagnosis not present

## 2022-12-03 DIAGNOSIS — N186 End stage renal disease: Secondary | ICD-10-CM | POA: Diagnosis not present

## 2022-12-03 DIAGNOSIS — D689 Coagulation defect, unspecified: Secondary | ICD-10-CM | POA: Diagnosis not present

## 2022-12-04 ENCOUNTER — Ambulatory Visit (INDEPENDENT_AMBULATORY_CARE_PROVIDER_SITE_OTHER): Payer: Medicare HMO | Admitting: *Deleted

## 2022-12-04 DIAGNOSIS — Z0000 Encounter for general adult medical examination without abnormal findings: Secondary | ICD-10-CM | POA: Diagnosis not present

## 2022-12-04 NOTE — Patient Instructions (Signed)
Tyler Hahn , Thank you for taking time to come for your Medicare Wellness Visit. I appreciate your ongoing commitment to your health goals. Please review the following plan we discussed and let me know if I can assist you in the future.   Screening recommendations/referrals: Colonoscopy: no longer required Recommended yearly ophthalmology/optometry visit for glaucoma screening and checkup Recommended yearly dental visit for hygiene and checkup  Vaccinations: Influenza vaccine: up to date Pneumococcal vaccine: up to date Tdap vaccine: Education provided Shingles vaccine:  Education provided    Advanced directives: yes not on file     Preventive Care 65 Years and Older, Male Preventive care refers to lifestyle choices and visits with your health care provider that can promote health and wellness. What does preventive care include? A yearly physical exam. This is also called an annual well check. Dental exams once or twice a year. Routine eye exams. Ask your health care provider how often you should have your eyes checked. Personal lifestyle choices, including: Daily care of your teeth and gums. Regular physical activity. Eating a healthy diet. Avoiding tobacco and drug use. Limiting alcohol use. Practicing safe sex. Taking low doses of aspirin every day. Taking vitamin and mineral supplements as recommended by your health care provider. What happens during an annual well check? The services and screenings done by your health care provider during your annual well check will depend on your age, overall health, lifestyle risk factors, and family history of disease. Counseling  Your health care provider may ask you questions about your: Alcohol use. Tobacco use. Drug use. Emotional well-being. Home and relationship well-being. Sexual activity. Eating habits. History of falls. Memory and ability to understand (cognition). Work and work Astronomer. Screening  You may have  the following tests or measurements: Height, weight, and BMI. Blood pressure. Lipid and cholesterol levels. These may be checked every 5 years, or more frequently if you are over 79 years old. Skin check. Lung cancer screening. You may have this screening every year starting at age 25 if you have a 30-pack-year history of smoking and currently smoke or have quit within the past 15 years. Fecal occult blood test (FOBT) of the stool. You may have this test every year starting at age 63. Flexible sigmoidoscopy or colonoscopy. You may have a sigmoidoscopy every 5 years or a colonoscopy every 10 years starting at age 66. Prostate cancer screening. Recommendations will vary depending on your family history and other risks. Hepatitis C blood test. Hepatitis B blood test. Sexually transmitted disease (STD) testing. Diabetes screening. This is done by checking your blood sugar (glucose) after you have not eaten for a while (fasting). You may have this done every 1-3 years. Abdominal aortic aneurysm (AAA) screening. You may need this if you are a current or former smoker. Osteoporosis. You may be screened starting at age 36 if you are at high risk. Talk with your health care provider about your test results, treatment options, and if necessary, the need for more tests. Vaccines  Your health care provider may recommend certain vaccines, such as: Influenza vaccine. This is recommended every year. Tetanus, diphtheria, and acellular pertussis (Tdap, Td) vaccine. You may need a Td booster every 10 years. Zoster vaccine. You may need this after age 90. Pneumococcal 13-valent conjugate (PCV13) vaccine. One dose is recommended after age 20. Pneumococcal polysaccharide (PPSV23) vaccine. One dose is recommended after age 64. Talk to your health care provider about which screenings and vaccines you need and how often you  need them. This information is not intended to replace advice given to you by your health  care provider. Make sure you discuss any questions you have with your health care provider. Document Released: 06/30/2015 Document Revised: 02/21/2016 Document Reviewed: 04/04/2015 Elsevier Interactive Patient Education  2017 [employer] Prevention in the Home Falls can cause injuries. They can happen to people of all ages. There are many things you can do to make your home safe and to help prevent falls. What can I do on the outside of my home? Regularly fix the edges of walkways and driveways and fix any cracks. Remove anything that might make you trip as you walk through a door, such as a raised step or threshold. Trim any bushes or trees on the path to your home. Use bright outdoor lighting. Clear any walking paths of anything that might make someone trip, such as rocks or tools. Regularly check to see if handrails are loose or broken. Make sure that both sides of any steps have handrails. Any raised decks and porches should have guardrails on the edges. Have any leaves, snow, or ice cleared regularly. Use sand or salt on walking paths during winter. Clean up any spills in your garage right away. This includes oil or grease spills. What can I do in the bathroom? Use night lights. Install grab bars by the toilet and in the tub and shower. Do not use towel bars as grab bars. Use non-skid mats or decals in the tub or shower. If you need to sit down in the shower, use a plastic, non-slip stool. Keep the floor dry. Clean up any water that spills on the floor as soon as it happens. Remove soap buildup in the tub or shower regularly. Attach bath mats securely with double-sided non-slip rug tape. Do not have throw rugs and other things on the floor that can make you trip. What can I do in the bedroom? Use night lights. Make sure that you have a light by your bed that is easy to reach. Do not use any sheets or blankets that are too big for your bed. They should not hang down onto the  floor. Have a firm chair that has side arms. You can use this for support while you get dressed. Do not have throw rugs and other things on the floor that can make you trip. What can I do in the kitchen? Clean up any spills right away. Avoid walking on wet floors. Keep items that you use a lot in easy-to-reach places. If you need to reach something above you, use a strong step stool that has a grab bar. Keep electrical cords out of the way. Do not use floor polish or wax that makes floors slippery. If you must use wax, use non-skid floor wax. Do not have throw rugs and other things on the floor that can make you trip. What can I do with my stairs? Do not leave any items on the stairs. Make sure that there are handrails on both sides of the stairs and use them. Fix handrails that are broken or loose. Make sure that handrails are as long as the stairways. Check any carpeting to make sure that it is firmly attached to the stairs. Fix any carpet that is loose or worn. Avoid having throw rugs at the top or bottom of the stairs. If you do have throw rugs, attach them to the floor with carpet tape. Make sure that you have a light switch  at the top of the stairs and the bottom of the stairs. If you do not have them, ask someone to add them for you. What else can I do to help prevent falls? Wear shoes that: Do not have high heels. Have rubber bottoms. Are comfortable and fit you well. Are closed at the toe. Do not wear sandals. If you use a stepladder: Make sure that it is fully opened. Do not climb a closed stepladder. Make sure that both sides of the stepladder are locked into place. Ask someone to hold it for you, if possible. Clearly mark and make sure that you can see: Any grab bars or handrails. First and last steps. Where the edge of each step is. Use tools that help you move around (mobility aids) if they are needed. These include: Canes. Walkers. Scooters. Crutches. Turn on the  lights when you go into a dark area. Replace any light bulbs as soon as they burn out. Set up your furniture so you have a clear path. Avoid moving your furniture around. If any of your floors are uneven, fix them. If there are any pets around you, be aware of where they are. Review your medicines with your doctor. Some medicines can make you feel dizzy. This can increase your chance of falling. Ask your doctor what other things that you can do to help prevent falls. This information is not intended to replace advice given to you by your health care provider. Make sure you discuss any questions you have with your health care provider. Document Released: 03/30/2009 Document Revised: 11/09/2015 Document Reviewed: 07/08/2014 Elsevier Interactive Patient Education  2017 [employer].

## 2022-12-04 NOTE — Progress Notes (Signed)
Subjective:   Tyler Hahn is a 82 y.o. male who presents for Medicare Annual/Subsequent preventive examination.  Visit Complete: Telephone  I connected with  Durwin Nora and son Christiane Ha assisted on 12/04/22 by a audio enabled telemedicine application and verified that I am speaking with the correct person using two identifiers.  Patient Location: Home  Provider Location: Home Office  I discussed the limitations of evaluation and management by telemedicine. The patient expressed understanding and agreed to proceed.    Review of Systems     Cardiac Risk Factors include: advanced age (>28men, >81 women);male gender;diabetes mellitus;hypertension     Objective:    Today's Vitals   There is no height or weight on file to calculate BMI.     12/04/2022    1:41 PM 10/01/2022   12:25 AM 05/31/2022    3:46 PM 04/16/2022    3:13 AM 12/26/2021   10:02 AM 11/06/2020    8:18 AM 10/16/2020    9:59 AM  Advanced Directives  Does Patient Have a Medical Advance Directive? Yes No No No Yes No No  Type of Psychiatrist of [place];Living will    Copy of Healthcare Power of Attorney in Chart? No - copy requested    No - copy requested    Would patient like information on creating a medical advance directive?  No - Patient declined No - Patient declined No - Patient declined  No - Patient declined No - Patient declined    Current Medications (verified) Outpatient Encounter Medications as of 12/04/2022  Medication Sig   allopurinol (ZYLOPRIM) 100 MG tablet TAKE 1 TABLET BY MOUTH 3 TIMES A WEEK ON MONDAY, WEDNESDAY AND FRIDAY   atorvastatin (LIPITOR) 20 MG tablet Take 1 tablet (20 mg total) by mouth daily.   donepezil (ARICEPT) 10 MG tablet TAKE 1 TABLET BY MOUTH EVERYDAY AT BEDTIME   levothyroxine (SYNTHROID) 50 MCG tablet Take 1 tablet (50 mcg total) by mouth daily.   loratadine (CLARITIN) 10 MG tablet Take 1 tablet (10 mg total)  by mouth daily.   Multiple Vitamins-Minerals (MULTIVITAMIN WITH MINERALS) tablet Take 1 tablet by mouth daily.   omeprazole (PRILOSEC) 20 MG capsule Take 1 capsule (20 mg total) by mouth daily.   ondansetron (ZOFRAN-ODT) 4 MG disintegrating tablet 4mg  ODT q4 hours prn nausea/vomit   sertraline (ZOLOFT) 100 MG tablet TAKE 1.5 TABLETS (150MG  TOTAL) BY MOUTH DAILY   No facility-administered encounter medications on file as of 12/04/2022.    Allergies (verified) Sulfa antibiotics and Sulfonamide derivatives   History: Past Medical History:  Diagnosis Date   Anxiety    Automatic implantable cardioverter-defibrillator in situ    greg taylor   CHF (congestive heart failure) (HCC)    2000   Diabetes mellitus    no meds   Fatty liver    Gout    "bout 2-3 months ago"-meds helped.   Hyperlipidemia    Hypertension    Hyperthyroidism    Kidney cysts    PONV (postoperative nausea and vomiting)    Renal insufficiency    Past Surgical History:  Procedure Laterality Date   AV FISTULA PLACEMENT Right 08/23/2019   Procedure: ARTERIOVENOUS GORTEX GRAFT RIGHT ARM;  Surgeon: Larina Earthly, MD;  Location: [facility] OR;  Service: Vascular;  Laterality: Right;   CARDIAC DEFIBRILLATOR PLACEMENT     catract     COLONOSCOPY WITH PROPOFOL N/A 10/03/2015   Procedure: COLONOSCOPY WITH PROPOFOL;  Surgeon: Meryl Dare, MD;  Location: [facility];  Service: Endoscopy;  Laterality: N/A;   DIAGNOSTIC LAPAROSCOPY  01/27/2014   Dr Luisa Hart   ENTEROSCOPY N/A 11/25/2013   Procedure: ENTEROSCOPY;  Surgeon: Meryl Dare, MD;  Location: [facility];  Service: Endoscopy;  Laterality: N/A;   ICD GENERATOR CHANGEOUT N/A 11/06/2020   Procedure: ICD GENERATOR CHANGEOUT;  Surgeon: Duke Salvia, MD;  [facility]: [facility] INVASIVE CV LAB;  Service: Cardiovascular;  Laterality: N/A;   ICD,[place] [employer]     LAPAROSCOPY N/A 01/27/2014   Procedure: LAPAROSCOPY DIAGNOSTIC;  Surgeon: Clovis Pu. Cornett, MD;  Location: [facility] OR;   Service: General;  Laterality: N/A;   polyp removal throat     difficulty speaking   Family History  Problem Relation Age of Onset   Stomach cancer Mother    Alzheimer's disease Mother    Diabetes Father    Heart disease Father    Liver disease Father    Kidney disease Father    Social History   Socioeconomic History   Marital status: Widowed    Spouse name: Not on file   Number of children: 4   Years of education: Not on file   Highest education level: Not on file  Occupational History   Occupation: Retired    Associate Professor: RETIRED  Tobacco Use   Smoking status: Former    Types: Cigarettes    Quit date: 11/26/1998    Years since quitting: 24.0   Smokeless tobacco: Never  Vaping Use   Vaping Use: Never used  Substance and Sexual Activity   Alcohol use: No    Alcohol/week: 0.0 standard drinks of alcohol   Drug use: No   Sexual activity: Not Currently  Other Topics Concern   Not on file  Social History Narrative   Not on file   Social Determinants of Health   Financial Resource Strain: Low Risk  (12/04/2022)   Overall Financial Resource Strain (CARDIA)    Difficulty of Paying Living Expenses: Not hard at all  Food Insecurity: No Food Insecurity (12/04/2022)   Hunger Vital Sign    Worried About Running Out of Food in the Last Year: Never true    Ran Out of Food in the Last Year: Never true  Transportation Needs: No Transportation Needs (12/04/2022)   PRAPARE - Administrator, Civil Service (Medical): No    Lack of Transportation (Non-Medical): No  Physical Activity: Insufficiently Active (12/04/2022)   Exercise Vital Sign    Days of Exercise per Week: 3 days    Minutes of Exercise per Session: 20 min  Stress: No Stress Concern Present (12/04/2022)   [employer] of Occupational Health - Occupational Stress Questionnaire    Feeling of Stress : Not at all  Social Connections: Moderately Integrated (12/04/2022)   Social Connection and Isolation Panel  [NHANES]    Frequency of Communication with Friends and Family: Once a week    Frequency of Social Gatherings with Friends and Family: Twice a week    Attends Religious Services: 1 to 4 times per year    Active Member of [employer] or Organizations: Yes    Attends Banker Meetings: 1 to 4 times per year    Marital Status: Widowed    Tobacco Counseling Counseling given: Not Answered   Clinical Intake:  Pre-visit preparation completed: Yes  Pain : No/denies pain     Diabetes: Yes CBG done?: No Did pt. bring in CBG monitor from home?: No  How often do you need to have someone help you when you read instructions, pamphlets, or other written materials from your doctor or pharmacy?: 2 - Rarely  Interpreter Needed?: No  Information entered by :: Remi Haggard LPN   Activities of Daily Living    12/04/2022    1:38 PM 12/26/2021   10:05 AM  In your present state of health, do you have any difficulty performing the following activities:  Hearing? 0 0  Vision? 0 0  Difficulty concentrating or making decisions? 1 0  Walking or climbing stairs? 1 0  Dressing or bathing? 1 0  Doing errands, shopping? 1 0  Preparing Food and eating ? N N  Using the Toilet? N N  In the past six months, have you accidently leaked urine? Y N  Do you have problems with loss of bowel control? N N  Managing your Medications? Y N  Managing your Finances? Y N  Housekeeping or managing your Housekeeping? Y N    Patient Care Team: Sheliah Hatch, MD as PCP - General Duke Salvia, MD as PCP - Electrophysiology (Cardiology) Duke Salvia, MD as Consulting Physician (Cardiology) Romero Belling, MD (Inactive) as Consulting Physician (Endocrinology) Meryl Dare, MD as Consulting Physician (Gastroenterology) Maxie Barb, MD as Consulting Physician (Nephrology) Erroll Luna, [facility] (Inactive) (Pharmacist)  Indicate any recent Medical Services you may have received from  other than [facility] providers in the past year (date may be approximate).     Assessment:   This is a routine wellness examination for Torao.  Hearing/Vision screen Hearing Screening - Comments:: No trouble hearing Vision Screening - Comments:: [facility] Up to date  Dietary issues and exercise activities discussed:     Goals Addressed             This Visit's Progress    Patient Stated       Continue current lifestyle       Depression Screen    12/04/2022    1:42 PM 11/06/2022   10:08 AM 10/04/2022    1:38 PM 06/05/2022   10:44 AM 04/22/2022   10:34 AM 04/08/2022   11:17 AM 02/25/2022   10:23 AM  PHQ 2/9 Scores  PHQ - 2 Score 0 2 0 1 1 2 1   PHQ- 9 Score 7 3 10 8 2 7 5     Fall Risk    12/04/2022    1:35 PM 11/06/2022   10:08 AM 06/05/2022   10:44 AM 04/22/2022   10:34 AM 04/08/2022   11:18 AM  Fall Risk   Falls in the past year? 1 1 0 0 0  Number falls in past yr: 1 0   0  Injury with Fall? 0 0   0  Risk for fall due to :  History of fall(s) No Fall Risks No Fall Risks No Fall Risks  Follow up Falls evaluation completed;Education provided;Falls prevention discussed Falls evaluation completed Falls evaluation completed  Falls evaluation completed    MEDICARE RISK AT HOME:   TIMED UP AND GO:  Was the test performed?  No    Cognitive Function:    02/14/2015   10:17 AM  MMSE - Mini Mental State Exam  Orientation to time 5  Orientation to Place 5  Registration 3  Attention/ Calculation 4  Recall 2  Language- name 2 objects 2  Language- repeat 1  Language- follow 3 step command 3  Language- read & follow direction 1  Write a  sentence 1  Copy design 1  Total score 28      12/26/2021    3:21 PM  Montreal Cognitive Assessment   Visuospatial/ Executive (0/5) 1  Naming (0/3) 3  Attention: Read list of digits (0/2) 1  Attention: Read list of letters (0/1) 1  Attention: Serial 7 subtraction starting at 100 (0/3) 0  Language: Repeat phrase (0/2) 1   Language : Fluency (0/1) 1  Abstraction (0/2) 1  Delayed Recall (0/5) 0  Orientation (0/6) 1  Total 10      12/04/2022    1:36 PM 10/16/2020   10:05 AM  6CIT Screen  What Year? 4 points 4 points  What month? 3 points 3 points  What time? 0 points 3 points  Count back from 20 2 points 0 points  Months in reverse 4 points 4 points  Repeat phrase 10 points 10 points  Total Score 23 points 24 points    Immunizations Immunization History  Administered Date(s) Administered   Fluad Quad(high Dose 65+) 02/18/2019, 02/16/2020, 02/26/2021, 02/25/2022   Influenza Whole 04/04/2010, 06/17/2012   Influenza, High Dose Seasonal PF 03/18/2018   Influenza,inj,Quad PF,6+ Mos 03/24/2014, 05/18/2015, 07/04/2017   Moderna Sars-Covid-2 Vaccination 07/20/2019, 08/21/2019   Pneumococcal Conjugate-13 02/14/2015   Pneumococcal Polysaccharide-23 08/15/2011    TDAP status: Due, Education has been provided regarding the importance of this vaccine. Advised may receive this vaccine at local pharmacy or Health Dept. Aware to provide a copy of the vaccination record if obtained from local pharmacy or Health Dept. Verbalized acceptance and understanding.  Flu Vaccine status: Due, Education has been provided regarding the importance of this vaccine. Advised may receive this vaccine at local pharmacy or Health Dept. Aware to provide a copy of the vaccination record if obtained from local pharmacy or Health Dept. Verbalized acceptance and understanding.  Pneumococcal vaccine status: Up to date  Covid-19 vaccine status: Information provided on how to obtain vaccines.   Qualifies for Shingles Vaccine? Yes   Zostavax completed No   Shingrix Completed?: No.    Education has been provided regarding the importance of this vaccine. Patient has been advised to call insurance company to determine out of pocket expense if they have not yet received this vaccine. Advised may also receive vaccine at local pharmacy or Health  Dept. Verbalized acceptance and understanding.  Screening Tests Health Maintenance  Topic Date Due   HEMOGLOBIN A1C  12/05/2022   INFLUENZA VACCINE  01/16/2023   OPHTHALMOLOGY EXAM  04/02/2023   FOOT EXAM  04/23/2023   Pneumonia Vaccine 77+ Years old  Completed   HPV VACCINES  Aged Out   DTaP/Tdap/Td  Discontinued   COVID-19 Vaccine  Discontinued   Hepatitis C Screening  Discontinued   Zoster Vaccines- Shingrix  Discontinued    Health Maintenance  There are no preventive care reminders to display for this patient.  Colorectal cancer screening: No longer required.   Lung Cancer Screening: (Low Dose CT Chest recommended if Age 7-80 years, 20 pack-year currently smoking OR have quit w/in 15years.) does not qualify.   Lung Cancer Screening Referral:   Additional Screening:  Hepatitis C Screening: does not qualify; Completed 2021  Vision Screening: Recommended annual ophthalmology exams for early detection of glaucoma and other disorders of the eye. Is the patient up to date with their annual eye exam?  Yes  Who is the provider or what is the name of the office in which the patient attends annual eye exams? [facility] If pt  is not established with a provider, would they like to be referred to a provider to establish care? No .   Dental Screening: Recommended annual dental exams for proper oral hygiene  Diabetic Foot Exam: overdue  Nutrition Risk Assessment:  Has the patient had any N/V/D within the last 2 months?  No  Does the patient have any non-healing wounds?  No  Has the patient had any unintentional weight loss or weight gain?  No   Diabetes:  Is the patient diabetic?  Yes  If diabetic, was a CBG obtained today?  No  Did the patient bring in their glucometer from home?  No  How often do you monitor your CBG's? Gets checked at dialysis .   Financial Strains and Diabetes Management:  Are you having any financial strains with the device, your supplies or  your medication? No .  Does the patient want to be seen by Chronic Care Management for management of their diabetes?  No  Would the patient like to be referred to a Nutritionist or for Diabetic Management?  No      Community Resource Referral / Chronic Care Management: CRR required this visit?  No   CCM required this visit?  No     Plan:     I have personally reviewed and noted the following in the patient's chart:   Medical and social history Use of alcohol, tobacco or illicit drugs  Current medications and supplements including opioid prescriptions. Patient is not currently taking opioid prescriptions. Functional ability and status Nutritional status Physical activity Advanced directives List of other physicians Hospitalizations, surgeries, and ER visits in previous 12 months Vitals Screenings to include cognitive, depression, and falls Referrals and appointments  In addition, I have reviewed and discussed with patient certain preventive protocols, quality metrics, and best practice recommendations. A written personalized care plan for preventive services as well as general preventive health recommendations were provided to patient.     Remi Haggard, LPN   1/61/0960   After Visit Summary: (MyChart) Due to this being a telephonic visit, the after visit summary with patients personalized plan was offered to patient via MyChart   Nurse Notes:

## 2022-12-05 ENCOUNTER — Encounter: Payer: Medicare HMO | Admitting: Family Medicine

## 2022-12-05 ENCOUNTER — Telehealth: Payer: Self-pay

## 2022-12-05 DIAGNOSIS — D631 Anemia in chronic kidney disease: Secondary | ICD-10-CM | POA: Diagnosis not present

## 2022-12-05 DIAGNOSIS — N2581 Secondary hyperparathyroidism of renal origin: Secondary | ICD-10-CM | POA: Diagnosis not present

## 2022-12-05 DIAGNOSIS — N186 End stage renal disease: Secondary | ICD-10-CM | POA: Diagnosis not present

## 2022-12-05 DIAGNOSIS — E876 Hypokalemia: Secondary | ICD-10-CM | POA: Diagnosis not present

## 2022-12-05 DIAGNOSIS — D689 Coagulation defect, unspecified: Secondary | ICD-10-CM | POA: Diagnosis not present

## 2022-12-05 DIAGNOSIS — Z992 Dependence on renal dialysis: Secondary | ICD-10-CM | POA: Diagnosis not present

## 2022-12-05 DIAGNOSIS — E039 Hypothyroidism, unspecified: Secondary | ICD-10-CM

## 2022-12-05 NOTE — Telephone Encounter (Signed)
Error

## 2022-12-07 DIAGNOSIS — N2581 Secondary hyperparathyroidism of renal origin: Secondary | ICD-10-CM | POA: Diagnosis not present

## 2022-12-07 DIAGNOSIS — E876 Hypokalemia: Secondary | ICD-10-CM | POA: Diagnosis not present

## 2022-12-07 DIAGNOSIS — D689 Coagulation defect, unspecified: Secondary | ICD-10-CM | POA: Diagnosis not present

## 2022-12-07 DIAGNOSIS — N186 End stage renal disease: Secondary | ICD-10-CM | POA: Diagnosis not present

## 2022-12-07 DIAGNOSIS — D631 Anemia in chronic kidney disease: Secondary | ICD-10-CM | POA: Diagnosis not present

## 2022-12-07 DIAGNOSIS — Z992 Dependence on renal dialysis: Secondary | ICD-10-CM | POA: Diagnosis not present

## 2022-12-10 DIAGNOSIS — Z992 Dependence on renal dialysis: Secondary | ICD-10-CM | POA: Diagnosis not present

## 2022-12-10 DIAGNOSIS — D631 Anemia in chronic kidney disease: Secondary | ICD-10-CM | POA: Diagnosis not present

## 2022-12-10 DIAGNOSIS — D689 Coagulation defect, unspecified: Secondary | ICD-10-CM | POA: Diagnosis not present

## 2022-12-10 DIAGNOSIS — N186 End stage renal disease: Secondary | ICD-10-CM | POA: Diagnosis not present

## 2022-12-10 DIAGNOSIS — E876 Hypokalemia: Secondary | ICD-10-CM | POA: Diagnosis not present

## 2022-12-10 DIAGNOSIS — N2581 Secondary hyperparathyroidism of renal origin: Secondary | ICD-10-CM | POA: Diagnosis not present

## 2022-12-12 DIAGNOSIS — N186 End stage renal disease: Secondary | ICD-10-CM | POA: Diagnosis not present

## 2022-12-12 DIAGNOSIS — N2581 Secondary hyperparathyroidism of renal origin: Secondary | ICD-10-CM | POA: Diagnosis not present

## 2022-12-12 DIAGNOSIS — D689 Coagulation defect, unspecified: Secondary | ICD-10-CM | POA: Diagnosis not present

## 2022-12-12 DIAGNOSIS — Z992 Dependence on renal dialysis: Secondary | ICD-10-CM | POA: Diagnosis not present

## 2022-12-12 DIAGNOSIS — D631 Anemia in chronic kidney disease: Secondary | ICD-10-CM | POA: Diagnosis not present

## 2022-12-12 DIAGNOSIS — E876 Hypokalemia: Secondary | ICD-10-CM | POA: Diagnosis not present

## 2022-12-14 DIAGNOSIS — D631 Anemia in chronic kidney disease: Secondary | ICD-10-CM | POA: Diagnosis not present

## 2022-12-14 DIAGNOSIS — N186 End stage renal disease: Secondary | ICD-10-CM | POA: Diagnosis not present

## 2022-12-14 DIAGNOSIS — D689 Coagulation defect, unspecified: Secondary | ICD-10-CM | POA: Diagnosis not present

## 2022-12-14 DIAGNOSIS — Z992 Dependence on renal dialysis: Secondary | ICD-10-CM | POA: Diagnosis not present

## 2022-12-14 DIAGNOSIS — E876 Hypokalemia: Secondary | ICD-10-CM | POA: Diagnosis not present

## 2022-12-14 DIAGNOSIS — N2581 Secondary hyperparathyroidism of renal origin: Secondary | ICD-10-CM | POA: Diagnosis not present

## 2022-12-15 DIAGNOSIS — N186 End stage renal disease: Secondary | ICD-10-CM | POA: Diagnosis not present

## 2022-12-15 DIAGNOSIS — Z992 Dependence on renal dialysis: Secondary | ICD-10-CM | POA: Diagnosis not present

## 2022-12-15 DIAGNOSIS — I129 Hypertensive chronic kidney disease with stage 1 through stage 4 chronic kidney disease, or unspecified chronic kidney disease: Secondary | ICD-10-CM | POA: Diagnosis not present

## 2022-12-17 DIAGNOSIS — E1122 Type 2 diabetes mellitus with diabetic chronic kidney disease: Secondary | ICD-10-CM | POA: Diagnosis not present

## 2022-12-17 DIAGNOSIS — E876 Hypokalemia: Secondary | ICD-10-CM | POA: Diagnosis not present

## 2022-12-17 DIAGNOSIS — Z992 Dependence on renal dialysis: Secondary | ICD-10-CM | POA: Diagnosis not present

## 2022-12-17 DIAGNOSIS — D631 Anemia in chronic kidney disease: Secondary | ICD-10-CM | POA: Diagnosis not present

## 2022-12-17 DIAGNOSIS — D689 Coagulation defect, unspecified: Secondary | ICD-10-CM | POA: Diagnosis not present

## 2022-12-17 DIAGNOSIS — N2581 Secondary hyperparathyroidism of renal origin: Secondary | ICD-10-CM | POA: Diagnosis not present

## 2022-12-17 DIAGNOSIS — N186 End stage renal disease: Secondary | ICD-10-CM | POA: Diagnosis not present

## 2022-12-19 DIAGNOSIS — N186 End stage renal disease: Secondary | ICD-10-CM | POA: Diagnosis not present

## 2022-12-19 DIAGNOSIS — N2581 Secondary hyperparathyroidism of renal origin: Secondary | ICD-10-CM | POA: Diagnosis not present

## 2022-12-19 DIAGNOSIS — D689 Coagulation defect, unspecified: Secondary | ICD-10-CM | POA: Diagnosis not present

## 2022-12-19 DIAGNOSIS — E876 Hypokalemia: Secondary | ICD-10-CM | POA: Diagnosis not present

## 2022-12-19 DIAGNOSIS — Z992 Dependence on renal dialysis: Secondary | ICD-10-CM | POA: Diagnosis not present

## 2022-12-19 DIAGNOSIS — E1122 Type 2 diabetes mellitus with diabetic chronic kidney disease: Secondary | ICD-10-CM | POA: Diagnosis not present

## 2022-12-19 DIAGNOSIS — D631 Anemia in chronic kidney disease: Secondary | ICD-10-CM | POA: Diagnosis not present

## 2022-12-21 DIAGNOSIS — N186 End stage renal disease: Secondary | ICD-10-CM | POA: Diagnosis not present

## 2022-12-21 DIAGNOSIS — N2581 Secondary hyperparathyroidism of renal origin: Secondary | ICD-10-CM | POA: Diagnosis not present

## 2022-12-21 DIAGNOSIS — E1122 Type 2 diabetes mellitus with diabetic chronic kidney disease: Secondary | ICD-10-CM | POA: Diagnosis not present

## 2022-12-21 DIAGNOSIS — D689 Coagulation defect, unspecified: Secondary | ICD-10-CM | POA: Diagnosis not present

## 2022-12-21 DIAGNOSIS — Z992 Dependence on renal dialysis: Secondary | ICD-10-CM | POA: Diagnosis not present

## 2022-12-21 DIAGNOSIS — E876 Hypokalemia: Secondary | ICD-10-CM | POA: Diagnosis not present

## 2022-12-21 DIAGNOSIS — D631 Anemia in chronic kidney disease: Secondary | ICD-10-CM | POA: Diagnosis not present

## 2022-12-24 DIAGNOSIS — E1122 Type 2 diabetes mellitus with diabetic chronic kidney disease: Secondary | ICD-10-CM | POA: Diagnosis not present

## 2022-12-24 DIAGNOSIS — N2581 Secondary hyperparathyroidism of renal origin: Secondary | ICD-10-CM | POA: Diagnosis not present

## 2022-12-24 DIAGNOSIS — N186 End stage renal disease: Secondary | ICD-10-CM | POA: Diagnosis not present

## 2022-12-24 DIAGNOSIS — Z992 Dependence on renal dialysis: Secondary | ICD-10-CM | POA: Diagnosis not present

## 2022-12-24 DIAGNOSIS — E876 Hypokalemia: Secondary | ICD-10-CM | POA: Diagnosis not present

## 2022-12-24 DIAGNOSIS — D631 Anemia in chronic kidney disease: Secondary | ICD-10-CM | POA: Diagnosis not present

## 2022-12-24 DIAGNOSIS — D689 Coagulation defect, unspecified: Secondary | ICD-10-CM | POA: Diagnosis not present

## 2022-12-24 NOTE — Progress Notes (Signed)
Remote ICD transmission.   

## 2022-12-26 DIAGNOSIS — D689 Coagulation defect, unspecified: Secondary | ICD-10-CM | POA: Diagnosis not present

## 2022-12-26 DIAGNOSIS — D631 Anemia in chronic kidney disease: Secondary | ICD-10-CM | POA: Diagnosis not present

## 2022-12-26 DIAGNOSIS — N186 End stage renal disease: Secondary | ICD-10-CM | POA: Diagnosis not present

## 2022-12-26 DIAGNOSIS — N2581 Secondary hyperparathyroidism of renal origin: Secondary | ICD-10-CM | POA: Diagnosis not present

## 2022-12-26 DIAGNOSIS — E1122 Type 2 diabetes mellitus with diabetic chronic kidney disease: Secondary | ICD-10-CM | POA: Diagnosis not present

## 2022-12-26 DIAGNOSIS — Z992 Dependence on renal dialysis: Secondary | ICD-10-CM | POA: Diagnosis not present

## 2022-12-26 DIAGNOSIS — E876 Hypokalemia: Secondary | ICD-10-CM | POA: Diagnosis not present

## 2022-12-28 DIAGNOSIS — E1122 Type 2 diabetes mellitus with diabetic chronic kidney disease: Secondary | ICD-10-CM | POA: Diagnosis not present

## 2022-12-28 DIAGNOSIS — N186 End stage renal disease: Secondary | ICD-10-CM | POA: Diagnosis not present

## 2022-12-28 DIAGNOSIS — N2581 Secondary hyperparathyroidism of renal origin: Secondary | ICD-10-CM | POA: Diagnosis not present

## 2022-12-28 DIAGNOSIS — E876 Hypokalemia: Secondary | ICD-10-CM | POA: Diagnosis not present

## 2022-12-28 DIAGNOSIS — Z992 Dependence on renal dialysis: Secondary | ICD-10-CM | POA: Diagnosis not present

## 2022-12-28 DIAGNOSIS — D689 Coagulation defect, unspecified: Secondary | ICD-10-CM | POA: Diagnosis not present

## 2022-12-28 DIAGNOSIS — D631 Anemia in chronic kidney disease: Secondary | ICD-10-CM | POA: Diagnosis not present

## 2022-12-30 ENCOUNTER — Ambulatory Visit: Payer: Medicare HMO | Admitting: Neurology

## 2022-12-30 ENCOUNTER — Encounter: Payer: Self-pay | Admitting: Neurology

## 2022-12-31 DIAGNOSIS — E876 Hypokalemia: Secondary | ICD-10-CM | POA: Diagnosis not present

## 2022-12-31 DIAGNOSIS — N186 End stage renal disease: Secondary | ICD-10-CM | POA: Diagnosis not present

## 2022-12-31 DIAGNOSIS — E1122 Type 2 diabetes mellitus with diabetic chronic kidney disease: Secondary | ICD-10-CM | POA: Diagnosis not present

## 2022-12-31 DIAGNOSIS — D631 Anemia in chronic kidney disease: Secondary | ICD-10-CM | POA: Diagnosis not present

## 2022-12-31 DIAGNOSIS — N2581 Secondary hyperparathyroidism of renal origin: Secondary | ICD-10-CM | POA: Diagnosis not present

## 2022-12-31 DIAGNOSIS — Z992 Dependence on renal dialysis: Secondary | ICD-10-CM | POA: Diagnosis not present

## 2022-12-31 DIAGNOSIS — D689 Coagulation defect, unspecified: Secondary | ICD-10-CM | POA: Diagnosis not present

## 2023-01-02 DIAGNOSIS — D631 Anemia in chronic kidney disease: Secondary | ICD-10-CM | POA: Diagnosis not present

## 2023-01-02 DIAGNOSIS — D689 Coagulation defect, unspecified: Secondary | ICD-10-CM | POA: Diagnosis not present

## 2023-01-02 DIAGNOSIS — N2581 Secondary hyperparathyroidism of renal origin: Secondary | ICD-10-CM | POA: Diagnosis not present

## 2023-01-02 DIAGNOSIS — E876 Hypokalemia: Secondary | ICD-10-CM | POA: Diagnosis not present

## 2023-01-02 DIAGNOSIS — N186 End stage renal disease: Secondary | ICD-10-CM | POA: Diagnosis not present

## 2023-01-02 DIAGNOSIS — E1122 Type 2 diabetes mellitus with diabetic chronic kidney disease: Secondary | ICD-10-CM | POA: Diagnosis not present

## 2023-01-02 DIAGNOSIS — Z992 Dependence on renal dialysis: Secondary | ICD-10-CM | POA: Diagnosis not present

## 2023-01-04 DIAGNOSIS — E876 Hypokalemia: Secondary | ICD-10-CM | POA: Diagnosis not present

## 2023-01-04 DIAGNOSIS — Z992 Dependence on renal dialysis: Secondary | ICD-10-CM | POA: Diagnosis not present

## 2023-01-04 DIAGNOSIS — D689 Coagulation defect, unspecified: Secondary | ICD-10-CM | POA: Diagnosis not present

## 2023-01-04 DIAGNOSIS — N186 End stage renal disease: Secondary | ICD-10-CM | POA: Diagnosis not present

## 2023-01-04 DIAGNOSIS — E1122 Type 2 diabetes mellitus with diabetic chronic kidney disease: Secondary | ICD-10-CM | POA: Diagnosis not present

## 2023-01-04 DIAGNOSIS — N2581 Secondary hyperparathyroidism of renal origin: Secondary | ICD-10-CM | POA: Diagnosis not present

## 2023-01-04 DIAGNOSIS — D631 Anemia in chronic kidney disease: Secondary | ICD-10-CM | POA: Diagnosis not present

## 2023-01-07 DIAGNOSIS — Z992 Dependence on renal dialysis: Secondary | ICD-10-CM | POA: Diagnosis not present

## 2023-01-07 DIAGNOSIS — E876 Hypokalemia: Secondary | ICD-10-CM | POA: Diagnosis not present

## 2023-01-07 DIAGNOSIS — N2581 Secondary hyperparathyroidism of renal origin: Secondary | ICD-10-CM | POA: Diagnosis not present

## 2023-01-07 DIAGNOSIS — D689 Coagulation defect, unspecified: Secondary | ICD-10-CM | POA: Diagnosis not present

## 2023-01-07 DIAGNOSIS — N186 End stage renal disease: Secondary | ICD-10-CM | POA: Diagnosis not present

## 2023-01-07 DIAGNOSIS — D631 Anemia in chronic kidney disease: Secondary | ICD-10-CM | POA: Diagnosis not present

## 2023-01-07 DIAGNOSIS — E1122 Type 2 diabetes mellitus with diabetic chronic kidney disease: Secondary | ICD-10-CM | POA: Diagnosis not present

## 2023-01-09 DIAGNOSIS — D689 Coagulation defect, unspecified: Secondary | ICD-10-CM | POA: Diagnosis not present

## 2023-01-09 DIAGNOSIS — D631 Anemia in chronic kidney disease: Secondary | ICD-10-CM | POA: Diagnosis not present

## 2023-01-09 DIAGNOSIS — N2581 Secondary hyperparathyroidism of renal origin: Secondary | ICD-10-CM | POA: Diagnosis not present

## 2023-01-09 DIAGNOSIS — E876 Hypokalemia: Secondary | ICD-10-CM | POA: Diagnosis not present

## 2023-01-09 DIAGNOSIS — N186 End stage renal disease: Secondary | ICD-10-CM | POA: Diagnosis not present

## 2023-01-09 DIAGNOSIS — E1122 Type 2 diabetes mellitus with diabetic chronic kidney disease: Secondary | ICD-10-CM | POA: Diagnosis not present

## 2023-01-09 DIAGNOSIS — Z992 Dependence on renal dialysis: Secondary | ICD-10-CM | POA: Diagnosis not present

## 2023-01-11 DIAGNOSIS — N2581 Secondary hyperparathyroidism of renal origin: Secondary | ICD-10-CM | POA: Diagnosis not present

## 2023-01-11 DIAGNOSIS — E876 Hypokalemia: Secondary | ICD-10-CM | POA: Diagnosis not present

## 2023-01-11 DIAGNOSIS — D631 Anemia in chronic kidney disease: Secondary | ICD-10-CM | POA: Diagnosis not present

## 2023-01-11 DIAGNOSIS — N186 End stage renal disease: Secondary | ICD-10-CM | POA: Diagnosis not present

## 2023-01-11 DIAGNOSIS — E1122 Type 2 diabetes mellitus with diabetic chronic kidney disease: Secondary | ICD-10-CM | POA: Diagnosis not present

## 2023-01-11 DIAGNOSIS — D689 Coagulation defect, unspecified: Secondary | ICD-10-CM | POA: Diagnosis not present

## 2023-01-11 DIAGNOSIS — Z992 Dependence on renal dialysis: Secondary | ICD-10-CM | POA: Diagnosis not present

## 2023-01-13 DIAGNOSIS — N186 End stage renal disease: Secondary | ICD-10-CM | POA: Diagnosis not present

## 2023-01-13 DIAGNOSIS — T82858A Stenosis of vascular prosthetic devices, implants and grafts, initial encounter: Secondary | ICD-10-CM | POA: Diagnosis not present

## 2023-01-13 DIAGNOSIS — Z992 Dependence on renal dialysis: Secondary | ICD-10-CM | POA: Diagnosis not present

## 2023-01-13 DIAGNOSIS — I871 Compression of vein: Secondary | ICD-10-CM | POA: Diagnosis not present

## 2023-01-14 DIAGNOSIS — E876 Hypokalemia: Secondary | ICD-10-CM | POA: Diagnosis not present

## 2023-01-14 DIAGNOSIS — D631 Anemia in chronic kidney disease: Secondary | ICD-10-CM | POA: Diagnosis not present

## 2023-01-14 DIAGNOSIS — N2581 Secondary hyperparathyroidism of renal origin: Secondary | ICD-10-CM | POA: Diagnosis not present

## 2023-01-14 DIAGNOSIS — N186 End stage renal disease: Secondary | ICD-10-CM | POA: Diagnosis not present

## 2023-01-14 DIAGNOSIS — Z992 Dependence on renal dialysis: Secondary | ICD-10-CM | POA: Diagnosis not present

## 2023-01-14 DIAGNOSIS — D689 Coagulation defect, unspecified: Secondary | ICD-10-CM | POA: Diagnosis not present

## 2023-01-14 DIAGNOSIS — E1122 Type 2 diabetes mellitus with diabetic chronic kidney disease: Secondary | ICD-10-CM | POA: Diagnosis not present

## 2023-01-15 DIAGNOSIS — I129 Hypertensive chronic kidney disease with stage 1 through stage 4 chronic kidney disease, or unspecified chronic kidney disease: Secondary | ICD-10-CM | POA: Diagnosis not present

## 2023-01-15 DIAGNOSIS — Z992 Dependence on renal dialysis: Secondary | ICD-10-CM | POA: Diagnosis not present

## 2023-01-15 DIAGNOSIS — N186 End stage renal disease: Secondary | ICD-10-CM | POA: Diagnosis not present

## 2023-01-16 ENCOUNTER — Encounter: Payer: Self-pay | Admitting: Family Medicine

## 2023-01-16 ENCOUNTER — Ambulatory Visit (INDEPENDENT_AMBULATORY_CARE_PROVIDER_SITE_OTHER): Payer: Medicare HMO | Admitting: Family Medicine

## 2023-01-16 VITALS — BP 122/74 | HR 60 | Temp 97.9°F | Resp 16 | Ht 67.0 in | Wt 188.2 lb

## 2023-01-16 DIAGNOSIS — M7989 Other specified soft tissue disorders: Secondary | ICD-10-CM | POA: Diagnosis not present

## 2023-01-16 DIAGNOSIS — Z992 Dependence on renal dialysis: Secondary | ICD-10-CM | POA: Diagnosis not present

## 2023-01-16 DIAGNOSIS — L89890 Pressure ulcer of other site, unstageable: Secondary | ICD-10-CM

## 2023-01-16 DIAGNOSIS — E876 Hypokalemia: Secondary | ICD-10-CM | POA: Diagnosis not present

## 2023-01-16 DIAGNOSIS — N2581 Secondary hyperparathyroidism of renal origin: Secondary | ICD-10-CM | POA: Diagnosis not present

## 2023-01-16 DIAGNOSIS — N186 End stage renal disease: Secondary | ICD-10-CM | POA: Diagnosis not present

## 2023-01-16 DIAGNOSIS — D689 Coagulation defect, unspecified: Secondary | ICD-10-CM | POA: Diagnosis not present

## 2023-01-16 NOTE — Progress Notes (Signed)
   Subjective:    Patient ID: GEOGGREY GRESH, male    DOB: 02-27-1941, 82 y.o.   MRN: [id number]  HPI Leg swelling- son reports that thighs are now swelling the way that his lower legs have been.  Both legs.  Pt reports that sometimes legs feel tight.  Son feels that firm area behind the knees are extending recently but has had some degree of swelling for months  Lesion on foot- tip of R 2nd toe is black.  Will occasionally hurt.     Review of Systems For ROS see HPI     Objective:   Physical Exam Vitals reviewed.  Constitutional:      General: He is not in acute distress.    Appearance: Normal appearance. He is not ill-appearing.  HENT:     Head: Normocephalic and atraumatic.  Cardiovascular:     Rate and Rhythm: Normal rate.  Pulmonary:     Effort: Pulmonary effort is normal. No respiratory distress.  Musculoskeletal:        General: Swelling (bilateral swelling of posterior thighs starting at the knee and extending to mid-thigh.  very taught, almost woodlike.  + TTP) present.  Skin:    General: Skin is warm.     Findings: Lesion (unstageable pressure sore at tip of R 2nd toe) present.  Neurological:     Mental Status: He is alert. Mental status is at baseline.           Assessment & Plan:   Swelling of thighs- new.  Pt has tense, almost wood like swelling of posterior thighs bilaterally.  Son reports this has been present for awhile but seems to be 'spreading'.  Area is TTP for pt.  Will need [place] to assess for DVTs given pt's sedentary life.  Will determine next steps based on [place] results  Pressure sore of R toe- new.  Area is dark and unstageable but does not appear to be infected.  No drainage or fluctuance.  Will hold off on abx at this time and refer to Podiatry.  Pt expressed understanding and is in agreement w/ plan.

## 2023-01-16 NOTE — Patient Instructions (Addendum)
Follow up as needed or as scheduled Your ultrasound is tomorrow at 10:00am at [facility] determine the next steps once we have the ultrasound results We'll call you with your podiatry appt Call with any questions or concerns Stay Safe!  Stay Healthy! Hang in there!!

## 2023-01-17 ENCOUNTER — Ambulatory Visit (HOSPITAL_BASED_OUTPATIENT_CLINIC_OR_DEPARTMENT_OTHER)
Admission: RE | Admit: 2023-01-17 | Discharge: 2023-01-17 | Disposition: A | Discharge status: Home / Self Care | Payer: Medicare HMO | Source: Ambulatory Visit | Attending: Family Medicine | Admitting: Family Medicine

## 2023-01-17 ENCOUNTER — Telehealth: Payer: Self-pay

## 2023-01-17 DIAGNOSIS — M7989 Other specified soft tissue disorders: Secondary | ICD-10-CM | POA: Insufficient documentation

## 2023-01-17 DIAGNOSIS — R600 Localized edema: Secondary | ICD-10-CM | POA: Diagnosis not present

## 2023-01-17 NOTE — Telephone Encounter (Signed)
Informed pt and his son Tyler Hahn of the [place] results

## 2023-01-17 NOTE — Telephone Encounter (Signed)
-----   Message from Tyler Hahn sent at 01/17/2023 10:48 AM EDT ----- Thankfully no evidence of clot in either leg

## 2023-01-18 DIAGNOSIS — N2581 Secondary hyperparathyroidism of renal origin: Secondary | ICD-10-CM | POA: Diagnosis not present

## 2023-01-18 DIAGNOSIS — D689 Coagulation defect, unspecified: Secondary | ICD-10-CM | POA: Diagnosis not present

## 2023-01-18 DIAGNOSIS — E876 Hypokalemia: Secondary | ICD-10-CM | POA: Diagnosis not present

## 2023-01-18 DIAGNOSIS — N186 End stage renal disease: Secondary | ICD-10-CM | POA: Diagnosis not present

## 2023-01-18 DIAGNOSIS — Z992 Dependence on renal dialysis: Secondary | ICD-10-CM | POA: Diagnosis not present

## 2023-01-20 ENCOUNTER — Ambulatory Visit: Payer: Medicare HMO | Admitting: Podiatry

## 2023-01-20 ENCOUNTER — Ambulatory Visit (INDEPENDENT_AMBULATORY_CARE_PROVIDER_SITE_OTHER): Payer: Medicare HMO

## 2023-01-20 DIAGNOSIS — L97511 Non-pressure chronic ulcer of other part of right foot limited to breakdown of skin: Secondary | ICD-10-CM | POA: Diagnosis not present

## 2023-01-20 DIAGNOSIS — M869 Osteomyelitis, unspecified: Secondary | ICD-10-CM

## 2023-01-20 DIAGNOSIS — I739 Peripheral vascular disease, unspecified: Secondary | ICD-10-CM

## 2023-01-20 DIAGNOSIS — L97514 Non-pressure chronic ulcer of other part of right foot with necrosis of bone: Secondary | ICD-10-CM

## 2023-01-20 MED ORDER — DOXYCYCLINE HYCLATE 100 MG PO TABS: 100.0000 mg | ORAL_TABLET | Freq: Two times a day (BID) | ORAL | 0 refills | Status: DC

## 2023-01-20 MED ORDER — MUPIROCIN 2 % EX OINT: 1.0000 | TOPICAL_OINTMENT | Freq: Two times a day (BID) | CUTANEOUS | 2 refills | Status: DC

## 2023-01-20 NOTE — Progress Notes (Signed)
Subjective:   Patient ID: Tyler Hahn, male   DOB: 82 y.o.   MRN: [id number]   HPI Chief Complaint  Patient presents with   New Patient (Initial Visit)   Toe Injury   Diabetes    82 year old male presents the office today with concerns of ulceration to the plantar aspect of the right second toe.  Not sure how this started.  No drainage or pus.  Also left big toenail was loose and came off 2 months ago after he was pulled off.  No swelling redness or drainage..   Review of Systems  All other systems reviewed and are negative.  Past Medical History:  Diagnosis Date   Anxiety    Automatic implantable cardioverter-defibrillator in situ    greg taylor   CHF (congestive heart failure) (HCC)    2000   Diabetes mellitus    no meds   Fatty liver    Gout    "bout 2-3 months ago"-meds helped.   Hyperlipidemia    Hypertension    Hyperthyroidism    Kidney cysts    PONV (postoperative nausea and vomiting)    Renal insufficiency     Past Surgical History:  Procedure Laterality Date   AV FISTULA PLACEMENT Right 08/23/2019   Procedure: ARTERIOVENOUS GORTEX GRAFT RIGHT ARM;  Surgeon: Larina Earthly, MD;  Location: [facility] OR;  Service: Vascular;  Laterality: Right;   CARDIAC DEFIBRILLATOR PLACEMENT     catract     COLONOSCOPY WITH PROPOFOL N/A 10/03/2015   Procedure: COLONOSCOPY WITH PROPOFOL;  Surgeon: Meryl Dare, MD;  Location: [facility];  Service: Endoscopy;  Laterality: N/A;   DIAGNOSTIC LAPAROSCOPY  01/27/2014   Dr Luisa Hart   ENTEROSCOPY N/A 11/25/2013   Procedure: ENTEROSCOPY;  Surgeon: Meryl Dare, MD;  Location: [facility];  Service: Endoscopy;  Laterality: N/A;   ICD GENERATOR CHANGEOUT N/A 11/06/2020   Procedure: ICD GENERATOR CHANGEOUT;  Surgeon: Duke Salvia, MD;  Location: [facility] INVASIVE CV LAB;  Service: Cardiovascular;  Laterality: N/A;   ICD,[place] [employer]     LAPAROSCOPY N/A 01/27/2014   Procedure: LAPAROSCOPY DIAGNOSTIC;  Surgeon: Clovis Pu. Cornett,  MD;  Location: [facility] OR;  Service: General;  Laterality: N/A;   polyp removal throat     difficulty speaking    No current facility-administered medications for this visit.  Current Outpatient Medications:    [START ON 01/25/2023] calcitRIOL (ROCALTROL) 0.5 MCG capsule, Take 3 capsules (1.5 mcg total) by mouth every Tuesday, Thursday, and Saturday at 6 PM., Disp: 36 capsule, Rfl: 0   [START ON 01/25/2023] cinacalcet (SENSIPAR) 30 MG tablet, Take 2 tablets (60 mg total) by mouth every Tuesday, Thursday, and Saturday at 6 PM., Disp: 24 tablet, Rfl: 0   midodrine (PROAMATINE) 5 MG tablet, Take 1 tablet (5 mg total) by mouth 3 (three) times daily with meals., Disp: 90 tablet, Rfl: 0  Facility-Administered Medications Ordered in Other Visits:    0.9 %  sodium chloride infusion, 250 mL, Intravenous, PRN, Doutova, Anastassia, MD   acetaminophen (TYLENOL) tablet 650 mg, 650 mg, Oral, Q6H PRN **OR** acetaminophen (TYLENOL) suppository 650 mg, 650 mg, Rectal, Q6H PRN, Doutova, Anastassia, MD   alteplase (CATHFLO ACTIVASE) injection 2 mg, 2 mg, Intracatheter, Once PRN, Terrial Rhodes, MD   anticoagulant sodium citrate solution 5 mL, 5 mL, Intracatheter, PRN, Terrial Rhodes, MD   atorvastatin (LIPITOR) tablet 20 mg, 20 mg, Oral, Daily, Doutova, Anastassia, MD, 20 mg at 01/24/23 1151   [START ON 01/25/2023] calcitRIOL (ROCALTROL)  capsule 1.5 mcg, 1.5 mcg, Oral, Q T,Th,Sat-1800, Delano Metz, MD   Chlorhexidine Gluconate Cloth 2 % PADS 6 each, 6 each, Topical, Q0600, Therisa Doyne, MD, 6 each at 01/24/23 0950   [START ON 01/25/2023] cinacalcet (SENSIPAR) tablet 60 mg, 60 mg, Oral, Q T,Th,Sat-1800, Delano Metz, MD   donepezil (ARICEPT) tablet 10 mg, 10 mg, Oral, QHS, Doutova, Anastassia, MD, 10 mg at 01/23/23 2100   glucagon (human recombinant) (GLUCAGEN) injection 1 mg, 1 mg, Intravenous, Once PRN, Olalere, Adewale A, MD   heparin injection 1,000 Units, 1,000 Units, Intracatheter, PRN,  Terrial Rhodes, MD   insulin aspart (novoLOG) injection 0-6 Units, 0-6 Units, Subcutaneous, TID WC, Howerter, Justin B, DO   levothyroxine (SYNTHROID) tablet 50 mcg, 50 mcg, Oral, Daily, Doutova, Anastassia, MD, 50 mcg at 01/24/23 0600   lidocaine (PF) (XYLOCAINE) 1 % injection 5 mL, 5 mL, Intradermal, PRN, Terrial Rhodes, MD   lidocaine-prilocaine (EMLA) cream 1 Application, 1 Application, Topical, PRN, Terrial Rhodes, MD   loratadine (CLARITIN) tablet 10 mg, 10 mg, Oral, Daily, Doutova, Anastassia, MD, 10 mg at 01/24/23 1153   midodrine (PROAMATINE) tablet 5 mg, 5 mg, Oral, TID WC, Doutova, Anastassia, MD, 5 mg at 01/24/23 1151   mupirocin ointment (BACTROBAN) 2 % 1 Application, 1 Application, Topical, BID, Doutova, Anastassia, MD, 1 Application at 01/24/23 1150   ondansetron (ZOFRAN-ODT) disintegrating tablet 4 mg, 4 mg, Oral, Q8H PRN, Arrien, York Ram, MD, 4 mg at 01/23/23 1704   pantoprazole (PROTONIX) EC tablet 40 mg, 40 mg, Oral, Daily, Doutova, Anastassia, MD, 40 mg at 01/24/23 1152   pentafluoroprop-tetrafluoroeth (GEBAUERS) aerosol 1 Application, 1 Application, Topical, PRN, Terrial Rhodes, MD   sertraline (ZOLOFT) tablet 100 mg, 100 mg, Oral, Daily, Doutova, Anastassia, MD, 100 mg at 01/24/23 1151   sevelamer carbonate (RENVELA) tablet 800 mg, 800 mg, Oral, TID WC, Pham, Minh Q, RPH-CPP, 800 mg at 01/24/23 1151   sodium chloride flush (NS) 0.9 % injection 3 mL, 3 mL, Intravenous, Q12H, Doutova, Anastassia, MD, 3 mL at 01/24/23 1153   sodium chloride flush (NS) 0.9 % injection 3 mL, 3 mL, Intravenous, PRN, Doutova, Anastassia, MD  Allergies  Allergen Reactions   Sulfa Antibiotics Itching and Rash   Sulfonamide Derivatives Itching and Rash          Objective:  Physical Exam  General: AAO x3, NAD  Dermatological: Ulceration present distal aspect the right second toe there is localized edema present to right second toe.  There is no probing to bone today.   There is no drainage or pus.  No fluctuation or crepitation and no malodor.  Left hallux toenail is not present.         Vascular: Unable to palpate pulse.  Neruologic: Decreased.  Musculoskeletal: Hammertoes present.     Assessment:   Ulceration, osteomyelitis right second toe; left hallux toenail avulsion     Plan:  -Treatment options discussed including all alternatives, risks, and complications -Etiology of symptoms were discussed -X-rays were obtained and reviewed with the patient.  3 views right and left foot were obtained.  Cortical changes the distal phalanx of the right second toe consistent with likely osteomyelitis. -Doxycyline -Mupirocin dressing changes daily -Vascular referral placed -ABI -Offloading -Unfortunately likely will need toe amputation. -Monitor for any clinical signs or symptoms of infection and directed to call the office immediately should any occur or go to the ER.  Vivi Barrack DPM

## 2023-01-21 ENCOUNTER — Emergency Department (HOSPITAL_COMMUNITY): Payer: Medicare HMO

## 2023-01-21 ENCOUNTER — Encounter (HOSPITAL_COMMUNITY): Payer: Self-pay

## 2023-01-21 ENCOUNTER — Other Ambulatory Visit: Payer: Self-pay

## 2023-01-21 ENCOUNTER — Inpatient Hospital Stay (HOSPITAL_COMMUNITY)
Admission: EM | Admit: 2023-01-21 | Discharge: 2023-01-25 | DRG: 291 | Disposition: A | Discharge status: Home Health | Payer: Medicare HMO | Attending: Internal Medicine | Admitting: Internal Medicine

## 2023-01-21 ENCOUNTER — Inpatient Hospital Stay (HOSPITAL_COMMUNITY): Payer: Medicare HMO

## 2023-01-21 DIAGNOSIS — E162 Hypoglycemia, unspecified: Secondary | ICD-10-CM | POA: Diagnosis not present

## 2023-01-21 DIAGNOSIS — I5023 Acute on chronic systolic (congestive) heart failure: Secondary | ICD-10-CM

## 2023-01-21 DIAGNOSIS — K5730 Diverticulosis of large intestine without perforation or abscess without bleeding: Secondary | ICD-10-CM | POA: Diagnosis not present

## 2023-01-21 DIAGNOSIS — K760 Fatty (change of) liver, not elsewhere classified: Secondary | ICD-10-CM | POA: Diagnosis present

## 2023-01-21 DIAGNOSIS — N281 Cyst of kidney, acquired: Secondary | ICD-10-CM | POA: Diagnosis not present

## 2023-01-21 DIAGNOSIS — R54 Age-related physical debility: Secondary | ICD-10-CM | POA: Diagnosis present

## 2023-01-21 DIAGNOSIS — I771 Stricture of artery: Secondary | ICD-10-CM | POA: Diagnosis not present

## 2023-01-21 DIAGNOSIS — E872 Acidosis, unspecified: Secondary | ICD-10-CM | POA: Diagnosis present

## 2023-01-21 DIAGNOSIS — I10 Essential (primary) hypertension: Secondary | ICD-10-CM | POA: Diagnosis not present

## 2023-01-21 DIAGNOSIS — E1122 Type 2 diabetes mellitus with diabetic chronic kidney disease: Secondary | ICD-10-CM | POA: Diagnosis not present

## 2023-01-21 DIAGNOSIS — E039 Hypothyroidism, unspecified: Secondary | ICD-10-CM | POA: Diagnosis present

## 2023-01-21 DIAGNOSIS — R0602 Shortness of breath: Secondary | ICD-10-CM | POA: Diagnosis not present

## 2023-01-21 DIAGNOSIS — I2729 Other secondary pulmonary hypertension: Secondary | ICD-10-CM | POA: Diagnosis present

## 2023-01-21 DIAGNOSIS — E0590 Thyrotoxicosis, unspecified without thyrotoxic crisis or storm: Secondary | ICD-10-CM | POA: Diagnosis present

## 2023-01-21 DIAGNOSIS — I2510 Atherosclerotic heart disease of native coronary artery without angina pectoris: Secondary | ICD-10-CM | POA: Diagnosis not present

## 2023-01-21 DIAGNOSIS — Z66 Do not resuscitate: Secondary | ICD-10-CM | POA: Diagnosis present

## 2023-01-21 DIAGNOSIS — N2581 Secondary hyperparathyroidism of renal origin: Secondary | ICD-10-CM | POA: Diagnosis present

## 2023-01-21 DIAGNOSIS — K219 Gastro-esophageal reflux disease without esophagitis: Secondary | ICD-10-CM | POA: Diagnosis present

## 2023-01-21 DIAGNOSIS — R932 Abnormal findings on diagnostic imaging of liver and biliary tract: Secondary | ICD-10-CM | POA: Diagnosis not present

## 2023-01-21 DIAGNOSIS — I428 Other cardiomyopathies: Secondary | ICD-10-CM | POA: Diagnosis present

## 2023-01-21 DIAGNOSIS — E1169 Type 2 diabetes mellitus with other specified complication: Secondary | ICD-10-CM | POA: Diagnosis not present

## 2023-01-21 DIAGNOSIS — I959 Hypotension, unspecified: Secondary | ICD-10-CM | POA: Insufficient documentation

## 2023-01-21 DIAGNOSIS — M7989 Other specified soft tissue disorders: Secondary | ICD-10-CM | POA: Diagnosis not present

## 2023-01-21 DIAGNOSIS — Z833 Family history of diabetes mellitus: Secondary | ICD-10-CM

## 2023-01-21 DIAGNOSIS — I120 Hypertensive chronic kidney disease with stage 5 chronic kidney disease or end stage renal disease: Secondary | ICD-10-CM | POA: Diagnosis not present

## 2023-01-21 DIAGNOSIS — I132 Hypertensive heart and chronic kidney disease with heart failure and with stage 5 chronic kidney disease, or end stage renal disease: Principal | ICD-10-CM | POA: Diagnosis present

## 2023-01-21 DIAGNOSIS — Z7189 Other specified counseling: Secondary | ICD-10-CM | POA: Diagnosis not present

## 2023-01-21 DIAGNOSIS — D696 Thrombocytopenia, unspecified: Secondary | ICD-10-CM | POA: Diagnosis not present

## 2023-01-21 DIAGNOSIS — Z79899 Other long term (current) drug therapy: Secondary | ICD-10-CM

## 2023-01-21 DIAGNOSIS — E1165 Type 2 diabetes mellitus with hyperglycemia: Secondary | ICD-10-CM | POA: Diagnosis not present

## 2023-01-21 DIAGNOSIS — Z79890 Hormone replacement therapy (postmenopausal): Secondary | ICD-10-CM

## 2023-01-21 DIAGNOSIS — E8770 Fluid overload, unspecified: Secondary | ICD-10-CM | POA: Diagnosis not present

## 2023-01-21 DIAGNOSIS — J90 Pleural effusion, not elsewhere classified: Secondary | ICD-10-CM | POA: Diagnosis not present

## 2023-01-21 DIAGNOSIS — L97519 Non-pressure chronic ulcer of other part of right foot with unspecified severity: Secondary | ICD-10-CM | POA: Diagnosis not present

## 2023-01-21 DIAGNOSIS — N4889 Other specified disorders of penis: Secondary | ICD-10-CM | POA: Diagnosis present

## 2023-01-21 DIAGNOSIS — Z95810 Presence of automatic (implantable) cardiac defibrillator: Secondary | ICD-10-CM

## 2023-01-21 DIAGNOSIS — E785 Hyperlipidemia, unspecified: Secondary | ICD-10-CM | POA: Diagnosis present

## 2023-01-21 DIAGNOSIS — E8889 Other specified metabolic disorders: Secondary | ICD-10-CM | POA: Diagnosis not present

## 2023-01-21 DIAGNOSIS — K5900 Constipation, unspecified: Secondary | ICD-10-CM | POA: Diagnosis not present

## 2023-01-21 DIAGNOSIS — Z992 Dependence on renal dialysis: Secondary | ICD-10-CM | POA: Diagnosis not present

## 2023-01-21 DIAGNOSIS — R911 Solitary pulmonary nodule: Secondary | ICD-10-CM | POA: Diagnosis present

## 2023-01-21 DIAGNOSIS — I5082 Biventricular heart failure: Secondary | ICD-10-CM | POA: Diagnosis not present

## 2023-01-21 DIAGNOSIS — D631 Anemia in chronic kidney disease: Secondary | ICD-10-CM | POA: Diagnosis present

## 2023-01-21 DIAGNOSIS — F419 Anxiety disorder, unspecified: Secondary | ICD-10-CM | POA: Diagnosis present

## 2023-01-21 DIAGNOSIS — F0390 Unspecified dementia without behavioral disturbance: Secondary | ICD-10-CM | POA: Diagnosis not present

## 2023-01-21 DIAGNOSIS — E11621 Type 2 diabetes mellitus with foot ulcer: Secondary | ICD-10-CM | POA: Diagnosis present

## 2023-01-21 DIAGNOSIS — I5022 Chronic systolic (congestive) heart failure: Secondary | ICD-10-CM | POA: Diagnosis present

## 2023-01-21 DIAGNOSIS — E876 Hypokalemia: Secondary | ICD-10-CM | POA: Diagnosis present

## 2023-01-21 DIAGNOSIS — Z87891 Personal history of nicotine dependence: Secondary | ICD-10-CM

## 2023-01-21 DIAGNOSIS — E8779 Other fluid overload: Secondary | ICD-10-CM

## 2023-01-21 DIAGNOSIS — Z841 Family history of disorders of kidney and ureter: Secondary | ICD-10-CM

## 2023-01-21 DIAGNOSIS — N186 End stage renal disease: Secondary | ICD-10-CM | POA: Diagnosis not present

## 2023-01-21 DIAGNOSIS — I440 Atrioventricular block, first degree: Secondary | ICD-10-CM | POA: Diagnosis present

## 2023-01-21 DIAGNOSIS — E11649 Type 2 diabetes mellitus with hypoglycemia without coma: Secondary | ICD-10-CM | POA: Diagnosis not present

## 2023-01-21 DIAGNOSIS — Z882 Allergy status to sulfonamides status: Secondary | ICD-10-CM

## 2023-01-21 DIAGNOSIS — I5031 Acute diastolic (congestive) heart failure: Secondary | ICD-10-CM | POA: Diagnosis not present

## 2023-01-21 DIAGNOSIS — I9589 Other hypotension: Secondary | ICD-10-CM | POA: Insufficient documentation

## 2023-01-21 DIAGNOSIS — R0989 Other specified symptoms and signs involving the circulatory and respiratory systems: Secondary | ICD-10-CM | POA: Diagnosis present

## 2023-01-21 DIAGNOSIS — I517 Cardiomegaly: Secondary | ICD-10-CM | POA: Diagnosis not present

## 2023-01-21 DIAGNOSIS — R918 Other nonspecific abnormal finding of lung field: Secondary | ICD-10-CM | POA: Diagnosis not present

## 2023-01-21 DIAGNOSIS — Z8249 Family history of ischemic heart disease and other diseases of the circulatory system: Secondary | ICD-10-CM

## 2023-01-21 DIAGNOSIS — N250 Renal osteodystrophy: Secondary | ICD-10-CM | POA: Diagnosis not present

## 2023-01-21 DIAGNOSIS — Z515 Encounter for palliative care: Secondary | ICD-10-CM | POA: Diagnosis not present

## 2023-01-21 LAB — CBC
HCT: 37.5 % — ABNORMAL LOW (ref 39.0–52.0)
Hemoglobin: 12.2 g/dL — ABNORMAL LOW (ref 13.0–17.0)
MCH: 30.7 pg (ref 26.0–34.0)
MCHC: 32.5 g/dL (ref 30.0–36.0)
MCV: 94.2 fL (ref 80.0–100.0)
Platelets: 59 10*3/uL — ABNORMAL LOW (ref 150–400)
RBC: 3.98 MIL/uL — ABNORMAL LOW (ref 4.22–5.81)
RDW: 20.2 % — ABNORMAL HIGH (ref 11.5–15.5)
WBC: 7.3 10*3/uL (ref 4.0–10.5)
nRBC: 2.3 % — ABNORMAL HIGH (ref 0.0–0.2)

## 2023-01-21 LAB — DIFFERENTIAL
Abs Immature Granulocytes: 0.08 K/uL — ABNORMAL HIGH (ref 0.00–0.07)
Basophils Absolute: 0.1 K/uL (ref 0.0–0.1)
Basophils Relative: 1 %
Eosinophils Absolute: 0.1 K/uL (ref 0.0–0.5)
Eosinophils Relative: 1 %
Immature Granulocytes: 1 %
Lymphocytes Relative: 9 %
Lymphs Abs: 0.7 K/uL (ref 0.7–4.0)
Monocytes Absolute: 0.7 K/uL (ref 0.1–1.0)
Monocytes Relative: 9 %
Neutro Abs: 5.8 K/uL (ref 1.7–7.7)
Neutrophils Relative %: 79 %

## 2023-01-21 LAB — COMPREHENSIVE METABOLIC PANEL
ALT: 22 U/L (ref 0–44)
AST: 29 U/L (ref 15–41)
Albumin: 3.3 g/dL — ABNORMAL LOW (ref 3.5–5.0)
Alkaline Phosphatase: 86 U/L (ref 38–126)
Anion gap: 20 — ABNORMAL HIGH (ref 5–15)
BUN: 72 mg/dL — ABNORMAL HIGH (ref 8–23)
CO2: 21 mmol/L — ABNORMAL LOW (ref 22–32)
Calcium: 8.9 mg/dL (ref 8.9–10.3)
Chloride: 95 mmol/L — ABNORMAL LOW (ref 98–111)
Creatinine, Ser: 8.14 mg/dL — ABNORMAL HIGH (ref 0.61–1.24)
GFR, Estimated: 6 mL/min — ABNORMAL LOW (ref >60)
Glucose, Bld: 115 mg/dL — ABNORMAL HIGH (ref 70–99)
Potassium: 4.8 mmol/L (ref 3.5–5.1)
Sodium: 136 mmol/L (ref 135–145)
Total Bilirubin: 0.8 mg/dL (ref 0.3–1.2)
Total Protein: 8.2 g/dL — ABNORMAL HIGH (ref 6.5–8.1)

## 2023-01-21 LAB — BLOOD GAS, VENOUS
Acid-base deficit: 3.6 mmol/L — ABNORMAL HIGH (ref 0.0–2.0)
Bicarbonate: 23.5 mmol/L (ref 20.0–28.0)
O2 Saturation: 33.1 %
Patient temperature: 37
pCO2, Ven: 50 mmHg (ref 44–60)
pH, Ven: 7.28 (ref 7.25–7.43)
pO2, Ven: <31 mmHg — CL (ref 32–45)

## 2023-01-21 LAB — FERRITIN: Ferritin: 984 ng/mL — ABNORMAL HIGH (ref 24–336)

## 2023-01-21 LAB — PHOSPHORUS: Phosphorus: 6.9 mg/dL — ABNORMAL HIGH (ref 2.5–4.6)

## 2023-01-21 LAB — GLUCOSE, CAPILLARY
Glucose-Capillary: 20 mg/dL — CL (ref 70–99)
Glucose-Capillary: <10 mg/dL — CL (ref 70–99)

## 2023-01-21 LAB — RETICULOCYTES
Immature Retic Fract: 43.3 % — ABNORMAL HIGH (ref 2.3–15.9)
RBC.: 4.02 MIL/uL — ABNORMAL LOW (ref 4.22–5.81)
Retic Count, Absolute: 125.8 K/uL (ref 19.0–186.0)
Retic Ct Pct: 3.1 % (ref 0.4–3.1)

## 2023-01-21 LAB — IRON AND TIBC
Iron: 217 ug/dL — ABNORMAL HIGH (ref 45–182)
Saturation Ratios: 78 % — ABNORMAL HIGH (ref 17.9–39.5)
TIBC: 280 ug/dL (ref 250–450)
UIBC: 63 ug/dL

## 2023-01-21 LAB — CBG MONITORING, ED: Glucose-Capillary: 96 mg/dL (ref 70–99)

## 2023-01-21 LAB — TSH: TSH: 9.642 u[IU]/mL — ABNORMAL HIGH (ref 0.350–4.500)

## 2023-01-21 LAB — I-STAT CG4 LACTIC ACID, ED: Lactic Acid, Venous: 6 mmol/L (ref 0.5–1.9)

## 2023-01-21 LAB — PROTIME-INR
INR: 1.6 — ABNORMAL HIGH (ref 0.8–1.2)
Prothrombin Time: 19.6 s — ABNORMAL HIGH (ref 11.4–15.2)

## 2023-01-21 MED ORDER — HYDROCORTISONE SOD SUC (PF) 100 MG IJ SOLR
100.0000 mg | Freq: Three times a day (TID) | INTRAMUSCULAR | Status: DC
  Administered 2023-01-22 (×2): 100 mg via INTRAVENOUS
  Filled 2023-01-21 (×3): qty 2

## 2023-01-21 MED ORDER — IOHEXOL 350 MG/ML SOLN
100.0000 mL | Freq: Once | INTRAVENOUS | Status: AC | PRN
  Administered 2023-01-21: 100 mL via INTRAVENOUS

## 2023-01-21 MED ORDER — SODIUM CHLORIDE 0.9 % IV BOLUS
250.0000 mL | Freq: Once | INTRAVENOUS | Status: AC
  Administered 2023-01-21: 250 mL via INTRAVENOUS

## 2023-01-21 MED ORDER — DEXTROSE 50 % IV SOLN: 25.0000 g | INTRAVENOUS | Status: AC

## 2023-01-21 MED ORDER — IOHEXOL 350 MG/ML SOLN
75.0000 mL | Freq: Once | INTRAVENOUS | Status: AC | PRN
  Administered 2023-01-22: 75 mL via INTRAVENOUS

## 2023-01-21 MED ORDER — CHLORHEXIDINE GLUCONATE CLOTH 2 % EX PADS
6.0000 | MEDICATED_PAD | Freq: Every day | CUTANEOUS | Status: DC
  Administered 2023-01-23 – 2023-01-25 (×3): 6 via TOPICAL

## 2023-01-21 MED ORDER — MIDODRINE HCL 5 MG PO TABS: 5.0000 mg | ORAL_TABLET | Freq: Three times a day (TID) | ORAL | Status: DC

## 2023-01-21 MED ORDER — DEXTROSE 50 % IV SOLN
INTRAVENOUS | Status: AC
  Administered 2023-01-21: 50 mL
  Filled 2023-01-21: qty 50

## 2023-01-21 MED ORDER — MIDODRINE HCL 5 MG PO TABS
5.0000 mg | ORAL_TABLET | Freq: Three times a day (TID) | ORAL | Status: DC
  Administered 2023-01-22 – 2023-01-25 (×13): 5 mg via ORAL
  Filled 2023-01-21 (×13): qty 1

## 2023-01-21 MED ORDER — INSULIN ASPART 100 UNIT/ML IJ SOLN
0.0000 [IU] | INTRAMUSCULAR | Status: DC
  Filled 2023-01-21: qty 0.06

## 2023-01-21 NOTE — Assessment & Plan Note (Signed)
Allow permissive hypertension 

## 2023-01-21 NOTE — Assessment & Plan Note (Addendum)
Patient has had some soft blood pressures for few days at least and has made his hemodialysis somewhat more difficult.  And has been made more sleepy Noted to have lactic acid at 6 Cool extremities Patient was given IV bolus 250 Ordered midodrine 5 mg p.o. 3 times daily now Blood pressure is improved after 110 down from 80s Patient became more arousable. Continue to cycle lactic acid Discussed case with nephrology According to family at this point they would like to attempt to stay the course Patient is DNR/DNI and would like to avoid over aggressive interventions but not quite ready for comfort care at this point admit to progressive ecchymosis, no blood pressure remains stable will be able to proceed with hemodialysis if continues to drop we will need to consider for CVVHD and ICU transfer Case discussed with PCCM who is aware we will need to reconsult if ICU transfer is needed Ordered palliative care consult Overall poor prognosis Given hypotension and evidence of right heart failure Obtain CTA to rule out PE

## 2023-01-21 NOTE — Assessment & Plan Note (Addendum)
Appreciate nephrology consult will need dialysis tomorrow given soft blood pressures will observe and progressive Patient has no new oxygen requirement

## 2023-01-21 NOTE — ED Notes (Signed)
ED TO INPATIENT HANDOFF REPORT  Name/Age/Gender Tyler Hahn 82 y.o. male  Code Status    Code Status Orders  (From admission, onward)           Start     Ordered   01/21/23 1959  Do not attempt resuscitation (DNR)  Continuous       Question Answer Comment  If patient has no pulse and is not breathing Do Not Attempt Resuscitation   If patient has a pulse and/or is breathing: Medical Treatment Goals LIMITED ADDITIONAL INTERVENTIONS: Use medication/IV fluids and cardiac monitoring as indicated; Do not use intubation or mechanical ventilation (DNI), also provide comfort medications.  Transfer to Progressive/Stepdown as indicated, avoid Intensive Care.   Consent: Discussion documented in EHR or advanced directives reviewed      01/21/23 1959           Code Status History     Date Active Date Inactive Code Status Order ID Comments User Context   11/06/2020 1104 11/06/2020 1710 Full Code [id number]  Duke Salvia, MD Inpatient   08/30/2019 2131 09/05/2019 0029 Full Code [id number]  Hannah Beat, MD ED   08/17/2019 1957 08/24/2019 2020 Full Code [id number]  Angie Fava, DO ED   01/27/2014 1020 01/28/2014 1701 Full Code [id number]  Harriette Bouillon, MD Inpatient       Home/SNF/Other Home  Chief Complaint Fluid overload [E87.70]  Level of Care/Admitting Diagnosis ED Disposition     ED Disposition  Admit   Condition  --   Comment  Hospital Area: [facility] [[id number]]  Level of Care: Progressive [102]  Admit to Progressive based on following criteria: CARDIOVASCULAR & THORACIC of moderate stability with acute coronary syndrome symptoms/low risk myocardial infarction/hypertensive urgency/arrhythmias/heart failure potentially compromising stability and stable post cardiovascular intervention patients.  May admit patient to [facility] or [facility] if equivalent level of care is available:: No  Covid Evaluation: Asymptomatic - no recent exposure (last 10  days) testing not required  Diagnosis: Fluid overload [235573]  Admitting Physician: Therisa Doyne [[id number]]  Attending Physician: Therisa Doyne [[id number]]  Certification:: I certify this patient will need inpatient services for at least 2 midnights  Estimated Length of Stay: 2          Medical History Past Medical History:  Diagnosis Date   Anxiety    Automatic implantable cardioverter-defibrillator in situ    greg taylor   CHF (congestive heart failure) (HCC)    2000   Diabetes mellitus    no meds   Fatty liver    Gout    "bout 2-3 months ago"-meds helped.   Hyperlipidemia    Hypertension    Hyperthyroidism    Kidney cysts    PONV (postoperative nausea and vomiting)    Renal insufficiency     Allergies Allergies  Allergen Reactions   Sulfa Antibiotics Itching and Rash   Sulfonamide Derivatives Itching and Rash    IV Location/Drains/Wounds Patient Lines/Drains/Airways Status     Active Line/Drains/Airways     Name Placement date Placement time Site Days   Peripheral IV 01/21/23 20 G Left Antecubital 01/21/23  1449  Antecubital  less than 1   Fistula / Graft Right Upper arm Arteriovenous vein graft 08/23/19  1015  Upper arm  1247            Labs/Imaging Results for orders placed or performed during the hospital encounter of 01/21/23 (from the past 48 hour(s))  CBC  Status: Abnormal   Collection Time: 01/21/23  3:00 PM  Result Value Ref Range   WBC 7.3 4.0 - 10.5 K/uL   RBC 3.98 (L) 4.22 - 5.81 MIL/uL   Hemoglobin 12.2 (L) 13.0 - 17.0 g/dL   HCT 78.2 (L) 95.6 - 21.3 %   MCV 94.2 80.0 - 100.0 fL   MCH 30.7 26.0 - 34.0 pg   MCHC 32.5 30.0 - 36.0 g/dL   RDW 08.6 (H) 57.8 - 46.9 %   Platelets 59 (L) 150 - 400 K/uL    Comment: SPECIMEN CHECKED FOR CLOTS Immature Platelet Fraction may be clinically indicated, consider ordering this additional test [id number] REPEATED TO VERIFY PLATELET COUNT CONFIRMED BY SMEAR    nRBC 2.3 (H) 0.0 - 0.2 %     Comment: Performed at [facility], [address] [place], [place] [address]  Differential     Status: Abnormal   Collection Time: 01/21/23  3:00 PM  Result Value Ref Range   Neutrophils Relative % 79 %   Neutro Abs 5.8 1.7 - 7.7 K/uL   Lymphocytes Relative 9 %   Lymphs Abs 0.7 0.7 - 4.0 K/uL   Monocytes Relative 9 %   Monocytes Absolute 0.7 0.1 - 1.0 K/uL   Eosinophils Relative 1 %   Eosinophils Absolute 0.1 0.0 - 0.5 K/uL   Basophils Relative 1 %   Basophils Absolute 0.1 0.0 - 0.1 K/uL   Immature Granulocytes 1 %   Abs Immature Granulocytes 0.08 (H) 0.00 - 0.07 K/uL    Comment: Performed at [facility], [address] [place], [place] [address]  Comprehensive metabolic panel     Status: Abnormal   Collection Time: 01/21/23  4:10 PM  Result Value Ref Range   Sodium 136 135 - 145 mmol/L   Potassium 4.8 3.5 - 5.1 mmol/L   Chloride 95 (L) 98 - 111 mmol/L   CO2 21 (L) 22 - 32 mmol/L   Glucose, Bld 115 (H) 70 - 99 mg/dL    Comment: Glucose reference range applies only to samples taken after fasting for at least 8 hours.   BUN 72 (H) 8 - 23 mg/dL   Creatinine, Ser 5.36 (H) 0.61 - 1.24 mg/dL   Calcium 8.9 8.9 - 64.4 mg/dL   Total Protein 8.2 (H) 6.5 - 8.1 g/dL   Albumin 3.3 (L) 3.5 - 5.0 g/dL   AST 29 15 - 41 U/L   ALT 22 0 - 44 U/L   Alkaline Phosphatase 86 38 - 126 U/L   Total Bilirubin 0.8 0.3 - 1.2 mg/dL   GFR, Estimated 6 (L) >60 mL/min    Comment: (NOTE) Calculated using the CKD-EPI Creatinine Equation (2021)    Anion gap 20 (H) 5 - 15    Comment: Performed at [facility], [address], [place], [place] [address]  Reticulocytes     Status: Abnormal   Collection Time: 01/21/23  8:06 PM  Result Value Ref Range   Retic Ct Pct 3.1 0.4 - 3.1 %   RBC. 4.02 (L) 4.22 - 5.81 MIL/uL   Retic Count, Absolute 125.8 19.0 - 186.0 K/uL   Immature Retic Fract 43.3 (H) 2.3 - 15.9 %    Comment: Performed at [facility], [address], [place], [place] [address]   CT VENOGRAM ABD/PELVIS/LOWER EXT BILAT  Result Date: 01/21/2023 CLINICAL DATA:  Worsening edema and legs. EXAM: CT VENOGRAM ABDOMEN AND PELVIS AND LOWER EXTREMITY BILATERAL  TECHNIQUE: Venographic phase images of the abdomen, pelvis and lower extremities were obtained following the administration of intravenous contrast. Multiplanar reformats and maximum intensity projections were generated. RADIATION DOSE REDUCTION: This exam was performed according to the departmental dose-optimization program which includes automated exposure control, adjustment of the mA and/or kV according to patient size and/or use of iterative reconstruction technique. CONTRAST:  OMNIPAQUE IOHEXOL 350 MG/ML SOLN COMPARISON:  None Available. FINDINGS: Lower chest: Cardiomegaly. Pacer wires partially visualized in the right heart. Aortic atherosclerosis. Trace bilateral pleural effusions. Hepatobiliary: Reflux of contrast into the IVC and hepatic veins compatible with right heart dysfunction. No focal hepatic abnormality. Gallbladder unremarkable. Pancreas: No focal abnormality or ductal dilatation. Spleen: No focal abnormality.  Normal size. Adrenals/Urinary Tract: Adrenal glands normal. Kidneys are atrophic. Numerous bilateral renal cysts which appear benign. No follow-up imaging recommended. No visible stones or hydronephrosis. Urinary bladder decompressed, grossly unremarkable. Stomach/Bowel: Normal appendix. Sigmoid diverticulosis. No active diverticulitis. Stomach and small bowel decompressed, unremarkable. Vascular/Lymphatic: Diffuse aortoiliac atherosclerosis. No aneurysm or dissection. No adenopathy. No visible IVC or iliac venous clot. Reproductive: No visible focal abnormality. Other: No free fluid or free air. Musculoskeletal: No acute bony abnormality. IVC: No evidence for thrombus or stenosis. Portal and mesenteric veins: No evidence for thrombus or  stenosis. Bilateral iliac veins: No evidence for thrombus or stenosis. Right lower extremity: No evidence for thrombus or stenosis to the level of the knee. Left lower extremity: No evidence for thrombus or stenosis to the level of the knee. IMPRESSION: No evidence of venous thrombus in the abdomen, pelvis or lower extremities to the level of the knee. Cardiomegaly. Reflux of contrast into the hepatic veins and IVC suggest right heart dysfunction. Aortoiliac atherosclerosis. Sigmoid diverticulosis. Trace bilateral pleural effusions. Electronically Signed   By: Charlett Nose M.D.   On: 01/21/2023 17:33   DG Chest 2 View  Result Date: 01/21/2023 CLINICAL DATA:  Shortness of breath EXAM: CHEST - 2 VIEW COMPARISON:  X-ray 05/31/2022 FINDINGS: Enlarged cardiopericardial silhouette. Central vascular congestion. Calcified tortuous aorta. Left upper chest defibrillator. Underinflation. No consolidation, pneumothorax or edema. Overlapping cardiac leads. Lateral view is limited by under penetration. IMPRESSION: Enlarged heart with vascular congestion.  Defibrillator. Underinflation. Limited lateral view Electronically Signed   By: Karen Kays M.D.   On: 01/21/2023 16:15    Pending Labs Unresulted Labs (From admission, onward)     Start     Ordered   01/22/23 0500  Prealbumin  Tomorrow morning,   R        01/21/23 1944   01/22/23 0500  Vitamin B12  (Anemia Panel (PNL))  Tomorrow morning,   R        01/21/23 1944   01/22/23 0500  Folate  (Anemia Panel (PNL))  Tomorrow morning,   R        01/21/23 1944   01/21/23 2030  TSH  Once,   AD        01/21/23 2030   01/21/23 2030  Iron and TIBC  Once,   AD        01/21/23 2030   01/21/23 2030  Ferritin  Once,   AD        01/21/23 2030   01/21/23 2030  T4, free  Once,   AD        01/21/23 2030   01/21/23 2030  Protime-INR  Once,   R        01/21/23 2030   01/21/23 2030  T3  Once,  AD        01/21/23 2030   01/21/23 1945  Phosphorus  Add-on,   AD         01/21/23 1944   01/21/23 1945  CBC with Differential/Platelet  Add-on,   AD       Question:  Release to patient  Answer:  Immediate   01/21/23 1944   01/21/23 1943  Hemoglobin A1c  Add-on,   AD       Comments: To assess prior glycemic control    01/21/23 1943   01/21/23 1858  Hepatitis B surface antigen  (New Admission Hemo Labs (Hepatitis B))  Once,   URGENT        01/21/23 1859   01/21/23 1858  Hepatitis B surface antibody,quantitative  (New Admission Hemo Labs (Hepatitis B))  Once,   URGENT        01/21/23 1859   Signed and Held  Renal function panel  Once,   R        Signed and Held   Signed and Held  CBC  Once,   R        Signed and Held   Signed and Held  Magnesium  Tomorrow morning,   R        Signed and Held   Signed and Held  Phosphorus  Tomorrow morning,   R        Signed and Held   Signed and Held  Comprehensive metabolic panel  Tomorrow morning,   R       Question:  Release to patient  Answer:  Immediate   Signed and Held   Signed and Held  CBC  Tomorrow morning,   R       Question:  Release to patient  Answer:  Immediate   Signed and Held            Vitals/Pain Today's Vitals   01/21/23 1920 01/21/23 1930 01/21/23 1945 01/21/23 2000  BP: 103/70 104/66 99/62 107/67  Pulse: 77 77    Resp: (!) 22 (!) 26 17 17   Temp:      TempSrc:      SpO2:  98%    Weight:      Height:        Isolation Precautions No active isolations  Medications Medications  Chlorhexidine Gluconate Cloth 2 % PADS 6 each (has no administration in time range)  insulin aspart (novoLOG) injection 0-6 Units (has no administration in time range)  iohexol (OMNIPAQUE) 350 MG/ML injection 100 mL (100 mLs Intravenous Contrast Given 01/21/23 1708)    Mobility walks

## 2023-01-21 NOTE — Subjective & Objective (Signed)
Patient known history of end-stage renal disease dialysis on Tuesday Thursday and Saturday came in with worsening edema of his lower extremities and penis which have alerted his family and they brought him into [facility] emergency department he is able to lay flat no significant shortness of breath had asked Dopplers done of his lower extremities recently which did not show blood clot but did show right heart dysfunction Patient had troubles receiving full hemodialysis he was not able to finish his Saturday hemodialysis due to cramping and did not go today at all because of severe edema Often has low blood pressure

## 2023-01-21 NOTE — Assessment & Plan Note (Addendum)
Nephrology is aware if blood pressure allows would proceed with hemodialysis otherwise may need CVVHD Will need further discussion with family regarding overall prognosis

## 2023-01-21 NOTE — Assessment & Plan Note (Signed)
Order sliding scale  

## 2023-01-21 NOTE — ED Triage Notes (Signed)
Patient's son reports progressively worsening edema on legs and scrotum. Seen by PCP and had an ultrasound that was negative. Patient is on dialyisis, last treatment was Saturday. Patient also has hx of CHF and not on medications for this.

## 2023-01-21 NOTE — H&P (Signed)
Tyler Hahn VHQ:[id number] DOB: 06/20/1940 DOA: 01/21/2023     PCP: Tyler Hatch, MD   Outpatient Specialists:    NEphrology: Dr. Ronalee Hahn, Tyler Moras, MD    GI  Dr.  Shearon Hahn) Tyler Dare, MD    Patient arrived to ER on 01/21/23 at 1042 Referred by Attending Tyler Doyne, MD   Patient coming from:    home Lives  With family      Chief Complaint:   Chief Complaint  Patient presents with   Edema    HPI: Tyler Hahn is a 82 y.o. male with medical history significant of end-stage renal disease on hemodialysis Tuesday Thursday Saturday, DM2, HTN, CHF status post AICD, HLD fatty liver disease, gout, dementia, hypothyroidism, GERD    Presented with generalized swelling worse in his lower extremities and penis Patient known history of end-stage renal disease dialysis on Tuesday Thursday and Saturday came in with worsening edema of his lower extremities and penis which have alerted his family and they brought him into [facility] emergency department he is able to lay flat no significant shortness of breath had asked Dopplers done of his lower extremities recently which did not show blood clot but did show right heart dysfunction Patient had troubles receiving full hemodialysis he was not able to finish his Saturday hemodialysis due to cramping and did not go today at all because of severe edema Often has low blood pressure Patient denies any chest pain but has been a bit more short of breath than his baseline  Of note patient has small second toe right foot ulcer that has been followed and had negative imaging done yesterday there is no foul discharge to suggest infection Family denies any fevers or chills or cough Denies significant ETOH intake   Does not smoke   Lab Results  Component Value Date   SARSCOV2NAA NEGATIVE 05/31/2022   SARSCOV2NAA NEGATIVE 04/16/2022   SARSCOV2NAA NEGATIVE 11/03/2020   SARSCOV2NAA NEGATIVE 08/30/2019         Regarding pertinent Chronic problems:     Hyperlipidemia -  on statins Lipitor (atorvastatin)  Lipid Panel     Component Value Date/Time   CHOL 145 02/25/2022 1051   TRIG 200.0 (H) 02/25/2022 1051   HDL 39.20 02/25/2022 1051   CHOLHDL 4 02/25/2022 1051   VLDL 40.0 02/25/2022 1051   LDLCALC 66 02/25/2022 1051   LDLCALC 83 07/04/2017 1555   LDLDIRECT 94.0 08/03/2021 1312     HTN on has been running low blood pressures so no longer takes anything   chronic CHF   no records of EF Nuclear myocardial perfusion study showing dilated left ventricle       DM 2 -  Lab Results  Component Value Date   HGBA1C 7.1 (H) 06/05/2022    diet controlled   Hypothyroidism:   Lab Results  Component Value Date   TSH 5.78 (H) 05/01/2022   T3TOTAL 2.2 08/17/2009   T4TOTAL 6.7 08/17/2009   on synthroid    End-stage renal disease Estimated Creatinine Clearance: 7.6 mL/min (A) (by C-G formula based on SCr of 8.14 mg/dL (H)).  Lab Results  Component Value Date   CREATININE 8.14 (H) 01/21/2023   CREATININE 7.08 (H) 10/01/2022   CREATININE 5.59 (HH) 06/05/2022   Lab Results  Component Value Date   NA 136 01/21/2023   CL 95 (L) 01/21/2023   K 4.8 01/21/2023   CO2 21 (L) 01/21/2023   BUN 72 (H) 01/21/2023  CREATININE 8.14 (H) 01/21/2023   GFRNONAA 6 (L) 01/21/2023   CALCIUM 8.9 01/21/2023   PHOS 3.8 09/03/2019   ALBUMIN 3.3 (L) 01/21/2023   GLUCOSE 115 (H) 01/21/2023     Dementia - on Aricept     Chronic anemia - baseline hg Hemoglobin & Hematocrit  Recent Labs    05/31/22 1549 10/01/22 0034 01/21/23 1500  HGB 11.5* 9.8* 12.2*   Iron/TIBC/Ferritin/ %Sat    Component Value Date/Time   IRON 17 (L) 08/31/2019 0919   TIBC 196 (L) 08/31/2019 0919   FERRITIN 316 08/31/2019 0919   IRONPCTSAT 9 (L) 08/31/2019 0919       While in ER:  Nephrology was consulted and requesting admission to [facility] Will try to hemodialyzed tomorrow    Lab Orders         CBC          Comprehensive metabolic panel         Hepatitis B surface antigen         Hepatitis B surface antibody,quantitative        CXR - Enlarged heart with vascular congestion.  Defibrillator.   Underinflation.   Limited lateral view  CTabd/pelvis -  No evidence of venous thrombus in the abdomen, pelvis or lower extremities to the level of the knee.   Cardiomegaly. Reflux of contrast into the hepatic veins and IVC suggest right heart dysfunction.   Aortoiliac atherosclerosis.   Sigmoid diverticulosis.   Trace bilateral pleural effusions.   Following Medications were ordered in ER: Medications  Chlorhexidine Gluconate Cloth 2 % PADS 6 each (has no administration in time range)  iohexol (OMNIPAQUE) 350 MG/ML injection 100 mL (100 mLs Intravenous Contrast Given 01/21/23 1708)    _______________________________________________________ ER Provider Called:       Dr. Arrie Aran They Recommend admit to medicine   Will see in AM       ED Triage Vitals  Encounter Vitals Group     BP 01/21/23 1057 113/73     Systolic BP Percentile --      Diastolic BP Percentile --      Pulse Rate 01/21/23 1057 88     Resp 01/21/23 1057 18     Temp 01/21/23 1057 97.9 F (36.6 C)     Temp Source 01/21/23 1057 Oral     SpO2 01/21/23 1057 99 %     Weight 01/21/23 1059 188 lb 4 oz (85.4 kg)     Height 01/21/23 1059 5\' 7"  (1.702 m)     Head Circumference --      Peak Flow --      Pain Score --      Pain Loc --      Pain Education --      Exclude from Growth Chart --   ZOXW(96)@     _________________________________________ Significant initial  Findings: Abnormal Labs Reviewed  CBC - Abnormal; Notable for the following components:      Result Value   RBC 3.98 (*)    Hemoglobin 12.2 (*)    HCT 37.5 (*)    RDW 20.2 (*)    Platelets 59 (*)    nRBC 2.3 (*)    All other components within normal limits  COMPREHENSIVE METABOLIC PANEL - Abnormal; Notable for the following components:   Chloride  95 (*)    CO2 21 (*)    Glucose, Bld 115 (*)    BUN 72 (*)    Creatinine, Ser 8.14 (*)  Total Protein 8.2 (*)    Albumin 3.3 (*)    GFR, Estimated 6 (*)    Anion gap 20 (*)    All other components within normal limits      _________________________ Troponin  ordered   ECG: Ordered    BNP (last 3 results) Recent Labs    05/31/22 1549  BNP >4,500.0*     COVID-19 Labs  No results for input(s): "DDIMER", "FERRITIN", "LDH", "CRP" in the last 72 hours.  Lab Results  Component Value Date   SARSCOV2NAA NEGATIVE 05/31/2022   SARSCOV2NAA NEGATIVE 04/16/2022   SARSCOV2NAA NEGATIVE 11/03/2020   SARSCOV2NAA NEGATIVE 08/30/2019     The recent clinical data is shown below. Vitals:   01/21/23 1600 01/21/23 1725 01/21/23 1920 01/21/23 1930  BP: 98/64 98/64 103/70   Pulse:  79 77 77  Resp: (!) 21 (!) 21 (!) 22 (!) 26  Temp:  (!) 97.5 F (36.4 C)    TempSrc:      SpO2:  100%  98%  Weight:      Height:         WBC     Component Value Date/Time   WBC 7.3 01/21/2023 1500   LYMPHSABS 0.7 10/01/2022 0034   MONOABS 0.5 10/01/2022 0034   EOSABS 0.2 10/01/2022 0034   BASOSABS 0.0 10/01/2022 0034     Lactic Acid, Venous    Component Value Date/Time   LATICACIDVEN 6.0 (HH) 01/21/2023 2056   ______________________________________ Recent Labs  Lab 01/21/23 1610  NA 136  K 4.8  CO2 21*  GLUCOSE 115*  BUN 72*  CREATININE 8.14*  CALCIUM 8.9    Cr    stable,  Lab Results  Component Value Date   CREATININE 8.14 (H) 01/21/2023   CREATININE 7.08 (H) 10/01/2022   CREATININE 5.59 (HH) 06/05/2022    Recent Labs  Lab 01/21/23 1610  AST 29  ALT 22  ALKPHOS 86  BILITOT 0.8  PROT 8.2*  ALBUMIN 3.3*   Lab Results  Component Value Date   CALCIUM 8.9 01/21/2023   PHOS 3.8 09/03/2019          Plt: Lab Results  Component Value Date   PLT 59 (L) 01/21/2023    Recent Labs  Lab 01/21/23 1500  WBC 7.3  HGB 12.2*  HCT 37.5*  MCV 94.2  PLT 59*    HG/HCT    stable,     Component Value Date/Time   HGB 12.2 (L) 01/21/2023 1500   HGB 12.2 (L) 09/20/2020 1331   HCT 37.5 (L) 01/21/2023 1500   HCT 37.7 09/20/2020 1331   MCV 94.2 01/21/2023 1500   MCV 89 09/20/2020 1331     _______________________________________________ Hospitalist was called for admission for edema   The following Work up has been ordered so far:  Orders Placed This Encounter  Procedures   DG Chest 2 View   CT VENOGRAM ABD/PELVIS/LOWER EXT BILAT   CBC   Comprehensive metabolic panel   Hepatitis B surface antigen   Hepatitis B surface antibody,quantitative   Informed Consent Details: Physician/Practitioner Attestation; Transcribe to consent form and obtain patient signature   Pre-Hemodialysis Protocol - Day of Dialysis   Post-Dialysis Protocol - Day of Dialysis   Cardiac Monitoring Continuous x 24 hours Indications for use: Other; Other indications for use: ESRD   Consult to nephrology   Consult to hospitalist  Needs to go to [facility] for dialysis.   Hemodialysis inpatient   Admit to Inpatient (patient's expected length of  stay will be greater than 2 midnights or inpatient only procedure)     OTHER Significant initial  Findings:  labs showing:     DM  labs:  HbA1C: Recent Labs    02/25/22 1051 06/05/22 1118  HGBA1C 5.8 7.1*       CBG (last 3)  No results for input(s): "GLUCAP" in the last 72 hours.        Cultures:    Component Value Date/Time   SDES BLOOD LEFT ARM 09/03/2019 0919   SDES BLOOD LEFT HAND 09/03/2019 0919   SPECREQUEST  09/03/2019 0919    BOTTLES DRAWN AEROBIC AND ANAEROBIC Blood Culture adequate volume   SPECREQUEST  09/03/2019 0919    BOTTLES DRAWN AEROBIC AND ANAEROBIC Blood Culture adequate volume   CULT  09/03/2019 0919    NO GROWTH 5 DAYS Performed at [facility], [address] [place] [place], [place] [address]    CULT  09/03/2019 0919    NO GROWTH 5 DAYS Performed at [facility], [address] [place], [place] [address]    REPTSTATUS 09/08/2019 FINAL 09/03/2019 0919   REPTSTATUS 09/08/2019 FINAL 09/03/2019 0919     Radiological Exams on Admission: CT VENOGRAM ABD/PELVIS/LOWER EXT BILAT  Result Date: 01/21/2023 CLINICAL DATA:  Worsening edema and legs. EXAM: CT VENOGRAM ABDOMEN AND PELVIS AND LOWER EXTREMITY BILATERAL TECHNIQUE: Venographic phase images of the abdomen, pelvis and lower extremities were obtained following the administration of intravenous contrast. Multiplanar reformats and maximum intensity projections were generated. RADIATION DOSE REDUCTION: This exam was performed according to the departmental dose-optimization program which includes automated exposure control, adjustment of the mA and/or kV according to patient size and/or use of iterative reconstruction technique. CONTRAST:  OMNIPAQUE IOHEXOL 350 MG/ML SOLN COMPARISON:  None Available. FINDINGS: Lower chest: Cardiomegaly. Pacer wires partially visualized in the right heart. Aortic atherosclerosis. Trace bilateral pleural effusions. Hepatobiliary: Reflux of contrast into the IVC and hepatic veins compatible with right heart dysfunction. No focal hepatic abnormality. Gallbladder unremarkable. Pancreas: No focal abnormality or ductal dilatation. Spleen: No focal abnormality.  Normal size. Adrenals/Urinary Tract: Adrenal glands normal. Kidneys are atrophic. Numerous bilateral renal cysts which appear benign. No follow-up imaging recommended. No visible stones or hydronephrosis. Urinary bladder decompressed, grossly unremarkable. Stomach/Bowel: Normal appendix. Sigmoid diverticulosis. No active diverticulitis. Stomach and small bowel decompressed, unremarkable. Vascular/Lymphatic: Diffuse aortoiliac atherosclerosis. No aneurysm or dissection. No adenopathy. No visible IVC or iliac venous clot. Reproductive: No visible focal abnormality. Other: No free fluid or free air. Musculoskeletal: No acute bony abnormality. IVC: No evidence  for thrombus or stenosis. Portal and mesenteric veins: No evidence for thrombus or stenosis. Bilateral iliac veins: No evidence for thrombus or stenosis. Right lower extremity: No evidence for thrombus or stenosis to the level of the knee. Left lower extremity: No evidence for thrombus or stenosis to the level of the knee. IMPRESSION: No evidence of venous thrombus in the abdomen, pelvis or lower extremities to the level of the knee. Cardiomegaly. Reflux of contrast into the hepatic veins and IVC suggest right heart dysfunction. Aortoiliac atherosclerosis. Sigmoid diverticulosis. Trace bilateral pleural effusions. Electronically Signed   By: Charlett Nose M.D.   On: 01/21/2023 17:33   DG Chest 2 View  Result Date: 01/21/2023 CLINICAL DATA:  Shortness of breath EXAM: CHEST - 2 VIEW COMPARISON:  X-ray 05/31/2022 FINDINGS: Enlarged cardiopericardial silhouette. Central vascular congestion. Calcified tortuous aorta. Left upper chest defibrillator. Underinflation. No consolidation, pneumothorax or edema. Overlapping cardiac leads. Lateral view is limited by  under penetration. IMPRESSION: Enlarged heart with vascular congestion.  Defibrillator. Underinflation. Limited lateral view Electronically Signed   By: Karen Kays M.D.   On: 01/21/2023 16:15   _______________________________________________________________________________________________________ Latest  Blood pressure 103/70, pulse 77, temperature (!) 97.5 F (36.4 C), resp. rate (!) 26, height 5\' 7"  (1.702 m), weight 85.4 kg, SpO2 98%.   Vitals  labs and radiology finding personally reviewed  Review of Systems:    Pertinent positives include:   fatigue, Bilateral lower extremity swelling  Constitutional:  No weight loss, night sweats, Fevers, chills, weight loss  HEENT:  No headaches, Difficulty swallowing,Tooth/dental problems,Sore throat,  No sneezing, itching, ear ache, nasal congestion, post nasal drip,  Cardio-vascular:  No chest pain,  Orthopnea, PND, anasarca, dizziness, palpitations.no  GI:  No heartburn, indigestion, abdominal pain, nausea, vomiting, diarrhea, change in bowel habits, loss of appetite, melena, blood in stool, hematemesis Resp:  no shortness of breath at rest. No dyspnea on exertion, No excess mucus, no productive cough, No non-productive cough, No coughing up of blood.No change in color of mucus.No wheezing. Skin:  no rash or lesions. No jaundice GU:  no dysuria, change in color of urine, no urgency or frequency. No straining to urinate.  No flank pain.  Musculoskeletal:  No joint pain or no joint swelling. No decreased range of motion. No back pain.  Psych:  No change in mood or affect. No depression or anxiety. No memory loss.  Neuro: no localizing neurological complaints, no tingling, no weakness, no double vision, no gait abnormality, no slurred speech, no confusion  All systems reviewed and apart from HOPI all are negative _______________________________________________________________________________________________ Past Medical History:   Past Medical History:  Diagnosis Date   Anxiety    Automatic implantable cardioverter-defibrillator in situ    greg taylor   CHF (congestive heart failure) (HCC)    2000   Diabetes mellitus    no meds   Fatty liver    Gout    "bout 2-3 months ago"-meds helped.   Hyperlipidemia    Hypertension    Hyperthyroidism    Kidney cysts    PONV (postoperative nausea and vomiting)    Renal insufficiency       Past Surgical History:  Procedure Laterality Date   AV FISTULA PLACEMENT Right 08/23/2019   Procedure: ARTERIOVENOUS GORTEX GRAFT RIGHT ARM;  Surgeon: Larina Earthly, MD;  Location: [facility] OR;  Service: Vascular;  Laterality: Right;   CARDIAC DEFIBRILLATOR PLACEMENT     catract     COLONOSCOPY WITH PROPOFOL N/A 10/03/2015   Procedure: COLONOSCOPY WITH PROPOFOL;  Surgeon: Tyler Dare, MD;  Location: [facility] ENDOSCOPY;  Service: Endoscopy;  Laterality:  N/A;   DIAGNOSTIC LAPAROSCOPY  01/27/2014   Dr Luisa Hart   ENTEROSCOPY N/A 11/25/2013   Procedure: ENTEROSCOPY;  Surgeon: Tyler Dare, MD;  Location: [facility] ENDOSCOPY;  Service: Endoscopy;  Laterality: N/A;   ICD GENERATOR CHANGEOUT N/A 11/06/2020   Procedure: ICD GENERATOR CHANGEOUT;  Surgeon: Duke Salvia, MD;  Location: [facility] INVASIVE CV LAB;  Service: Cardiovascular;  Laterality: N/A;   ICD,[place] [employer]     LAPAROSCOPY N/A 01/27/2014   Procedure: LAPAROSCOPY DIAGNOSTIC;  Surgeon: Clovis Pu. Cornett, MD;  Location: [facility] OR;  Service: General;  Laterality: N/A;   polyp removal throat     difficulty speaking    Social History:     reports that he quit smoking about 24 years ago. His smoking use included cigarettes. He has never used smokeless tobacco. He reports that he  does not drink alcohol and does not use drugs.     Family History:   Family History  Problem Relation Age of Onset   Stomach cancer Mother    Alzheimer's disease Mother    Diabetes Father    Heart disease Father    Liver disease Father    Kidney disease Father    ______________________________________________________________________________________________ Allergies: Allergies  Allergen Reactions   Sulfa Antibiotics Itching and Rash   Sulfonamide Derivatives Itching and Rash     Prior to Admission medications   Medication Sig Start Date End Date Taking? Authorizing Provider  allopurinol (ZYLOPRIM) 100 MG tablet TAKE 1 TABLET BY MOUTH 3 TIMES A WEEK ON MONDAY, [facility] AND FRIDAY 11/25/22   Tyler Hatch, MD  atorvastatin (LIPITOR) 20 MG tablet Take 1 tablet (20 mg total) by mouth daily. 04/24/22   Tyler Hatch, MD  donepezil (ARICEPT) 10 MG tablet TAKE 1 TABLET BY MOUTH EVERYDAY AT BEDTIME 06/03/22   Tyler Hatch, MD  doxycycline (VIBRA-TABS) 100 MG tablet Take 1 tablet (100 mg total) by mouth 2 (two) times daily. 01/20/23   Vivi Barrack, DPM  levothyroxine (SYNTHROID) 50 MCG tablet  Take 1 tablet (50 mcg total) by mouth daily. 04/23/22   Tyler Hatch, MD  loratadine (CLARITIN) 10 MG tablet Take 1 tablet (10 mg total) by mouth daily. 02/25/22   Tyler Hatch, MD  Multiple Vitamins-Minerals (MULTIVITAMIN WITH MINERALS) tablet Take 1 tablet by mouth daily.    [provider]  mupirocin ointment (BACTROBAN) 2 % Apply 1 Application topically 2 (two) times daily. 01/20/23   Vivi Barrack, DPM  omeprazole (PRILOSEC) 20 MG capsule Take 1 capsule (20 mg total) by mouth daily. 11/27/22   Tyler Hatch, MD  ondansetron (ZOFRAN-ODT) 4 MG disintegrating tablet 4mg  ODT q4 hours prn nausea/vomit Patient not taking: Reported on 01/16/2023 04/16/22   Gilda Crease, MD  sertraline (ZOLOFT) 100 MG tablet TAKE 1.5 TABLETS (150MG  TOTAL) BY MOUTH DAILY 10/07/22   Tyler Hatch, MD    ___________________________________________________________________________________________________ Physical Exam:    01/21/2023    7:30 PM 01/21/2023    7:20 PM 01/21/2023    5:25 PM  Vitals with BMI  Systolic  103 98  Diastolic  70 64  Pulse 77 77 79     1. General:  in No  Acute distress    Chronically ill  -appearing 2. Psychological:somnolent Oriented to self 3. Head/ENT:   Moist  Mucous Membranes                          Head Non traumatic, neck supple                           Poor Dentition 4. SKIN: normal  Skin turgor,  Skin clean Dry and intact no rash    5. Heart: Regular rate and rhythm no  Murmur, no Rub or gallop 6. Lungs:   no wheezes mild crackles   7. Abdomen: Soft,  non-tender, Non distended   obese  bowel sounds present 8. Lower extremities: no clubbing, cyanosis, 2+edema 9. Neurologically Grossly intact, moving all 4 extremities equally   10. MSK: Normal range of motion    Chart has been reviewed  ______________________________________________________________________________________________  Assessment/Plan 82 y.o. male with medical  history significant of end-stage renal disease on hemodialysis Tuesday Thursday Saturday, DM2, HTN, CHF status post AICD, HLD fatty  liver disease, gout, dementia, hypothyroidism, GERD    Admitted for edema, hypotension, lactic acidosis   Present on Admission:  Fluid overload  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HCC)  Hyperlipidemia  Essential hypertension  SYSTOLIC HEART FAILURE, CHRONIC  Hypothyroid  Thrombocytopenia (HCC)  Lactic acidosis  Hypotension  ESRD (end stage renal disease) (HCC)     Fluid overload Appreciate nephrology consult will need dialysis tomorrow given soft blood pressures will observe and progressive Patient has no new oxygen requirement  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HCC) Order sliding scale  Hyperlipidemia Continue Lipitor 20 mg a day  Essential hypertension Allow permissive hypertension  SYSTOLIC HEART FAILURE, CHRONIC Patient with chronic heart failure was not able to locate echogram in the system Obtain echogram Especially given evidence of right heart dysfunction in the setting of diffuse lower extremity edema  Hypothyroid Check TSH and continue Synthroid at 50 mcg a day  Thrombocytopenia (HCC) Chronic but getting progressively worse probably would benefit from follow-up with hematology  Lactic acidosis Family is discussing goals of care 9:18 PM Family decided to stay the course continue with progressive level of care avoid ICU at this time unless patient continues to decompensate.  Blood pressures improved with current interventions.  Will continue to monitor serial lactic acid most likely lactic acidosis in the setting of poor perfusion   Hypotension Patient has had some soft blood pressures for few days at least and has made his hemodialysis somewhat more difficult.  And has been made more sleepy Noted to have lactic acid at 6 Cool extremities Patient was given IV bolus  250 Ordered midodrine 5 mg p.o. 3 times daily now Blood pressure is improved after 110 down from 80s Patient became more arousable. Continue to cycle lactic acid Discussed case with nephrology According to family at this point they would like to attempt to stay the course Patient is DNR/DNI and would like to avoid over aggressive interventions but not quite ready for comfort care at this point admit to progressive ecchymosis, no blood pressure remains stable will be able to proceed with hemodialysis if continues to drop we will need to consider for CVVHD and ICU transfer Case discussed with PCCM who is aware we will need to reconsult if ICU transfer is needed Ordered palliative care consult Overall poor prognosis Given hypotension and evidence of right heart failure Obtain CTA to rule out PE   ESRD (end stage renal disease) [facility]) Nephrology is aware if blood pressure allows would proceed with hemodialysis otherwise may need CVVHD Will need further discussion with family regarding overall prognosis   Other plan as per orders.  DVT prophylaxis:  SCD     Code Status: DNR/DNI  as per  family  I had personally discussed CODE STATUS with patient and family  ACP   none      Family Communication:   Family  at  Bedside  plan of care was discussed  with   Son,    Diet diabetic hearth healthy   Disposition Plan:        To home once workup is complete and patient is stable   Following barriers for discharge:                              HD in AM Bp stable  Will need consultants to evaluate patient prior to discharge       Consult Orders  (From admission, onward)           Start     Ordered   01/21/23 1808  Consult to hospitalist  Needs to go to [facility] for dialysis.  Once       Comments: Needs to go to [facility] for dialysis.  Provider:  (Not yet assigned)  Question Answer Comment  Place call to: [facility]   Reason for Consult Admit      01/21/23  1808                               Would benefit from PT/OT eval prior to DC  Ordered                                       Palliative care    consulted                                   Consults called:     Treatment Team:  Terrial Rhodes, MD  Admission status:  ED Disposition     ED Disposition  Admit   Condition  --   Comment  Hospital Area: [facility] [[id number]]  Level of Care: Telemetry Medical [[id number]]  May admit patient to [facility] or [facility] if equivalent level of care is available:: No  Covid Evaluation: Asymptomatic - no recent exposure (last 10 days) testing not required  Diagnosis: Fluid overload [[id number]]  Admitting Physician: Tyler Hahn [[id number]]  Attending Physician: Tyler Hahn [[id number]]  Certification:: I certify this patient will need inpatient services for at least 2 midnights  Estimated Length of Stay: 2            inpatient     I Expect 2 midnight stay secondary to severity of patient's current illness need for inpatient interventions justified by the following:  hemodynamic instability despite optimal treatment ( hypotension )   Severe lab/radiological/exam abnormalities including:   Lactic acidosis generalized edema and extensive comorbidities including:  DM2   CHF ESRD     That are currently affecting medical management.   I expect  patient to be hospitalized for 2 midnights requiring inpatient medical care.  Patient is at high risk for adverse outcome (such as loss of life or disability) if not treated.  Indication for inpatient stay as follows:  Severe change from baseline regarding mental status   Need for operative/procedural  intervention     Need for I  IV fluids, HD   Level of care           progressive     stepdown   tele indefinitely please discontinue once patient no longer qualifies COVID-19 Labs      Critical   Patient is critically ill due to  hemodynamic instability  They  are at high risk for life/limb threatening clinical deterioration requiring frequent reassessment and modifications of care.  Services provided include examination of the patient, review of relevant ancillary tests, prescription of lifesaving therapies, review of medications and prophylactic therapy.  Total critical care time excluding separately billable procedures: 60*  Minutes.     01/21/2023, 10:16 PM    [facility]  after 2 AM please page floor coverage PA If 7AM-7PM, please contact the day team taking care of the patient using [email or web address]

## 2023-01-21 NOTE — ED Notes (Signed)
Unable to collect labs on pt. Hard stick

## 2023-01-21 NOTE — Assessment & Plan Note (Signed)
Continue Lipitor 20 mg a day.

## 2023-01-21 NOTE — Assessment & Plan Note (Signed)
Chronic but getting progressively worse probably would benefit from follow-up with hematology

## 2023-01-21 NOTE — ED Provider Notes (Signed)
[place] EMERGENCY DEPARTMENT AT [facility] Provider Note   CSN: [id number] Arrival date & time: 01/21/23  1042     History {Add pertinent medical, surgical, social history, OB history to HPI:1} Chief Complaint  Patient presents with   Edema    Tyler Hahn is a 82 y.o. male  HPI    Past Medical History:  Diagnosis Date   Anxiety    Automatic implantable cardioverter-defibrillator in situ    greg taylor   CHF (congestive heart failure) (HCC)    2000   Diabetes mellitus    no meds   Fatty liver    Gout    "bout 2-3 months ago"-meds helped.   Hyperlipidemia    Hypertension    Hyperthyroidism    Kidney cysts    PONV (postoperative nausea and vomiting)    Renal insufficiency    Past Surgical History:  Procedure Laterality Date   AV FISTULA PLACEMENT Right 08/23/2019   Procedure: ARTERIOVENOUS GORTEX GRAFT RIGHT ARM;  Surgeon: Larina Earthly, MD;  Location: [facility] OR;  Service: Vascular;  Laterality: Right;   CARDIAC DEFIBRILLATOR PLACEMENT     catract     COLONOSCOPY WITH PROPOFOL N/A 10/03/2015   Procedure: COLONOSCOPY WITH PROPOFOL;  Surgeon: Meryl Dare, MD;  Location: [facility] ENDOSCOPY;  Service: Endoscopy;  Laterality: N/A;   DIAGNOSTIC LAPAROSCOPY  01/27/2014   Dr Luisa Hart   ENTEROSCOPY N/A 11/25/2013   Procedure: ENTEROSCOPY;  Surgeon: Meryl Dare, MD;  Location: [facility] ENDOSCOPY;  Service: Endoscopy;  Laterality: N/A;   ICD GENERATOR CHANGEOUT N/A 11/06/2020   Procedure: ICD GENERATOR CHANGEOUT;  Surgeon: Duke Salvia, MD;  Location: [facility] INVASIVE CV LAB;  Service: Cardiovascular;  Laterality: N/A;   ICD,[place] [employer]     LAPAROSCOPY N/A 01/27/2014   Procedure: LAPAROSCOPY DIAGNOSTIC;  Surgeon: Clovis Pu. Cornett, MD;  Location: [facility] OR;  Service: General;  Laterality: N/A;   polyp removal throat     difficulty speaking     Home Medications Prior to Admission medications   Medication Sig Start Date End Date Taking? Authorizing Provider   allopurinol (ZYLOPRIM) 100 MG tablet TAKE 1 TABLET BY MOUTH 3 TIMES A WEEK ON MONDAY, [facility] AND FRIDAY 11/25/22   Sheliah Hatch, MD  atorvastatin (LIPITOR) 20 MG tablet Take 1 tablet (20 mg total) by mouth daily. 04/24/22   Sheliah Hatch, MD  donepezil (ARICEPT) 10 MG tablet TAKE 1 TABLET BY MOUTH EVERYDAY AT BEDTIME 06/03/22   Sheliah Hatch, MD  doxycycline (VIBRA-TABS) 100 MG tablet Take 1 tablet (100 mg total) by mouth 2 (two) times daily. 01/20/23   Vivi Barrack, DPM  levothyroxine (SYNTHROID) 50 MCG tablet Take 1 tablet (50 mcg total) by mouth daily. 04/23/22   Sheliah Hatch, MD  loratadine (CLARITIN) 10 MG tablet Take 1 tablet (10 mg total) by mouth daily. 02/25/22   Sheliah Hatch, MD  Multiple Vitamins-Minerals (MULTIVITAMIN WITH MINERALS) tablet Take 1 tablet by mouth daily.    [provider]  mupirocin ointment (BACTROBAN) 2 % Apply 1 Application topically 2 (two) times daily. 01/20/23   Vivi Barrack, DPM  omeprazole (PRILOSEC) 20 MG capsule Take 1 capsule (20 mg total) by mouth daily. 11/27/22   Sheliah Hatch, MD  ondansetron (ZOFRAN-ODT) 4 MG disintegrating tablet 4mg  ODT q4 hours prn nausea/vomit Patient not taking: Reported on 01/16/2023 04/16/22   Gilda Crease, MD  sertraline (ZOLOFT) 100 MG tablet TAKE 1.5 TABLETS (150MG  TOTAL) BY  MOUTH DAILY 10/07/22   Sheliah Hatch, MD      Allergies    Sulfa antibiotics and Sulfonamide derivatives    Review of Systems   Review of Systems  Physical Exam Updated Vital Signs BP 105/70   Pulse 82   Temp (!) 97.5 F (36.4 C)   Resp 17   Ht 5\' 7"  (1.702 m)   Wt 85.4 kg   SpO2 100%   BMI 29.48 kg/m  Physical Exam  ED Results / Procedures / Treatments   Labs (all labs ordered are listed, but only abnormal results are displayed) Labs Reviewed  CBC  COMPREHENSIVE METABOLIC PANEL    EKG None  Radiology No results found.  Procedures Procedures  {Document  cardiac monitor, telemetry assessment procedure when appropriate:1}  Medications Ordered in ED Medications - No data to display  ED Course/ Medical Decision Making/ A&P   {   Click here for ABCD2, HEART and other calculatorsREFRESH Note before signing :1}                              Medical Decision Making Amount and/or Complexity of Data Reviewed Labs: ordered.   ***  {Document critical care time when appropriate:1} {Document review of labs and clinical decision tools ie heart score, Chads2Vasc2 etc:1}  {Document your independent review of radiology images, and any outside records:1} {Document your discussion with family members, caretakers, and with consultants:1} {Document social determinants of health affecting pt's care:1} {Document your decision making why or why not admission, treatments were needed:1} Final Clinical Impression(s) / ED Diagnoses Final diagnoses:  None    Rx / DC Orders ED Discharge Orders     None

## 2023-01-21 NOTE — Assessment & Plan Note (Addendum)
Family is discussing goals of care 9:18 PM Family decided to stay the course continue with progressive level of care avoid ICU at this time unless patient continues to decompensate.  Blood pressures improved with current interventions.  Will continue to monitor serial lactic acid most likely lactic acidosis in the setting of poor perfusion

## 2023-01-21 NOTE — Assessment & Plan Note (Signed)
Check TSH and continue Synthroid at 50 mcg a day

## 2023-01-21 NOTE — Consult Note (Addendum)
[facility] Renal Consultation Note    Indication for Consultation:  Management of ESRD/hemodialysis; anemia, hypertension/volume and secondary hyperparathyroidism  HPI: Tyler Hahn is a 82 y.o. male with a PMH significant for DM type 2, HTN, CHF, NICM s/p ICD placement, HLD, and ESRD on HD TTS at [facility] who presented to [facility] ED c/o worsening edema of legs and scrotum.  In the ED, Temp 97.9, HR 88, Bp 113/73, SpO2 99%, wt 85.4kg.  Labs notable for Na 136, K 4.8, BUN 72, Cr 8.14, alb 3.3, Hgb 12.2.  CXR with enlarged heart and vascular congestion.  CT venogram negative for DVT but also showed right heart dysfunction.  Pt is being admitted and we were asked to provide dialysis during his hospitalization.  Of note, he did not go to dialysis today and signed off early on Saturday due to cramping.  He has large idwg and has not made his edw due to low bp.    Past Medical History:  Diagnosis Date   Anxiety    Automatic implantable cardioverter-defibrillator in situ    greg taylor   CHF (congestive heart failure) (HCC)    2000   Diabetes mellitus    no meds   Fatty liver    Gout    "bout 2-3 months ago"-meds helped.   Hyperlipidemia    Hypertension    Hyperthyroidism    Kidney cysts    PONV (postoperative nausea and vomiting)    Renal insufficiency    Past Surgical History:  Procedure Laterality Date   AV FISTULA PLACEMENT Right 08/23/2019   Procedure: ARTERIOVENOUS GORTEX GRAFT RIGHT ARM;  Surgeon: Larina Earthly, MD;  Location: [facility] OR;  Service: Vascular;  Laterality: Right;   CARDIAC DEFIBRILLATOR PLACEMENT     catract     COLONOSCOPY WITH PROPOFOL N/A 10/03/2015   Procedure: COLONOSCOPY WITH PROPOFOL;  Surgeon: Meryl Dare, MD;  Location: [facility] END[facility];  Service: Endoscopy;  Laterality: N/A;   DIAGNOSTIC LAPAROSCOPY  01/27/2014   Dr Luisa Hart   ENTEROSCOPY N/A 11/25/2013   Procedure: ENTEROSCOPY;  Surgeon: Meryl Dare, MD;  Location: [facility] END[facility];  Service:  Endoscopy;  Laterality: N/A;   ICD GENERATOR CHANGEOUT N/A 11/06/2020   Procedure: ICD GENERATOR CHANGEOUT;  Surgeon: Duke Salvia, MD;  Location: [facility] INVASIVE CV LAB;  Service: Cardiovascular;  Laterality: N/A;   ICD,[place] [employer]     LAPAROSCOPY N/A 01/27/2014   Procedure: LAPAROSCOPY DIAGNOSTIC;  Surgeon: Clovis Pu. Cornett, MD;  Location: [facility] OR;  Service: General;  Laterality: N/A;   polyp removal throat     difficulty speaking   Family History:   Family History  Problem Relation Age of Onset   Stomach cancer Mother    Alzheimer's disease Mother    Diabetes Father    Heart disease Father    Liver disease Father    Kidney disease Father    Social History:  reports that he quit smoking about 24 years ago. His smoking use included cigarettes. He has never used smokeless tobacco. He reports that he does not drink alcohol and does not use drugs. Allergies  Allergen Reactions   Sulfa Antibiotics Itching and Rash   Sulfonamide Derivatives Itching and Rash   Prior to Admission medications   Medication Sig Start Date End Date Taking? Authorizing Provider  allopurinol (ZYLOPRIM) 100 MG tablet TAKE 1 TABLET BY MOUTH 3 TIMES A WEEK ON MONDAY, [facility] AND FRIDAY 11/25/22   Sheliah Hatch, MD  atorvastatin (LIPITOR) 20 MG  tablet Take 1 tablet (20 mg total) by mouth daily. 04/24/22   Sheliah Hatch, MD  donepezil (ARICEPT) 10 MG tablet TAKE 1 TABLET BY MOUTH EVERYDAY AT BEDTIME 06/03/22   Sheliah Hatch, MD  doxycycline (VIBRA-TABS) 100 MG tablet Take 1 tablet (100 mg total) by mouth 2 (two) times daily. 01/20/23   Vivi Barrack, DPM  levothyroxine (SYNTHROID) 50 MCG tablet Take 1 tablet (50 mcg total) by mouth daily. 04/23/22   Sheliah Hatch, MD  loratadine (CLARITIN) 10 MG tablet Take 1 tablet (10 mg total) by mouth daily. 02/25/22   Sheliah Hatch, MD  Multiple Vitamins-Minerals (MULTIVITAMIN WITH MINERALS) tablet Take 1 tablet by mouth daily.    [provider]  mupirocin ointment (BACTROBAN) 2 % Apply 1 Application topically 2 (two) times daily. 01/20/23   Vivi Barrack, DPM  omeprazole (PRILOSEC) 20 MG capsule Take 1 capsule (20 mg total) by mouth daily. 11/27/22   Sheliah Hatch, MD  ondansetron (ZOFRAN-ODT) 4 MG disintegrating tablet 4mg  ODT q4 hours prn nausea/vomit Patient not taking: Reported on 01/16/2023 04/16/22   Gilda Crease, MD  sertraline (ZOLOFT) 100 MG tablet TAKE 1.5 TABLETS (150MG  TOTAL) BY MOUTH DAILY 10/07/22   Sheliah Hatch, MD   No current facility-administered medications for this encounter.   Current Outpatient Medications  Medication Sig Dispense Refill   allopurinol (ZYLOPRIM) 100 MG tablet TAKE 1 TABLET BY MOUTH 3 TIMES A WEEK ON MONDAY, WEDNESDAY AND FRIDAY 12 tablet 2   atorvastatin (LIPITOR) 20 MG tablet Take 1 tablet (20 mg total) by mouth daily. 90 tablet 3   donepezil (ARICEPT) 10 MG tablet TAKE 1 TABLET BY MOUTH EVERYDAY AT BEDTIME 30 tablet 20   doxycycline (VIBRA-TABS) 100 MG tablet Take 1 tablet (100 mg total) by mouth 2 (two) times daily. 14 tablet 0   levothyroxine (SYNTHROID) 50 MCG tablet Take 1 tablet (50 mcg total) by mouth daily. 90 tablet 3   loratadine (CLARITIN) 10 MG tablet Take 1 tablet (10 mg total) by mouth daily. 90 tablet 3   Multiple Vitamins-Minerals (MULTIVITAMIN WITH MINERALS) tablet Take 1 tablet by mouth daily.     mupirocin ointment (BACTROBAN) 2 % Apply 1 Application topically 2 (two) times daily. 30 g 2   omeprazole (PRILOSEC) 20 MG capsule Take 1 capsule (20 mg total) by mouth daily. 90 capsule 1   ondansetron (ZOFRAN-ODT) 4 MG disintegrating tablet 4mg  ODT q4 hours prn nausea/vomit (Patient not taking: Reported on 01/16/2023) 10 tablet 0   sertraline (ZOLOFT) 100 MG tablet TAKE 1.5 TABLETS (150MG  TOTAL) BY MOUTH DAILY 45 tablet 2   Labs: Basic Metabolic Panel: Recent Labs  Lab 01/21/23 1610  NA 136  K 4.8  CL 95*  CO2 21*  GLUCOSE 115*  BUN 72*   CREATININE 8.14*  CALCIUM 8.9   Liver Function Tests: Recent Labs  Lab 01/21/23 1610  AST 29  ALT 22  ALKPHOS 86  BILITOT 0.8  PROT 8.2*  ALBUMIN 3.3*   No results for input(s): "LIPASE", "AMYLASE" in the last 168 hours. No results for input(s): "AMMONIA" in the last 168 hours. CBC: Recent Labs  Lab 01/21/23 1500  WBC 7.3  HGB 12.2*  HCT 37.5*  MCV 94.2  PLT 59*   Cardiac Enzymes: No results for input(s): "CKTOTAL", "CKMB", "CKMBINDEX", "TROPONINI" in the last 168 hours. CBG: No results for input(s): "GLUCAP" in the last 168 hours. Iron Studies: No results for input(s): "IRON", "TIBC", "TRANSFERRIN", "FERRITIN"  in the last 72 hours. Studies/Results: CT VENOGRAM ABD/PELVIS/LOWER EXT BILAT  Result Date: 01/21/2023 CLINICAL DATA:  Worsening edema and legs. EXAM: CT VENOGRAM ABDOMEN AND PELVIS AND LOWER EXTREMITY BILATERAL TECHNIQUE: Venographic phase images of the abdomen, pelvis and lower extremities were obtained following the administration of intravenous contrast. Multiplanar reformats and maximum intensity projections were generated. RADIATION DOSE REDUCTION: This exam was performed according to the departmental dose-optimization program which includes automated exposure control, adjustment of the mA and/or kV according to patient size and/or use of iterative reconstruction technique. CONTRAST:  OMNIPAQUE IOHEXOL 350 MG/ML SOLN COMPARISON:  None Available. FINDINGS: Lower chest: Cardiomegaly. Pacer wires partially visualized in the right heart. Aortic atherosclerosis. Trace bilateral pleural effusions. Hepatobiliary: Reflux of contrast into the IVC and hepatic veins compatible with right heart dysfunction. No focal hepatic abnormality. Gallbladder unremarkable. Pancreas: No focal abnormality or ductal dilatation. Spleen: No focal abnormality.  Normal size. Adrenals/Urinary Tract: Adrenal glands normal. Kidneys are atrophic. Numerous bilateral renal cysts which appear  benign. No follow-up imaging recommended. No visible stones or hydronephrosis. Urinary bladder decompressed, grossly unremarkable. Stomach/Bowel: Normal appendix. Sigmoid diverticulosis. No active diverticulitis. Stomach and small bowel decompressed, unremarkable. Vascular/Lymphatic: Diffuse aortoiliac atherosclerosis. No aneurysm or dissection. No adenopathy. No visible IVC or iliac venous clot. Reproductive: No visible focal abnormality. Other: No free fluid or free air. Musculoskeletal: No acute bony abnormality. IVC: No evidence for thrombus or stenosis. Portal and mesenteric veins: No evidence for thrombus or stenosis. Bilateral iliac veins: No evidence for thrombus or stenosis. Right lower extremity: No evidence for thrombus or stenosis to the level of the knee. Left lower extremity: No evidence for thrombus or stenosis to the level of the knee. IMPRESSION: No evidence of venous thrombus in the abdomen, pelvis or lower extremities to the level of the knee. Cardiomegaly. Reflux of contrast into the hepatic veins and IVC suggest right heart dysfunction. Aortoiliac atherosclerosis. Sigmoid diverticulosis. Trace bilateral pleural effusions. Electronically Signed   By: Charlett Nose M.D.   On: 01/21/2023 17:33   DG Chest 2 View  Result Date: 01/21/2023 CLINICAL DATA:  Shortness of breath EXAM: CHEST - 2 VIEW COMPARISON:  X-ray 05/31/2022 FINDINGS: Enlarged cardiopericardial silhouette. Central vascular congestion. Calcified tortuous aorta. Left upper chest defibrillator. Underinflation. No consolidation, pneumothorax or edema. Overlapping cardiac leads. Lateral view is limited by under penetration. IMPRESSION: Enlarged heart with vascular congestion.  Defibrillator. Underinflation. Limited lateral view Electronically Signed   By: Karen Kays M.D.   On: 01/21/2023 16:15    ROS: Pertinent items are noted in HPI. Physical Exam: Vitals:   01/21/23 1400 01/21/23 1548 01/21/23 1600 01/21/23 1725  BP: 105/70  109/66 98/64 98/64   Pulse: 82   79  Resp: 17 18 (!) 21 (!) 21  Temp: (!) 97.5 F (36.4 C)   (!) 97.5 F (36.4 C)  TempSrc:      SpO2: 100%   100%  Weight:      Height:          Weight change:  No intake or output data in the 24 hours ending 01/21/23 1833 BP 98/64   Pulse 79   Temp (!) 97.5 F (36.4 C)   Resp (!) 21   Ht 5\' 7"  (1.702 m)   Wt 85.4 kg   SpO2 100%   BMI 29.48 kg/m  General appearance: alert, cooperative, and no distress Head: Normocephalic, without obvious abnormality, atraumatic Resp: clear to auscultation bilaterally Cardio: regular rate and rhythm, S1, S2 normal, no murmur, click,  rub or gallop GI: soft, non-tender; bowel sounds normal; no masses,  no organomegaly Extremities: edema 2+ pitting edema bilateral lower extremities and RUE AVG +T/B Dialysis Access:  Dialysis Orders: Center: [facility]  on TTS . EDW 81.5kg HD Bath 2K/2.5Ca  Time 3:30 Heparin 4000 units IVP. Access AVG BFR 400 DFR A1.5    Calcitriol 1.5 mcg po/HD   Other Sensipar 60 mg po TIW  Assessment/Plan:  Volume overload - has been having issues reaching his edw with HD due to large idwg, low bp, and cramping.  He will likely need serial HD given his low bp and volume overload.  He may even require CRRT if unable to tolerate UF with IHD.    ESRD -  as above, will try to get HD tonight if he can be transferred to [facility], otherwise will plan on it tomorrow morning  Hypertension/volume  -  as above. BP low and will need IV albumin with HD and possible midodrine to support Bp.   Anemia  - stable  Metabolic bone disease -   continue with home meds  Nutrition -  renal diet, carb modified.  ICMP - would recommend repeat ECHO to further evaluate his hypotension and worsening edema.  He may have RV dysfunction based on CT study.  Irena Cords, MD [facility], [facility] 01/21/2023, 6:33 PM    There is a shortage on beds at [facility] and pt will be transferred tomorrow morning for HD.

## 2023-01-21 NOTE — Assessment & Plan Note (Signed)
Patient with chronic heart failure was not able to locate echogram in the system Obtain echogram Especially given evidence of right heart dysfunction in the setting of diffuse lower extremity edema

## 2023-01-22 ENCOUNTER — Inpatient Hospital Stay (HOSPITAL_COMMUNITY): Payer: Medicare HMO

## 2023-01-22 ENCOUNTER — Other Ambulatory Visit (HOSPITAL_COMMUNITY): Payer: Medicare HMO

## 2023-01-22 ENCOUNTER — Other Ambulatory Visit: Payer: Self-pay | Admitting: *Deleted

## 2023-01-22 DIAGNOSIS — E162 Hypoglycemia, unspecified: Secondary | ICD-10-CM | POA: Diagnosis not present

## 2023-01-22 DIAGNOSIS — Z7189 Other specified counseling: Secondary | ICD-10-CM

## 2023-01-22 DIAGNOSIS — I5031 Acute diastolic (congestive) heart failure: Secondary | ICD-10-CM | POA: Diagnosis not present

## 2023-01-22 DIAGNOSIS — I9589 Other hypotension: Secondary | ICD-10-CM

## 2023-01-22 DIAGNOSIS — E039 Hypothyroidism, unspecified: Secondary | ICD-10-CM | POA: Diagnosis not present

## 2023-01-22 DIAGNOSIS — I10 Essential (primary) hypertension: Secondary | ICD-10-CM | POA: Diagnosis not present

## 2023-01-22 DIAGNOSIS — E1169 Type 2 diabetes mellitus with other specified complication: Secondary | ICD-10-CM

## 2023-01-22 DIAGNOSIS — I5023 Acute on chronic systolic (congestive) heart failure: Secondary | ICD-10-CM

## 2023-01-22 DIAGNOSIS — Z515 Encounter for palliative care: Secondary | ICD-10-CM

## 2023-01-22 DIAGNOSIS — I739 Peripheral vascular disease, unspecified: Secondary | ICD-10-CM

## 2023-01-22 DIAGNOSIS — N186 End stage renal disease: Secondary | ICD-10-CM | POA: Diagnosis not present

## 2023-01-22 LAB — COMPREHENSIVE METABOLIC PANEL
ALT: 18 U/L (ref 0–44)
AST: 23 U/L (ref 15–41)
Albumin: 3 g/dL — ABNORMAL LOW (ref 3.5–5.0)
Alkaline Phosphatase: 75 U/L (ref 38–126)
Anion gap: 28 — ABNORMAL HIGH (ref 5–15)
BUN: 74 mg/dL — ABNORMAL HIGH (ref 8–23)
CO2: 17 mmol/L — ABNORMAL LOW (ref 22–32)
Calcium: 8.5 mg/dL — ABNORMAL LOW (ref 8.9–10.3)
Chloride: 90 mmol/L — ABNORMAL LOW (ref 98–111)
Creatinine, Ser: 8.78 mg/dL — ABNORMAL HIGH (ref 0.61–1.24)
GFR, Estimated: 6 mL/min — ABNORMAL LOW (ref >60)
Glucose, Bld: 212 mg/dL — ABNORMAL HIGH (ref 70–99)
Potassium: 4.8 mmol/L (ref 3.5–5.1)
Sodium: 135 mmol/L (ref 135–145)
Total Bilirubin: 0.8 mg/dL (ref 0.3–1.2)
Total Protein: 7.6 g/dL (ref 6.5–8.1)

## 2023-01-22 LAB — GLUCOSE, CAPILLARY
Glucose-Capillary: 126 mg/dL — ABNORMAL HIGH (ref 70–99)
Glucose-Capillary: 132 mg/dL — ABNORMAL HIGH (ref 70–99)
Glucose-Capillary: 175 mg/dL — ABNORMAL HIGH (ref 70–99)
Glucose-Capillary: 183 mg/dL — ABNORMAL HIGH (ref 70–99)
Glucose-Capillary: 200 mg/dL — ABNORMAL HIGH (ref 70–99)
Glucose-Capillary: 205 mg/dL — ABNORMAL HIGH (ref 70–99)
Glucose-Capillary: 222 mg/dL — ABNORMAL HIGH (ref 70–99)
Glucose-Capillary: 223 mg/dL — ABNORMAL HIGH (ref 70–99)
Glucose-Capillary: 229 mg/dL — ABNORMAL HIGH (ref 70–99)
Glucose-Capillary: 238 mg/dL — ABNORMAL HIGH (ref 70–99)

## 2023-01-22 LAB — MAGNESIUM: Magnesium: 2 mg/dL (ref 1.7–2.4)

## 2023-01-22 LAB — PHOSPHORUS: Phosphorus: 6.7 mg/dL — ABNORMAL HIGH (ref 2.5–4.6)

## 2023-01-22 LAB — HEPATITIS B SURFACE ANTIGEN: Hepatitis B Surface Ag: NONREACTIVE

## 2023-01-22 MED ORDER — CHLORHEXIDINE GLUCONATE CLOTH 2 % EX PADS: 6.0000 | MEDICATED_PAD | Freq: Every day | CUTANEOUS | Status: DC

## 2023-01-22 MED ORDER — LEVOTHYROXINE SODIUM 50 MCG PO TABS
50.0000 ug | ORAL_TABLET | Freq: Every day | ORAL | Status: DC
  Administered 2023-01-23 – 2023-01-25 (×3): 50 ug via ORAL
  Filled 2023-01-22 (×3): qty 1

## 2023-01-22 MED ORDER — ONDANSETRON HCL 4 MG/2ML IJ SOLN: 4.0000 mg | Freq: Four times a day (QID) | INTRAMUSCULAR | Status: DC | PRN

## 2023-01-22 MED ORDER — DEXTROSE 5 % IV SOLN: INTRAVENOUS | Status: DC

## 2023-01-22 MED ORDER — SERTRALINE HCL 100 MG PO TABS
100.0000 mg | ORAL_TABLET | Freq: Every day | ORAL | Status: DC
  Administered 2023-01-22 – 2023-01-25 (×4): 100 mg via ORAL
  Filled 2023-01-22 (×4): qty 1

## 2023-01-22 MED ORDER — SODIUM CHLORIDE 0.9% FLUSH: 3.0000 mL | INTRAVENOUS | Status: DC | PRN

## 2023-01-22 MED ORDER — MUPIROCIN 2 % EX OINT
1.0000 | TOPICAL_OINTMENT | Freq: Two times a day (BID) | CUTANEOUS | Status: DC
  Administered 2023-01-22 – 2023-01-25 (×8): 1 via TOPICAL
  Filled 2023-01-22: qty 22

## 2023-01-22 MED ORDER — ACETAMINOPHEN 325 MG PO TABS: 650.0000 mg | ORAL_TABLET | Freq: Four times a day (QID) | ORAL | Status: DC | PRN

## 2023-01-22 MED ORDER — DOXYCYCLINE HYCLATE 100 MG PO TABS
100.0000 mg | ORAL_TABLET | Freq: Two times a day (BID) | ORAL | Status: DC
  Administered 2023-01-22 (×2): 100 mg via ORAL
  Filled 2023-01-22 (×2): qty 1

## 2023-01-22 MED ORDER — PANTOPRAZOLE SODIUM 40 MG PO TBEC
40.0000 mg | DELAYED_RELEASE_TABLET | Freq: Every day | ORAL | Status: DC
  Administered 2023-01-22 – 2023-01-25 (×4): 40 mg via ORAL
  Filled 2023-01-22 (×4): qty 1

## 2023-01-22 MED ORDER — GLUCAGON HCL RDNA (DIAGNOSTIC) 1 MG IJ SOLR: 1.0000 mg | Freq: Once | INTRAMUSCULAR | Status: DC | PRN

## 2023-01-22 MED ORDER — DONEPEZIL HCL 10 MG PO TABS
10.0000 mg | ORAL_TABLET | Freq: Every day | ORAL | Status: DC
  Administered 2023-01-22 – 2023-01-24 (×4): 10 mg via ORAL
  Filled 2023-01-22 (×4): qty 1

## 2023-01-22 MED ORDER — PERFLUTREN LIPID MICROSPHERE
1.0000 mL | INTRAVENOUS | Status: AC | PRN
  Administered 2023-01-22: 3 mL via INTRAVENOUS

## 2023-01-22 MED ORDER — LORATADINE 10 MG PO TABS
10.0000 mg | ORAL_TABLET | Freq: Every day | ORAL | Status: DC
  Administered 2023-01-22 – 2023-01-25 (×4): 10 mg via ORAL
  Filled 2023-01-22 (×4): qty 1

## 2023-01-22 MED ORDER — ONDANSETRON HCL 4 MG PO TABS: 4.0000 mg | ORAL_TABLET | Freq: Four times a day (QID) | ORAL | Status: DC | PRN

## 2023-01-22 MED ORDER — SODIUM CHLORIDE 0.9 % IV SOLN: 250.0000 mL | INTRAVENOUS | Status: DC | PRN

## 2023-01-22 MED ORDER — ATORVASTATIN CALCIUM 10 MG PO TABS
20.0000 mg | ORAL_TABLET | Freq: Every day | ORAL | Status: DC
  Administered 2023-01-22 – 2023-01-25 (×4): 20 mg via ORAL
  Filled 2023-01-22 (×4): qty 2

## 2023-01-22 MED ORDER — SODIUM CHLORIDE 0.9% FLUSH
3.0000 mL | Freq: Two times a day (BID) | INTRAVENOUS | Status: DC
  Administered 2023-01-22 – 2023-01-25 (×7): 3 mL via INTRAVENOUS

## 2023-01-22 MED ORDER — INSULIN ASPART 100 UNIT/ML IJ SOLN
0.0000 [IU] | Freq: Three times a day (TID) | INTRAMUSCULAR | Status: DC
  Administered 2023-01-22: 2 [IU] via SUBCUTANEOUS

## 2023-01-22 MED ORDER — ACETAMINOPHEN 650 MG RE SUPP: 650.0000 mg | Freq: Four times a day (QID) | RECTAL | Status: DC | PRN

## 2023-01-22 NOTE — Evaluation (Signed)
Physical Therapy Evaluation Patient Details Name: Tyler Hahn MRN: [id number] DOB: December 29, 1940 Today's Date: 01/22/2023  History of Present Illness  Pt is an 82 y.o. male admitted 01/21/23 with BLE and abdominal swelling, unable to make it to HD appt. Workup for lactic acidosis, hypoglycemia. PMH includes ESRD on HD, CHF, AICD, HLD, gout, GERD, dementia.   Clinical Impression  Pt presents with an overall decrease in functional mobility secondary to above. PTA, pt lives at home with 24/7 assist from family and hired caregivers, pt ambulatory with RW and has assist for ADL/iADLs. Today, pt moving fairly well with up to minA to stand; BP 109/88, DOE 3/4 with activity on RA, unable to get reliable pulse ox reading during session. Pt would benefit from continued acute PT services to maximize functional mobility and independence prior to d/c with HHPT services. Pt's son present and supportive.       If plan is discharge home, recommend the following: A little help with walking and/or transfers;A little help with bathing/dressing/bathroom;Assistance with cooking/housework;Assist for transportation;Help with stairs or ramp for entrance;Direct supervision/assist for medications management;Direct supervision/assist for financial management   Can travel by private vehicle    Yes    Equipment Recommendations None recommended by PT  Recommendations for Other Services   Mobility Specialist   Functional Status Assessment Patient has had a recent decline in their functional status and demonstrates the ability to make significant improvements in function in a reasonable and predictable amount of time.     Precautions / Restrictions Precautions Precautions: Fall Restrictions Weight Bearing Restrictions: No      Mobility  Bed Mobility Overal bed mobility: Needs Assistance Bed Mobility: Supine to Sit     Supine to sit: Supervision     General bed mobility comments: cues to stay on task as pt  forgetful    Transfers Overall transfer level: Needs assistance Equipment used: Rolling walker (2 wheels) Transfers: Sit to/from Stand Sit to Stand: Min assist, Contact guard assist           General transfer comment: 2x stand from EOB with initial minA for trunk elevation and verbal cues for hand placement, increased time to complete task with frequent cues for sequencing; additional sit<>stand with min guard    Ambulation/Gait Ambulation/Gait assistance: Contact guard assist Gait Distance (Feet): 16 Feet Assistive device: Rolling walker (2 wheels) Gait Pattern/deviations: Step-to pattern, Step-through pattern, Decreased stride length Gait velocity: Decreased     General Gait Details: initial pre-gait activity marching in place and taking side steps at EOB, seated rest break; slow, mostly steady gait with RW and CGA for balance; 1x seated rest break after fatigue with standing tasks  Stairs            Wheelchair Mobility     Tilt Bed    Modified Rankin (Stroke Patients Only)       Balance Overall balance assessment: Needs assistance Sitting-balance support: No upper extremity supported, Feet supported Sitting balance-Leahy Scale: Fair Sitting balance - Comments: requires assist to don socks sitting EOB     Standing balance-Leahy Scale: Fair Standing balance comment: can static stand without UE support, reliant on RW to ambulate                             Pertinent Vitals/Pain Pain Assessment Pain Assessment: No/denies pain Pain Intervention(s): Monitored during session    Home Living Family/patient expects to be discharged to:: Private residence Living Arrangements:  Alone Available Help at Discharge: Family;Personal care attendant Type of Home: Apartment Home Access: Ramped entrance       Home Layout: One level Home Equipment: Agricultural consultant (2 wheels);Cane - single point;Tub bench;Grab bars - tub/shower;Electric scooter      Prior  Function Prior Level of Function : Needs assist             Mobility Comments: ambulatory with intermittent use of RW vs SPC; family helps pt stay as active as possible, has not needed w/c for community ambulation ADLs Comments: sits to shower, assisted for bathing, can typically dress himself, assist for IADLs     Extremity/Trunk Assessment   Upper Extremity Assessment Upper Extremity Assessment: Generalized weakness;Right hand dominate;RUE deficits/detail;LUE deficits/detail RUE Deficits / Details: arthritic/joint changes in hand RUE Coordination: decreased fine motor LUE Deficits / Details: arthritic/joint changes in hand LUE Coordination: decreased fine motor    Lower Extremity Assessment Lower Extremity Assessment: Generalized weakness;RLE deficits/detail;LLE deficits/detail RLE Deficits / Details: notable swelling BLEs resulting in decreased AROM and strength; functional strength >/ 3/5 LLE Deficits / Details: notable swelling BLEs resulting in decreased AROM and strength; functional strength >/ 3/5    Cervical / Trunk Assessment Cervical / Trunk Assessment: Kyphotic  Communication   Communication Communication: No apparent difficulties Cueing Techniques: Verbal cues  Cognition Arousal: Alert Behavior During Therapy: Flat affect Overall Cognitive Status: History of cognitive impairments - at baseline                                 General Comments: h/o dementia, requires increased cues to follow commands. son reports near baseline cognition        General Comments General comments (skin integrity, edema, etc.): pt's son present and supportive, able to verify pt's home set-up and PLOF; son reports having necessary assist and DME at home; reviewed educ re: activity recmomendations, fall risk reduction, importance of mobility. pt with DOE, unable to get reliable pulse ox reading during session, pt mobilizing on RA    Exercises     Assessment/Plan     PT Assessment Patient needs continued PT services  PT Problem List Decreased strength;Decreased activity tolerance;Decreased balance;Decreased mobility;Decreased cognition;Cardiopulmonary status limiting activity       PT Treatment Interventions DME instruction;Gait training;Functional mobility training;Therapeutic activities;Therapeutic exercise;Balance training;Patient/family education    PT Goals (Current goals can be found in the Care Plan section)  Acute Rehab PT Goals Patient Stated Goal: return home with continued family support PT Goal Formulation: With patient/family Time For Goal Achievement: 02/05/23 Potential to Achieve Goals: Good    Frequency Min 1X/week     Co-evaluation               AM-PAC PT "6 Clicks" Mobility  Outcome Measure Help needed turning from your back to your side while in a flat bed without using bedrails?: A Little Help needed moving from lying on your back to sitting on the side of a flat bed without using bedrails?: A Little Help needed moving to and from a bed to a chair (including a wheelchair)?: A Little Help needed standing up from a chair using your arms (e.g., wheelchair or bedside chair)?: A Little Help needed to walk in hospital room?: A Little Help needed climbing 3-5 steps with a railing? : A Little 6 Click Score: 18    End of Session Equipment Utilized During Treatment: Gait belt Activity Tolerance: Patient tolerated treatment well;Patient  limited by fatigue Patient left: Other (comment);with family/visitor present (with OT) Nurse Communication: Mobility status PT Visit Diagnosis: Other abnormalities of gait and mobility (R26.89);Muscle weakness (generalized) (M62.81)    Time: 1610-9604 PT Time Calculation (min) (ACUTE ONLY): 15 min   Charges:   PT Evaluation $PT Eval Moderate Complexity: 1 Mod   PT General Charges $$ ACUTE PT VISIT: 1 Visit        Ina Homes, PT, DPT Acute Rehabilitation Services  Personal:  [facility] Office: [phone]  Malachy Chamber 01/22/2023, 10:59 AM

## 2023-01-22 NOTE — Assessment & Plan Note (Signed)
Plt is 46 today, continue close monitoring.  Signs of poor prognosis.

## 2023-01-22 NOTE — Progress Notes (Signed)
  Progress Note   Patient: Tyler Hahn XBM:[id number] DOB: 1940/11/24 DOA: 01/21/2023     1 DOS: the patient was seen and examined on 01/22/2023   Brief hospital course: Tyler Hahn was admitted to the hospital with the working diagnosis of volume overload.   82 yo male with the past medical history of ESRD, heart failure, dyslipidemia, T2DM, dementia and hypothyroidism, who presented with edema. Patient was brought to the hospital due to worsening generalized edema. As outpatient not able to complete HD due to cramping and hypotension. On his initial physical examination his blood pressure was 113/73, HR 88, RR 18 and 02 saturation 99%, lungs with no wheezing but positive basilar rales, heart with S1 and S2 present and regular, abdomen with no distention and positive lower extremity edema.  Chest radiograph with cardiomegaly, bilateral hilar vascular congestion, pacemaker defibrillator in place with one right atrial and one right ventricular lead.      Assessment and Plan: * ESRD (end stage renal disease) (HCC) Plan for HD today, he has been placed on midodrine for blood pressure support.  Palliative care has been consulted, if patient does not tolerate HD, may need to concentrate of comfort care.   Lactic acidosis, up to 6,1. Probably type B acidosis. No sings of systemic infection.   Anemia of chronic renal disease.  Hgb is 11,8.   Acute on chronic systolic CHF (congestive heart failure) (HCC) Echocardiogram with reduced LV systolic function EF 25 to 30%, global hypokinesis, severe dilatation of LV cavity, interventricular septum is flattened in diastole. RV systolic function with severe reduction. Moderate TR,   Attempt ultrafiltration on HD today. Continue midodrine for blood pressure support.   Essential hypertension Continue blood pressure monitoring.  Continue with midodrine 5 mg po tid.   Hypothyroid Continue with levothyroxine.   Thrombocytopenia (HCC) Plt is 46  today, continue close monitoring.  Signs of poor prognosis.   Type 2 diabetes mellitus with hyperlipidemia (HCC) Hypoglycemia.   Follow up glucose is 238 Plan to hold on IV dextrose and hold on IV steroids.  Continue close follow up on capillary glucose. Hold on insulin therapy.         Subjective: Patient with no chest pain, mild confusion but not agitation. Poor oral intake   Physical Exam: Vitals:   01/22/23 0047 01/22/23 0458 01/22/23 0800 01/22/23 1210  BP: 116/64 110/66 103/63 121/70  Pulse: 64 71 80   Resp: 18 18 18 18   Temp:  97.6 F (36.4 C) 97.7 F (36.5 C) 97.7 F (36.5 C)  TempSrc: Oral Axillary Oral   SpO2:   97%   Weight:  87.7 kg    Height:       Neurology awake and alert. Follows commands and answers simple questions, positive confusion ENT with mild pallor Cardiovascular with S1 and S2 present and rhythmic with no gallops or murmurs Respiratory with no rales or wheezing, no rhonchi Abdomen with no distention  Data Reviewed:    Family Communication: no family at the bedside   Disposition: Status is: Inpatient Remains inpatient appropriate because: renal failure and heart failure   Planned Discharge Destination: Home    Author: Coralie Keens, MD 01/22/2023 1:37 PM  For on call review [email or web address].

## 2023-01-22 NOTE — Consult Note (Signed)
NAME:  Tyler Hahn, MRN:  [id number], DOB:  1940/10/20, LOS: 1 ADMISSION DATE:  01/21/2023, CONSULTATION DATE:  01/22/23 REFERRING MD:  Dr Adela Glimpse, CHIEF COMPLAINT: Hypotension, lactic acidosis  History of Present Illness:  Patient was brought into the hospital with leg and lower abdominal swelling End-stage renal disease patient on dialysis, Was unable to make it to dialysis today, and the last dialysis was cut short  Not feeling acutely ill, no fevers, no chills, no cough, may be a little bit more short of breath since developing leg swelling  End-stage renal disease, congestive heart failure, status post AICD, hyperlipidemia, gout, dementia, hypothyroidism, GERD, history of dementia  Blood pressure is usually borderline though according to history Oral intake has been poor the last couple of days Diabetes is listed as part of his diagnosis, not on any medications  Pertinent  Medical History   Past Medical History:  Diagnosis Date   Anxiety    Automatic implantable cardioverter-defibrillator in situ    greg taylor   CHF (congestive heart failure) (HCC)    2000   Diabetes mellitus    no meds   Fatty liver    Gout    "bout 2-3 months ago"-meds helped.   Hyperlipidemia    Hypertension    Hyperthyroidism    Kidney cysts    PONV (postoperative nausea and vomiting)    Renal insufficiency    Significant Hospital Events: Including procedures, antibiotic start and stop dates in addition to other pertinent events   Consult for critical care  Interim History / Subjective:  Awake alert interactive Denies any breathing issues right now Denies any pain or discomfort  Objective   Blood pressure 107/64, pulse 73, temperature (!) 97.4 F (36.3 C), temperature source Oral, resp. rate 18, height 5\' 7"  (1.702 m), weight 87.7 kg, SpO2 98%.       No intake or output data in the 24 hours ending 01/22/23 0024 Filed Weights   01/21/23 1059 01/21/23 2300  Weight: 85.4 kg 87.7 kg     Examination: General: Elderly, does not appear to be in distress HENT: Moist oral mucosa Lungs: Clear breath sounds bilaterally Cardiovascular: S1-S2 appreciated, bowel sounds appreciated Abdomen: Bowel sounds appreciated Extremities: Leg swelling bilaterally Neuro: Alert and interactive, moving all extremities GU:   I reviewed nursing notes, Consultant notes, hospitalist notes, last 24 h vitals and pain scores, last 48 h intake and output, last 24 h labs and trends, and last 24 h imaging results.  Resolved Hospital Problem list     Assessment & Plan:  End-stage renal disease -Has been unable to complete dialysis -Orders for dialysis placed  Lactic acidosis -Multifactorial -Related to end-stage renal disease -May have difficulty clearing lactic acid with hepatic congestion -Hypotension may have contributed -Has no symptoms suggesting sepsis -Does not look toxic, abdominal exam is benign -Metabolic acidosis may be contributing to it as well -He is not a candidate for aggressive fluid resuscitation because of his end-stage renal disease and coming in with leg swelling and scrotal edema-May be more significantly third spacing. -Receiving dialysis will help clear the lactate  Hypoglycemia -Decreased intake over the last 48 hours -Diet placed -Dose of glucagon given -Did receive D50 -Placed on D5 at 50 cc an hour  History of congestive heart failure  History of AICD placement -No recent echo -EF of 20 to 25% in 2014  Diabetes -Monitor sugar in the context of his hypoglycemia  Hypotension -Continue midodrine -Plan for albumin with dialysis  Carb modified diet  Echo in a.m.  Will continue to follow Does not need to be transferred to the ICU at present    Best Practice (right click and "Reselect all SmartList Selections" daily)   Diet/type: Regular consistency (see orders) DVT prophylaxis: SCD GI prophylaxis: N/A Lines: N/A Foley:  N/A Code Status:   DNR Last date of multidisciplinary goals of care discussion [discussed with family members at bedside]  Labs   CBC: Recent Labs  Lab 01/21/23 1500  WBC 7.3  NEUTROABS 5.8  HGB 12.2*  HCT 37.5*  MCV 94.2  PLT 59*    Basic Metabolic Panel: Recent Labs  Lab 01/21/23 1610 01/21/23 2046  NA 136  --   K 4.8  --   CL 95*  --   CO2 21*  --   GLUCOSE 115*  --   BUN 72*  --   CREATININE 8.14*  --   CALCIUM 8.9  --   PHOS  --  6.9*   GFR: Estimated Creatinine Clearance: 7.6 mL/min (A) (by C-G formula based on SCr of 8.14 mg/dL (H)). Recent Labs  Lab 01/21/23 1500 01/21/23 2056  WBC 7.3  --   LATICACIDVEN  --  6.0*    Liver Function Tests: Recent Labs  Lab 01/21/23 1610  AST 29  ALT 22  ALKPHOS 86  BILITOT 0.8  PROT 8.2*  ALBUMIN 3.3*   No results for input(s): "LIPASE", "AMYLASE" in the last 168 hours. No results for input(s): "AMMONIA" in the last 168 hours.  ABG    Component Value Date/Time   HCO3 23.5 01/21/2023 2122   ACIDBASEDEF 3.6 (H) 01/21/2023 2122   O2SAT 33.1 01/21/2023 2122     Coagulation Profile: Recent Labs  Lab 01/21/23 2046  INR 1.6*    Cardiac Enzymes: No results for input(s): "CKTOTAL", "CKMB", "CKMBINDEX", "TROPONINI" in the last 168 hours.  HbA1C: Hgb A1c MFr Bld  Date/Time Value Ref Range Status  06/05/2022 11:18 AM 7.1 (H) 4.6 - 6.5 % Final    Comment:    Glycemic Control Guidelines for People with Diabetes:Non Diabetic:  <6%Goal of Therapy: <7%Additional Action Suggested:  >8%   02/25/2022 10:51 AM 5.8 4.6 - 6.5 % Final    Comment:    Glycemic Control Guidelines for People with Diabetes:Non Diabetic:  <6%Goal of Therapy: <7%Additional Action Suggested:  >8%     CBG: Recent Labs  Lab 01/21/23 2046 01/21/23 2308 01/21/23 2315  GLUCAP 96 20* <10*    Review of Systems:   Leg swelling,  Past Medical History:  He,  has a past medical history of Anxiety, Automatic implantable cardioverter-defibrillator in situ, CHF  (congestive heart failure) (HCC), Diabetes mellitus, Fatty liver, Gout, Hyperlipidemia, Hypertension, Hyperthyroidism, Kidney cysts, PONV (postoperative nausea and vomiting), and Renal insufficiency.   Surgical History:   Past Surgical History:  Procedure Laterality Date   AV FISTULA PLACEMENT Right 08/23/2019   Procedure: ARTERIOVENOUS GORTEX GRAFT RIGHT ARM;  Surgeon: Larina Earthly, MD;  Location: [facility] OR;  Service: Vascular;  Laterality: Right;   CARDIAC DEFIBRILLATOR PLACEMENT     catract     COLONOSCOPY WITH PROPOFOL N/A 10/03/2015   Procedure: COLONOSCOPY WITH PROPOFOL;  Surgeon: Meryl Dare, MD;  Location: [facility] ENDOSCOPY;  Service: Endoscopy;  Laterality: N/A;   DIAGNOSTIC LAPAROSCOPY  01/27/2014   Dr Luisa Hart   ENTEROSCOPY N/A 11/25/2013   Procedure: ENTEROSCOPY;  Surgeon: Meryl Dare, MD;  Location: [facility] ENDOSCOPY;  Service: Endoscopy;  Laterality: N/A;   ICD  GENERATOR CHANGEOUT N/A 11/06/2020   Procedure: ICD GENERATOR CHANGEOUT;  Surgeon: Duke Salvia, MD;  Location: [facility] INVASIVE CV LAB;  Service: Cardiovascular;  Laterality: N/A;   ICD,[place] [employer]     LAPAROSCOPY N/A 01/27/2014   Procedure: LAPAROSCOPY DIAGNOSTIC;  Surgeon: Clovis Pu. Cornett, MD;  Location: [facility] OR;  Service: General;  Laterality: N/A;   polyp removal throat     difficulty speaking     Social History:   reports that he quit smoking about 24 years ago. His smoking use included cigarettes. He has never used smokeless tobacco. He reports that he does not drink alcohol and does not use drugs.   Family History:  His family history includes Alzheimer's disease in his mother; Diabetes in his father; Heart disease in his father; Kidney disease in his father; Liver disease in his father; Stomach cancer in his mother.   Allergies Allergies  Allergen Reactions   Sulfa Antibiotics Itching and Rash   Sulfonamide Derivatives Itching and Rash     Home Medications  Prior to Admission medications   Medication  Sig Start Date End Date Taking? Authorizing Provider  allopurinol (ZYLOPRIM) 100 MG tablet TAKE 1 TABLET BY MOUTH 3 TIMES A WEEK ON MONDAY, [facility] AND FRIDAY 11/25/22  Yes Sheliah Hatch, MD  atorvastatin (LIPITOR) 20 MG tablet Take 1 tablet (20 mg total) by mouth daily. 04/24/22  Yes Sheliah Hatch, MD  donepezil (ARICEPT) 10 MG tablet TAKE 1 TABLET BY MOUTH EVERYDAY AT BEDTIME 06/03/22  Yes Sheliah Hatch, MD  doxycycline (VIBRA-TABS) 100 MG tablet Take 1 tablet (100 mg total) by mouth 2 (two) times daily. 01/20/23  Yes Vivi Barrack, DPM  levothyroxine (SYNTHROID) 50 MCG tablet Take 1 tablet (50 mcg total) by mouth daily. 04/23/22  Yes Sheliah Hatch, MD  loratadine (CLARITIN) 10 MG tablet Take 1 tablet (10 mg total) by mouth daily. 02/25/22  Yes Sheliah Hatch, MD  midodrine (PROAMATINE) 10 MG tablet Take 10 mg by mouth once a week. 12/03/22  Yes [provider]  Multiple Vitamins-Minerals (MULTIVITAMIN WITH MINERALS) tablet Take 1 tablet by mouth daily.   Yes [provider]  mupirocin ointment (BACTROBAN) 2 % Apply 1 Application topically 2 (two) times daily. 01/20/23  Yes Vivi Barrack, DPM  omeprazole (PRILOSEC) 20 MG capsule Take 1 capsule (20 mg total) by mouth daily. 11/27/22  Yes Sheliah Hatch, MD  sertraline (ZOLOFT) 100 MG tablet TAKE 1.5 TABLETS (150MG  TOTAL) BY MOUTH DAILY 10/07/22  Yes Sheliah Hatch, MD  sevelamer carbonate (RENVELA) 800 MG tablet Take 800 mg by mouth 3 (three) times daily with meals. 01/09/23  Yes [provider]    Virl Diamond, MD [place] [facility] Pager: See Loretha Stapler

## 2023-01-22 NOTE — Consult Note (Signed)
   [facility] Inpatient Consult   01/22/2023  RJ STARZYNSKI April 09, 1941 [id number]  [facility] [[facility]]  Accountable Care Organization [ACO] Patient:  Monia Pouch Medicare  Primary Care Provider:  Sheliah Hatch, MD, [place] [facility] is listed to provide the transition of care follow up.  Patient screened for hospitalization with noted high risk score for unplanned readmission risk  and to assess for potential [facility]  [[facility]] Care Management service needs for post hospital transition for care coordination.  Review of patient's electronic medical record reveals patient is from home with an around the clock caregiver and family.   Patient rounds, patient was initially speaking with the chaplain came back and patient was sound asleep. Review for home with [facility], currently. No other needs assessed.   Plan:  Continue to follow progress and disposition to assess for post hospital community care coordination/management needs.  Referral request for community care coordination:  follow up with community TOC calls and appointment.  Of note, [facility] Care Management/Population Health does not replace or interfere with any arrangements made by the Inpatient Transition of Care team.  For questions contact:   Charlesetta Shanks, RN BSN CCM [facility]  [phone] business mobile phone Toll free office [phone]  *Concierge Line  [phone] Fax number: [phone] [email or web address]

## 2023-01-22 NOTE — Assessment & Plan Note (Addendum)
Hypoglycemia. Hyperglycemia.   Patient was placed on IV dextrose, then required low dose insulin.  At the time of his discharge his fasting glucose is 100 mg/dl.   Continue statin therapy.

## 2023-01-22 NOTE — Assessment & Plan Note (Addendum)
Echocardiogram with reduced LV systolic function EF 25 to 30%, global hypokinesis, severe dilatation of LV cavity, interventricular septum is flattened in diastole. RV systolic function with severe reduction. 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  Very poor prognosis, continue fluid management with ultrafiltration.   I spoke with his son over the phone and he requested defibrillator to be turned off.  Outpatient follow up with palliative care.

## 2023-01-22 NOTE — Consult Note (Signed)
Palliative Medicine Inpatient Consult Note  Consulting Provider: Therisa Doyne, MD   Reason for consult:   Palliative Care Consult Services Palliative Medicine Consult  Reason for Consult? goals of care   01/22/2023  HPI:  Per intake H&P --> Tyler Hahn is a 82 y.o. male with medical history significant of end-stage renal disease on hemodialysis Tuesday Thursday Saturday, DM2, HTN, CHF status post AICD, HLD fatty liver disease, gout, dementia, hypothyroidism, GERD . Has experienced worsening edema. Having a difficult time tolerating hemodialysis. The PMT was asked to support patient in discussing goals of care. \  Clinical Assessment/Goals of Care:  *Please note that this is a verbal dictation therefore any spelling or grammatical errors are due to the "Dragon Medical One" system interpretation.  I have reviewed medical records including EPIC notes, labs and imaging, received report from bedside RN, assessed the patient who is lying in bed in NAD.    I met with patient's son, Tyler Hahn to further discuss diagnosis prognosis, GOC, [name], disposition and options.   I introduced Palliative Medicine as specialized medical care for people living with serious illness. It focuses on providing relief from the symptoms and stress of a serious illness. The goal is to improve quality of life for both the patient and the family.  Medical History Review and Understanding:  A review of Tyler Hahn past medical history inclusive of dementia, type 2 diabetes, hypertension, congestive heart failure with AICD, hyperlipidemia, and end-stage renal disease was complete.  Social History:  Tyler Hahn lives in [place].  He is a widower.  He has 4 children Tyler Hahn, Tyler Hahn, and Tyler Hahn.  He has 12 grandchildren and 3 great-grandchildren.  He formerly worked as a Scientist, water quality at [employer].  He is a man of faith and practices within Christianity.  Functional and Nutritional  State:  Preceding hospitalization Tyler Hahn was living in an apartment he has a care aide with him, Tyler Hahn around-the-clock and when she is unable to be present his son Tyler Hahn is there.  Patient's family also take turns on the weekends caring for Tyler Hahn.  He is mobile and has both a cane and a walker but does not use them consistently.  He has suffered a few falls over the last year.  He has had a fairly decent appetite.  Advance Directives:  A detailed discussion was had today regarding advanced directives.  Tyler Hahn shares that he is Tyler Hahn power of attorney and he does have the documentation to support this in his car.  Code Status:  Concepts specific to code status, artifical feeding and hydration, continued IV antibiotics and rehospitalization was had.  The difference between a aggressive medical intervention path  and a palliative comfort care path for this patient at this time was had.   Tyler Hahn is an established DO NOT RESUSCITATE DO NOT INTUBATE CODE STATUS.  Discussion:  Tyler Hahn and I reviewed that Tyler Hahn has had a fairly difficult time recently in his dialysis center.  He has experienced episodes of not being able to complete treatments due to cramping.  Tyler Hahn and I reviewed best case and worst-case scenarios in the setting of this best case that we can correct any underlying issues and Tyler Hahn is able to tolerate dialysis moving forward.  If this is the case, Tyler Hahn expresses that they would want to do all that is able to support Tyler Hahn. He shares that he and his family have considered a memory care facility for him should his needs exceed their abilities  in the future. We talked about the Tyler Hahn speaking to him further regarding this.   Worst case is that Tyler Hahn continues to have an inability to tolerate dialysis treatments.  Tyler Hahn shares that he had a family meeting with his 3 siblings and if that were to be the case they all agree that they would want Tyler Hahn  to be comfortable.  I did discuss hospice care which Tyler Hahn is familiar with as it was utilized for his mother prior to her passing.  At this time Tyler Hahn and his family are hoping to allow time for outcomes.  Discussed the importance of continued conversation with family and their  medical providers regarding overall plan of care and treatment options, ensuring decisions are within the context of the patients values and GOCs.  Decision Maker: Tyler Hahn, Tyler Hahn (Son): [phone] (Home Phone)   SUMMARY OF RECOMMENDATIONS   DNAR/DNI  Open and honest conversations held in the setting of patients recent inability to complete dialysis treatments  Appreciate Tyler Hahn speaking to patients son regarding memory care facilities  Ongoing support  Code Status/Advance Care Planning: DNAR/DNI  Palliative Prophylaxis:  Aspiration, Bowel Regimen, Delirium Protocol, Frequent Pain Assessment, Oral Care, Palliative Wound Care, and Turn Reposition  Additional Recommendations (Limitations, Scope, Preferences): Continue current care  Psycho-social/Spiritual:  Desire for further Chaplaincy support: Not presently Additional Recommendations: Education on effects of iHD intolerance   Prognosis: Will depend on hospitalization, has multiple co-morbidities, has frailty. Increased 12 month mortality risk.   Discharge Planning: Discharge plan uncertain at this time.  Vitals:   01/22/23 0047 01/22/23 0458  BP: 116/64 110/66  Pulse: 64 71  Resp: 18 18  Temp:  97.6 F (36.4 C)  SpO2:     No intake or output data in the 24 hours ending 01/22/23 0643 Last Weight  Most recent update: 01/22/2023  5:05 AM    Weight  87.7 kg (193 lb 5.5 oz)            Gen:  Elderly AA M in NAD HEENT: moist mucous membranes CV: Regular rate and rhythm  PULM:  Breathing comfortably on RA ABD: soft/nontender  EXT: No edema  Neuro: Sleeping  PPS: 50%   This conversation/these recommendations were discussed with  patient primary care team, Dr. Ella Jubilee  Billing based on MDM: High  Problems Addressed: One acute or chronic illness or injury that poses a threat to life or bodily function  Amount and/or Complexity of Data: Category 3:Discussion of management or test interpretation with external physician/other qualified health care professional/appropriate source (not separately reported)  Risks: Decision regarding hospitalization or escalation of hospital care ______________________________________________________ [name] [facility] Palliative Medicine Team Team Cell Phone: [phone] Please utilize secure chat with additional questions, if there is no response within 30 minutes please call the above phone number  Palliative Medicine Team providers are available by phone from 7am to 7pm daily and can be reached through the team cell phone.  Should this patient require assistance outside of these hours, please call the patient's attending physician.

## 2023-01-22 NOTE — Progress Notes (Signed)
Heart Failure Navigator Progress Note  Assessed for Heart & Vascular TOC clinic readiness.  Patient does not meet criteria due to ESRD on hemodialysis.   Navigator will sign off at this time.     , BSN, RN Heart Failure Nurse Navigator Secure Chat Only   

## 2023-01-22 NOTE — Assessment & Plan Note (Signed)
Continue blood pressure monitoring.  Continue with midodrine 5 mg po tid.

## 2023-01-22 NOTE — Evaluation (Signed)
Occupational Therapy Evaluation Patient Details Name: Tyler Hahn MRN: [id number] DOB: Jul 15, 1940 Today's Date: 01/22/2023   History of Present Illness Pt is an 82 y.o. male admitted 01/21/23 with BLE and abdominal swelling, unable to make it to HD appt. Workup for lactic acidosis, hypoglycemia. PMH includes ESRD on HD, CHF, AICD, HLD, gout, GERD, dementia.   Clinical Impression   Pt typically walks with a walker and is assisted for showering and IADLs by his family or caregivers. He was able to dress, groom, toilet and feed himself. Pt presents with baseline cognitive impairment, generalized weakness, decreased activity tolerance and imbalance in standing requiring min guard assist with RW for ambulation in during standing grooming at sink. He is dependent in LB ADLs, with edema appearing to contribute to pt's difficulty reaching his feet. Pt is likely to progress well and has excellent support at home. Will recommend HHOT.       If plan is discharge home, recommend the following: A little help with walking and/or transfers;A lot of help with bathing/dressing/bathroom;Assistance with cooking/housework;Direct supervision/assist for medications management;Direct supervision/assist for financial management;Assist for transportation;Help with stairs or ramp for entrance    Functional Status Assessment  Patient has had a recent decline in their functional status and demonstrates the ability to make significant improvements in function in a reasonable and predictable amount of time.  Equipment Recommendations  None recommended by OT    Recommendations for Other Services       Precautions / Restrictions Precautions Precautions: Fall Restrictions Weight Bearing Restrictions: No      Mobility Bed Mobility Overal bed mobility: Needs Assistance Bed Mobility: Sit to Supine       Sit to supine: Min assist   General bed mobility comments: light min assist for LEs back into bed     Transfers Overall transfer level: Needs assistance Equipment used: Rolling walker (2 wheels) Transfers: Sit to/from Stand Sit to Stand: Min assist           General transfer comment: assist to rise, cues for hand placement, pt typically needs min assist to stand at home      Balance Overall balance assessment: Needs assistance   Sitting balance-Leahy Scale: Fair       Standing balance-Leahy Scale: Poor Standing balance comment: fair statically at sink, reliant on B UE support for ambulation                           ADL either performed or assessed with clinical judgement   ADL Overall ADL's : Needs assistance/impaired Eating/Feeding: Set up;Sitting   Grooming: Wash/dry hands;Standing;Minimal assistance   Upper Body Bathing: Minimal assistance;Sitting   Lower Body Bathing: Moderate assistance;Sit to/from stand   Upper Body Dressing : Minimal assistance;Sitting   Lower Body Dressing: Maximal assistance;Sit to/from stand   Toilet Transfer: Contact guard assist;Ambulation;Rolling walker (2 wheels)           Functional mobility during ADLs: Contact guard assist;Rolling walker (2 wheels)       Vision Baseline Vision/History: 1 Wears glasses Ability to See in Adequate Light: 0 Adequate Patient Visual Report: No change from baseline       Perception         Praxis         Pertinent Vitals/Pain Pain Assessment Pain Assessment: Faces Faces Pain Scale: No hurt     Extremity/Trunk Assessment Upper Extremity Assessment Upper Extremity Assessment: Right hand dominate;RUE deficits/detail;LUE deficits/detail RUE Deficits / Details:  arthritic/joint changes in hand RUE Coordination: decreased fine motor LUE Deficits / Details: arthritic/joint changes in hand LUE Coordination: decreased fine motor   Lower Extremity Assessment Lower Extremity Assessment: Defer to PT evaluation   Cervical / Trunk Assessment Cervical / Trunk Assessment:  Kyphotic   Communication Communication Cueing Techniques: Verbal cues   Cognition Arousal: Alert Behavior During Therapy: Flat affect Overall Cognitive Status: History of cognitive impairments - at baseline                                       General Comments       Exercises     Shoulder Instructions      Home Living Family/patient expects to be discharged to:: Private residence Living Arrangements: Alone Available Help at Discharge: Family;Personal care attendant Type of Home: Apartment Home Access: Ramped entrance     Home Layout: One level     Bathroom Shower/Tub: Chief Strategy Officer: Standard     Home Equipment: Agricultural consultant (2 wheels);Cane - single point;Tub bench;Grab bars - tub/shower;Electric scooter          Prior Functioning/Environment Prior Level of Function : Needs assist             Mobility Comments: walks with RW ADLs Comments: sits to shower, assisted for bathing, can typically dress himself, assist for IADLs        OT Problem List: Decreased strength;Decreased activity tolerance;Impaired balance (sitting and/or standing);Decreased cognition;Impaired UE functional use      OT Treatment/Interventions: Self-care/ADL training;DME and/or AE instruction;Therapeutic activities;Patient/family education;Balance training    OT Goals(Current goals can be found in the care plan section) Acute Rehab OT Goals OT Goal Formulation: With patient/family Time For Goal Achievement: 02/05/23 Potential to Achieve Goals: Good ADL Goals Pt Will Perform Grooming: with supervision;standing (2 activities) Pt Will Perform Lower Body Bathing: with supervision;sit to/from stand Pt Will Perform Lower Body Dressing: with supervision;sit to/from stand Pt Will Transfer to Toilet: with supervision;ambulating;regular height toilet Pt Will Perform Toileting - Clothing Manipulation and hygiene: with supervision;sit to/from stand  OT  Frequency: Min 1X/week    Co-evaluation              AM-PAC OT "6 Clicks" Daily Activity     Outcome Measure Help from another person eating meals?: A Little Help from another person taking care of personal grooming?: A Little Help from another person toileting, which includes using toliet, bedpan, or urinal?: A Lot Help from another person bathing (including washing, rinsing, drying)?: A Lot Help from another person to put on and taking off regular upper body clothing?: A Little Help from another person to put on and taking off regular lower body clothing?: A Lot 6 Click Score: 15   End of Session Equipment Utilized During Treatment: Gait belt;Rolling walker (2 wheels) Nurse Communication: Mobility status  Activity Tolerance: Patient tolerated treatment well Patient left: in bed;with call bell/phone within reach;with family/visitor present;with bed alarm set  OT Visit Diagnosis: Unsteadiness on feet (R26.81);Other abnormalities of gait and mobility (R26.89);Muscle weakness (generalized) (M62.81);Other symptoms and signs involving cognitive function                Time: 0865-7846 OT Time Calculation (min): 11 min Charges:  OT General Charges $OT Visit: 1 Visit OT Evaluation $OT Eval Moderate Complexity: 1 Mod  Berna Spare, OTR/L Acute Rehabilitation Services Office: [phone]   Martie Round  Larita Fife 01/22/2023, 10:47 AM

## 2023-01-22 NOTE — Hospital Course (Addendum)
Tyler Hahn was admitted to the hospital with the working diagnosis of volume overload.   82 yo male with the past medical history of ESRD (TTS), heart failure, dyslipidemia, T2DM, dementia and hypothyroidism, who presented with edema. Patient was brought to the hospital due to worsening generalized edema. As outpatient not able to complete HD due to cramping and hypotension. On his initial physical examination his blood pressure was 113/73, HR 88, RR 18 and 02 saturation 99%, lungs with no wheezing but positive basilar rales, heart with S1 and S2 present and regular, abdomen with no distention and positive lower extremity edema.  Na 136, K 4,8 Cl 95, bicarbonate 21, glucose 115, bun 72, cr 8,14 Anion gap 20.  AST 29 ALT 22 Wbc 7,3 hgb 12.2 plt 59   Chest radiograph with cardiomegaly, bilateral hilar vascular congestion, pacemaker defibrillator in place with one right atrial and one right ventricular lead.    CT chest with no evidence of central pulmonary embolism, limited evaluation of segmental and subsegmental pulmonary arteries due to motion artifact. Bilateral interstitial infiltrates, small right pleural effusion. Solid pulmonary nodule right middle lobe 13 mm (consider additional 6-12 mo follow up to ensure stability).   EKG 79 bpm, normal axis, qtc 550, sinus rhythm with first degree AV block, poor R R wave progression with no significant ST segment or T wave changes.   08/08 patient has been placed on midodrine to support blood pressure. Inpatient renal replacement therapy for ultrafiltration.  08/09 patient tolerated well 2 days of consecutive HD, plan to discharge home and continue midodrine for blood pressure support.

## 2023-01-22 NOTE — Assessment & Plan Note (Addendum)
Volume overload. Hypokalemia  Patient was placed on midodrine for blood pressure support and underwent hemodialysis with ultrafiltration for 2 consecutive days.  Negative fluid balance was achieved with good toleration.   Very poor prognosis in the setting of advanced heart failure and ESRD on HD. Palliative care has been consulted, and his son requested ICD capability to be turned off.  At the time of his discharge his BUN is 32 from 56, his K is 3.3 and serum bicarbonate at 27.  Na 135.   Lactic acidosis, up to 6,1. Probably type B acidosis. No sings of systemic infection.  Sepsis was ruled out.   Anemia of chronic renal disease.  Hgb is 11,8.   Metabolic bone disease, continue with cinacalcet and calcitriol.

## 2023-01-22 NOTE — Progress Notes (Signed)
NAME:  Tyler Hahn, MRN:  [id number], DOB:  1941/04/02, LOS: 1 ADMISSION DATE:  01/21/2023, CONSULTATION DATE:  01/22/23 REFERRING MD:  Dr Adela Glimpse, CHIEF COMPLAINT: Hypotension, lactic acidosis  History of Present Illness:  Patient was brought into the hospital with leg and lower abdominal swelling End-stage renal disease patient on dialysis, Was unable to make it to dialysis today, and the last dialysis was cut short  Not feeling acutely ill, no fevers, no chills, no cough, may be a little bit more short of breath since developing leg swelling  End-stage renal disease, congestive heart failure, status post AICD, hyperlipidemia, gout, dementia, hypothyroidism, GERD, history of dementia  Blood pressure is usually borderline though according to history Oral intake has been poor the last couple of days Diabetes is listed as part of his diagnosis, not on any medications  Pertinent  Medical History   Past Medical History:  Diagnosis Date   Anxiety    Automatic implantable cardioverter-defibrillator in situ    greg taylor   CHF (congestive heart failure) (HCC)    2000   Diabetes mellitus    no meds   Fatty liver    Gout    "bout 2-3 months ago"-meds helped.   Hyperlipidemia    Hypertension    Hyperthyroidism    Kidney cysts    PONV (postoperative nausea and vomiting)    Renal insufficiency    Significant Hospital Events: Including procedures, antibiotic start and stop dates in addition to other pertinent events   Consult for critical care  Interim History / Subjective:  Hemodynamically stable  Objective   Blood pressure 103/63, pulse 71, temperature 97.7 F (36.5 C), temperature source Oral, resp. rate 18, height 5\' 7"  (1.702 m), weight 87.7 kg, SpO2 97%.       No intake or output data in the 24 hours ending 01/22/23 1009 Filed Weights   01/21/23 1059 01/21/23 2300 01/22/23 0458  Weight: 85.4 kg 87.7 kg 87.7 kg    Examination: Male no acute distress No JVD  lymphadenopathy is appreciated Breath sounds diminished in the bases Heart sounds are regular Abdomen unremarkable positive bowel sounds Neurologically intact       Resolved Hospital Problem list     Assessment & Plan:  End-stage renal disease Orders for dialysis have been placed  Lactic acidosis most likely multifactorial related to end-stage renal disease and his lactic acid has decreased.  Most likely dialysis will improve this remarkably Dialysis Monitor lactic acid Blood pressure is improved Not a candidate for aggressive fluid resuscitation due to end-stage renal disease     Hypoglycemia CBG (last 3)  Recent Labs    01/22/23 0702 01/22/23 0956 01/22/23 1002  GLUCAP 205* 222* 238*    Resolved   History of congestive heart failure  History of AICD placement Repeat echo has been ordered  Diabetes Sliding scale insulin protocol  Hypotension Continue midodrine Albumin replacement  Pulmonary critical care will be available as needed     Best Practice (right click and "Reselect all SmartList Selections" daily)   Diet/type: Regular consistency (see orders) DVT prophylaxis: SCD GI prophylaxis: N/A Lines: N/A Foley:  N/A Code Status:  DNR Last date of multidisciplinary goals of care discussion [discussed with family members at bedside]  Labs   CBC: Recent Labs  Lab 01/21/23 1500 01/22/23 0634  WBC 7.3 5.6  NEUTROABS 5.8 5.1  HGB 12.2* 11.8*  HCT 37.5* 35.5*  MCV 94.2 89.4  PLT 59* 46*    Basic Metabolic  Panel: Recent Labs  Lab 01/21/23 1610 01/21/23 2046 01/22/23 0637  NA 136  --  135  K 4.8  --  4.8  CL 95*  --  90*  CO2 21*  --  17*  GLUCOSE 115*  --  212*  BUN 72*  --  74*  CREATININE 8.14*  --  8.78*  CALCIUM 8.9  --  8.5*  MG  --   --  2.0  PHOS  --  6.9* 6.7*   GFR: Estimated Creatinine Clearance: 7.1 mL/min (A) (by C-G formula based on SCr of 8.78 mg/dL (H)). Recent Labs  Lab 01/21/23 1500 01/21/23 2056  01/22/23 0634  WBC 7.3  --  5.6  LATICACIDVEN  --  6.0* 4.9*    Liver Function Tests: Recent Labs  Lab 01/21/23 1610 01/22/23 0637  AST 29 23  ALT 22 18  ALKPHOS 86 75  BILITOT 0.8 0.8  PROT 8.2* 7.6  ALBUMIN 3.3* 3.0*   No results for input(s): "LIPASE", "AMYLASE" in the last 168 hours. No results for input(s): "AMMONIA" in the last 168 hours.  ABG    Component Value Date/Time   HCO3 23.5 01/21/2023 2122   ACIDBASEDEF 3.6 (H) 01/21/2023 2122   O2SAT 33.1 01/21/2023 2122     Coagulation Profile: Recent Labs  Lab 01/21/23 2046  INR 1.6*    Cardiac Enzymes: No results for input(s): "CKTOTAL", "CKMB", "CKMBINDEX", "TROPONINI" in the last 168 hours.  HbA1C: Hgb A1c MFr Bld  Date/Time Value Ref Range Status  01/21/2023 08:05 PM 7.1 (H) 4.8 - 5.6 % Final    Comment:    (NOTE) Pre diabetes:          5.7%-6.4%  Diabetes:              >6.4%  Glycemic control for   <7.0% adults with diabetes   06/05/2022 11:18 AM 7.1 (H) 4.6 - 6.5 % Final    Comment:    Glycemic Control Guidelines for People with Diabetes:Non Diabetic:  <6%Goal of Therapy: <7%Additional Action Suggested:  >8%     CBG: Recent Labs  Lab 01/22/23 0251 01/22/23 0504 01/22/23 0702 01/22/23 0956 01/22/23 1002  GLUCAP 126* 183* 205* 222* 238*      Steve  ACNP Acute Care Nurse Practitioner [facility]/Critical Care Please consult Amion 01/22/2023, 10:09 AM

## 2023-01-22 NOTE — Consult Note (Signed)
Renal Service Consult Note [facility]  Tyler Hahn 01/22/2023 Tyler Krabbe, MD Requesting Physician: Tyler. Ella Hahn  Reason for Consult: ESRD pt w/ vol overload and chronic hypotension HPI: The patient is a 82 y.o. year-old w/ PMH as below who presented to ED for worsening edema in the legs and scrotum. Hx of CHF/ AICD, on dialysis for 3 yrs. H/o dementia. We are asked to see for dialysis.   Son gives hx , pt does not speak much. Pt has been having troubles at OP dialysis w/ low bp's and cramping and his legs and scrotum have been getting more and more swollen over the last 2-3 weeks. At baseline pt is able to walk at home and feeds himself. Has sig dementia.     ROS - denies CP, no joint pain, no HA, no blurry vision, no rash, no diarrhea, no nausea/ vomiting   Past Medical History  Past Medical History:  Diagnosis Date   Anxiety    Automatic implantable cardioverter-defibrillator in situ    Tyler Hahn   CHF (congestive heart failure) (HCC)    2000   Diabetes mellitus    no meds   Fatty liver    Gout    "bout 2-3 months ago"-meds helped.   Hyperlipidemia    Hypertension    Hyperthyroidism    Kidney cysts    PONV (postoperative nausea and vomiting)    Renal insufficiency    Past Surgical History  Past Surgical History:  Procedure Laterality Date   AV FISTULA PLACEMENT Right 08/23/2019   Procedure: ARTERIOVENOUS GORTEX GRAFT RIGHT ARM;  Surgeon: Tyler Earthly, MD;  Location: [facility] OR;  Service: Vascular;  Laterality: Right;   CARDIAC DEFIBRILLATOR PLACEMENT     catract     COLONOSCOPY WITH PROPOFOL N/A 10/03/2015   Procedure: COLONOSCOPY WITH PROPOFOL;  Surgeon: Tyler Dare, MD;  Location: [facility] ENDOSCOPY;  Service: Endoscopy;  Laterality: N/A;   DIAGNOSTIC LAPAROSCOPY  01/27/2014   Tyler Hahn   ENTEROSCOPY N/A 11/25/2013   Procedure: ENTEROSCOPY;  Surgeon: Tyler Dare, MD;  Location: [facility] ENDOSCOPY;  Service: Endoscopy;  Laterality: N/A;   ICD  GENERATOR CHANGEOUT N/A 11/06/2020   Procedure: ICD GENERATOR CHANGEOUT;  Surgeon: Tyler Salvia, MD;  Location: [facility] INVASIVE CV LAB;  Service: Cardiovascular;  Laterality: N/A;   ICD,[place] [employer]     LAPAROSCOPY N/A 01/27/2014   Procedure: LAPAROSCOPY DIAGNOSTIC;  Surgeon: Tyler Pu. Cornett, MD;  Location: [facility] OR;  Service: General;  Laterality: N/A;   polyp removal throat     difficulty speaking   Family History  Family History  Problem Relation Age of Onset   Stomach cancer Mother    Alzheimer's disease Mother    Diabetes Father    Heart disease Father    Liver disease Father    Kidney disease Father    Social History  reports that he quit smoking about 24 years ago. His smoking use included cigarettes. He has never used smokeless tobacco. He reports that he does not drink alcohol and does not use drugs. Allergies  Allergies  Allergen Reactions   Sulfa Antibiotics Itching and Rash   Sulfonamide Derivatives Itching and Rash   Home medications Prior to Admission medications   Medication Sig Start Date End Date Taking? Authorizing Provider  allopurinol (ZYLOPRIM) 100 MG tablet TAKE 1 TABLET BY MOUTH 3 TIMES A WEEK ON MONDAY, [facility] AND FRIDAY 11/25/22  Yes Tyler Hatch, MD  atorvastatin (LIPITOR) 20 MG tablet  Take 1 tablet (20 mg total) by mouth daily. 04/24/22  Yes Tyler Hatch, MD  donepezil (ARICEPT) 10 MG tablet TAKE 1 TABLET BY MOUTH EVERYDAY AT BEDTIME 06/03/22  Yes Tyler Hatch, MD  doxycycline (VIBRA-TABS) 100 MG tablet Take 1 tablet (100 mg total) by mouth 2 (two) times daily. 01/20/23  Yes Tyler Hahn, DPM  levothyroxine (SYNTHROID) 50 MCG tablet Take 1 tablet (50 mcg total) by mouth daily. 04/23/22  Yes Tyler Hatch, MD  loratadine (CLARITIN) 10 MG tablet Take 1 tablet (10 mg total) by mouth daily. 02/25/22  Yes Tyler Hatch, MD  midodrine (PROAMATINE) 10 MG tablet Take 10 mg by mouth once a week. 12/03/22  Yes [provider]  Multiple Vitamins-Minerals (MULTIVITAMIN WITH MINERALS) tablet Take 1 tablet by mouth daily.   Yes [provider]  mupirocin ointment (BACTROBAN) 2 % Apply 1 Application topically 2 (two) times daily. 01/20/23  Yes Tyler Hahn, DPM  omeprazole (PRILOSEC) 20 MG capsule Take 1 capsule (20 mg total) by mouth daily. 11/27/22  Yes Tyler Hatch, MD  sertraline (ZOLOFT) 100 MG tablet TAKE 1.5 TABLETS (150MG  TOTAL) BY MOUTH DAILY 10/07/22  Yes Tyler Hatch, MD  sevelamer carbonate (RENVELA) 800 MG tablet Take 800 mg by mouth 3 (three) times daily with meals. 01/09/23  Yes [provider]     Vitals:   01/21/23 2300 01/22/23 0047 01/22/23 0458 01/22/23 0800  BP:  116/64 110/66 103/63  Pulse:  64 71   Resp:  18 18   Temp:   97.6 F (36.4 C) 97.7 F (36.5 C)  TempSrc:  Oral Axillary Oral  SpO2:    97%  Weight: 87.7 kg  87.7 kg   Height:       Exam Gen lethargic, awakens to voice, answers simple questions On room air Sclera anicteric, throat clear  No jvd or bruits Chest clear bilat to bases, no rales/ wheezing RRR no MRG Abd soft ntnd no mass or ascites +bs GU defer MS no joint effusions or deformity Ext 2+ diffuse bilat LE edema Neuro is alert, Ox 3 , nf    RUA AVG+bruit      Home meds include - midodrine 10mg , renvela 800 tid, allopurinol, atrovastatin, donepezil, synthroid, loratadine, MVI, omeprazole, sertraline     OP HD: TTS NW  3.5h   400/1.5  81.5kg  2/2.5 bath  AVG   Heparin 4000 - last OP HD 8/03, post wt 86.5kg - coming off 3-6kg over dry wt for last 2 wks - rocaltrol 1.5 mcg po three times per week - sensipar 60mg  po three times per week    Assessment/ Plan: Volume overload / chronic hypotension - pt w/ long hx CHF, chronic hypotension now w/ difficulty getting fluid off at outpatient dialysis. Will write for midodrine (at 10 tid) tid and hopefully this can help.  With sig CHF/ BP issues it is possible that good  volume control will not be attainable.  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 Short HD today, if staffing available, then HD tomorrow. Get volume down as tolerated.  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 Cont po vdra and sensipar three times per week. Cont renvela as binder.  Dementia  DNR       Vinson Moselle  MD [facility] 01/22/2023, 11:08 AM  Recent Labs  Lab 01/21/23 1500 01/21/23 1610 01/21/23 2046 01/22/23 0634 01/22/23 0637  HGB  12.2*  --   --  11.8*  --   ALBUMIN  --  3.3*  --   --  3.0*  CALCIUM  --  8.9  --   --  8.5*  PHOS  --   --  6.9*  --  6.7*  CREATININE  --  8.14*  --   --  8.78*  K  --  4.8  --   --  4.8   Inpatient medications:  atorvastatin  20 mg Oral Daily   Chlorhexidine Gluconate Cloth  6 each Topical Q0600   donepezil  10 mg Oral QHS   doxycycline  100 mg Oral BID   hydrocortisone sod succinate (SOLU-CORTEF) inj  100 mg Intravenous Q8H   levothyroxine  50 mcg Oral Daily   loratadine  10 mg Oral Daily   midodrine  5 mg Oral TID WC   mupirocin ointment  1 Application Topical BID   pantoprazole  40 mg Oral Daily   sertraline  100 mg Oral Daily   sodium chloride flush  3 mL Intravenous Q12H    sodium chloride     sodium chloride, acetaminophen **OR** acetaminophen, glucagon (human recombinant), sodium chloride flush

## 2023-01-22 NOTE — Inpatient Diabetes Management (Signed)
Inpatient Diabetes Program Recommendations  AACE/ADA: New Consensus Statement on Inpatient Glycemic Control (2015)  Target Ranges:  Prepandial:   less than 140 mg/dL      Peak postprandial:   less than 180 mg/dL (1-2 hours)      Critically ill patients:  140 - 180 mg/dL    Latest Reference Range & Units 01/21/23 20:46 01/21/23 23:08 01/21/23 23:15 01/21/23 23:46 01/22/23 00:31  Glucose-Capillary 70 - 99 mg/dL 96 20 (LL) <16 (LL) 44 (LL)  50 ml D50% 132 (H)  (LL): Data is critically low (H): Data is abnormally high  Latest Reference Range & Units 01/22/23 02:51 01/22/23 05:04 01/22/23 07:02  Glucose-Capillary 70 - 99 mg/dL 109 (H) 604 (H) 540 (H)  (H): Data is abnormally high   Admit with: Fluid Overload/ Lactic Acidosis/ Hypoglycemia  History: DM2, ESRD, Dementia  Home DM Meds: None Listed--Diet Controlled  Current Orders: None yet--Checking CBGs Q2 hours     MD- Note Hypoglycemia on admission.  Now getting Soucortef 100 mg Q8H.  CBG 205 at 7am today.  May consider starting Novolog 0-6 units Q4 hours (Very Sensitive scale SSI)     --Will follow patient during hospitalization--  Ambrose Finland RN, MSN, CDCES Diabetes Coordinator Inpatient Glycemic Control Team Team Pager: [phone] (8a-5p)

## 2023-01-22 NOTE — Progress Notes (Signed)
  Echocardiogram 2D Echocardiogram has been performed.  Leda Roys RDCS 01/22/2023, 11:21 AM

## 2023-01-22 NOTE — Assessment & Plan Note (Signed)
Continue with levothyroxine  

## 2023-01-22 NOTE — Progress Notes (Signed)
This chaplain responded to the RN's consult for spiritual care in the setting of prayer. Family is not at the bedside.   The chaplain's interaction with the Pt. is limited to the Pt. answering the chaplain's questions. The chaplain understands the Pt. family includes grandchildren and great grands with whom he enjoys "time" with.   The Pt. accepts the chaplain's invitation for prayer and appreciates the chaplain's visit.  Chaplain Stephanie Acre [phone]

## 2023-01-23 ENCOUNTER — Ambulatory Visit (HOSPITAL_COMMUNITY): Payer: Medicare HMO

## 2023-01-23 DIAGNOSIS — E039 Hypothyroidism, unspecified: Secondary | ICD-10-CM | POA: Diagnosis not present

## 2023-01-23 DIAGNOSIS — I10 Essential (primary) hypertension: Secondary | ICD-10-CM | POA: Diagnosis not present

## 2023-01-23 DIAGNOSIS — N186 End stage renal disease: Secondary | ICD-10-CM | POA: Diagnosis not present

## 2023-01-23 DIAGNOSIS — I5023 Acute on chronic systolic (congestive) heart failure: Secondary | ICD-10-CM | POA: Diagnosis not present

## 2023-01-23 LAB — GLUCOSE, CAPILLARY
Glucose-Capillary: 81 mg/dL (ref 70–99)
Glucose-Capillary: 74 mg/dL (ref 70–99)
Glucose-Capillary: 154 mg/dL — ABNORMAL HIGH (ref 70–99)
Glucose-Capillary: 165 mg/dL — ABNORMAL HIGH (ref 70–99)
Glucose-Capillary: 184 mg/dL — ABNORMAL HIGH (ref 70–99)

## 2023-01-23 MED ORDER — SEVELAMER CARBONATE 800 MG PO TABS
800.0000 mg | ORAL_TABLET | Freq: Three times a day (TID) | ORAL | Status: DC
  Administered 2023-01-23 – 2023-01-25 (×8): 800 mg via ORAL
  Filled 2023-01-23 (×8): qty 1

## 2023-01-23 MED ORDER — ONDANSETRON 4 MG PO TBDP
4.0000 mg | ORAL_TABLET | Freq: Three times a day (TID) | ORAL | Status: DC | PRN
  Administered 2023-01-23: 4 mg via ORAL
  Filled 2023-01-23: qty 1

## 2023-01-23 MED ORDER — HEPARIN SODIUM (PORCINE) 1000 UNIT/ML DIALYSIS
4000.0000 [IU] | Freq: Once | INTRAMUSCULAR | Status: DC
  Filled 2023-01-23: qty 4

## 2023-01-23 MED ORDER — INSULIN ASPART 100 UNIT/ML IJ SOLN
0.0000 [IU] | Freq: Three times a day (TID) | INTRAMUSCULAR | Status: DC
  Administered 2023-01-25: 2 [IU] via SUBCUTANEOUS

## 2023-01-23 MED ORDER — CHLORHEXIDINE GLUCONATE CLOTH 2 % EX PADS: 6.0000 | MEDICATED_PAD | Freq: Every day | CUTANEOUS | Status: DC

## 2023-01-23 NOTE — Progress Notes (Signed)
Occupational Therapy Treatment Patient Details Name: Tyler Hahn MRN: [id number] DOB: [birth year] Today's Date: 01/23/2023   History of present illness Pt is an 82 y.o. male admitted 01/21/23 with BLE and abdominal swelling, unable to make it to HD appt. Workup for lactic acidosis, hypoglycemia. PMH includes ESRD on HD, CHF, AICD, HLD, gout, GERD, dementia.   OT comments  Pt ambulated with RW from bed to sink for one activity in grooming with CGA. Ambulated to chair on opposite side of room and completed a second grooming activity in sitting. Pt demonstrated ability to doff socks, but needing moderate assist to don. Verbal cues needed to monitor fatigue. Pt cooperative and grateful to be up in chair with warm blankets and warm pack for his cold hands. Continue to recommend HHOT.      If plan is discharge home, recommend the following:  A little help with walking and/or transfers;A lot of help with bathing/dressing/bathroom;Assistance with cooking/housework;Direct supervision/assist for medications management;Direct supervision/assist for financial management;Assist for transportation;Help with stairs or ramp for entrance   Equipment Recommendations  None recommended by OT    Recommendations for Other Services      Precautions / Restrictions Precautions Precautions: Fall Restrictions Weight Bearing Restrictions: No       Mobility Bed Mobility Overal bed mobility: Needs Assistance Bed Mobility: Supine to Sit     Supine to sit: Supervision     General bed mobility comments: increased time, cues to scoot hips to EOB prior to standing    Transfers Overall transfer level: Needs assistance Equipment used: Rolling walker (2 wheels) Transfers: Sit to/from Stand Sit to Stand: Min assist           General transfer comment: min assist from bed to rise and steady     Balance Overall balance assessment: Needs assistance   Sitting balance-Leahy Scale: Fair       Standing  balance-Leahy Scale: Fair Standing balance comment: can static stand without UE support, reliant on RW to ambulate                           ADL either performed or assessed with clinical judgement   ADL Overall ADL's : Needs assistance/impaired     Grooming: Wash/dry hands;Standing;Contact guard assist;Wash/dry face;Sitting;Set up               Lower Body Dressing: Moderate assistance;Sitting/lateral leans Lower Body Dressing Details (indicate cue type and reason): pulled socks up once started over his toes             Functional mobility during ADLs: Contact guard assist;Rolling walker (2 wheels)      Extremity/Trunk Assessment              Vision       Perception     Praxis      Cognition Arousal: Alert Behavior During Therapy: Flat affect Overall Cognitive Status: History of cognitive impairments - at baseline                                          Exercises      Shoulder Instructions       General Comments      Pertinent Vitals/ Pain       Pain Assessment Pain Assessment: No/denies pain  Home Living  Prior Functioning/Environment              Frequency  Min 1X/week        Progress Toward Goals  OT Goals(current goals can now be found in the care plan section)  Progress towards OT goals: Progressing toward goals  Acute Rehab OT Goals OT Goal Formulation: With patient/family Time For Goal Achievement: 02/05/23 Potential to Achieve Goals: Good  Plan      Co-evaluation                 AM-PAC OT "6 Clicks" Daily Activity     Outcome Measure   Help from another person eating meals?: A Little Help from another person taking care of personal grooming?: A Little Help from another person toileting, which includes using toliet, bedpan, or urinal?: A Lot Help from another person bathing (including washing, rinsing, drying)?: A  Lot Help from another person to put on and taking off regular upper body clothing?: A Little Help from another person to put on and taking off regular lower body clothing?: A Lot 6 Click Score: 15    End of Session Equipment Utilized During Treatment: Gait belt;Rolling walker (2 wheels)  OT Visit Diagnosis: Unsteadiness on feet (R26.81);Other abnormalities of gait and mobility (R26.89);Muscle weakness (generalized) (M62.81);Other symptoms and signs involving cognitive function   Activity Tolerance Patient tolerated treatment well   Patient Left in chair;with call bell/phone within reach;with chair alarm set   Nurse Communication          Time: [phone] OT Time Calculation (min): 20 min  Charges: OT General Charges $OT Visit: 1 Visit OT Treatments $Self Care/Home Management : 8-22 mins  Tyler Hahn, OTR/L Acute Rehabilitation Services Office: [phone]   Tyler Hahn 01/23/2023, 11:10 AM

## 2023-01-23 NOTE — Progress Notes (Signed)
Ok to resume home Renvela per Dr. Arlean Hopping.  Ulyses Southward, PharmD, BCIDP, AAHIVP, CPP Infectious Disease Pharmacist 01/23/2023 8:33 AM

## 2023-01-23 NOTE — Progress Notes (Signed)
TRH night cross cover note:   I was notified by RN that while the patient has sliding scale insulin ordered on a before every meal basis, that his Accu-Cheks are ordered on q 2-hour and 4-hour basis.  He had previously had some hypoglycemia on 01/21/2023, which is reported to have resolved in the interval, noting that his blood sugars have been greater than 175 for over 24 hours.   I subsequently modified his Accu-Cheks to reflect monitoring on a before every meal and at bedtime basis with corresponding low-dose sliding scale insulin, without bedtime coverage.      Newton Pigg, DO Hospitalist

## 2023-01-23 NOTE — Progress Notes (Addendum)
Progress Note   Patient: Tyler Hahn UYQ:[id number] DOB: [birth year] DOA: 01/21/2023     2 DOS: the patient was seen and examined on 01/23/2023   Brief hospital course: Tyler Hahn was admitted to the hospital with the working diagnosis of volume overload.   82 yo male with the past medical history of ESRD, heart failure, dyslipidemia, T2DM, dementia and hypothyroidism, who presented with edema. Patient was brought to the hospital due to worsening generalized edema. As outpatient not able to complete HD due to cramping and hypotension. On his initial physical examination his blood pressure was 113/73, HR 88, RR 18 and 02 saturation 99%, lungs with no wheezing but positive basilar rales, heart with S1 and S2 present and regular, abdomen with no distention and positive lower extremity edema.  Na 136, K 4,8 Cl 95, bicarbonate 21, glucose 115, bun 72, cr 8,14 Anion gap 20.  AST 29 ALT 22 Wbc 7,3 hgb 12.2 plt 59   Chest radiograph with cardiomegaly, bilateral hilar vascular congestion, pacemaker defibrillator in place with one right atrial and one right ventricular lead.    CT chest with no evidence of central pulmonary embolism, limited evaluation of segmental and subsegmental pulmonary arteries due to motion artifact. Bilateral interstitial infiltrates, small right pleural effusion. Solid pulmonary nodule right middle lobe 13 mm (consider additional 6-12 mo follow up to ensure stability).   EKG 79 bpm, normal axis, qtc 550, sinus rhythm with first degree AV block, poor R R wave progression with no significant ST segment or T wave changes.   08/08 patient has been placed on midodrine to support blood pressure. Inpatient renal replacement therapy for ultrafiltration.   Assessment and Plan: * ESRD (end stage renal disease) (HCC) Plan for HD, he has been placed on midodrine for blood pressure support.  Palliative care has been consulted, if patient does not tolerate HD, may need to concentrate  of comfort care.   Lactic acidosis, up to 6,1. Probably type B acidosis. No sings of systemic infection.   Anemia of chronic renal disease.  Hgb is 11,8.   Acute on chronic systolic CHF (congestive heart failure) (HCC) Echocardiogram with reduced LV systolic function EF 25 to 30%, global hypokinesis, severe dilatation of LV cavity, interventricular septum is flattened in diastole. RV systolic function with severe reduction. Moderate TR,   Attempt ultrafiltration on HD. Continue midodrine for blood pressure support.   Essential hypertension Continue blood pressure monitoring.  Continue with midodrine 5 mg po tid.   Hypothyroid Continue with levothyroxine.   Thrombocytopenia (HCC) Plt is 46 continue close monitoring.  Signs of poor prognosis.   Type 2 diabetes mellitus with hyperlipidemia (HCC) Hypoglycemia. Hyperglycemia.   Follow up glucose is 88 Continue with low dose insulin sliding scale for glucose cover and monitoring.   Continue statin therapy.    Subjective: Patient with no chest pain or dyspnea, continue to have lower extremity edema.   Physical Exam: Vitals:   01/23/23 0300 01/23/23 0500 01/23/23 0758 01/23/23 0801  BP: 115/65 (!) 104/57 (!) 89/51 (!) 94/53  Pulse: 88 84 80   Resp: 14 15 18    Temp: 97.8 F (36.6 C) 98.1 F (36.7 C) 98.9 F (37.2 C)   TempSrc: Oral Oral Oral   SpO2: 95% 100% 92% 92%  Weight:  85.7 kg    Height:        Neurology awake and alert ENT with mild pallor Cardiovascular with S1 and S2 present and regular with no gallops,or rubs, positive murmur  at the right lower sternal border Respiratory with mild rales at bases with no wheezing or rhonchi Abdomen with no distention Positive lower extremity edema +++ pitting  Data Reviewed:    Family Communication: no family at the bedside. I spoke with patient's son over the phone, we talked in detail about patient's condition, plan of care and prognosis and all questions were  addressed. He requested defibrillator the be turned off.     Disposition: Status is: Inpatient Remains inpatient appropriate because: inpatient dialysis, for ultrafiltration   Planned Discharge Destination: Home     Author: Coralie Keens, MD 01/23/2023 9:51 AM  For on call review [email or web address].

## 2023-01-23 NOTE — Progress Notes (Signed)
[facility] Progress Note  Subjective: pt seen in room, is more alert and interactive today. HD yest w/ 3 L off.   Vitals:   01/23/23 0500 01/23/23 0758 01/23/23 0801 01/23/23 1125  BP: (!) 104/57 (!) 89/51 (!) 94/53 (!) 108/57  Pulse: 84 80  85  Resp: 15 18    Temp: 98.1 F (36.7 C) 98.9 F (37.2 C)    TempSrc: Oral Oral    SpO2: 100% 92% 92%   Weight: 85.7 kg     Height:        Exam: Gen alert and responsive On room air Sclera anicteric, throat clear  No jvd or bruits Chest clear bilat to bases RRR no MRG Abd soft ntnd no mass or ascites +bs Ext 2+ diffuse bilat LE edema Neuro is alert, Ox 3 , nf    RUA AVG+bruit          Home meds include - midodrine 10mg , renvela 800 tid, allopurinol, atrovastatin, donepezil, synthroid, loratadine, MVI, omeprazole, sertraline        OP HD: TTS NW  3.5h   400/1.5  81.5kg  2/2.5 bath  AVG   Heparin 4000 - last OP HD 8/03, post wt 86.5kg - coming off 3-6kg over dry wt for last 2 wks - rocaltrol 1.5 mcg po three times per week - sensipar 60mg  po three times per week       Assessment/ Plan: Volume overload / chronic hypotension - pt w/ long hx CHF, chronic hypotension now w/ difficulty getting fluid off at outpatient dialysis. Midodrine started at 5 mg tid. Hopefully this can help. He did well w/ HD yesterday, 3 L off and BP's remained above 90 for the most part. HD again today.  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 HD yest and today.  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 Cont po vdra and sensipar three times per week. Cont renvela as binder.  Dementia  DNR      Vinson Moselle MD  [facility] 01/23/2023, 3:56 PM  Recent Labs  Lab 01/21/23 1500 01/21/23 1610 01/22/23 0634 01/22/23 0637 01/23/23 0617  HGB 12.2*  --  11.8*  --   --   ALBUMIN  --    < >  --  3.0* 2.9*  CALCIUM  --    < >  --  8.5* 8.9  PHOS  --    < >  --  6.7* 5.8*  CREATININE  --    < >  --  8.78* 6.92*  K  --    < >  --   4.8 5.1   < > = values in this interval not displayed.   Recent Labs  Lab 01/21/23 2046  IRON 217*  TIBC 280  FERRITIN 984*   Inpatient medications:  atorvastatin  20 mg Oral Daily   Chlorhexidine Gluconate Cloth  6 each Topical Q0600   donepezil  10 mg Oral QHS   insulin aspart  0-6 Units Subcutaneous TID WC   levothyroxine  50 mcg Oral Daily   loratadine  10 mg Oral Daily   midodrine  5 mg Oral TID WC   mupirocin ointment  1 Application Topical BID   pantoprazole  40 mg Oral Daily   sertraline  100 mg Oral Daily   sevelamer carbonate  800 mg Oral TID WC   sodium chloride flush  3 mL Intravenous Q12H    sodium chloride  sodium chloride, acetaminophen **OR** acetaminophen, glucagon (human recombinant), sodium chloride flush

## 2023-01-23 NOTE — Progress Notes (Addendum)
   01/23/23 0300  Vitals  Temp 97.8 F (36.6 C)  Temp Source Oral  BP Location Left Arm  BP Method Automatic  Patient Position (if appropriate) Lying  Pulse Rate Source Monitor  Oxygen Therapy  O2 Device Nasal Cannula  During Treatment Monitoring  HD Safety Checks Performed Yes  Intra-Hemodialysis Comments Tx completed  Post Treatment  Dialyzer Clearance Lightly streaked  Hemodialysis Intake (mL) 3 mL (3hrs )  Liters Processed 77.3  Post-Hemodialysis Comments pt stable  AVG/AVF Arterial Site Held (minutes) 10 minutes  AVG/AVF Venous Site Held (minutes) 10 minutes   Net UF . Last B/p1 22/65-pulse 86.sat 100% right upper av fistula bruit & thrill present. Pt stable post treatment.Report to Nelly Laurence RN.

## 2023-01-23 NOTE — TOC Initial Note (Signed)
Transition of Care [facility]) - Initial/Assessment Note    Patient Details  Name: Tyler Hahn MRN: [id number] Date of Birth: January 03, 1941  Transition of [facility]) CM/SW Contact:    Leone Haven, RN Phone Number: 01/23/2023, 1:04 PM  Clinical Narrative:                 From home alone, gave this NCM permission to speak with son, per son patient has apartment around the corner from him.  Son states there is always someone there with him.  He has an aide Mon- Friday and when she is not there , he is there with his father and on the weekends, he and his siblings rotate.  He has PCP and insurance on file.  NCM offered choice for HHPT, HHOT, Christiane Ha states he has no preference.  NCM made referral to [facility] with [facility] for HHPT, HHOT. He is able to take referral.  Soc will begin 24 to 48 hrs post dc.  He has a cane, walker and shower chair at home.  Christiane Ha will transport him home at [employer] and he and his family is support system.  [facility] [address] self [facility]ulatory.    Expected Discharge Plan: Home w Home Health Services Barriers to Discharge: Continued Medical Work up   Patient Goals and CMS Choice Patient states their goals for this hospitalization and ongoing recovery are:: get better   Choice offered to / list presented to : Adult Children [place] ownership interest in [facility].provided to:: Adult Children    Expected Discharge Plan and Services In-house Referral: NA Discharge Planning Services: CM Consult Post Acute Care Choice: Home Health Living arrangements for the past 2 months: Apartment                 DME Arranged: N/A DME Agency: NA       HH Arranged: PT, OT HH Agency: [facility] Date [facility] Agency Contacted: 01/23/23 Time HH Agency Contacted: [id number] Representative spoke with at [facility] Agency: Kandee Keen  Prior Living Arrangements/Services Living arrangements for the past 2 months: Apartment Lives with:: Self Patient  language and need for interpreter reviewed:: Yes Do you feel safe going back to the place where you live?: Yes      Need for Family Participation in Patient Care: Yes (Comment) Care giver support system in place?: Yes (comment) Current home services: DME (cane, walker, shower chair) Criminal Activity/Legal Involvement Pertinent to Current Situation/Hospitalization: No - Comment as needed  Activities of Daily Living Home Assistive Devices/Equipment: Shower chair with back, Eyeglasses, Environmental consultant (specify type) ADL Screening (condition at time of admission) Patient's cognitive ability adequate to safely complete daily activities?: No Is the patient deaf or have difficulty hearing?: No Does the patient have difficulty seeing, even when wearing glasses/contacts?: No Does the patient have difficulty concentrating, remembering, or making decisions?: Yes Patient able to express need for assistance with ADLs?: Yes Does the patient have difficulty dressing or bathing?: Yes Independently performs ADLs?: No Communication: Independent Dressing (OT): Needs assistance Is this a change from baseline?: Pre-admission baseline Grooming: Needs assistance Is this a change from baseline?: Pre-admission baseline Feeding: Independent Bathing: Needs assistance Is this a change from baseline?: Pre-admission baseline Toileting: Needs assistance Is this a change from baseline?: Pre-admission baseline In/Out Bed: Independent Walks in Home: Independent Does the patient have difficulty walking or climbing stairs?: Yes Weakness of Legs: Both Weakness of Arms/Hands: Both  Permission Sought/Granted Permission sought to  share information with : Case Manager Permission granted to share information with : Yes, Verbal Permission Granted              Emotional Assessment         Alcohol / Substance Use: Not Applicable Psych Involvement: No (comment)  Admission diagnosis:  Fluid overload [E87.70] Patient  Active Problem List   Diagnosis Date Noted   Acute on chronic systolic CHF (congestive heart failure) (HCC) 01/22/2023   Fluid overload 01/21/2023   Thrombocytopenia (HCC) 01/21/2023   Lactic acidosis 01/21/2023   Hypotension 01/21/2023   Sleep disturbances 10/04/2022   Dementia (HCC) 06/05/2022   Avulsion of nail of right middle finger 10/30/2021   Felon of finger 08/24/2021   Multiple falls 08/03/2021   VT (ventricular tachycardia) (HCC) 03/12/2021   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 03/02/2020   Weakness    ESRD (end stage renal disease) (HCC) 08/30/2019   History of anemia due to CKD 08/18/2019   Hyperkalemia 08/17/2019   Hypothyroid 08/16/2019   Physical exam 02/18/2019   Greater trochanteric bursitis of left hip 08/04/2018   Osteoarthritis 09/25/2016   Screen for colon cancer    Benign neoplasm of transverse colon    Edema 07/28/2014   Adjustment disorder with depressed mood 03/24/2014   Small bowel mass 12/20/2013   Myalgia 07/07/2013   Loss of weight 04/19/2013   Polyarthralgia 04/19/2013   Loss of appetite 04/19/2013   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   SYSTOLIC HEART FAILURE, CHRONIC 06/07/2010   TESTICULAR MASS 04/04/2010   Type 2 diabetes mellitus with hyperlipidemia (HCC) 03/07/2010   Hyperlipidemia 03/07/2010   GOUT 03/07/2010   Essential hypertension 03/07/2010   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   PCP:  Sheliah Hatch, MD Pharmacy:   [facility] [phone] - Closed [place], [place] - [phone] [address] AT [facility] [address] [place] [address] Phone: [phone] Fax: [phone]  [facility] [place] [facility] [address] [place] [address] Phone: [phone] Fax: [phone]  [facility] [place], [place] - [address] AT Portal to Registered  [facility] [address] [place] [address] Phone: [phone] Fax: [phone]  [id number] [place], [place] - [address] [place] [address] Phone: [phone] Fax: [phone]     Social Determinants of Health (SDOH) Social History: SDOH Screenings   Food Insecurity: No Food Insecurity (01/21/2023)  Housing: Low Risk  (01/21/2023)  Transportation Needs: No Transportation Needs (01/21/2023)  Utilities: Not At Risk (01/21/2023)  Alcohol Screen: Low Risk  (12/04/2022)  Depression (PHQ2-9): Medium Risk (01/16/2023)  Financial Resource Strain: Low Risk  (12/04/2022)  Physical Activity: Insufficiently Active (12/04/2022)  Social Connections: Moderately Integrated (12/04/2022)  Stress: No Stress Concern Present (12/04/2022)  Tobacco Use: Medium Risk (01/21/2023)   SDOH Interventions:     Readmission Risk Interventions    01/23/2023   12:57 PM  Readmission Risk Prevention Plan  Transportation Screening Complete  PCP or Specialist Appt within 5-7 Days --  Home Care Screening Complete  Medication Review (RN CM) Complete

## 2023-01-24 ENCOUNTER — Encounter: Payer: Medicare HMO | Admitting: Vascular Surgery

## 2023-01-24 ENCOUNTER — Other Ambulatory Visit (HOSPITAL_COMMUNITY): Payer: Self-pay

## 2023-01-24 DIAGNOSIS — I5023 Acute on chronic systolic (congestive) heart failure: Secondary | ICD-10-CM | POA: Diagnosis not present

## 2023-01-24 DIAGNOSIS — N186 End stage renal disease: Secondary | ICD-10-CM | POA: Diagnosis not present

## 2023-01-24 DIAGNOSIS — I10 Essential (primary) hypertension: Secondary | ICD-10-CM | POA: Diagnosis not present

## 2023-01-24 DIAGNOSIS — E039 Hypothyroidism, unspecified: Secondary | ICD-10-CM | POA: Diagnosis not present

## 2023-01-24 LAB — CBC
HCT: 33 % — ABNORMAL LOW (ref 39.0–52.0)
Hemoglobin: 11.1 g/dL — ABNORMAL LOW (ref 13.0–17.0)
MCH: 30.2 pg (ref 26.0–34.0)
MCHC: 33.6 g/dL (ref 30.0–36.0)
MCV: 89.7 fL (ref 80.0–100.0)
Platelets: 79 10*3/uL — ABNORMAL LOW (ref 150–400)
RBC: 3.68 MIL/uL — ABNORMAL LOW (ref 4.22–5.81)
RDW: 20.5 % — ABNORMAL HIGH (ref 11.5–15.5)
WBC: 6.1 10*3/uL (ref 4.0–10.5)
nRBC: 2.5 % — ABNORMAL HIGH (ref 0.0–0.2)

## 2023-01-24 LAB — GLUCOSE, CAPILLARY
Glucose-Capillary: 86 mg/dL (ref 70–99)
Glucose-Capillary: 105 mg/dL — ABNORMAL HIGH (ref 70–99)
Glucose-Capillary: 77 mg/dL (ref 70–99)
Glucose-Capillary: 66 mg/dL — ABNORMAL LOW (ref 70–99)
Glucose-Capillary: 116 mg/dL — ABNORMAL HIGH (ref 70–99)

## 2023-01-24 MED ORDER — LIDOCAINE-PRILOCAINE 2.5-2.5 % EX CREA: 1.0000 | TOPICAL_CREAM | CUTANEOUS | Status: DC | PRN

## 2023-01-24 MED ORDER — LIDOCAINE HCL (PF) 1 % IJ SOLN: 5.0000 mL | INTRAMUSCULAR | Status: DC | PRN

## 2023-01-24 MED ORDER — MIDODRINE HCL 5 MG PO TABS
5.0000 mg | ORAL_TABLET | Freq: Three times a day (TID) | ORAL | 0 refills | Status: AC
  Filled 2023-01-24: qty 90, 30d supply, fill #0

## 2023-01-24 MED ORDER — CALCITRIOL 0.5 MCG PO CAPS
1.5000 ug | ORAL_CAPSULE | ORAL | Status: DC
  Administered 2023-01-25: 1.5 ug via ORAL
  Filled 2023-01-24: qty 3

## 2023-01-24 MED ORDER — HEPARIN SODIUM (PORCINE) 1000 UNIT/ML DIALYSIS: 1000.0000 [IU] | INTRAMUSCULAR | Status: DC | PRN

## 2023-01-24 MED ORDER — DEXTROSE 50 % IV SOLN: 1.0000 | INTRAVENOUS | Status: DC | PRN

## 2023-01-24 MED ORDER — CINACALCET HCL 30 MG PO TABS
60.0000 mg | ORAL_TABLET | ORAL | Status: DC
  Administered 2023-01-25: 60 mg via ORAL
  Filled 2023-01-24: qty 2

## 2023-01-24 MED ORDER — HEPARIN SODIUM (PORCINE) 1000 UNIT/ML DIALYSIS
4000.0000 [IU] | Freq: Once | INTRAMUSCULAR | Status: AC
  Administered 2023-01-24: 4000 [IU] via INTRAVENOUS_CENTRAL
  Filled 2023-01-24: qty 4

## 2023-01-24 MED ORDER — ALTEPLASE 2 MG IJ SOLR: 2.0000 mg | Freq: Once | INTRAMUSCULAR | Status: DC | PRN

## 2023-01-24 MED ORDER — CINACALCET HCL 30 MG PO TABS
60.0000 mg | ORAL_TABLET | ORAL | 0 refills | Status: AC
  Filled 2023-01-24: qty 24, 28d supply, fill #0

## 2023-01-24 MED ORDER — PENTAFLUOROPROP-TETRAFLUOROETH EX AERO: 1.0000 | INHALATION_SPRAY | CUTANEOUS | Status: DC | PRN

## 2023-01-24 MED ORDER — CALCITRIOL 0.5 MCG PO CAPS
1.5000 ug | ORAL_CAPSULE | ORAL | 0 refills | Status: AC
  Filled 2023-01-24: qty 36, 28d supply, fill #0

## 2023-01-24 MED ORDER — ANTICOAGULANT SODIUM CITRATE 4% (200MG/5ML) IV SOLN: 5.0000 mL | Status: DC | PRN

## 2023-01-24 NOTE — Progress Notes (Signed)
Verbal report given by phone to [place], RN with HD.

## 2023-01-24 NOTE — Progress Notes (Signed)
TRH night cross cover note:   I was notified by RN that the patient experienced a hypoglycemic event.  He reportedly had a CBG result of 77 prior to this starting night shift, but refused encouragement to eat or drink at that time.  He subsequently went for his hemodialysis, and upon return from hemodialysis, nightly CBG result was noted to be 66.  He subsequently drank soda and ate dinner, and additional hypoglycemic orders were initiated per standing orders, with ensuing improvement in CBG to 116.  I subsequently also added prn amp of D50 for CBG result less than 70.    Newton Pigg, DO Hospitalist

## 2023-01-24 NOTE — Care Management Important Message (Signed)
Important Message  Patient Details  Name: Tyler Hahn MRN: [id number] Date of Birth: Jan 22, 1941   Medicare Important Message Given:  Yes     Renie Ora 01/24/2023, 8:26 AM

## 2023-01-24 NOTE — Progress Notes (Signed)
D/C order noted. Contacted [facility] to advise clinic of pt's d/c today and that pt should resume care tomorrow.  Tracy Mounce Renal Navigator [phone]   

## 2023-01-24 NOTE — Progress Notes (Signed)
   01/24/23 1735  Vitals  Temp 97.6 F (36.4 C)  Pulse Rate 85  Resp 15  BP 105/67  SpO2 96 %  Post Treatment  Dialyzer Clearance Clear  Duration of HD Treatment -hour(s) 3.25 hour(s)  Hemodialysis Intake (mL) 0 mL  Liters Processed 73  Fluid Removed (mL) 2 mL  Tolerated HD Treatment Yes  AVG/AVF Arterial Site Held (minutes) 8 minutes  AVG/AVF Venous Site Held (minutes) 8 minutes   Received patient in bed to unit.  Alert and oriented.  Informed consent signed and in chart.   TX duration:3.25hrs  Patient tolerated well.  Transported back to the room  Alert, without acute distress.  Hand-off given to patient's nurse.   Access used: RAVG Access issues: none  Total UF removed: 2L Medication(s) given: none   Na'Shaminy T  [facility]

## 2023-01-24 NOTE — Progress Notes (Signed)
PT Cancellation Note  Patient Details Name: Tyler Hahn MRN: [id number] DOB: 1940/12/26   Cancelled Treatment:    Reason Eval/Treat Not Completed: (P) Patient at procedure or test/unavailable (Pt in HD, will follow up as able.)   Johny Shock 01/24/2023, 1:53 PM

## 2023-01-24 NOTE — Progress Notes (Signed)
Received patient back on department at 1850 on 01/24/23. Contacted Dr. Ella Jubilee to notify of low BP and new confusion compared to before HD.  Order for discharge discontinued. Christiane Ha (patient's son) called to notify of cancelled discharge. Son in agreement with plan for patient to stay tonight.

## 2023-01-24 NOTE — Discharge Summary (Addendum)
Physician Discharge Summary   Patient: Tyler Hahn MRN: [id number] DOB: 12-May-1941  Admit date:     01/21/2023  Discharge date: 01/24/23  Discharge Physician: York Ram    PCP: Sheliah Hatch, MD   Recommendations at discharge:    Patient has been placed on midodrine for blood pressure support. Continue with outpatient renal replacement therapy and ultrafiltration.  ICD capability has been turned off, per his son request, Tyler Hahn.  Follow up as outpatient with palliative care.   Discharge Diagnoses: Principal Problem:   ESRD (end stage renal disease) (HCC) Active Problems:   Acute on chronic systolic CHF (congestive heart failure) (HCC)   Essential hypertension   Hypothyroid   Thrombocytopenia (HCC)   Type 2 diabetes mellitus with hyperlipidemia (HCC)  Resolved Problems:   * No resolved hospital problems. [facility] Course: Tyler Hahn was admitted to the hospital with the working diagnosis of volume overload.   82 yo male with the past medical history of ESRD (TTS), heart failure, dyslipidemia, T2DM, dementia and hypothyroidism, who presented with edema. Patient was brought to the hospital due to worsening generalized edema. As outpatient not able to complete HD due to cramping and hypotension. On his initial physical examination his blood pressure was 113/73, HR 88, RR 18 and 02 saturation 99%, lungs with no wheezing but positive basilar rales, heart with S1 and S2 present and regular, abdomen with no distention and positive lower extremity edema.  Na 136, K 4,8 Cl 95, bicarbonate 21, glucose 115, bun 72, cr 8,14 Anion gap 20.  AST 29 ALT 22 Wbc 7,3 hgb 12.2 plt 59   Chest radiograph with cardiomegaly, bilateral hilar vascular congestion, pacemaker defibrillator in place with one right atrial and one right ventricular lead.    CT chest with no evidence of central pulmonary embolism, limited evaluation of segmental and subsegmental pulmonary  arteries due to motion artifact. Bilateral interstitial infiltrates, small right pleural effusion. Solid pulmonary nodule right middle lobe 13 mm (consider additional 6-12 mo follow up to ensure stability).   EKG 79 bpm, normal axis, qtc 550, sinus rhythm with first degree AV block, poor R R wave progression with no significant ST segment or T wave changes.   08/08 patient has been placed on midodrine to support blood pressure. Inpatient renal replacement therapy for ultrafiltration.  08/09 patient tolerated well 2 days of consecutive HD, plan to discharge home and continue midodrine for blood pressure support.   Assessment and Plan: * ESRD (end stage renal disease) (HCC) Volume overload. Hypokalemia  Patient was placed on midodrine for blood pressure support and underwent hemodialysis with ultrafiltration for 2 consecutive days.  Negative fluid balance was achieved with good toleration.   Very poor prognosis in the setting of advanced heart failure and ESRD on HD. Palliative care has been consulted, and his son requested ICD capability to be turned off.  At the time of his discharge his BUN is 32 from 56, his K is 3.3 and serum bicarbonate at 27.  Na 135.   Lactic acidosis, up to 6,1. Probably type B acidosis. No sings of systemic infection.  Sepsis was ruled out.   Anemia of chronic renal disease.  Hgb is 11,8.   Metabolic bone disease, continue with cinacalcet and calcitriol.   Acute on chronic systolic CHF (congestive heart failure) (HCC) Echocardiogram with reduced LV systolic function EF 25 to 30%, global hypokinesis, severe dilatation of LV cavity, interventricular septum is flattened in diastole. RV systolic function  with severe reduction. 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  Very poor prognosis, continue fluid management with ultrafiltration.   I spoke with his son over the phone and he  requested defibrillator to be turned off.  Outpatient follow up with palliative care.   Essential hypertension Continue blood pressure monitoring.  Continue with midodrine 5 mg po tid.   Hypothyroid Continue with levothyroxine.   Thrombocytopenia (HCC) Plt is 46 continue close monitoring.  Signs of poor prognosis.   Type 2 diabetes mellitus with hyperlipidemia (HCC) Hypoglycemia. Hyperglycemia.   Patient was placed on IV dextrose, then required low dose insulin.  At the time of his discharge his fasting glucose is 100 mg/dl.   Continue statin therapy.          Consultants: Nephrology  Procedures performed: none   Disposition: Home Diet recommendation:  Cardiac and Carb modified diet DISCHARGE MEDICATION: Allergies as of 01/24/2023       Reactions   Sulfa Antibiotics Itching, Rash   Sulfonamide Derivatives Itching, Rash        Medication List     STOP taking these medications    doxycycline 100 MG tablet Commonly known as: VIBRA-TABS       TAKE these medications    allopurinol 100 MG tablet Commonly known as: ZYLOPRIM TAKE 1 TABLET BY MOUTH 3 TIMES A WEEK ON MONDAY, WEDNESDAY AND FRIDAY   atorvastatin 20 MG tablet Commonly known as: LIPITOR Take 1 tablet (20 mg total) by mouth daily.   calcitRIOL 0.5 MCG capsule Commonly known as: ROCALTROL Take 3 capsules (1.5 mcg total) by mouth every Tuesday, Thursday, and Saturday at 6 PM. Start taking on: January 25, 2023   cinacalcet 30 MG tablet Commonly known as: SENSIPAR Take 2 tablets (60 mg total) by mouth every Tuesday, Thursday, and Saturday at 6 PM. Start taking on: January 25, 2023   donepezil 10 MG tablet Commonly known as: ARICEPT TAKE 1 TABLET BY MOUTH EVERYDAY AT BEDTIME   levothyroxine 50 MCG tablet Commonly known as: SYNTHROID Take 1 tablet (50 mcg total) by mouth daily.   loratadine 10 MG tablet Commonly known as: CLARITIN Take 1 tablet (10 mg total) by mouth daily.   midodrine 5  MG tablet Commonly known as: PROAMATINE Take 1 tablet (5 mg total) by mouth 3 (three) times daily with meals. What changed:  medication strength how much to take when to take this   multivitamin with minerals tablet Take 1 tablet by mouth daily.   mupirocin ointment 2 % Commonly known as: BACTROBAN Apply 1 Application topically 2 (two) times daily.   omeprazole 20 MG capsule Commonly known as: PRILOSEC Take 1 capsule (20 mg total) by mouth daily.   sertraline 100 MG tablet Commonly known as: ZOLOFT TAKE 1.5 TABLETS (150MG  TOTAL) BY MOUTH DAILY   sevelamer carbonate 800 MG tablet Commonly known as: RENVELA Take 800 mg by mouth 3 (three) times daily with meals.        Follow-up Information     Sheliah Hatch, MD Follow up.   Specialty: Family Medicine Contact information: [address] [place] [address] [phone]         [facility] Follow up.   Specialty: Home Health Services Why: Agency will call to set up apt times Contact information: [address] [place] [address] [phone]  Discharge Exam: Filed Weights   01/22/23 0458 01/23/23 0500 01/23/23 2200  Weight: 87.7 kg 85.7 kg 86.5 kg   BP 91/62 (BP Location: Left Arm)   Pulse 89   Temp 98.7 F (37.1 C)   Resp 18   Ht 5\' 7"  (1.702 m)   Wt 86.5 kg   SpO2 93%   BMI 29.87 kg/m   Patient with no chest pain or dyspnea, edema has improved.   Neurology awake and aleart ENT with mild pallor Cardiovascular with S1 and S2 present and regular, positive systolic murmur at the right lower sternal border No JVD Trace lower extremity edema Respiratory with no rales or wheezing, no rhonchi Abdomen with no distention   Condition at discharge: stable  The results of significant diagnostics from this hospitalization (including imaging, microbiology, ancillary and laboratory) are listed below for reference.   Imaging  Studies: ECHOCARDIOGRAM COMPLETE  Result Date: 01/22/2023    ECHOCARDIOGRAM REPORT   Patient Name:   Tyler Hahn Date of Exam: 01/22/2023 Medical Rec #:  [id number]        Height:       67.0 in Accession #:    [id number]       Weight:       193.3 lb Date of Birth:  [birth year]       BSA:          1.993 m Patient Age:    81 years         BP:           110/66 mmHg Patient Gender: M                HR:           80 bpm. Exam Location:  Inpatient Procedure: 2D Echo, Color Doppler, Cardiac Doppler and Intracardiac            Opacification Agent Indications:    I50.31 Acute diastolic (congestive) heart failure  History:        Patient has no prior history of Echocardiogram examinations.                 CHF, Defibrillator; Risk Factors:Diabetes, Hypertension and                 Dyslipidemia.  Sonographer:    Harriette Bouillon RDCS Referring Phys: Therisa Doyne IMPRESSIONS  1. Poor acoustical windows and forshortening of images during definity contrast limits accurate assessment of EF. Left ventricular ejection fraction, by estimation, is 25 to 30%. The left ventricle has severely decreased function. The left ventricle demonstrates global hypokinesis. The left ventricular internal cavity size was severely dilated. Left ventricular diastolic function could not be evaluated. There is the interventricular septum is flattened in diastole ('D' shaped left ventricle), consistent with right ventricular volume overload.  2. Right ventricular systolic function is severely reduced. The right ventricular size is normal.  3. The mitral valve is normal in structure. Mild mitral valve regurgitation. No evidence of mitral stenosis.  4. Tricuspid valve regurgitation is moderate.  5. The aortic valve is tricuspid. Aortic valve regurgitation is not visualized. Aortic valve sclerosis/calcification is present, without any evidence of aortic stenosis.  6. The inferior vena cava is normal in size with greater than 50% respiratory  variability, suggesting right atrial pressure of 3 mmHg. FINDINGS  Left Ventricle: Left ventricular ejection fraction, by estimation, is 25 to 30%. The left ventricle has severely decreased function. The left ventricle demonstrates global hypokinesis. Definity contrast agent was  given IV to delineate the left ventricular endocardial borders. The left ventricular internal cavity size was severely dilated. There is no left ventricular hypertrophy. The interventricular septum is flattened in diastole ('D' shaped left ventricle), consistent with right ventricular  volume overload. Left ventricular diastolic function could not be evaluated. Right Ventricle: The right ventricular size is normal. No increase in right ventricular wall thickness. Right ventricular systolic function is severely reduced. Left Atrium: Left atrial size was normal in size. Right Atrium: Right atrial size was normal in size. Pericardium: There is no evidence of pericardial effusion. Mitral Valve: The mitral valve is normal in structure. Mild mitral valve regurgitation. No evidence of mitral valve stenosis. Tricuspid Valve: The tricuspid valve is normal in structure. Tricuspid valve regurgitation is moderate . No evidence of tricuspid stenosis. Aortic Valve: The aortic valve is tricuspid. Aortic valve regurgitation is not visualized. Aortic valve sclerosis/calcification is present, without any evidence of aortic stenosis. Pulmonic Valve: The pulmonic valve was normal in structure. Pulmonic valve regurgitation is mild. No evidence of pulmonic stenosis. Aorta: The aortic root is normal in size and structure. Venous: The inferior vena cava is normal in size with greater than 50% respiratory variability, suggesting right atrial pressure of 3 mmHg. IAS/Shunts: No atrial level shunt detected by color flow Doppler. Additional Comments: A device lead is visualized.  LEFT VENTRICLE PLAX 2D LVIDd:         6.30 cm LVIDs:         5.50 cm LV PW:         0.80 cm  LV IVS:        0.80 cm LVOT diam:     2.10 cm LVOT Area:     3.46 cm  LV Volumes (MOD) LV vol d, MOD A2C: 127.0 ml LV vol d, MOD A4C: 169.0 ml LV vol s, MOD A2C: 81.2 ml LV vol s, MOD A4C: 121.0 ml LV SV MOD A2C:     45.8 ml LV SV MOD A4C:     169.0 ml LV SV MOD BP:      47.6 ml RIGHT VENTRICLE TAPSE (M-mode): 0.5 cm LEFT ATRIUM           Index LA Vol (A4C): 68.1 ml 34.16 ml/m   AORTA Ao Root diam: 3.40 cm Ao Asc diam:  3.30 cm MITRAL VALVE               TRICUSPID VALVE MV Area (PHT): 4.21 cm    TR Peak grad:   19.5 mmHg MV Decel Time: 180 msec    TR Vmax:        221.00 cm/s MR PISA:        1.01 cm MR PISA Radius: 0.40 cm    SHUNTS MV E velocity: 88.30 cm/s  Systemic Diam: 2.10 cm Armanda Magic MD Electronically signed by Armanda Magic MD Signature Date/Time: 01/22/2023/1:11:34 PM    Final    CT Angio Chest Pulmonary Embolism (PE) W or WO Contrast  Result Date: 01/22/2023 CLINICAL DATA:  Pulmonary embolus suspected EXAM: CT ANGIOGRAPHY CHEST WITH CONTRAST TECHNIQUE: Multidetector CT imaging of the chest was performed using the standard protocol during bolus administration of intravenous contrast. Multiplanar CT image reconstructions and MIPs were obtained to evaluate the vascular anatomy. RADIATION DOSE REDUCTION: This exam was performed according to the departmental dose-optimization program which includes automated exposure control, adjustment of the mA and/or kV according to patient size and/or use of iterative reconstruction technique. CONTRAST:  75mL OMNIPAQUE IOHEXOL 350 MG/ML  SOLN COMPARISON:  Chest CT dated May 31, 2022 FINDINGS: Cardiovascular: No evidence of central pulmonary embolus, limited evaluation of the segmental and subsegmental pulmonary arteries due to motion artifact. Cardiomegaly. No pericardial effusion. Normal caliber thoracic aorta with moderate atherosclerotic disease. Severe coronary artery calcifications. Locules of gas are seen in the right atrial appendage, likely injection  related. Left chest wall AICD with leads in the right atrium and right ventricle. Mediastinum/Nodes: Esophagus and thyroid are unremarkable. No enlarged lymph nodes seen in the chest. Lungs/Pleura: Central airways are patent. Bilateral bronchial wall thickening and mild bilateral interstitial thickening. Left lower lobe atelectasis. Small right pleural effusion. Solid pulmonary nodule of the right middle lobe measuring 13 x 8 mm on series 9, image 30, unchanged when compared with the prior. No pneumothorax. Upper Abdomen: No acute abnormality. Musculoskeletal: No chest wall abnormality. No acute or significant osseous findings. Review of the MIP images confirms the above findings. IMPRESSION: 1. No evidence of central pulmonary embolus, limited evaluation of the segmental and subsegmental pulmonary arteries due to motion artifact. 2. Bilateral bronchial wall thickening and mild bilateral interstitial thickening, suggestive of pulmonary edema. 3. Small right pleural effusion. 4. Solid pulmonary nodule of the right middle lobe measuring 13 mm, unchanged when compared with the prior. Consider additional 6-12 month follow-up to ensure continued stability. 5. Coronary artery calcifications and aortic Atherosclerosis (ICD10-I70.0). Electronically Signed   By: Allegra Lai M.D.   On: 01/22/2023 10:44   CT VENOGRAM ABD/PELVIS/LOWER EXT BILAT  Result Date: 01/21/2023 CLINICAL DATA:  Worsening edema and legs. EXAM: CT VENOGRAM ABDOMEN AND PELVIS AND LOWER EXTREMITY BILATERAL TECHNIQUE: Venographic phase images of the abdomen, pelvis and lower extremities were obtained following the administration of intravenous contrast. Multiplanar reformats and maximum intensity projections were generated. RADIATION DOSE REDUCTION: This exam was performed according to the departmental dose-optimization program which includes automated exposure control, adjustment of the mA and/or kV according to patient size and/or use of iterative  reconstruction technique. CONTRAST:  OMNIPAQUE IOHEXOL 350 MG/ML SOLN COMPARISON:  None Available. FINDINGS: Lower chest: Cardiomegaly. Pacer wires partially visualized in the right heart. Aortic atherosclerosis. Trace bilateral pleural effusions. Hepatobiliary: Reflux of contrast into the IVC and hepatic veins compatible with right heart dysfunction. No focal hepatic abnormality. Gallbladder unremarkable. Pancreas: No focal abnormality or ductal dilatation. Spleen: No focal abnormality.  Normal size. Adrenals/Urinary Tract: Adrenal glands normal. Kidneys are atrophic. Numerous bilateral renal cysts which appear benign. No follow-up imaging recommended. No visible stones or hydronephrosis. Urinary bladder decompressed, grossly unremarkable. Stomach/Bowel: Normal appendix. Sigmoid diverticulosis. No active diverticulitis. Stomach and small bowel decompressed, unremarkable. Vascular/Lymphatic: Diffuse aortoiliac atherosclerosis. No aneurysm or dissection. No adenopathy. No visible IVC or iliac venous clot. Reproductive: No visible focal abnormality. Other: No free fluid or free air. Musculoskeletal: No acute bony abnormality. IVC: No evidence for thrombus or stenosis. Portal and mesenteric veins: No evidence for thrombus or stenosis. Bilateral iliac veins: No evidence for thrombus or stenosis. Right lower extremity: No evidence for thrombus or stenosis to the level of the knee. Left lower extremity: No evidence for thrombus or stenosis to the level of the knee. IMPRESSION: No evidence of venous thrombus in the abdomen, pelvis or lower extremities to the level of the knee. Cardiomegaly. Reflux of contrast into the hepatic veins and IVC suggest right heart dysfunction. Aortoiliac atherosclerosis. Sigmoid diverticulosis. Trace bilateral pleural effusions. Electronically Signed   By: Charlett Nose M.D.   On: 01/21/2023 17:33   DG Chest 2 View  Result  Date: 01/21/2023 CLINICAL DATA:  Shortness of breath EXAM: CHEST  - 2 VIEW COMPARISON:  X-ray 05/31/2022 FINDINGS: Enlarged cardiopericardial silhouette. Central vascular congestion. Calcified tortuous aorta. Left upper chest defibrillator. Underinflation. No consolidation, pneumothorax or edema. Overlapping cardiac leads. Lateral view is limited by under penetration. IMPRESSION: Enlarged heart with vascular congestion.  Defibrillator. Underinflation. Limited lateral view Electronically Signed   By: Karen Kays M.D.   On: 01/21/2023 16:15   US Venous Img Lower Bilateral (DVT)  Result Date: 01/17/2023 CLINICAL DATA:  82 year old male with swelling EXAM: BILATERAL LOWER EXTREMITY VENOUS DOPPLER ULTRASOUND TECHNIQUE: Gray-scale sonography with graded compression, as well as color Doppler and duplex ultrasound were performed to evaluate the lower extremity deep venous systems from the level of the common femoral vein and including the common femoral, femoral, profunda femoral, popliteal and calf veins including the posterior tibial, peroneal and gastrocnemius veins when visible. The superficial great saphenous vein was also interrogated. Spectral Doppler was utilized to evaluate flow at rest and with distal augmentation maneuvers in the common femoral, femoral and popliteal veins. COMPARISON:  None Available. FINDINGS: RIGHT LOWER EXTREMITY Common Femoral Vein: No evidence of thrombus. Normal compressibility, respiratory phasicity and response to augmentation. Saphenofemoral Junction: No evidence of thrombus. Normal compressibility and flow on color Doppler imaging. Profunda Femoral Vein: No evidence of thrombus. Normal compressibility and flow on color Doppler imaging. Femoral Vein: No evidence of thrombus. Normal compressibility, respiratory phasicity and response to augmentation. Popliteal Vein: No evidence of thrombus. Normal compressibility, respiratory phasicity and response to augmentation. Calf Veins: No evidence of thrombus. Normal compressibility and flow on color  Doppler imaging. Superficial Great Saphenous Vein: No evidence of thrombus. Normal compressibility and flow on color Doppler imaging. Other Findings:  Edema LEFT LOWER EXTREMITY Common Femoral Vein: No evidence of thrombus. Normal compressibility, respiratory phasicity and response to augmentation. Saphenofemoral Junction: No evidence of thrombus. Normal compressibility and flow on color Doppler imaging. Profunda Femoral Vein: No evidence of thrombus. Normal compressibility and flow on color Doppler imaging. Femoral Vein: No evidence of thrombus. Normal compressibility, respiratory phasicity and response to augmentation. Popliteal Vein: No evidence of thrombus. Normal compressibility, respiratory phasicity and response to augmentation. Calf Veins: No evidence of thrombus. Normal compressibility and flow on color Doppler imaging. Superficial Great Saphenous Vein: No evidence of thrombus. Normal compressibility and flow on color Doppler imaging. Other Findings:  Edema IMPRESSION: Directed duplex of the bilateral lower extremities negative for DVT. Bilateral edema Signed, Yvone Neu. Miachel Roux, RPVI [facility] Electronically Signed   By: Gilmer Mor D.O.   On: 01/17/2023 10:41    Microbiology: Results for orders placed or performed during the hospital encounter of 05/31/22  Resp panel by RT-PCR (RSV, Flu A&B, Covid) Anterior Nasal Swab     Status: None   Collection Time: 05/31/22  3:49 PM   Specimen: Anterior Nasal Swab  Result Value Ref Range Status   SARS Coronavirus 2 by RT PCR NEGATIVE NEGATIVE Final    Comment: (NOTE) SARS-CoV-2 target nucleic acids are NOT DETECTED.  The SARS-CoV-2 RNA is generally detectable in upper respiratory specimens during the acute phase of infection. The lowest concentration of SARS-CoV-2 viral copies this assay can detect is 138 copies/mL. A negative result does not preclude SARS-Cov-2 infection and should  not be used as the sole basis for treatment or other patient management decisions. A negative result may occur with  improper specimen collection/handling, submission of specimen other than nasopharyngeal swab, presence  of viral mutation(s) within the areas targeted by this assay, and inadequate number of viral copies(<138 copies/mL). A negative result must be combined with clinical observations, patient history, and epidemiological information. The expected result is Negative.  Fact Sheet for Patients:  BloggerCourse.com  Fact Sheet for Healthcare Providers:  SeriousBroker.it  This test is no t yet approved or cleared by the [place] FDA and  has been authorized for detection and/or diagnosis of SARS-CoV-2 by FDA under an Emergency Use Authorization (EUA). This EUA will remain  in effect (meaning this test can be used) for the duration of the COVID-19 declaration under Section 564(b)(1) of the Act, 21 U.S.C.section 360bbb-3(b)(1), unless the authorization is terminated  or revoked sooner.       Influenza A by PCR NEGATIVE NEGATIVE Final   Influenza B by PCR NEGATIVE NEGATIVE Final    Comment: (NOTE) The Xpert Xpress SARS-CoV-2/FLU/RSV plus assay is intended as an aid in the diagnosis of influenza from Nasopharyngeal swab specimens and should not be used as a sole basis for treatment. Nasal washings and aspirates are unacceptable for Xpert Xpress SARS-CoV-2/FLU/RSV testing.  Fact Sheet for Patients: BloggerCourse.com  Fact Sheet for Healthcare Providers: SeriousBroker.it  This test is not yet approved or cleared by the [place] FDA and has been authorized for detection and/or diagnosis of SARS-CoV-2 by FDA under an Emergency Use Authorization (EUA). This EUA will remain in effect (meaning this test can be used) for the duration of the COVID-19 declaration under  Section 564(b)(1) of the Act, 21 U.S.C. section 360bbb-3(b)(1), unless the authorization is terminated or revoked.     Resp Syncytial Virus by PCR NEGATIVE NEGATIVE Final    Comment: (NOTE) Fact Sheet for Patients: BloggerCourse.com  Fact Sheet for Healthcare Providers: SeriousBroker.it  This test is not yet approved or cleared by the [place] FDA and has been authorized for detection and/or diagnosis of SARS-CoV-2 by FDA under an Emergency Use Authorization (EUA). This EUA will remain in effect (meaning this test can be used) for the duration of the COVID-19 declaration under Section 564(b)(1) of the Act, 21 U.S.C. section 360bbb-3(b)(1), unless the authorization is terminated or revoked.  Performed at [facility], [address] [place], [place] [address]     Labs: CBC: Recent Labs  Lab 01/21/23 1500 01/22/23 0634  WBC 7.3 5.6  NEUTROABS 5.8 5.1  HGB 12.2* 11.8*  HCT 37.5* 35.5*  MCV 94.2 89.4  PLT 59* 46*   Basic Metabolic Panel: Recent Labs  Lab 01/21/23 1610 01/21/23 2046 01/22/23 0637 01/23/23 0617 01/24/23 0608  NA 136  --  135 138 135  K 4.8  --  4.8 5.1 3.3*  CL 95*  --  90* 94* 90*  CO2 21*  --  17* 20* 27  GLUCOSE 115*  --  212* 88 100*  BUN 72*  --  74* 56* 32*  CREATININE 8.14*  --  8.78* 6.92* 5.07*  CALCIUM 8.9  --  8.5* 8.9 8.7*  MG  --   --  2.0  --   --   PHOS  --  6.9* 6.7* 5.8*  --    Liver Function Tests: Recent Labs  Lab 01/21/23 1610 01/22/23 0637 01/23/23 0617  AST 29 23  --   ALT 22 18  --   ALKPHOS 86 75  --   BILITOT 0.8 0.8  --   PROT 8.2* 7.6  --   ALBUMIN 3.3* 3.0* 2.9*   CBG: Recent Labs  Lab 01/23/23 1123 01/23/23 1647 01/23/23 2100 01/23/23 2117 01/24/23 0606  GLUCAP 74 154* 184* 165* 86    Discharge time spent: greater than 30 minutes.  Signed: Coralie Keens, MD [facility] 01/24/2023

## 2023-01-24 NOTE — Progress Notes (Signed)
[facility] Progress Note  Subjective: pt seen in room, is more alert and interactive today. HD yest w/ 2.8 L off. BP's stable in the 90s-100s, one drop into 80's.   Vitals:   01/24/23 0053 01/24/23 0123 01/24/23 0447 01/24/23 0715  BP: (!) 95/57 (!) 106/55 113/61 91/62  Pulse: 80 84 91 89  Resp: 17 14 14 18   Temp:  98.5 F (36.9 C) 98.4 F (36.9 C) 98.7 F (37.1 C)  TempSrc:  Oral Oral   SpO2: (!) 85% 90% 99% 93%  Weight:      Height:        Exam: Gen alert and responsive On room air Sclera anicteric, throat clear  No jvd or bruits Chest clear bilat to bases RRR no MRG Abd soft ntnd no mass or ascites +bs Ext 2+ diffuse bilat LE edema Neuro is alert, Ox 3 , nf    RUA AVG+bruit       Home meds include - midodrine 10mg , renvela 800 tid, allopurinol, atrovastatin, donepezil, synthroid, loratadine, MVI, omeprazole, sertraline        OP HD: TTS NW  3.5h   400/1.5  81.5kg  2/2.5 bath  AVG   Heparin 4000 - last OP HD 8/03, post wt 86.5kg - coming off 3-6kg over dry wt for last 2 wks - rocaltrol 1.5 mcg po three times per week - sensipar 60mg  po three times per week       Assessment/ Plan: Volume overload / chronic hypotension - pt w/ long hx CHF, chronic hypotension now w/ difficulty getting fluid off at outpatient dialysis. Midodrine started at 5 mg tid. Hopefully this can help. Getting fluid off w/ HD, weights aren't dropping though. Have switched to renal diet w/ fluid restriction. HD again today.  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 HD today off schedule. OK for dc after HD today.  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 Cont po vdra and sensipar. Cont renvela as binder.  Dementia  DNR  Dispo - for dc today after HD     Vinson Moselle MD  [facility] 01/24/2023, 9:09 AM  Recent Labs  Lab 01/21/23 1500 01/21/23 1610 01/22/23 0634 01/22/23 0637 01/23/23 0617 01/24/23 0608  HGB 12.2*  --  11.8*  --   --   --   ALBUMIN  --    < >   --  3.0* 2.9*  --   CALCIUM  --    < >  --  8.5* 8.9 8.7*  PHOS  --    < >  --  6.7* 5.8*  --   CREATININE  --    < >  --  8.78* 6.92* 5.07*  K  --    < >  --  4.8 5.1 3.3*   < > = values in this interval not displayed.   Recent Labs  Lab 01/21/23 2046  IRON 217*  TIBC 280  FERRITIN 984*   Inpatient medications:  atorvastatin  20 mg Oral Daily   [START ON 01/25/2023] calcitRIOL  1.5 mcg Oral Q T,Th,Sat-1800   Chlorhexidine Gluconate Cloth  6 each Topical Q0600   [START ON 01/25/2023] cinacalcet  60 mg Oral Q T,Th,Sat-1800   donepezil  10 mg Oral QHS   insulin aspart  0-6 Units Subcutaneous TID WC   levothyroxine  50 mcg Oral Daily   loratadine  10 mg Oral Daily   midodrine  5 mg Oral  TID WC   mupirocin ointment  1 Application Topical BID   pantoprazole  40 mg Oral Daily   sertraline  100 mg Oral Daily   sevelamer carbonate  800 mg Oral TID WC   sodium chloride flush  3 mL Intravenous Q12H    sodium chloride     sodium chloride, acetaminophen **OR** acetaminophen, glucagon (human recombinant), ondansetron, sodium chloride flush

## 2023-01-24 NOTE — Progress Notes (Signed)
Hypoglycemic Event  CBG: 66   2100  Treatment: 4 oz juice/soda  Symptoms: None  Follow-up CBG: Time:116 CBG Result:2132  Possible Reasons for Event: Inadequate meal intake  Comments/MD notified:J. Howerter     Moishe Spice

## 2023-01-24 NOTE — Plan of Care (Signed)

## 2023-01-24 NOTE — Progress Notes (Signed)
Mobility Specialist: Progress Note   01/24/23 1056  Mobility  Activity Ambulated with assistance in room  Assistive Device Other (Comment) (HHA)  Distance Ambulated (ft) 20 ft  Activity Response Tolerated well  Mobility Referral Yes  $Mobility charge 1 Mobility  Mobility Specialist Start Time (ACUTE ONLY) 1015  Mobility Specialist Stop Time (ACUTE ONLY) 1025  Mobility Specialist Time Calculation (min) (ACUTE ONLY) 10 min    Pt was agreeable to mobility session - received in bed with family member/visitor in room. Required minA with ambulation and STS. During ambulation pt required only HHA with mobility specialist and family member/visitor. Would recommend walker for next session d/t poor posture and strong reliance on HHA for balance. Ambulation limited d/t pt feeling fatigued. Pt was able to return to chair without fault. Left in chair with all needs met, family/visitor in room.   Maurene Capes Mobility Specialist Please contact via SecureChat or Rehab office at [phone]

## 2023-01-24 NOTE — Progress Notes (Signed)
Notified Dr. Ella Jubilee that HD RN reported that patient's BP dropped while in HD, resulting in stopping at 2L. Order for discharge modified.

## 2023-01-24 NOTE — Progress Notes (Signed)
Patient transported to HD via bed by transporter at 1245 on 01/24/23.

## 2023-01-24 NOTE — TOC Transition Note (Addendum)
Transition of Care [facility]) - CM/SW Discharge Note   Patient Details  Name: Tyler Hahn MRN: [id number] Date of Birth: [birth year]  [facility]) CM/SW Contact:  Leone Haven, RN Phone Number: 01/24/2023, 1:09 PM   Clinical Narrative:    Patient is for dc today, NCM notified Kandee Keen with [place].  Son will transport him home. TOC to fill meds. Added SW to [facility] services to look into memory care options in the future.   Final next level of care: Home w Home Health Services Barriers to Discharge: Continued Medical Work up   Patient Goals and CMS Choice   Choice offered to / list presented to : Adult Children  Discharge Placement                         Discharge Plan and Services Additional resources added to the After Visit Summary for   In-house Referral: NA Discharge Planning Services: CM Consult Post Acute Care Choice: Home Health          DME Arranged: N/A DME Agency: NA       HH Arranged: PT, OT HH Agency: [facility] Date [facility] Agency Contacted: 01/23/23 Time HH Agency Contacted: 1303 Representative spoke with at [facility] Agency: Kandee Keen  Social Determinants of Health (SDOH) Interventions SDOH Screenings   Food Insecurity: No Food Insecurity (01/21/2023)  Housing: Low Risk  (01/21/2023)  Transportation Needs: No Transportation Needs (01/21/2023)  Utilities: Not At Risk (01/21/2023)  Alcohol Screen: Low Risk  (12/04/2022)  Depression (PHQ2-9): Medium Risk (01/16/2023)  Financial Resource Strain: Low Risk  (12/04/2022)  Physical Activity: Insufficiently Active (12/04/2022)  Social Connections: Moderately Integrated (12/04/2022)  Stress: No Stress Concern Present (12/04/2022)  Tobacco Use: Medium Risk (01/21/2023)     Readmission Risk Interventions    01/23/2023   12:57 PM  Readmission Risk Prevention Plan  Transportation Screening Complete  PCP or Specialist Appt within 5-7 Days --  Home Care Screening Complete  Medication Review (RN CM)  Complete

## 2023-01-25 DIAGNOSIS — N186 End stage renal disease: Secondary | ICD-10-CM | POA: Diagnosis not present

## 2023-01-25 DIAGNOSIS — I5023 Acute on chronic systolic (congestive) heart failure: Secondary | ICD-10-CM | POA: Diagnosis not present

## 2023-01-25 DIAGNOSIS — I10 Essential (primary) hypertension: Secondary | ICD-10-CM | POA: Diagnosis not present

## 2023-01-25 LAB — BASIC METABOLIC PANEL
Anion gap: 18 — ABNORMAL HIGH (ref 5–15)
BUN: 47 mg/dL — ABNORMAL HIGH (ref 8–23)
CO2: 22 mmol/L (ref 22–32)
Calcium: 8.2 mg/dL — ABNORMAL LOW (ref 8.9–10.3)
Chloride: 91 mmol/L — ABNORMAL LOW (ref 98–111)
Creatinine, Ser: 5.47 mg/dL — ABNORMAL HIGH (ref 0.61–1.24)
GFR, Estimated: 10 mL/min — ABNORMAL LOW (ref >60)
Glucose, Bld: 183 mg/dL — ABNORMAL HIGH (ref 70–99)
Potassium: 4.3 mmol/L (ref 3.5–5.1)
Sodium: 131 mmol/L — ABNORMAL LOW (ref 135–145)

## 2023-01-25 LAB — CBC WITH DIFFERENTIAL/PLATELET
Abs Immature Granulocytes: 0.06 10*3/uL (ref 0.00–0.07)
Basophils Absolute: 0 10*3/uL (ref 0.0–0.1)
Basophils Relative: 1 %
Eosinophils Absolute: 0.2 10*3/uL (ref 0.0–0.5)
Eosinophils Relative: 3 %
HCT: 34.8 % — ABNORMAL LOW (ref 39.0–52.0)
Hemoglobin: 11.2 g/dL — ABNORMAL LOW (ref 13.0–17.0)
Immature Granulocytes: 1 %
Lymphocytes Relative: 8 %
Lymphs Abs: 0.5 10*3/uL — ABNORMAL LOW (ref 0.7–4.0)
MCH: 29.4 pg (ref 26.0–34.0)
MCHC: 32.2 g/dL (ref 30.0–36.0)
MCV: 91.3 fL (ref 80.0–100.0)
Monocytes Absolute: 0.6 10*3/uL (ref 0.1–1.0)
Monocytes Relative: 10 %
Neutro Abs: 4.9 10*3/uL (ref 1.7–7.7)
Neutrophils Relative %: 77 %
Platelets: 103 10*3/uL — ABNORMAL LOW (ref 150–400)
RBC: 3.81 MIL/uL — ABNORMAL LOW (ref 4.22–5.81)
RDW: 20.9 % — ABNORMAL HIGH (ref 11.5–15.5)
WBC: 6.3 10*3/uL (ref 4.0–10.5)
nRBC: 2.7 % — ABNORMAL HIGH (ref 0.0–0.2)

## 2023-01-25 LAB — GLUCOSE, CAPILLARY
Glucose-Capillary: 112 mg/dL — ABNORMAL HIGH (ref 70–99)
Glucose-Capillary: 208 mg/dL — ABNORMAL HIGH (ref 70–99)
Glucose-Capillary: 97 mg/dL (ref 70–99)

## 2023-01-25 MED ORDER — HEPARIN SODIUM (PORCINE) 1000 UNIT/ML DIALYSIS
4000.0000 [IU] | Freq: Once | INTRAMUSCULAR | Status: AC
  Administered 2023-01-25: 4000 [IU] via INTRAVENOUS_CENTRAL
  Filled 2023-01-25: qty 4

## 2023-01-25 MED ORDER — BISACODYL 5 MG PO TBEC: 5.0000 mg | DELAYED_RELEASE_TABLET | Freq: Every day | ORAL | Status: DC | PRN

## 2023-01-25 MED ORDER — DOXYCYCLINE HYCLATE 100 MG PO TABS
100.0000 mg | ORAL_TABLET | Freq: Two times a day (BID) | ORAL | Status: DC
  Administered 2023-01-25: 100 mg via ORAL
  Filled 2023-01-25: qty 1

## 2023-01-25 MED ORDER — CHLORHEXIDINE GLUCONATE CLOTH 2 % EX PADS: 6.0000 | MEDICATED_PAD | Freq: Every day | CUTANEOUS | Status: DC

## 2023-01-25 NOTE — Plan of Care (Signed)
Patient alert and oriented x 2. Per pt personal sitter pt baseline is slight confusion. Noted on chart hx of dementia. BP stable. MAP above 65. Med compliant. Denies pain. Transition care consult placed due to pending d/c and family is requesting assistance with changing pt dialysis center. They feel currently center isn't attempting to reorient pt and encourage him to continue his treatments. BS dropped during the beginning of shift. Pt was eating supper at the time. BS was 66. Rechecked in 30 min and bs 116. On call provider notified.  Problem: Education: Goal: Ability to describe self-care measures that may prevent or decrease complications (Diabetes Survival Skills Education) will improve Outcome: Progressing Goal: Individualized Educational Video(s) Outcome: Progressing   Problem: Coping: Goal: Ability to adjust to condition or change in health will improve Outcome: Progressing   Problem: Fluid Volume: Goal: Ability to maintain a balanced intake and output will improve Outcome: Progressing   Problem: Health Behavior/Discharge Planning: Goal: Ability to identify and utilize available resources and services will improve Outcome: Progressing Goal: Ability to manage health-related needs will improve Outcome: Progressing   Problem: Metabolic: Goal: Ability to maintain appropriate glucose levels will improve Outcome: Progressing   Problem: Nutritional: Goal: Maintenance of adequate nutrition will improve Outcome: Progressing Goal: Progress toward achieving an optimal weight will improve Outcome: Progressing   Problem: Skin Integrity: Goal: Risk for impaired skin integrity will decrease Outcome: Progressing   Problem: Tissue Perfusion: Goal: Adequacy of tissue perfusion will improve Outcome: Progressing   Problem: Education: Goal: Knowledge of General Education information will improve Description: Including pain rating scale, medication(s)/side effects and non-pharmacologic  comfort measures Outcome: Progressing   Problem: Health Behavior/Discharge Planning: Goal: Ability to manage health-related needs will improve Outcome: Progressing   Problem: Clinical Measurements: Goal: Ability to maintain clinical measurements within normal limits will improve Outcome: Progressing Goal: Will remain free from infection Outcome: Progressing Goal: Diagnostic test results will improve Outcome: Progressing Goal: Respiratory complications will improve Outcome: Progressing Goal: Cardiovascular complication will be avoided Outcome: Progressing   Problem: Activity: Goal: Risk for activity intolerance will decrease Outcome: Progressing   Problem: Nutrition: Goal: Adequate nutrition will be maintained Outcome: Progressing   Problem: Coping: Goal: Level of anxiety will decrease Outcome: Progressing   Problem: Elimination: Goal: Will not experience complications related to bowel motility Outcome: Progressing Goal: Will not experience complications related to urinary retention Outcome: Progressing   Problem: Pain Managment: Goal: General experience of comfort will improve Outcome: Progressing   Problem: Safety: Goal: Ability to remain free from injury will improve Outcome: Progressing   Problem: Skin Integrity: Goal: Risk for impaired skin integrity will decrease Outcome: Progressing

## 2023-01-25 NOTE — Progress Notes (Signed)
PT Cancellation Note  Patient Details Name: Tyler Hahn MRN: [id number] DOB: 02/28/1941   Cancelled Treatment:    Reason Eval/Treat Not Completed: Patient at procedure or test/unavailable.  Pt has just been transport off the unit to HD.  Pt to d/c today and therapy will likely not be able to see pt before that time. 01/25/2023  Jacinto Halim., PT Acute Rehabilitation Services [phone]  (office)   Eliseo Gum  01/25/2023, 1:26 PM

## 2023-01-25 NOTE — TOC Transition Note (Signed)
Transition of Care [facility]) - CM/SW Discharge Note   Patient Details  Name: MASON MCELHONE MRN: [id number] Date of Birth: 30-Jan-1941  [facility]) CM/SW Contact:  Lawerance Sabal, RN Phone Number: 01/25/2023, 1:09 PM   Clinical Narrative:     Notified HH agency that patient will DC today   Final next level of care: Home w Home Health Services Barriers to Discharge: Continued Medical Work up   Patient Goals and CMS Choice   Choice offered to / list presented to : Adult Children  Discharge Placement                         Discharge Plan and Services Additional resources added to the After Visit Summary for   In-house Referral: NA Discharge Planning Services: CM Consult Post Acute Care Choice: Home Health          DME Arranged: N/A DME Agency: NA       HH Arranged: PT, OT HH Agency: [facility] Date [facility] Agency Contacted: 01/23/23 Time HH Agency Contacted: 1303 Representative spoke with at [facility] Agency: Kandee Keen  Social Determinants of Health (SDOH) Interventions SDOH Screenings   Food Insecurity: No Food Insecurity (01/21/2023)  Housing: Low Risk  (01/21/2023)  Transportation Needs: No Transportation Needs (01/21/2023)  Utilities: Not At Risk (01/21/2023)  Alcohol Screen: Low Risk  (12/04/2022)  Depression (PHQ2-9): Medium Risk (01/16/2023)  Financial Resource Strain: Low Risk  (12/04/2022)  Physical Activity: Insufficiently Active (12/04/2022)  Social Connections: Moderately Integrated (12/04/2022)  Stress: No Stress Concern Present (12/04/2022)  Tobacco Use: Medium Risk (01/21/2023)     Readmission Risk Interventions    01/23/2023   12:57 PM  Readmission Risk Prevention Plan  Transportation Screening Complete  PCP or Specialist Appt within 5-7 Days --  Home Care Screening Complete  Medication Review (RN CM) Complete

## 2023-01-25 NOTE — Progress Notes (Signed)
Received patient in bed to unit.  Alert and oriented.  Informed consent signed and in chart.   TX duration:3  Patient tolerated well.  Transported back to the room  Alert, without acute distress.  Hand-off given to patient's nurse.   Access used: right AVG Access issues: none  Total UF removed: 3L Medication(s) given: none   01/25/23 1702  Vitals  Temp 97.7 F (36.5 C)  Temp Source Oral  BP (!) 113/56  MAP (mmHg) 73  BP Location Left Arm  BP Method Automatic  Patient Position (if appropriate) Lying  Pulse Rate 89  Pulse Rate Source Monitor  ECG Heart Rate 89  Resp 18  Oxygen Therapy  SpO2 98 %  O2 Device Room Air  During Treatment Monitoring  Blood Flow Rate (mL/min) 0 mL/min  Arterial Pressure (mmHg) -141.2 mmHg  Venous Pressure (mmHg) 1.01 mmHg  TMP (mmHg) -26.46 mmHg  Ultrafiltration Rate (mL/min) 1611 mL/min  Dialysate Flow Rate (mL/min) 300 ml/min  HD Safety Checks Performed Yes  Intra-Hemodialysis Comments Tolerated well;Tx completed  Dialysis Fluid Bolus Normal Saline  Bolus Amount (mL) 300 mL  Dialysate Potassium Concentration 3  Dialysate Calcium Concentration 2.5  Post Treatment  Duration of HD Treatment -hour(s) 1.78 hour(s)  Fluid Removed (mL) 778.92 mL     S  [facility]

## 2023-01-25 NOTE — TOC Transition Note (Signed)
 Discharge medications are filled and stored in the [facility] awaiting patient discharge.

## 2023-01-25 NOTE — Progress Notes (Addendum)
Discharge has delayed due to confusion post HD. This am patient is back to his baseline, confirmed by family at the bedside. Patient is constipated and has not slept well last night. Mild hypoglycemia last night.   BP 123/62 (BP Location: Left Arm)   Pulse (!) 101   Temp 98.2 F (36.8 C) (Axillary)   Resp 18   Ht 5\' 7"  (1.702 m)   Wt 82.9 kg   SpO2 97%   BMI 28.62 kg/m   Neurology awake and alert, no agitation, mild confusion, but not lack of attention or disorganized thinking ENT with no pallor Cardiovascular with S1 and S2 present and regular with no gallops Respiratory with no rales or wheezing Abdomen with no distention  Trace lower extremity edema.  DX ESRD on HD  Capillary glucose 112 mg/dl this am, and patient tolerating po well.  Plan for HD today per his regular schedule and discharge home after HD Add dulcolax for constipation.

## 2023-01-25 NOTE — Progress Notes (Signed)
[facility] Progress Note  Subjective: pt seen in room. Going home after HD today.   Vitals:   01/25/23 0138 01/25/23 0424 01/25/23 0756 01/25/23 1110  BP: (!) 95/54 (!) 90/54 123/62 114/62  Pulse: 87 92 (!) 101 98  Resp: 18 18 18 17   Temp: 98.4 F (36.9 C)  98.2 F (36.8 C) 98.1 F (36.7 C)  TempSrc: Oral Oral Axillary Oral  SpO2: 90% 96% 97% 100%  Weight:  82.9 kg    Height:        Exam: Gen alert and responsive On room air Sclera anicteric, throat clear  No jvd or bruits Chest clear bilat to bases RRR no MRG Abd soft ntnd no mass or ascites +bs Ext 1-2+  bilat LE edema Neuro is alert, Ox 3 , nf    RUA AVG+bruit       Home meds include - midodrine 10mg , renvela 800 tid, allopurinol, atrovastatin, donepezil, synthroid, loratadine, MVI, omeprazole, sertraline        OP HD: TTS NW  3.5h   400/1.5  81.5kg  2/2.5 bath  AVG   Heparin 4000 - last OP HD 8/03, post wt 86.5kg - coming off 3-6kg over dry wt for last 2 wks - rocaltrol 1.5 mcg po three times per week - sensipar 60mg  po three times per week       Assessment/ Plan: Volume overload / chronic hypotension - pt w/ long hx CHF, chronic hypotension now w/ difficulty getting fluid off at outpatient dialysis. Midodrine started at 5 mg tid. Hopefully this can help. Getting fluid off w/ HD, wt's are down about 5kg and LE edema has improved, still over dry wt 1-2kg. Have switched to renal diet w/ fluid restriction. HD today.  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 Has had serial HD here. HD today.  MBD ckd - CCa in range, phos a bit high. Cont po vdra and sensipar. Cont renvela as binder.  Dementia  DNR  Dispo - for dc today after HD     Vinson Moselle MD  [facility] 01/25/2023, 1:21 PM  Recent Labs  Lab 01/22/23 0634 01/22/23 0637 01/23/23 0617 01/24/23 0608 01/24/23 1429  HGB 11.8*  --   --   --  11.1*  ALBUMIN  --  3.0* 2.9*  --   --   CALCIUM  --  8.5* 8.9 8.7*  --   PHOS  --  6.7* 5.8*  --   --   CREATININE  --   8.78* 6.92* 5.07*  --   K  --  4.8 5.1 3.3*  --    Recent Labs  Lab 01/21/23 2046  IRON 217*  TIBC 280  FERRITIN 984*   Inpatient medications:  atorvastatin  20 mg Oral Daily   calcitRIOL  1.5 mcg Oral Q T,Th,Sat-1800   Chlorhexidine Gluconate Cloth  6 each Topical Q0600   Chlorhexidine Gluconate Cloth  6 each Topical Q0600   cinacalcet  60 mg Oral Q T,Th,Sat-1800   donepezil  10 mg Oral QHS   doxycycline  100 mg Oral Q12H   heparin  4,000 Units Dialysis Once in dialysis   insulin aspart  0-6 Units Subcutaneous TID WC   levothyroxine  50 mcg Oral Daily   loratadine  10 mg Oral Daily   midodrine  5 mg Oral TID WC   mupirocin ointment  1 Application Topical BID   pantoprazole  40 mg Oral Daily   sertraline  100 mg Oral Daily   sevelamer  carbonate  800 mg Oral TID WC   sodium chloride flush  3 mL Intravenous Q12H    sodium chloride     sodium chloride, acetaminophen **OR** acetaminophen, bisacodyl, dextrose, glucagon (human recombinant), ondansetron, sodium chloride flush

## 2023-01-26 ENCOUNTER — Telehealth: Payer: Self-pay | Admitting: Physician Assistant

## 2023-01-26 NOTE — Telephone Encounter (Signed)
Transition of care contact from inpatient facility  Date of Discharge: 01/25/23 Date of Contact: 01/26/23 Method of contact: Phone  Attempted to contact patient to discuss transition of care from inpatient admission. Patient did not answer the phone. Message was left on the patient's voicemail with call back instructions

## 2023-01-26 NOTE — Discharge Planning (Signed)
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 Was started on midodrine TID    D/C Meds to be reconciled by nurse after every discharge.  Completed By: Rogers Blocker, PA-C 01/26/2023, 11:27 AM  [facility] Pager: [phone]    Reviewed by: MD:______ RN_______

## 2023-01-27 ENCOUNTER — Telehealth: Payer: Self-pay

## 2023-01-27 ENCOUNTER — Telehealth: Payer: Self-pay | Admitting: Family Medicine

## 2023-01-27 ENCOUNTER — Ambulatory Visit: Payer: Self-pay | Admitting: *Deleted

## 2023-01-27 NOTE — Telephone Encounter (Signed)
Delight Ovens [phone]- with VM)  Requesting verbal orders for nursing, 1 week 5 - congestive heart failure   Please advise

## 2023-01-27 NOTE — Progress Notes (Signed)
Late Note Entry- January 27, 2023  Pt was d/c on Saturday. Contacted [facility] this morning to provide pt's d/c date and that pt should resume care tomorrow  Olivia Canter Renal Navigator [phone]

## 2023-01-27 NOTE — Telephone Encounter (Signed)
It is ok to give verbal orders for  Tyler Hahn [phone]- with VM)   Requesting verbal orders for nursing, 1 week 5 - congestive heart failure

## 2023-01-27 NOTE — Chronic Care Management (AMB) (Signed)
   01/27/2023  Tyler Hahn 1940-08-13 [id number]   Patient no longer participating in chronic care management services (CCM), status changed to previously enrolled.  Irving Shows [facility], BSN [place]/ Ambulatory Care Management [phone]

## 2023-01-27 NOTE — Telephone Encounter (Signed)
Ok for verbal orders ?

## 2023-01-27 NOTE — Transitions of Care (Post Inpatient/ED Visit) (Signed)
01/27/2023  Name: Tyler Hahn MRN: [id number] DOB: Nov 10, 1940  Today's TOC FU Call Status: Today's TOC FU Call Status:: Successful TOC FU Call Completed TOC FU Call Complete Date: 01/27/23 (Call completed with caregiver-Joanna(DPR on file))  Transition Care Management Follow-up Telephone Call Date of Discharge: 01/25/23 Discharge Facility: [facility]) Type of Discharge: Inpatient Admission Primary Inpatient Discharge Diagnosis:: "hypervolemia,unspecified" How have you been since you were released from the hospital?: Better (CG voices pt doing well-"feet remain swollen but look better than when he went into hospital"-elevating feet & limiting fluid & salt intake. Appetite good. Denies any pain. Pt at baseline for mental status- for dementia.) Any questions or concerns?: No  Items Reviewed: Did you receive and understand the discharge instructions provided?: Yes Medications obtained,verified, and reconciled?: Yes (Medications Reviewed) Any new allergies since your discharge?: No Dietary orders reviewed?: Yes Type of Diet Ordered:: renal/low salt/heart healthy Do you have support at home?: Yes People in Home: child(ren), adult Name of Support/Comfort Primary Source: son-Jonathan, caregiver in the home to assist with pt needs as well  Medications Reviewed Today: Medications Reviewed Today     Reviewed by Charlyn Minerva, RN (Registered Nurse) on 01/27/23 at 1201  Med List Status: <None>   Medication Order Taking? Sig Documenting Provider Last Dose Status Informant  allopurinol (ZYLOPRIM) 100 MG tablet [id number] Yes TAKE 1 TABLET BY MOUTH 3 TIMES A WEEK ON MONDAY, WEDNESDAY AND FRIDAY Sheliah Hatch, MD Taking Active Care Giver, Pharmacy Records  atorvastatin (LIPITOR) 20 MG tablet [id number] Yes Take 1 tablet (20 mg total) by mouth daily. Sheliah Hatch, MD Taking Active Care Giver, Pharmacy Records  calcitRIOL (ROCALTROL) 0.5 MCG capsule [id number] Yes  Take 3 capsules (1.5 mcg total) by mouth every Tuesday, Thursday, and Saturday at 6 PM. Arrien, York Ram, MD Taking Active   cinacalcet [facility]) 30 MG tablet [id number] Yes Take 2 tablets (60 mg total) by mouth every Tuesday, Thursday, and Saturday at 6 PM. Arrien, York Ram, MD Taking Active   donepezil (ARICEPT) 10 MG tablet [id number] Yes TAKE 1 TABLET BY MOUTH EVERYDAY AT BEDTIME Sheliah Hatch, MD Taking Active Care Giver, Pharmacy Records  doxycycline (VIBRA-TABS) 100 MG tablet [id number] Yes Take 1 tablet (100 mg total) by mouth 2 (two) times daily. Vivi Barrack, DPM Taking Active Care Giver, Pharmacy Records  levothyroxine (SYNTHROID) 50 MCG tablet [id number] Yes Take 1 tablet (50 mcg total) by mouth daily. Sheliah Hatch, MD Taking Active Care Giver, Pharmacy Records  loratadine [facility]) 10 MG tablet [id number] Yes Take 1 tablet (10 mg total) by mouth daily. Sheliah Hatch, MD Taking Active Care Giver, Pharmacy Records  midodrine (PROAMATINE) 5 MG tablet [id number] Yes Take 1 tablet (5 mg total) by mouth 3 (three) times daily with meals. Arrien, York Ram, MD Taking Active   Multiple Vitamins-Minerals (MULTIVITAMIN WITH MINERALS) tablet [id number] Yes Take 1 tablet by mouth daily. [provider] Taking Active Care Giver, Pharmacy Records  mupirocin ointment (BACTROBAN) 2 % [id number] Yes Apply 1 Application topically 2 (two) times daily. Vivi Barrack, DPM Taking Active Care Giver, Pharmacy Records  omeprazole (PRILOSEC) 20 MG capsule [id number] Yes Take 1 capsule (20 mg total) by mouth daily. Sheliah Hatch, MD Taking Active Care Giver, Pharmacy Records  sertraline (ZOLOFT) 100 MG tablet [id number] Yes TAKE 1.5 TABLETS (150MG  TOTAL) BY MOUTH DAILY Sheliah Hatch, MD Taking Active Care Giver, Pharmacy Records  sevelamer carbonate (RENVELA) 800 MG tablet [id number] Yes Take 800  mg by mouth 3 (three) times daily with meals. [provider] Taking Active Care Giver, Pharmacy Records            Home Care and Equipment/Supplies: Were Home Health Services Ordered?: Yes [facility] of [facility] Agency[facility] Has Agency set up a time to come to your home?: Yes First Home Health Visit Date: 01/27/23 (CG confirms staff has called and coming out today for initial visit) Any new equipment or medical supplies ordered?: No  Functional Questionnaire: Do you need assistance with bathing/showering or dressing?: Yes Do you need assistance with meal preparation?: Yes Do you need assistance with eating?: No Do you have difficulty maintaining continence: Yes Do you need assistance with getting out of bed/getting out of a chair/moving?: Yes Do you have difficulty managing or taking your medications?: Yes  Follow up appointments reviewed: PCP Follow-up appointment confirmed?: Yes Date of PCP follow-up appointment?: 01/31/23 Follow-up Provider: Dr. Beverely Low Specialist [facility] Follow-up appointment confirmed?: NA Do you need transportation to your follow-up appointment?: No (CG confirms that she or pt's son takes pt to appts) Do you understand care options if your condition(s) worsen?: Yes-patient verbalized understanding  SDOH Interventions Today    Flowsheet Row Most Recent Value  SDOH Interventions   Food Insecurity Interventions Intervention Not Indicated  Transportation Interventions Intervention Not Indicated      TOC Interventions Today    Flowsheet Row Most Recent Value  TOC Interventions   TOC Interventions Discussed/Reviewed TOC Interventions Discussed      Interventions Today    Flowsheet Row Most Recent Value  Chronic Disease   Chronic disease during today's visit Chronic Kidney Disease/End Stage Renal Disease (ESRD)  General Interventions   General Interventions Discussed/Reviewed General Interventions Discussed, Doctor Visits, Durable Medical Equipment (DME), Referral to Nurse  [follow up appt  scheduled with assigned RN care coordinator-CG agreeable to date and time]  Doctor Visits Discussed/Reviewed Doctor Visits Discussed, PCP, Specialist  Durable Medical Equipment (DME) BP Cuff  [caregiver voices that they are working on getting BP machine to check BP in the home]  PCP/Specialist Visits Compliance with follow-up visit  Education Interventions   Education Provided Provided Education  Provided Verbal Education On Nutrition, When to see the doctor, Other  [sx mgmt, bowel mgmt]  Nutrition Interventions   Nutrition Discussed/Reviewed Nutrition Discussed, Fluid intake, Increasing proteins, Decreasing fats, Decreasing salt, Adding fruits and vegetables  Pharmacy Interventions   Pharmacy Dicussed/Reviewed Pharmacy Topics Discussed, Medications and their functions  Safety Interventions   Safety Discussed/Reviewed Safety Discussed, Home Safety, Fall Risk  Home Safety Assistive Devices  [pt has cane-doesn't use it often-caregiver states she assists pt with walking]  Advanced Directive Interventions   Advanced Directives Discussed/Reviewed End of Life  End of Life Palliative  [caregiver unsure if anyone from [facility] has contacted them yet-will follow up with son]        Antionette Fairy, RN,BSN,CCM [facility] Care Management Care Management Community Coordinator Direct Phone: [phone] Toll Free: [phone] Fax: [phone]

## 2023-01-28 ENCOUNTER — Ambulatory Visit: Payer: Medicare HMO | Admitting: Podiatry

## 2023-01-28 ENCOUNTER — Ambulatory Visit (INDEPENDENT_AMBULATORY_CARE_PROVIDER_SITE_OTHER): Payer: Medicare HMO

## 2023-01-28 ENCOUNTER — Other Ambulatory Visit (HOSPITAL_BASED_OUTPATIENT_CLINIC_OR_DEPARTMENT_OTHER): Payer: Self-pay

## 2023-01-28 ENCOUNTER — Telehealth: Payer: Self-pay | Admitting: Family Medicine

## 2023-01-28 DIAGNOSIS — Z992 Dependence on renal dialysis: Secondary | ICD-10-CM | POA: Diagnosis not present

## 2023-01-28 DIAGNOSIS — M869 Osteomyelitis, unspecified: Secondary | ICD-10-CM

## 2023-01-28 DIAGNOSIS — L97514 Non-pressure chronic ulcer of other part of right foot with necrosis of bone: Secondary | ICD-10-CM

## 2023-01-28 DIAGNOSIS — D689 Coagulation defect, unspecified: Secondary | ICD-10-CM | POA: Diagnosis not present

## 2023-01-28 DIAGNOSIS — N2581 Secondary hyperparathyroidism of renal origin: Secondary | ICD-10-CM | POA: Diagnosis not present

## 2023-01-28 DIAGNOSIS — E876 Hypokalemia: Secondary | ICD-10-CM | POA: Diagnosis not present

## 2023-01-28 DIAGNOSIS — I739 Peripheral vascular disease, unspecified: Secondary | ICD-10-CM

## 2023-01-28 DIAGNOSIS — N186 End stage renal disease: Secondary | ICD-10-CM | POA: Diagnosis not present

## 2023-01-28 MED ORDER — DOXYCYCLINE HYCLATE 100 MG PO TABS
100.0000 mg | ORAL_TABLET | Freq: Two times a day (BID) | ORAL | 0 refills | Status: DC
  Filled 2023-01-28: qty 14, 7d supply, fill #0

## 2023-01-28 NOTE — Telephone Encounter (Signed)
We have a message in already in regards to this and and had to leave a VM

## 2023-01-28 NOTE — Telephone Encounter (Signed)
Provided the verbal orders as requested

## 2023-01-28 NOTE — Telephone Encounter (Signed)
Ok for verbal orders ?

## 2023-01-28 NOTE — Telephone Encounter (Signed)
PT is asking if pt can have PT for 8 weeks once a week

## 2023-01-28 NOTE — Telephone Encounter (Signed)
Left vm to call back

## 2023-01-28 NOTE — Progress Notes (Signed)
Subjective:   Patient ID: Tyler Hahn, male   DOB: 82 y.o.   MRN: [id number]   HPI  82 year old male presents the office today for follow-up evaluation of ulceration to the right second toe.  States he is doing about the same.  He was hospitalized since I saw him last for an unrelated issue.  He started an ice pack on Saturday.  Denies any fevers or chills.  No other concerns.     Review of Systems  All other systems reviewed and are negative.     Objective:  Physical Exam  General: AAO x3, NAD  Dermatological: Ulceration present distal aspect the right second toe there is edema present to right second toe.  There is no probing to bone today.  There is no drainage or pus.  No fluctuation or crepitation and no malodor.  Left hallux toenail is not present.            Vascular: Unable to palpate pulse.  Neruologic: Decreased.  Musculoskeletal: Hammertoes present.     Assessment:   Ulceration, osteomyelitis right second toe; left hallux toenail avulsion     Plan:  -Treatment options discussed including all alternatives, risks, and complications -Etiology of symptoms were discussed -Repeat x-rays were obtained and reviewed with the patient.  3 views right and left foot were obtained.  Cortical changes the distal phalanx of the right second toe consistent with likely osteomyelitis. -Will continue doxycyline -Mupirocin dressing changes daily -Unfortunately had to cancel his vascular appointment given hospitalization.  They will contact to reschedule.  Discussed the importance of this. -ABI -Offloading -Unfortunately likely will need toe amputation. -Monitor for any clinical signs or symptoms of infection and directed to call the office immediately should any occur or go to the ER.  Vivi Barrack DPM

## 2023-01-28 NOTE — Telephone Encounter (Signed)
Called and left detailed VM to approve verbal orders

## 2023-01-28 NOTE — Telephone Encounter (Signed)
[name] from [facility] called regarding pt. Pt was in hospital, Tyler Hahn received a referral from the hospital. [name] saw patient on 01/27/23 and his recommendation is for home physical therapy once a week for 8 weeks.

## 2023-01-28 NOTE — Telephone Encounter (Signed)
Tonya from [facility] - her phone # [phone], calling in reference to VM left regarding pt

## 2023-01-29 ENCOUNTER — Telehealth: Payer: Self-pay

## 2023-01-29 ENCOUNTER — Other Ambulatory Visit: Payer: Self-pay | Admitting: Podiatry

## 2023-01-29 ENCOUNTER — Other Ambulatory Visit (HOSPITAL_BASED_OUTPATIENT_CLINIC_OR_DEPARTMENT_OTHER): Payer: Self-pay

## 2023-01-29 ENCOUNTER — Encounter: Payer: Self-pay | Admitting: Podiatry

## 2023-01-29 MED ORDER — DOXYCYCLINE HYCLATE 100 MG PO TABS: 100.0000 mg | ORAL_TABLET | Freq: Two times a day (BID) | ORAL | 0 refills | Status: DC

## 2023-01-29 NOTE — Telephone Encounter (Signed)
Home Health Physical Therapist called and left a message yesterday - Is patient Tyler Hahn? Or is PT limited to ROM? He also asked if the patient is supposed to be wearing his post op shoe Please advise or call Rosanne Ashing @ [phone] Thanks

## 2023-01-30 DIAGNOSIS — Z992 Dependence on renal dialysis: Secondary | ICD-10-CM | POA: Diagnosis not present

## 2023-01-30 DIAGNOSIS — E876 Hypokalemia: Secondary | ICD-10-CM | POA: Diagnosis not present

## 2023-01-30 DIAGNOSIS — E8770 Fluid overload, unspecified: Secondary | ICD-10-CM | POA: Diagnosis not present

## 2023-01-30 DIAGNOSIS — N186 End stage renal disease: Secondary | ICD-10-CM | POA: Diagnosis not present

## 2023-01-30 DIAGNOSIS — D689 Coagulation defect, unspecified: Secondary | ICD-10-CM | POA: Diagnosis not present

## 2023-01-30 DIAGNOSIS — N2581 Secondary hyperparathyroidism of renal origin: Secondary | ICD-10-CM | POA: Diagnosis not present

## 2023-01-31 ENCOUNTER — Telehealth: Payer: Self-pay

## 2023-01-31 ENCOUNTER — Telehealth: Payer: Self-pay | Admitting: Family Medicine

## 2023-01-31 ENCOUNTER — Ambulatory Visit (INDEPENDENT_AMBULATORY_CARE_PROVIDER_SITE_OTHER): Payer: Medicare HMO | Admitting: Family Medicine

## 2023-01-31 ENCOUNTER — Encounter: Payer: Self-pay | Admitting: Family Medicine

## 2023-01-31 VITALS — BP 116/64 | HR 96 | Temp 98.0°F | Resp 20 | Ht 67.0 in | Wt 181.2 lb

## 2023-01-31 DIAGNOSIS — N186 End stage renal disease: Secondary | ICD-10-CM | POA: Diagnosis not present

## 2023-01-31 DIAGNOSIS — I5023 Acute on chronic systolic (congestive) heart failure: Secondary | ICD-10-CM | POA: Diagnosis not present

## 2023-01-31 LAB — CBC WITH DIFFERENTIAL/PLATELET
Basophils Absolute: 0.2 10*3/uL — ABNORMAL HIGH (ref 0.0–0.1)
Basophils Relative: 1.9 % (ref 0.0–3.0)
Eosinophils Absolute: 0.2 10*3/uL (ref 0.0–0.7)
Eosinophils Relative: 2 % (ref 0.0–5.0)
HCT: 41.7 % (ref 39.0–52.0)
Hemoglobin: 13.1 g/dL (ref 13.0–17.0)
Lymphocytes Relative: 7.9 % — ABNORMAL LOW (ref 12.0–46.0)
Lymphs Abs: 0.7 10*3/uL (ref 0.7–4.0)
MCHC: 31.6 g/dL (ref 30.0–36.0)
MCV: 95.8 fl (ref 78.0–100.0)
Monocytes Absolute: 1 10*3/uL (ref 0.1–1.0)
Monocytes Relative: 12.3 % — ABNORMAL HIGH (ref 3.0–12.0)
Neutro Abs: 6.3 10*3/uL (ref 1.4–7.7)
Neutrophils Relative %: 75.9 % (ref 43.0–77.0)
Platelets: 134 10*3/uL — ABNORMAL LOW (ref 150.0–400.0)
RBC: 4.35 Mil/uL (ref 4.22–5.81)
RDW: 21.5 % — ABNORMAL HIGH (ref 11.5–15.5)
WBC: 8.3 10*3/uL (ref 4.0–10.5)

## 2023-01-31 LAB — BASIC METABOLIC PANEL
BUN: 40 mg/dL — ABNORMAL HIGH (ref 6–23)
CO2: 27 mEq/L (ref 19–32)
Calcium: 10.3 mg/dL (ref 8.4–10.5)
Chloride: 87 mEq/L — ABNORMAL LOW (ref 96–112)
Creatinine, Ser: 5.52 mg/dL (ref 0.40–1.50)
GFR: 9.13 mL/min — CL (ref >60.00)
Glucose, Bld: 132 mg/dL — ABNORMAL HIGH (ref 70–99)
Potassium: 4.5 mEq/L (ref 3.5–5.1)
Sodium: 133 mEq/L — ABNORMAL LOW (ref 135–145)

## 2023-01-31 NOTE — Telephone Encounter (Signed)
Forms placed in Dr Tabori to be signed folder  

## 2023-01-31 NOTE — Patient Instructions (Signed)
Follow up as needed or as scheduled We'll notify you of your lab results and make any changes if needed They should reach out to schedule a home palliative care visit Call with any questions or concerns- especially if we can help in any way Hang in there!

## 2023-01-31 NOTE — Telephone Encounter (Signed)
Saa from Lab called with Critical lab value Patients Creatinine is 5.52 and GFR 9.13

## 2023-01-31 NOTE — Progress Notes (Unsigned)
   Subjective:    Patient ID: Tyler Hahn, male    DOB: [birth year], 82 y.o.   MRN: [id number]  HPI Hospital f/u- pt was admitted 8/6-8/9 due to volume overload.  He presented w/ generalized edema but was not able to complete HD due to cramping and hypotension.  He was started on Midodrine to support BP and had ultrafiltration to remove excess fluid.  ECHO showed EF of 25-30% w/ severe dilation of ventricle.  CT showed solid pulmonary nodule of RML that was unchanged from previous.  While he was hospitalized, they met w/ palliative and turned off pt's ICD.  Pt reports legs are not as tight but at night complains of aching pain that keeps him awake.  Meds reconciled.  H&P, consult notes, progress notes, labs, imaging, and D/C summary reviewed.   Review of Systems For ROS see HPI     Objective:   Physical Exam Vitals reviewed.  Constitutional:      General: He is not in acute distress.    Appearance: Normal appearance. He is not ill-appearing.  HENT:     Head: Normocephalic and atraumatic.  Cardiovascular:     Rate and Rhythm: Normal rate and regular rhythm.  Pulmonary:     Effort: Pulmonary effort is normal.     Comments: Faint crackles at bases bilaterally Abdominal:     General: There is no distension.     Palpations: Abdomen is soft.     Tenderness: There is no abdominal tenderness. There is no guarding or rebound.  Musculoskeletal:     Right lower leg: Edema present.     Left lower leg: Edema present.  Skin:    General: Skin is warm and dry.  Neurological:     Mental Status: He is alert. Mental status is at baseline.           Assessment & Plan:

## 2023-01-31 NOTE — Telephone Encounter (Signed)
Received forms from [facility] Printed & placed in provider bin

## 2023-02-01 ENCOUNTER — Emergency Department (HOSPITAL_COMMUNITY): Payer: Medicare HMO

## 2023-02-01 ENCOUNTER — Encounter (HOSPITAL_COMMUNITY): Payer: Self-pay

## 2023-02-01 ENCOUNTER — Other Ambulatory Visit: Payer: Self-pay

## 2023-02-01 ENCOUNTER — Inpatient Hospital Stay (HOSPITAL_COMMUNITY): Payer: Medicare HMO

## 2023-02-01 ENCOUNTER — Inpatient Hospital Stay (HOSPITAL_COMMUNITY)
Admission: EM | Admit: 2023-02-01 | Discharge: 2023-02-05 | DRG: 441 | Disposition: A | Discharge status: Home Health | Payer: Medicare HMO | Attending: Internal Medicine | Admitting: Internal Medicine

## 2023-02-01 DIAGNOSIS — I6782 Cerebral ischemia: Secondary | ICD-10-CM | POA: Diagnosis not present

## 2023-02-01 DIAGNOSIS — Z841 Family history of disorders of kidney and ureter: Secondary | ICD-10-CM

## 2023-02-01 DIAGNOSIS — Z992 Dependence on renal dialysis: Secondary | ICD-10-CM | POA: Diagnosis not present

## 2023-02-01 DIAGNOSIS — E1169 Type 2 diabetes mellitus with other specified complication: Secondary | ICD-10-CM | POA: Diagnosis present

## 2023-02-01 DIAGNOSIS — Z820 Family history of epilepsy and other diseases of the nervous system: Secondary | ICD-10-CM

## 2023-02-01 DIAGNOSIS — I6523 Occlusion and stenosis of bilateral carotid arteries: Secondary | ICD-10-CM | POA: Diagnosis not present

## 2023-02-01 DIAGNOSIS — E11621 Type 2 diabetes mellitus with foot ulcer: Secondary | ICD-10-CM | POA: Diagnosis present

## 2023-02-01 DIAGNOSIS — G9389 Other specified disorders of brain: Secondary | ICD-10-CM | POA: Diagnosis not present

## 2023-02-01 DIAGNOSIS — N2581 Secondary hyperparathyroidism of renal origin: Secondary | ICD-10-CM | POA: Diagnosis present

## 2023-02-01 DIAGNOSIS — Z882 Allergy status to sulfonamides status: Secondary | ICD-10-CM

## 2023-02-01 DIAGNOSIS — I5022 Chronic systolic (congestive) heart failure: Secondary | ICD-10-CM

## 2023-02-01 DIAGNOSIS — R070 Pain in throat: Secondary | ICD-10-CM | POA: Diagnosis not present

## 2023-02-01 DIAGNOSIS — N250 Renal osteodystrophy: Secondary | ICD-10-CM | POA: Diagnosis not present

## 2023-02-01 DIAGNOSIS — E1165 Type 2 diabetes mellitus with hyperglycemia: Secondary | ICD-10-CM | POA: Diagnosis present

## 2023-02-01 DIAGNOSIS — E785 Hyperlipidemia, unspecified: Secondary | ICD-10-CM | POA: Diagnosis present

## 2023-02-01 DIAGNOSIS — K760 Fatty (change of) liver, not elsewhere classified: Secondary | ICD-10-CM | POA: Diagnosis present

## 2023-02-01 DIAGNOSIS — Z515 Encounter for palliative care: Secondary | ICD-10-CM

## 2023-02-01 DIAGNOSIS — D631 Anemia in chronic kidney disease: Secondary | ICD-10-CM | POA: Diagnosis present

## 2023-02-01 DIAGNOSIS — I959 Hypotension, unspecified: Secondary | ICD-10-CM | POA: Diagnosis not present

## 2023-02-01 DIAGNOSIS — Z79890 Hormone replacement therapy (postmenopausal): Secondary | ICD-10-CM | POA: Diagnosis not present

## 2023-02-01 DIAGNOSIS — Z743 Need for continuous supervision: Secondary | ICD-10-CM | POA: Diagnosis not present

## 2023-02-01 DIAGNOSIS — E11649 Type 2 diabetes mellitus with hypoglycemia without coma: Secondary | ICD-10-CM | POA: Diagnosis not present

## 2023-02-01 DIAGNOSIS — F0390 Unspecified dementia without behavioral disturbance: Secondary | ICD-10-CM | POA: Diagnosis present

## 2023-02-01 DIAGNOSIS — R748 Abnormal levels of other serum enzymes: Secondary | ICD-10-CM | POA: Diagnosis not present

## 2023-02-01 DIAGNOSIS — I5042 Chronic combined systolic (congestive) and diastolic (congestive) heart failure: Secondary | ICD-10-CM | POA: Diagnosis present

## 2023-02-01 DIAGNOSIS — F03B0 Unspecified dementia, moderate, without behavioral disturbance, psychotic disturbance, mood disturbance, and anxiety: Secondary | ICD-10-CM

## 2023-02-01 DIAGNOSIS — K7682 Hepatic encephalopathy: Principal | ICD-10-CM | POA: Diagnosis present

## 2023-02-01 DIAGNOSIS — M2041 Other hammer toe(s) (acquired), right foot: Secondary | ICD-10-CM | POA: Diagnosis not present

## 2023-02-01 DIAGNOSIS — Z66 Do not resuscitate: Secondary | ICD-10-CM | POA: Diagnosis not present

## 2023-02-01 DIAGNOSIS — K859 Acute pancreatitis without necrosis or infection, unspecified: Secondary | ICD-10-CM | POA: Diagnosis not present

## 2023-02-01 DIAGNOSIS — E039 Hypothyroidism, unspecified: Secondary | ICD-10-CM | POA: Diagnosis present

## 2023-02-01 DIAGNOSIS — M109 Gout, unspecified: Secondary | ICD-10-CM | POA: Diagnosis present

## 2023-02-01 DIAGNOSIS — R278 Other lack of coordination: Secondary | ICD-10-CM | POA: Diagnosis present

## 2023-02-01 DIAGNOSIS — R4182 Altered mental status, unspecified: Principal | ICD-10-CM

## 2023-02-01 DIAGNOSIS — Z87891 Personal history of nicotine dependence: Secondary | ICD-10-CM

## 2023-02-01 DIAGNOSIS — D696 Thrombocytopenia, unspecified: Secondary | ICD-10-CM | POA: Diagnosis not present

## 2023-02-01 DIAGNOSIS — Z95810 Presence of automatic (implantable) cardiac defibrillator: Secondary | ICD-10-CM

## 2023-02-01 DIAGNOSIS — I9589 Other hypotension: Secondary | ICD-10-CM | POA: Diagnosis not present

## 2023-02-01 DIAGNOSIS — J9601 Acute respiratory failure with hypoxia: Secondary | ICD-10-CM | POA: Diagnosis present

## 2023-02-01 DIAGNOSIS — E8779 Other fluid overload: Secondary | ICD-10-CM | POA: Diagnosis not present

## 2023-02-01 DIAGNOSIS — F419 Anxiety disorder, unspecified: Secondary | ICD-10-CM | POA: Diagnosis present

## 2023-02-01 DIAGNOSIS — R0989 Other specified symptoms and signs involving the circulatory and respiratory systems: Secondary | ICD-10-CM | POA: Diagnosis not present

## 2023-02-01 DIAGNOSIS — Z833 Family history of diabetes mellitus: Secondary | ICD-10-CM

## 2023-02-01 DIAGNOSIS — Z79899 Other long term (current) drug therapy: Secondary | ICD-10-CM

## 2023-02-01 DIAGNOSIS — Z800 Family history of malignant neoplasm of digestive organs: Secondary | ICD-10-CM

## 2023-02-01 DIAGNOSIS — L89899 Pressure ulcer of other site, unspecified stage: Secondary | ICD-10-CM | POA: Diagnosis not present

## 2023-02-01 DIAGNOSIS — I132 Hypertensive heart and chronic kidney disease with heart failure and with stage 5 chronic kidney disease, or end stage renal disease: Secondary | ICD-10-CM | POA: Diagnosis not present

## 2023-02-01 DIAGNOSIS — N186 End stage renal disease: Secondary | ICD-10-CM | POA: Diagnosis not present

## 2023-02-01 DIAGNOSIS — R7989 Other specified abnormal findings of blood chemistry: Secondary | ICD-10-CM | POA: Diagnosis present

## 2023-02-01 DIAGNOSIS — N261 Atrophy of kidney (terminal): Secondary | ICD-10-CM | POA: Diagnosis not present

## 2023-02-01 DIAGNOSIS — M7989 Other specified soft tissue disorders: Secondary | ICD-10-CM | POA: Diagnosis not present

## 2023-02-01 DIAGNOSIS — K219 Gastro-esophageal reflux disease without esophagitis: Secondary | ICD-10-CM | POA: Diagnosis present

## 2023-02-01 DIAGNOSIS — Z7189 Other specified counseling: Secondary | ICD-10-CM | POA: Diagnosis not present

## 2023-02-01 DIAGNOSIS — Z8249 Family history of ischemic heart disease and other diseases of the circulatory system: Secondary | ICD-10-CM

## 2023-02-01 DIAGNOSIS — I120 Hypertensive chronic kidney disease with stage 5 chronic kidney disease or end stage renal disease: Secondary | ICD-10-CM | POA: Diagnosis not present

## 2023-02-01 DIAGNOSIS — E1122 Type 2 diabetes mellitus with diabetic chronic kidney disease: Secondary | ICD-10-CM | POA: Diagnosis present

## 2023-02-01 DIAGNOSIS — J9811 Atelectasis: Secondary | ICD-10-CM | POA: Diagnosis not present

## 2023-02-01 DIAGNOSIS — R54 Age-related physical debility: Secondary | ICD-10-CM | POA: Diagnosis present

## 2023-02-01 DIAGNOSIS — R9431 Abnormal electrocardiogram [ECG] [EKG]: Secondary | ICD-10-CM | POA: Diagnosis not present

## 2023-02-01 LAB — GLUCOSE, CAPILLARY
Glucose-Capillary: 110 mg/dL — ABNORMAL HIGH (ref 70–99)
Glucose-Capillary: 18 mg/dL — CL (ref 70–99)

## 2023-02-01 LAB — CBC
HCT: 40.9 % (ref 39.0–52.0)
Hemoglobin: 13.5 g/dL (ref 13.0–17.0)
MCH: 31.2 pg (ref 26.0–34.0)
MCHC: 33 g/dL (ref 30.0–36.0)
MCV: 94.5 fL (ref 80.0–100.0)
Platelets: 64 10*3/uL — ABNORMAL LOW (ref 150–400)
RBC: 4.33 MIL/uL (ref 4.22–5.81)
RDW: 20.1 % — ABNORMAL HIGH (ref 11.5–15.5)
WBC: 6.1 10*3/uL (ref 4.0–10.5)
nRBC: 0 % (ref 0.0–0.2)

## 2023-02-01 LAB — COMPREHENSIVE METABOLIC PANEL
ALT: 23 U/L (ref 0–44)
AST: 31 U/L (ref 15–41)
Albumin: 3.3 g/dL — ABNORMAL LOW (ref 3.5–5.0)
Alkaline Phosphatase: 104 U/L (ref 38–126)
Anion gap: 24 — ABNORMAL HIGH (ref 5–15)
BUN: 49 mg/dL — ABNORMAL HIGH (ref 8–23)
CO2: 22 mmol/L (ref 22–32)
Calcium: 9.5 mg/dL (ref 8.9–10.3)
Chloride: 91 mmol/L — ABNORMAL LOW (ref 98–111)
Creatinine, Ser: 6.34 mg/dL — ABNORMAL HIGH (ref 0.61–1.24)
GFR, Estimated: 8 mL/min — ABNORMAL LOW (ref >60)
Glucose, Bld: 107 mg/dL — ABNORMAL HIGH (ref 70–99)
Potassium: 4.4 mmol/L (ref 3.5–5.1)
Sodium: 137 mmol/L (ref 135–145)
Total Bilirubin: 0.5 mg/dL (ref 0.3–1.2)
Total Protein: 8 g/dL (ref 6.5–8.1)

## 2023-02-01 LAB — CBG MONITORING, ED
Glucose-Capillary: 46 mg/dL — ABNORMAL LOW (ref 70–99)
Glucose-Capillary: <10 mg/dL — CL (ref 70–99)
Glucose-Capillary: 16 mg/dL — CL (ref 70–99)
Glucose-Capillary: 165 mg/dL — ABNORMAL HIGH (ref 70–99)

## 2023-02-01 LAB — PROTIME-INR
INR: 1.4 — ABNORMAL HIGH (ref 0.8–1.2)
Prothrombin Time: 17.1 seconds — ABNORMAL HIGH (ref 11.4–15.2)

## 2023-02-01 LAB — BRAIN NATRIURETIC PEPTIDE: B Natriuretic Peptide: >4500 pg/mL — ABNORMAL HIGH (ref 0.0–100.0)

## 2023-02-01 LAB — MAGNESIUM: Magnesium: 2 mg/dL (ref 1.7–2.4)

## 2023-02-01 LAB — PHOSPHORUS: Phosphorus: 2.6 mg/dL (ref 2.5–4.6)

## 2023-02-01 LAB — TROPONIN I (HIGH SENSITIVITY)
Troponin I (High Sensitivity): 64 ng/L — ABNORMAL HIGH (ref <18)
Troponin I (High Sensitivity): 53 ng/L — ABNORMAL HIGH (ref <18)

## 2023-02-01 LAB — CK: Total CK: 56 U/L (ref 49–397)

## 2023-02-01 LAB — LIPASE, BLOOD: Lipase: 92 U/L — ABNORMAL HIGH (ref 11–51)

## 2023-02-01 LAB — AMMONIA: Ammonia: 120 umol/L — ABNORMAL HIGH (ref 9–35)

## 2023-02-01 MED ORDER — SODIUM CHLORIDE 0.9% FLUSH
3.0000 mL | Freq: Two times a day (BID) | INTRAVENOUS | Status: DC
  Administered 2023-02-01 – 2023-02-05 (×8): 3 mL via INTRAVENOUS

## 2023-02-01 MED ORDER — DEXTROSE 50 % IV SOLN
1.0000 | INTRAVENOUS | Status: DC | PRN
  Administered 2023-02-01 (×2): 50 mL via INTRAVENOUS
  Filled 2023-02-01 (×2): qty 50

## 2023-02-01 MED ORDER — HEPARIN SODIUM (PORCINE) 1000 UNIT/ML IJ SOLN
INTRAMUSCULAR | Status: AC
  Administered 2023-02-01: 2000 [IU]
  Filled 2023-02-01: qty 2

## 2023-02-01 MED ORDER — LEVOTHYROXINE SODIUM 75 MCG PO TABS
75.0000 ug | ORAL_TABLET | Freq: Every day | ORAL | Status: DC
  Administered 2023-02-01 – 2023-02-05 (×5): 75 ug via ORAL
  Filled 2023-02-01 (×5): qty 1

## 2023-02-01 MED ORDER — DONEPEZIL HCL 10 MG PO TABS
10.0000 mg | ORAL_TABLET | Freq: Every day | ORAL | Status: DC
  Administered 2023-02-01 – 2023-02-04 (×4): 10 mg via ORAL
  Filled 2023-02-01 (×4): qty 1

## 2023-02-01 MED ORDER — ALBUTEROL SULFATE (2.5 MG/3ML) 0.083% IN NEBU: 2.5000 mg | INHALATION_SOLUTION | RESPIRATORY_TRACT | Status: DC | PRN

## 2023-02-01 MED ORDER — LACTULOSE 10 GM/15ML PO SOLN: 30.0000 g | Freq: Two times a day (BID) | ORAL | Status: DC | PRN

## 2023-02-01 MED ORDER — SEVELAMER CARBONATE 800 MG PO TABS
800.0000 mg | ORAL_TABLET | Freq: Three times a day (TID) | ORAL | Status: DC
  Administered 2023-02-01 – 2023-02-05 (×11): 800 mg via ORAL
  Filled 2023-02-01 (×11): qty 1

## 2023-02-01 MED ORDER — ALLOPURINOL 100 MG PO TABS: 100.0000 mg | ORAL_TABLET | ORAL | Status: DC

## 2023-02-01 MED ORDER — CHLORHEXIDINE GLUCONATE CLOTH 2 % EX PADS
6.0000 | MEDICATED_PAD | Freq: Every day | CUTANEOUS | Status: DC
  Administered 2023-02-03 – 2023-02-05 (×2): 6 via TOPICAL

## 2023-02-01 MED ORDER — DOXYCYCLINE HYCLATE 100 MG PO TABS
100.0000 mg | ORAL_TABLET | Freq: Two times a day (BID) | ORAL | Status: DC
  Administered 2023-02-01 – 2023-02-05 (×9): 100 mg via ORAL
  Filled 2023-02-01 (×9): qty 1

## 2023-02-01 MED ORDER — LACTULOSE 10 GM/15ML PO SOLN
30.0000 g | Freq: Two times a day (BID) | ORAL | Status: DC
  Administered 2023-02-01 – 2023-02-03 (×4): 30 g via ORAL
  Filled 2023-02-01 (×4): qty 45

## 2023-02-01 MED ORDER — CINACALCET HCL 30 MG PO TABS: 60.0000 mg | ORAL_TABLET | ORAL | Status: DC

## 2023-02-01 MED ORDER — ACETAMINOPHEN 325 MG PO TABS
650.0000 mg | ORAL_TABLET | Freq: Four times a day (QID) | ORAL | Status: DC | PRN
  Administered 2023-02-04: 650 mg via ORAL
  Filled 2023-02-01: qty 2

## 2023-02-01 MED ORDER — LACTULOSE 10 GM/15ML PO SOLN
30.0000 g | Freq: Once | ORAL | Status: AC
  Administered 2023-02-01: 30 g via ORAL
  Filled 2023-02-01: qty 45

## 2023-02-01 MED ORDER — DEXTROSE 50 % IV SOLN: 50.0000 mL | Freq: Once | INTRAVENOUS | Status: DC

## 2023-02-01 MED ORDER — CINACALCET HCL 30 MG PO TABS
60.0000 mg | ORAL_TABLET | ORAL | Status: DC
  Administered 2023-02-04: 60 mg via ORAL
  Filled 2023-02-01 (×2): qty 2

## 2023-02-01 MED ORDER — ACETAMINOPHEN 650 MG RE SUPP: 650.0000 mg | Freq: Four times a day (QID) | RECTAL | Status: DC | PRN

## 2023-02-01 MED ORDER — INSULIN ASPART 100 UNIT/ML IJ SOLN: 0.0000 [IU] | Freq: Three times a day (TID) | INTRAMUSCULAR | Status: DC

## 2023-02-01 MED ORDER — MIDODRINE HCL 5 MG PO TABS
5.0000 mg | ORAL_TABLET | Freq: Three times a day (TID) | ORAL | Status: DC
  Administered 2023-02-01 – 2023-02-05 (×14): 5 mg via ORAL
  Filled 2023-02-01 (×14): qty 1

## 2023-02-01 MED ORDER — ATORVASTATIN CALCIUM 10 MG PO TABS
20.0000 mg | ORAL_TABLET | Freq: Every day | ORAL | Status: DC
  Administered 2023-02-01 – 2023-02-05 (×5): 20 mg via ORAL
  Filled 2023-02-01 (×5): qty 2

## 2023-02-01 MED ORDER — LEVOTHYROXINE SODIUM 25 MCG PO TABS: 50.0000 ug | ORAL_TABLET | Freq: Every day | ORAL | Status: DC

## 2023-02-01 MED ORDER — SERTRALINE HCL 50 MG PO TABS
150.0000 mg | ORAL_TABLET | Freq: Every day | ORAL | Status: DC
  Administered 2023-02-01 – 2023-02-05 (×5): 150 mg via ORAL
  Filled 2023-02-01 (×5): qty 1

## 2023-02-01 MED ORDER — CALCITRIOL 0.5 MCG PO CAPS
1.5000 ug | ORAL_CAPSULE | ORAL | Status: DC
  Filled 2023-02-01 (×2): qty 3

## 2023-02-01 MED ORDER — PANTOPRAZOLE SODIUM 40 MG PO TBEC
40.0000 mg | DELAYED_RELEASE_TABLET | Freq: Every day | ORAL | Status: DC
  Administered 2023-02-01 – 2023-02-05 (×5): 40 mg via ORAL
  Filled 2023-02-01 (×5): qty 1

## 2023-02-01 MED ORDER — HEPARIN SODIUM (PORCINE) 1000 UNIT/ML IJ SOLN
INTRAMUSCULAR | Status: AC
  Administered 2023-02-01: 1000 [IU]
  Filled 2023-02-01: qty 2

## 2023-02-01 MED ORDER — CALCITRIOL 0.5 MCG PO CAPS
1.5000 ug | ORAL_CAPSULE | ORAL | Status: DC
  Administered 2023-02-01 – 2023-02-04 (×2): 1.5 ug via ORAL
  Filled 2023-02-01 (×2): qty 3

## 2023-02-01 MED ORDER — DEXTROSE 50 % IV SOLN
50.0000 mL | INTRAVENOUS | Status: AC
  Administered 2023-02-01: 50 mL via INTRAVENOUS
  Filled 2023-02-01: qty 50

## 2023-02-01 NOTE — Progress Notes (Signed)
PT Cancellation Note  Patient Details Name: Tyler Hahn MRN: [id number] DOB: 1940/09/04   Cancelled Treatment:    Reason Eval/Treat Not Completed: Patient at procedure or test/unavailable (HD). Will follow up for PT evaluation as schedule permits.  Ina Homes, PT, DPT Acute Rehabilitation Services  Personal: Secure Chat Rehab Office: [phone]  Malachy Chamber 02/01/2023, 4:49 PM

## 2023-02-01 NOTE — Consult Note (Signed)
Renal Service Consult Note [facility]  Tyler Hahn 02/01/2023 [name], MD Requesting Physician: Dr. Arlyss Queen  Reason for Consult: ESRD pt w/  HPI: The patient is a 82 y.o. year-old w/ PMH as below who presented to ED this am for AMS. Caregiver noted he has off and on confusion/ AMS and was in hospital here from 8/6- 8/9 for AMS and fluid overload rx'd w/ dialysis. Since getting home he still has off and on episodes of AMS. In ED RR stable, BP 86- 116/ 55-64, SpO2 74% at lowest, improved w/ 2L [place]. Creat 6.3. last HD was 8/15. CT head showed no new changes. Pt rec'd lactulose for NH3 of 120.  We are asked to see for dialysis.   Pt seen in ED room. Caregiver at bedside. States his legs are swollen again, after becoming less swollen when he was here 10 days ago. Pt is w/o c/o's and is awake and alert, but doesn't know the year or the place.     ROS - denies CP, no joint pain, no HA, no blurry vision, no rash, no diarrhea, no nausea/ vomiting, no dysuria, no difficulty voiding   Past Medical History  Past Medical History:  Diagnosis Date   Anxiety    Automatic implantable cardioverter-defibrillator in situ    greg taylor   CHF (congestive heart failure) (HCC)    2000   Diabetes mellitus    no meds   Fatty liver    Gout    "bout 2-3 months ago"-meds helped.   Hyperlipidemia    Hypertension    Hyperthyroidism    Kidney cysts    PONV (postoperative nausea and vomiting)    Renal insufficiency    Past Surgical History  Past Surgical History:  Procedure Laterality Date   AV FISTULA PLACEMENT Right 08/23/2019   Procedure: ARTERIOVENOUS GORTEX GRAFT RIGHT ARM;  Surgeon: Larina Earthly, MD;  Location: [facility] OR;  Service: Vascular;  Laterality: Right;   CARDIAC DEFIBRILLATOR PLACEMENT     catract     COLONOSCOPY WITH PROPOFOL N/A 10/03/2015   Procedure: COLONOSCOPY WITH PROPOFOL;  Surgeon: Meryl Dare, MD;  Location: [facility] ENDOSCOPY;  Service: Endoscopy;   Laterality: N/A;   DIAGNOSTIC LAPAROSCOPY  01/27/2014   Dr Luisa Hart   ENTEROSCOPY N/A 11/25/2013   Procedure: ENTEROSCOPY;  Surgeon: Meryl Dare, MD;  Location: [facility] ENDOSCOPY;  Service: Endoscopy;  Laterality: N/A;   ICD GENERATOR CHANGEOUT N/A 11/06/2020   Procedure: ICD GENERATOR CHANGEOUT;  Surgeon: Duke Salvia, MD;  Location: [facility] INVASIVE CV LAB;  Service: Cardiovascular;  Laterality: N/A;   ICD,[place] [employer]     LAPAROSCOPY N/A 01/27/2014   Procedure: LAPAROSCOPY DIAGNOSTIC;  Surgeon: Clovis Pu. Cornett, MD;  Location: [facility] OR;  Service: General;  Laterality: N/A;   polyp removal throat     difficulty speaking   Family History  Family History  Problem Relation Age of Onset   Stomach cancer Mother    Alzheimer's disease Mother    Diabetes Father    Heart disease Father    Liver disease Father    Kidney disease Father    Social History  reports that he quit smoking about 24 years ago. His smoking use included cigarettes. He has never used smokeless tobacco. He reports that he does not drink alcohol and does not use drugs. Allergies  Allergies  Allergen Reactions   Sulfa Antibiotics Itching and Rash   Sulfonamide Derivatives Itching and Rash   Home medications  Prior to Admission medications   Medication Sig Start Date End Date Taking? Authorizing Provider  allopurinol (ZYLOPRIM) 100 MG tablet TAKE 1 TABLET BY MOUTH 3 TIMES A WEEK ON MONDAY, [facility] AND FRIDAY 11/25/22  Yes Sheliah Hatch, MD  atorvastatin (LIPITOR) 20 MG tablet Take 1 tablet (20 mg total) by mouth daily. 04/24/22  Yes Sheliah Hatch, MD  calcitRIOL (ROCALTROL) 0.5 MCG capsule Take 3 capsules (1.5 mcg total) by mouth every Tuesday, Thursday, and Saturday at 6 PM. 01/25/23 02/24/23 Yes Arrien, York Ram, MD  donepezil (ARICEPT) 10 MG tablet TAKE 1 TABLET BY MOUTH EVERYDAY AT BEDTIME 06/03/22  Yes Sheliah Hatch, MD  doxycycline (VIBRA-TABS) 100 MG tablet Take 1 tablet (100 mg total) by  mouth 2 (two) times daily. 01/29/23  Yes Vivi Barrack, DPM  levothyroxine (SYNTHROID) 50 MCG tablet Take 1 tablet (50 mcg total) by mouth daily. 04/23/22  Yes Sheliah Hatch, MD  loratadine (CLARITIN) 10 MG tablet Take 1 tablet (10 mg total) by mouth daily. 02/25/22  Yes Sheliah Hatch, MD  midodrine (PROAMATINE) 5 MG tablet Take 1 tablet (5 mg total) by mouth 3 (three) times daily with meals. 01/24/23 02/23/23 Yes Arrien, York Ram, MD  Multiple Vitamins-Minerals (MULTIVITAMIN WITH MINERALS) tablet Take 1 tablet by mouth daily.   Yes [provider]  omeprazole (PRILOSEC) 20 MG capsule Take 1 capsule (20 mg total) by mouth daily. 11/27/22  Yes Sheliah Hatch, MD  sertraline (ZOLOFT) 100 MG tablet TAKE 1.5 TABLETS (150MG  TOTAL) BY MOUTH DAILY 10/07/22  Yes Sheliah Hatch, MD  sevelamer carbonate (RENVELA) 800 MG tablet Take 800 mg by mouth 3 (three) times daily with meals. 01/09/23  Yes [provider]  cinacalcet (SENSIPAR) 30 MG tablet Take 2 tablets (60 mg total) by mouth every Tuesday, Thursday, and Saturday at 6 PM. 01/25/23 02/24/23  Arrien, York Ram, MD  mupirocin ointment (BACTROBAN) 2 % Apply 1 Application topically 2 (two) times daily. Patient not taking: Reported on 02/01/2023 01/20/23   Vivi Barrack, DPM     Vitals:   02/01/23 1100 02/01/23 1109 02/01/23 1257 02/01/23 1440  BP: 94/71 94/71  116/61  Pulse:  93  85  Resp: 16 15  11   Temp:   97.7 F (36.5 C)   TempSrc:   Oral   SpO2: 100% 99%  99%  Weight:      Height:       Exam Gen alert, no distress No rash, cyanosis or gangrene Sclera anicteric, throat clear  No jvd or bruits Chest clear bilat to bases, no rales/ wheezing RRR no MRG Abd soft ntnd no mass or ascites +bs GU nl male MS no joint effusions or deformity Ext diffuse pitting 2+ pretib edema bilat Neuro is alert, Ox 3 , nf     RUA AVG+bruit      Home meds include - zyloprim, lipitor, donepezil,  levothyroxine, midodrine 5 tid, MVI, prilosec, sertraline, sevelamer carbonate, cinacalcet, prns     OP HD: NW TTS  3.5h   400/1.5   79.5kg  2/2.5 bath  AVG   Heparin 4000 - last OP HD 8/15, post wt 81.0kg - coming off 2-6 kg over for past 2.5 wks, closest was 8/15 - rocaltrol 1.5 mcg po three times per week - sensipar 60mg  po three times per week - no esa, last Hb 12.8    CXR 02/01/23 - IMPRESSION: Low lung volumes with mild, diffusely increased lung markings which are likely,  in part, chronic in nature. A mild, superimposed component of interstitial edema cannot be excluded.    Assessment/ Plan: Volume overload/ acute hypoxic resp failure - mild IS edema on CXR and sig LE edema on exam. No resp distress. Will plan HD today w/ max UF and midodrine support.  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 Has not missed HD. HD today on schedule.  Hypotension - chronic, cont midodrine here.   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   MBD ckd - CCa in range, will add on phos and continue sensipar and po vdra.  AMS - acute hepatic enceph, per pmd Chronic dementia DM2 - per pmd DNR      Vinson Moselle  MD [facility] 02/01/2023, 2:52 PM  Recent Labs  Lab 01/31/23 1143 02/01/23 0306 02/01/23 0343  HGB 13.1  --  13.5  ALBUMIN  --  3.3*  --   CALCIUM 10.3 9.5  --   CREATININE 5.52* 6.34*  --   K 4.5 4.4  --    Inpatient medications:  [START ON 02/03/2023] allopurinol  100 mg Oral Once per day on Monday Wednesday Friday   atorvastatin  20 mg Oral Daily   calcitRIOL  1.5 mcg Oral Q T,Th,Sat-1800   cinacalcet  60 mg Oral Q T,Th,Sat-1800   donepezil  10 mg Oral QHS   doxycycline  100 mg Oral BID   lactulose  30 g Oral BID   levothyroxine  75 mcg Oral Q0600   midodrine  5 mg Oral TID WC   pantoprazole  40 mg Oral Daily   sertraline  150 mg Oral Daily   sevelamer carbonate  800 mg Oral TID WC   sodium chloride flush  3 mL Intravenous Q12H    acetaminophen **OR** acetaminophen, albuterol, dextrose

## 2023-02-01 NOTE — Telephone Encounter (Signed)
Known dialysis patient- no cause for concern

## 2023-02-01 NOTE — ED Notes (Signed)
ED TO INPATIENT HANDOFF REPORT  ED Nurse Name and Phone #: [phone] Tyler Hahn Name/Age/Gender Tyler Hahn 82 y.o. male Room/Bed: 024C/024C  Code Status   Code Status: DNR  Home/SNF/Other Home Patient oriented to: self Is this baseline? Yes   Triage Complete: Triage complete  Chief Complaint Acute hepatic encephalopathy (HCC) [K76.82]  Triage Note No notes on file   Allergies Allergies  Allergen Reactions   Sulfa Antibiotics Itching and Rash   Sulfonamide Derivatives Itching and Rash    Level of Care/Admitting Diagnosis ED Disposition     ED Disposition  Admit   Condition  --   Comment  Hospital Area: [facility] [[id number]]  Level of Care: Telemetry Medical [104]  May admit patient to [facility] or [facility] if equivalent level of care is available:: No  Covid Evaluation: Asymptomatic - no recent exposure (last 10 days) testing not required  Diagnosis: Acute hepatic encephalopathy [facility]) [[id number]]  Admitting Physician: Clydie Braun [[id number]]  Attending Physician: Clydie Braun [[id number]]  Certification:: I certify this patient will need inpatient services for at least 2 midnights  Expected Medical Readiness: 02/03/2023          B Medical/Surgery History Past Medical History:  Diagnosis Date   Anxiety    Automatic implantable cardioverter-defibrillator in situ    greg taylor   CHF (congestive heart failure) (HCC)    2000   Diabetes mellitus    no meds   Fatty liver    Gout    "bout 2-3 months ago"-meds helped.   Hyperlipidemia    Hypertension    Hyperthyroidism    Kidney cysts    PONV (postoperative nausea and vomiting)    Renal insufficiency    Past Surgical History:  Procedure Laterality Date   AV FISTULA PLACEMENT Right 08/23/2019   Procedure: ARTERIOVENOUS GORTEX GRAFT RIGHT ARM;  Surgeon: Larina Earthly, MD;  Location: [facility] OR;  Service: Vascular;  Laterality: Right;   CARDIAC DEFIBRILLATOR PLACEMENT      catract     COLONOSCOPY WITH PROPOFOL N/A 10/03/2015   Procedure: COLONOSCOPY WITH PROPOFOL;  Surgeon: Meryl Dare, MD;  Location: [facility] ENDOSCOPY;  Service: Endoscopy;  Laterality: N/A;   DIAGNOSTIC LAPAROSCOPY  01/27/2014   Dr Luisa Hart   ENTEROSCOPY N/A 11/25/2013   Procedure: ENTEROSCOPY;  Surgeon: Meryl Dare, MD;  Location: [facility] ENDOSCOPY;  Service: Endoscopy;  Laterality: N/A;   ICD GENERATOR CHANGEOUT N/A 11/06/2020   Procedure: ICD GENERATOR CHANGEOUT;  Surgeon: Duke Salvia, MD;  Location: [facility] INVASIVE CV LAB;  Service: Cardiovascular;  Laterality: N/A;   ICD,[place] [employer]     LAPAROSCOPY N/A 01/27/2014   Procedure: LAPAROSCOPY DIAGNOSTIC;  Surgeon: Clovis Pu. Cornett, MD;  Location: [facility] OR;  Service: General;  Laterality: N/A;   polyp removal throat     difficulty speaking     A IV Location/Drains/Wounds Patient Lines/Drains/Airways Status     Active Line/Drains/Airways     Name Placement date Placement time Site Days   Peripheral IV 02/01/23 20 G 1" Left;Posterior Forearm 02/01/23  0841  Forearm  less than 1   Fistula / Graft Right Upper arm Arteriovenous vein graft 08/23/19  1015  Upper arm  1258            Intake/Output Last 24 hours No intake or output data in the 24 hours ending 02/01/23 1008  Labs/Imaging Results for orders placed or performed during the hospital encounter of 02/01/23 (from the past  48 hour(s))  Comprehensive metabolic panel     Status: Abnormal   Collection Time: 02/01/23  3:06 AM  Result Value Ref Range   Sodium 137 135 - 145 mmol/L   Potassium 4.4 3.5 - 5.1 mmol/L   Chloride 91 (L) 98 - 111 mmol/L   CO2 22 22 - 32 mmol/L   Glucose, Bld 107 (H) 70 - 99 mg/dL    Comment: Glucose reference range applies only to samples taken after fasting for at least 8 hours.   BUN 49 (H) 8 - 23 mg/dL   Creatinine, Ser 7.56 (H) 0.61 - 1.24 mg/dL   Calcium 9.5 8.9 - 43.3 mg/dL   Total Protein 8.0 6.5 - 8.1 g/dL   Albumin 3.3 (L) 3.5 - 5.0 g/dL    AST 31 15 - 41 U/L   ALT 23 0 - 44 U/L   Alkaline Phosphatase 104 38 - 126 U/L   Total Bilirubin 0.5 0.3 - 1.2 mg/dL   GFR, Estimated 8 (L) >60 mL/min    Comment: (NOTE) Calculated using the CKD-EPI Creatinine Equation (2021)    Anion gap 24 (H) 5 - 15    Comment: ELECTROLYTES REPEATED TO VERIFY Performed at [facility], [address], [place], [place] [address]   Lipase, blood     Status: Abnormal   Collection Time: 02/01/23  3:06 AM  Result Value Ref Range   Lipase 92 (H) 11 - 51 U/L    Comment: Performed at [facility], [address], [place], [place] [address]  Troponin I (High Sensitivity)     Status: Abnormal   Collection Time: 02/01/23  3:06 AM  Result Value Ref Range   Troponin I (High Sensitivity) 64 (H) <18 ng/L    Comment: (NOTE) Elevated high sensitivity troponin I (hsTnI) values and significant  changes across serial measurements may suggest ACS but many other  chronic and acute conditions are known to elevate hsTnI results.  Refer to the "Links" section for chest pain algorithms and additional  guidance. Performed at [facility], [address] [place], [place] [address]   CK     Status: None   Collection Time: 02/01/23  3:06 AM  Result Value Ref Range   Total CK 56 49 - 397 U/L    Comment: Performed at [facility], [address] [place], [place] [address]  Protime-INR     Status: Abnormal   Collection Time: 02/01/23  3:06 AM  Result Value Ref Range   Prothrombin Time 17.1 (H) 11.4 - 15.2 seconds   INR 1.4 (H) 0.8 - 1.2    Comment: (NOTE) INR goal varies based on device and disease states. Performed at [facility], [address], [place], [place] [address]   Magnesium     Status: None   Collection Time: 02/01/23  3:06 AM  Result Value Ref Range   Magnesium 2.0 1.7 - 2.4 mg/dL    Comment: Performed at [facility], [address] [place], [place] [address]  Ammonia     Status: Abnormal   Collection Time:  02/01/23  3:43 AM  Result Value Ref Range   Ammonia 120 (H) 9 - 35 umol/L    Comment: HEMOLYSIS AT THIS LEVEL MAY AFFECT RESULT Performed at [facility], [address], [place], [place] [address]   Brain natriuretic peptide     Status: Abnormal   Collection Time: 02/01/23  3:43 AM  Result Value Ref Range   B Natriuretic Peptide >4,500.0 (H) 0.0 - 100.0 pg/mL    Comment: Performed at [facility], [address] [place], [place] [address]  CBC     Status: Abnormal   Collection Time: 02/01/23  3:43 AM  Result Value Ref Range   WBC 6.1 4.0 - 10.5 K/uL   RBC 4.33 4.22 - 5.81 MIL/uL   Hemoglobin 13.5 13.0 - 17.0 g/dL   HCT 19.1 47.8 - 29.5 %   MCV 94.5 80.0 - 100.0 fL   MCH 31.2 26.0 - 34.0 pg   MCHC 33.0 30.0 - 36.0 g/dL   RDW 62.1 (H) 30.8 - 65.7 %   Platelets 64 (L) 150 - 400 K/uL    Comment: Immature Platelet Fraction may be clinically indicated, consider ordering this additional test [id number] REPEATED TO VERIFY    nRBC 0.0 0.0 - 0.2 %    Comment: Performed at [facility], [address], [place], [place] [address]  Troponin I (High Sensitivity)     Status: Abnormal   Collection Time: 02/01/23  4:33 AM  Result Value Ref Range   Troponin I (High Sensitivity) 53 (H) <18 ng/L    Comment: (NOTE) Elevated high sensitivity troponin I (hsTnI) values and significant  changes across serial measurements may suggest ACS but many other  chronic and acute conditions are known to elevate hsTnI results.  Refer to the "Links" section for chest pain algorithms and additional  guidance. Performed at [facility], [address], [place], [place] [address]    CT HEAD WO CONTRAST ( )  Result Date: 02/01/2023 CLINICAL DATA:  Altered mental status. EXAM: CT HEAD WITHOUT CONTRAST TECHNIQUE: Contiguous axial images were obtained from the base of the skull through the vertex without intravenous contrast. RADIATION DOSE REDUCTION: This exam was performed according to  the departmental dose-optimization program which includes automated exposure control, adjustment of the mA and/or kV according to patient size and/or use of iterative reconstruction technique. COMPARISON:  None Available. FINDINGS: Brain: There is moderate severity cerebral atrophy with widening of the extra-axial spaces and ventricular dilatation. There are areas of decreased attenuation within the white matter tracts of the supratentorial brain, consistent with microvascular disease changes. Vascular: There is marked severity bilateral cavernous carotid artery calcification. Skull: Normal. Negative for fracture or focal lesion. Sinuses/Orbits: No acute finding. Other: None. IMPRESSION: 1. Generalized cerebral atrophy with chronic white matter small vessel ischemic changes. 2. No acute intracranial abnormality. Electronically Signed   By: Aram Candela M.D.   On: 02/01/2023 02:06   DG Chest Port 1 View  Result Date: 02/01/2023 CLINICAL DATA:  Altered mental status. EXAM: PORTABLE CHEST 1 VIEW COMPARISON:  January 21, 2023 FINDINGS: There is stable dual lead AICD positioning. The cardiac silhouette is mildly enlarged and unchanged in size. There is moderate severity calcification of the aortic arch. Low lung volumes are seen with mild, diffusely increased lung markings. Mild atelectasis is noted within the bilateral lung bases. No pleural effusion or pneumothorax is identified. The visualized skeletal structures are unremarkable. IMPRESSION: Low lung volumes with mild, diffusely increased lung markings which are likely, in part, chronic in nature. A mild, superimposed component of interstitial edema cannot be excluded. Electronically Signed   By: Aram Candela M.D.   On: 02/01/2023 01:59    Pending Labs Unresulted Labs (From admission, onward)     Start     Ordered   02/02/23 0500  CBC  Tomorrow  morning,   R        02/01/23 0952   02/02/23 0500  Renal function panel  Daily,   R      02/01/23 0952    02/01/23 0951  C-reactive protein  Add-on,   AD        02/01/23 0952   02/01/23 0951  Sedimentation rate  Add-on,   AD        02/01/23 0952            Vitals/Pain Today's Vitals   02/01/23 0600 02/01/23 0630 02/01/23 0800 02/01/23 0900  BP: (!) 86/55 (!) 93/58 113/65 (!) 97/59  Pulse: 80 78    Resp: (!) 23 12 16 15   Temp:    98.1 F (36.7 C)  SpO2: (!) 77% 90%  99%  Weight:      Height:        Isolation Precautions No active isolations  Medications Medications  donepezil (ARICEPT) tablet 10 mg (has no administration in time range)  midodrine (PROAMATINE) tablet 5 mg (has no administration in time range)  sertraline (ZOLOFT) tablet 150 mg (has no administration in time range)  atorvastatin (LIPITOR) tablet 20 mg (has no administration in time range)  calcitRIOL (ROCALTROL) capsule 1.5 mcg (has no administration in time range)  cinacalcet (SENSIPAR) tablet 60 mg (has no administration in time range)  doxycycline (VIBRA-TABS) tablet 100 mg (has no administration in time range)  allopurinol (ZYLOPRIM) tablet 100 mg (has no administration in time range)  sevelamer carbonate (RENVELA) tablet 800 mg (has no administration in time range)  pantoprazole (PROTONIX) EC tablet 40 mg (has no administration in time range)  sodium chloride flush (NS) 0.9 % injection 3 mL (3 mLs Intravenous Not Given 02/01/23 1005)  acetaminophen (TYLENOL) tablet 650 mg (has no administration in time range)    Or  acetaminophen (TYLENOL) suppository 650 mg (has no administration in time range)  albuterol (PROVENTIL) (2.5 MG/3ML) 0.083% nebulizer solution 2.5 mg (has no administration in time range)  levothyroxine (SYNTHROID) tablet 75 mcg (has no administration in time range)  lactulose (CHRONULAC) 10 GM/15ML solution 30 g (has no administration in time range)  insulin aspart (novoLOG) injection 0-6 Units (has no administration in time range)  lactulose (CHRONULAC) 10 GM/15ML solution 30 g (30 g Oral  Given 02/01/23 0745)    Mobility     Focused Assessments Renal Assessment Handoff:  Hemodialysis Schedule:  Last Hemodialysis date and time:    Restricted appendage: right arm   R Recommendations: See Admitting Provider Note  Report given to:   Additional Notes:

## 2023-02-01 NOTE — ED Notes (Signed)
edh     

## 2023-02-01 NOTE — ED Notes (Signed)
Pts.CBG was 46, pt. Stated that he felt "good." RN notified and I gave him orange juice

## 2023-02-01 NOTE — ED Notes (Signed)
Pt to US.

## 2023-02-01 NOTE — ED Notes (Signed)
ED TO INPATIENT HANDOFF REPORT  ED Nurse Name and Phone #: Pennie Rushing A. Anmol Paschen, RN [phone]  S Name/Age/Gender Tyler Hahn 82 y.o. male Room/Bed: 046C/046C  Code Status   Code Status: DNR  Home/SNF/Other Home Patient oriented to: self and place Is this baseline? Yes   Triage Complete: Triage complete  Chief Complaint Acute hepatic encephalopathy (HCC) [K76.82]  Triage Note No notes on file   Allergies Allergies  Allergen Reactions   Sulfa Antibiotics Itching and Rash   Sulfonamide Derivatives Itching and Rash    Level of Care/Admitting Diagnosis ED Disposition     ED Disposition  Admit   Condition  --   Comment  Hospital Area: [facility] [[id number]]  Level of Care: Telemetry Medical [104]  May admit patient to [facility] or [facility] if equivalent level of care is available:: No  Covid Evaluation: Asymptomatic - no recent exposure (last 10 days) testing not required  Diagnosis: Acute hepatic encephalopathy [facility]) [[id number]]  Admitting Physician: Clydie Braun [[id number]]  Attending Physician: Clydie Braun [[id number]]  Certification:: I certify this patient will need inpatient services for at least 2 midnights  Expected Medical Readiness: 02/03/2023          B Medical/Surgery History Past Medical History:  Diagnosis Date   Anxiety    Automatic implantable cardioverter-defibrillator in situ    greg taylor   CHF (congestive heart failure) (HCC)    2000   Diabetes mellitus    no meds   Fatty liver    Gout    "bout 2-3 months ago"-meds helped.   Hyperlipidemia    Hypertension    Hyperthyroidism    Kidney cysts    PONV (postoperative nausea and vomiting)    Renal insufficiency    Past Surgical History:  Procedure Laterality Date   AV FISTULA PLACEMENT Right 08/23/2019   Procedure: ARTERIOVENOUS GORTEX GRAFT RIGHT ARM;  Surgeon: Larina Earthly, MD;  Location: [facility] OR;  Service: Vascular;  Laterality: Right;   CARDIAC DEFIBRILLATOR  PLACEMENT     catract     COLONOSCOPY WITH PROPOFOL N/A 10/03/2015   Procedure: COLONOSCOPY WITH PROPOFOL;  Surgeon: Meryl Dare, MD;  Location: [facility] ENDOSCOPY;  Service: Endoscopy;  Laterality: N/A;   DIAGNOSTIC LAPAROSCOPY  01/27/2014   Dr Luisa Hart   ENTEROSCOPY N/A 11/25/2013   Procedure: ENTEROSCOPY;  Surgeon: Meryl Dare, MD;  Location: [facility] ENDOSCOPY;  Service: Endoscopy;  Laterality: N/A;   ICD GENERATOR CHANGEOUT N/A 11/06/2020   Procedure: ICD GENERATOR CHANGEOUT;  Surgeon: Duke Salvia, MD;  Location: [facility] INVASIVE CV LAB;  Service: Cardiovascular;  Laterality: N/A;   ICD,[employer]     LAPAROSCOPY N/A 01/27/2014   Procedure: LAPAROSCOPY DIAGNOSTIC;  Surgeon: Clovis Pu. Cornett, MD;  Location: [facility] OR;  Service: General;  Laterality: N/A;   polyp removal throat     difficulty speaking     A IV Location/Drains/Wounds Patient Lines/Drains/Airways Status     Active Line/Drains/Airways     Name Placement date Placement time Site Days   Peripheral IV 02/01/23 20 G 1" Left;Posterior Forearm 02/01/23  0841  Forearm  less than 1   Fistula / Graft Right Upper arm Arteriovenous vein graft 08/23/19  1015  Upper arm  1258            Intake/Output Last 24 hours No intake or output data in the 24 hours ending 02/01/23 1525  Labs/Imaging Results for orders placed or performed during the hospital encounter of  02/01/23 (from the past 48 hour(s))  Comprehensive metabolic panel     Status: Abnormal   Collection Time: 02/01/23  3:06 AM  Result Value Ref Range   Sodium 137 135 - 145 mmol/L   Potassium 4.4 3.5 - 5.1 mmol/L   Chloride 91 (L) 98 - 111 mmol/L   CO2 22 22 - 32 mmol/L   Glucose, Bld 107 (H) 70 - 99 mg/dL    Comment: Glucose reference range applies only to samples taken after fasting for at least 8 hours.   BUN 49 (H) 8 - 23 mg/dL   Creatinine, Ser 4.09 (H) 0.61 - 1.24 mg/dL   Calcium 9.5 8.9 - 81.1 mg/dL   Total Protein 8.0 6.5 - 8.1 g/dL   Albumin 3.3 (L)  3.5 - 5.0 g/dL   AST 31 15 - 41 U/L   ALT 23 0 - 44 U/L   Alkaline Phosphatase 104 38 - 126 U/L   Total Bilirubin 0.5 0.3 - 1.2 mg/dL   GFR, Estimated 8 (L) >60 mL/min    Comment: (NOTE) Calculated using the CKD-EPI Creatinine Equation (2021)    Anion gap 24 (H) 5 - 15    Comment: ELECTROLYTES REPEATED TO VERIFY Performed at [facility] Lab, [address], [place], [place] [address]   Lipase, blood     Status: Abnormal   Collection Time: 02/01/23  3:06 AM  Result Value Ref Range   Lipase 92 (H) 11 - 51 U/L    Comment: Performed at [facility], [address] [place], [place] [address]  Troponin I (High Sensitivity)     Status: Abnormal   Collection Time: 02/01/23  3:06 AM  Result Value Ref Range   Troponin I (High Sensitivity) 64 (H) <18 ng/L    Comment: (NOTE) Elevated high sensitivity troponin I (hsTnI) values and significant  changes across serial measurements may suggest ACS but many other  chronic and acute conditions are known to elevate hsTnI results.  Refer to the "Links" section for chest pain algorithms and additional  guidance. Performed at [facility] Lab, [address] [place], [place] [address]   CK     Status: None   Collection Time: 02/01/23  3:06 AM  Result Value Ref Range   Total CK 56 49 - 397 U/L    Comment: Performed at [facility], [address], [place], [place] [address]  Protime-INR     Status: Abnormal   Collection Time: 02/01/23  3:06 AM  Result Value Ref Range   Prothrombin Time 17.1 (H) 11.4 - 15.2 seconds   INR 1.4 (H) 0.8 - 1.2    Comment: (NOTE) INR goal varies based on device and disease states. Performed at [facility], [address], [place], [place] [address]   Magnesium     Status: None   Collection Time: 02/01/23  3:06 AM  Result Value Ref Range   Magnesium 2.0 1.7 - 2.4 mg/dL    Comment: Performed at [facility] Lab, [address], [place], [place] [address]  Ammonia     Status: Abnormal    Collection Time: 02/01/23  3:43 AM  Result Value Ref Range   Ammonia 120 (H) 9 - 35 umol/L    Comment: HEMOLYSIS AT THIS LEVEL MAY AFFECT RESULT Performed at [facility], [address], [place], [place] [address]   Brain natriuretic peptide     Status: Abnormal   Collection Time: 02/01/23  3:43 AM  Result Value Ref Range   B Natriuretic Peptide >4,500.0 (H) 0.0 - 100.0 pg/mL    Comment: Performed at [facility], [address] [place], [place] [address]  CBC     Status: Abnormal   Collection Time: 02/01/23  3:43 AM  Result Value Ref Range   WBC 6.1 4.0 - 10.5 K/uL   RBC 4.33 4.22 - 5.81 MIL/uL   Hemoglobin 13.5 13.0 - 17.0 g/dL   HCT 60.4 54.0 - 98.1 %   MCV 94.5 80.0 - 100.0 fL   MCH 31.2 26.0 - 34.0 pg   MCHC 33.0 30.0 - 36.0 g/dL   RDW 19.1 (H) 47.8 - 29.5 %   Platelets 64 (L) 150 - 400 K/uL    Comment: Immature Platelet Fraction may be clinically indicated, consider ordering this additional test [id number] REPEATED TO VERIFY    nRBC 0.0 0.0 - 0.2 %    Comment: Performed at [facility] Lab, [address], [place], [place] [address]  Troponin I (High Sensitivity)     Status: Abnormal   Collection Time: 02/01/23  4:33 AM  Result Value Ref Range   Troponin I (High Sensitivity) 53 (H) <18 ng/L    Comment: (NOTE) Elevated high sensitivity troponin I (hsTnI) values and significant  changes across serial measurements may suggest ACS but many other  chronic and acute conditions are known to elevate hsTnI results.  Refer to the "Links" section for chest pain algorithms and additional  guidance. Performed at [facility] Lab, [address] [place], [place] [address]   CBG monitoring, ED     Status: Abnormal   Collection Time: 02/01/23 12:33 PM  Result Value Ref Range   Glucose-Capillary 46 (L) 70 - 99 mg/dL    Comment: Glucose reference range applies only to samples taken after fasting for at least 8 hours.   Comment 1 Notify RN    Comment 2 Document  in Chart   CBG monitoring, ED     Status: Abnormal   Collection Time: 02/01/23  1:19 PM  Result Value Ref Range   Glucose-Capillary <10 (LL) 70 - 99 mg/dL    Comment: Glucose reference range applies only to samples taken after fasting for at least 8 hours.  CBG monitoring, ED     Status: Abnormal   Collection Time: 02/01/23  2:08 PM  Result Value Ref Range   Glucose-Capillary 16 (LL) 70 - 99 mg/dL    Comment: Glucose reference range applies only to samples taken after fasting for at least 8 hours.   Comment 1 Notify RN    Comment 2 Document in Chart   CBG monitoring, ED     Status: Abnormal   Collection Time: 02/01/23  3:14 PM  Result Value Ref Range   Glucose-Capillary 165 (H) 70 - 99 mg/dL    Comment: Glucose reference range applies only to samples taken after fasting for at least 8 hours.   US Abdomen Complete  Result Date: 02/01/2023 CLINICAL DATA:  Pancreatitis, elevated lipase. EXAM: ABDOMEN ULTRASOUND COMPLETE COMPARISON:  Renal ultrasound dated 08/17/2019 and CT abdomen and pelvis dated 01/21/2023. FINDINGS: Gallbladder: No gallstones visualized. There is questionable gallbladder wall thickening up to 5 mm. No sonographic Murphy sign noted by sonographer. Common bile duct: Diameter: 3 mm Liver: No focal lesion identified. Within normal limits in parenchymal echogenicity. Portal vein is patent on color Doppler imaging with normal direction of blood flow towards the liver. IVC: Not well seen.  Pancreas: Not well seen. Spleen: Size and appearance within normal limits. Right Kidney: Length: 8.2 cm. Echogenicity is increased and the kidney appears atrophic. No mass or hydronephrosis visualized. Left Kidney: Not visualized. Abdominal aorta: No aneurysm visualized. Other findings: None. IMPRESSION: 1. Questionable gallbladder wall thickening. No gallstones visualized. 2. Atrophic right kidney. Electronically Signed   By: Romona Curls M.D.   On: 02/01/2023 12:14   DG Toe 2nd Right  Result  Date: 02/01/2023 CLINICAL DATA:  Pressure ulcer. History of end-stage kidney disease and diabetes. Dementia. EXAM: RIGHT SECOND TOE COMPARISON:  None Available. FINDINGS: There is no signs of acute fracture or dislocation. No focal bone erosions identified. Diffuse soft tissue edema is noted. Second through fifth hammertoe deformities identified. IMPRESSION: 1. No acute bone abnormality. 2. Diffuse soft tissue edema. 3. Second through fifth hammertoe deformities. Electronically Signed   By: Signa Kell M.D.   On: 02/01/2023 10:51   CT HEAD WO CONTRAST ( )  Result Date: 02/01/2023 CLINICAL DATA:  Altered mental status. EXAM: CT HEAD WITHOUT CONTRAST TECHNIQUE: Contiguous axial images were obtained from the base of the skull through the vertex without intravenous contrast. RADIATION DOSE REDUCTION: This exam was performed according to the departmental dose-optimization program which includes automated exposure control, adjustment of the mA and/or kV according to patient size and/or use of iterative reconstruction technique. COMPARISON:  None Available. FINDINGS: Brain: There is moderate severity cerebral atrophy with widening of the extra-axial spaces and ventricular dilatation. There are areas of decreased attenuation within the white matter tracts of the supratentorial brain, consistent with microvascular disease changes. Vascular: There is marked severity bilateral cavernous carotid artery calcification. Skull: Normal. Negative for fracture or focal lesion. Sinuses/Orbits: No acute finding. Other: None. IMPRESSION: 1. Generalized cerebral atrophy with chronic white matter small vessel ischemic changes. 2. No acute intracranial abnormality. Electronically Signed   By: Aram Candela M.D.   On: 02/01/2023 02:06   DG Chest Port 1 View  Result Date: 02/01/2023 CLINICAL DATA:  Altered mental status. EXAM: PORTABLE CHEST 1 VIEW COMPARISON:  January 21, 2023 FINDINGS: There is stable dual lead AICD  positioning. The cardiac silhouette is mildly enlarged and unchanged in size. There is moderate severity calcification of the aortic arch. Low lung volumes are seen with mild, diffusely increased lung markings. Mild atelectasis is noted within the bilateral lung bases. No pleural effusion or pneumothorax is identified. The visualized skeletal structures are unremarkable. IMPRESSION: Low lung volumes with mild, diffusely increased lung markings which are likely, in part, chronic in nature. A mild, superimposed component of interstitial edema cannot be excluded. Electronically Signed   By: Aram Candela M.D.   On: 02/01/2023 01:59    Pending Labs Unresulted Labs (From admission, onward)     Start     Ordered   02/02/23 0500  CBC  Tomorrow morning,   R        02/01/23 0952   02/02/23 0500  Renal function panel  Daily,   R      02/01/23 0952   02/01/23 0951  C-reactive protein  Add-on,   AD        02/01/23 0952   02/01/23 0951  Sedimentation rate  Add-on,   AD        02/01/23 0952            Vitals/Pain Today's Vitals   02/01/23 1100 02/01/23 1109 02/01/23 1257 02/01/23 1440  BP: 94/71 94/71  116/61  Pulse:  93  85  Resp:  16 15  11   Temp:   97.7 F (36.5 C)   TempSrc:   Oral   SpO2: 100% 99%  99%  Weight:      Height:        Isolation Precautions No active isolations  Medications Medications  donepezil (ARICEPT) tablet 10 mg (has no administration in time range)  midodrine (PROAMATINE) tablet 5 mg (5 mg Oral Given 02/01/23 1313)  sertraline (ZOLOFT) tablet 150 mg (150 mg Oral Given 02/01/23 1313)  atorvastatin (LIPITOR) tablet 20 mg (20 mg Oral Given 02/01/23 1313)  calcitRIOL (ROCALTROL) capsule 1.5 mcg (has no administration in time range)  cinacalcet (SENSIPAR) tablet 60 mg (has no administration in time range)  doxycycline (VIBRA-TABS) tablet 100 mg (100 mg Oral Given 02/01/23 1313)  allopurinol (ZYLOPRIM) tablet 100 mg (has no administration in time range)  sevelamer  carbonate (RENVELA) tablet 800 mg (800 mg Oral Given 02/01/23 1315)  pantoprazole (PROTONIX) EC tablet 40 mg (40 mg Oral Given 02/01/23 1314)  sodium chloride flush (NS) 0.9 % injection 3 mL (3 mLs Intravenous Not Given 02/01/23 1005)  acetaminophen (TYLENOL) tablet 650 mg (has no administration in time range)    Or  acetaminophen (TYLENOL) suppository 650 mg (has no administration in time range)  albuterol (PROVENTIL) (2.5 MG/3ML) 0.083% nebulizer solution 2.5 mg (has no administration in time range)  levothyroxine (SYNTHROID) tablet 75 mcg (75 mcg Oral Given 02/01/23 1313)  lactulose (CHRONULAC) 10 GM/15ML solution 30 g (has no administration in time range)  dextrose 50 % solution 50 mL (50 mLs Intravenous Given 02/01/23 1416)  Chlorhexidine Gluconate Cloth 2 % PADS 6 each (has no administration in time range)  lactulose (CHRONULAC) 10 GM/15ML solution 30 g (30 g Oral Given 02/01/23 0745)    Mobility walks with person assist     Focused Assessments    R Recommendations: See Admitting Provider Note  Report given to:   Additional Notes: Call or epic message for any additional details

## 2023-02-01 NOTE — ED Notes (Signed)
Katrinka Blazing, MD informed after pt had orange juice, his lunch tray & an amp of Dextrose his CBG was still 16. He is still showing no distress & oriented as he was prior to this event taking place. Another amp of Dextrose to be given now per verbal order.

## 2023-02-01 NOTE — Progress Notes (Signed)
Received patient in bed to unit.  Alert and oriented.  Informed consent signed and in chart.   TX duration:  Patient tolerated well.  Transported back to the room  Alert, without acute distress.  Hand-off given to patient's nurse.   Access used: fistula Access issues: none  Total UF removed: 3000 ml Medication(s) given: none Post HD VS: 112/65 Post HD weight: not able to obtain.     02/01/23 2015  Vitals  Temp 98.4 F (36.9 C)  Temp Source Oral  BP 112/65  MAP (mmHg) 79  BP Location Left Arm  BP Method Automatic  Patient Position (if appropriate) Lying  Pulse Rate 96  Pulse Rate Source Monitor  ECG Heart Rate 95  Resp (!) 22  Oxygen Therapy  SpO2 98 %  O2 Device Room Air  Patient Activity (if Appropriate) In bed  Pulse Oximetry Type Continuous  During Treatment Monitoring  Blood Flow Rate (mL/min) 0 mL/min  Arterial Pressure (mmHg) 41.81 mmHg  Venous Pressure (mmHg) -393.72 mmHg  TMP (mmHg) 12.73 mmHg  Ultrafiltration Rate (mL/min) 1053 mL/min  Dialysate Flow Rate (mL/min) 299 ml/min  Dialysis Fluid Bolus Normal Saline  Bolus Amount (mL) 300 mL  Dialysate Change 3K;2.5 Ca  Dialysate Potassium Concentration 3  Dialysate Calcium Concentration 2.5  Duration of HD Treatment -hour(s) 3.5 hour(s)  Cumulative Fluid Removed (mL) per Treatment  3000.26  Fistula / Graft Right Upper arm Arteriovenous vein graft  Placement Date/Time: 08/23/19 1015   Placed prior to admission: No  Orientation: Right  Access Location: Upper arm  Access Type: (c) Arteriovenous vein graft  Site Condition No complications  Fistula / Graft Assessment Present;Thrill;Bruit  Status Accessed  Drainage Description None

## 2023-02-01 NOTE — H&P (Signed)
History and Physical    Patient: Tyler Hahn:[id number] DOB: 07-19-1940 DOA: 02/01/2023 DOS: the patient was seen and examined on 02/01/2023 PCP: Sheliah Hatch, MD  Patient coming from: Home via EMS  Chief Complaint:  Chief Complaint  Patient presents with   Altered Mental Status    Patient to ED via EMS with complaint that care taker called 911 and stated " he is altered. EMS reports care taker also stated she "isn't sure if he is just tired.EMS reports patient had a busy day "walking around shopping all day"   HPI: Tyler Hahn is a 82 y.o. male with medical history significant of hypertension, hyperlipidemia, systolic congestive heart failure, s/p AICD, chronic hypotension, hypothyroidism, diet-controlled diabetes mellitus type 2, anxiety, and gout who presents after being noted to be acutely altered.  At this time patient is more alert, but only oriented to self been which appears to be his baseline.  He denies any complaints of fever, shortness of breath, sore throat (mentioned in EMS run sheet), chest pain nausea, vomiting, or diarrhea. Additional history is obtained from his caregiver over the phone.  Patient just recently been hospitalized from 8/6-8/9 for fluid overload in the setting of ESRD on HD for which patient was dialyzed on 2 consecutive days.  During hospitalization was noted to have intermittent confusion that would resolve on its own.  After getting home  the following week was noted to have moment of confusion where he could not remember how to walk.  Thereafter the next day he was back to his normal self.  Then last night at around 11:30 PM patient was noted to be lethargic, unable to move his feet with complaints of nausea, and reported not to be himself for which she called EMS.  He had been on antibiotics for a pressure ulcer of his second right toe.  In the emergency department patient was noted to be afebrile with respirations 12-24, blood pressures  86/55-116/64, and O2 saturations noted to be as low as 74% with improvement on 2 L of nasal cannula oxygen.  Labs significant for platelets 64, potassium 4.4, CO2 22, BUN 49, creatinine 6.34, anion gap 24, lipase 92, ammonia 120, INR 1.4, BNP greater than 4500. Chest xray low lung volumes with diffuse increased interstitial markings thought to be chronic in nature with possible superimposed interstitial edema.  CT scan of the head noted generalized cerebral atrophy with chronic white matter small vessel ischemic changes, but otherwise no acute abnormality.  Patient was given lactulose 30 g p.o.   Review of Systems: As mentioned in the history of present illness. All other systems reviewed and are negative. Past Medical History:  Diagnosis Date   Anxiety    Automatic implantable cardioverter-defibrillator in situ    greg taylor   CHF (congestive heart failure) (HCC)    2000   Diabetes mellitus    no meds   Fatty liver    Gout    "bout 2-3 months ago"-meds helped.   Hyperlipidemia    Hypertension    Hyperthyroidism    Kidney cysts    PONV (postoperative nausea and vomiting)    Renal insufficiency    Past Surgical History:  Procedure Laterality Date   AV FISTULA PLACEMENT Right 08/23/2019   Procedure: ARTERIOVENOUS GORTEX GRAFT RIGHT ARM;  Surgeon: Larina Earthly, MD;  Location: [facility] OR;  Service: Vascular;  Laterality: Right;   CARDIAC DEFIBRILLATOR PLACEMENT     catract     COLONOSCOPY WITH  PROPOFOL N/A 10/03/2015   Procedure: COLONOSCOPY WITH PROPOFOL;  Surgeon: Meryl Dare, MD;  Location: [facility] ENDOSCOPY;  Service: Endoscopy;  Laterality: N/A;   DIAGNOSTIC LAPAROSCOPY  01/27/2014   Dr Luisa Hart   ENTEROSCOPY N/A 11/25/2013   Procedure: ENTEROSCOPY;  Surgeon: Meryl Dare, MD;  Location: [facility];  Service: Endoscopy;  Laterality: N/A;   ICD GENERATOR CHANGEOUT N/A 11/06/2020   Procedure: ICD GENERATOR CHANGEOUT;  Surgeon: Duke Salvia, MD;  Location: [facility] INVASIVE CV LAB;   Service: Cardiovascular;  Laterality: N/A;   ICD,[place] [employer]     LAPAROSCOPY N/A 01/27/2014   Procedure: LAPAROSCOPY DIAGNOSTIC;  Surgeon: Clovis Pu. Cornett, MD;  Location: [facility] OR;  Service: General;  Laterality: N/A;   polyp removal throat     difficulty speaking   Social History:  reports that he quit smoking about 24 years ago. His smoking use included cigarettes. He has never used smokeless tobacco. He reports that he does not drink alcohol and does not use drugs.  Allergies  Allergen Reactions   Sulfa Antibiotics Itching and Rash   Sulfonamide Derivatives Itching and Rash    Family History  Problem Relation Age of Onset   Stomach cancer Mother    Alzheimer's disease Mother    Diabetes Father    Heart disease Father    Liver disease Father    Kidney disease Father     Prior to Admission medications   Medication Sig Start Date End Date Taking? Authorizing Provider  allopurinol (ZYLOPRIM) 100 MG tablet TAKE 1 TABLET BY MOUTH 3 TIMES A WEEK ON MONDAY, [facility] AND FRIDAY 11/25/22  Yes Sheliah Hatch, MD  atorvastatin (LIPITOR) 20 MG tablet Take 1 tablet (20 mg total) by mouth daily. 04/24/22  Yes Sheliah Hatch, MD  calcitRIOL (ROCALTROL) 0.5 MCG capsule Take 3 capsules (1.5 mcg total) by mouth every Tuesday, Thursday, and Saturday at 6 PM. 01/25/23 02/24/23 Yes Arrien, York Ram, MD  donepezil (ARICEPT) 10 MG tablet TAKE 1 TABLET BY MOUTH EVERYDAY AT BEDTIME 06/03/22  Yes Sheliah Hatch, MD  doxycycline (VIBRA-TABS) 100 MG tablet Take 1 tablet (100 mg total) by mouth 2 (two) times daily. 01/29/23  Yes Vivi Barrack, DPM  levothyroxine (SYNTHROID) 50 MCG tablet Take 1 tablet (50 mcg total) by mouth daily. 04/23/22  Yes Sheliah Hatch, MD  loratadine (CLARITIN) 10 MG tablet Take 1 tablet (10 mg total) by mouth daily. 02/25/22  Yes Sheliah Hatch, MD  midodrine (PROAMATINE) 5 MG tablet Take 1 tablet (5 mg total) by mouth 3 (three) times daily with  meals. 01/24/23 02/23/23 Yes Arrien, York Ram, MD  Multiple Vitamins-Minerals (MULTIVITAMIN WITH MINERALS) tablet Take 1 tablet by mouth daily.   Yes [provider]  omeprazole (PRILOSEC) 20 MG capsule Take 1 capsule (20 mg total) by mouth daily. 11/27/22  Yes Sheliah Hatch, MD  sertraline (ZOLOFT) 100 MG tablet TAKE 1.5 TABLETS (150MG  TOTAL) BY MOUTH DAILY 10/07/22  Yes Sheliah Hatch, MD  sevelamer carbonate (RENVELA) 800 MG tablet Take 800 mg by mouth 3 (three) times daily with meals. 01/09/23  Yes [provider]  cinacalcet (SENSIPAR) 30 MG tablet Take 2 tablets (60 mg total) by mouth every Tuesday, Thursday, and Saturday at 6 PM. 01/25/23 02/24/23  Arrien, York Ram, MD  mupirocin ointment (BACTROBAN) 2 % Apply 1 Application topically 2 (two) times daily. Patient not taking: Reported on 02/01/2023 01/20/23   Vivi Barrack, DPM    Physical Exam: Vitals:  02/01/23 0600 02/01/23 0630 02/01/23 0800 02/01/23 0900  BP: (!) 86/55 (!) 93/58 113/65 (!) 97/59  Pulse: 80 78    Resp: (!) 23 12 16 15   Temp:      SpO2: (!) 77% 90%  99%  Weight:      Height:        Constitutional: Elderly male currently in NAD, calm, comfortable Eyes: PERRL, lids and conjunctivae normal ENMT: Mucous membranes are moist.  Transfer to stepdown.  Mentation Neck: normal, supple, no masses, no thyromegaly Respiratory: Decreased aeration with crackles noted in the lower lung fields.  O2 saturations currently maintained on 2 L of nasal cannula oxygen. Cardiovascular: Regular rate and rhythm, no murmurs / rubs / gallops.  At least +2 pitting bilateral lower extremity edema.  Right upper extremity fistula in place with palpable thrill. Abdomen: no tenderness, no masses palpated.  Bowel sounds positive.  Musculoskeletal: no cyanosis. No joint deformity upper and lower extremities. Good ROM, no contractures. Normal muscle tone.  Skin: Ulceration of the head second toe of the right foot  and toenail partially removed of the right first toe as pictured below.  Nail partially removed on the left first toe as well not pictured.    Patient has offloading shoe on the right foot. Neurologic: CN 2-12 grossly intact.   Strength 5/5 in all 4.  Psychiatric: Normal judgment and insight. Alert and oriented x 3. Normal mood.   Data Reviewed:  Reviewed labs, imaging, and pertinent records as noted above in HPI.  Assessment and Plan: Acute hepatic encephalopathy Patient noted to be significantly lethargic yesterday evening for which EMS had been called.  Caregiver notes intermittent episodes of this prior to most recent hospitalization.  CT scan of the head negative for any acute abnormality.  Ammonia level elevated up to 120, but previously noted to be just mildly elevated at 5 a year ago.    Records note signs of probable mild fatty infiltration of the liver on abdominal ultrasound from 2016.  Patient was given lactulose 30 p.o. -Admit to a medical telemetry bed -Neurochecks -Monitor intake and output -Check abdominal ultrasound complete -Continue lactulose 30 g twice daily.  Continue to monitor and adjust dosing medically appropriate  Acute respiratory failure with hypoxia secondary to fluid overload ESRD on HD O2 saturations noted to be as low as 74% for which patient was placed on 2 L nasal cannula oxygen with improvement greater than 90%.  Chest x-ray noted low lung volumes with concern for interstitial edema.  BNP greater than 4500.  Patient is due for hemodialysis today and there have been noted to have increasing difficulty removing fluid with HD sessions. -Continue calcitriol, cinacalcet, and sevelamer -Nephrology consulted.  Fluid status managed with HD  Chronic hypotension Blood pressures have been noted to be soft 86/55.  Home medication regimen includes midodrine. -Continue midodrine  Systolic congestive heart failure Echocardiogram from 8/7 noted EF to be 25 to 30%  with severely dilated left ventricular internal cavity with diastolic function unable to be evaluated. -Fluid management with HD  Pressure ulcer of second right toe Patient has been on antibiotics of doxycycline for pressure ulcer of the right second great toe.. -Check ESR and CRP -Check x-ray right second great toe -Continue offloading shoe -Continue doxycycline  Diabetes mellitus type 2 with hypoglycemia, without long-term use of insulin Patient's last hemoglobin A1c is 7.1 on 01/21/2023.  He does not appear to be on any medications for treatment at home.  Blood sugar noted  to be 46 while in the ED given amp of D50. -Hypoglycemic protocols -CBGs q. before meals and at bedtime -Consider discontinuing if blood sugars are well-controlled  Hypothyroidism Last TSH was 9.642 on 01/21/2023, but appears to have been elevated with no significant changes in dosage noted despite elevated levels previously in the past. -Change levothyroxine to 75 mcg  Dementia At baseline patient normally alert and oriented to self.  They are -Delirium precautions -Continue Aricept  Thrombocytopenia Acute on chronic.  Platelet count 64.  Hyperlipidemia -Continue atorvastatin  GERD -Continue pharmacy substitution of Protonix for omeprazole  DVT prophylaxis: SCDs Advance Care Planning:   Code Status: DNR    Consults: Nephrology  Family Communication:    Severity of Illness: The appropriate patient status for this patient is INPATIENT. Inpatient status is judged to be reasonable and necessary in order to provide the required intensity of service to ensure the patient's safety. The patient's presenting symptoms, physical exam findings, and initial radiographic and laboratory data in the context of their chronic comorbidities is felt to place them at high risk for further clinical deterioration. Furthermore, it is not anticipated that the patient will be medically stable for discharge from the hospital within  2 midnights of admission.   * I certify that at the point of admission it is my clinical judgment that the patient will require inpatient hospital care spanning beyond 2 midnights from the point of admission due to high intensity of service, high risk for further deterioration and high frequency of surveillance required.*  Author: Clydie Braun, MD 02/01/2023 9:12 AM  For on call review [email or web address].

## 2023-02-01 NOTE — ED Provider Notes (Signed)
[facility]-EMERGENCY [facility]PT [facility] Emergency Department Provider Note MRN:  [id number]  Arrival date & time: 02/01/23     Chief Complaint   Altered mental status History of Present Illness   Tyler Hahn is a 82 y.o. year-old male with a history of CHF, ESRD presenting to the ED with chief complaint of altered mental status.  Decreased responsiveness this evening.  Recently in the hospital and discharged home.  When he returned from the hospital he was very weak and was taking a while to regain his strength.  Seem to be improving today.  Had a relatively normal day, went to the doctor, went to some stores.  This evening he seemed more tired than normal, decreased responsiveness, was unable to walk or get out of bed.  No fever, patient endorsing pain in the chest.  Review of Systems  A thorough review of systems was obtained and all systems are negative except as noted in the HPI and PMH.   Patient's Health History    Past Medical History:  Diagnosis Date   Anxiety    Automatic implantable cardioverter-defibrillator in situ    greg taylor   CHF (congestive heart failure) (HCC)    2000   Diabetes mellitus    no meds   Fatty liver    Gout    "bout 2-3 months ago"-meds helped.   Hyperlipidemia    Hypertension    Hyperthyroidism    Kidney cysts    PONV (postoperative nausea and vomiting)    Renal insufficiency     Past Surgical History:  Procedure Laterality Date   AV FISTULA PLACEMENT Right 08/23/2019   Procedure: ARTERIOVENOUS GORTEX GRAFT RIGHT ARM;  Surgeon: Larina Earthly, MD;  Location: [facility] OR;  Service: Vascular;  Laterality: Right;   CARDIAC DEFIBRILLATOR PLACEMENT     catract     COLONOSCOPY WITH PROPOFOL N/A 10/03/2015   Procedure: COLONOSCOPY WITH PROPOFOL;  Surgeon: Meryl Dare, MD;  Location: [facility];  Service: Endoscopy;  Laterality: N/A;   DIAGNOSTIC LAPAROSCOPY  01/27/2014   Dr Luisa Hart   ENTEROSCOPY N/A 11/25/2013   Procedure: ENTEROSCOPY;   Surgeon: Meryl Dare, MD;  Location: [facility];  Service: Endoscopy;  Laterality: N/A;   ICD GENERATOR CHANGEOUT N/A 11/06/2020   Procedure: ICD GENERATOR CHANGEOUT;  Surgeon: Duke Salvia, MD;  Location: [facility] INVASIVE CV LAB;  Service: Cardiovascular;  Laterality: N/A;   ICD,[place] [employer]     LAPAROSCOPY N/A 01/27/2014   Procedure: LAPAROSCOPY DIAGNOSTIC;  Surgeon: Clovis Pu. Cornett, MD;  Location: [facility] OR;  Service: General;  Laterality: N/A;   polyp removal throat     difficulty speaking    Family History  Problem Relation Age of Onset   Stomach cancer Mother    Alzheimer's disease Mother    Diabetes Father    Heart disease Father    Liver disease Father    Kidney disease Father     Social History   Socioeconomic History   Marital status: Widowed    Spouse name: Not on file   Number of children: 4   Years of education: Not on file   Highest education level: Not on file  Occupational History   Occupation: Retired    Associate Professor: RETIRED  Tobacco Use   Smoking status: Former    Current packs/day: 0.00    Types: Cigarettes    Quit date: 11/26/1998    Years since quitting: 24.2   Smokeless tobacco: Never  Vaping Use   Vaping  status: Never Used  Substance and Sexual Activity   Alcohol use: No    Alcohol/week: 0.0 standard drinks of alcohol   Drug use: No   Sexual activity: Not Currently  Other Topics Concern   Not on file  Social History Narrative   Not on file   Social Determinants of Health   Financial Resource Strain: Low Risk  (12/04/2022)   Overall Financial Resource Strain (CARDIA)    Difficulty of Paying Living Expenses: Not hard at all  Food Insecurity: No Food Insecurity (01/27/2023)   Hunger Vital Sign    Worried About Running Out of Food in the Last Year: Never true    Ran Out of Food in the Last Year: Never true  Transportation Needs: No Transportation Needs (01/27/2023)   PRAPARE - Administrator, Civil Service (Medical): No    Lack  of Transportation (Non-Medical): No  Physical Activity: Insufficiently Active (12/04/2022)   Exercise Vital Sign    Days of Exercise per Week: 3 days    Minutes of Exercise per Session: 20 min  Stress: No Stress Concern Present (12/04/2022)   [employer] of Occupational Health - Occupational Stress Questionnaire    Feeling of Stress : Not at all  Social Connections: Moderately Integrated (12/04/2022)   Social Connection and Isolation Panel [NHANES]    Frequency of Communication with Friends and Family: Once a week    Frequency of Social Gatherings with Friends and Family: Twice a week    Attends Religious Services: 1 to 4 times per year    Active Member of [employer] or Organizations: Yes    Attends Banker Meetings: 1 to 4 times per year    Marital Status: Widowed  Intimate Partner Violence: Not At Risk (01/21/2023)   Humiliation, Afraid, Rape, and Kick questionnaire    Fear of Current or Ex-Partner: No    Emotionally Abused: No    Physically Abused: No    Sexually Abused: No     Physical Exam   Vitals:   02/01/23 0456 02/01/23 0503  BP:  (!) 93/55  Pulse:  78  Resp: (!) 24 20  Temp: 97.8 F (36.6 C) 97.8 F (36.6 C)  SpO2:  (!) 58%    CONSTITUTIONAL: Chronically ill-appearing, NAD NEURO/PSYCH: Somnolent, wakes to voice, can move all extremities, can follow commands, not oriented to place or time EYES:  eyes equal and reactive ENT/NECK:  no LAD, no JVD CARDIO: Regular rate, well-perfused, normal S1 and S2 PULM:  CTAB no wheezing or rhonchi GI/GU:  non-distended, non-tender MSK/SPINE:  No gross deformities, no edema SKIN:  no rash, atraumatic   *Additional and/or pertinent findings included in MDM below  Diagnostic and Interventional Summary    EKG Interpretation Date/Time:  Saturday February 01 2023 01:18:46 EDT Ventricular Rate:  81 PR Interval:  255 QRS Duration:  120 QT Interval:  438 QTC Calculation: 509 R Axis:   135  Text Interpretation: Sinus  rhythm Prolonged PR interval Nonspecific intraventricular conduction delay Borderline T abnormalities, lateral leads Confirmed by Kennis Carina [phone]) on 02/01/2023 2:54:30 AM       Labs Reviewed  COMPREHENSIVE METABOLIC PANEL - Abnormal; Notable for the following components:      Result Value   Chloride 91 (*)    Glucose, Bld 107 (*)    BUN 49 (*)    Creatinine, Ser 6.34 (*)    Albumin 3.3 (*)    GFR, Estimated 8 (*)    Anion gap 24 (*)  All other components within normal limits  LIPASE, BLOOD - Abnormal; Notable for the following components:   Lipase 92 (*)    All other components within normal limits  PROTIME-INR - Abnormal; Notable for the following components:   Prothrombin Time 17.1 (*)    INR 1.4 (*)    All other components within normal limits  AMMONIA - Abnormal; Notable for the following components:   Ammonia 120 (*)    All other components within normal limits  CBC - Abnormal; Notable for the following components:   RDW 20.1 (*)    Platelets 64 (*)    All other components within normal limits  TROPONIN I (HIGH SENSITIVITY) - Abnormal; Notable for the following components:   Troponin I (High Sensitivity) 64 (*)    All other components within normal limits  CK  MAGNESIUM  BRAIN NATRIURETIC PEPTIDE  TROPONIN I (HIGH SENSITIVITY)    CT HEAD WO CONTRAST ( )  Final Result    DG Chest Port 1 View  Final Result      Medications - No data to display   Procedures  /  Critical Care Procedures  ED Course and Medical Decision Making  Initial Impression and Ddx Patient is a bit off his baseline, has a history of dementia, does not normally know the year.  Just more somnolent than normal and also seems to have significant weakness and inability to ambulate.  Broad differential including electrolyte disturbance, stroke, polypharmacy, UTI  Past medical/surgical history that increases complexity of ED encounter: ESRD, CHF  Interpretation of Diagnostics I  personally reviewed the EKG and my interpretation is as follows: Sinus rhythm without significant change from prior  Labs reveal anticipated BUN and creatinine elevation in the setting of ESRD, mildly elevated troponin, patient's ammonia level is significantly elevated at 120.  Patient Reassessment and Ultimate Disposition/Management     On repeat examination patient does seem to have some mild asterixis.  A bit odd given the otherwise normal LFTs.  Still a bit off his baseline, will request hospitalist admission.  Patient management required discussion with the following services or consulting groups:  Hospitalist Service  Complexity of Problems Addressed Acute illness or injury that poses threat of life of bodily function  Additional Data Reviewed and Analyzed Further history obtained from: Further history from spouse/family member  Additional Factors Impacting ED Encounter Risk Consideration of hospitalization  Elmer Sow. Pilar Plate, MD [facility] Emergency Medicine [facility] [email or web address]  Final Clinical Impressions(s) / ED Diagnoses     ICD-10-CM   1. Altered mental status, unspecified altered mental status type  R41.82       ED Discharge Orders     None        Discharge Instructions Discussed with and Provided to Patient:   Discharge Instructions   None      Sabas Sous, MD 02/01/23 [phone]

## 2023-02-02 DIAGNOSIS — K7682 Hepatic encephalopathy: Secondary | ICD-10-CM | POA: Diagnosis not present

## 2023-02-02 LAB — BASIC METABOLIC PANEL
Anion gap: 17 — ABNORMAL HIGH (ref 5–15)
Anion gap: 19 — ABNORMAL HIGH (ref 5–15)
BUN: 29 mg/dL — ABNORMAL HIGH (ref 8–23)
BUN: 35 mg/dL — ABNORMAL HIGH (ref 8–23)
CO2: 26 mmol/L (ref 22–32)
CO2: 24 mmol/L (ref 22–32)
Calcium: 9.2 mg/dL (ref 8.9–10.3)
Calcium: 9.2 mg/dL (ref 8.9–10.3)
Chloride: 91 mmol/L — ABNORMAL LOW (ref 98–111)
Chloride: 89 mmol/L — ABNORMAL LOW (ref 98–111)
Creatinine, Ser: 4.59 mg/dL — ABNORMAL HIGH (ref 0.61–1.24)
Creatinine, Ser: 5.19 mg/dL — ABNORMAL HIGH (ref 0.61–1.24)
GFR, Estimated: 12 mL/min — ABNORMAL LOW (ref >60)
GFR, Estimated: 11 mL/min — ABNORMAL LOW (ref >60)
Glucose, Bld: 138 mg/dL — ABNORMAL HIGH (ref 70–99)
Glucose, Bld: 201 mg/dL — ABNORMAL HIGH (ref 70–99)
Potassium: 4.2 mmol/L (ref 3.5–5.1)
Potassium: 4.5 mmol/L (ref 3.5–5.1)
Sodium: 134 mmol/L — ABNORMAL LOW (ref 135–145)
Sodium: 132 mmol/L — ABNORMAL LOW (ref 135–145)

## 2023-02-02 LAB — RENAL FUNCTION PANEL
Albumin: 2.9 g/dL — ABNORMAL LOW (ref 3.5–5.0)
Anion gap: 15 (ref 5–15)
BUN: 31 mg/dL — ABNORMAL HIGH (ref 8–23)
CO2: 28 mmol/L (ref 22–32)
Calcium: 8.9 mg/dL (ref 8.9–10.3)
Chloride: 92 mmol/L — ABNORMAL LOW (ref 98–111)
Creatinine, Ser: 4.76 mg/dL — ABNORMAL HIGH (ref 0.61–1.24)
GFR, Estimated: 12 mL/min — ABNORMAL LOW (ref >60)
Glucose, Bld: 100 mg/dL — ABNORMAL HIGH (ref 70–99)
Phosphorus: 3.7 mg/dL (ref 2.5–4.6)
Potassium: 4.5 mmol/L (ref 3.5–5.1)
Sodium: 135 mmol/L (ref 135–145)

## 2023-02-02 LAB — GLUCOSE, CAPILLARY
Glucose-Capillary: 132 mg/dL — ABNORMAL HIGH (ref 70–99)
Glucose-Capillary: 89 mg/dL (ref 70–99)
Glucose-Capillary: 142 mg/dL — ABNORMAL HIGH (ref 70–99)
Glucose-Capillary: 96 mg/dL (ref 70–99)
Glucose-Capillary: 72 mg/dL (ref 70–99)

## 2023-02-02 LAB — C-REACTIVE PROTEIN: CRP: 1.6 mg/dL — ABNORMAL HIGH (ref <1.0)

## 2023-02-02 LAB — CBC
HCT: 35.1 % — ABNORMAL LOW (ref 39.0–52.0)
Hemoglobin: 11.6 g/dL — ABNORMAL LOW (ref 13.0–17.0)
MCH: 30 pg (ref 26.0–34.0)
MCHC: 33 g/dL (ref 30.0–36.0)
MCV: 90.7 fL (ref 80.0–100.0)
Platelets: 89 10*3/uL — ABNORMAL LOW (ref 150–400)
RBC: 3.87 MIL/uL — ABNORMAL LOW (ref 4.22–5.81)
RDW: 19.6 % — ABNORMAL HIGH (ref 11.5–15.5)
WBC: 5.3 10*3/uL (ref 4.0–10.5)
nRBC: 0.6 % — ABNORMAL HIGH (ref 0.0–0.2)

## 2023-02-02 LAB — SEDIMENTATION RATE: Sed Rate: 15 mm/h (ref 0–16)

## 2023-02-02 MED ORDER — LORAZEPAM 2 MG/ML IJ SOLN: 0.5000 mg | Freq: Four times a day (QID) | INTRAMUSCULAR | Status: DC | PRN

## 2023-02-02 NOTE — Progress Notes (Signed)
[facility] Progress Note   Subjective:   Completed dialysis last night - 3L removed.  Seen in room. Eating breakfast, feels "a lot better" this am. Denies cp/sob.   Objective Vitals:   02/01/23 2015 02/01/23 2126 02/02/23 0006 02/02/23 0432  BP: 112/65 99/63 92/62  98/60  Pulse: 96 87 85 80  Resp: (!) 22 16 (!) 23 15  Temp: 98.4 F (36.9 C) 98.1 F (36.7 C) 98.1 F (36.7 C) 97.8 F (36.6 C)  TempSrc: Oral Oral Oral Axillary  SpO2: 98%  96%   Weight:  79.5 kg  79.7 kg  Height:          Additional Objective Labs: Basic Metabolic Panel: Recent Labs  Lab 02/01/23 0306 02/01/23 2102 02/01/23 2315 02/02/23 0040  NA 137  --  134* 135  K 4.4  --  4.2 4.5  CL 91*  --  91* 92*  CO2 22  --  26 28  GLUCOSE 107*  --  138* 100*  BUN 49*  --  29* 31*  CREATININE 6.34*  --  4.59* 4.76*  CALCIUM 9.5  --  9.2 8.9  PHOS  --  2.6  --  3.7   CBC: Recent Labs  Lab 01/31/23 1143 02/01/23 0343 02/02/23 0040  WBC 8.3 6.1 5.3  NEUTROABS 6.3  --   --   HGB 13.1 13.5 11.6*  HCT 41.7 40.9 35.1*  MCV 95.8 94.5 90.7  PLT 134.0 Repeated and verified X2.* 64* 89*   Blood Culture    Component Value Date/Time   SDES BLOOD LEFT ARM 09/03/2019 0919   SDES BLOOD LEFT HAND 09/03/2019 0919   SPECREQUEST  09/03/2019 0919    BOTTLES DRAWN AEROBIC AND ANAEROBIC Blood Culture adequate volume   SPECREQUEST  09/03/2019 0919    BOTTLES DRAWN AEROBIC AND ANAEROBIC Blood Culture adequate volume   CULT  09/03/2019 0919    NO GROWTH 5 DAYS Performed at [facility], [address] [place], [place] [address]    CULT  09/03/2019 0919    NO GROWTH 5 DAYS Performed at [facility], [address] [place], [place] [address]    REPTSTATUS 09/08/2019 FINAL 09/03/2019 0919   REPTSTATUS 09/08/2019 FINAL 09/03/2019 0919     Physical Exam General: Alert, on room air, nad  Heart: RRR Lungs: Clear, normal wob  Abdomen: soft non-tender  Extremities: diffuse  2+ pitting edema to shins Dialysis Access: R AVG +bruit   Medications:   [START ON 02/03/2023] allopurinol  100 mg Oral Once per day on Monday Wednesday Friday   atorvastatin  20 mg Oral Daily   calcitRIOL  1.5 mcg Oral Q T,Th,Sat-1800   Chlorhexidine Gluconate Cloth  6 each Topical Q0600   cinacalcet  60 mg Oral Q T,Th,Sat-1800   donepezil  10 mg Oral QHS   doxycycline  100 mg Oral BID   lactulose  30 g Oral BID   levothyroxine  75 mcg Oral Q0600   midodrine  5 mg Oral TID WC   pantoprazole  40 mg Oral Daily   sertraline  150 mg Oral Daily   sevelamer carbonate  800 mg Oral TID WC   sodium chloride flush  3 mL Intravenous Q12H      OP HD: [facility] TTS  3.5h   400/1.5   79.5kg  2/2.5 bath  AVG   Heparin 4000 - last OP HD 8/15, post wt 81.0kg - coming off 2-6 kg over for past 2.5 wks, closest  was 8/15 - rocaltrol 1.5 mcg po three times per week - sensipar 60mg  po three times per week - no esa, last Hb 12.8     CXR 02/01/23 - IMPRESSION: Low lung volumes with mild, diffusely increased lung markings which are likely, in part, chronic in nature. A mild, superimposed component of interstitial edema cannot be excluded.       Assessment/ Plan: Volume overload/ acute hypoxic resp failure - mild IS edema on CXR and sig LE edema on exam. No resp distress. Net 3L with HD Sat. Is at EDW by weights here but still with volume excess. EDW is probably lower. Will try for extra dialysis Monday if remains in hospital.  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 Extra HD Mon if schedule permits.  Hypotension - chronic, cont midodrine here.   Anemia esrd -  Hb above goal no esa needs.   MBD ckd -Ca/Phos in range - continue sensipar and po vdra.  AMS - appears improved today  Chronic dementia T2DM  -severe hypoglycemia on admission  - per primary  DNR    Tomasa Blase PA-C [facility] 02/02/2023,9:24 AM

## 2023-02-02 NOTE — Evaluation (Signed)
Physical Therapy Evaluation Patient Details Name: Tyler Hahn MRN: [id number] DOB: [birth year] Today's Date: 02/02/2023  History of Present Illness  Pt is an 82 y.o. male admitted 02/01/23 with fatigue, decreased responsiveness and unable to walk or get out of bed. Of note, pt recently admitted 01/21/23 with lactic acidosis, hypoglycemia. PMH includes ESRD on HD, CHF, AICD, HLD, gout, GERD, dementia.  Clinical Impression   Pt admitted with above diagnosis. Lives at home with son and 24 hour caregivers, in a single-level home with a ramped entrance; Prior to his first admission in early August, pt was able to walk with min/contactguard assist, and manage his basic ADLs with supervision; During last admission, pt was able to walk 16-25 ft with min handheld assist; Presents to PT today with generalized weakness, dyscoordination and decr balance with difficulty getting and keeping his center of mass over his feet; Pt needed mod assist to come to sit at EOB, Mod assist of 2 to safely stand and take pivot steps bed to recliner with RW;  This status does represent a significant functional decline since the beginning of August; while this functional decline and his current functional status warrant SNF for post-acute rehab, In considering options for discharge, I value going back to familiar environment, caregivers, and routines for patients with dementia, and it sounds like family would prefer the pt comes home; will continue to follow, and update recs depending on progress and family's wishes; Pt currently with functional limitations due to the deficits listed below (see PT Problem List). Pt will benefit from skilled PT to increase their independence and safety with mobility to allow discharge to the venue listed below.           If plan is discharge home, recommend the following: A lot of help with walking and/or transfers;A lot of help with bathing/dressing/bathroom;Assistance with cooking/housework;Direct  supervision/assist for medications management;Assist for transportation;Supervision due to cognitive status   Can travel by private vehicle        Equipment Recommendations None recommended by PT  Recommendations for Other Services  OT consult    Functional Status Assessment Patient has had a recent decline in their functional status and demonstrates the ability to make significant improvements in function in a reasonable and predictable amount of time.     Precautions / Restrictions Precautions Precautions: Fall Restrictions Weight Bearing Restrictions: No      Mobility  Bed Mobility Overal bed mobility: Needs Assistance Bed Mobility: Supine to Sit     Supine to sit: Mod assist     General bed mobility comments: increased time, cues and mod assist to scoot hips to EOB prior to standing    Transfers Overall transfer level: Needs assistance Equipment used: Rolling walker (2 wheels) Transfers: Sit to/from Stand, Bed to chair/wheelchair/BSC Sit to Stand: Mod assist, +2 safety/equipment   Step pivot transfers: +2 physical assistance, +2 safety/equipment, Mod assist       General transfer comment: Mod assist of 2 for bil support to stand from EOB; tends to have a posterior lean, with feet anterior to center of mass; cues to step for better foot placement to correct; took pivot steps bed to recliner with RW and heavy mod assist due to persistent posterior lean    Ambulation/Gait                  Stairs            Wheelchair Mobility     Tilt Bed  Modified Rankin (Stroke Patients Only)       Balance     Sitting balance-Leahy Scale: Fair       Standing balance-Leahy Scale: Poor Standing balance comment: Persistent posterior lean today                             Pertinent Vitals/Pain Pain Assessment Pain Assessment: No/denies pain Faces Pain Scale: No hurt Pain Intervention(s): Monitored during session    Home Living  Family/patient expects to be discharged to:: Private residence Living Arrangements: Alone;Other (Comment) (24/7 live in caretaker) Available Help at Discharge: Family;Personal care attendant Type of Home: Apartment Home Access: Ramped entrance       Home Layout: One level Home Equipment: Rolling Walker (2 wheels);Cane - single point;Tub bench;Grab bars - tub/shower;Electric scooter Additional Comments: Per Mardene Celeste, caregiver, at home they tend to use handheld assist, and using a RW tends to "get in the way";    Prior Function Prior Level of Function : Needs assist             Mobility Comments: ambulatory with intermittent use of RW vs SPC; family helps pt stay as active as possible, has not needed w/c for community ambulation ADLs Comments: sits to shower, assisted for bathing, can typically dress himself, assist for IADLs; has not been in the shower since early August 2024     Extremity/Trunk Assessment   Upper Extremity Assessment Upper Extremity Assessment: Generalized weakness;Defer to OT evaluation    Lower Extremity Assessment Lower Extremity Assessment: Generalized weakness RLE Deficits / Details: Caregiver reports there is a wound on pt's R great toe, and he is suppoed to wear a boot (she will bring boot in later today); She indicated teh boot does make getting dressed and getting up to walk more complicated    Cervical / Trunk Assessment Cervical / Trunk Assessment: Kyphotic  Communication   Communication Communication: No apparent difficulties Cueing Techniques: Verbal cues;Gestural cues  Cognition Arousal: Alert Behavior During Therapy: WFL for tasks assessed/performed Overall Cognitive Status: History of cognitive impairments - at baseline                                 General Comments: h/o dementia, requires increased cues to follow commands. caregiver reports near baseline cognition        General Comments General comments (skin  integrity, edema, etc.): Noted pt soiled with stool once we began getting up to EOB; assisted with cleaning up both in the bed and standing, so pt would be clean up in the recliner    Exercises     Assessment/Plan    PT Assessment Patient needs continued PT services  PT Problem List Decreased strength;Decreased activity tolerance;Decreased balance;Decreased mobility;Decreased cognition;Cardiopulmonary status limiting activity       PT Treatment Interventions DME instruction;Gait training;Functional mobility training;Therapeutic activities;Therapeutic exercise;Balance training;Patient/family education    PT Goals (Current goals can be found in the Care Plan section)  Acute Rehab PT Goals Patient Stated Goal: Did not specifically state PT Goal Formulation: With patient/family Time For Goal Achievement: 02/16/23 Potential to Achieve Goals: Fair    Frequency Min 1X/week     Co-evaluation               AM-PAC PT "6 Clicks" Mobility  Outcome Measure Help needed turning from your back to your side while in a flat bed without using bedrails?:  A Little Help needed moving from lying on your back to sitting on the side of a flat bed without using bedrails?: A Lot Help needed moving to and from a bed to a chair (including a wheelchair)?: Total Help needed standing up from a chair using your arms (e.g., wheelchair or bedside chair)?: A Lot Help needed to walk in hospital room?: Total Help needed climbing 3-5 steps with a railing? : Total 6 Click Score: 10    End of Session Equipment Utilized During Treatment: Gait belt Activity Tolerance: Patient limited by fatigue Patient left: in chair;with call bell/phone within reach;with chair alarm set;with family/visitor present Nurse Communication: Mobility status PT Visit Diagnosis: Other abnormalities of gait and mobility (R26.89);Muscle weakness (generalized) (M62.81)    Time: 7425-9563 PT Time Calculation (min) (ACUTE ONLY): 34  min   Charges:   PT Evaluation $PT Eval Moderate Complexity: 1 Mod PT Treatments $Therapeutic Activity: 8-22 mins PT General Charges $$ ACUTE PT VISIT: 1 Visit         Van Clines, PT  Acute Rehabilitation Services Office [phone] Secure Chat welcomed   Levi Aland 02/02/2023, 12:45 PM

## 2023-02-02 NOTE — Progress Notes (Signed)
Progress Note   Patient: Tyler Hahn YQM:[id number] DOB: [birth year] DOA: 02/01/2023     1 DOS: the patient was seen and examined on 02/02/2023   Brief hospital course:  Tyler Hahn is a 82 y.o. male with medical history significant of hypertension, hyperlipidemia, systolic congestive heart failure, s/p AICD, chronic hypotension, hypothyroidism, diet-controlled diabetes mellitus type 2, anxiety, and gout who presents after being noted to be acutely altered.   Patient just recently been hospitalized from 8/6-8/9 for fluid overload in the setting of ESRD on HD for which patient was dialyzed on 2 consecutive days.  During hospitalization was noted to have intermittent confusion that would resolve on its own. After getting home  the following week was noted to have moment of confusion where he could not remember how to walk.  Thereafter the next day he was back to his normal self.  Then last night at around 11:30 PM patient was noted to be lethargic, unable to move his feet with complaints of nausea, and reported not to be himself for which she called EMS.   8/17: Patient given extra session of HD, started on lactulose.   Assessment and Plan: Acute encephalopathy Hx of dementia Patient noted to be significantly lethargic yesterday evening for which EMS had been called.  Caregiver notes intermittent episodes of this prior to most recent hospitalization.  CT scan of the head negative for any acute abnormality.  Ammonia level elevated up to 120, but previously noted to be just mildly elevated at 5 a year ago.   Records note signs of probable mild fatty infiltration of the liver on abdominal ultrasound from 2016.  Patient was given lactulose 30 p.o. Pt has been complaint with HD, no clear medication offenders. Per caretaker, patient has history of dementia and oriented to self only usually. Suspect there may be a component of dementia progression as well.  -Neurochecks -Monitor intake and  output -Check abdominal ultrasound complete -Continue lactulose 30 g twice daily.  Continue to monitor and adjust dosing medically appropriate -management of dementia, hypothyroidism and ESRD as below    Acute respiratory failure with hypoxia secondary to fluid overload ESRD on HD O2 saturations noted to be as low as 74% for which patient was placed on 2 L nasal cannula oxygen with improvement greater than 90%.  Chest x-ray noted low lung volumes with concern for interstitial edema.  BNP greater than 4500.  Patient is due for hemodialysis today and there have been noted to have increasing difficulty removing fluid with HD sessions. -Continue calcitriol, cinacalcet, and sevelamer -Nephrology consulted.  Fluid status managed with HD   Chronic hypotension Blood pressures have been noted to be soft 86/55.  Home medication regimen includes midodrine. -Continue midodrine   Systolic congestive heart failure Echocardiogram from 8/7 noted EF to be 25 to 30% with severely dilated left ventricular internal cavity with diastolic function unable to be evaluated. -Fluid management with HD -not on any GDMT   Pressure ulcer of second right toe Patient has been on antibiotics of doxycycline for pressure ulcer of the right second great toe.. -Check ESR and CRP -Check x-ray right second great toe -Continue offloading shoe -Continue doxycycline   Diabetes mellitus type 2 with hypoglycemia, without long-term use of insulin Patient's last hemoglobin A1c is 7.1 on 01/21/2023.  He does not appear to be on any medications for treatment at home.  Blood sugar noted to be 46 while in the ED given amp of D50. POCT Glucoses checked here  on both hands along with BMP, BMP with glucoses 201 with POCT ranged from 96-201.  -Hypoglycemic protocols -CBGs q. before meals and at bedtime (note POCT glucoses are lower than BMP) -Consider discontinuing if blood sugars are well-controlled   Hypothyroidism Last TSH was 9.642 on  01/21/2023, but appears to have been elevated with no significant changes in dosage noted despite elevated levels previously in the past. -increase levothyroxine to 75 mcg   Dementia At baseline patient normally alert and oriented to self.  They are -Delirium precautions -Continue Aricept   Thrombocytopenia Acute on chronic.  Platelet count 64.   Hyperlipidemia -Continue atorvastatin   GERD -Continue pharmacy substitution of Protonix for omeprazole   DVT prophylaxis: SCDs Advance Care Planning:   Code Status: DNR     Consults: Nephrology   Family Communication:     Severity of Illness: The appropriate patient status for this patient is INPATIENT. Inpatient status is judged to be reasonable and necessary in order to provide the required intensity of service to ensure the patient's safety. The patient's presenting symptoms, physical exam findings, and initial radiographic and laboratory data in the context of their chronic comorbidities is felt to place them at high risk for further clinical deterioration. Furthermore, it is not anticipated that the patient will be medically stable for discharge from the hospital within 2 midnights of admission.    * I certify that at the point of admission it is my clinical judgment that the patient will require inpatient hospital care spanning beyond 2 midnights from the point of admission due to high intensity of service, high risk for further deterioration and high frequency of surveillance required.*       Subjective: No complaints. History obtained from caregiver, who has lived with patient for the past 1 year. Slow decline over the past year, past few weeks has been harder. Has been not pulling off as much fluid during HD.   Physical Exam: Vitals:   02/02/23 0006 02/02/23 0432 02/02/23 0800 02/02/23 1137  BP: 92/62 98/60 111/63 107/61  Pulse: 85 80 90   Resp: (!) 23 15 18 20   Temp: 98.1 F (36.7 C) 97.8 F (36.6 C) 97.9 F (36.6 C)    TempSrc: Oral Axillary Axillary   SpO2: 96%  97% 98%  Weight:  79.7 kg    Height:       Physical Exam Vitals and nursing note reviewed.  Constitutional:      General: He is not in acute distress.    Appearance: He is not diaphoretic.  HENT:     Head: Atraumatic.  Eyes:     Pupils: Pupils are equal, round, and reactive to light.  Cardiovascular:     Rate and Rhythm: Normal rate and regular rhythm.     Pulses: Normal pulses.     Heart sounds: No murmur heard. Pulmonary:     Effort: Pulmonary effort is normal. No respiratory distress.     Breath sounds: Normal breath sounds. No rales.  Abdominal:     General: Abdomen is flat. There is no distension.     Palpations: Abdomen is soft.     Tenderness: There is no abdominal tenderness. There is no rebound.  Musculoskeletal:     Right lower leg: Edema present.     Left lower leg: Edema present.  Skin:    General: Skin is warm and dry.     Capillary Refill: Capillary refill takes less than 2 seconds.  Neurological:     Mental  Status: He is alert. Mental status is at baseline. He is disoriented and confused.  Psychiatric:        Mood and Affect: Mood normal.     Data Reviewed:     Latest Ref Rng & Units 02/02/2023   12:40 AM 02/01/2023    3:43 AM 01/31/2023   11:43 AM  CBC  WBC 4.0 - 10.5 K/uL 5.3  6.1  8.3   Hemoglobin 13.0 - 17.0 g/dL 82.9  56.2  13.0   Hematocrit 39.0 - 52.0 % 35.1  40.9  41.7   Platelets 150 - 400 K/uL 89  64  134.0 Repeated and verified X2.       Latest Ref Rng & Units 02/02/2023   10:34 AM 02/02/2023   12:40 AM 02/01/2023   11:15 PM  BMP  Glucose 70 - 99 mg/dL 865  784  696   BUN 8 - 23 mg/dL 35  31  29   Creatinine 0.61 - 1.24 mg/dL 2.95  2.84  1.32   Sodium 135 - 145 mmol/L 132  135  134   Potassium 3.5 - 5.1 mmol/L 4.5  4.5  4.2   Chloride 98 - 111 mmol/L 89  92  91   CO2 22 - 32 mmol/L 24  28  26    Calcium 8.9 - 10.3 mg/dL 9.2  8.9  9.2      Family Communication: d/w  caregiver  Disposition: Status is: Inpatient Remains inpatient appropriate because: on going confusion  Planned Discharge Destination: Home with Home Health    Time spent: 50 minutes  Author: Charolotte Eke, MD 02/02/2023 1:56 PM  For on call review [email or web address].

## 2023-02-02 NOTE — Plan of Care (Signed)
  Problem: Education: Goal: Knowledge of General Education information will improve Description: Including pain rating scale, medication(s)/side effects and non-pharmacologic comfort measures Outcome: Progressing   Problem: Clinical Measurements: Goal: Will remain free from infection Outcome: Progressing   Problem: Coping: Goal: Level of anxiety will decrease Outcome: Progressing   Problem: Elimination: Goal: Will not experience complications related to urinary retention Outcome: Progressing   Problem: Pain Managment: Goal: General experience of comfort will improve Outcome: Progressing   Problem: Safety: Goal: Ability to remain free from injury will improve Outcome: Progressing   Problem: Skin Integrity: Goal: Risk for impaired skin integrity will decrease Outcome: Progressing   Problem: Health Behavior/Discharge Planning: Goal: Ability to manage health-related needs will improve Outcome: Not Progressing   Problem: Clinical Measurements: Goal: Ability to maintain clinical measurements within normal limits will improve Outcome: Not Progressing Goal: Diagnostic test results will improve Outcome: Not Progressing Goal: Respiratory complications will improve Outcome: Not Progressing Goal: Cardiovascular complication will be avoided Outcome: Not Progressing   Problem: Activity: Goal: Risk for activity intolerance will decrease Outcome: Not Progressing   Problem: Nutrition: Goal: Adequate nutrition will be maintained Outcome: Not Progressing   Problem: Elimination: Goal: Will not experience complications related to bowel motility Outcome: Not Progressing

## 2023-02-02 NOTE — Progress Notes (Signed)
Bedside nurse reported that blood glucose is 18.  Found out that patient has multiple episodes of hypoglycemia when blood glucose dropped to 17 and 46.  He Per chart review did not have any history of diabetes and not on any oral sulfonylurea. -Checking POC blood glucose check every 4 hours and continue as needed D50 bolus if blood glucose less than 70.  Checking blood glucose now again if it continues to remain low we will start D5 W IV 50 cc/h. -Checking C-peptide level, random blood insulin level and insulin like growth factor.   Tereasa Coop, MD [facility] 02/02/2023, 12:52 AM

## 2023-02-03 ENCOUNTER — Telehealth: Payer: Self-pay

## 2023-02-03 DIAGNOSIS — Z515 Encounter for palliative care: Secondary | ICD-10-CM | POA: Diagnosis not present

## 2023-02-03 DIAGNOSIS — Z7189 Other specified counseling: Secondary | ICD-10-CM | POA: Diagnosis not present

## 2023-02-03 DIAGNOSIS — K7682 Hepatic encephalopathy: Secondary | ICD-10-CM | POA: Diagnosis not present

## 2023-02-03 LAB — BASIC METABOLIC PANEL
Anion gap: 22 — ABNORMAL HIGH (ref 5–15)
BUN: 49 mg/dL — ABNORMAL HIGH (ref 8–23)
CO2: 21 mmol/L — ABNORMAL LOW (ref 22–32)
Calcium: 8.9 mg/dL (ref 8.9–10.3)
Chloride: 90 mmol/L — ABNORMAL LOW (ref 98–111)
Creatinine, Ser: 6.58 mg/dL — ABNORMAL HIGH (ref 0.61–1.24)
GFR, Estimated: 8 mL/min — ABNORMAL LOW (ref >60)
Glucose, Bld: 135 mg/dL — ABNORMAL HIGH (ref 70–99)
Potassium: 4.8 mmol/L (ref 3.5–5.1)
Sodium: 133 mmol/L — ABNORMAL LOW (ref 135–145)

## 2023-02-03 LAB — GLUCOSE, CAPILLARY
Glucose-Capillary: 152 mg/dL — ABNORMAL HIGH (ref 70–99)
Glucose-Capillary: <10 mg/dL — CL (ref 70–99)
Glucose-Capillary: 168 mg/dL — ABNORMAL HIGH (ref 70–99)

## 2023-02-03 LAB — C-PEPTIDE: C-Peptide: 9.5 ng/mL — ABNORMAL HIGH (ref 1.1–4.4)

## 2023-02-03 LAB — INSULIN, RANDOM: Insulin: 2.9 u[IU]/mL (ref 2.6–24.9)

## 2023-02-03 LAB — CBC
HCT: 44.3 % (ref 39.0–52.0)
Hemoglobin: 14.4 g/dL (ref 13.0–17.0)
MCH: 29.8 pg (ref 26.0–34.0)
MCHC: 32.5 g/dL (ref 30.0–36.0)
MCV: 91.7 fL (ref 80.0–100.0)
Platelets: 100 10*3/uL — ABNORMAL LOW (ref 150–400)
RBC: 4.83 MIL/uL (ref 4.22–5.81)
RDW: 20.2 % — ABNORMAL HIGH (ref 11.5–15.5)
WBC: 8.8 10*3/uL (ref 4.0–10.5)
nRBC: 0.5 % — ABNORMAL HIGH (ref 0.0–0.2)

## 2023-02-03 MED ORDER — LIDOCAINE-PRILOCAINE 2.5-2.5 % EX CREA
1.0000 | TOPICAL_CREAM | CUTANEOUS | Status: DC | PRN
  Filled 2023-02-03: qty 5

## 2023-02-03 MED ORDER — LIDOCAINE HCL (PF) 1 % IJ SOLN: 5.0000 mL | INTRAMUSCULAR | Status: DC | PRN

## 2023-02-03 MED ORDER — HEPARIN SODIUM (PORCINE) 1000 UNIT/ML DIALYSIS
2000.0000 [IU] | Freq: Once | INTRAMUSCULAR | Status: AC
  Administered 2023-02-03: 2000 [IU] via INTRAVENOUS_CENTRAL
  Filled 2023-02-03: qty 2

## 2023-02-03 MED ORDER — ALTEPLASE 2 MG IJ SOLR
2.0000 mg | Freq: Once | INTRAMUSCULAR | Status: DC | PRN
  Filled 2023-02-03: qty 2

## 2023-02-03 MED ORDER — HEPARIN SODIUM (PORCINE) 1000 UNIT/ML DIALYSIS
1000.0000 [IU] | INTRAMUSCULAR | Status: DC | PRN
  Filled 2023-02-03: qty 1

## 2023-02-03 MED ORDER — ANTICOAGULANT SODIUM CITRATE 4% (200MG/5ML) IV SOLN
5.0000 mL | Status: DC | PRN
  Filled 2023-02-03: qty 5

## 2023-02-03 MED ORDER — HEPARIN SODIUM (PORCINE) 1000 UNIT/ML DIALYSIS
2000.0000 [IU] | INTRAMUSCULAR | Status: DC | PRN
  Filled 2023-02-03: qty 2

## 2023-02-03 MED ORDER — PROSOURCE PLUS PO LIQD
30.0000 mL | Freq: Two times a day (BID) | ORAL | Status: DC
  Administered 2023-02-03 – 2023-02-05 (×4): 30 mL via ORAL
  Filled 2023-02-03 (×4): qty 30

## 2023-02-03 MED ORDER — HEPARIN SODIUM (PORCINE) 1000 UNIT/ML DIALYSIS
4000.0000 [IU] | INTRAMUSCULAR | Status: DC | PRN
  Filled 2023-02-03: qty 4

## 2023-02-03 MED ORDER — PENTAFLUOROPROP-TETRAFLUOROETH EX AERO: 1.0000 | INHALATION_SPRAY | CUTANEOUS | Status: DC | PRN

## 2023-02-03 MED ORDER — HEPARIN SODIUM (PORCINE) 1000 UNIT/ML DIALYSIS
2000.0000 [IU] | Freq: Once | INTRAMUSCULAR | Status: DC
  Filled 2023-02-03 (×2): qty 2

## 2023-02-03 NOTE — Consult Note (Signed)
Palliative Medicine Inpatient Consult Note  Consulting Provider:  Lanae Boast, MD   Reason for consult:   Palliative Care Consult Services Palliative Medicine Consult  Reason for Consult? GOC. family request   02/03/2023  HPI:  Per intake H&P --> Pt is an 82 y.o. male admitted 02/01/23 with fatigue, decreased responsiveness and unable to walk or get out of bed. Of note, pt recently admitted 01/21/23 with lactic acidosis, hypoglycemia. PMH includes ESRD on HD, CHF, AICD, HLD, gout, GERD, dementia.   Palliative care has been asked to get involved to re-address goals of care.  Clinical Assessment/Goals of Care:  *Please note that this is a verbal dictation therefore any spelling or grammatical errors are due to the "Dragon Medical One" system interpretation.  II have reviewed medical records including EPIC notes, labs and imaging, received report from bedside RN, assessed the patient who is lying in bed in NAD.    I met with patient's son, Ethelene Browns and daughter to further discuss diagnosis prognosis, GOC, [name], disposition and options.   I introduced Palliative Medicine as specialized medical care for people living with serious illness. It focuses on providing relief from the symptoms and stress of a serious illness. The goal is to improve quality of life for both the patient and the family.   Medical History Review and Understanding:   A review of [name] past medical history inclusive of dementia, type 2 diabetes, hypertension, congestive heart failure with AICD, hyperlipidemia, and end-stage renal disease was complete.   Social History:   Ordie lives in [place].  He is a widower.  He has 4 children Shanon Ace, Isaiah Serge, and Eakly.  He has 12 grandchildren and 3 great-grandchildren.  He formerly worked as a Scientist, water quality at [employer].  He is a man of faith and practices within Christianity.   Functional and Nutritional State:   Preceding hospitalization  Bernel was living in an apartment he has a care aide with him, Joella around-the-clock and when she is unable to be present his son Christiane Ha is there.  Patient's family also take turns on the weekends caring for Dontai.  He is mobile and has both a cane and a walker but does not use them consistently.  He has suffered a few falls over the last year.  He has had a fairly decent appetite.   Advance Directives:   A detailed discussion was had today regarding advanced directives. Patients HCPOA is his son, Christiane Ha.   Code Status:   Concepts specific to code status, artifical feeding and hydration, continued IV antibiotics and rehospitalization was had.  The difference between a aggressive medical intervention path  and a palliative comfort care path for this patient at this time was had.    Deforrest is an established DO NOT RESUSCITATE DO NOT INTUBATE CODE STATUS.   Discussion:   Patients children are most concerned with his poor tolerance to the OP dialysis clinic. They share worries in regards to them "stopping treatments early and him having bleeding episodes". Patients son and daughter feel that he tolerates hemodialysis much better in the hospital setting and question why this is.   Confirmed the goal for Duran is to live a meaningful life for as long as able. If the burdens of dialysis outweigh the benefits patients family would not desire for him to suffer through treatments.   Plan to meet with patients son, Christiane Ha in the oncoming days for further conversations in the meanwhile will communicate with the renal  navigator to support family in their request to switch dialysis centers.   Discussed the importance of continued conversation with family and their  medical providers regarding overall plan of care and treatment options, ensuring decisions are within the context of the patients values and GOCs.   Decision Maker: Amber, Schillig (Son): [phone] (Home Phone)    SUMMARY OF  RECOMMENDATIONS   DNAR/DNI   Patients family hopeful for new dialysis clinic  Recommend OP Palliative support in addition to referral to Dementia Care services  Additional conversations with patients family - Almyra Brace in the oncoming days   Ongoing support by the PMT  Code Status/Advance Care Planning: DNAR/DNI  Palliative Prophylaxis:  Aspiration, Bowel Regimen, Delirium Protocol, Frequent Pain Assessment, Oral Care, Palliative Wound Care, and Turn Reposition  Additional Recommendations (Limitations, Scope, Preferences): Continue current care  Psycho-social/Spiritual:  Desire for further Chaplaincy support: Yes Additional Recommendations: Education on disease progression.    Prognosis:  Has multiple co-morbidities, has frailty. Increased 12 month mortality risk.   Discharge Planning: Discharge will likely be to home  Vitals:   02/03/23 0800 02/03/23 1050  BP: 110/66   Pulse: 84 91  Resp: 18 20  Temp: 97.7 F (36.5 C) 97.6 F (36.4 C)  SpO2:      Intake/Output Summary (Last 24 hours) at 02/03/2023 1609 Last data filed at 02/03/2023 0981 Gross per 24 hour  Intake 200 ml  Output --  Net 200 ml   Last Weight  Most recent update: 02/03/2023  6:15 AM    Weight  80.2 kg (176 lb 12.9 oz)            Gen:  Elderly AA M in NAD HEENT: moist mucous membranes CV: Regular rate and rhythm  PULM:  Breathing comfortably on RA ABD: soft/nontender  EXT: No edema  Neuro: Alert to person and place, in good spirits  PPS: 50%   This conversation/these recommendations were discussed with patient primary care team, Dr. Jonathon Bellows  Billing based on MDM: High  Problems Addressed: One acute or chronic illness or injury that poses a threat to life or bodily function  Amount and/or Complexity of Data: Category 3:Discussion of management or test interpretation with external physician/other qualified health care professional/appropriate source (not separately reported)  Risks:  Decision not to resuscitate or to de-escalate care because of poor prognosis ______________________________________________________ Tyler Hahn [place] Palliative Medicine Team Team Cell Phone: [phone] Please utilize secure chat with additional questions, if there is no response within 30 minutes please call the above phone number  Palliative Medicine Team providers are available by phone from 7am to 7pm daily and can be reached through the team cell phone.  Should this patient require assistance outside of these hours, please call the patient's attending physician.

## 2023-02-03 NOTE — Progress Notes (Signed)
[facility] Progress Note   Subjective:   Seen in room - feels well. For extra HD today for overload. Off O2 at this point, although still with DOE.   Objective Vitals:   02/03/23 0100 02/03/23 0500 02/03/23 0600 02/03/23 0800  BP: 111/62  (!) 120/97   Pulse: 84  85 84  Resp:   12   Temp: 98.4 F (36.9 C)  98.5 F (36.9 C) 97.7 F (36.5 C)  TempSrc: Oral  Oral Oral  SpO2:   95%   Weight:  80.2 kg    Height:       Physical Exam General: Well appearing man, NAD Heart: RRR; 2/6 murmur Lungs: CTA laterally Abdomen: soft Extremities: 4+ BLE edema to knees; 1+ dependent edema in hips Dialysis Access: RUE AVG + bruit  Additional Objective Labs: Basic Metabolic Panel: Recent Labs  Lab 02/01/23 2102 02/01/23 2315 02/02/23 0040 02/02/23 1034  NA  --  134* 135 132*  K  --  4.2 4.5 4.5  CL  --  91* 92* 89*  CO2  --  26 28 24   GLUCOSE  --  138* 100* 201*  BUN  --  29* 31* 35*  CREATININE  --  4.59* 4.76* 5.19*  CALCIUM  --  9.2 8.9 9.2  PHOS 2.6  --  3.7  --    Liver Function Tests: Recent Labs  Lab 02/01/23 0306 02/02/23 0040  AST 31  --   ALT 23  --   ALKPHOS 104  --   BILITOT 0.5  --   PROT 8.0  --   ALBUMIN 3.3* 2.9*   Recent Labs  Lab 02/01/23 0306  LIPASE 92*   CBC: Recent Labs  Lab 01/31/23 1143 02/01/23 0343 02/02/23 0040 02/03/23 0925  WBC 8.3 6.1 5.3 8.8  NEUTROABS 6.3  --   --   --   HGB 13.1 13.5 11.6* 14.4  HCT 41.7 40.9 35.1* 44.3  MCV 95.8 94.5 90.7 91.7  PLT 134.0 Repeated and verified X2.* 64* 89* 100*   CBG: Recent Labs  Lab 02/02/23 0057 02/02/23 0520 02/02/23 1034 02/02/23 1037 02/02/23 1658  GLUCAP 132* 89 142* 96 72   Studies/Results: US Abdomen Complete  Result Date: 02/01/2023 CLINICAL DATA:  Pancreatitis, elevated lipase. EXAM: ABDOMEN ULTRASOUND COMPLETE COMPARISON:  Renal ultrasound dated 08/17/2019 and CT abdomen and pelvis dated 01/21/2023. FINDINGS: Gallbladder: No gallstones visualized. There  is questionable gallbladder wall thickening up to 5 mm. No sonographic Murphy sign noted by sonographer. Common bile duct: Diameter: 3 mm Liver: No focal lesion identified. Within normal limits in parenchymal echogenicity. Portal vein is patent on color Doppler imaging with normal direction of blood flow towards the liver. IVC: Not well seen. Pancreas: Not well seen. Spleen: Size and appearance within normal limits. Right Kidney: Length: 8.2 cm. Echogenicity is increased and the kidney appears atrophic. No mass or hydronephrosis visualized. Left Kidney: Not visualized. Abdominal aorta: No aneurysm visualized. Other findings: None. IMPRESSION: 1. Questionable gallbladder wall thickening. No gallstones visualized. 2. Atrophic right kidney. Electronically Signed   By: Romona Curls M.D.   On: 02/01/2023 12:14   DG Toe 2nd Right  Result Date: 02/01/2023 CLINICAL DATA:  Pressure ulcer. History of end-stage kidney disease and diabetes. Dementia. EXAM: RIGHT SECOND TOE COMPARISON:  None Available. FINDINGS: There is no signs of acute fracture or dislocation. No focal bone erosions identified. Diffuse soft tissue edema is noted. Second through fifth hammertoe deformities identified. IMPRESSION: 1. No acute bone  abnormality. 2. Diffuse soft tissue edema. 3. Second through fifth hammertoe deformities. Electronically Signed   By: Signa Kell M.D.   On: 02/01/2023 10:51    Medications:   allopurinol  100 mg Oral Once per day on Monday Wednesday Friday   atorvastatin  20 mg Oral Daily   calcitRIOL  1.5 mcg Oral Q T,Th,Sat-1800   Chlorhexidine Gluconate Cloth  6 each Topical Q0600   cinacalcet  60 mg Oral Q T,Th,Sat-1800   donepezil  10 mg Oral QHS   doxycycline  100 mg Oral BID   lactulose  30 g Oral BID   levothyroxine  75 mcg Oral Q0600   midodrine  5 mg Oral TID WC   pantoprazole  40 mg Oral Daily   sertraline  150 mg Oral Daily   sevelamer carbonate  800 mg Oral TID WC   sodium chloride flush  3 mL  Intravenous Q12H    Dialysis Orders: TTS at [facility] 3.5hr, 400/A1.5, EDW 79.5kg, 2K/2.5Ca bath, AVG, heparin 4000 unit bolus - Not reaching EDW prior to admit - typically 2-6kg up for past 3 weeks - Calcitriol 1.3mcg PO q HD - Sensipar 60mg  PO q HD - No ESA  Assessment/Plan: 1. Acute Hypoxic Resp Failure: Pulm edema/LE edema on exam, not in respiratory distress. S/p HD 8/17 with 3L off. Plan is for extra HD today, then back to usual schedule. On room air at this time. 2. ESRD: Usual TTS schedule - as above, for extra HD today for volume. 3. HypoTN/volume: Chronically low BP on mido 5mg  TID, UF as tolerated. May need to ^ mido to 10mg  4. Anemia of ESRD: Hgb > 12, no ESA needed. Abd [place] today without renal masses. 5. Secondary hyperparathyroidism:  CorrCa high side, Phos good. Continue Sevelamer as binder and watch Ca for now - may need to reduce VDRA 6. Nutrition: Alb low, adding supplements. 7. Dementia 8. T2DM 9. DNR  Ozzie Hoyle, PA-C 02/03/2023, 10:21 AM  [facility]

## 2023-02-03 NOTE — Consult Note (Signed)
   [facility] CM Inpatient Consult   02/03/2023  Tyler Hahn Jan 17, 1941 [id number]  [facility] [[facility]]  Accountable Care Organization [ACO] Patient: Tyler Hahn ACO  Primary Care Provider: Sheliah Hatch, MD, [place] at [facility]  Patient is currently active with [facility] [[facility]] Care Management for chronic disease management services.  Patient has been assigned by a Airline pilot. Our community based plan of care has focused on disease management and community resource support.  Patient was discussed in morning progression meeting admitted with acute hepatic encephalopathy.  Met with patient and caregiver, Tyler Hahn, at the bedside.  Patient alert, pleasant, and up in recliner.  Explained reason for the visit and active with [facility] Coordinator on behalf of [facility].  Ms Loreta Ave states and acknowledges appointment with RN CC was for today.  She states only concerns was for a Palliative consult which didn't happen and wanted to know if it could be ordered while in the hospital.  Explained will discuss with inpatient [facility] team, as well.    Patient will receive a post hospital call and will be evaluated for assessments and disease process education.  Let inpatient [facility] RNCM know of palliative request and ongoing follow up  Plan: Made Inpatient Transition Of Care [TOC] team member to make aware that [facility] Care Management following.   Of note, [facility] Care Management services does not replace or interfere with any services that are needed or arranged by inpatient [name] care management team.   For additional questions or referrals please contact:  Charlesetta Shanks, RN BSN CCM [facility]  [phone] business mobile phone Toll free office [phone]  Fax number: [phone] [email or web address]

## 2023-02-03 NOTE — Progress Notes (Signed)
   02/03/23 2002  Vitals  Temp 97.6 F (36.4 C)  Pulse Rate 91  Resp 18  BP 120/72  SpO2 97 %  O2 Device Nasal Cannula  Oxygen Therapy  Patient Activity (if Appropriate) In bed  Pulse Oximetry Type Continuous  O2 Flow Rate (L/min) 2 L/min  Post Treatment  Dialyzer Clearance Clear  Hemodialysis Intake (mL) 0 mL  Liters Processed 67.2  Fluid Removed (mL) 2000 mL  Tolerated HD Treatment Yes  Post-Hemodialysis Comments Pt. tolerated Procedure well. VSS and pt inct of stool, Gown changed and Pads,sheet and 3E bedside  Kristine Royal aware of situation. Admin Medication per order  AVG/AVF Arterial Site Held (minutes) 10 minutes  AVG/AVF Venous Site Held (minutes) 5 minutes  During Treatment Monitoring  Duration of HD Treatment -hour(s) 2.5 hour(s)   Received patient in bed to unit.  Alert and oriented.  Informed consent signed and in chart.   TX duration: 2.5  Patient tolerated well.  Transported back to the room  Alert, without acute distress.  Hand-off given to patient's nurse.   Access used: Yes Access issues: No  Total UF removed: 2000 Medication(s) given: See MAR Post HD VS: See Above Grid Post HD weight: 77.5 kg   [facility]

## 2023-02-03 NOTE — Progress Notes (Signed)
PROGRESS NOTE KAMIR SCHUNEMAN  UJW:[id number] DOB: Oct 23, 1940 DOA: 02/01/2023 PCP: Sheliah Hatch, MD  Brief Narrative/Hospital Course: 6 yom w/ HTN,HLD, systolic CHF s/p AICD, chronic hypotension, hypothyroidism, diet-controlled diabetes mellitus type 2, anxiety, and gout  presented after being noted to be acutely altered During admission more alert, but only oriented to self been which appears to be his baseline>additional history is obtained from his caregiver over the phone. Recent hospitalization 8/6-8/9 >fluid overload in the setting of ESRD on HD, got HD x2, had intermittent confusion that would resolve on its own.After getting home  the following week was noted to have moment of confusion where he could not remember how to walk>next day he was back to his normal self. Around 11:30 PM 8/16 patient was noted to be lethargic, unable to move his feet with complaints of nausea, and reported not to be himself for which she called EMS. He had been on antibiotics for a pressure ulcer of his second right toe.  In ED: [id number] with respirations 12-24,BP 86/55-116/64, and O2 saturations noted to be as low as 74% with improvement on 2 L of [place]. Labs significant for platelets 64, potassium 4.4, CO2 22, BUN 49, creatinine 6.34, anion gap 24, lipase 92, ammonia 120, INR 1.4, BNP greater than 4500 CXR>diffuse increased interstitial markings thought to be chronic in nature with possible superimposed interstitial edema. CT scan of the head noted generalized cerebral atrophy with chronic white matter small vessel ischemic changes, but otherwise no acute abnormality.  Patient was given lactulose 30 g p.o. He underwent workup for confusion and unable to walk/limp.    Subjective: Seen thsi am On bedside chair Caregiver at bedside who feels he is back to baseline On RA He has no complaints   Assessment and Plan: Principal Problem:   Acute hepatic encephalopathy (HCC) Active Problems:   ESRD on  dialysis (HCC)   Acute respiratory failure with hypoxia (HCC)   Chronic hypotension   SYSTOLIC HEART FAILURE, CHRONIC   Pressure ulcer of toe   Type 2 diabetes mellitus with hyperlipidemia (HCC)   Hypothyroid   Dementia (HCC)   Thrombocytopenia (HCC)    Acute encephalopathy History of dementia: Patient having intermittent confusion acute on chronic with dementia, likely multifactorial in the setting of hypoxia fluid overload, and hyperammonemia-Workup so far with CT head negative ammonia level 120 but only mild fatigue presumed liver of the abdomen on ultrasound.  A 16, given lactulose, continue HD. Continue supportive care neurocheck.  Ultrasound abdomen 8/17 questionable gallbladder wall thickening no gallstone, atrophic right kidney no focal lesion of the liver.  He is back to baseline.  Pseudo Hypoglycemia Type 2 diabetes mellitus: A1c 7.1 on 8/6, not on any meds at home.  Having hypoglycemia on cbg check on finger stick while blood glucose check on lab at the same time shows normal or high glucose.  Discontinue further CBG check from fingerstick can use ear lobe or BMP.  Taking p.o. not on antihyperglycemic or insulin. Recent Labs  Lab 02/02/23 1034 02/02/23 1037 02/02/23 1658 02/03/23 1045 02/03/23 1048  GLUCAP 142* 96 72 <10* 152*    Acute respiratory failure with hypoxia with fluid overload ESRD on HD TTS Chronic systolic CHF: Fluid being managed by nephrology with HD, extra HD 8/19 if schedule permits.  EF 25 to 30% on echo 8/7 with severely dilated left ventricular internal cavity and diastolic function not on GDMT in the setting of hypotension and ESRD. Cont to monitor daily I/O,weight, electrolytes and  net balance as below.Keep on  salt/fluid restricted diet and monitor in tele. Net IO Since Admission: 200 mL [02/03/23 1249]  Filed Weights   02/01/23 2126 02/02/23 0432 02/03/23 0500  Weight: 79.5 kg 79.7 kg 80.2 kg    Recent Labs  Lab 02/01/23 0306 02/01/23 0343  02/01/23 2315 02/02/23 0040 02/02/23 1034 02/03/23 0925  BNP  --  >4,500.0*  --   --   --   --   BUN 49*  --  29* 31* 35* 49*  CREATININE 6.34*  --  4.59* 4.76* 5.19* 6.58*  K 4.4  --  4.2 4.5 4.5 4.8  MG 2.0  --   --   --   --   --     Anemia of ESRD: Monitor hemoglobin transfuse for less than 7 g.  Metabolic bone disease of CKD-calcium/Phos advised on Sensipar and p.o. PPI  Chronic hypotension: On midodrine chronically.  Monitor  Pressure ulcer of second rt toe Caregiver feels to wound appears better.  Continues on doxycycline, offloading shoe and follow-up with podiatry.  ESR and CRP are relatively normal/slightly up crp  Thrombocytopenia: Acute on chronic.monitor Recent Labs  Lab 01/31/23 1143 02/01/23 0343 02/02/23 0040 02/03/23 0925  PLT 134.0 Repeated and verified X2.* 64* 89* 100*    Hypothyroidism: Continue Synthroid-dose was increased to 75 mcg.  TSH 9.6 will need recheck fast outpatient  Hyperlipidemia: Cont statins  GERD: Continue PPI  Goals of care: Continue supportive care DNR.  Per family request palliative care has been consulted  DVT prophylaxis: SCDs Start: 02/01/23 0951 Code Status:   Code Status: DNR Family Communication: plan of care discussed with patient/caregiver at bedside. Patient status is: Patient because of encephalopathy Level of care: Telemetry Medical   Dispo: The patient is from: home w/ caregiver            Anticipated disposition: TBD.  Continue PT OT Objective: Vitals last 24 hrs: Vitals:   02/03/23 0500 02/03/23 0600 02/03/23 0800 02/03/23 1050  BP:  (!) 120/97 110/66   Pulse:  85 84 91  Resp:  12 18 20   Temp:  98.5 F (36.9 C) 97.7 F (36.5 C) 97.6 F (36.4 C)  TempSrc:  Oral Oral Oral  SpO2:  95%    Weight: 80.2 kg     Height:       Weight change: -4.2 kg  Physical Examination: General exam: alert awake, pleasantly confused older than stated age HEENT:Oral mucosa moist, Ear/Nose WNL grossly Respiratory  system: bilaterally CLEAR BS, no use of accessory muscle Cardiovascular system: S1 & S2 +, No JVD. Gastrointestinal system: Abdomen soft,NT,ND, BS+ Nervous System:Alert, awake, moving extremities. Extremities: LE edema NEG,distal peripheral pulses palpable.  Skin: No rashes,no icterus. MSK: Normal muscle bulk,tone, power  Medications reviewed:  Scheduled Meds:  (feeding supplement) PROSource Plus  30 mL Oral BID BM   allopurinol  100 mg Oral Once per day on Monday Wednesday Friday   atorvastatin  20 mg Oral Daily   calcitRIOL  1.5 mcg Oral Q T,Th,Sat-1800   Chlorhexidine Gluconate Cloth  6 each Topical Q0600   cinacalcet  60 mg Oral Q T,Th,Sat-1800   donepezil  10 mg Oral QHS   doxycycline  100 mg Oral BID   lactulose  30 g Oral BID   levothyroxine  75 mcg Oral Q0600   midodrine  5 mg Oral TID WC   pantoprazole  40 mg Oral Daily   sertraline  150 mg Oral Daily   sevelamer  carbonate  800 mg Oral TID WC   sodium chloride flush  3 mL Intravenous Q12H  Continuous Infusions:   Diet Order             Diet Heart Room service appropriate? Yes; Fluid consistency: Thin  Diet effective now                   Intake/Output Summary (Last 24 hours) at 02/03/2023 1115 Last data filed at 02/03/2023 1478 Gross per 24 hour  Intake 200 ml  Output --  Net 200 ml   Net IO Since Admission: 200 mL [02/03/23 1115]  Wt Readings from Last 3 Encounters:  02/03/23 80.2 kg  01/31/23 82.2 kg  01/25/23 79.4 kg     Unresulted Labs (From admission, onward)     Start     Ordered   02/03/23 1031  Hepatitis B surface antigen  (New Admission Hemo Labs (Hepatitis B))  Once,   R       Question:  Specimen collection method  Answer:  Lab=Lab collect   02/03/23 1033   02/03/23 1031  Hepatitis B surface antibody,quantitative  (New Admission Hemo Labs (Hepatitis B))  Once,   R       Question:  Specimen collection method  Answer:  Lab=Lab collect   02/03/23 1033   02/02/23 1010  Basic metabolic panel   Now then every 8 hours,   R (with TIMED occurrences)      02/02/23 1009   02/02/23 0051  Insulin, random  Add-on,   AD        02/02/23 0051   02/02/23 0051  Insulin-like growth factor  Add-on,   AD        02/02/23 0051   02/02/23 0008  MRSA Next Gen by PCR, Nasal  Once,   R        02/02/23 0007          Data Reviewed: I have personally reviewed following labs and imaging studies CBC: Recent Labs  Lab 01/31/23 1143 02/01/23 0343 02/02/23 0040 02/03/23 0925  WBC 8.3 6.1 5.3 8.8  NEUTROABS 6.3  --   --   --   HGB 13.1 13.5 11.6* 14.4  HCT 41.7 40.9 35.1* 44.3  MCV 95.8 94.5 90.7 91.7  PLT 134.0 Repeated and verified X2.* 64* 89* 100*   Basic Metabolic Panel: Recent Labs  Lab 02/01/23 0306 02/01/23 2102 02/01/23 2315 02/02/23 0040 02/02/23 1034 02/03/23 0925  NA 137  --  134* 135 132* 133*  K 4.4  --  4.2 4.5 4.5 4.8  CL 91*  --  91* 92* 89* 90*  CO2 22  --  26 28 24  21*  GLUCOSE 107*  --  138* 100* 201* 135*  BUN 49*  --  29* 31* 35* 49*  CREATININE 6.34*  --  4.59* 4.76* 5.19* 6.58*  CALCIUM 9.5  --  9.2 8.9 9.2 8.9  MG 2.0  --   --   --   --   --   PHOS  --  2.6  --  3.7  --   --    GFR: Estimated Creatinine Clearance: 9.1 mL/min (A) (by C-G formula based on SCr of 6.58 mg/dL (H)). Liver Function Tests: Recent Labs  Lab 02/01/23 0306 02/02/23 0040  AST 31  --   ALT 23  --   ALKPHOS 104  --   BILITOT 0.5  --   PROT 8.0  --   ALBUMIN 3.3* 2.9*  Recent Labs  Lab 02/01/23 0306  LIPASE 92*   Recent Labs  Lab 02/01/23 0343  AMMONIA 120*   Coagulation Profile: Recent Labs  Lab 02/01/23 0306  INR 1.4*   BNP (last 3 results) Recent Labs    04/22/22 1114  PROBNP 3,685.0*  HbA1C: No results for input(s): "HGBA1C" in the last 72 hours. CBG: Recent Labs  Lab 02/02/23 1034 02/02/23 1037 02/02/23 1658 02/03/23 1045 02/03/23 1048  GLUCAP 142* 96 72 <10* 152*   Antimicrobials: Anti-infectives (From admission, onward)    Start      Dose/Rate Route Frequency Ordered Stop   02/01/23 1200  doxycycline (VIBRA-TABS) tablet 100 mg        100 mg Oral 2 times daily 02/01/23 0952        Culture/Microbiology    Component Value Date/Time   SDES BLOOD LEFT ARM 09/03/2019 0919   SDES BLOOD LEFT HAND 09/03/2019 0919   SPECREQUEST  09/03/2019 0919    BOTTLES DRAWN AEROBIC AND ANAEROBIC Blood Culture adequate volume   SPECREQUEST  09/03/2019 0919    BOTTLES DRAWN AEROBIC AND ANAEROBIC Blood Culture adequate volume   CULT  09/03/2019 0919    NO GROWTH 5 DAYS Performed at [facility], [address] [place] [place], [place] [address]    CULT  09/03/2019 0919    NO GROWTH 5 DAYS Performed at [facility], [address] [place], [place] [address]    REPTSTATUS 09/08/2019 FINAL 09/03/2019 0919   REPTSTATUS 09/08/2019 FINAL 09/03/2019 0919  Radiology Studies: US Abdomen Complete  Result Date: 02/01/2023 CLINICAL DATA:  Pancreatitis, elevated lipase. EXAM: ABDOMEN ULTRASOUND COMPLETE COMPARISON:  Renal ultrasound dated 08/17/2019 and CT abdomen and pelvis dated 01/21/2023. FINDINGS: Gallbladder: No gallstones visualized. There is questionable gallbladder wall thickening up to 5 mm. No sonographic Murphy sign noted by sonographer. Common bile duct: Diameter: 3 mm Liver: No focal lesion identified. Within normal limits in parenchymal echogenicity. Portal vein is patent on color Doppler imaging with normal direction of blood flow towards the liver. IVC: Not well seen. Pancreas: Not well seen. Spleen: Size and appearance within normal limits. Right Kidney: Length: 8.2 cm. Echogenicity is increased and the kidney appears atrophic. No mass or hydronephrosis visualized. Left Kidney: Not visualized. Abdominal aorta: No aneurysm visualized. Other findings: None. IMPRESSION: 1. Questionable gallbladder wall thickening. No gallstones visualized. 2. Atrophic right kidney. Electronically Signed   By: Romona Curls M.D.   On: 02/01/2023  12:14     LOS: 2 days   Lanae Boast, MD [facility]  02/03/2023, 11:15 AM

## 2023-02-03 NOTE — Progress Notes (Signed)
Physical Therapy Treatment Patient Details Name: Tyler MICKE MRN: [id number] DOB: 23-Jan-1941 Today's Date: 02/03/2023   History of Present Illness Pt is an 82 y.o. male admitted 02/01/23 with fatigue, decreased responsiveness and unable to walk or get out of bed. Of note, pt recently admitted 01/21/23 with lactic acidosis, hypoglycemia. PMH includes ESRD on HD, CHF, AICD, HLD, gout, GERD, dementia.    PT Comments  Pt admitted with above diagnosis. Pt was able to ambulate with min assist and cues with need for chair follow and using RW. Has support at home at all times.  Will continue to follow acutely.  Pt currently with functional limitations due to the deficits listed below (see PT Problem List). Pt will benefit from acute skilled PT to increase their independence and safety with mobility to allow discharge.       If plan is discharge home, recommend the following: A lot of help with walking and/or transfers;A lot of help with bathing/dressing/bathroom;Assistance with cooking/housework;Direct supervision/assist for medications management;Assist for transportation;Supervision due to cognitive status   Can travel by private vehicle        Equipment Recommendations  Rolling walker (2 wheels)    Recommendations for Other Services OT consult     Precautions / Restrictions Precautions Precautions: Fall Restrictions Weight Bearing Restrictions: No     Mobility  Bed Mobility               General bed mobility comments: Pt in chair on arrival    Transfers Overall transfer level: Needs assistance Equipment used: Rolling walker (2 wheels) Transfers: Sit to/from Stand, Bed to chair/wheelchair/BSC Sit to Stand: Min assist, +2 safety/equipment           General transfer comment: Min assist to stand from chair; tends to have a posterior lean, with feet anterior to center of mass; cues to step for better foot placement to correct; Once up, balance improves     Ambulation/Gait Ambulation/Gait assistance: Contact guard assist, +2 safety/equipment Gait Distance (Feet): 90 Feet Assistive device: Rolling walker (2 wheels) Gait Pattern/deviations: Step-through pattern, Decreased stride length, Trunk flexed, Drifts right/left Gait velocity: Decreased Gait velocity interpretation: 1.31 - 2.62 ft/sec, indicative of limited community ambulator   General Gait Details: slow, mostly steady gait with RW and CGA for balance with pt needing cues for safety with RW, to stay close to RW.   Stairs             Wheelchair Mobility     Tilt Bed    Modified Rankin (Stroke Patients Only)       Balance Overall balance assessment: Needs assistance Sitting-balance support: No upper extremity supported, Feet supported Sitting balance-Leahy Scale: Fair     Standing balance support: Bilateral upper extremity supported, During functional activity, Reliant on assistive device for balance Standing balance-Leahy Scale: Poor Standing balance comment: Persistent posterior lean initially, relies on RW for balance and +2 for safety with dynamic movement                            Cognition Arousal: Alert Behavior During Therapy: WFL for tasks assessed/performed Overall Cognitive Status: History of cognitive impairments - at baseline                                 General Comments: h/o dementia, requires increased cues to follow commands.  Exercises      General Comments        Pertinent Vitals/Pain Pain Assessment Pain Assessment: No/denies pain    Home Living Family/patient expects to be discharged to:: Private residence Living Arrangements: Children Available Help at Discharge: Family;Personal care attendant;Available 24 hours/day Type of Home: Apartment Home Access: Ramped entrance       Home Layout: One level Home Equipment: Agricultural consultant (2 wheels);Cane - single point;Tub bench;Grab bars -  tub/shower;Electric scooter Additional Comments: Per PT eval: caregiver, at home they tend to use handheld assist, and using a RW tends to "get in the way";    Prior Function            PT Goals (current goals can now be found in the care plan section) Acute Rehab PT Goals Patient Stated Goal: Did not specifically state Progress towards PT goals: Progressing toward goals    Frequency    Min 1X/week      PT Plan      Co-evaluation              AM-PAC PT "6 Clicks" Mobility   Outcome Measure  Help needed turning from your back to your side while in a flat bed without using bedrails?: A Little Help needed moving from lying on your back to sitting on the side of a flat bed without using bedrails?: A Little Help needed moving to and from a bed to a chair (including a wheelchair)?: A Lot Help needed standing up from a chair using your arms (e.g., wheelchair or bedside chair)?: A Lot Help needed to walk in hospital room?: A Little Help needed climbing 3-5 steps with a railing? : Total 6 Click Score: 14    End of Session Equipment Utilized During Treatment: Gait belt Activity Tolerance: Patient limited by fatigue Patient left: in chair;with call bell/phone within reach;with chair alarm set;with family/visitor present Nurse Communication: Mobility status PT Visit Diagnosis: Other abnormalities of gait and mobility (R26.89);Muscle weakness (generalized) (M62.81)     Time: 2130-8657 PT Time Calculation (min) (ACUTE ONLY): 16 min  Charges:    $Gait Training: 8-22 mins PT General Charges $$ ACUTE PT VISIT: 1 Visit                     Verdine Grenfell M,PT Acute Rehab Services [phone]    Bevelyn Buckles 02/03/2023, 1:55 PM

## 2023-02-03 NOTE — Evaluation (Signed)
Occupational Therapy Evaluation Patient Details Name: Tyler Hahn MRN: [id number] DOB: Aug 21, 1940 Today's Date: 02/03/2023   History of Present Illness Pt is an 82 y.o. male admitted 02/01/23 with fatigue, decreased responsiveness and unable to walk or get out of bed. Of note, pt recently admitted 01/21/23 with lactic acidosis, hypoglycemia. PMH includes ESRD on HD, CHF, AICD, HLD, gout, GERD, dementia.   Clinical Impression   Patient admitted for the diagnosis above.  PTA he lives at home with his son, and has 24 hour PCA assist at home.  Patient has cognitive deficits at baseline, but is following commands this session.  Patient is needing up to Mod A for bed mobility and ADL completion.  Given the level of support, HH OT may not be needed, but will leave that up to family.  OT wil continue to follow him in the acute setting to address deficits noted.         If plan is discharge home, recommend the following: A little help with walking and/or transfers;A lot of help with bathing/dressing/bathroom;Assistance with cooking/housework;Direct supervision/assist for medications management;Direct supervision/assist for financial management;Assist for transportation;Help with stairs or ramp for entrance    Functional Status Assessment  Patient has had a recent decline in their functional status and demonstrates the ability to make significant improvements in function in a reasonable and predictable amount of time.  Equipment Recommendations  None recommended by OT    Recommendations for Other Services       Precautions / Restrictions Precautions Precautions: Fall Restrictions Weight Bearing Restrictions: No      Mobility Bed Mobility Overal bed mobility: Needs Assistance Bed Mobility: Supine to Sit     Supine to sit: Mod assist          Transfers Overall transfer level: Needs assistance Equipment used: Rolling walker (2 wheels) Transfers: Sit to/from Stand, Bed to  chair/wheelchair/BSC Sit to Stand: Mod assist     Step pivot transfers: Min assist            Balance Overall balance assessment: Needs assistance Sitting-balance support: No upper extremity supported, Feet supported Sitting balance-Leahy Scale: Fair       Standing balance-Leahy Scale: Poor Standing balance comment: Persistent posterior lean initially                           ADL either performed or assessed with clinical judgement   ADL       Grooming: Wash/dry hands;Wash/dry face;Sitting;Set up   Upper Body Bathing: Minimal assistance;Sitting   Lower Body Bathing: Moderate assistance;Sit to/from stand   Upper Body Dressing : Minimal assistance;Sitting   Lower Body Dressing: Moderate assistance;Sitting/lateral leans   Toilet Transfer: Ambulation;Rolling walker (2 wheels);Minimal assistance                   Vision Baseline Vision/History: 1 Wears glasses Patient Visual Report: No change from baseline       Perception Perception: Not tested       Praxis Praxis: Not tested       Pertinent Vitals/Pain Pain Assessment Pain Assessment: Faces Faces Pain Scale: Hurts a little bit Pain Location: L hand Pain Descriptors / Indicators: Aching Pain Intervention(s): Monitored during session     Extremity/Trunk Assessment Upper Extremity Assessment Upper Extremity Assessment: Generalized weakness RUE Deficits / Details: arthritic/joint changes in hand RUE Coordination: decreased fine motor LUE Deficits / Details: arthritic/joint changes in hand LUE Coordination: decreased fine motor   Lower  Extremity Assessment Lower Extremity Assessment: Defer to PT evaluation   Cervical / Trunk Assessment Cervical / Trunk Assessment: Kyphotic   Communication Communication Communication: No apparent difficulties Cueing Techniques: Verbal cues;Tactile cues   Cognition Arousal: Alert Behavior During Therapy: WFL for tasks assessed/performed Overall  Cognitive Status: History of cognitive impairments - at baseline                                                        Home Living Family/patient expects to be discharged to:: Private residence Living Arrangements: Children Available Help at Discharge: Family;Personal care attendant;Available 24 hours/day Type of Home: Apartment Home Access: Ramped entrance     Home Layout: One level     Bathroom Shower/Tub: Chief Strategy Officer: Standard Bathroom Accessibility: Yes How Accessible: Accessible via walker Home Equipment: Rolling Walker (2 wheels);Cane - single point;Tub bench;Grab bars - tub/shower;Electric scooter   Additional Comments: Per PT eval: caregiver, at home they tend to use handheld assist, and using a RW tends to "get in the way";      Prior Functioning/Environment Prior Level of Function : Needs assist             Mobility Comments: ambulatory with intermittent use of RW vs SPC; family helps pt stay as active as possible, has not needed w/c for community ambulation ADLs Comments: sits to shower, assisted for bathing, can typically dress himself, assist for IADLs; has not been in the shower since early August 2024        OT Problem List: Decreased strength;Decreased activity tolerance;Impaired balance (sitting and/or standing);Decreased cognition;Impaired UE functional use      OT Treatment/Interventions: Self-care/ADL training;DME and/or AE instruction;Therapeutic activities;Patient/family education;Balance training    OT Goals(Current goals can be found in the care plan section) Acute Rehab OT Goals Patient Stated Goal: Return home OT Goal Formulation: With patient/family Time For Goal Achievement: 02/17/23 Potential to Achieve Goals: Good ADL Goals Pt Will Perform Grooming: with contact guard assist;standing Pt Will Perform Upper Body Bathing: with set-up;sitting Pt Will Perform Upper Body Dressing: with  set-up;sitting Pt Will Transfer to Toilet: with contact guard assist;ambulating;regular height toilet  OT Frequency: Min 1X/week    Co-evaluation              AM-PAC OT "6 Clicks" Daily Activity     Outcome Measure Help from another person eating meals?: A Little Help from another person taking care of personal grooming?: A Little Help from another person toileting, which includes using toliet, bedpan, or urinal?: A Lot Help from another person bathing (including washing, rinsing, drying)?: A Lot Help from another person to put on and taking off regular upper body clothing?: A Little Help from another person to put on and taking off regular lower body clothing?: A Lot 6 Click Score: 15   End of Session Equipment Utilized During Treatment: Rolling walker (2 wheels) Nurse Communication: Mobility status  Activity Tolerance: Patient tolerated treatment well Patient left: in chair;with call bell/phone within reach;with chair alarm set  OT Visit Diagnosis: Unsteadiness on feet (R26.81);Other abnormalities of gait and mobility (R26.89);Muscle weakness (generalized) (M62.81);Other symptoms and signs involving cognitive function                Time: 5621-3086 OT Time Calculation (min): 21 min Charges:  OT General Charges $OT  Visit: 1 Visit OT Evaluation $OT Eval Moderate Complexity: 1 Mod  02/03/2023  RP, OTR/L  Acute Rehabilitation Services  Office:  [phone]   Suzanna Obey 02/03/2023, 10:28 AM

## 2023-02-03 NOTE — Telephone Encounter (Signed)
Informed the pt son with lab results

## 2023-02-03 NOTE — Hospital Course (Addendum)
81 yom w/ HTN,HLD, systolic CHF s/p AICD, chronic hypotension, hypothyroidism, diet-controlled diabetes mellitus type 2, anxiety, and gout  presented after being noted to be acutely altered During admission more alert, but only oriented to self been which appears to be his baseline>additional history is obtained from his caregiver over the phone. Recent hospitalization 8/6-8/9 >fluid overload in the setting of ESRD on HD, got HD x2, had intermittent confusion that would resolve on its own.After getting home  the following week was noted to have moment of confusion where he could not remember how to walk>next day he was back to his normal self. Around 11:30 PM 8/16 patient was noted to be lethargic, unable to move his feet with complaints of nausea, and reported not to be himself for which she called EMS. He had been on antibiotics for a pressure ulcer of his second right toe. In ED: NW:[id number] with respirations 12-24,BP 86/55-116/64, and O2 saturations noted to be as low as 74% with improvement on 2 L of [place]. Labs significant for platelets 64, potassium 4.4, CO2 22, BUN 49, creatinine 6.34, anion gap 24, lipase 92, ammonia 120, INR 1.4, BNP greater than 4500 CXR>diffuse increased interstitial markings thought to be chronic in nature with possible superimposed interstitial edema. CT scan of the head noted generalized cerebral atrophy with chronic white matter small vessel ischemic changes, but otherwise no acute abnormality.  Patient was given lactulose 30 g p.o. He underwent workup for confusion and difficulty walking. Workup so far unrevealing except for elevated ammonia level CT head negative nonfocal and actually he is now back to baseline alert awake with pleasant confusion with baseline dementia.  Did well with PT OT and recommending home health PT OT.  Seen by palliative care as well.  Continue DNR.  In the process of changing his outpatient dialysis unit but will take some time he may need to continue 1  or 2 more sessions in his current dialysis unit. Plan of care discussed with patient's son agreeable for discharge home.

## 2023-02-03 NOTE — Telephone Encounter (Signed)
-----   Message from Neena Rhymes sent at 02/03/2023  8:23 AM EDT ----- Labs are stable.  I know he is back in the hospital and I am following along.

## 2023-02-03 NOTE — Progress Notes (Addendum)
Pt receives out-pt HD at [facility] on TTS. Will assist as needed.   Olivia Canter Renal Navigator [phone]  Addendum at 3:57 pm: Received information that family is interested in pt being changed to a different HD clinic. Contacted [facility] and spoke to staff regarding pt's case. NW to contact another local clinic to see if it would be possible for pt to transfer from current clinic to another clinic (since this was discussed out-pt prior to pt's admission). Will provide update to team as any updates are received.

## 2023-02-03 NOTE — Patient Outreach (Signed)
  Care Coordination   Documentation   Visit Note   02/03/2023 Name: KALIEB RICKETSON MRN: [id number] DOB: 22-May-1941  SANJIV PETIT is a 82 y.o. year old male who sees Tabori, Helane Rima, MD for primary care. I  noted patient to be hospitalized for altered mental status.  RN CM will follow hospitalization.  Patient will receive TOC call and then be scheduled with RN CM after.    What matters to the patients health and wellness today?  N/A    Goals Addressed   None     SDOH assessments and interventions completed:  No     Care Coordination Interventions:  No, not indicated   Follow up plan:  Patient will be schedule after TOC assessment call.      Encounter Outcome:  Pt. Visit Completed   Bary Leriche, RN, MSN [facility] Care Management Care Management Coordinator Direct Line [phone]

## 2023-02-03 NOTE — Telephone Encounter (Signed)
Forms completed and returned to [facility] 

## 2023-02-03 NOTE — Plan of Care (Signed)

## 2023-02-04 ENCOUNTER — Ambulatory Visit: Payer: Medicare HMO | Admitting: Podiatry

## 2023-02-04 DIAGNOSIS — K7682 Hepatic encephalopathy: Secondary | ICD-10-CM | POA: Diagnosis not present

## 2023-02-04 DIAGNOSIS — Z7189 Other specified counseling: Secondary | ICD-10-CM | POA: Diagnosis not present

## 2023-02-04 DIAGNOSIS — Z515 Encounter for palliative care: Secondary | ICD-10-CM | POA: Diagnosis not present

## 2023-02-04 LAB — AMMONIA: Ammonia: 39 umol/L — ABNORMAL HIGH (ref 9–35)

## 2023-02-04 LAB — HEPATITIS B SURFACE ANTIGEN: Hepatitis B Surface Ag: NONREACTIVE

## 2023-02-04 LAB — BASIC METABOLIC PANEL
Anion gap: 19 — ABNORMAL HIGH (ref 5–15)
BUN: 71 mg/dL — ABNORMAL HIGH (ref 8–23)
CO2: 20 mmol/L — ABNORMAL LOW (ref 22–32)
Calcium: 8.8 mg/dL — ABNORMAL LOW (ref 8.9–10.3)
Chloride: 92 mmol/L — ABNORMAL LOW (ref 98–111)
Creatinine, Ser: 7.59 mg/dL — ABNORMAL HIGH (ref 0.61–1.24)
GFR, Estimated: 7 mL/min — ABNORMAL LOW (ref >60)
Glucose, Bld: 166 mg/dL — ABNORMAL HIGH (ref 70–99)
Potassium: 4.9 mmol/L (ref 3.5–5.1)
Sodium: 131 mmol/L — ABNORMAL LOW (ref 135–145)

## 2023-02-04 LAB — CBC
HCT: 36.5 % — ABNORMAL LOW (ref 39.0–52.0)
Hemoglobin: 12.5 g/dL — ABNORMAL LOW (ref 13.0–17.0)
MCH: 30.6 pg (ref 26.0–34.0)
MCHC: 34.2 g/dL (ref 30.0–36.0)
MCV: 89.5 fL (ref 80.0–100.0)
Platelets: 109 10*3/uL — ABNORMAL LOW (ref 150–400)
RBC: 4.08 MIL/uL — ABNORMAL LOW (ref 4.22–5.81)
RDW: 19 % — ABNORMAL HIGH (ref 11.5–15.5)
WBC: 8.8 10*3/uL (ref 4.0–10.5)
nRBC: 0.6 % — ABNORMAL HIGH (ref 0.0–0.2)

## 2023-02-04 LAB — INSULIN-LIKE GROWTH FACTOR: Somatomedin C: 98 ng/mL (ref 45–207)

## 2023-02-04 MED ORDER — HEPARIN SODIUM (PORCINE) 1000 UNIT/ML IJ SOLN
INTRAMUSCULAR | Status: AC
  Administered 2023-02-04: 2000 [IU]
  Filled 2023-02-04: qty 2

## 2023-02-04 NOTE — Plan of Care (Signed)

## 2023-02-04 NOTE — Progress Notes (Signed)
[facility] has requested a transfer for pt to [facility]. Contacted [facility] to provide an update regarding pt being currently hospitalized. Pt's case is being reviewed by clinic staff and will await determination. Will assist as needed.   Olivia Canter Renal Navigator [phone]

## 2023-02-04 NOTE — Progress Notes (Signed)
   Palliative Medicine Inpatient Follow Up Note HPI: Pt is an 82 y.o. male admitted 02/01/23 with fatigue, decreased responsiveness and unable to walk or get out of bed. Of note, pt recently admitted 01/21/23 with lactic acidosis, hypoglycemia. PMH includes ESRD on HD, CHF, AICD, HLD, gout, GERD, dementia.    Palliative care has been asked to get involved to re-address goals of care.  Today's Discussion 02/04/2023  *Please note that this is a verbal dictation therefore any spelling or grammatical errors are due to the "Dragon Medical One" system interpretation.  Chart reviewed inclusive of vital signs, progress notes, laboratory results, and diagnostic images.   I met with Tyler Hahn at bedside this morning. He was noted to be resting in bed in no visible distress.   I called patients son, Tyler Hahn this morning. I reviewed the conversations held with his brother, Tyler Hahn and his sister. Discussed the issues occurring in the OP dialysis centers mentioned by family. Tyler Hahn shares he has started the process of requesting a transfer of dialysis centers.   We reviewed that [name] has not endured significant decline since he was last admitted cognitively of physically. He is noted to be weakened at [facility] and does receive PT/OT services.  We reviewed the options moving into the future. I shared the importance of Palliative follow up and involvement in addition to the newly initiated dementia support program by medicare.  Tyler Hahn is open to both.  Asserted that if at anytime [name] is experiencing more trouble or difficulty as a result of the interventions being pursued that Tyler Hahn nor his family would want for him to suffer. WE discussed the idea of hospice if that day were to come.   Questions and concerns addressed/Palliative Support Provided.   Objective Assessment: Vital Signs Vitals:   02/04/23 0800 02/04/23 0829  BP: 103/65 112/60  Pulse: 86 82  Resp: 17 16  Temp: 98.2 F (36.8 C)    SpO2:  93%    Intake/Output Summary (Last 24 hours) at 02/04/2023 0850 Last data filed at 02/04/2023 0745 Gross per 24 hour  Intake 200 ml  Output 2001 ml  Net -1801 ml   Last Weight  Most recent update: 02/04/2023  8:17 AM    Weight  76.4 kg (168 lb 6.9 oz)            Gen:  Elderly AA M in NAD HEENT: moist mucous membranes CV: Regular rate and rhythm  PULM:  Breathing comfortably on RA ABD: soft/nontender  EXT: No edema  Neuro: Resting quietly  SUMMARY OF RECOMMENDATIONS   DNAR/DNI   Patients family hopeful for new dialysis clinic   Recommend OP Palliative support in addition to referral to Dementia Care services   Ongoing support by the PMT  Billing based on MDM: High ______________________________________________________________________________________ [name]  Palliative Medicine Team Team Cell Phone: [phone] Please utilize secure chat with additional questions, if there is no response within 30 minutes please call the above phone number  Palliative Medicine Team providers are available by phone from 7am to 7pm daily and can be reached through the team cell phone.  Should this patient require assistance outside of these hours, please call the patient's attending physician.

## 2023-02-04 NOTE — Assessment & Plan Note (Signed)
Ongoing issue for pt.  His volume status is getting more difficult to manage b/c his blood pressure doesn't tolerate the removal of fluid required to keep lungs clear and swelling under control.  Had discussion w/ pt's care giver (present in office) and son (via phone) to discuss a Hospice referral.  Son is open to idea of Hospice for symptom management as long as he can continue outpt HD.  Unclear if HD is permitted while in Hospice so palliative care referral placed.  We can escalate care if needed.

## 2023-02-04 NOTE — Progress Notes (Signed)
Received patient in bed to unit.  Alert and oriented x1 Informed consent signed and in chart.   TX duration:3 hours  Patient tolerated well.  Transported back to the room  Alert, without acute distress.  Hand-off given to patient's nurse.   Access used: AVF Access issues: n/a Total UF removed: 3L  Post HD weight: 73.0KG   [facility]      02/04/23 1135  Vitals  Temp (!) 97.5 F (36.4 C)  Temp Source Oral  BP 115/62  Pulse Rate 88  Pulse Rate Source Monitor  Resp 14  Oxygen Therapy  SpO2 96 %  O2 Device Room Air  During Treatment Monitoring  Blood Flow Rate (mL/min) 400 mL/min  Arterial Pressure (mmHg) -244.03 mmHg  Venous Pressure (mmHg) 276.55 mmHg  TMP (mmHg) 19.19 mmHg  Ultrafiltration Rate (mL/min) 1251 mL/min  Dialysate Flow Rate (mL/min) 300 ml/min  Dialysate Potassium Concentration 2  Dialysate Calcium Concentration 2.5  Duration of HD Treatment -hour(s) 3 hour(s)  Cumulative Fluid Removed (mL) per Treatment  3000.14  HD Safety Checks Performed Yes  Intra-Hemodialysis Comments Tx completed;Tolerated well  Post Treatment  Dialyzer Clearance Lightly streaked  Hemodialysis Intake (mL) 0 mL  Liters Processed 72  Fluid Removed (mL) 3000 mL  Tolerated HD Treatment Yes  AVG/AVF Arterial Site Held (minutes) 5 minutes  AVG/AVF Venous Site Held (minutes) 5 minutes  Fistula / Graft Right Upper arm Arteriovenous vein graft  Placement Date/Time: 08/23/19 1015   Placed prior to admission: No  Orientation: Right  Access Location: Upper arm  Access Type: (c) Arteriovenous vein graft  Site Condition No complications  Fistula / Graft Assessment Thrill;Present;Bruit  Status Deaccessed

## 2023-02-04 NOTE — Assessment & Plan Note (Signed)
Ongoing issue.  Family is trying to switch his outpt facility.  At this time, they want to continue HD and are not ready to stop despite his ongoing multiple medical complications.  Will continue to follow.  Due to his ongoing leg pain and cramping, will check BMP.

## 2023-02-04 NOTE — Progress Notes (Signed)
[facility] Progress Note   Subjective:  Seen on HD again (today is his usual day) - did have extra HD yesterday with 2L off, trying for 3L today. No new complaints - denies CP or dyspnea. LE edema is somewhat improved.  Objective Vitals:   02/04/23 0253 02/04/23 0728 02/04/23 0800 02/04/23 0829  BP: (!) 101/56 105/61 103/65 (P) 112/60  Pulse: 86 86 86   Resp: 17 16 17    Temp: 97.8 F (36.6 C) 98 F (36.7 C) 98.2 F (36.8 C)   TempSrc: Oral Oral Oral   SpO2: 97% 95%    Weight: 77.1 kg  76.4 kg   Height:       Physical Exam General: Well appearing man, NAD Heart: RRR; 2/6 murmur Lungs: CTA laterally Abdomen: soft Extremities: 2+ BLE edema to knees (improved from yesterday) Dialysis Access: RUE AVG + bruit  Additional Objective Labs: Basic Metabolic Panel: Recent Labs  Lab 02/01/23 2102 02/01/23 2315 02/02/23 0040 02/02/23 1034 02/03/23 0925  NA  --    < > 135 132* 133*  K  --    < > 4.5 4.5 4.8  CL  --    < > 92* 89* 90*  CO2  --    < > 28 24 21*  GLUCOSE  --    < > 100* 201* 135*  BUN  --    < > 31* 35* 49*  CREATININE  --    < > 4.76* 5.19* 6.58*  CALCIUM  --    < > 8.9 9.2 8.9  PHOS 2.6  --  3.7  --   --    < > = values in this interval not displayed.   Liver Function Tests: Recent Labs  Lab 02/01/23 0306 02/02/23 0040  AST 31  --   ALT 23  --   ALKPHOS 104  --   BILITOT 0.5  --   PROT 8.0  --   ALBUMIN 3.3* 2.9*   Recent Labs  Lab 02/01/23 0306  LIPASE 92*   CBC: Recent Labs  Lab 01/31/23 1143 02/01/23 0343 02/02/23 0040 02/03/23 0925  WBC 8.3 6.1 5.3 8.8  NEUTROABS 6.3  --   --   --   HGB 13.1 13.5 11.6* 14.4  HCT 41.7 40.9 35.1* 44.3  MCV 95.8 94.5 90.7 91.7  PLT 134.0 Repeated and verified X2.* 64* 89* 100*   CBG: Recent Labs  Lab 02/02/23 1037 02/02/23 1658 02/03/23 1045 02/03/23 1048 02/03/23 1901  GLUCAP 96 72 <10* 152* 168*   Medications:   (feeding supplement) PROSource Plus  30 mL Oral BID BM    allopurinol  100 mg Oral Once per day on Monday Wednesday Friday   atorvastatin  20 mg Oral Daily   calcitRIOL  1.5 mcg Oral Q T,Th,Sat-1800   Chlorhexidine Gluconate Cloth  6 each Topical Q0600   cinacalcet  60 mg Oral Q T,Th,Sat-1800   donepezil  10 mg Oral QHS   doxycycline  100 mg Oral BID   lactulose  30 g Oral BID   levothyroxine  75 mcg Oral Q0600   midodrine  5 mg Oral TID WC   pantoprazole  40 mg Oral Daily   sertraline  150 mg Oral Daily   sevelamer carbonate  800 mg Oral TID WC   sodium chloride flush  3 mL Intravenous Q12H    Dialysis Orders: TTS at [facility] 3.5hr, 400/A1.5, EDW 79.5kg, 2K/2.5Ca bath, AVG, heparin 4000 unit bolus - Not reaching  EDW prior to admit - typically 2-6kg up for past 3 weeks - Calcitriol 1.23mcg PO q HD - Sensipar 60mg  PO q HD - No ESA   Assessment/Plan: 1. Acute Hypoxic Resp Failure: Pulm edema/LE edema on exam, no respiratory distress. S/p extra HD 8/19. 2. ESRD: Usual TTS schedule - extra HD yesterday - now back on usual schedule - HD now. 3. HypoTN/volume: Chronically low BP on mido 5mg  TID, UF as tolerated. May need to ^ mido to 10mg  4. Anemia of ESRD: Hgb > 12, no ESA needed. Abd [place] today without renal masses. 5. Secondary hyperparathyroidism:  CorrCa high side, Phos good. Continue Sevelamer as binder and watch Ca for now - may need to reduce VDRA 6. Nutrition: Alb low, continue supplements. 7. Dementia 8. T2DM 9. DNR 10. Hepatic encephalopathy: Ammonia 120 on admit, improved with lactulose. 11.  Dispo: There was question of changing HD units, this can be done as outpatient. His unit is aware and has started the process. Ok to dispo from renal standpoint after HD today, once cleared medically.  Ozzie Hoyle, PA-C 02/04/2023, 8:30 AM  [employer]

## 2023-02-04 NOTE — Procedures (Signed)
HD Note:  Some information was entered later than the data was gathered due to patient care needs. The stated time with the data is accurate.  Received patient in bed to unit.   Alert and oriented to person and situation  Informed consent signed and in chart.   TX duration: 3 hours, original order of 3.5 changed by Ozzie Hoyle, PA  Patient tolerated treatment well.   Transported back to the room   Alert, without acute distress.   Access used: Upper right arm fistula Access issues: None  Total UF removed: 3000 ml  Hand-off given to patient's nurse.   Kerry Chisolm L. Dareen Piano, RN Kidney Dialysis Unit.

## 2023-02-04 NOTE — Progress Notes (Signed)
PROGRESS NOTE Tyler Hahn  LKG:[id number] DOB: 08/17/1940 DOA: 02/01/2023 PCP: Sheliah Hatch, MD  Brief Narrative/Hospital Course: 48 yom w/ HTN,HLD, systolic CHF s/p AICD, chronic hypotension, hypothyroidism, diet-controlled diabetes mellitus type 2, anxiety, and gout  presented after being noted to be acutely altered During admission more alert, but only oriented to self been which appears to be his baseline>additional history is obtained from his caregiver over the phone. Recent hospitalization 8/6-8/9 >fluid overload in the setting of ESRD on HD, got HD x2, had intermittent confusion that would resolve on its own.After getting home  the following week was noted to have moment of confusion where he could not remember how to walk>next day he was back to his normal self. Around 11:30 PM 8/16 patient was noted to be lethargic, unable to move his feet with complaints of nausea, and reported not to be himself for which she called EMS. He had been on antibiotics for a pressure ulcer of his second right toe.  In ED: [id number] with respirations 12-24,BP 86/55-116/64, and O2 saturations noted to be as low as 74% with improvement on 2 L of [place]. Labs significant for platelets 64, potassium 4.4, CO2 22, BUN 49, creatinine 6.34, anion gap 24, lipase 92, ammonia 120, INR 1.4, BNP greater than 4500 CXR>diffuse increased interstitial markings thought to be chronic in nature with possible superimposed interstitial edema. CT scan of the head noted generalized cerebral atrophy with chronic white matter small vessel ischemic changes, but otherwise no acute abnormality.  Patient was given lactulose 30 g p.o. He underwent workup for confusion and unable to walk/limp.    Subjective: Seen and examined this morning alert awake oriented to self pleasantly confused at baseline No complaints.  He is getting his dialysis.   Assessment and Plan: Principal Problem:   Acute hepatic encephalopathy (HCC) Active  Problems:   ESRD on dialysis (HCC)   Acute respiratory failure with hypoxia (HCC)   Chronic hypotension   SYSTOLIC HEART FAILURE, CHRONIC   Pressure ulcer of toe   Type 2 diabetes mellitus with hyperlipidemia (HCC)   Hypothyroid   Dementia (HCC)   Thrombocytopenia (HCC)   Acute encephalopathy History of dementia: Patient having intermittent confusion acute on chronic with dementia, likely multifactorial in the setting of hypoxia fluid overload, and hyperammonemia-Workup so far with CT head negative ammonia level 120>given lactulose, continue on HD. Appears nonfocal, pleasantly confused with baseline dementia but much more alert awake Working with PT OT.Ultrasound abdomen 8/17 questionable gallbladder wall thickening no gallstone, atrophic right kidney no focal lesion of the liver.  He is back to baseline.  mRNA-LNP to 39.  Avoid constipation  Pseudo Hypoglycemia Type 2 diabetes mellitus: A1c 7.1 on 8/6, not on any meds at home.  Having hypoglycemia on cbg check on finger stick while blood glucose check on lab at the same time shows normal or high glucose.  Discontinued further CBG check from fingerstick can use ear lobe or BMP.  Remains stable. Recent Labs  Lab 02/02/23 1037 02/02/23 1658 02/03/23 1045 02/03/23 1048 02/03/23 1901  GLUCAP 96 72 <10* 152* 168*    Acute respiratory failure with hypoxia with fluid overload ESRD on HD TTS Chronic systolic CHF: Fluid being managed by nephrology with HD, on HD 8/20.EF 25 to 30% on echo 8/7 with severely dilated left ventricular internal cavity and diastolic function not on GDMT in the setting of hypotension and ESRD. Cont to monitor daily I/O,weight, electrolytes and net balance as below.Keep on  salt/fluid restricted  diet and monitor in tele. In the process of transferring his HD to new center. Net IO Since Admission: -4,681 mL [02/04/23 1301]  Filed Weights   02/04/23 0253 02/04/23 0800 02/04/23 1136  Weight: 77.1 kg 76.4 kg 73 kg     Recent Labs  Lab 02/01/23 0306 02/01/23 0343 02/01/23 2315 02/02/23 0040 02/02/23 1034 02/03/23 0925 02/04/23 0823  BNP  --  >4,500.0*  --   --   --   --   --   BUN 49*  --  29* 31* 35* 49* 71*  CREATININE 6.34*  --  4.59* 4.76* 5.19* 6.58* 7.59*  K 4.4  --  4.2 4.5 4.5 4.8 4.9  MG 2.0  --   --   --   --   --   --     Anemia of ESRD: Monitor hemoglobin transfuse for less than 7 g. Recent Labs  Lab 01/31/23 1143 02/01/23 0343 02/02/23 0040 02/03/23 0925 02/04/23 0829  HGB 13.1 13.5 11.6* 14.4 12.5*  HCT 41.7 40.9 35.1* 44.3 36.5*     Metabolic bone disease of CKD-calcium/Phos advised on Sensipar and p.o. vdra.  Chronic hypotension: cont midodrine   Pressure ulcer of second rt toe Caregiver feels to wound appears better.  Continues on doxycycline, offloading shoe and follow-up with podiatry.  ESR and CRP are relatively normal/slightly up crp  Thrombocytopenia,chronic: Acute on chronic.monitor platelet counts. Recent Labs  Lab 01/31/23 1143 02/01/23 0343 02/02/23 0040 02/03/23 0925 02/04/23 0829  PLT 134.0 Repeated and verified X2.* 64* 89* 100* 109*    Hypothyroidism: Continue Synthroid-dose was increased to 75 mcg.  TSH 9.6 will need recheck fast outpatient  Hyperlipidemia: Cont statins  GERD: Continue PPI  Goals of care: Continue supportive care DNR.  Per family request palliative care has been consulted and has been on board.  Input appreciated  DVT prophylaxis: SCDs Start: 02/01/23 0951 Code Status:   Code Status: DNR Family Communication: plan of care discussed with patient/caregiver at bedside. Patient status is: Patient because of encephalopathy Level of care: Telemetry Medical   Dispo: The patient is from: home w/ caregiver            Anticipated disposition: Home with home health once outpatient dialysis set up   Objective: Vitals last 24 hrs: Vitals:   02/04/23 1135 02/04/23 1136 02/04/23 1137 02/04/23 1214  BP: 115/62  115/62 117/72   Pulse: 88   92  Resp: 14  13 17   Temp: (!) 97.5 F (36.4 C)   97.7 F (36.5 C)  TempSrc: Oral   Oral  SpO2: 96%  93% 98%  Weight:  73 kg    Height:       Weight change: -0.3 kg  Physical Examination: General exam: alert awake, oriented x1. HEENT:Oral mucosa moist, Ear/Nose WNL grossly. Respiratory system:Bilaterally clear BS,no use of accessory muscle. Cardiovascular system:S1 & S2 +,No JVD. Gastrointestinal system: Abdomen soft,NT,ND,BS+. Nervous System: Alert, awake, moving all his extremities Extremities: LE edema neg,distal peripheral pulses palpable and warm.  Skin: No rashes,no icterus. MSK: Normal muscle bulk,tone, power.   Medications reviewed:  Scheduled Meds:  (feeding supplement) PROSource Plus  30 mL Oral BID BM   allopurinol  100 mg Oral Once per day on Monday Wednesday Friday   atorvastatin  20 mg Oral Daily   calcitRIOL  1.5 mcg Oral Q T,Th,Sat-1800   Chlorhexidine Gluconate Cloth  6 each Topical Q0600   cinacalcet  60 mg Oral Q T,Th,Sat-1800   donepezil  10  mg Oral QHS   doxycycline  100 mg Oral BID   lactulose  30 g Oral BID   levothyroxine  75 mcg Oral Q0600   midodrine  5 mg Oral TID WC   pantoprazole  40 mg Oral Daily   sertraline  150 mg Oral Daily   sevelamer carbonate  800 mg Oral TID WC   sodium chloride flush  3 mL Intravenous Q12H  Continuous Infusions:   Diet Order             Diet Heart Room service appropriate? Yes; Fluid consistency: Thin  Diet effective now                   Intake/Output Summary (Last 24 hours) at 02/04/2023 1301 Last data filed at 02/04/2023 1223 Gross per 24 hour  Intake 120 ml  Output 5001 ml  Net -4881 ml   Net IO Since Admission: -4,681 mL [02/04/23 1301]  Wt Readings from Last 3 Encounters:  02/04/23 73 kg  01/31/23 82.2 kg  01/25/23 79.4 kg     Unresulted Labs (From admission, onward)     Start     Ordered   02/03/23 1031  Hepatitis B surface antibody,quantitative  (New Admission Hemo Labs  (Hepatitis B))  Once,   R       Question:  Specimen collection method  Answer:  Lab=Lab collect   02/03/23 1033   02/02/23 0008  MRSA Next Gen by PCR, Nasal  Once,   R        02/02/23 0007          Data Reviewed: I have personally reviewed following labs and imaging studies CBC: Recent Labs  Lab 01/31/23 1143 02/01/23 0343 02/02/23 0040 02/03/23 0925 02/04/23 0829  WBC 8.3 6.1 5.3 8.8 8.8  NEUTROABS 6.3  --   --   --   --   HGB 13.1 13.5 11.6* 14.4 12.5*  HCT 41.7 40.9 35.1* 44.3 36.5*  MCV 95.8 94.5 90.7 91.7 89.5  PLT 134.0 Repeated and verified X2.* 64* 89* 100* 109*   Basic Metabolic Panel: Recent Labs  Lab 02/01/23 0306 02/01/23 2102 02/01/23 2315 02/02/23 0040 02/02/23 1034 02/03/23 0925 02/04/23 0823  NA 137  --  134* 135 132* 133* 131*  K 4.4  --  4.2 4.5 4.5 4.8 4.9  CL 91*  --  91* 92* 89* 90* 92*  CO2 22  --  26 28 24  21* 20*  GLUCOSE 107*  --  138* 100* 201* 135* 166*  BUN 49*  --  29* 31* 35* 49* 71*  CREATININE 6.34*  --  4.59* 4.76* 5.19* 6.58* 7.59*  CALCIUM 9.5  --  9.2 8.9 9.2 8.9 8.8*  MG 2.0  --   --   --   --   --   --   PHOS  --  2.6  --  3.7  --   --   --    GFR: Estimated Creatinine Clearance: 7.3 mL/min (A) (by C-G formula based on SCr of 7.59 mg/dL (H)). Liver Function Tests: Recent Labs  Lab 02/01/23 0306 02/02/23 0040  AST 31  --   ALT 23  --   ALKPHOS 104  --   BILITOT 0.5  --   PROT 8.0  --   ALBUMIN 3.3* 2.9*   Recent Labs  Lab 02/01/23 0306  LIPASE 92*   Recent Labs  Lab 02/01/23 0343 02/04/23 0823  AMMONIA 120* 39*   Coagulation Profile:  Recent Labs  Lab 02/01/23 0306  INR 1.4*   BNP (last 3 results) Recent Labs    04/22/22 1114  PROBNP 3,685.0*  HbA1C: No results for input(s): "HGBA1C" in the last 72 hours. CBG: Recent Labs  Lab 02/02/23 1037 02/02/23 1658 02/03/23 1045 02/03/23 1048 02/03/23 1901  GLUCAP 96 72 <10* 152* 168*   Antimicrobials: Anti-infectives (From admission, onward)     Start     Dose/Rate Route Frequency Ordered Stop   02/01/23 1200  doxycycline (VIBRA-TABS) tablet 100 mg        100 mg Oral 2 times daily 02/01/23 0952        Culture/Microbiology    Component Value Date/Time   SDES BLOOD LEFT ARM 09/03/2019 0919   SDES BLOOD LEFT HAND 09/03/2019 0919   SPECREQUEST  09/03/2019 0919    BOTTLES DRAWN AEROBIC AND ANAEROBIC Blood Culture adequate volume   SPECREQUEST  09/03/2019 0919    BOTTLES DRAWN AEROBIC AND ANAEROBIC Blood Culture adequate volume   CULT  09/03/2019 0919    NO GROWTH 5 DAYS Performed at [facility], [address] [place] [place], [place] [address]    CULT  09/03/2019 0919    NO GROWTH 5 DAYS Performed at [facility], [address] [place] [place], [place] [address]    REPTSTATUS 09/08/2019 FINAL 09/03/2019 0919   REPTSTATUS 09/08/2019 FINAL 09/03/2019 0919  Radiology Studies: DG Foot Complete Left  Result Date: 02/03/2023 Please see detailed radiograph report in office note.  DG Foot Complete Right  Result Date: 02/03/2023 Please see detailed radiograph report in office note.    LOS: 3 days   Lanae Boast, MD [facility]  02/04/2023, 1:01 PM

## 2023-02-04 NOTE — Plan of Care (Signed)

## 2023-02-05 ENCOUNTER — Telehealth: Payer: Self-pay | Admitting: Family Medicine

## 2023-02-05 DIAGNOSIS — Z515 Encounter for palliative care: Secondary | ICD-10-CM | POA: Diagnosis not present

## 2023-02-05 DIAGNOSIS — K7682 Hepatic encephalopathy: Secondary | ICD-10-CM | POA: Diagnosis not present

## 2023-02-05 LAB — HEPATITIS B SURFACE ANTIBODY, QUANTITATIVE: Hep B S AB Quant (Post): 95.8 m[IU]/mL

## 2023-02-05 LAB — GLUCOSE, CAPILLARY
Glucose-Capillary: 216 mg/dL — ABNORMAL HIGH (ref 70–99)
Glucose-Capillary: 119 mg/dL — ABNORMAL HIGH (ref 70–99)

## 2023-02-05 MED ORDER — LACTULOSE 10 GM/15ML PO SOLN: 10.0000 g | Freq: Two times a day (BID) | ORAL | 0 refills | Status: DC | PRN

## 2023-02-05 NOTE — Progress Notes (Signed)
[facility] Progress Note   Subjective:  Seen in room - eating breakfast and feels ok. Denies CP or dyspnea. On room air. HD unit being changed by renal SW, but this should NOT hold up his discharge - discussed with patient that he may need to go back to his old unit until he is approved - he understands.  Objective Vitals:   02/04/23 2015 02/05/23 0026 02/05/23 0404 02/05/23 0726  BP: (!) 108/57 (!) 101/59 98/60 (!) 96/52  Pulse: (!) 35 84  78  Resp: 17 18 20 16   Temp: 98.3 F (36.8 C) 98.2 F (36.8 C) 97.9 F (36.6 C) 97.6 F (36.4 C)  TempSrc: Oral Oral Axillary Oral  SpO2: 95% 96% 95% 91%  Weight:   75.6 kg   Height:       Physical Exam General: Well appearing man, NAD. Room air. Heart: RRR; 2/6 murmur Lungs: CTA laterally Abdomen: soft Extremities: 2+ BLE edema to lower shins only (improving) Dialysis Access: RUE AVG + bruit  Additional Objective Labs: Basic Metabolic Panel: Recent Labs  Lab 02/01/23 2102 02/01/23 2315 02/02/23 0040 02/02/23 1034 02/03/23 0925 02/04/23 0823  NA  --    < > 135 132* 133* 131*  K  --    < > 4.5 4.5 4.8 4.9  CL  --    < > 92* 89* 90* 92*  CO2  --    < > 28 24 21* 20*  GLUCOSE  --    < > 100* 201* 135* 166*  BUN  --    < > 31* 35* 49* 71*  CREATININE  --    < > 4.76* 5.19* 6.58* 7.59*  CALCIUM  --    < > 8.9 9.2 8.9 8.8*  PHOS 2.6  --  3.7  --   --   --    < > = values in this interval not displayed.   Liver Function Tests: Recent Labs  Lab 02/01/23 0306 02/02/23 0040  AST 31  --   ALT 23  --   ALKPHOS 104  --   BILITOT 0.5  --   PROT 8.0  --   ALBUMIN 3.3* 2.9*   Recent Labs  Lab 02/01/23 0306  LIPASE 92*   CBC: Recent Labs  Lab 01/31/23 1143 02/01/23 0343 02/02/23 0040 02/03/23 0925 02/04/23 0829  WBC 8.3 6.1 5.3 8.8 8.8  NEUTROABS 6.3  --   --   --   --   HGB 13.1 13.5 11.6* 14.4 12.5*  HCT 41.7 40.9 35.1* 44.3 36.5*  MCV 95.8 94.5 90.7 91.7 89.5  PLT 134.0 Repeated and verified X2.*  64* 89* 100* 109*   Medications:   (feeding supplement) PROSource Plus  30 mL Oral BID BM   allopurinol  100 mg Oral Once per day on Monday Wednesday Friday   atorvastatin  20 mg Oral Daily   calcitRIOL  1.5 mcg Oral Q T,Th,Sat-1800   Chlorhexidine Gluconate Cloth  6 each Topical Q0600   cinacalcet  60 mg Oral Q T,Th,Sat-1800   donepezil  10 mg Oral QHS   doxycycline  100 mg Oral BID   lactulose  30 g Oral BID   levothyroxine  75 mcg Oral Q0600   midodrine  5 mg Oral TID WC   pantoprazole  40 mg Oral Daily   sertraline  150 mg Oral Daily   sevelamer carbonate  800 mg Oral TID WC   sodium chloride flush  3 mL Intravenous  Q12H    Dialysis Orders: TTS at [facility] 3.5hr, 400/A1.5, EDW 79.5kg, 2K/2.5Ca bath, AVG, heparin 4000 unit bolus - Not reaching EDW prior to admit - typically 2-6kg up for past 3 weeks - Calcitriol 1.53mcg PO q HD - Sensipar 60mg  PO q HD - No ESA   Assessment/Plan: 1. Acute Hypoxic Resp Failure: Pulm edema/LE edema on exam, no respiratory distress. S/p extra HD 8/19. 2. ESRD: Usual TTS schedule - extra HD yesterday - now back on usual schedule, next HD tomorrow. 3. HypoTN/volume: Chronically low BP on mido 5mg  TID, UF as tolerated. May need to ^ mido to 10mg . EDW will be lowered on discharge. 4. Anemia of ESRD: Hgb > 12, no ESA needed. Abd [place] this admit without renal masses. 5. Secondary hyperparathyroidism:  Ca/Phos good. Continue binder (sevelamer) + VDRA. 6. Nutrition: Alb low, continue supplements. 7. Dementia 8. T2DM 9. DNR 10. Hepatic encephalopathy: Ammonia 120 on admit, improved with lactulose. 11.  Dispo: There was question of changing HD units - in progress, should not delay discharge once he is ready medically.   Ozzie Hoyle, PA-C 02/05/2023, 9:54 AM  [employer]

## 2023-02-05 NOTE — TOC Transition Note (Addendum)
Transition of Care [facility]) - CM/SW Discharge Note   Patient Details  Name: Tyler Hahn MRN: [id number] Date of Birth: Aug 27, 1940  Transition [facility]) CM/SW Contact:  Leone Haven, RN Phone Number: 02/05/2023, 11:11 AM   Clinical Narrative:    Patient is for dc today, he is set up with [facility] for HHPT, HHOT, and SW.  NCM offered choice to son for outpatient palliative services with dementia program, [facility], NCM made referral to [facility] for outpatient palliative services and dementia program.  NCM notified [facility] of dc today.  Per Dennard Nip he is not eligible for the dementia program, it only services patient with traditional medicare, but they will still service with outpatient palliative services.   Final next level of care: Home w Home Health Services Barriers to Discharge: No Barriers Identified   Patient Goals and CMS Choice CMS Medicare.gov Compare Post Acute Care list provided to:: Patient Represenative (must comment) Choice offered to / list presented to : Adult Children  Discharge Placement                         Discharge Plan and Services Additional resources added to the After Visit Summary for                  DME Arranged: N/A DME Agency: NA       HH Arranged: PT, OT, Social Work [employer] Agency: [facility] Date [facility] Agency Contacted: 01/23/23 Time HH Agency Contacted: 1303 Representative spoke with at [facility] Agency: Kandee Keen  Social Determinants of Health (SDOH) Interventions SDOH Screenings   Food Insecurity: No Food Insecurity (02/01/2023)  Housing: Low Risk  (02/01/2023)  Transportation Needs: No Transportation Needs (02/01/2023)  Utilities: Not At Risk (02/01/2023)  Alcohol Screen: Low Risk  (12/04/2022)  Depression (PHQ2-9): Medium Risk (01/16/2023)  Financial Resource Strain: Low Risk  (12/04/2022)  Physical Activity: Insufficiently Active (12/04/2022)  Social Connections: Moderately Integrated (12/04/2022)   Stress: No Stress Concern Present (12/04/2022)  Tobacco Use: Medium Risk (02/01/2023)     Readmission Risk Interventions    01/23/2023   12:57 PM  Readmission Risk Prevention Plan  Transportation Screening Complete  PCP or Specialist Appt within 5-7 Days --  Home Care Screening Complete  Medication Review (RN CM) Complete

## 2023-02-05 NOTE — Progress Notes (Signed)
   Palliative Medicine Inpatient Follow Up Note HPI: Pt is an 82 y.o. male admitted 02/01/23 with fatigue, decreased responsiveness and unable to walk or get out of bed. Of note, pt recently admitted 01/21/23 with lactic acidosis, hypoglycemia. PMH includes ESRD on HD, CHF, AICD, HLD, gout, GERD, dementia.    Palliative care has been asked to get involved to re-address goals of care.  Today's Discussion 02/05/2023  *Please note that this is a verbal dictation therefore any spelling or grammatical errors are due to the "Dragon Medical One" system interpretation.  Chart reviewed inclusive of vital signs, progress notes, laboratory results, and diagnostic images.   I met with Tyler Hahn at bedside this morning. He had just eaten breakfast and was resting comfortably. He denied pain, shortness of breath, or nausea at the time of assessment.   Plan for [name] to discharge today. His HD placement has been accepted for transfer per Renal Navigator, French Ana.   Questions and concerns addressed/Palliative Support Provided.   Objective Assessment: Vital Signs Vitals:   02/05/23 0404 02/05/23 0726  BP: 98/60 (!) 96/52  Pulse:  78  Resp: 20 16  Temp: 97.9 F (36.6 C) 97.6 F (36.4 C)  SpO2: 95% 91%    Intake/Output Summary (Last 24 hours) at 02/05/2023 1103 Last data filed at 02/05/2023 1005 Gross per 24 hour  Intake 600 ml  Output 3000 ml  Net -2400 ml   Last Weight  Most recent update: 02/05/2023  4:07 AM    Weight  75.6 kg (166 lb 10.7 oz)            Gen:  Elderly AA M in NAD HEENT: moist mucous membranes CV: Regular rate and rhythm  PULM:  Breathing comfortably on RA ABD: soft/nontender  EXT: No edema  Neuro: Resting quietly  SUMMARY OF RECOMMENDATIONS   DNAR/DNI   Dialysis Center transfer approved  Plan for discharge today  Billing based on MDM: Low ______________________________________________________________________________________ [name] Varnell  Palliative Medicine Team Team Cell Phone: [phone] Please utilize secure chat with additional questions, if there is no response within 30 minutes please call the above phone number  Palliative Medicine Team providers are available by phone from 7am to 7pm daily and can be reached through the team cell phone.  Should this patient require assistance outside of these hours, please call the patient's attending physician.

## 2023-02-05 NOTE — Telephone Encounter (Signed)
Received form from [facility] Placed in Refill folder just as FYI This will be pt's primary pharmacy for most scripts moving forward If it's to treat acute illness such as antibiotic send to local pharmacy

## 2023-02-05 NOTE — Progress Notes (Addendum)
   This referral come in orginally for the Medicare program Dementia Care Services at home. However this pt is not a candidate for this program due to have a [employer] Medicare plan. The Dementia Care Services program is only eligible for pt's with traditional Medicare. Therefore, I spoke to the pt's CM with TOC Letha Cape to discuss our Palliative Care program Care Connection. She states this program will be suffice in offering additional services in home to support the with his chronic illnesses.    This pt was referred to our Care Connection program. This is a home-based Palliative care program that is provided by [facility]. We will follow the pt at home after discharge to assist with any chronic/acute symptom management needs related to their chronic illness. Our provider will see all pt's for Palliative care consults with ongoing nursing case management and SW visit/telehealth support. The pt will also have after-hours nursing support as well.  With this program the pt is eligible for other [facility] services at home with our program in place.  Thank you for this referral, Norm Parcel RN BSN [facility] [phone]

## 2023-02-05 NOTE — Progress Notes (Addendum)
Pt has been accepted at [facility] as a transfer. Pt will be TTS 11:45 am chair time. Pt can start tomorrow and will need to arrive between 11:00-11:15 to complete paperwork prior to treatment. Clinic requesting that a family member please come with pt to assist with paperwork. Contacted pt's son, Christiane Ha, to discuss the above arrangements/details. Son agreeable to plan and aware pt can start tomorrow at new clinic. Son states that he will go with pt to treatment to assist with paperwork. Arrangements added to AVS. Update provided to attending, renal PA, palliative provider, and RN CM. Orders to be sent to new clinic by renal PA. Navigator contacted [facility] to make clinic aware that pt will d/c today and start at new clinic tomorrow. [facility] aware pt will d/c today and start tomorrow.   Olivia Canter Renal Navigator [phone]

## 2023-02-05 NOTE — Discharge Summary (Signed)
Physician Discharge Summary  Tyler Hahn:[id number] DOB: 26-Apr-1941 DOA: 02/01/2023  PCP: Sheliah Hatch, MD  Admit date: 02/01/2023 Discharge date: 02/05/2023 Recommendations for Outpatient Follow-up:  Follow up with PCP in 1 weeks-call for appointment Please obtain BMP/CBC in one week  Discharge Dispo: home w/ hh with son/caregiver Discharge Condition: Stable Code Status:   Code Status: DNR Diet recommendation:  Diet Order             Diet Heart Room service appropriate? Yes; Fluid consistency: Thin  Diet effective now                    Brief/Interim Summary: 81 yom w/ HTN,HLD, systolic CHF s/p AICD, chronic hypotension, hypothyroidism, diet-controlled diabetes mellitus type 2, anxiety, and gout  presented after being noted to be acutely altered During admission more alert, but only oriented to self been which appears to be his baseline>additional history is obtained from his caregiver over the phone. Recent hospitalization 8/6-8/9 >fluid overload in the setting of ESRD on HD, got HD x2, had intermittent confusion that would resolve on its own.After getting home  the following week was noted to have moment of confusion where he could not remember how to walk>next day he was back to his normal self. Around 11:30 PM 8/16 patient was noted to be lethargic, unable to move his feet with complaints of nausea, and reported not to be himself for which she called EMS. He had been on antibiotics for a pressure ulcer of his second right toe. In ED: WJ:[id number] with respirations 12-24,BP 86/55-116/64, and O2 saturations noted to be as low as 74% with improvement on 2 L of [place]. Labs significant for platelets 64, potassium 4.4, CO2 22, BUN 49, creatinine 6.34, anion gap 24, lipase 92, ammonia 120, INR 1.4, BNP greater than 4500 CXR>diffuse increased interstitial markings thought to be chronic in nature with possible superimposed interstitial edema. CT scan of the head noted generalized  cerebral atrophy with chronic white matter small vessel ischemic changes, but otherwise no acute abnormality.  Patient was given lactulose 30 g p.o. He underwent workup for confusion and difficulty walking. Workup so far unrevealing except for elevated ammonia level CT head negative nonfocal and actually he is now back to baseline alert awake with pleasant confusion with baseline dementia.  Did well with PT OT and recommending home health PT OT.  Seen by palliative care as well.  Continue DNR.  In the process of changing his outpatient dialysis unit but will take some time he may need to continue 1 or 2 more sessions in his current dialysis unit. Plan of care discussed with patient's son agreeable for discharge home.    Discharge Diagnoses:  Principal Problem:   Acute hepatic encephalopathy (HCC) Active Problems:   ESRD on dialysis (HCC)   Acute respiratory failure with hypoxia (HCC)   Chronic hypotension   SYSTOLIC HEART FAILURE, CHRONIC   Pressure ulcer of toe   Type 2 diabetes mellitus with hyperlipidemia (HCC)   Hypothyroid   Dementia (HCC)   Thrombocytopenia (HCC)  Acute encephalopathy History of dementia: Patient having intermittent confusion acute on chronic with dementia, likely multifactorial in the setting of hypoxia fluid overload, and hyperammonemia-Workup so far with CT head negative ammonia level 120>given lactulose cont as PRN, avoid constipation.He is nonfocal, pleasantly confused with baseline dementia but much more alert awake. Working with PT OT.Ultrasound abdomen 8/17 questionable gallbladder wall thickening no gallstone, atrophic right kidney no focal lesion of the liver.  Pseudo Hypoglycemia Type 2 diabetes mellitus: A1c 7.1 on 8/6, not on any meds at home.  Having hypoglycemia on cbg check on finger stick while blood glucose check on lab at the same time shows normal or high glucose.  Discontinued further CBG check from fingerstick can use ear lobe or BMP.  Remains  stable. Recent Labs  Lab 02/02/23 1037 02/02/23 1658 02/03/23 1045 02/03/23 1048 02/03/23 1901  GLUCAP 96 72 <10* 152* 168*    Acute respiratory failure with hypoxia with fluid overload ESRD on HD TTS Chronic systolic CHF: Fluid being managed by nephrology with HD, on HD 8/20.EF 25 to 30% on echo 8/7 with severely dilated left ventricular internal cavity and diastolic function not on GDMT in the setting of hypotension and ESRD. Cont to monitor daily I/O,weight, electrolytes and net balance as below.Keep on  salt/fluid restricted diet and monitor in tele. In the process of transferring his HD to new center. In the process for arranging new outpatient dialysis center Net IO Since Admission: -4,201 mL [02/05/23 1019]  Filed Weights   02/04/23 0800 02/04/23 1136 02/05/23 0404  Weight: 76.4 kg 73 kg 75.6 kg    Recent Labs  Lab 02/01/23 0306 02/01/23 0343 02/01/23 2315 02/02/23 0040 02/02/23 1034 02/03/23 0925 02/04/23 0823  BNP  --  >4,500.0*  --   --   --   --   --   BUN 49*  --  29* 31* 35* 49* 71*  CREATININE 6.34*  --  4.59* 4.76* 5.19* 6.58* 7.59*  K 4.4  --  4.2 4.5 4.5 4.8 4.9  MG 2.0  --   --   --   --   --   --     Anemia of ESRD: Monitor hemoglobin transfuse for less than 7 g. Recent Labs  Lab 01/31/23 1143 02/01/23 0343 02/02/23 0040 02/03/23 0925 02/04/23 0829  HGB 13.1 13.5 11.6* 14.4 12.5*  HCT 41.7 40.9 35.1* 44.3 36.5*    Metabolic bone disease of CKD-calcium/Phos advised on Sensipar and p.o. vdra.  Chronic hypotension:cont midodrine   Pressure ulcer of second rt toe Caregiver feels to wound appears better.  Continues on doxycycline, offloading shoe and follow-up with podiatry.  ESR and CRP are relatively normal/slightly up crp  Thrombocytopenia,chronic: Acute on chronic.monitor platelet counts. Recent Labs  Lab 01/31/23 1143 02/01/23 0343 02/02/23 0040 02/03/23 0925 02/04/23 0829  PLT 134.0 Repeated and verified X2.* 64* 89* 100* 109*     Hypothyroidism: Continue Synthroid-dose was increased to 75 mcg.  TSH 9.6 will need recheck fast outpatient  Hyperlipidemia: Cont statins  GERD: Continue PPI  Goals of care: Continue supportive care DNR.  Per family request palliative care has been consulted and has been on board.  Input appreciated  Consults: Palliative care, nephrology Subjective: Alert awake oriented to self people baseline  Discharge Exam: Vitals:   02/05/23 0404 02/05/23 0726  BP: 98/60 (!) 96/52  Pulse:  78  Resp: 20 16  Temp: 97.9 F (36.6 C) 97.6 F (36.4 C)  SpO2: 95% 91%   General: Pt is alert, awake, not in acute distress Cardiovascular: RRR, S1/S2 +, no rubs, no gallops Respiratory: CTA bilaterally, no wheezing, no rhonchi Abdominal: Soft, NT, ND, bowel sounds + Extremities: no edema, no cyanosis  Discharge Instructions  Discharge Instructions     Discharge instructions   Complete by: As directed    Please call call MD or return to ER for similar or worsening recurring problem that brought you to hospital  or if any fever,nausea/vomiting,abdominal pain, uncontrolled pain, chest pain,  shortness of breath or any other alarming symptoms.  Please follow-up your doctor as instructed in a week time and call the office for appointment.  Please avoid alcohol, smoking, or any other illicit substance and maintain healthy habits including taking your regular medications as prescribed.  You were cared for by a hospitalist during your hospital stay. If you have any questions about your discharge medications or the care you received while you were in the hospital after you are discharged, you can call the unit and ask to speak with the hospitalist on call if the hospitalist that took care of you is not available.  Once you are discharged, your primary care physician will handle any further medical issues. Please note that NO REFILLS for any discharge medications will be authorized once you are  discharged, as it is imperative that you return to your primary care physician (or establish a relationship with a primary care physician if you do not have one) for your aftercare needs so that they can reassess your need for medications and monitor your lab values   Increase activity slowly   Complete by: As directed       Allergies as of 02/05/2023       Reactions   Sulfa Antibiotics Itching, Rash   Sulfonamide Derivatives Itching, Rash        Medication List     TAKE these medications    allopurinol 100 MG tablet Commonly known as: ZYLOPRIM TAKE 1 TABLET BY MOUTH 3 TIMES A WEEK ON MONDAY, WEDNESDAY AND FRIDAY   atorvastatin 20 MG tablet Commonly known as: LIPITOR Take 1 tablet (20 mg total) by mouth daily.   calcitRIOL 0.5 MCG capsule Commonly known as: ROCALTROL Take 3 capsules (1.5 mcg total) by mouth every Tuesday, Thursday, and Saturday at 6 PM.   cinacalcet 30 MG tablet Commonly known as: SENSIPAR Take 2 tablets (60 mg total) by mouth every Tuesday, Thursday, and Saturday at 6 PM.   donepezil 10 MG tablet Commonly known as: ARICEPT TAKE 1 TABLET BY MOUTH EVERYDAY AT BEDTIME   doxycycline 100 MG tablet Commonly known as: VIBRA-TABS Take 1 tablet (100 mg total) by mouth 2 (two) times daily.   lactulose 10 GM/15ML solution Commonly known as: CHRONULAC Take 15 mLs (10 g total) by mouth 2 (two) times daily as needed for mild constipation.   levothyroxine 50 MCG tablet Commonly known as: SYNTHROID Take 1 tablet (50 mcg total) by mouth daily.   loratadine 10 MG tablet Commonly known as: CLARITIN Take 1 tablet (10 mg total) by mouth daily.   midodrine 5 MG tablet Commonly known as: PROAMATINE Take 1 tablet (5 mg total) by mouth 3 (three) times daily with meals.   multivitamin with minerals tablet Take 1 tablet by mouth daily.   mupirocin ointment 2 % Commonly known as: BACTROBAN Apply 1 Application topically 2 (two) times daily.   omeprazole 20 MG  capsule Commonly known as: PRILOSEC Take 1 capsule (20 mg total) by mouth daily.   sertraline 100 MG tablet Commonly known as: ZOLOFT TAKE 1.5 TABLETS (150MG  TOTAL) BY MOUTH DAILY   sevelamer carbonate 800 MG tablet Commonly known as: RENVELA Take 800 mg by mouth 3 (three) times daily with meals.        Follow-up Information     Sheliah Hatch, MD Follow up in 1 week(s).   Specialty: Family Medicine Contact information: [phone]  [place] [address] [phone]                Allergies  Allergen Reactions   Sulfa Antibiotics Itching and Rash   Sulfonamide Derivatives Itching and Rash    The results of significant diagnostics from this hospitalization (including imaging, microbiology, ancillary and laboratory) are listed below for reference.    Microbiology: No results found for this or any previous visit (from the past 240 hour(s)).  Procedures/Studies: DG Foot Complete Left  Result Date: 02/03/2023 Please see detailed radiograph report in office note.  DG Foot Complete Right  Result Date: 02/03/2023 Please see detailed radiograph report in office note.  US Abdomen Complete  Result Date: 02/01/2023 CLINICAL DATA:  Pancreatitis, elevated lipase. EXAM: ABDOMEN ULTRASOUND COMPLETE COMPARISON:  Renal ultrasound dated 08/17/2019 and CT abdomen and pelvis dated 01/21/2023. FINDINGS: Gallbladder: No gallstones visualized. There is questionable gallbladder wall thickening up to 5 mm. No sonographic Murphy sign noted by sonographer. Common bile duct: Diameter: 3 mm Liver: No focal lesion identified. Within normal limits in parenchymal echogenicity. Portal vein is patent on color Doppler imaging with normal direction of blood flow towards the liver. IVC: Not well seen. Pancreas: Not well seen. Spleen: Size and appearance within normal limits. Right Kidney: Length: 8.2 cm. Echogenicity is increased and the kidney appears atrophic. No mass or hydronephrosis  visualized. Left Kidney: Not visualized. Abdominal aorta: No aneurysm visualized. Other findings: None. IMPRESSION: 1. Questionable gallbladder wall thickening. No gallstones visualized. 2. Atrophic right kidney. Electronically Signed   By: Romona Curls M.D.   On: 02/01/2023 12:14   DG Toe 2nd Right  Result Date: 02/01/2023 CLINICAL DATA:  Pressure ulcer. History of end-stage kidney disease and diabetes. Dementia. EXAM: RIGHT SECOND TOE COMPARISON:  None Available. FINDINGS: There is no signs of acute fracture or dislocation. No focal bone erosions identified. Diffuse soft tissue edema is noted. Second through fifth hammertoe deformities identified. IMPRESSION: 1. No acute bone abnormality. 2. Diffuse soft tissue edema. 3. Second through fifth hammertoe deformities. Electronically Signed   By: Signa Kell M.D.   On: 02/01/2023 10:51   CT HEAD WO CONTRAST ( )  Result Date: 02/01/2023 CLINICAL DATA:  Altered mental status. EXAM: CT HEAD WITHOUT CONTRAST TECHNIQUE: Contiguous axial images were obtained from the base of the skull through the vertex without intravenous contrast. RADIATION DOSE REDUCTION: This exam was performed according to the departmental dose-optimization program which includes automated exposure control, adjustment of the mA and/or kV according to patient size and/or use of iterative reconstruction technique. COMPARISON:  None Available. FINDINGS: Brain: There is moderate severity cerebral atrophy with widening of the extra-axial spaces and ventricular dilatation. There are areas of decreased attenuation within the white matter tracts of the supratentorial brain, consistent with microvascular disease changes. Vascular: There is marked severity bilateral cavernous carotid artery calcification. Skull: Normal. Negative for fracture or focal lesion. Sinuses/Orbits: No acute finding. Other: None. IMPRESSION: 1. Generalized cerebral atrophy with chronic white matter small vessel ischemic  changes. 2. No acute intracranial abnormality. Electronically Signed   By: Aram Candela M.D.   On: 02/01/2023 02:06   DG Chest Port 1 View  Result Date: 02/01/2023 CLINICAL DATA:  Altered mental status. EXAM: PORTABLE CHEST 1 VIEW COMPARISON:  January 21, 2023 FINDINGS: There is stable dual lead AICD positioning. The cardiac silhouette is mildly enlarged and unchanged in size. There is moderate severity calcification of the aortic arch. Low lung volumes are seen with mild, diffusely increased lung markings. Mild atelectasis  is noted within the bilateral lung bases. No pleural effusion or pneumothorax is identified. The visualized skeletal structures are unremarkable. IMPRESSION: Low lung volumes with mild, diffusely increased lung markings which are likely, in part, chronic in nature. A mild, superimposed component of interstitial edema cannot be excluded. Electronically Signed   By: Aram Candela M.D.   On: 02/01/2023 01:59   DG Foot Complete Right  Result Date: 01/28/2023 Please see detailed radiograph report in office note.  ECHOCARDIOGRAM COMPLETE  Result Date: 01/22/2023    ECHOCARDIOGRAM REPORT   Patient Name:   CHAYNCE COMUNALE Date of Exam: 01/22/2023 Medical Rec #:  [id number]        Height:       67.0 in Accession #:    [id number]       Weight:       193.3 lb Date of Birth:  1941-03-27       BSA:          1.993 m Patient Age:    81 years         BP:           110/66 mmHg Patient Gender: M                HR:           80 bpm. Exam Location:  Inpatient Procedure: 2D Echo, Color Doppler, Cardiac Doppler and Intracardiac            Opacification Agent Indications:    I50.31 Acute diastolic (congestive) heart failure  History:        Patient has no prior history of Echocardiogram examinations.                 CHF, Defibrillator; Risk Factors:Diabetes, Hypertension and                 Dyslipidemia.  Sonographer:    Harriette Bouillon RDCS Referring Phys: Therisa Doyne IMPRESSIONS  1. Poor  acoustical windows and forshortening of images during definity contrast limits accurate assessment of EF. Left ventricular ejection fraction, by estimation, is 25 to 30%. The left ventricle has severely decreased function. The left ventricle demonstrates global hypokinesis. The left ventricular internal cavity size was severely dilated. Left ventricular diastolic function could not be evaluated. There is the interventricular septum is flattened in diastole ('D' shaped left ventricle), consistent with right ventricular volume overload.  2. Right ventricular systolic function is severely reduced. The right ventricular size is normal.  3. The mitral valve is normal in structure. Mild mitral valve regurgitation. No evidence of mitral stenosis.  4. Tricuspid valve regurgitation is moderate.  5. The aortic valve is tricuspid. Aortic valve regurgitation is not visualized. Aortic valve sclerosis/calcification is present, without any evidence of aortic stenosis.  6. The inferior vena cava is normal in size with greater than 50% respiratory variability, suggesting right atrial pressure of 3 mmHg. FINDINGS  Left Ventricle: Left ventricular ejection fraction, by estimation, is 25 to 30%. The left ventricle has severely decreased function. The left ventricle demonstrates global hypokinesis. Definity contrast agent was given IV to delineate the left ventricular endocardial borders. The left ventricular internal cavity size was severely dilated. There is no left ventricular hypertrophy. The interventricular septum is flattened in diastole ('D' shaped left ventricle), consistent with right ventricular  volume overload. Left ventricular diastolic function could not be evaluated. Right Ventricle: The right ventricular size is normal. No increase in right ventricular wall thickness. Right ventricular systolic function is severely  reduced. Left Atrium: Left atrial size was normal in size. Right Atrium: Right atrial size was normal in  size. Pericardium: There is no evidence of pericardial effusion. Mitral Valve: The mitral valve is normal in structure. Mild mitral valve regurgitation. No evidence of mitral valve stenosis. Tricuspid Valve: The tricuspid valve is normal in structure. Tricuspid valve regurgitation is moderate . No evidence of tricuspid stenosis. Aortic Valve: The aortic valve is tricuspid. Aortic valve regurgitation is not visualized. Aortic valve sclerosis/calcification is present, without any evidence of aortic stenosis. Pulmonic Valve: The pulmonic valve was normal in structure. Pulmonic valve regurgitation is mild. No evidence of pulmonic stenosis. Aorta: The aortic root is normal in size and structure. Venous: The inferior vena cava is normal in size with greater than 50% respiratory variability, suggesting right atrial pressure of 3 mmHg. IAS/Shunts: No atrial level shunt detected by color flow Doppler. Additional Comments: A device lead is visualized.  LEFT VENTRICLE PLAX 2D LVIDd:         6.30 cm LVIDs:         5.50 cm LV PW:         0.80 cm LV IVS:        0.80 cm LVOT diam:     2.10 cm LVOT Area:     3.46 cm  LV Volumes (MOD) LV vol d, MOD A2C: 127.0 ml LV vol d, MOD A4C: 169.0 ml LV vol s, MOD A2C: 81.2 ml LV vol s, MOD A4C: 121.0 ml LV SV MOD A2C:     45.8 ml LV SV MOD A4C:     169.0 ml LV SV MOD BP:      47.6 ml RIGHT VENTRICLE TAPSE (M-mode): 0.5 cm LEFT ATRIUM           Index LA Vol (A4C): 68.1 ml 34.16 ml/m   AORTA Ao Root diam: 3.40 cm Ao Asc diam:  3.30 cm MITRAL VALVE               TRICUSPID VALVE MV Area (PHT): 4.21 cm    TR Peak grad:   19.5 mmHg MV Decel Time: 180 msec    TR Vmax:        221.00 cm/s MR PISA:        1.01 cm MR PISA Radius: 0.40 cm    SHUNTS MV E velocity: 88.30 cm/s  Systemic Diam: 2.10 cm Armanda Magic MD Electronically signed by Armanda Magic MD Signature Date/Time: 01/22/2023/1:11:34 PM    Final    CT Angio Chest Pulmonary Embolism (PE) W or WO Contrast  Result Date: 01/22/2023 CLINICAL  DATA:  Pulmonary embolus suspected EXAM: CT ANGIOGRAPHY CHEST WITH CONTRAST TECHNIQUE: Multidetector CT imaging of the chest was performed using the standard protocol during bolus administration of intravenous contrast. Multiplanar CT image reconstructions and MIPs were obtained to evaluate the vascular anatomy. RADIATION DOSE REDUCTION: This exam was performed according to the departmental dose-optimization program which includes automated exposure control, adjustment of the mA and/or kV according to patient size and/or use of iterative reconstruction technique. CONTRAST:  75mL OMNIPAQUE IOHEXOL 350 MG/ML SOLN COMPARISON:  Chest CT dated May 31, 2022 FINDINGS: Cardiovascular: No evidence of central pulmonary embolus, limited evaluation of the segmental and subsegmental pulmonary arteries due to motion artifact. Cardiomegaly. No pericardial effusion. Normal caliber thoracic aorta with moderate atherosclerotic disease. Severe coronary artery calcifications. Locules of gas are seen in the right atrial appendage, likely injection related. Left chest wall AICD with leads in the right atrium and  right ventricle. Mediastinum/Nodes: Esophagus and thyroid are unremarkable. No enlarged lymph nodes seen in the chest. Lungs/Pleura: Central airways are patent. Bilateral bronchial wall thickening and mild bilateral interstitial thickening. Left lower lobe atelectasis. Small right pleural effusion. Solid pulmonary nodule of the right middle lobe measuring 13 x 8 mm on series 9, image 30, unchanged when compared with the prior. No pneumothorax. Upper Abdomen: No acute abnormality. Musculoskeletal: No chest wall abnormality. No acute or significant osseous findings. Review of the MIP images confirms the above findings. IMPRESSION: 1. No evidence of central pulmonary embolus, limited evaluation of the segmental and subsegmental pulmonary arteries due to motion artifact. 2. Bilateral bronchial wall thickening and mild bilateral  interstitial thickening, suggestive of pulmonary edema. 3. Small right pleural effusion. 4. Solid pulmonary nodule of the right middle lobe measuring 13 mm, unchanged when compared with the prior. Consider additional 6-12 month follow-up to ensure continued stability. 5. Coronary artery calcifications and aortic Atherosclerosis (ICD10-I70.0). Electronically Signed   By: Allegra Lai M.D.   On: 01/22/2023 10:44   CT VENOGRAM ABD/PELVIS/LOWER EXT BILAT  Result Date: 01/21/2023 CLINICAL DATA:  Worsening edema and legs. EXAM: CT VENOGRAM ABDOMEN AND PELVIS AND LOWER EXTREMITY BILATERAL TECHNIQUE: Venographic phase images of the abdomen, pelvis and lower extremities were obtained following the administration of intravenous contrast. Multiplanar reformats and maximum intensity projections were generated. RADIATION DOSE REDUCTION: This exam was performed according to the departmental dose-optimization program which includes automated exposure control, adjustment of the mA and/or kV according to patient size and/or use of iterative reconstruction technique. CONTRAST:  OMNIPAQUE IOHEXOL 350 MG/ML SOLN COMPARISON:  None Available. FINDINGS: Lower chest: Cardiomegaly. Pacer wires partially visualized in the right heart. Aortic atherosclerosis. Trace bilateral pleural effusions. Hepatobiliary: Reflux of contrast into the IVC and hepatic veins compatible with right heart dysfunction. No focal hepatic abnormality. Gallbladder unremarkable. Pancreas: No focal abnormality or ductal dilatation. Spleen: No focal abnormality.  Normal size. Adrenals/Urinary Tract: Adrenal glands normal. Kidneys are atrophic. Numerous bilateral renal cysts which appear benign. No follow-up imaging recommended. No visible stones or hydronephrosis. Urinary bladder decompressed, grossly unremarkable. Stomach/Bowel: Normal appendix. Sigmoid diverticulosis. No active diverticulitis. Stomach and small bowel decompressed, unremarkable.  Vascular/Lymphatic: Diffuse aortoiliac atherosclerosis. No aneurysm or dissection. No adenopathy. No visible IVC or iliac venous clot. Reproductive: No visible focal abnormality. Other: No free fluid or free air. Musculoskeletal: No acute bony abnormality. IVC: No evidence for thrombus or stenosis. Portal and mesenteric veins: No evidence for thrombus or stenosis. Bilateral iliac veins: No evidence for thrombus or stenosis. Right lower extremity: No evidence for thrombus or stenosis to the level of the knee. Left lower extremity: No evidence for thrombus or stenosis to the level of the knee. IMPRESSION: No evidence of venous thrombus in the abdomen, pelvis or lower extremities to the level of the knee. Cardiomegaly. Reflux of contrast into the hepatic veins and IVC suggest right heart dysfunction. Aortoiliac atherosclerosis. Sigmoid diverticulosis. Trace bilateral pleural effusions. Electronically Signed   By: Charlett Nose M.D.   On: 01/21/2023 17:33   DG Chest 2 View  Result Date: 01/21/2023 CLINICAL DATA:  Shortness of breath EXAM: CHEST - 2 VIEW COMPARISON:  X-ray 05/31/2022 FINDINGS: Enlarged cardiopericardial silhouette. Central vascular congestion. Calcified tortuous aorta. Left upper chest defibrillator. Underinflation. No consolidation, pneumothorax or edema. Overlapping cardiac leads. Lateral view is limited by under penetration. IMPRESSION: Enlarged heart with vascular congestion.  Defibrillator. Underinflation. Limited lateral view Electronically Signed   By: Piedad Climes.D.  On: 01/21/2023 16:15   US Venous Img Lower Bilateral (DVT)  Result Date: 01/17/2023 CLINICAL DATA:  82 year old male with swelling EXAM: BILATERAL LOWER EXTREMITY VENOUS DOPPLER ULTRASOUND TECHNIQUE: Gray-scale sonography with graded compression, as well as color Doppler and duplex ultrasound were performed to evaluate the lower extremity deep venous systems from the level of the common femoral vein and including the common  femoral, femoral, profunda femoral, popliteal and calf veins including the posterior tibial, peroneal and gastrocnemius veins when visible. The superficial great saphenous vein was also interrogated. Spectral Doppler was utilized to evaluate flow at rest and with distal augmentation maneuvers in the common femoral, femoral and popliteal veins. COMPARISON:  None Available. FINDINGS: RIGHT LOWER EXTREMITY Common Femoral Vein: No evidence of thrombus. Normal compressibility, respiratory phasicity and response to augmentation. Saphenofemoral Junction: No evidence of thrombus. Normal compressibility and flow on color Doppler imaging. Profunda Femoral Vein: No evidence of thrombus. Normal compressibility and flow on color Doppler imaging. Femoral Vein: No evidence of thrombus. Normal compressibility, respiratory phasicity and response to augmentation. Popliteal Vein: No evidence of thrombus. Normal compressibility, respiratory phasicity and response to augmentation. Calf Veins: No evidence of thrombus. Normal compressibility and flow on color Doppler imaging. Superficial Great Saphenous Vein: No evidence of thrombus. Normal compressibility and flow on color Doppler imaging. Other Findings:  Edema LEFT LOWER EXTREMITY Common Femoral Vein: No evidence of thrombus. Normal compressibility, respiratory phasicity and response to augmentation. Saphenofemoral Junction: No evidence of thrombus. Normal compressibility and flow on color Doppler imaging. Profunda Femoral Vein: No evidence of thrombus. Normal compressibility and flow on color Doppler imaging. Femoral Vein: No evidence of thrombus. Normal compressibility, respiratory phasicity and response to augmentation. Popliteal Vein: No evidence of thrombus. Normal compressibility, respiratory phasicity and response to augmentation. Calf Veins: No evidence of thrombus. Normal compressibility and flow on color Doppler imaging. Superficial Great Saphenous Vein: No evidence of  thrombus. Normal compressibility and flow on color Doppler imaging. Other Findings:  Edema IMPRESSION: Directed duplex of the bilateral lower extremities negative for DVT. Bilateral edema Signed, Yvone Neu. Miachel Roux, RPVI [facility] Electronically Signed   By: Gilmer Mor D.O.   On: 01/17/2023 10:41    Labs: BNP (last 3 results) Recent Labs    05/31/22 1549 01/22/23 0634 02/01/23 0343  BNP >4,500.0* >4,500.0* >4,500.0*   Basic Metabolic Panel: Recent Labs  Lab 02/01/23 0306 02/01/23 2102 02/01/23 2315 02/02/23 0040 02/02/23 1034 02/03/23 0925 02/04/23 0823  NA 137  --  134* 135 132* 133* 131*  K 4.4  --  4.2 4.5 4.5 4.8 4.9  CL 91*  --  91* 92* 89* 90* 92*  CO2 22  --  26 28 24  21* 20*  GLUCOSE 107*  --  138* 100* 201* 135* 166*  BUN 49*  --  29* 31* 35* 49* 71*  CREATININE 6.34*  --  4.59* 4.76* 5.19* 6.58* 7.59*  CALCIUM 9.5  --  9.2 8.9 9.2 8.9 8.8*  MG 2.0  --   --   --   --   --   --   PHOS  --  2.6  --  3.7  --   --   --    Liver Function Tests: Recent Labs  Lab 02/01/23 0306 02/02/23 0040  AST 31  --   ALT 23  --   ALKPHOS 104  --   BILITOT 0.5  --   PROT 8.0  --   ALBUMIN 3.3*  2.9*   Recent Labs  Lab 02/01/23 0306  LIPASE 92*   Recent Labs  Lab 02/01/23 0343 02/04/23 0823  AMMONIA 120* 39*   CBC: Recent Labs  Lab 01/31/23 1143 02/01/23 0343 02/02/23 0040 02/03/23 0925 02/04/23 0829  WBC 8.3 6.1 5.3 8.8 8.8  NEUTROABS 6.3  --   --   --   --   HGB 13.1 13.5 11.6* 14.4 12.5*  HCT 41.7 40.9 35.1* 44.3 36.5*  MCV 95.8 94.5 90.7 91.7 89.5  PLT 134.0 Repeated and verified X2.* 64* 89* 100* 109*   Cardiac Enzymes: Recent Labs  Lab 02/01/23 0306  CKTOTAL 56   BNP: Invalid input(s): "POCBNP" CBG: Recent Labs  Lab 02/02/23 1037 02/02/23 1658 02/03/23 1045 02/03/23 1048 02/03/23 1901  GLUCAP 96 72 <10* 152* 168*      Component Value Date/Time   COLORURINE YELLOW  (A) 08/30/2019 2325   APPEARANCEUR CLOUDY (A) 08/30/2019 2325   LABSPEC 1.011 08/30/2019 2325   PHURINE 5.0 08/30/2019 2325   GLUCOSEU NEGATIVE 08/30/2019 2325   HGBUR LARGE (A) 08/30/2019 2325   BILIRUBINUR NEGATIVE 08/30/2019 2325   BILIRUBINUR negative 07/28/2014 1419   KETONESUR NEGATIVE 08/30/2019 2325   PROTEINUR 30 (A) 08/30/2019 2325   UROBILINOGEN 0.2 07/28/2014 1419   NITRITE POSITIVE (A) 08/30/2019 2325   LEUKOCYTESUR LARGE (A) 08/30/2019 2325   Sepsis Labs Recent Labs  Lab 02/01/23 0343 02/02/23 0040 02/03/23 0925 02/04/23 0829  WBC 6.1 5.3 8.8 8.8   Microbiology No results found for this or any previous visit (from the past 240 hour(s)).   Time coordinating discharge: 25 minutes  SIGNED: Lanae Boast, MD  [facility] 02/05/2023, 10:19 AM  If 7PM-7AM, please contact night-coverage [email or web address]

## 2023-02-05 NOTE — Plan of Care (Signed)

## 2023-02-05 NOTE — Discharge Planning (Signed)
  [facility] Dialysis Discharge Orders    Patient Name: Tyler Hahn  Admission Date: 02/01/2023 Discharge Date: 02/05/2023 Dialysis Unit: TRANSFERRING FROM NW TO [facility] CLINIC  Admitting Diagnosis: Acute hepatic encephalopathy (HCC) [K76.82] Altered mental status, unspecified altered mental status type [R41.82] Acute Hypoxic Respiratory Failure  Discharge Labs: Basic Metabolic Panel: Recent Labs  Lab 02/01/23 2102 02/01/23 2315 02/02/23 0040 02/02/23 1034 02/03/23 0925 02/04/23 0823  NA  --    < > 135 132* 133* 131*  K  --    < > 4.5 4.5 4.8 4.9  CL  --    < > 92* 89* 90* 92*  CO2  --    < > 28 24 21* 20*  GLUCOSE  --    < > 100* 201* 135* 166*  BUN  --    < > 31* 35* 49* 71*  CREATININE  --    < > 4.76* 5.19* 6.58* 7.59*  CALCIUM  --    < > 8.9 9.2 8.9 8.8*  PHOS 2.6  --  3.7  --   --   --    < > = values in this interval not displayed.   CBC: Recent Labs  Lab 01/31/23 1143 02/01/23 0343 02/02/23 0040 02/03/23 0925 02/04/23 0829  WBC 8.3 6.1 5.3 8.8 8.8  NEUTROABS 6.3  --   --   --   --   HGB 13.1 13.5 11.6* 14.4 12.5*  HCT 41.7 40.9 35.1* 44.3 36.5*  MCV 95.8 94.5 90.7 91.7 89.5  PLT 134.0 Repeated and verified X2.* 64* 89* 100* 109*    Dialysis Orders: 3.5hr 180 dialyzer EDW 75kg (reduced this admit) 2K/2.5Ca bath AVG  MEDS:  ESA: No transfusion or ESA this admit, Hgb > 12.  None ordered as outpatient  BMM: Calcitriol 1.25mcg PO q HD Sensipar  60mg  PO q HD  Heparin: No change: 4000 unit bolus q HD  IV Antibiotics (at discharge): none  Coumadin: none  Other/Appts/Lab orders: per clinic usual schedule.    Julien Nordmann, PA-C 02/05/2023, 1:17 PM  [employer] [phone]

## 2023-02-05 NOTE — Progress Notes (Signed)
Physical Therapy Treatment Patient Details Name: Tyler Hahn MRN: [id number] DOB: 04-03-1941 Today's Date: 02/05/2023   History of Present Illness Pt is an 82 y.o. male admitted 02/01/23 with fatigue, decreased responsiveness and unable to walk or get out of bed. Of note, pt recently admitted 01/21/23 with lactic acidosis, hypoglycemia. PMH includes ESRD on HD, CHF, AICD, HLD, gout, GERD, dementia.    PT Comments  Pt admitted with above diagnosis. Pt was able to ambulate with RW with min cues and contact guard assist.  Pt incr distance and plan is for home with family support today.  Pt currently with functional limitations due to the deficits listed below (see PT Problem List). Pt will benefit from acute skilled PT to increase their independence and safety with mobility to allow discharge.       If plan is discharge home, recommend the following: A lot of help with walking and/or transfers;A lot of help with bathing/dressing/bathroom;Assistance with cooking/housework;Direct supervision/assist for medications management;Assist for transportation;Supervision due to cognitive status   Can travel by private vehicle        Equipment Recommendations  Rolling walker (2 wheels)    Recommendations for Other Services OT consult     Precautions / Restrictions Precautions Precautions: Fall Restrictions Weight Bearing Restrictions: No     Mobility  Bed Mobility Overal bed mobility: Needs Assistance Bed Mobility: Supine to Sit     Supine to sit: Min assist     General bed mobility comments: Needed a little assist for elevation of trunk    Transfers Overall transfer level: Needs assistance Equipment used: Rolling walker (2 wheels) Transfers: Sit to/from Stand, Bed to chair/wheelchair/BSC Sit to Stand: Min assist           General transfer comment: Min assist to stand from chair; tends to have a posterior lean, with feet anterior to center of mass; cues to step for better foot  placement to correct; Once up, balance improves    Ambulation/Gait Ambulation/Gait assistance: Min assist Gait Distance (Feet): 140 Feet Assistive device: Rolling walker (2 wheels) Gait Pattern/deviations: Step-through pattern, Decreased stride length, Trunk flexed, Drifts right/left Gait velocity: Decreased Gait velocity interpretation: 1.31 - 2.62 ft/sec, indicative of limited community ambulator   General Gait Details: slow, mostly steady gait with RW and CGA for balance with pt needing min cues for safety with RW, to stay close to RW.   Stairs             Wheelchair Mobility     Tilt Bed    Modified Rankin (Stroke Patients Only)       Balance Overall balance assessment: Needs assistance Sitting-balance support: No upper extremity supported, Feet supported Sitting balance-Leahy Scale: Fair     Standing balance support: Bilateral upper extremity supported, During functional activity, Reliant on assistive device for balance Standing balance-Leahy Scale: Poor Standing balance comment: Persistent posterior lean initially, relies on RW for balance and +2 for safety with dynamic movement                            Cognition Arousal: Alert Behavior During Therapy: WFL for tasks assessed/performed Overall Cognitive Status: History of cognitive impairments - at baseline                                 General Comments: h/o dementia, requires increased cues to follow commands.  Exercises      General Comments General comments (skin integrity, edema, etc.): VSS      Pertinent Vitals/Pain Pain Assessment Pain Assessment: No/denies pain    Home Living                          Prior Function            PT Goals (current goals can now be found in the care plan section) Progress towards PT goals: Progressing toward goals    Frequency    Min 1X/week      PT Plan      Co-evaluation               AM-PAC PT "6 Clicks" Mobility   Outcome Measure  Help needed turning from your back to your side while in a flat bed without using bedrails?: A Little Help needed moving from lying on your back to sitting on the side of a flat bed without using bedrails?: A Little Help needed moving to and from a bed to a chair (including a wheelchair)?: A Little Help needed standing up from a chair using your arms (e.g., wheelchair or bedside chair)?: A Little Help needed to walk in hospital room?: A Little Help needed climbing 3-5 steps with a railing? : Total 6 Click Score: 16    End of Session Equipment Utilized During Treatment: Gait belt Activity Tolerance: Patient tolerated treatment well Patient left: in chair;with call bell/phone within reach;with chair alarm set Nurse Communication: Mobility status PT Visit Diagnosis: Other abnormalities of gait and mobility (R26.89);Muscle weakness (generalized) (M62.81)     Time: 1207-1223 PT Time Calculation (min) (ACUTE ONLY): 16 min  Charges:    $Gait Training: 8-22 mins PT General Charges $$ ACUTE PT VISIT: 1 Visit                     Kamarie Palma M,PT Acute Rehab Services [phone]    Bevelyn Buckles 02/05/2023, 2:14 PM

## 2023-02-05 NOTE — Telephone Encounter (Signed)
Spoke to pt son Christiane Ha and he states they did make the change [facility] . I have updated this information in pt chart

## 2023-02-05 NOTE — Plan of Care (Signed)
  Problem: Metabolic: Goal: Ability to maintain appropriate glucose levels will improve Outcome: Progressing   Problem: Nutritional: Goal: Maintenance of adequate nutrition will improve Outcome: Progressing   Problem: Tissue Perfusion: Goal: Adequacy of tissue perfusion will improve Outcome: Progressing

## 2023-02-06 ENCOUNTER — Telehealth: Payer: Self-pay

## 2023-02-06 ENCOUNTER — Other Ambulatory Visit: Payer: Self-pay | Admitting: Family Medicine

## 2023-02-06 DIAGNOSIS — D689 Coagulation defect, unspecified: Secondary | ICD-10-CM | POA: Diagnosis not present

## 2023-02-06 DIAGNOSIS — K219 Gastro-esophageal reflux disease without esophagitis: Secondary | ICD-10-CM

## 2023-02-06 DIAGNOSIS — N2581 Secondary hyperparathyroidism of renal origin: Secondary | ICD-10-CM | POA: Diagnosis not present

## 2023-02-06 DIAGNOSIS — N186 End stage renal disease: Secondary | ICD-10-CM | POA: Diagnosis not present

## 2023-02-06 DIAGNOSIS — E1122 Type 2 diabetes mellitus with diabetic chronic kidney disease: Secondary | ICD-10-CM | POA: Diagnosis not present

## 2023-02-06 DIAGNOSIS — Z992 Dependence on renal dialysis: Secondary | ICD-10-CM | POA: Diagnosis not present

## 2023-02-06 DIAGNOSIS — R413 Other amnesia: Secondary | ICD-10-CM

## 2023-02-06 DIAGNOSIS — D509 Iron deficiency anemia, unspecified: Secondary | ICD-10-CM | POA: Diagnosis not present

## 2023-02-06 DIAGNOSIS — E039 Hypothyroidism, unspecified: Secondary | ICD-10-CM

## 2023-02-06 DIAGNOSIS — Z111 Encounter for screening for respiratory tuberculosis: Secondary | ICD-10-CM | POA: Diagnosis not present

## 2023-02-06 NOTE — Transitions of Care (Post Inpatient/ED Visit) (Signed)
02/06/2023  Name: Tyler Hahn MRN: [id number] DOB: [birth year]  Today's TOC FU Call Status: Today's TOC FU Call Status:: Successful TOC FU Call Completed TOC FU Call Complete Date: 02/06/23 (Call completed with son-Jonathan.)  Transition Care Management Follow-up Telephone Call Date of Discharge: 02/05/23 Discharge Facility: [facility]) Type of Discharge: Inpatient Admission Primary Inpatient Discharge Diagnosis:: "AMS,unspecified AMS type" How have you been since you were released from the hospital?: Better (Son voices pt is doing better. He rested well last night.He went to HD tx today. Appetite is  "doing okay. Pt had BM last night.) Any questions or concerns?: No  Items Reviewed: Did you receive and understand the discharge instructions provided?: Yes Medications obtained,verified, and reconciled?: Partial Review Completed Reason for Partial Mediation Review: son driving/in car Any new allergies since your discharge?: No Dietary orders reviewed?: Yes Type of Diet Ordered:: renal/low salt/heart healthy/carb modified Do you have support at home?: Yes People in Home: child(ren), adult Name of Support/Comfort Primary Source: son-Jonathan, cpaid caregiver in the home as well  Medications Reviewed Today: Medications Reviewed Today   Medications were not reviewed in this encounter     Home Care and Equipment/Supplies: Were Home Health Services Ordered?: Yes Name of Home Health Agency:: [facility] Has Agency set up a time to come to your home?: No (Son has contact info as agency was active prior to admission-he will call and follow up if he has not heard from them within 48hrs post discharge) Any new equipment or medical supplies ordered?: NA  Functional Questionnaire: Do you need assistance with bathing/showering or dressing?: Yes Do you need assistance with meal preparation?: Yes Do you need assistance with eating?: No Do you have difficulty maintaining continence:  Yes Do you need assistance with getting out of bed/getting out of a chair/moving?: Yes Do you have difficulty managing or taking your medications?: Yes  Follow up appointments reviewed: PCP Follow-up appointment confirmed?: Yes Date of PCP follow-up appointment?: 02/12/23 Follow-up Provider: Dr. Beverely Low Specialist [facility] Follow-up appointment confirmed?: Yes Date of Specialist follow-up appointment?: 02/07/23 Follow-Up Specialty Provider:: Dr. Irene Limbo Do you need transportation to your follow-up appointment?: No (son and/or caregiver takes pt to all appts) Do you understand care options if your condition(s) worsen?: Yes-patient verbalized understanding  SDOH Interventions Today    Flowsheet Row Most Recent Value  SDOH Interventions   Food Insecurity Interventions Intervention Not Indicated  Transportation Interventions Intervention Not Indicated      TOC Interventions Today    Flowsheet Row Most Recent Value  TOC Interventions   TOC Interventions Discussed/Reviewed TOC Interventions Discussed      Interventions Today    Flowsheet Row Most Recent Value  Chronic Disease   Chronic disease during today's visit Chronic Kidney Disease/End Stage Renal Disease (ESRD)  General Interventions   General Interventions Discussed/Reviewed General Interventions Discussed, Doctor Visits, Referral to Nurse  [follow up appt scheduled with RN care coordinator]  Doctor Visits Discussed/Reviewed Doctor Visits Discussed, PCP, Specialist  Education Interventions   Education Provided Provided Education  Provided Verbal Education On Nutrition, When to see the doctor, Medication  Nutrition Interventions   Nutrition Discussed/Reviewed Nutrition Discussed, Adding fruits and vegetables, Increasing proteins, Decreasing fats, Decreasing salt, Decreasing sugar intake, Fluid intake  Pharmacy Interventions   Pharmacy Dicussed/Reviewed Pharmacy Topics Discussed, Medications and their functions   Safety Interventions   Safety Discussed/Reviewed Safety Discussed, Fall Risk, Home Safety  Home Safety Assistive Devices  [son will discuss with HHPT/OT what medical equipment pt would benefit  from in the home to aide in safety]  Advanced Directive Interventions   Advanced Directives Discussed/Reviewed Advanced Directives Discussed  End of Life Palliative  [son confirms that PCS has contacted them]       Antionette Fairy, RN,BSN,CCM [facility] Care Management Care Management Community Coordinator Direct Phone: [phone] Toll Free: [phone] Fax: [phone]

## 2023-02-07 ENCOUNTER — Telehealth: Payer: Self-pay | Admitting: Family Medicine

## 2023-02-07 ENCOUNTER — Ambulatory Visit: Payer: Medicare HMO | Admitting: Podiatry

## 2023-02-07 ENCOUNTER — Ambulatory Visit: Payer: Medicare HMO

## 2023-02-07 DIAGNOSIS — L97521 Non-pressure chronic ulcer of other part of left foot limited to breakdown of skin: Secondary | ICD-10-CM

## 2023-02-07 DIAGNOSIS — M869 Osteomyelitis, unspecified: Secondary | ICD-10-CM | POA: Diagnosis not present

## 2023-02-07 NOTE — Telephone Encounter (Signed)
Ok to proceed. 

## 2023-02-07 NOTE — Telephone Encounter (Signed)
Home Health Verbal Orders  Agency: [facility]   Caller: [facility]  Call back #: [phone] Fax #:    Order to start Palliative Care

## 2023-02-07 NOTE — Telephone Encounter (Signed)
[facility] from [place] [facility] called and needs a verbal order to start Palliative care for pt

## 2023-02-07 NOTE — Progress Notes (Signed)
Subjective:   Patient ID: Tyler Hahn, male   DOB: 82 y.o.   MRN: [id number]   HPI Chief Complaint  Patient presents with   Wound Check    Osteomyelitis of second toe right foot. He continues to take the antibiotics. He has not had the ABI due to being at the hospital.    Foot Problem    Small cut to the left great toe-lateral border. It occurred at the hospital. Patient family member has been treating it at home.     82 year old male presents the office today for follow-up evaluation of ulceration to the right second toe.  States he is doing about the same.  Also he was recently in the hospital and there is a small cut in the left big toe that he states occurred while in the hospital.  No drainage or pus.  He has been hospitalized again since I saw him.  He has not follow-up with vascular surgery or have ABIs performed.       Objective:  Physical Exam  General: AAO x3, NAD  Dermatological: Ulceration present distal aspect the right second toe there is edema present to right second toe.  There is no probing to bone today.  There is no drainage or pus.  No fluctuation or crepitation and no malodor.  This appears to be stable.  On the left hallux distal lateral aspect superficial abrasion noted without any drainage or pus.  Preulcerative lesion noted on the dorsal left second PIPJ.  There is no drainage or pus.  There is no fluctuation or crepitation but there is no malodor.   Vascular: Unable to palpate pulse.  Neruologic: Decreased.  Musculoskeletal: Hammertoes present.     Assessment:   Ulceration, osteomyelitis right second toe; left hallux toenail avulsion     Plan:  -Treatment options discussed including all alternatives, risks, and complications -Etiology of symptoms were discussed -Repeat x-rays were obtained and reviewed with the patient.  3 views right and left foot were obtained.  Cortical changes the distal phalanx of the right second toe consistent with likely  chronic osteomyelitis. -Will continue doxycyline-he just got this. -Mupirocin dressing changes daily -Unfortunately had to cancel his vascular appointment given hospitalization.  They will contact to reschedule.  Discussed the importance of this.  I gave them the phone number to call to get this rescheduled. -ABI pending -Offloading -Unfortunately likely will need toe amputation. -Monitor for any clinical signs or symptoms of infection and directed to call the office immediately should any occur or go to the ER.  Vivi Barrack DPM

## 2023-02-07 NOTE — Telephone Encounter (Signed)
Spoke w/ D[facility]da [facility] at [facility] and gave the verbal order per Dr Beverely Low to start palliative care

## 2023-02-08 DIAGNOSIS — E1122 Type 2 diabetes mellitus with diabetic chronic kidney disease: Secondary | ICD-10-CM | POA: Diagnosis not present

## 2023-02-08 DIAGNOSIS — D509 Iron deficiency anemia, unspecified: Secondary | ICD-10-CM | POA: Diagnosis not present

## 2023-02-08 DIAGNOSIS — Z992 Dependence on renal dialysis: Secondary | ICD-10-CM | POA: Diagnosis not present

## 2023-02-08 DIAGNOSIS — D689 Coagulation defect, unspecified: Secondary | ICD-10-CM | POA: Diagnosis not present

## 2023-02-08 DIAGNOSIS — Z111 Encounter for screening for respiratory tuberculosis: Secondary | ICD-10-CM | POA: Diagnosis not present

## 2023-02-08 DIAGNOSIS — N2581 Secondary hyperparathyroidism of renal origin: Secondary | ICD-10-CM | POA: Diagnosis not present

## 2023-02-08 DIAGNOSIS — N186 End stage renal disease: Secondary | ICD-10-CM | POA: Diagnosis not present

## 2023-02-10 ENCOUNTER — Telehealth: Payer: Self-pay | Admitting: Family Medicine

## 2023-02-10 NOTE — Telephone Encounter (Signed)
Is it ok to give verbal for therapy

## 2023-02-10 NOTE — Telephone Encounter (Signed)
Ok for verbal order  °

## 2023-02-10 NOTE — Telephone Encounter (Signed)
Home Health called in their recommendation - therapy once a week for 7 weeks

## 2023-02-10 NOTE — Telephone Encounter (Signed)
Spoke to pt [name] and gave the verbal order to PT  once a week x 7 weeks

## 2023-02-11 DIAGNOSIS — N186 End stage renal disease: Secondary | ICD-10-CM | POA: Diagnosis not present

## 2023-02-11 DIAGNOSIS — N2581 Secondary hyperparathyroidism of renal origin: Secondary | ICD-10-CM | POA: Diagnosis not present

## 2023-02-11 DIAGNOSIS — D509 Iron deficiency anemia, unspecified: Secondary | ICD-10-CM | POA: Diagnosis not present

## 2023-02-11 DIAGNOSIS — Z111 Encounter for screening for respiratory tuberculosis: Secondary | ICD-10-CM | POA: Diagnosis not present

## 2023-02-11 DIAGNOSIS — Z992 Dependence on renal dialysis: Secondary | ICD-10-CM | POA: Diagnosis not present

## 2023-02-11 DIAGNOSIS — E1122 Type 2 diabetes mellitus with diabetic chronic kidney disease: Secondary | ICD-10-CM | POA: Diagnosis not present

## 2023-02-11 DIAGNOSIS — D689 Coagulation defect, unspecified: Secondary | ICD-10-CM | POA: Diagnosis not present

## 2023-02-12 ENCOUNTER — Inpatient Hospital Stay: Payer: Medicare HMO | Admitting: Family Medicine

## 2023-02-13 ENCOUNTER — Ambulatory Visit: Payer: Self-pay

## 2023-02-13 DIAGNOSIS — Z111 Encounter for screening for respiratory tuberculosis: Secondary | ICD-10-CM | POA: Diagnosis not present

## 2023-02-13 DIAGNOSIS — E1122 Type 2 diabetes mellitus with diabetic chronic kidney disease: Secondary | ICD-10-CM | POA: Diagnosis not present

## 2023-02-13 DIAGNOSIS — D509 Iron deficiency anemia, unspecified: Secondary | ICD-10-CM | POA: Diagnosis not present

## 2023-02-13 DIAGNOSIS — D689 Coagulation defect, unspecified: Secondary | ICD-10-CM | POA: Diagnosis not present

## 2023-02-13 DIAGNOSIS — N186 End stage renal disease: Secondary | ICD-10-CM | POA: Diagnosis not present

## 2023-02-13 DIAGNOSIS — Z992 Dependence on renal dialysis: Secondary | ICD-10-CM | POA: Diagnosis not present

## 2023-02-13 DIAGNOSIS — N2581 Secondary hyperparathyroidism of renal origin: Secondary | ICD-10-CM | POA: Diagnosis not present

## 2023-02-13 NOTE — Patient Instructions (Signed)
Visit Information  Thank you for taking time to visit with me today. Please don't hesitate to contact me if I can be of assistance to you.   Following are the goals we discussed today:   Goals Addressed             This Visit's Progress    Get Strength Back       Patient Goals/Self Care Activities: -Patient/Caregiver will take medications as prescribed   -Patient/Caregiver will attend all scheduled provider appointments -Patient/Caregiver will call provider office for new concerns or questions     Care Coordination Interventions: Assessed the Designated Party Release Christiane Ha understanding of chronic kidney disease    Evaluation of current treatment plan related to chronic kidney disease self management and patient's adherence to plan as established by provider       Spoke with son Christiane Ha. He reports patient doing okay since hospital discharge. Patient swelling to legs and feet are better and mental status better.  Patient going to dialysis with no concerns. [facility] Home [facility] involved. Discussed lift type chair. Son to inquire with therapy. Palliative consult still pending but appointment set.  Follow up with podiatry for foot wounds. Currently on doxycycline. No issues with medication. Medication through pill pack and working well.  No concerns.         Our next appointment is by telephone on 03/13/23 at 130 pm  Please call the care guide team at [phone] if you need to cancel or reschedule your appointment.   If you are experiencing a Mental Health or Behavioral Health Crisis or need someone to talk to, please call the Suicide and Crisis Lifeline: 988   Patient verbalizes understanding of instructions and care plan provided today and agrees to view in MyChart. Active MyChart status and patient understanding of how to access instructions and care plan via MyChart confirmed with patient.     The patient has been provided with contact information for the care management team  and has been advised to call with any health related questions or concerns.   Bary Leriche, RN, MSN [facility], [facility] Management Community Coordinator Direct Dial: [phone]  Fax: [phone] Website: [email or web address]

## 2023-02-13 NOTE — Patient Outreach (Signed)
  Care Coordination   Initial Visit Note   02/13/2023 Name: THANH WINGER MRN: [id number] DOB: 30-Apr-1941  DIONEL CARAVALHO is a 82 y.o. year old male who sees Tabori, Helane Rima, MD for primary care. I  spoke with son Christiane Ha by phone today  What matters to the patients health and wellness today?  Get strength back    Goals Addressed             This Visit's Progress    Get Strength Back       Patient Goals/Self Care Activities: -Patient/Caregiver will take medications as prescribed   -Patient/Caregiver will attend all scheduled provider appointments -Patient/Caregiver will call provider office for new concerns or questions     Care Coordination Interventions: Assessed the Designated Party Release Christiane Ha understanding of chronic kidney disease    Evaluation of current treatment plan related to chronic kidney disease self management and patient's adherence to plan as established by provider       Spoke with son Christiane Ha. He reports patient doing okay since hospital discharge. Patient swelling to legs and feet are better and mental status better.  Patient going to dialysis with no concerns. [facility] involved. Discussed lift type chair. Son to inquire with therapy. Palliative consult still pending but appointment set.  Follow up with podiatry for foot wounds. Currently on doxycycline. No issues with medication. Medication through pill pack and working well.  No concerns.         SDOH assessments and interventions completed:  Yes  SDOH Interventions Today    Flowsheet Row Most Recent Value  SDOH Interventions   Housing Interventions Intervention Not Indicated  Transportation Interventions Intervention Not Indicated        Care Coordination Interventions:  Yes, provided   Follow up plan: Follow up call scheduled for September    Encounter Outcome:  Pt. Visit Completed   Bary Leriche, RN, MSN [facility]  [facility], [facility] Management Community Coordinator Direct Dial: [phone]  Fax: [phone] Website: [email or web address]

## 2023-02-14 ENCOUNTER — Encounter: Payer: Self-pay | Admitting: Family Medicine

## 2023-02-14 ENCOUNTER — Telehealth: Payer: Self-pay | Admitting: Family Medicine

## 2023-02-14 NOTE — Progress Notes (Signed)
Error

## 2023-02-14 NOTE — Telephone Encounter (Signed)
 Received forms from [facility] Printed & placed in provider bin

## 2023-02-14 NOTE — Telephone Encounter (Signed)
Forms placed in Dr Tabori to be signed folder  

## 2023-02-15 ENCOUNTER — Other Ambulatory Visit: Payer: Self-pay | Admitting: Family Medicine

## 2023-02-15 DIAGNOSIS — N186 End stage renal disease: Secondary | ICD-10-CM | POA: Diagnosis not present

## 2023-02-15 DIAGNOSIS — I129 Hypertensive chronic kidney disease with stage 1 through stage 4 chronic kidney disease, or unspecified chronic kidney disease: Secondary | ICD-10-CM | POA: Diagnosis not present

## 2023-02-15 DIAGNOSIS — Z992 Dependence on renal dialysis: Secondary | ICD-10-CM | POA: Diagnosis not present

## 2023-02-15 DIAGNOSIS — Z111 Encounter for screening for respiratory tuberculosis: Secondary | ICD-10-CM | POA: Diagnosis not present

## 2023-02-15 DIAGNOSIS — E1122 Type 2 diabetes mellitus with diabetic chronic kidney disease: Secondary | ICD-10-CM | POA: Diagnosis not present

## 2023-02-15 DIAGNOSIS — D689 Coagulation defect, unspecified: Secondary | ICD-10-CM | POA: Diagnosis not present

## 2023-02-15 DIAGNOSIS — D509 Iron deficiency anemia, unspecified: Secondary | ICD-10-CM | POA: Diagnosis not present

## 2023-02-15 DIAGNOSIS — N2581 Secondary hyperparathyroidism of renal origin: Secondary | ICD-10-CM | POA: Diagnosis not present

## 2023-02-18 ENCOUNTER — Other Ambulatory Visit: Payer: Self-pay | Admitting: *Deleted

## 2023-02-18 ENCOUNTER — Ambulatory Visit: Payer: Medicare HMO | Admitting: Podiatry

## 2023-02-18 DIAGNOSIS — I739 Peripheral vascular disease, unspecified: Secondary | ICD-10-CM | POA: Diagnosis not present

## 2023-02-18 DIAGNOSIS — L97522 Non-pressure chronic ulcer of other part of left foot with fat layer exposed: Secondary | ICD-10-CM | POA: Diagnosis not present

## 2023-02-18 DIAGNOSIS — M869 Osteomyelitis, unspecified: Secondary | ICD-10-CM

## 2023-02-18 MED ORDER — CLINDAMYCIN HCL 300 MG PO CAPS: 300.0000 mg | ORAL_CAPSULE | Freq: Three times a day (TID) | ORAL | 0 refills | Status: DC

## 2023-02-18 MED ORDER — MUPIROCIN 2 % EX OINT: 1.0000 | TOPICAL_OINTMENT | Freq: Two times a day (BID) | CUTANEOUS | 2 refills | Status: DC

## 2023-02-18 NOTE — Progress Notes (Signed)
Subjective:   Patient ID: Tyler Hahn, male   DOB: 82 y.o.   MRN: [id number]   HPI Chief Complaint  Patient presents with   Wound Check    Pt      82 year old male presents the office today for follow-up evaluation of ulceration to the right second toe.  Also at last appointment he had an injury to the left big toe.  His family member states that nail was loose and he did take some of the loose tissue off.  No drainage or pus.  He still is a preulcerative area, wound on the left second toe as well.  He was able to reschedule the vascular surgery appointment.     Objective:  Physical Exam  General: AAO x3, NAD  Dermatological: Ulceration present distal aspect the right second toe there is edema present to right second toe.  This appears to be stable.  There is no fluctuation or crepitation.  No malodor.  Wound present now on the hallux nailbed on the left side without any probing to bone although it is close.  There is no drainage or pus.  No fluctuation or crepitation.  Small wound on the dorsal second PIPJ with any probing, undermining or tunneling.   Vascular: Unable to palpate pulse.  Neruologic: Decreased.  Musculoskeletal: Hammertoes present.     Assessment:   Ulceration, osteomyelitis right second toe; left hallux toenail avulsion     Plan:  -Treatment options discussed including all alternatives, risks, and complications -Etiology of symptoms were discussed -Right foot appears to be stable however wound seems to be worse in the left foot now.  I did discuss likely need for possible amputation, still quite concerned of circulation.  Unfortunately he did cancel the last vascular surgery appointment as he was in the hospital but he has now able  to rescheduled and is scheduled to see Dr. Lenell Antu on 03/04/2023. For now continue antibiotics.  Prescribed clindamycin.  Continue daily dressing changes and discussed with his any worsening he is to report directly to emergency  room. -Monitor for any clinical signs or symptoms of infection and directed to call the office immediately should any occur or go to the ER.  RTC 2 weeks, or sooner if needed.   Vivi Barrack DPM

## 2023-02-18 NOTE — Telephone Encounter (Signed)
From faxed and placed in scan

## 2023-02-18 NOTE — Telephone Encounter (Signed)
Form completed and returned to [facility] 

## 2023-02-19 ENCOUNTER — Telehealth: Payer: Self-pay | Admitting: Family Medicine

## 2023-02-19 NOTE — Telephone Encounter (Signed)
Forms placed in Dr Tabori to be signed folder  

## 2023-02-19 NOTE — Telephone Encounter (Signed)
Received forms from [facility] Printed & placed in provider bin

## 2023-02-22 ENCOUNTER — Other Ambulatory Visit: Payer: Self-pay | Admitting: Family Medicine

## 2023-02-24 ENCOUNTER — Telehealth: Payer: Self-pay | Admitting: Family Medicine

## 2023-02-24 NOTE — Telephone Encounter (Signed)
Forms faxed and placed in scan  

## 2023-02-24 NOTE — Telephone Encounter (Signed)
[facility] admitted to the service today  [phone] Kerrin Mo

## 2023-02-24 NOTE — Telephone Encounter (Signed)
Just a little FYI  [facility] admitted to the service today   [phone] Tyler Hahn

## 2023-02-24 NOTE — Telephone Encounter (Signed)
Signed and returned to [place] on 9/6

## 2023-02-25 ENCOUNTER — Other Ambulatory Visit: Payer: Self-pay | Admitting: Family Medicine

## 2023-02-26 ENCOUNTER — Ambulatory Visit (INDEPENDENT_AMBULATORY_CARE_PROVIDER_SITE_OTHER): Payer: Medicare HMO

## 2023-02-26 ENCOUNTER — Telehealth: Payer: Self-pay | Admitting: Family Medicine

## 2023-02-26 ENCOUNTER — Ambulatory Visit (HOSPITAL_COMMUNITY)
Admission: RE | Admit: 2023-02-26 | Discharge: 2023-02-26 | Disposition: A | Discharge status: Home / Self Care | Payer: Medicare HMO | Source: Ambulatory Visit | Attending: Vascular Surgery | Admitting: Vascular Surgery

## 2023-02-26 DIAGNOSIS — I739 Peripheral vascular disease, unspecified: Secondary | ICD-10-CM | POA: Diagnosis not present

## 2023-02-26 DIAGNOSIS — I428 Other cardiomyopathies: Secondary | ICD-10-CM | POA: Diagnosis not present

## 2023-02-26 LAB — CUP PACEART REMOTE DEVICE CHECK
Battery Remaining Longevity: 150 mo
Battery Remaining Percentage: 100 %
Brady Statistic RA Percent Paced: 0 %
Brady Statistic RV Percent Paced: 3 %
Date Time Interrogation Session: 20240911050100
HighPow Impedance: 40 Ohm
Implantable Lead Connection Status: 753985
Implantable Lead Connection Status: 753985
Implantable Lead Implant Date: 20091218
Implantable Lead Implant Date: 20091218
Implantable Lead Location: 753859
Implantable Lead Location: 753860
Implantable Lead Model: 158
Implantable Lead Serial Number: 131093
Implantable Pulse Generator Implant Date: 20220523
Lead Channel Impedance Value: 338 Ohm
Lead Channel Impedance Value: 429 Ohm
Lead Channel Setting Pacing Amplitude: 2 V
Lead Channel Setting Pacing Amplitude: 2.4 V
Lead Channel Setting Pacing Pulse Width: 0.4 ms
Lead Channel Setting Sensing Sensitivity: 0.5 mV
Pulse Gen Serial Number: 232629

## 2023-02-26 LAB — VAS US ABI WITH/WO TBI
Left ABI: 0
Right ABI: 0

## 2023-02-26 NOTE — Telephone Encounter (Signed)
Home Health Verbal Orders  Agency:  [facility]  Caller: Garner Nash Call back [phone] Fax #:    Requesting Social Work:    Reason for Request:    Frequency:     HH needs F2F w/in last 30 days

## 2023-02-26 NOTE — Telephone Encounter (Signed)
Form signed and returned to [facility] 

## 2023-02-26 NOTE — Telephone Encounter (Signed)
Okay for home health orders

## 2023-02-26 NOTE — Telephone Encounter (Signed)
Forms from [facility] and placed in Dr Beverely Low to be signed folder

## 2023-02-26 NOTE — Telephone Encounter (Signed)
Charge sheet attached. Placed in front bin

## 2023-02-26 NOTE — Telephone Encounter (Signed)
Ok for verbal orders and I will sign forms when available

## 2023-03-03 ENCOUNTER — Telehealth: Payer: Self-pay | Admitting: Family Medicine

## 2023-03-03 DIAGNOSIS — N186 End stage renal disease: Secondary | ICD-10-CM | POA: Diagnosis not present

## 2023-03-03 DIAGNOSIS — Z992 Dependence on renal dialysis: Secondary | ICD-10-CM | POA: Diagnosis not present

## 2023-03-03 DIAGNOSIS — I5022 Chronic systolic (congestive) heart failure: Secondary | ICD-10-CM | POA: Diagnosis not present

## 2023-03-03 DIAGNOSIS — I132 Hypertensive heart and chronic kidney disease with heart failure and with stage 5 chronic kidney disease, or end stage renal disease: Secondary | ICD-10-CM | POA: Diagnosis not present

## 2023-03-03 NOTE — Telephone Encounter (Signed)
Okay for verbal orders.

## 2023-03-03 NOTE — Telephone Encounter (Signed)
Ok for verbal orders ?

## 2023-03-03 NOTE — Progress Notes (Unsigned)
[facility] [place]  ASSESSMENT / PLAN: Tyler Hahn is a 82 y.o. 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  Patient counseled patients with chronic limb threatening ischemia have an annual risk of cardiovascular mortality of 25% and a high risk of amputation.   WIfI score calculated based on clinical exam and non-invasive measurements. 1 / 3 / 2. Stage IV.  Recommend:  Abstinence from all tobacco products. Blood glucose control with goal A1c < 7%. Blood pressure control with goal blood pressure < 140/90 mmHg. Lipid reduction therapy with goal LDL-C <100 mg/dL Aspirin 81mg  PO QD.  Atorvastatin 40-80mg  PO QD (or other "high intensity" statin therapy).  Not able to palpate right femoral pulse. Check CT angiogram with runoff to toes urgently. Will call son with results.   CHIEF COMPLAINT: ischemic ulceration of toes  HISTORY OF PRESENT ILLNESS: Tyler Hahn is a 82 y.o. male referred to clinic for evaluation of ischemic ulceration of the toes.  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  The patient is ambulatory.  He is able to get around his house without much difficulty and enjoys some independence.  He does require assistance with dressing and toileting.  The ulceration is painful to him.  The ulceration has been present for about 2 months.  VASCULAR SURGICAL HISTORY: right arm AVG 08/23/19  VASCULAR RISK FACTORS: Negative history of stroke / transient ischemic attack. Negative history of coronary artery disease.  Positive history of congestive heart failure. NYHA II.  Positive history of diabetes mellitus. Positive history of smoking. Not actively smoking. Negative history of hypertension.  Positive history of chronic kidney disease. ESRD. Negative history of chronic obstructive pulmonary.  FUNCTIONAL STATUS: ECOG performance status: (3) Capable of limited  self-care, confined to bed or chair > 50% of waking hours Ambulatory status: Ambulatory within the community with limits  CAREY 1 AND 3 YEAR INDEX Male (2pts) 75-79 or 80-84 (2pts) >84 (3pts) Dependence in toileting (1pt) Partial or full dependence in dressing (1pt) History of malignant neoplasm (2pts) CHF (3pts) COPD (1pts) CKD (3pts)  0-3 pts 6% 1 year mortality ; 21% 3 year mortality 4-5 pts 12% 1 year mortality ; 36% 3 year mortality >5 pts 21% 1 year mortality; 54% 3 year mortality   Past Medical History:  Diagnosis Date   Anxiety    Automatic implantable cardioverter-defibrillator in situ    greg taylor   CHF (congestive heart failure) (HCC)    2000   Diabetes mellitus    no meds   Fatty liver    Gout    "bout 2-3 months ago"-meds helped.   Hyperlipidemia    Hypertension    Hyperthyroidism    Kidney cysts    PONV (postoperative nausea and vomiting)    Renal insufficiency     Past Surgical History:  Procedure Laterality Date   AV FISTULA PLACEMENT Right 08/23/2019   Procedure: ARTERIOVENOUS GORTEX GRAFT RIGHT ARM;  Surgeon: Larina Earthly, MD;  Location: [facility] OR;  Service: Vascular;  Laterality: Right;   CARDIAC DEFIBRILLATOR PLACEMENT     catract     COLONOSCOPY WITH PROPOFOL N/A 10/03/2015   Procedure: COLONOSCOPY WITH PROPOFOL;  Surgeon: Meryl Dare, MD;  Location: [facility] ENDOSCOPY;  Service: Endoscopy;  Laterality: N/A;   DIAGNOSTIC LAPAROSCOPY  01/27/2014   Dr Luisa Hart   ENTEROSCOPY N/A 11/25/2013   Procedure: ENTEROSCOPY;  Surgeon: Meryl Dare, MD;  Location: [facility] ENDOSCOPY;  Service: Endoscopy;  Laterality: N/A;   ICD GENERATOR CHANGEOUT N/A 11/06/2020   Procedure: ICD GENERATOR CHANGEOUT;  Surgeon: Duke Salvia, MD;  Location: [facility] INVASIVE CV LAB;  Service: Cardiovascular;  Laterality: N/A;   ICD,[place] [employer]     LAPAROSCOPY N/A 01/27/2014   Procedure: LAPAROSCOPY DIAGNOSTIC;  Surgeon: Clovis Pu. Cornett, MD;  Location: [facility] OR;  Service: General;   Laterality: N/A;   polyp removal throat     difficulty speaking    Family History  Problem Relation Age of Onset   Stomach cancer Mother    Alzheimer's disease Mother    Diabetes Father    Heart disease Father    Liver disease Father    Kidney disease Father     Social History   Socioeconomic History   Marital status: Widowed    Spouse name: Not on file   Number of children: 4   Years of education: Not on file   Highest education level: Not on file  Occupational History   Occupation: Retired    Associate Professor: RETIRED  Tobacco Use   Smoking status: Former    Current packs/day: 0.00    Types: Cigarettes    Quit date: 11/26/1998    Years since quitting: 24.2   Smokeless tobacco: Never  Vaping Use   Vaping status: Never Used  Substance and Sexual Activity   Alcohol use: No    Alcohol/week: 0.0 standard drinks of alcohol   Drug use: No   Sexual activity: Not Currently  Other Topics Concern   Not on file  Social History Narrative   Not on file   Social Determinants of Health   Financial Resource Strain: Low Risk  (12/04/2022)   Overall Financial Resource Strain (CARDIA)    Difficulty of Paying Living Expenses: Not hard at all  Food Insecurity: No Food Insecurity (02/06/2023)   Hunger Vital Sign    Worried About Running Out of Food in the Last Year: Never true    Ran Out of Food in the Last Year: Never true  Transportation Needs: No Transportation Needs (02/13/2023)   PRAPARE - Administrator, Civil Service (Medical): No    Lack of Transportation (Non-Medical): No  Physical Activity: Insufficiently Active (12/04/2022)   Exercise Vital Sign    Days of Exercise per Week: 3 days    Minutes of Exercise per Session: 20 min  Stress: No Stress Concern Present (12/04/2022)   [employer] of Occupational Health - Occupational Stress Questionnaire    Feeling of Stress : Not at all  Social Connections: Moderately Integrated (12/04/2022)   Social Connection and  Isolation Panel [NHANES]    Frequency of Communication with Friends and Family: Once a week    Frequency of Social Gatherings with Friends and Family: Twice a week    Attends Religious Services: 1 to 4 times per year    Active Member of [employer] or Organizations: Yes    Attends Banker Meetings: 1 to 4 times per year    Marital Status: Widowed  Intimate Partner Violence: Not At Risk (02/01/2023)   Humiliation, Afraid, Rape, and Kick questionnaire    Fear of Current or Ex-Partner: No    Emotionally Abused: No    Physically Abused: No    Sexually Abused: No    Allergies  Allergen Reactions   Sulfa Antibiotics Itching and Rash   Sulfonamide Derivatives Itching and Rash    Current Outpatient Medications  Medication Sig Dispense Refill  allopurinol (ZYLOPRIM) 100 MG tablet TAKE 1 TABLET BY MOUTH 3 TIMES A WEEK ON MONDAY, WEDNESDAY AND FRIDAY 12 tablet 2   atorvastatin (LIPITOR) 20 MG tablet TAKE 1 TABLET BY MOUTH EVERY DAY 90 tablet 3   clindamycin (CLEOCIN) 300 MG capsule Take 1 capsule (300 mg total) by mouth 3 (three) times daily. 30 capsule 0   donepezil (ARICEPT) 10 MG tablet NEW PRESCRIPTION REQUEST: TAKE ONE TABLET BY MOUTH DAILY 90 tablet 3   doxycycline (VIBRA-TABS) 100 MG tablet Take 1 tablet (100 mg total) by mouth 2 (two) times daily. 14 tablet 0   lactulose (CHRONULAC) 10 GM/15ML solution Take 15 mLs (10 g total) by mouth 2 (two) times daily as needed for mild constipation. 236 mL 0   levothyroxine (SYNTHROID) 50 MCG tablet NEW PRESCRIPTION REQUEST: TAKE ONE TABLET BY MOUTH DAILY 30 tablet 3   loratadine (CLARITIN) 10 MG tablet TAKE 1 TABLET BY MOUTH EVERY DAY 90 tablet 3   Multiple Vitamins-Minerals (MULTIVITAMIN WITH MINERALS) tablet Take 1 tablet by mouth daily.     mupirocin ointment (BACTROBAN) 2 % Apply 1 Application topically 2 (two) times daily. 30 g 2   omeprazole (PRILOSEC) 20 MG capsule NEW PRESCRIPTION REQUEST: TAKE ONE CAPSULE BY MOUTH DAILY 90 capsule  3   sertraline (ZOLOFT) 100 MG tablet TAKE 1.5 TABLETS (150 MG TOTAL) BY MOUTH DAILY 45 tablet 2   sevelamer carbonate (RENVELA) 800 MG tablet Take 800 mg by mouth 3 (three) times daily with meals.     No current facility-administered medications for this visit.    PHYSICAL EXAM There were no vitals filed for this visit.  Elderly man in no distress Right arm arteriovenous graft with thrill Left femoral pulse palpable Right femoral pulse not palpable No palpable pedal pulses Ischemic ulceration of the great toes bilaterally in the nailbed Dorsal ulceration of the left second toe slightly smaller than eraser head Plantar ulceration of the right second toe about the size of an eraser head   PERTINENT LABORATORY AND RADIOLOGIC DATA  Most recent CBC    Latest Ref Rng & Units 02/04/2023    8:29 AM 02/03/2023    9:25 AM 02/02/2023   12:40 AM  CBC  WBC 4.0 - 10.5 K/uL 8.8  8.8  5.3   Hemoglobin 13.0 - 17.0 g/dL 54.0  98.1  19.1   Hematocrit 39.0 - 52.0 % 36.5  44.3  35.1   Platelets 150 - 400 K/uL 109  100  89      Most recent CMP    Latest Ref Rng & Units 02/04/2023    8:23 AM 02/03/2023    9:25 AM 02/02/2023   10:34 AM  CMP  Glucose 70 - 99 mg/dL 478  295  621   BUN 8 - 23 mg/dL 71  49  35   Creatinine 0.61 - 1.24 mg/dL 3.08  6.57  8.46   Sodium 135 - 145 mmol/L 131  133  132   Potassium 3.5 - 5.1 mmol/L 4.9  4.8  4.5   Chloride 98 - 111 mmol/L 92  90  89   CO2 22 - 32 mmol/L 20  21  24    Calcium 8.9 - 10.3 mg/dL 8.8  8.9  9.2     Renal function CrCl cannot be calculated (Patient's most recent lab result is older than the maximum 21 days allowed.).  Hgb A1c MFr Bld (%)  Date Value  01/21/2023 7.1 (H)    LDL Cholesterol (Calc)  Date Value Ref Range Status  07/04/2017 83 mg/dL (calc) Final    Comment:    Reference range: <100 . Desirable range <100 mg/dL for primary prevention;   <70 mg/dL for patients with CHD or diabetic patients  with > or = 2 CHD risk  factors. Marland Kitchen LDL-C is now calculated using the Martin-Hopkins  calculation, which is a validated novel method providing  better accuracy than the Friedewald equation in the  estimation of LDL-C.  Horald Pollen et al. Lenox Ahr. 4540;981(19): 2061-2068  (http://education.[facility].com/faq/FAQ164)    LDL Cholesterol  Date Value Ref Range Status  02/25/2022 66 0 - 99 mg/dL Final   Direct LDL  Date Value Ref Range Status  08/03/2021 94.0 mg/dL Final    Comment:    Optimal:  <100 mg/dLNear or Above Optimal:  100-129 mg/dLBorderline High:  130-159 mg/dLHigh:  160-189 mg/dLVery High:  >190 mg/dL     +-------+-----------+-----------+------------+------------+  ABI/TBIToday's ABIToday's TBIPrevious ABIPrevious TBI  +-------+-----------+-----------+------------+------------+  Right 0          0                                    +-------+-----------+-----------+------------+------------+  Left  0          0                                    +-------+-----------+-----------+------------+------------+   Rande Brunt. Lenell Antu, MD FACS [facility] Phone Number: [phone] 03/03/2023 10:07 AM   Total time spent on preparing this encounter including chart review, data review, collecting history, examining the patient, coordinating care for this new patient, 60 minutes.  Portions of this report may have been transcribed using voice recognition software.  Every effort has been made to ensure accuracy; however, inadvertent computerized transcription errors may still be present.

## 2023-03-03 NOTE — Telephone Encounter (Signed)
April has been notified

## 2023-03-03 NOTE — Telephone Encounter (Signed)
Home Health Verbal Orders  Agency: [facility]  Caller:April  Call back #: [phone] Fax #:    HH : 1wk4

## 2023-03-04 ENCOUNTER — Encounter: Payer: Self-pay | Admitting: Vascular Surgery

## 2023-03-04 ENCOUNTER — Ambulatory Visit: Payer: Medicare HMO | Admitting: Vascular Surgery

## 2023-03-04 ENCOUNTER — Other Ambulatory Visit: Payer: Self-pay

## 2023-03-04 ENCOUNTER — Telehealth: Payer: Self-pay | Admitting: Family Medicine

## 2023-03-04 VITALS — BP 110/71 | HR 68 | Temp 97.7°F | Resp 20 | Ht 67.0 in | Wt 166.0 lb

## 2023-03-04 DIAGNOSIS — I70235 Atherosclerosis of native arteries of right leg with ulceration of other part of foot: Secondary | ICD-10-CM

## 2023-03-04 DIAGNOSIS — I70245 Atherosclerosis of native arteries of left leg with ulceration of other part of foot: Secondary | ICD-10-CM

## 2023-03-04 NOTE — Telephone Encounter (Signed)
Ok to give verbal orders?

## 2023-03-04 NOTE — Telephone Encounter (Signed)
Home Health Verbal Orders  Agency:  [facility]  Caller: April Stegall Call back #: [phone] Fax #:    Requesting Skilled nursing eff: 03/16/23 1wk1   HH eff: 03/02/23 1wk3

## 2023-03-04 NOTE — Telephone Encounter (Signed)
Varbal orders provided, no concerns

## 2023-03-06 ENCOUNTER — Ambulatory Visit: Payer: Medicare HMO | Admitting: Podiatry

## 2023-03-07 ENCOUNTER — Ambulatory Visit: Payer: Medicare HMO | Admitting: Podiatry

## 2023-03-07 ENCOUNTER — Ambulatory Visit (HOSPITAL_COMMUNITY)
Admission: RE | Admit: 2023-03-07 | Discharge: 2023-03-07 | Disposition: A | Discharge status: Home / Self Care | Payer: Medicare HMO | Source: Ambulatory Visit | Attending: Vascular Surgery | Admitting: Vascular Surgery

## 2023-03-07 DIAGNOSIS — I70245 Atherosclerosis of native arteries of left leg with ulceration of other part of foot: Secondary | ICD-10-CM | POA: Diagnosis not present

## 2023-03-07 DIAGNOSIS — I70211 Atherosclerosis of native arteries of extremities with intermittent claudication, right leg: Secondary | ICD-10-CM | POA: Diagnosis not present

## 2023-03-07 DIAGNOSIS — I70235 Atherosclerosis of native arteries of right leg with ulceration of other part of foot: Secondary | ICD-10-CM | POA: Insufficient documentation

## 2023-03-07 DIAGNOSIS — R600 Localized edema: Secondary | ICD-10-CM | POA: Diagnosis not present

## 2023-03-07 DIAGNOSIS — K5730 Diverticulosis of large intestine without perforation or abscess without bleeding: Secondary | ICD-10-CM | POA: Diagnosis not present

## 2023-03-07 DIAGNOSIS — N261 Atrophy of kidney (terminal): Secondary | ICD-10-CM | POA: Diagnosis not present

## 2023-03-07 MED ORDER — IOHEXOL 350 MG/ML SOLN
100.0000 mL | Freq: Once | INTRAVENOUS | Status: AC | PRN
  Administered 2023-03-07: 100 mL via INTRAVENOUS

## 2023-03-10 ENCOUNTER — Telehealth: Payer: Self-pay | Admitting: Family Medicine

## 2023-03-10 NOTE — Telephone Encounter (Signed)
[facility] would like to Report pain in right foot and most of time is a 3. Could be as low as zero and as high as 10. Wound on right foot. Patient has no medicine for pain, they would like to know what options could be. Patient is having difficulty with sleep started using melatonin 2 of 3mg . Is that ok?  Other than the listed problems patient is ok. Please call [facility] at [phone]

## 2023-03-13 ENCOUNTER — Ambulatory Visit: Payer: Self-pay

## 2023-03-13 NOTE — Patient Outreach (Signed)
Care Coordination   Follow Up Visit Note   03/13/2023 Name: Tyler Hahn MRN: [id number] DOB: [birth year]  Tyler Hahn is a 82 y.o. year old male who sees Tabori, Helane Rima, MD for primary care. I  spoke with son Tyler Hahn by phone today.  What matters to the patients health and wellness today?  Finding out treatement related to leg circulation    Goals Addressed             This Visit's Progress    Get Strength Back       Patient Goals/Self Care Activities: -Patient/Caregiver will take medications as prescribed   -Patient/Caregiver will attend all scheduled provider appointments -Patient/Caregiver will call provider office for new concerns or questions     Care Coordination Interventions: Assessed the Designated Party Release Tyler Hahn understanding of chronic kidney disease    Evaluation of current treatment plan related to chronic kidney disease self management and patient's adherence to plan as established by provider       Spoke with son Tyler Hahn. He reports that things are going well for his Dad.  He feels he is stronger.  He is active with [facility].  There is some concern about circulation as patient has wound to right 2nd toe and left great toe.  Test have been done waiting for discussion with vascular about options.  CT scan done to rule out osteomyelitis.  Discussed signs and symptoms of infection to wounds and notifying physician for any changes.   No concerns.         SDOH assessments and interventions completed:  Yes     Care Coordination Interventions:  Yes, provided   Follow up plan: Follow up call scheduled for October    Encounter Outcome:  Patient Visit Completed   Bary Leriche, RN, MSN [place]  [facility], [facility] Management Community Coordinator Direct Dial: [phone]  Fax: [phone] Website: [email or web address]

## 2023-03-13 NOTE — Patient Instructions (Signed)
Visit Information  Thank you for taking time to visit with me today. Please don't hesitate to contact me if I can be of assistance to you.   Following are the goals we discussed today:   Goals Addressed             This Visit's Progress    Get Strength Back       Patient Goals/Self Care Activities: -Patient/Caregiver will take medications as prescribed   -Patient/Caregiver will attend all scheduled provider appointments -Patient/Caregiver will call provider office for new concerns or questions     Care Coordination Interventions: Assessed the Designated Party Release Christiane Ha understanding of chronic kidney disease    Evaluation of current treatment plan related to chronic kidney disease self management and patient's adherence to plan as established by provider       Spoke with son Christiane Ha. He reports that things are going well for his Dad.  He feels he is stronger.  He is active with [facility].  There is some concern about circulation as patient has wound to right 2nd toe and left great toe.  Test have been done waiting for discussion with vascular about options.  CT scan done to rule out osteomyelitis.  Discussed signs and symptoms of infection to wounds and notifying physician for any changes.   No concerns.         Our next appointment is by telephone on 04/10/23 at 1:30 pm  Please call the care guide team at [phone] if you need to cancel or reschedule your appointment.   If you are experiencing a Mental Health or Behavioral Health Crisis or need someone to talk to, please call the Suicide and Crisis Lifeline: 988   Patient verbalizes understanding of instructions and care plan provided today and agrees to view in MyChart. Active MyChart status and patient understanding of how to access instructions and care plan via MyChart confirmed with patient.     The patient has been provided with contact information for the care management team and has been advised  to call with any health related questions or concerns.   Bary Leriche, RN, MSN [facility], [facility] Management Community Coordinator Direct Dial: [phone]  Fax: [phone] Website: [email or web address]

## 2023-03-17 ENCOUNTER — Ambulatory Visit: Payer: Medicare HMO | Admitting: Podiatry

## 2023-03-17 DIAGNOSIS — N186 End stage renal disease: Secondary | ICD-10-CM | POA: Diagnosis not present

## 2023-03-17 DIAGNOSIS — Z992 Dependence on renal dialysis: Secondary | ICD-10-CM | POA: Diagnosis not present

## 2023-03-17 DIAGNOSIS — I129 Hypertensive chronic kidney disease with stage 1 through stage 4 chronic kidney disease, or unspecified chronic kidney disease: Secondary | ICD-10-CM | POA: Diagnosis not present

## 2023-03-17 NOTE — Progress Notes (Signed)
Remote ICD transmission.   

## 2023-03-17 NOTE — Progress Notes (Unsigned)
[facility] OF [place]  ASSESSMENT / PLAN: Tyler Hahn is a 82 y.o. 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  Patient counseled patients with chronic limb threatening ischemia have an annual risk of cardiovascular mortality of 25% and a high risk of amputation.   WIfI score calculated based on clinical exam and non-invasive measurements. 1 / 3 / 2. Stage IV.  Recommend:  Abstinence from all tobacco products. Blood glucose control with goal A1c < 7%. Blood pressure control with goal blood pressure < 140/90 mmHg. Lipid reduction therapy with goal LDL-C <100 mg/dL Aspirin 81mg  PO QD.  Atorvastatin 40-80mg  PO QD (or other "high intensity" statin therapy).  Not able to palpate right femoral pulse. Check CT angiogram with runoff to toes urgently. Will call son with results.   CHIEF COMPLAINT: ischemic ulceration of toes  HISTORY OF PRESENT ILLNESS: Tyler Hahn is a 82 y.o. male referred to clinic for evaluation of ischemic ulceration of the toes.  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  The patient is ambulatory.  He is able to get around his house without much difficulty and enjoys some independence.  He does require assistance with dressing and toileting.  The ulceration is painful to him.  The ulceration has been present for about 2 months.  VASCULAR SURGICAL HISTORY: right arm AVG 08/23/19  VASCULAR RISK FACTORS: Negative history of stroke / transient ischemic attack. Negative history of coronary artery disease.  Positive history of congestive heart failure. NYHA II.  Positive history of diabetes mellitus. Positive history of smoking. Not actively smoking. Negative history of hypertension.  Positive history of chronic kidney disease. ESRD. Negative history of chronic obstructive pulmonary.  FUNCTIONAL STATUS: ECOG performance status: (3) Capable of limited  self-care, confined to bed or chair > 50% of waking hours Ambulatory status: Ambulatory within the community with limits  CAREY 1 AND 3 YEAR INDEX Male (2pts) 75-79 or 80-84 (2pts) >84 (3pts) Dependence in toileting (1pt) Partial or full dependence in dressing (1pt) History of malignant neoplasm (2pts) CHF (3pts) COPD (1pts) CKD (3pts)  0-3 pts 6% 1 year mortality ; 21% 3 year mortality 4-5 pts 12% 1 year mortality ; 36% 3 year mortality >5 pts 21% 1 year mortality; 54% 3 year mortality   Past Medical History:  Diagnosis Date   Anxiety    Automatic implantable cardioverter-defibrillator in situ    greg taylor   CHF (congestive heart failure) (HCC)    2000   Diabetes mellitus    no meds   Fatty liver    Gout    "bout 2-3 months ago"-meds helped.   Hyperlipidemia    Hypertension    Hyperthyroidism    Kidney cysts    PONV (postoperative nausea and vomiting)    Renal insufficiency     Past Surgical History:  Procedure Laterality Date   AV FISTULA PLACEMENT Right 08/23/2019   Procedure: ARTERIOVENOUS GORTEX GRAFT RIGHT ARM;  Surgeon: Larina Earthly, MD;  Location: [facility] OR;  Service: Vascular;  Laterality: Right;   CARDIAC DEFIBRILLATOR PLACEMENT     catract     COLONOSCOPY WITH PROPOFOL N/A 10/03/2015   Procedure: COLONOSCOPY WITH PROPOFOL;  Surgeon: Meryl Dare, MD;  Location: [facility];  Service: Endoscopy;  Laterality: N/A;   DIAGNOSTIC LAPAROSCOPY  01/27/2014   Dr Luisa Hart   ENTEROSCOPY N/A 11/25/2013   Procedure: ENTEROSCOPY;  Surgeon: Meryl Dare, MD;  Location: [facility];  Service: Endoscopy;  Laterality: N/A;   ICD GENERATOR CHANGEOUT N/A 11/06/2020   Procedure: ICD GENERATOR CHANGEOUT;  Surgeon: Duke Salvia, MD;  Location: [facility] INVASIVE CV LAB;  Service: Cardiovascular;  Laterality: N/A;   ICD,[place] [employer]     LAPAROSCOPY N/A 01/27/2014   Procedure: LAPAROSCOPY DIAGNOSTIC;  Surgeon: Clovis Pu. Cornett, MD;  Location: [facility] OR;  Service: General;   Laterality: N/A;   polyp removal throat     difficulty speaking    Family History  Problem Relation Age of Onset   Stomach cancer Mother    Alzheimer's disease Mother    Diabetes Father    Heart disease Father    Liver disease Father    Kidney disease Father     Social History   Socioeconomic History   Marital status: Widowed    Spouse name: Not on file   Number of children: 4   Years of education: Not on file   Highest education level: Not on file  Occupational History   Occupation: Retired    Associate Professor: RETIRED  Tobacco Use   Smoking status: Former    Current packs/day: 0.00    Types: Cigarettes    Quit date: 11/26/1998    Years since quitting: 24.3   Smokeless tobacco: Never  Vaping Use   Vaping status: Never Used  Substance and Sexual Activity   Alcohol use: No    Alcohol/week: 0.0 standard drinks of alcohol   Drug use: No   Sexual activity: Not Currently  Other Topics Concern   Not on file  Social History Narrative   Not on file   Social Determinants of Health   Financial Resource Strain: Low Risk  (12/04/2022)   Overall Financial Resource Strain (CARDIA)    Difficulty of Paying Living Expenses: Not hard at all  Food Insecurity: No Food Insecurity (02/06/2023)   Hunger Vital Sign    Worried About Running Out of Food in the Last Year: Never true    Ran Out of Food in the Last Year: Never true  Transportation Needs: No Transportation Needs (02/13/2023)   PRAPARE - Administrator, Civil Service (Medical): No    Lack of Transportation (Non-Medical): No  Physical Activity: Insufficiently Active (12/04/2022)   Exercise Vital Sign    Days of Exercise per Week: 3 days    Minutes of Exercise per Session: 20 min  Stress: No Stress Concern Present (12/04/2022)   [employer] of Occupational Health - Occupational Stress Questionnaire    Feeling of Stress : Not at all  Social Connections: Moderately Integrated (12/04/2022)   Social Connection and  Isolation Panel [NHANES]    Frequency of Communication with Friends and Family: Once a week    Frequency of Social Gatherings with Friends and Family: Twice a week    Attends Religious Services: 1 to 4 times per year    Active Member of [employer] or Organizations: Yes    Attends Banker Meetings: 1 to 4 times per year    Marital Status: Widowed  Intimate Partner Violence: Not At Risk (02/01/2023)   Humiliation, Afraid, Rape, and Kick questionnaire    Fear of Current or Ex-Partner: No    Emotionally Abused: No    Physically Abused: No    Sexually Abused: No    Allergies  Allergen Reactions   Sulfa Antibiotics Itching and Rash   Sulfonamide Derivatives Itching and Rash    Current Outpatient Medications  Medication Sig Dispense Refill  allopurinol (ZYLOPRIM) 100 MG tablet TAKE 1 TABLET BY MOUTH 3 TIMES A WEEK ON MONDAY, WEDNESDAY AND FRIDAY 12 tablet 2   atorvastatin (LIPITOR) 20 MG tablet TAKE 1 TABLET BY MOUTH EVERY DAY 90 tablet 3   clindamycin (CLEOCIN) 300 MG capsule Take 1 capsule (300 mg total) by mouth 3 (three) times daily. 30 capsule 0   donepezil (ARICEPT) 10 MG tablet NEW PRESCRIPTION REQUEST: TAKE ONE TABLET BY MOUTH DAILY 90 tablet 3   doxycycline (VIBRA-TABS) 100 MG tablet Take 1 tablet (100 mg total) by mouth 2 (two) times daily. 14 tablet 0   lactulose (CHRONULAC) 10 GM/15ML solution Take 15 mLs (10 g total) by mouth 2 (two) times daily as needed for mild constipation. 236 mL 0   levothyroxine (SYNTHROID) 50 MCG tablet NEW PRESCRIPTION REQUEST: TAKE ONE TABLET BY MOUTH DAILY 30 tablet 3   loratadine (CLARITIN) 10 MG tablet TAKE 1 TABLET BY MOUTH EVERY DAY 90 tablet 3   Multiple Vitamins-Minerals (MULTIVITAMIN WITH MINERALS) tablet Take 1 tablet by mouth daily.     mupirocin ointment (BACTROBAN) 2 % Apply 1 Application topically 2 (two) times daily. 30 g 2   omeprazole (PRILOSEC) 20 MG capsule NEW PRESCRIPTION REQUEST: TAKE ONE CAPSULE BY MOUTH DAILY 90 capsule  3   sertraline (ZOLOFT) 100 MG tablet TAKE 1.5 TABLETS (150 MG TOTAL) BY MOUTH DAILY 45 tablet 2   sevelamer carbonate (RENVELA) 800 MG tablet Take 800 mg by mouth 3 (three) times daily with meals.     No current facility-administered medications for this visit.    PHYSICAL EXAM There were no vitals filed for this visit.  Elderly man in no distress Right arm arteriovenous graft with thrill Left femoral pulse palpable Right femoral pulse not palpable No palpable pedal pulses Ischemic ulceration of the great toes bilaterally in the nailbed Dorsal ulceration of the left second toe slightly smaller than eraser head Plantar ulceration of the right second toe about the size of an eraser head   PERTINENT LABORATORY AND RADIOLOGIC DATA  Most recent CBC    Latest Ref Rng & Units 02/04/2023    8:29 AM 02/03/2023    9:25 AM 02/02/2023   12:40 AM  CBC  WBC 4.0 - 10.5 K/uL 8.8  8.8  5.3   Hemoglobin 13.0 - 17.0 g/dL 09.8  11.9  14.7   Hematocrit 39.0 - 52.0 % 36.5  44.3  35.1   Platelets 150 - 400 K/uL 109  100  89      Most recent CMP    Latest Ref Rng & Units 02/04/2023    8:23 AM 02/03/2023    9:25 AM 02/02/2023   10:34 AM  CMP  Glucose 70 - 99 mg/dL 829  562  130   BUN 8 - 23 mg/dL 71  49  35   Creatinine 0.61 - 1.24 mg/dL 8.65  7.84  6.96   Sodium 135 - 145 mmol/L 131  133  132   Potassium 3.5 - 5.1 mmol/L 4.9  4.8  4.5   Chloride 98 - 111 mmol/L 92  90  89   CO2 22 - 32 mmol/L 20  21  24    Calcium 8.9 - 10.3 mg/dL 8.8  8.9  9.2     Renal function CrCl cannot be calculated (Patient's most recent lab result is older than the maximum 21 days allowed.).  Hgb A1c MFr Bld (%)  Date Value  01/21/2023 7.1 (H)    LDL Cholesterol (Calc)  Date Value Ref Range Status  07/04/2017 83 mg/dL (calc) Final    Comment:    Reference range: <100 . Desirable range <100 mg/dL for primary prevention;   <70 mg/dL for patients with CHD or diabetic patients  with > or = 2 CHD risk  factors. Marland Kitchen LDL-C is now calculated using the Martin-Hopkins  calculation, which is a validated novel method providing  better accuracy than the Friedewald equation in the  estimation of LDL-C.  Horald Pollen et al. Lenox Ahr. 2956;213(08): 2061-2068  (http://education.[facility].com/faq/FAQ164)    LDL Cholesterol  Date Value Ref Range Status  02/25/2022 66 0 - 99 mg/dL Final   Direct LDL  Date Value Ref Range Status  08/03/2021 94.0 mg/dL Final    Comment:    Optimal:  <100 mg/dLNear or Above Optimal:  100-129 mg/dLBorderline High:  130-159 mg/dLHigh:  160-189 mg/dLVery High:  >190 mg/dL     +-------+-----------+-----------+------------+------------+  ABI/TBIToday's ABIToday's TBIPrevious ABIPrevious TBI  +-------+-----------+-----------+------------+------------+  Right 0          0                                    +-------+-----------+-----------+------------+------------+  Left  0          0                                    +-------+-----------+-----------+------------+------------+   Rande Brunt. Lenell Antu, MD FACS [facility] Phone Number: [phone] 03/17/2023 8:02 PM   Total time spent on preparing this encounter including chart review, data review, collecting history, examining the patient, coordinating care for this new patient, 60 minutes.  Portions of this report may have been transcribed using voice recognition software.  Every effort has been made to ensure accuracy; however, inadvertent computerized transcription errors may still be present.

## 2023-03-18 ENCOUNTER — Ambulatory Visit (INDEPENDENT_AMBULATORY_CARE_PROVIDER_SITE_OTHER): Payer: Self-pay | Admitting: Vascular Surgery

## 2023-03-18 ENCOUNTER — Telehealth: Payer: Self-pay | Admitting: Family Medicine

## 2023-03-18 DIAGNOSIS — I70245 Atherosclerosis of native arteries of left leg with ulceration of other part of foot: Secondary | ICD-10-CM

## 2023-03-18 DIAGNOSIS — I70235 Atherosclerosis of native arteries of right leg with ulceration of other part of foot: Secondary | ICD-10-CM

## 2023-03-18 NOTE — Telephone Encounter (Signed)
Placed in your sign folder at nurse station.

## 2023-03-18 NOTE — Telephone Encounter (Signed)
Home Health Verbal Orders  Agency:  [facility]  Caller:April Stegall Call back #: [phone] Fax #:[phone]    Charge sheet attached and placed in front bin

## 2023-03-19 NOTE — Telephone Encounter (Signed)
Form completed and returned to [employer]

## 2023-03-20 ENCOUNTER — Encounter: Payer: Self-pay | Admitting: Podiatry

## 2023-03-20 ENCOUNTER — Ambulatory Visit (INDEPENDENT_AMBULATORY_CARE_PROVIDER_SITE_OTHER): Payer: Medicare HMO

## 2023-03-20 ENCOUNTER — Ambulatory Visit: Payer: Medicare HMO | Admitting: Podiatry

## 2023-03-20 VITALS — BP 136/64 | HR 62 | Temp 97.2°F | Resp 18 | Ht 67.0 in | Wt 167.0 lb

## 2023-03-20 DIAGNOSIS — L97514 Non-pressure chronic ulcer of other part of right foot with necrosis of bone: Secondary | ICD-10-CM | POA: Diagnosis not present

## 2023-03-20 DIAGNOSIS — M869 Osteomyelitis, unspecified: Secondary | ICD-10-CM | POA: Diagnosis not present

## 2023-03-20 DIAGNOSIS — L97522 Non-pressure chronic ulcer of other part of left foot with fat layer exposed: Secondary | ICD-10-CM

## 2023-03-20 DIAGNOSIS — I739 Peripheral vascular disease, unspecified: Secondary | ICD-10-CM

## 2023-03-20 MED ORDER — CLINDAMYCIN HCL 300 MG PO CAPS: 300.0000 mg | ORAL_CAPSULE | Freq: Three times a day (TID) | ORAL | 0 refills | Status: DC

## 2023-03-23 NOTE — Progress Notes (Signed)
Subjective:   Patient ID: Tyler Hahn, male   DOB: 82 y.o.   MRN: [id number]   HPI Chief Complaint  Patient presents with   Foot Pain    Patient is here for a 2 Week F/U     82 year old male presents the office today for follow-up evaluation of ulceration to the right second toe.  Also states that the right big toenail started to come off.  He has follow-up with vascular surgery.  Awaiting to be scheduled for intervention.  He does not report any fevers or chills and overall stable.  He has not had any new concerns.  He presents today with family members.     Objective:  Physical Exam  General: AAO x3, NAD  Dermatological: Ulceration still noted at the distal aspect the right second toe.  The right hallux toenail has come off and there is a wound present to this area as well.  Nail of the left foot is also gone and there is an ulcer on the left hallux.  This is along the nailbed.  Some fibrotic tissue was noted on the nailbed and the left side.  There is no probing to bone on any of the ulcers.  There is still some minimal edema mostly to the right second toe.  There is no fluctuation or crepitation.  There is no malodor.  Vascular: Unable to palpate pulse.  Neruologic: Decreased.  Musculoskeletal: Hammertoes present.     Assessment:   Ulceration, osteomyelitis right second toe; left hallux toenail avulsion     Plan:  -Treatment options discussed including all alternatives, risks, and complications -Etiology of symptoms were discussed -X-rays were obtained reviewed.  Unfortunately the toes have been cut off on the AP view on the right foot before I can see there is no definitive cortical changes at this time to suggest osteomyelitis to the hallux but still concerned about the distal aspect of the second toe. -Plan to continue antibiotics.  Refill, clindamycin.  Tolerated this well previously. -Surgical shoe for offloading -Continue daily dressing changes -Unfortunately very  high risk of amputation.  Hopefully her circulation can improve and we can get these wounds to heal.  He and his family are aware that he is at high risk. -Monitor for any clinical signs or symptoms of infection and directed to call the office immediately should any occur or go to the ER.  Return for toe ulcer in 2-3 weeks, bilateral x-ray.  Vivi Barrack DPM

## 2023-03-24 ENCOUNTER — Other Ambulatory Visit: Payer: Self-pay

## 2023-03-24 DIAGNOSIS — I70235 Atherosclerosis of native arteries of right leg with ulceration of other part of foot: Secondary | ICD-10-CM

## 2023-03-24 DIAGNOSIS — I739 Peripheral vascular disease, unspecified: Secondary | ICD-10-CM

## 2023-03-24 MED ORDER — SODIUM CHLORIDE 0.9 % IV SOLN: 250.0000 mL | INTRAVENOUS | Status: DC | PRN

## 2023-03-24 MED ORDER — SODIUM CHLORIDE 0.9% FLUSH: 3.0000 mL | Freq: Two times a day (BID) | INTRAVENOUS | Status: DC

## 2023-03-24 MED ORDER — SODIUM CHLORIDE 0.9% FLUSH: 3.0000 mL | INTRAVENOUS | Status: DC | PRN

## 2023-03-26 ENCOUNTER — Telehealth: Payer: Self-pay | Admitting: Family Medicine

## 2023-03-26 NOTE — Telephone Encounter (Signed)
Patient of Dr. Beverely Low, message forwarded.

## 2023-03-26 NOTE — Telephone Encounter (Signed)
Does pt need another evaluation in the office given continued issues

## 2023-03-26 NOTE — Telephone Encounter (Signed)
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

## 2023-03-27 NOTE — Telephone Encounter (Signed)
If pt and family are not participating in exercises, no need to extend visits.  Nothing further to do at this time

## 2023-03-27 NOTE — Telephone Encounter (Signed)
Called and spoke with Rosanne Ashing from [facility]. He stated that PT has been seeing patient for a week and taught patient things and it is not being done by patient and/or family. He stated that as far as extending you will need to call and speak with his manager Jenna at [phone]. Rosanne Ashing also mentioned that patient is due to have a vascular procedure coming up.

## 2023-03-27 NOTE — Telephone Encounter (Signed)
Are we able to extend his home health visits?  If he were to develop cough, sputum production, or worsening shortness of breath would recommend appt.  Based on what is described, no need to stress him by coming for an appt at this time

## 2023-04-03 DIAGNOSIS — Z992 Dependence on renal dialysis: Secondary | ICD-10-CM | POA: Diagnosis not present

## 2023-04-03 DIAGNOSIS — I129 Hypertensive chronic kidney disease with stage 1 through stage 4 chronic kidney disease, or unspecified chronic kidney disease: Secondary | ICD-10-CM | POA: Diagnosis not present

## 2023-04-03 DIAGNOSIS — N186 End stage renal disease: Secondary | ICD-10-CM | POA: Diagnosis not present

## 2023-04-04 ENCOUNTER — Ambulatory Visit (HOSPITAL_COMMUNITY): Admission: RE | Admit: 2023-04-04 | Payer: Medicare HMO | Source: Home / Self Care | Admitting: Vascular Surgery

## 2023-04-04 ENCOUNTER — Encounter (HOSPITAL_COMMUNITY): Admission: RE | Payer: Self-pay | Source: Home / Self Care

## 2023-04-04 SURGERY — ABDOMINAL AORTOGRAM W/LOWER EXTREMITY
Anesthesia: LOCAL

## 2023-04-10 ENCOUNTER — Ambulatory Visit: Payer: Self-pay

## 2023-04-10 ENCOUNTER — Telehealth: Payer: Self-pay | Admitting: Family Medicine

## 2023-04-10 ENCOUNTER — Ambulatory Visit: Payer: Medicare HMO | Admitting: Podiatry

## 2023-04-10 NOTE — Telephone Encounter (Signed)
[employer] [employer] calling about Death certificate update. Can be reached at [phone]

## 2023-04-10 NOTE — Telephone Encounter (Signed)
Death certificate completed.

## 2023-04-10 NOTE — Patient Outreach (Signed)
Care Coordination   Follow Up Visit Note   04/10/2023 Name: TAYSHAUN PARZIALE MRN: [id number] DOB: December 05, 1940  KACESON BENALLY is a 82 y.o. year old male who sees Tabori, Helane Rima, MD for primary care. I spoke with  Durwin Nora by phone today.  He states patient pasted away on Apr 20, 2023.  Funeral is Saturday 10/26.  Expressed condolences for the family.  No concerns expressed  What matters to the patients health and wellness today?  N/A      SDOH assessments and interventions completed:  No     Care Coordination Interventions:  No, not indicated   Follow up plan: No further intervention required.   Encounter Outcome:  Patient Visit Completed   Bary Leriche, RN, MSN [facility]  [facility], [facility] Management Community Coordinator Direct Dial: [phone]  Fax: [phone] Website: [email or web address]

## 2023-04-11 NOTE — Telephone Encounter (Signed)
has been notified

## 2023-04-18 DEATH — deceased

## 2023-04-30 ENCOUNTER — Telehealth: Payer: Self-pay | Admitting: Family Medicine

## 2023-04-30 NOTE — Telephone Encounter (Signed)
Papers were found in one of Dr.Tabori folder.

## 2023-04-30 NOTE — Telephone Encounter (Deleted)
Placed in your sign folder  

## 2023-04-30 NOTE — Telephone Encounter (Signed)
Caller name: [facility]   Provider they see: Sheliah Hatch, MD  Chart Notes for chart  Placed in front bin

## 2023-04-30 NOTE — Telephone Encounter (Signed)
Forms are not in bin. Made front desk staff aware.

## 2023-04-30 NOTE — Telephone Encounter (Signed)
Forms reviewed.  No action needed

## 2023-12-14 IMAGING — RF DG SWALLOWING FUNCTION
12 of 22 series · 12 of 24 positions shown · non-contrast
Comparison: None Available.

CLINICAL DATA: Dysphagia. Coughing, choking, and vomiting with
eating or sleeping.

EXAM:
MODIFIED BARIUM SWALLOW
TECHNIQUE: Different consistencies of barium were administered orally to the
patient by the Speech Pathologist. Imaging of the pharynx was
performed in the lateral projection. The radiologist was present in
the fluoroscopy room for this study, providing personal supervision.
FLUOROSCOPY:
Radiation Exposure Index (as provided by the fluoroscopic device):
13.6 mGy Kerma

[Series 2: run · 1 of 20 frames shown (1 of 12)]
[frame 1/20]
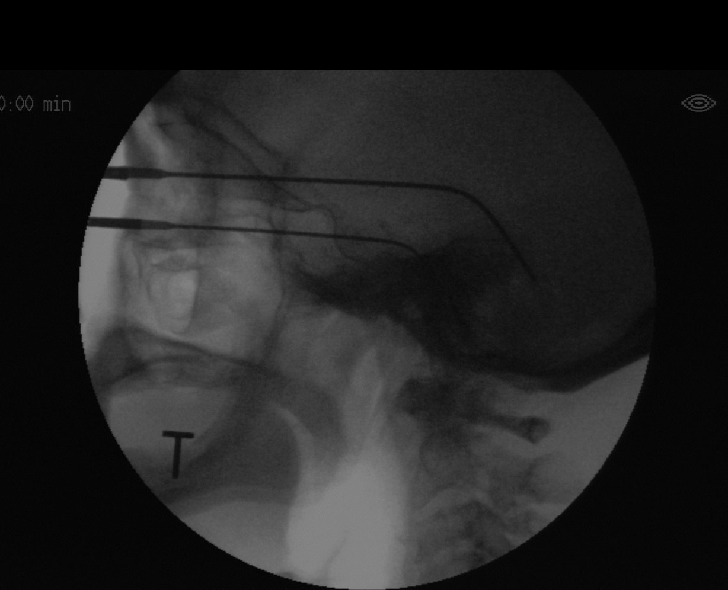

[Series 3: run · 1 of 35 frames shown (2 of 12)]
[frame 30/35]
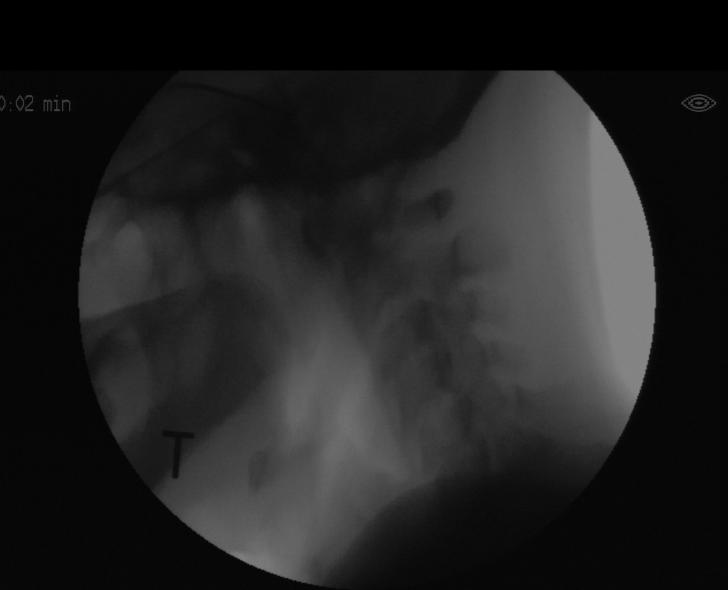

[Series 5: run · 1 of 76 frames shown (3 of 12)]
[frame 65/76]
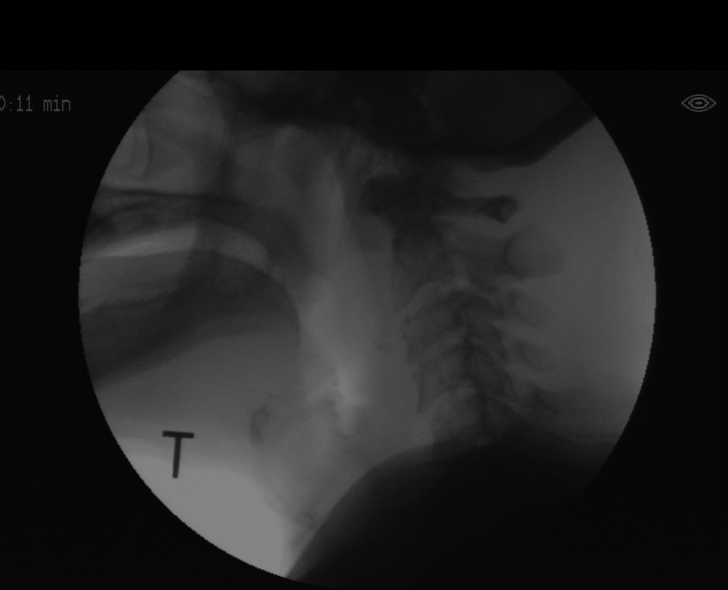

[Series 7: run · 1 of 165 frames shown (4 of 12)]
[frame 99/165]
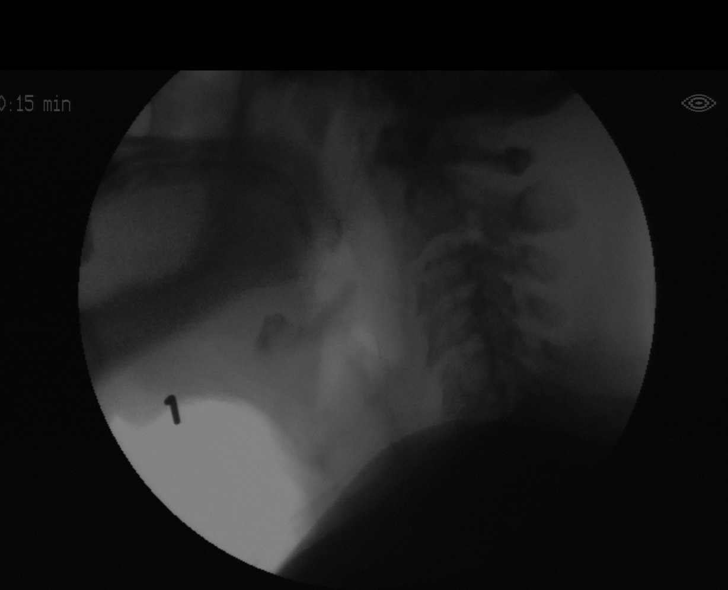

[Series 9: run · 1 of 93 frames shown (5 of 12)]
[frame 80/93]
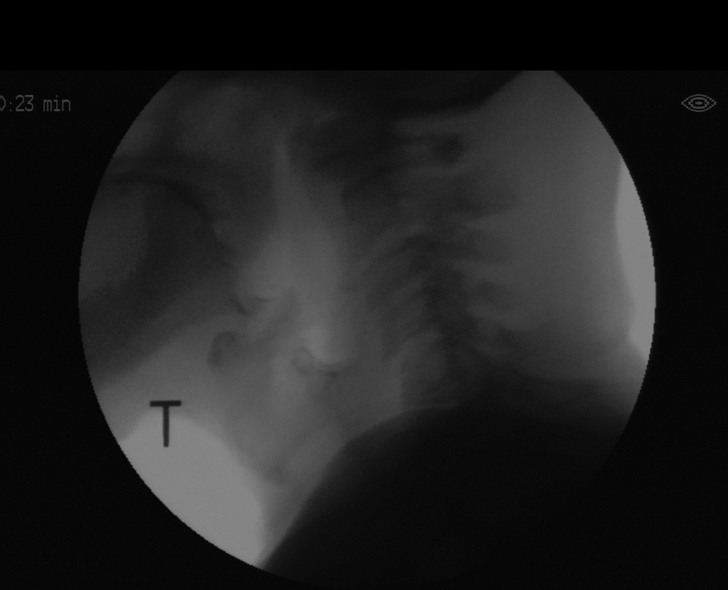

[Series 11: run · 1 of 132 frames shown (6 of 12)]
[frame 69/132]
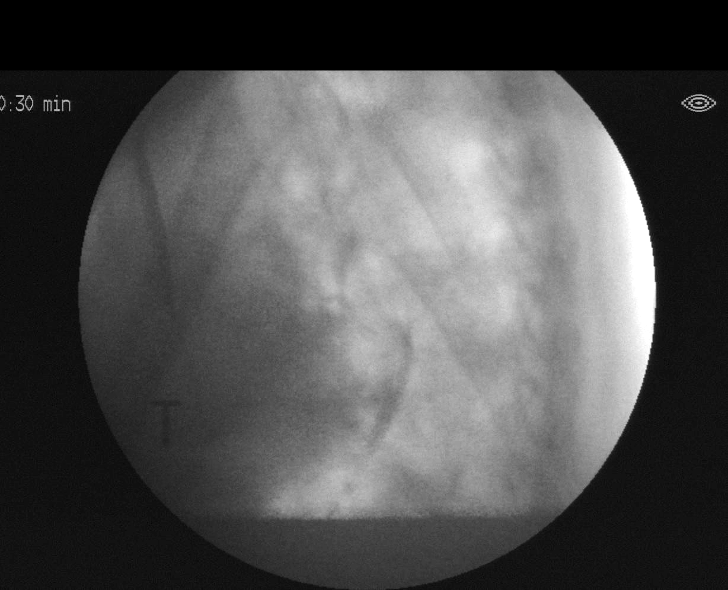

[Series 13: run · 1 of 22 frames shown (7 of 12)]
[frame 12/22]
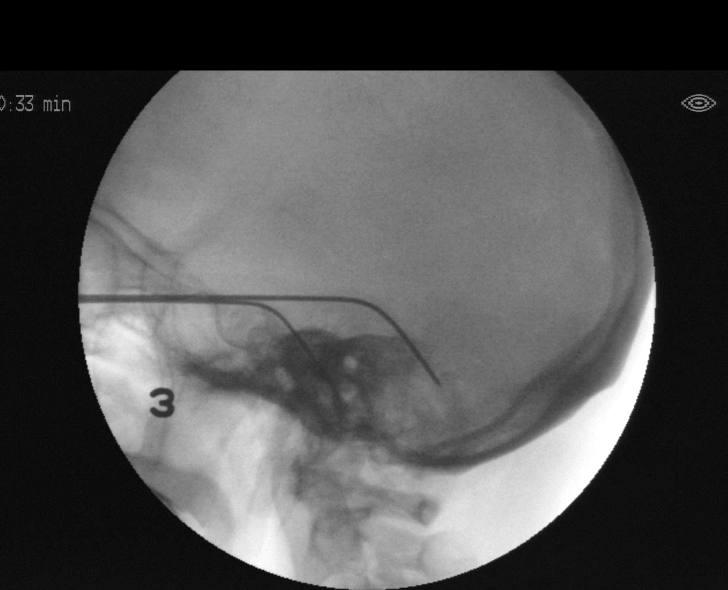

[Series 15: run · 1 of 37 frames shown (8 of 12)]
[frame 6/37]
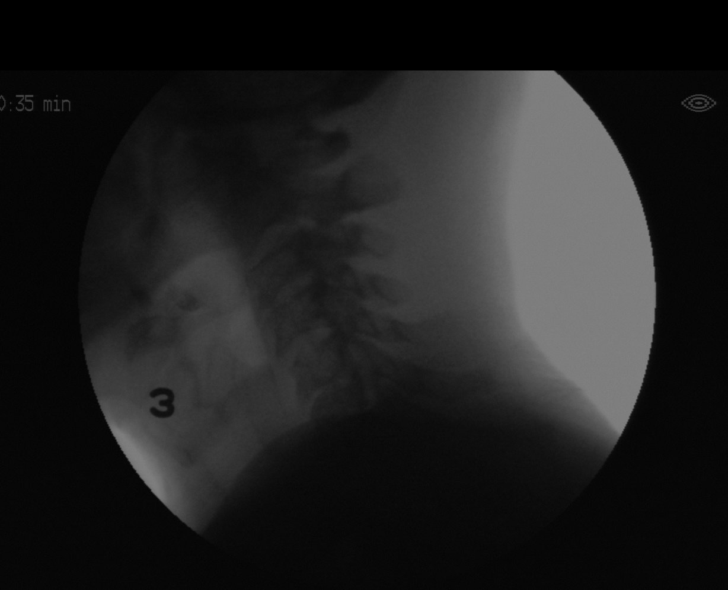

[Series 17: run · 1 of 23 frames shown (9 of 12)]
[frame 4/23]
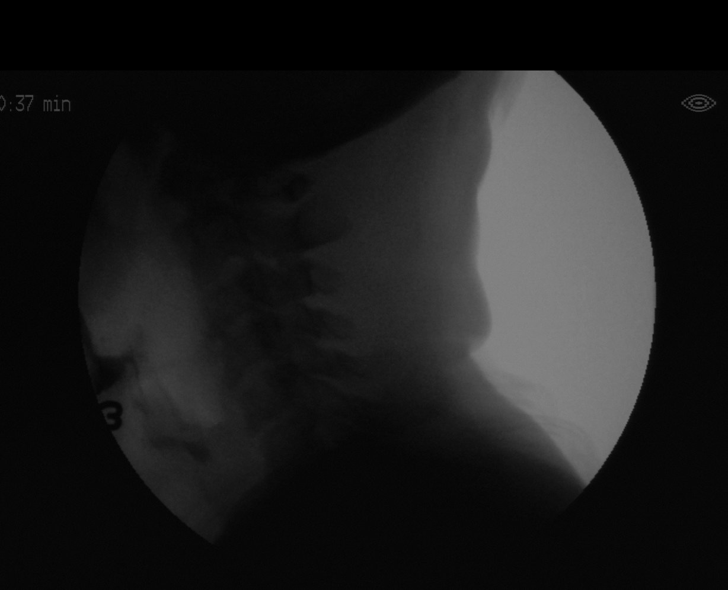

[Series 19: run · 1 of 273 frames shown (10 of 12)]
[frame 41/273]
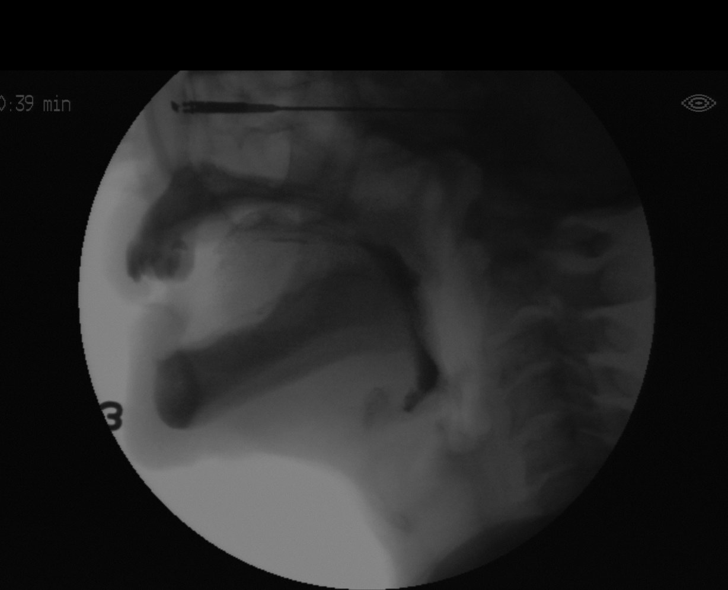

[Series 21: run · 1 of 120 frames shown (11 of 12)]
[frame 11/120]
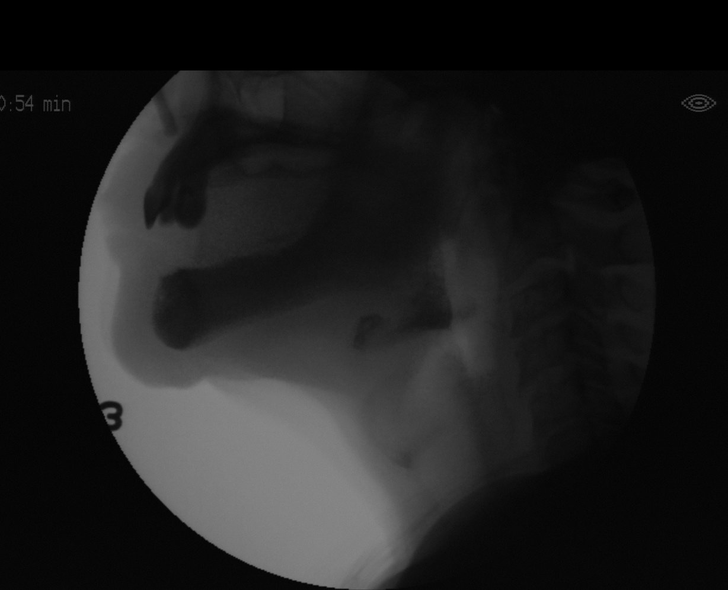

[Series 22: run · 1 of 70 frames shown (12 of 12)]
[frame 60/70]
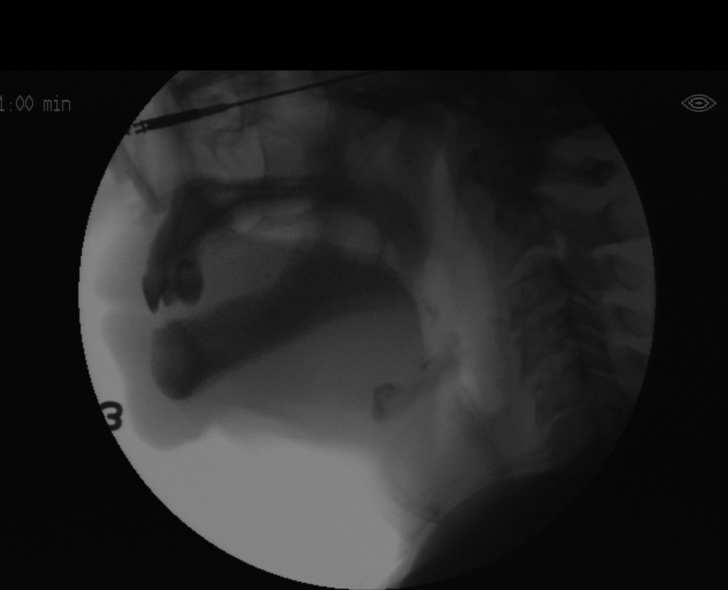

[12 of 24 positions shown; findings below may reference images not displayed]

FINDINGS: Vestibular  Penetration:  None seen.

Aspiration:  None seen.

Other:  None.
IMPRESSION: No penetration or aspiration observed.

Please refer to the Speech Pathologists report for complete details
and recommendations.
